# Patient Record
Sex: Male | Born: 1965 | Race: Black or African American | Hispanic: No | Marital: Married | Smoking: Former smoker
Health system: Southern US, Community
[De-identification: ages and names within clinical notes are randomized; demographics above are authoritative.]

## PROBLEM LIST (undated history)

## (undated) DIAGNOSIS — R51 Headache: Secondary | ICD-10-CM

## (undated) DIAGNOSIS — R519 Headache, unspecified: Secondary | ICD-10-CM

## (undated) DIAGNOSIS — C20 Malignant neoplasm of rectum: Secondary | ICD-10-CM

## (undated) DIAGNOSIS — M25512 Pain in left shoulder: Secondary | ICD-10-CM

## (undated) HISTORY — PX: COLON SURGERY: SHX602

## (undated) HISTORY — PX: ROTATOR CUFF REPAIR: SHX139

---

## 1998-11-12 ENCOUNTER — Encounter: Payer: Self-pay | Admitting: Internal Medicine

## 1998-11-12 ENCOUNTER — Ambulatory Visit (HOSPITAL_COMMUNITY): Admission: RE | Admit: 1998-11-12 | Discharge: 1998-11-12 | Payer: Self-pay | Admitting: Internal Medicine

## 2000-07-24 ENCOUNTER — Encounter: Payer: Self-pay | Admitting: Occupational Medicine

## 2000-07-24 ENCOUNTER — Encounter: Admission: RE | Admit: 2000-07-24 | Discharge: 2000-07-24 | Payer: Self-pay | Admitting: Occupational Medicine

## 2002-05-05 ENCOUNTER — Emergency Department (HOSPITAL_COMMUNITY): Admission: EM | Admit: 2002-05-05 | Discharge: 2002-05-05 | Payer: Self-pay | Admitting: Emergency Medicine

## 2002-05-08 ENCOUNTER — Emergency Department (HOSPITAL_COMMUNITY): Admission: EM | Admit: 2002-05-08 | Discharge: 2002-05-08 | Payer: Self-pay | Admitting: Emergency Medicine

## 2002-05-08 ENCOUNTER — Encounter: Payer: Self-pay | Admitting: Emergency Medicine

## 2002-05-09 ENCOUNTER — Emergency Department (HOSPITAL_COMMUNITY): Admission: EM | Admit: 2002-05-09 | Discharge: 2002-05-09 | Payer: Self-pay | Admitting: Emergency Medicine

## 2002-05-10 ENCOUNTER — Emergency Department (HOSPITAL_COMMUNITY): Admission: EM | Admit: 2002-05-10 | Discharge: 2002-05-10 | Payer: Self-pay | Admitting: Emergency Medicine

## 2002-05-12 ENCOUNTER — Emergency Department (HOSPITAL_COMMUNITY): Admission: EM | Admit: 2002-05-12 | Discharge: 2002-05-12 | Payer: Self-pay | Admitting: Emergency Medicine

## 2002-05-15 ENCOUNTER — Emergency Department (HOSPITAL_COMMUNITY): Admission: EM | Admit: 2002-05-15 | Discharge: 2002-05-15 | Payer: Self-pay | Admitting: Emergency Medicine

## 2004-11-11 ENCOUNTER — Emergency Department (HOSPITAL_COMMUNITY): Admission: EM | Admit: 2004-11-11 | Discharge: 2004-11-11 | Payer: Self-pay | Admitting: Family Medicine

## 2006-07-25 ENCOUNTER — Emergency Department (HOSPITAL_COMMUNITY): Admission: EM | Admit: 2006-07-25 | Discharge: 2006-07-25 | Payer: Self-pay | Admitting: Emergency Medicine

## 2006-09-21 ENCOUNTER — Ambulatory Visit (HOSPITAL_BASED_OUTPATIENT_CLINIC_OR_DEPARTMENT_OTHER): Admission: RE | Admit: 2006-09-21 | Discharge: 2006-09-21 | Payer: Self-pay | Admitting: Orthopedic Surgery

## 2007-04-22 ENCOUNTER — Emergency Department (HOSPITAL_COMMUNITY): Admission: EM | Admit: 2007-04-22 | Discharge: 2007-04-22 | Payer: Self-pay | Admitting: Emergency Medicine

## 2007-09-21 ENCOUNTER — Emergency Department (HOSPITAL_COMMUNITY): Admission: EM | Admit: 2007-09-21 | Discharge: 2007-09-21 | Payer: Self-pay | Admitting: Family Medicine

## 2008-08-10 ENCOUNTER — Emergency Department (HOSPITAL_COMMUNITY): Admission: EM | Admit: 2008-08-10 | Discharge: 2008-08-10 | Payer: Self-pay | Admitting: Emergency Medicine

## 2009-01-23 ENCOUNTER — Emergency Department (HOSPITAL_COMMUNITY): Admission: EM | Admit: 2009-01-23 | Discharge: 2009-01-23 | Payer: Self-pay | Admitting: Emergency Medicine

## 2010-10-25 ENCOUNTER — Encounter: Payer: Self-pay | Admitting: Internal Medicine

## 2010-10-25 ENCOUNTER — Ambulatory Visit: Payer: BC Managed Care – PPO | Admitting: Internal Medicine

## 2010-10-25 DIAGNOSIS — R634 Abnormal weight loss: Secondary | ICD-10-CM

## 2010-10-30 NOTE — Assessment & Plan Note (Signed)
Summary: check for diabetes/jbb   Vital Signs:  Patient Profile:   45 Years Old Male CC:      check for diabetes Height:     69 inches Weight:      166 pounds Pulse rate:   82 / minute Resp:     16 per minute BP sitting:   132 / 88  (left arm)  Pt. in pain?   no                   Updated Prior Medication List: No Medications Current Allergies (reviewed today): ! * SHELLFISHHistory of Present Illness Chief Complaint: check for diabetes History of Present Illness: on eye exam 10 years ago, patient was told he had signs of dm. he had an eye exam last year and dm wasn't mentioned. His family pushed him to get checked since he looks like he's lost weight. He thinks he was 220 lbs 1 yr ago but he is eating less and has more machines vs manual lifting at work. He drinks alot at work but not at home. No polyuria days but avgs nocturia x 3. Overall he feels well with good strength and energy. Works 3rd shift and sleep is often fragmented. Last ate sausage biscuit 6 hrs before cbg.  REVIEW OF SYSTEMS Constitutional Symptoms       Complains of weight loss.     Denies fever, chills, night sweats, weight gain, and fatigue.  Eyes       Denies change in vision, eye pain, eye discharge, glasses, contact lenses, and eye surgery. Ear/Nose/Throat/Mouth       Denies hearing loss/aids, change in hearing, ear pain, ear discharge, dizziness, frequent runny nose, frequent nose bleeds, sinus problems, sore throat, hoarseness, and tooth pain or bleeding.  Respiratory       Denies dry cough, productive cough, wheezing, shortness of breath, asthma, bronchitis, and emphysema/COPD.  Cardiovascular       Denies murmurs, chest pain, and tires easily with exhertion.    Gastrointestinal       Denies stomach pain, nausea/vomiting, diarrhea, constipation, blood in bowel movements, and indigestion. Genitourniary       Denies painful urination, kidney stones, and loss of urinary control. Neurological  Denies paralysis, seizures, and fainting/blackouts. Musculoskeletal       Denies muscle pain, joint pain, joint stiffness, decreased range of motion, redness, swelling, muscle weakness, and gout.  Skin       Denies bruising, unusual mles/lumps or sores, and hair/skin or nail changes.  Psych       Denies mood changes, temper/anger issues, anxiety/stress, speech problems, depression, and sleep problems.  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  Past Medical History: Unremarkable  Past Surgical History: rotator cuff repair 2008. 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 Occupation:  employed Smoking Status:  current Drug Use:  no Physical Exam General appearance: well developed, well nourished, good musculature. no acute distress Head: normocephalic, atraumatic Eyes: conjunctivae and lids normal Nasal: mucosa pink, nonedematous, no septal deviation, turbinates normal Oral/Pharynx: tongue normal, posterior pharynx without erythema or  exudate. good teeth Neck: neck supple,  trachea midline, no masses Thyroid: no nodules, masses, tenderness, or enlargement Chest/Lungs: no rales, wheezes, or rhonchi bilateral, breath sounds equal without effort Heart: regular rate and  rhythm, no murmur Abdomen: soft, non-tender without obvious organomegaly Extremities: normal extremities Neurological: grossly intact and non-focal Back: no tenderness over musculature, straight leg raises negative  bilaterally, deep tendon reflexes 2+ at achilles and patella Skin: no obvious rashes or lesions MSE: oriented to time, place, and person finger stick glucose 98. Assessment New Problems: WEIGHT LOSS (ICD-783.21) FAMILY HISTORY DIABETES 1ST DEGREE RELATIVE (ICD-V18.0)   The patient and/or caregiver has been counseled thoroughly with regard to medications prescribed including dosage, schedule, interactions, rationale for use, and possible side effects and they verbalize understanding.  Diagnoses and expected course of recovery discussed and will return if not improved as expected or if the condition worsens. Patient and/or caregiver verbalized understanding.   Patient Instructions: 1)  food diary. 2)  call here next week and confirm that send out lab is available for further blood work.  Orders Added: 1)  Capillary Blood Glucose/CBG [[id number]]
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
## 2011-05-28 LAB — GC/CHLAMYDIA PROBE AMP, GENITAL
Chlamydia, DNA Probe: NEGATIVE
GC Probe Amp, Genital: NEGATIVE

## 2011-05-28 LAB — POCT URINALYSIS DIP (DEVICE)
Bilirubin Urine: NEGATIVE
Glucose, UA: NEGATIVE
Hgb urine dipstick: NEGATIVE
Ketones, ur: NEGATIVE
Nitrite: NEGATIVE
Operator id: 126491
Protein, ur: NEGATIVE
Specific Gravity, Urine: 1.015
Urobilinogen, UA: 0.2
pH: 6

## 2011-05-28 LAB — URINE CULTURE
Colony Count: NO GROWTH
Culture: NO GROWTH

## 2011-06-23 LAB — I-STAT 8, (EC8 V) (CONVERTED LAB)
Glucose, Bld: 98
Potassium: 4.2
TCO2: 29
pCO2, Ven: 48.1
pH, Ven: 7.365 — ABNORMAL HIGH

## 2011-06-23 LAB — HEPATIC FUNCTION PANEL
ALT: 53
Albumin: 4.1
Indirect Bilirubin: 0.7
Total Protein: 7.3

## 2011-06-23 LAB — GAMMA GT: GGT: 38

## 2012-05-07 ENCOUNTER — Encounter (HOSPITAL_COMMUNITY): Payer: Self-pay | Admitting: Family Medicine

## 2012-05-07 ENCOUNTER — Emergency Department (HOSPITAL_COMMUNITY)
Admission: EM | Admit: 2012-05-07 | Discharge: 2012-05-07 | Disposition: A | Discharge status: Home / Self Care | Payer: BC Managed Care – PPO | Attending: Emergency Medicine | Admitting: Emergency Medicine

## 2012-05-07 ENCOUNTER — Emergency Department (HOSPITAL_COMMUNITY): Payer: BC Managed Care – PPO

## 2012-05-07 DIAGNOSIS — M25519 Pain in unspecified shoulder: Secondary | ICD-10-CM

## 2012-05-07 DIAGNOSIS — M25559 Pain in unspecified hip: Secondary | ICD-10-CM | POA: Insufficient documentation

## 2012-05-07 DIAGNOSIS — F17200 Nicotine dependence, unspecified, uncomplicated: Secondary | ICD-10-CM | POA: Insufficient documentation

## 2012-05-07 DIAGNOSIS — M549 Dorsalgia, unspecified: Secondary | ICD-10-CM

## 2012-05-07 DIAGNOSIS — M545 Low back pain, unspecified: Secondary | ICD-10-CM | POA: Insufficient documentation

## 2012-05-07 MED ORDER — TRAMADOL-ACETAMINOPHEN 37.5-325 MG PO TABS: ORAL_TABLET | ORAL | Status: AC

## 2012-05-07 MED ORDER — CYCLOBENZAPRINE HCL 10 MG PO TABS
ORAL_TABLET | ORAL | Status: AC
  Filled 2012-05-07: qty 1

## 2012-05-07 MED ORDER — METHOCARBAMOL 500 MG PO TABS: ORAL_TABLET | ORAL | Status: AC

## 2012-05-07 MED ORDER — IBUPROFEN 600 MG PO TABS: 600.0000 mg | ORAL_TABLET | Freq: Four times a day (QID) | ORAL | Status: AC | PRN

## 2012-05-07 MED ORDER — CYCLOBENZAPRINE HCL 10 MG PO TABS
10.0000 mg | ORAL_TABLET | Freq: Once | ORAL | Status: AC
  Administered 2012-05-07: 10 mg via ORAL

## 2012-05-07 MED ORDER — KETOROLAC TROMETHAMINE 60 MG/2ML IM SOLN
60.0000 mg | Freq: Once | INTRAMUSCULAR | Status: AC
  Administered 2012-05-07: 60 mg via INTRAMUSCULAR
  Filled 2012-05-07: qty 2
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
## 2013-08-08 ENCOUNTER — Ambulatory Visit: Payer: Self-pay | Admitting: Podiatry

## 2013-08-17 ENCOUNTER — Encounter: Payer: Self-pay | Admitting: Podiatry

## 2013-08-17 ENCOUNTER — Ambulatory Visit (INDEPENDENT_AMBULATORY_CARE_PROVIDER_SITE_OTHER): Payer: BC Managed Care – PPO | Admitting: Podiatry

## 2013-08-17 VITALS — BP 145/89 | HR 84 | Resp 12 | Ht 68.0 in | Wt 190.0 lb

## 2013-08-17 DIAGNOSIS — L84 Corns and callosities: Secondary | ICD-10-CM

## 2013-08-17 DIAGNOSIS — L259 Unspecified contact dermatitis, unspecified cause: Secondary | ICD-10-CM

## 2013-08-17 NOTE — Progress Notes (Signed)
   Subjective:    Patient ID: Barry [name], male    DOB: 05-04-1966, 47 y.o.   MRN: [id number]  HPI    Review of Systems  Allergic/Immunologic: Positive for food allergies.  All other systems reviewed and are negative.       Objective:   Physical Exam        Assessment & Plan:

## 2013-08-17 NOTE — Progress Notes (Signed)
Subjective:     Patient ID: Barry [name], male   DOB: [birth year], 47 y.o.   MRN: [id number]  HPI patient states that I have dry scaly feet of both feet and that I have these lesions on my right big toe and first metatarsal which become very tender   Review of Systems  All other systems reviewed and are negative.       Objective:   Physical Exam  Nursing note and vitals reviewed. Constitutional: He is oriented to person, place, and time.  Cardiovascular: Intact distal pulses.   Musculoskeletal: Normal range of motion.  Neurological: He is oriented to person, place, and time.  Skin: Skin is warm.   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    Plan:     Reviewed H&P and condition and recommended Vaseline with saran wrap and white socks along with revitaderm and today deep debridement of lesions accomplished with no iatrogenic bleeding noted reappoint her recheck in 3 and

## 2013-08-17 NOTE — Patient Instructions (Signed)
vaseline with saran wrap and white socks two nights a week and use revitaderm remaining nights

## 2013-08-17 NOTE — Progress Notes (Signed)
N- SORE, DRY AND THICK SKIN L-B/L FEET D-LONG TERM O-SLOWLY C-WORSE A-N/A T-SOAKING WITH EPSON SALT, LOTION

## 2013-08-19 DIAGNOSIS — L259 Unspecified contact dermatitis, unspecified cause: Secondary | ICD-10-CM

## 2013-10-17 ENCOUNTER — Ambulatory Visit: Payer: BC Managed Care – PPO | Admitting: Podiatry

## 2014-11-18 ENCOUNTER — Emergency Department (HOSPITAL_COMMUNITY)
Admission: EM | Admit: 2014-11-18 | Discharge: 2014-11-19 | Disposition: A | Discharge status: Home / Self Care | Payer: BLUE CROSS/BLUE SHIELD | Attending: Emergency Medicine | Admitting: Emergency Medicine

## 2014-11-18 ENCOUNTER — Emergency Department: Payer: Self-pay | Admitting: Emergency Medicine

## 2014-11-18 ENCOUNTER — Emergency Department (HOSPITAL_COMMUNITY): Payer: BLUE CROSS/BLUE SHIELD

## 2014-11-18 ENCOUNTER — Encounter (HOSPITAL_COMMUNITY): Payer: Self-pay

## 2014-11-18 DIAGNOSIS — R51 Headache: Secondary | ICD-10-CM | POA: Diagnosis not present

## 2014-11-18 DIAGNOSIS — Z720 Tobacco use: Secondary | ICD-10-CM | POA: Insufficient documentation

## 2014-11-18 DIAGNOSIS — R519 Headache, unspecified: Secondary | ICD-10-CM

## 2014-11-18 NOTE — ED Provider Notes (Signed)
CSN: [id number]     Arrival date & time 11/18/14  2103 History  This chart was scribed for Linton Flemings, MD by Chester Holstein, ED Scribe. This patient was seen in room A09C/A09C and the patient's care was started at 12:16 AM.    Chief Complaint  Patient presents with  . Headache    Patient is a 50 y.o. male presenting with headaches. The history is provided by the patient. No language interpreter was used.  Headache Associated symptoms: eye pain and fatigue   Associated symptoms: no dizziness, no fever and no weakness    HPI Comments: Barry [name] is a 49 y.o. male who presents to the Emergency Department complaining of intermittent left parietal headache with onset 2 days ago. Pt denies pain currently.  Pt notes associated burning left eye pain with watering and redness, drowsiness, and chills.  Pt has taken ibuprofen for relief. Pt is a smoker 1 ppd. Pt denies FHx of headaches. Pt denies known injury to left eye, fever, visual disturbances, weakness, and dizziness. Pt has no PCP.   No past medical history on file. Past Surgical History  Procedure Laterality Date  . Rotator cuff repair Left    No family history on file. History  Substance Use Topics  . Smoking status: Current Every Day Smoker    Types: Cigars  . Smokeless tobacco: Not on file  . Alcohol Use: Yes     Comment: 1 beer every day     Review of Systems  Constitutional: Positive for chills and fatigue. Negative for fever.  Eyes: Positive for pain, discharge (tearing) and redness. Negative for visual disturbance.  Neurological: Positive for headaches. Negative for dizziness and weakness.      Allergies  Shellfish allergy  Home Medications   Prior to Admission medications   Not on File   BP 125/95 mmHg  Pulse 88  Temp(Src) 98.3 F (36.8 C) (Oral)  Resp 22  Ht 5\' 8"  (1.727 m)  Wt 166 lb (75.297 kg)  BMI 25.25 kg/m2  SpO2 100% Physical Exam  Constitutional: He is oriented to person, place, and time. He  appears well-developed and well-nourished.  HENT:  Head: Normocephalic and atraumatic.  Nose: Nose normal.  Mouth/Throat: Oropharynx is clear and moist.  Eyes: Conjunctivae and EOM are normal. Pupils are equal, round, and reactive to light.  Neck: Normal range of motion. Neck supple. No JVD present. No tracheal deviation present. No thyromegaly present.  Cardiovascular: Normal rate, regular rhythm, normal heart sounds and intact distal pulses.  Exam reveals no gallop and no friction rub.   No murmur heard. Pulmonary/Chest: Effort normal and breath sounds normal. No stridor. No respiratory distress. He has no wheezes. He has no rales. He exhibits no tenderness.  Abdominal: Soft. Bowel sounds are normal. He exhibits no distension and no mass. There is no tenderness. There is no rebound and no guarding.  Musculoskeletal: Normal range of motion. He exhibits no edema or tenderness.  Lymphadenopathy:    He has no cervical adenopathy.  Neurological: He is alert and oriented to person, place, and time. He displays normal reflexes. No cranial nerve deficit. He exhibits normal muscle tone. Coordination normal.  Skin: Skin is warm and dry. No rash noted. No erythema. No pallor.  Psychiatric: He has a normal mood and affect. His behavior is normal. Judgment and thought content normal.  Nursing note and vitals reviewed.   ED Course  Procedures (including critical care time) DIAGNOSTIC STUDIES: Oxygen Saturation is 100% on  room air, normal by my interpretation.    COORDINATION OF CARE: 12:23 AM Discussed treatment plan with patient at beside, the patient agrees with the plan and has no further questions at this time.   Labs Review Labs Reviewed - No data to display  Imaging Review Ct Head Wo Contrast  11/18/2014   CLINICAL DATA:  Acute onset of headache.  Initial encounter.  EXAM: CT HEAD WITHOUT CONTRAST  TECHNIQUE: Contiguous axial images were obtained from the base of the skull through the  vertex without intravenous contrast.  COMPARISON:  None.  FINDINGS: There is no evidence of acute infarction, mass lesion, or intra- or extra-axial hemorrhage on CT.  The posterior fossa, including the cerebellum, brainstem and fourth ventricle, is within normal limits. The third and lateral ventricles, and basal ganglia are unremarkable in appearance. The cerebral hemispheres are symmetric in appearance, with normal gray-white differentiation. No mass effect or midline shift is seen.  There is no evidence of fracture; visualized osseous structures are unremarkable in appearance. The visualized portions of the orbits are within normal limits. The paranasal sinuses and mastoid air cells are well-aerated. No significant soft tissue abnormalities are seen.  IMPRESSION: Unremarkable noncontrast CT of the head.   Electronically Signed   By: Garald Balding M.D.   On: 11/18/2014 22:08     EKG Interpretation None      MDM   Final diagnoses:  Headache in back of head   49 year old male with intermittent headache for the last 3 days.  By his description, headaches may be cluster given.  The eye pain and eye watering.  Eye exam is normal without signs of lesions to indicate shingles.  Patient is currently symptom free.  We'll refer him to a primary care doctor.  Will prescribe Fioricet for use in the meantime. I personally performed the services described in this documentation, which was scribed in my presence. The recorded information has been reviewed and is accurate.      Linton Flemings, MD 11/19/14 [phone]

## 2014-11-18 NOTE — ED Notes (Signed)
Pt reports h/a for a few days,  Pt currently in no pain, no neurological deficits.  Pt alert and oriented.

## 2014-11-18 NOTE — ED Notes (Signed)
Onset 3 days intermittant left sided headache, blurred vision.  No other s/s noted.  Took IBU 800 mg @ 8pm.

## 2014-11-19 MED ORDER — BUTALBITAL-APAP-CAFFEINE 50-325-40 MG PO TABS: 1.0000 | ORAL_TABLET | Freq: Four times a day (QID) | ORAL | Status: AC | PRN

## 2014-11-19 NOTE — Discharge Instructions (Signed)
General Headache Without Cause A general headache is pain or discomfort felt around the head or neck area. The cause may not be found.  HOME CARE   Keep all doctor visits.  Only take medicines as told by your doctor.  Lie down in a dark, quiet room when you have a headache.  Keep a journal to find out if certain things bring on headaches. For example, write down:  What you eat and drink.  How much sleep you get.  Any change to your diet or medicines.  Relax by getting a massage or doing other relaxing activities.  Put ice or heat packs on the head and neck area as told by your doctor.  Lessen stress.  Sit up straight. Do not tighten (tense) your muscles.  Quit smoking if you smoke.  Lessen how much alcohol you drink.  Lessen how much caffeine you drink, or stop drinking caffeine.  Eat and sleep on a regular schedule.  Get 7 to 9 hours of sleep, or as told by your doctor.  Keep lights dim if bright lights bother you or make your headaches worse. GET HELP RIGHT AWAY IF:   Your headache becomes really bad.  You have a fever.  You have a stiff neck.  You have trouble seeing.  Your muscles are weak, or you lose muscle control.  You lose your balance or have trouble walking.  You feel like you will pass out (faint), or you pass out.  You have really bad symptoms that are different than your first symptoms.  You have problems with the medicines given to you by your doctor.  Your medicines do not work.  Your headache feels different than the other headaches.  You feel sick to your stomach (nauseous) or throw up (vomit). MAKE SURE YOU:   Understand these instructions.  Will watch your condition.  Will get help right away if you are not doing well or get worse. Document Released: 06/03/2008 Document Revised: 11/17/2011 Document Reviewed: 08/15/2011 [facility] Patient Information 2015 [place], [place]. This information is not intended to replace advice given to  you by your health care provider. Make sure you discuss any questions you have with your health care provider.  Headaches, Frequently Asked Questions MIGRAINE HEADACHES Q: What is migraine? What causes it? How can I treat it? A: Generally, migraine headaches begin as a dull ache. Then they develop into a constant, throbbing, and pulsating pain. You may experience pain at the temples. You may experience pain at the front or back of one or both sides of the head. The pain is usually accompanied by a combination of:  Nausea.  Vomiting.  Sensitivity to light and noise. Some people (about 15%) experience an aura (see below) before an attack. The cause of migraine is believed to be chemical reactions in the brain. Treatment for migraine may include over-the-counter or prescription medications. It may also include self-help techniques. These include relaxation training and biofeedback.  Q: What is an aura? A: About 15% of people with migraine get an "aura". This is a sign of neurological symptoms that occur before a migraine headache. You may see wavy or jagged lines, dots, or flashing lights. You might experience tunnel vision or blind spots in one or both eyes. The aura can include visual or auditory hallucinations (something imagined). It may include disruptions in smell (such as strange odors), taste or touch. Other symptoms include:  Numbness.  A "pins and needles" sensation.  Difficulty in recalling or speaking the  correct word. These neurological events may last as long as 60 minutes. These symptoms will fade as the headache begins. Q: What is a trigger? A: Certain physical or environmental factors can lead to or "trigger" a migraine. These include:  Foods.  Hormonal changes.  Weather.  Stress. It is important to remember that triggers are different for everyone. To help prevent migraine attacks, you need to figure out which triggers affect you. Keep a headache diary. This is a good  way to track triggers. The diary will help you talk to your healthcare professional about your condition. Q: Does weather affect migraines? A: Bright sunshine, hot, humid conditions, and drastic changes in barometric pressure may lead to, or "trigger," a migraine attack in some people. But studies have shown that weather does not act as a trigger for everyone with migraines. Q: What is the link between migraine and hormones? A: Hormones start and regulate many of your body's functions. Hormones keep your body in balance within a constantly changing environment. The levels of hormones in your body are unbalanced at times. Examples are during menstruation, pregnancy, or menopause. That can lead to a migraine attack. In fact, about three quarters of all women with migraine report that their attacks are related to the menstrual cycle.  Q: Is there an increased risk of stroke for migraine sufferers? A: The likelihood of a migraine attack causing a stroke is very remote. That is not to say that migraine sufferers cannot have a stroke associated with their migraines. In persons under age 48, the most common associated factor for stroke is migraine headache. But over the course of a person's normal life span, the occurrence of migraine headache may actually be associated with a reduced risk of dying from cerebrovascular disease due to stroke.  Q: What are acute medications for migraine? A: Acute medications are used to treat the pain of the headache after it has started. Examples over-the-counter medications, NSAIDs, ergots, and triptans.  Q: What are the triptans? A: Triptans are the newest class of abortive medications. They are specifically targeted to treat migraine. Triptans are vasoconstrictors. They moderate some chemical reactions in the brain. The triptans work on receptors in your brain. Triptans help to restore the balance of a neurotransmitter called serotonin. Fluctuations in levels of serotonin are  thought to be a main cause of migraine.  Q: Are over-the-counter medications for migraine effective? A: Over-the-counter, or "OTC," medications may be effective in relieving mild to moderate pain and associated symptoms of migraine. But you should see your caregiver before beginning any treatment regimen for migraine.  Q: What are preventive medications for migraine? A: Preventive medications for migraine are sometimes referred to as "prophylactic" treatments. They are used to reduce the frequency, severity, and length of migraine attacks. Examples of preventive medications include antiepileptic medications, antidepressants, beta-blockers, calcium channel blockers, and NSAIDs (nonsteroidal anti-inflammatory drugs). Q: Why are anticonvulsants used to treat migraine? A: During the past few years, there has been an increased interest in antiepileptic drugs for the prevention of migraine. They are sometimes referred to as "anticonvulsants". Both epilepsy and migraine may be caused by similar reactions in the brain.  Q: Why are antidepressants used to treat migraine? A: Antidepressants are typically used to treat people with depression. They may reduce migraine frequency by regulating chemical levels, such as serotonin, in the brain.  Q: What alternative therapies are used to treat migraine? A: The term "alternative therapies" is often used to describe treatments considered outside the  scope of conventional Western medicine. Examples of alternative therapy include acupuncture, acupressure, and yoga. Another common alternative treatment is herbal therapy. Some herbs are believed to relieve headache pain. Always discuss alternative therapies with your caregiver before proceeding. Some herbal products contain arsenic and other toxins. TENSION HEADACHES Q: What is a tension-type headache? What causes it? How can I treat it? A: Tension-type headaches occur randomly. They are often the result of temporary stress,  anxiety, fatigue, or anger. Symptoms include soreness in your temples, a tightening band-like sensation around your head (a "vice-like" ache). Symptoms can also include a pulling feeling, pressure sensations, and contracting head and neck muscles. The headache begins in your forehead, temples, or the back of your head and neck. Treatment for tension-type headache may include over-the-counter or prescription medications. Treatment may also include self-help techniques such as relaxation training and biofeedback. CLUSTER HEADACHES Q: What is a cluster headache? What causes it? How can I treat it? A: Cluster headache gets its name because the attacks come in groups. The pain arrives with little, if any, warning. It is usually on one side of the head. A tearing or bloodshot eye and a runny nose on the same side of the headache may also accompany the pain. Cluster headaches are believed to be caused by chemical reactions in the brain. They have been described as the most severe and intense of any headache type. Treatment for cluster headache includes prescription medication and oxygen. SINUS HEADACHES Q: What is a sinus headache? What causes it? How can I treat it? A: When a cavity in the bones of the face and skull (a sinus) becomes inflamed, the inflammation will cause localized pain. This condition is usually the result of an allergic reaction, a tumor, or an infection. If your headache is caused by a sinus blockage, such as an infection, you will probably have a fever. An x-ray will confirm a sinus blockage. Your caregiver's treatment might include antibiotics for the infection, as well as antihistamines or decongestants.  REBOUND HEADACHES Q: What is a rebound headache? What causes it? How can I treat it? A: A pattern of taking acute headache medications too often can lead to a condition known as "rebound headache." A pattern of taking too much headache medication includes taking it more than 2 days per  week or in excessive amounts. That means more than the label or a caregiver advises. With rebound headaches, your medications not only stop relieving pain, they actually begin to cause headaches. Doctors treat rebound headache by tapering the medication that is being overused. Sometimes your caregiver will gradually substitute a different type of treatment or medication. Stopping may be a challenge. Regularly overusing a medication increases the potential for serious side effects. Consult a caregiver if you regularly use headache medications more than 2 days per week or more than the label advises. ADDITIONAL QUESTIONS AND ANSWERS Q: What is biofeedback? A: Biofeedback is a self-help treatment. Biofeedback uses special equipment to monitor your body's involuntary physical responses. Biofeedback monitors:  Breathing.  Pulse.  Heart rate.  Temperature.  Muscle tension.  Brain activity. Biofeedback helps you refine and perfect your relaxation exercises. You learn to control the physical responses that are related to stress. Once the technique has been mastered, you do not need the equipment any more. Q: Are headaches hereditary? A: Four out of five (80%) of people that suffer report a family history of migraine. Scientists are not sure if this is genetic or a family predisposition. Despite  the uncertainty, a child has a 50% chance of having migraine if one parent suffers. The child has a 75% chance if both parents suffer.  Q: Can children get headaches? A: By the time they reach high school, most young people have experienced some type of headache. Many safe and effective approaches or medications can prevent a headache from occurring or stop it after it has begun.  Q: What type of doctor should I see to diagnose and treat my headache? A: Start with your primary caregiver. Discuss his or her experience and approach to headaches. Discuss methods of classification, diagnosis, and treatment. Your  caregiver may decide to recommend you to a headache specialist, depending upon your symptoms or other physical conditions. Having diabetes, allergies, etc., may require a more comprehensive and inclusive approach to your headache. The National Headache Foundation will provide, upon request, a list of [facility] physician members in your state. Document Released: 11/15/2003 Document Revised: 11/17/2011 Document Reviewed: 04/24/2008 [facility] Patient Information 2015 [place], [place]. This information is not intended to replace advice given to you by your health care provider. Make sure you discuss any questions you have with your health care provider.  Emergency Department Resource Guide 1) Find a Doctor and Pay Out of Pocket Although you won't have to find out who is covered by your insurance plan, it is a good idea to ask around and get recommendations. You will then need to call the office and see if the doctor you have chosen will accept you as a new patient and what types of options they offer for patients who are self-pay. Some doctors offer discounts or will set up payment plans for their patients who do not have insurance, but you will need to ask so you aren't surprised when you get to your appointment.  2) Contact Your Local Health Department Not all health departments have doctors that can see patients for sick visits, but many do, so it is worth a call to see if yours does. If you don't know where your local health department is, you can check in your phone book. The CDC also has a tool to help you locate your state's health department, and many state websites also have listings of all of their local health departments.  3) Find a [facility] If your illness is not likely to be very severe or complicated, you may want to try a walk in clinic. These are popping up all over the country in pharmacies, drugstores, and shopping centers. They're usually staffed by nurse practitioners or physician  assistants that have been trained to treat common illnesses and complaints. They're usually fairly quick and inexpensive. However, if you have serious medical issues or chronic medical problems, these are probably not your best option.  No Primary Care Doctor: - Call Health Connect at  [phone] - they can help you locate a primary care doctor that  accepts your insurance, provides certain services, etc. - Physician Referral Service- [phone]  Chronic Pain Problems: Organization         Address  Phone   Notes  [facility]  [phone] Patients need to be referred by their primary care doctor.   Medication Assistance: Organization         Address  Phone   Notes  [facility] Medication [facility] [address] [place], [place] [address] [phone] --Must be a resident of [facility] -- Must have NO insurance coverage whatsoever (no Medicaid/ Medicare,  etc.) -- The pt. MUST have a primary care doctor that directs their care regularly and follows them in the community   MedAssist  [phone]   [employer]  [phone]    Agencies that provide inexpensive medical care: Organization         Address  Phone   Notes  [place]  [phone]   [facility]    [phone]   [facility] [place], [place] [address] [phone]   [place] [address] [place] [phone]   Planned Parenthood    [phone]   [facility]    [phone]   [place] and [place] [address], [place] Phone:  [phone], Fax:  [phone] Hours of Operation:  9 am - 6 pm, M-F.  Also accepts Medicaid/Medicare and self-pay.  [facility] for [place] [address], [address], [place] Phone: [phone], Fax: [phone]. Hours of Operation:  8:30 am - 5:30 pm, M-F.  Also accepts  Medicaid and self-pay.  [facility] [place] [address] [place] Phone: [phone]   [place], [place], [place] [phone], Ext. [phone] Mondays & Thursdays: 7-9 AM.  First 15 patients are seen on a first come, first serve basis.    [place] Providers:  Organization         Address  Phone   Notes  [facility] [address],  [phone] Also accepts self-pay patients.  [facility] [address], [place]  [phone]   [place], [address], [place] [phone]   [facility] [address], [place] [phone]   Lucianne Lei [address], [place]   [phone] Only accepts [place] Access [place] patients after they have their name applied to their card.   Self-Pay (no insurance) in [facility]:  Organization         Address  Phone   Notes  Sickle Cell Patients, [facility] [place] [phone]   [facility] Urgent Care [place] [phone]   [facility] [place]  [place], [place], [place] [phone]   [facility]/Dr. Osei-Bonsu  [address], [place] or [address], [place] [phone] Phone number for both [place] and [place] locations is the same.  [facility] [address] [place] [phone]   [facility] [address], [place] or [address] [phone]   [facility] [address], [place] [phone], phone; [phone], fax Sees patients 1st and 3rd Saturday of every month.  Must not qualify for public or private insurance (i.e. Medicaid, Medicare, [place] Health Choice, Veterans' Benefits)  Household  income should be no more than 200% of the poverty level The clinic cannot treat you if you are pregnant or think you are pregnant  Sexually transmitted diseases are not treated at the clinic.    Dental Care: Organization         Address  Phone  Notes  [facility] [phone] Accepts children up to age 15 who are enrolled in [place] or [place]; pregnant women with a Medicaid card; and children who have applied for Medicaid or [place] Health Choice, but were declined, whose parents can pay a reduced fee at time of service.  [facility] Department of [facility]  [address], [place] [phone] Accepts children up to age 57 who are enrolled in [place] or [place]; pregnant women with a Medicaid card; and children who have applied for Medicaid or [place] Health Choice, but were declined, whose parents can pay a reduced fee at time of service.  [facility] Adult Dental Access PROGRAM  [place] [phone] Patients are seen by appointment only. Walk-ins are not accepted. [place] will see patients 57 years of age and older. Monday - Tuesday (8am-5pm) Most Wednesdays (8:30-5pm) $30 per visit, cash only  [facility] Adult Dental Access PROGRAM  [address], [facility] [phone] Patients are seen by appointment only. Walk-ins are not accepted. [place] will see patients 43 years of age and older. One Wednesday Evening (Monthly: Volunteer Based).  $30 per visit, cash only  [place]  [phone] for adults; Children under age 40, call Graduate Pediatric Dentistry at [phone]. Children aged 78-14, please call [phone] to request a pediatric application.  Dental services are provided in all areas of dental care including fillings, crowns and bridges, complete and partial dentures, implants, gum  treatment, root canals, and extractions. Preventive care is also provided. Treatment is provided to both adults and children. Patients are selected via a lottery and there is often a waiting list.   [facility] [address] [place]  [phone] [email or web address]   [facility] [address] [place], [place] [phone], Ext. [phone] Second and Fourth Thursday of each month, opens at 6:30 AM; Clinic ends at 9 AM.  Patients are seen on a first-come first-served basis, and a limited number are seen during each clinic.   [facility]  [address] Hillard Danker [place], [place] [phone]   Eligibility Requirements You must have lived in [place], [place], or [place] counties for at least the last three months.   You cannot be eligible for state or federal sponsored [employer], including [employer], [place], or [employer].   You generally cannot be eligible for healthcare insurance through your employer.    How to apply: Eligibility screenings are held every Tuesday and Wednesday afternoon from 1:00 pm until 4:00 pm. You do not need an appointment for the interview!  [facility] [address] [place], [place]   [place]  [place] Department  [place]  [phone]    Behavioral Health Resources in the Community: Intensive Outpatient Programs Organization         Address  Phone  Notes  [place]. [address] [place], [place] [phone]   [facility] Outpatient [address] [place], [place]   ADS: [facility] Drug [facility] [address] [place], [place]   [place] [address]  [place], [place] or [phone]   Substance Abuse Resources Organization         Address  Phone  Notes  Alcohol and  Drug Services  [place]  [phone]   [facility] [place]   Chinita Pester  [phone]   Residential & Outpatient Substance Abuse Program  [phone]   Psychological Services Organization         Address  Phone  Notes  [facility]  [place]  [phone]   [place] [address], [place] or [phone]    Mobile Crisis Teams Organization         Address  Phone  Notes  [facility], Mobile Crisis Care Unit  [phone]   [facility]  [address] [place], [place]   [place] [address] [place] [phone]    Self-Help/Support Groups Organization         Address  Phone             Notes  [place]. of [place] - variety of support groups  [place] Call for more information  Narcotics Anonymous (NA), [facility] [address], [employer] [place]  2 meetings at this location   Special educational needs teacher         Address  Phone  Notes  [facility] [place],    [place]  [phone]   [facility]  [address], [place] [phone], [place], [place]   Concepcion Hinckley, [place] [phone] Admissions: 8am-3pm M-F  Incentives Substance [place] [address]    [place], [place] [phone]   [facility] [address] [place], [place], [place]   [facility] [address],  [place], [place]   [facility] - Intensive Outpatient [address] Kristeen [address], [place], [place]   [facility] [place].,  [place], [place] [phone] or [phone]   [facility] Services (RTS) [address] [place], [place] Accepts Medicaid  [facility]ship  [place] [address]  [place] [phone] Substance Abuse/Addiction Treatment   [facility] Organization         Address  Phone  Notes  [facility]  [phone]   Domenic Schwab, PhD [address] Arlis Porta [place], [place]   [phone] or [phone]   [place], [place] [phone]   [facility] [address] [place], [place] [phone] Insurance/Medicaid/sponsorship through [facility] [address] [place]                                    [place], [place] [phone] [place] [address], [place] [phone]    Dr. Adele Schilder  [phone]   [facility] [place] Dept. 1) [address] [place] 2) [place] 3)  [place] [address], [place] [phone]  [phone]   [place] [phone] or [phone] (After Hours)

## 2014-11-19 NOTE — ED Notes (Signed)
Pt made aware to return if symptoms worsen or if any life threatening symptoms occur.   

## 2015-01-15 ENCOUNTER — Encounter (HOSPITAL_COMMUNITY): Payer: Self-pay

## 2015-01-15 ENCOUNTER — Emergency Department (HOSPITAL_COMMUNITY): Payer: BLUE CROSS/BLUE SHIELD

## 2015-01-15 ENCOUNTER — Emergency Department (HOSPITAL_COMMUNITY)
Admission: EM | Admit: 2015-01-15 | Discharge: 2015-01-15 | Disposition: A | Discharge status: Home / Self Care | Payer: BLUE CROSS/BLUE SHIELD | Attending: Emergency Medicine | Admitting: Emergency Medicine

## 2015-01-15 DIAGNOSIS — X58XXXA Exposure to other specified factors, initial encounter: Secondary | ICD-10-CM | POA: Insufficient documentation

## 2015-01-15 DIAGNOSIS — Y9389 Activity, other specified: Secondary | ICD-10-CM | POA: Diagnosis not present

## 2015-01-15 DIAGNOSIS — T184XXA Foreign body in colon, initial encounter: Secondary | ICD-10-CM

## 2015-01-15 DIAGNOSIS — R109 Unspecified abdominal pain: Secondary | ICD-10-CM

## 2015-01-15 DIAGNOSIS — Z8739 Personal history of other diseases of the musculoskeletal system and connective tissue: Secondary | ICD-10-CM | POA: Insufficient documentation

## 2015-01-15 DIAGNOSIS — Y998 Other external cause status: Secondary | ICD-10-CM | POA: Insufficient documentation

## 2015-01-15 DIAGNOSIS — R1084 Generalized abdominal pain: Secondary | ICD-10-CM | POA: Diagnosis present

## 2015-01-15 DIAGNOSIS — Z720 Tobacco use: Secondary | ICD-10-CM | POA: Diagnosis not present

## 2015-01-15 DIAGNOSIS — Y9289 Other specified places as the place of occurrence of the external cause: Secondary | ICD-10-CM | POA: Insufficient documentation

## 2015-01-15 HISTORY — DX: Headache: R51

## 2015-01-15 HISTORY — DX: Pain in left shoulder: M25.512

## 2015-01-15 HISTORY — DX: Headache, unspecified: R51.9

## 2015-01-15 LAB — CBC WITH DIFFERENTIAL/PLATELET
BASOS PCT: 0 % (ref 0–1)
Basophils Absolute: 0 10*3/uL (ref 0.0–0.1)
EOS ABS: 0.1 10*3/uL (ref 0.0–0.7)
EOS PCT: 2 % (ref 0–5)
HCT: 41.4 % (ref 39.0–52.0)
HEMOGLOBIN: 14.5 g/dL (ref 13.0–17.0)
Lymphocytes Relative: 39 % (ref 12–46)
Lymphs Abs: 2.1 10*3/uL (ref 0.7–4.0)
MCH: 31 pg (ref 26.0–34.0)
MCHC: 35 g/dL (ref 30.0–36.0)
MCV: 88.5 fL (ref 78.0–100.0)
MONOS PCT: 10 % (ref 3–12)
Monocytes Absolute: 0.5 10*3/uL (ref 0.1–1.0)
NEUTROS PCT: 49 % (ref 43–77)
Neutro Abs: 2.6 10*3/uL (ref 1.7–7.7)
Platelets: 151 10*3/uL (ref 150–400)
RBC: 4.68 MIL/uL (ref 4.22–5.81)
RDW: 13.5 % (ref 11.5–15.5)
WBC: 5.4 10*3/uL (ref 4.0–10.5)

## 2015-01-15 LAB — URINALYSIS W MICROSCOPIC (NOT AT ARMC)
Bilirubin Urine: NEGATIVE
Glucose, UA: NEGATIVE mg/dL
Hgb urine dipstick: NEGATIVE
KETONES UR: NEGATIVE mg/dL
LEUKOCYTES UA: NEGATIVE
NITRITE: NEGATIVE
PROTEIN: NEGATIVE mg/dL
Specific Gravity, Urine: 1.014 (ref 1.005–1.030)
UROBILINOGEN UA: 0.2 mg/dL (ref 0.0–1.0)
pH: 6 (ref 5.0–8.0)

## 2015-01-15 LAB — COMPREHENSIVE METABOLIC PANEL
ALK PHOS: 80 U/L (ref 38–126)
ALT: 34 U/L (ref 17–63)
AST: 22 U/L (ref 15–41)
Albumin: 4.1 g/dL (ref 3.5–5.0)
Anion gap: 3 — ABNORMAL LOW (ref 5–15)
BUN: 12 mg/dL (ref 6–20)
CALCIUM: 8.9 mg/dL (ref 8.9–10.3)
CHLORIDE: 109 mmol/L (ref 101–111)
CO2: 27 mmol/L (ref 22–32)
CREATININE: 1.01 mg/dL (ref 0.61–1.24)
GLUCOSE: 94 mg/dL (ref 70–99)
Potassium: 3.7 mmol/L (ref 3.5–5.1)
Sodium: 139 mmol/L (ref 135–145)
Total Bilirubin: 0.5 mg/dL (ref 0.3–1.2)
Total Protein: 7.3 g/dL (ref 6.5–8.1)

## 2015-01-15 LAB — LIPASE, BLOOD: LIPASE: 19 U/L — AB (ref 22–51)

## 2015-01-15 MED ORDER — MORPHINE SULFATE 4 MG/ML IJ SOLN
4.0000 mg | INTRAMUSCULAR | Status: DC | PRN
  Filled 2015-01-15: qty 1

## 2015-01-15 MED ORDER — IOHEXOL 300 MG/ML  SOLN
50.0000 mL | Freq: Once | INTRAMUSCULAR | Status: AC | PRN
  Administered 2015-01-15: 50 mL via ORAL

## 2015-01-15 MED ORDER — ONDANSETRON HCL 4 MG PO TABS: 4.0000 mg | ORAL_TABLET | Freq: Three times a day (TID) | ORAL | Status: DC | PRN

## 2015-01-15 MED ORDER — IOHEXOL 300 MG/ML  SOLN
100.0000 mL | Freq: Once | INTRAMUSCULAR | Status: AC | PRN
  Administered 2015-01-15: 100 mL via INTRAVENOUS

## 2015-01-15 MED ORDER — SODIUM CHLORIDE 0.9 % IV SOLN
INTRAVENOUS | Status: DC
  Administered 2015-01-15: 09:00:00 via INTRAVENOUS

## 2015-01-15 MED ORDER — ONDANSETRON HCL 4 MG/2ML IJ SOLN
4.0000 mg | INTRAMUSCULAR | Status: DC | PRN
  Filled 2015-01-15: qty 2

## 2015-01-15 NOTE — Consult Note (Signed)
Reason for Consult:  Abdominal pain  Referring Physician: Dr. Francine Graven  JAHAAN [name] is an 49 y.o. male.  HPI:  49 y/o with nausea and vomiting for 24 hours, comes to the Ed, no prior medical issues or history.  Labs OK, afebrile, VSS, CT scan show:  12 mm long foreign body (possibly ingested bone) in the sigmoid colonic wall and pericolonic fat. Given absence of inflammatory change, this may be chronic.  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 Recommend further evaluation with liver protocol MRI.  Past Medical History  Diagnosis Date  . Headache   . Left shoulder pain     Past Surgical History  Procedure Laterality Date  . Rotator cuff repair Left     History reviewed. No pertinent family history.  Social History:  reports that he has been smoking Cigars.  He does not have any smokeless tobacco history on file. He reports that he drinks alcohol. He reports that he does not use illicit drugs.  Allergies:  Allergies  Allergen Reactions  . Shellfish Allergy Swelling    Prior to Admission medications   Medication Sig Start Date End Date Taking? Authorizing Provider  butalbital-acetaminophen-caffeine (FIORICET) 50-325-40 MG per tablet Take 1-2 tablets by mouth every 6 (six) hours as needed for headache. Patient not taking: Reported on 01/15/2015 11/19/14 11/19/15  Linton Flemings, MD     Results for orders placed or performed during the hospital encounter of 01/15/15 (from the past 48 hour(s))  CBC with Differential     Status: None   Collection Time: 01/15/15  8:03 AM  Result Value Ref Range   WBC 5.4 4.0 - 10.5 K/uL   RBC 4.68 4.22 - 5.81 MIL/uL   Hemoglobin 14.5 13.0 - 17.0 g/dL   HCT 41.4 39.0 - 52.0 %   MCV 88.5 78.0 - 100.0 fL   MCH 31.0 26.0 - 34.0 pg   MCHC 35.0 30.0 - 36.0 g/dL   RDW 13.5 11.5 - 15.5 %   Platelets 151 150 - 400 K/uL   Neutrophils Relative % 49 43 - 77 %   Neutro Abs 2.6 1.7 - 7.7 K/uL    Lymphocytes Relative 39 12 - 46 %   Lymphs Abs 2.1 0.7 - 4.0 K/uL   Monocytes Relative 10 3 - 12 %   Monocytes Absolute 0.5 0.1 - 1.0 K/uL   Eosinophils Relative 2 0 - 5 %   Eosinophils Absolute 0.1 0.0 - 0.7 K/uL   Basophils Relative 0 0 - 1 %   Basophils Absolute 0.0 0.0 - 0.1 K/uL  Comprehensive metabolic panel     Status: Abnormal   Collection Time: 01/15/15  8:03 AM  Result Value Ref Range   Sodium 139 135 - 145 mmol/L   Potassium 3.7 3.5 - 5.1 mmol/L   Chloride 109 101 - 111 mmol/L   CO2 27 22 - 32 mmol/L   Glucose, Bld 94 70 - 99 mg/dL   BUN 12 6 - 20 mg/dL   Creatinine, Ser 1.01 0.61 - 1.24 mg/dL   Calcium 8.9 8.9 - 10.3 mg/dL   Total Protein 7.3 6.5 - 8.1 g/dL   Albumin 4.1 3.5 - 5.0 g/dL   AST 22 15 - 41 U/L   ALT 34 17 - 63 U/L   Alkaline Phosphatase 80 38 - 126 U/L   Total Bilirubin 0.5 0.3 - 1.2 mg/dL   GFR calc non Af Amer >60 >60 mL/min  GFR calc Af Amer >60 >60 mL/min    Comment: (NOTE) The eGFR has been calculated using the CKD EPI equation. This calculation has not been validated in all clinical situations. eGFR's persistently <60 mL/min signify possible Chronic Kidney Disease.    Anion gap 3 (L) 5 - 15  Lipase, blood     Status: Abnormal   Collection Time: 01/15/15  8:03 AM  Result Value Ref Range   Lipase 19 (L) 22 - 51 U/L  Urinalysis with microscopic     Status: None   Collection Time: 01/15/15  8:08 AM  Result Value Ref Range   Color, Urine YELLOW YELLOW   APPearance CLEAR CLEAR   Specific Gravity, Urine 1.014 1.005 - 1.030   pH 6.0 5.0 - 8.0   Glucose, UA NEGATIVE NEGATIVE mg/dL   Hgb urine dipstick NEGATIVE NEGATIVE   Bilirubin Urine NEGATIVE NEGATIVE   Ketones, ur NEGATIVE NEGATIVE mg/dL   Protein, ur NEGATIVE NEGATIVE mg/dL   Urobilinogen, UA 0.2 0.0 - 1.0 mg/dL   Nitrite NEGATIVE NEGATIVE   Leukocytes, UA NEGATIVE NEGATIVE   WBC, UA 0-2 <3 WBC/hpf   RBC / HPF 0-2 <3 RBC/hpf   Bacteria, UA RARE RARE   Squamous Epithelial / LPF RARE  RARE   Urine-Other MUCOUS PRESENT     Ct Abdomen Pelvis W Contrast  01/15/2015   CLINICAL DATA:  Abdominal pain and nausea  EXAM: CT ABDOMEN AND PELVIS WITH CONTRAST  TECHNIQUE: Multidetector CT imaging of the abdomen and pelvis was performed using the standard protocol following bolus administration of intravenous contrast.  CONTRAST:  154m OMNIPAQUE IOHEXOL 300 MG/ML SOLN, 555mOMNIPAQUE IOHEXOL 300 MG/ML SOLN  COMPARISON:  None.  FINDINGS: BODY WALL: No contributory findings.  LOWER CHEST: No contributory findings.  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 There are no morphologic changes of cirrhosis.  Biliary: No evidence of biliary obstruction or stone.  Pancreas: Unremarkable.  Spleen: Unremarkable.  Adrenals: Unremarkable.  Kidneys and ureters: No hydronephrosis or stone.  Bladder: Unremarkable.  Reproductive: No pathologic findings.  Bowel: 12 mm long high-density structure in the wall and pericolonic fat of the sigmoid colon on image 62. Density is closest to bone, and this may reflect a ingested animal bone. Despite the wall penetration or perforation, there is no surrounding inflammatory change to suggest acuity. Normal appendix. No bowel obstruction.  Retroperitoneum: No mass or adenopathy.  Peritoneum: No ascites or pneumoperitoneum.  Vascular: Age advanced diffuse arterial calcification  OSSEOUS: No acute abnormalities.  IMPRESSION: 12 mm long foreign body (possibly ingested bone) in the sigmoid colonic wall and pericolonic fat. Given absence of inflammatory change, this may be chronic.  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 Recommend further evaluation with liver protocol MRI.   Electronically Signed   By: JoMonte Fantasia.D.   On: 01/15/2015 09:46    Review of Systems   Constitutional: Negative.   HENT: Negative.   Eyes: Negative.   Respiratory: Negative.   Gastrointestinal: Positive for nausea and abdominal pain. Negative for heartburn, vomiting, diarrhea, constipation, blood in stool and melena.       Last BM yesterday and normal.  Genitourinary: Negative.   Musculoskeletal: Negative.   Skin: Negative.   Neurological: Negative.   Endo/Heme/Allergies: Negative.   Psychiatric/Behavioral: Negative.    Blood pressure 130/88, pulse 68, temperature 98.4 F (  36.9 C), temperature source Oral, resp. rate 16, SpO2 100 %. Physical Exam  Constitutional: He is oriented to person, place, and time. He appears well-developed and well-nourished. No distress.  HENT:  Head: Normocephalic and atraumatic.  Nose: Nose normal.  Eyes: Conjunctivae and EOM are normal. Right eye exhibits no discharge. Left eye exhibits no discharge. No scleral icterus.  Neck: Normal range of motion. Neck supple. No JVD present. No tracheal deviation present. No thyromegaly present.  Cardiovascular: Normal rate, regular rhythm, normal heart sounds and intact distal pulses.   No murmur heard. Respiratory: Effort normal and breath sounds normal. No respiratory distress. He has no wheezes. He has no rales. He exhibits no tenderness.  GI: Soft. Bowel sounds are normal. He exhibits no distension and no mass. There is tenderness (some below the umbuilicus, both sides are the same, and he wants to know if he can eat.). There is no rebound and no guarding.  Pain has been below the umbilicus, both left and right sides.  One side is not more severe than the other.  Musculoskeletal: He exhibits no edema or tenderness.  Lymphadenopathy:    He has no cervical adenopathy.  Neurological: He is alert and oriented to person, place, and time. No cranial nerve deficit.  Skin: Skin is warm and dry. No rash noted. He is not diaphoretic. No erythema. No pallor.  Psychiatric: He has a normal mood and affect. His  behavior is normal. Judgment and thought content normal.    Assessment/Plan: 1.  Abdominal pain.   2.  12 mm calcified area possibly ingested bone in the sigmoid that appear chronic. No inflammatory changes.  Plan:  I will defer to ED.  Dr. Hassell Done has reviewed the CT and with no other abnormalities, it is his opinion that there is no surgical need at this time.  Danity Schmelzer 01/15/2015, 10:54 AM

## 2015-01-15 NOTE — Discharge Instructions (Signed)
°Emergency Department Resource Guide °1) Find a Doctor and Pay Out of Pocket °Although you won't have to find out who is covered by your insurance plan, it is a good idea to ask around and get recommendations. You will then need to call the office and see if the doctor you have chosen will accept you as a new patient and what types of options they offer for patients who are self-pay. Some doctors offer discounts or will set up payment plans for their patients who do not have insurance, but you will need to ask so you aren't surprised when you get to your appointment. ° °2) Contact Your Local Health Department °Not all health departments have doctors that can see patients for sick visits, but many do, so it is worth a call to see if yours does. If you don't know where your local health department is, you can check in your phone book. The CDC also has a tool to help you locate your state's health department, and many state websites also have listings of all of their local health departments. ° °3) Find a Walk-in Clinic °If your illness is not likely to be very severe or complicated, you may want to try a walk in clinic. These are popping up all over the country in pharmacies, drugstores, and shopping centers. They're usually staffed by nurse practitioners or physician assistants that have been trained to treat common illnesses and complaints. They're usually fairly quick and inexpensive. However, if you have serious medical issues or chronic medical problems, these are probably not your best option. ° °No Primary Care Doctor: °- Call [facility] at  832-8000 - they can help you locate a primary care doctor that  accepts your insurance, provides certain services, etc. °- Physician Referral Service- 1-800-533-3463 ° °Chronic Pain Problems: °Organization         Address  Phone   Notes  °[facility]  ([phone]) [phone] Patients need to be referred by their primary care doctor.  ° °Medication  Assistance: °Organization         Address  Phone   Notes  °[place] [facility] [address] °[place], [place] [address] (336) 641-8030 --Must be a resident of [place] °-- Must have NO insurance coverage whatsoever (no Medicaid/ Medicare, etc.) °-- The pt. MUST have a primary care doctor that directs their care regularly and follows them in the community °  °[facility]  (866) 331-1348   °[employer]  (888) 892-1162   ° °Agencies that provide inexpensive medical care: °Organization         Address  Phone   Notes  °[facility]  (336) 832-8035   °[facility]    (336) 832-7272   °[facility] [address] °[place], [place] [address] (336) 832-4777   °[facility] [address], °[place] (336) 271-4999   °[facility]    (336) 373-0678   °[facility]    (336) 272-1050   °[facility] ° [address], [place] Phone:  (336) 832-4444, Fax:  (336) 832-4440 Hours of Operation:  9 am - 6 pm, M-F.  Also accepts Medicaid/Medicare and self-pay.  °[facility] ° [address], [place] Phone: (336) [phone], Fax: (336) [phone]. Hours of Operation:  8:30 am - 5:30 pm, M-F.  Also accepts Medicaid and self-pay.  °[facility] [place] [address], [place] Phone: (336) 878-6027   °[facility] [address], [place], [place] (336)723-1848, Ext. 123 Mondays & Thursdays: 7-9 AM.  First 15 patients are seen on a first come, first serve basis. °  ° °Medicaid-accepting [place] Providers: ° °Organization         Address  Phone   Notes  °[facility] [address], [place] (336) [phone] Also accepts self-pay patients.  °[facility] [address], [place] ° (336) [phone]   °[facility] [address], [place]  (336) 288-8857   °[facility] [address] [place] [address], [place] (336) 299-7000   °Ve[facility] [address], [place]  ° (336) 373-1557 Only accepts [place] Access Medicaid patients after they have their name applied to their card.  ° °Self-Pay (no insurance) in [place]: ° °Organization         Address  Phone   Notes  °Sickle Cell Patients, [facility] [address], [place] (336) 832-1970   °[facility] [address], [place] (336) 832-4400   °[facility] ° [address], [place] (336) 992-4800   °[facility]/Dr. Osei-Bonsu ° [address] [place] [address], [place] or [address], [place] (336) [phone] Phone number for both [place] and [place] locations is the same.  °[facility] [address], [place] (336) 299-0000   °[facility] [address] [place] [address], [place] or [address] (336) 852-7530 °(336) 878-2260   °[facility] [address],  (336) 350-1642, phone; (336) 294-5005, fax Sees patients 1st and 3rd Saturday of every month.  Must not qualify for public or private insurance (i.e. Medicaid, Medicare, [place] Health Choice, Veterans' Benefits) • Household income should be no more than 200% of the poverty level •The clinic cannot treat you if you are pregnant or think you are pregnant • Sexually transmitted diseases are not treated at the clinic.  ° ° °Dental Care: °Organization         Address  Phone  Notes  °[place] [facility] [address],  (336) 641-6152 Accepts children up to age 21 who are enrolled in Medicaid or [place] Health Choice; pregnant women with a Medicaid card; and children who have applied for Medicaid or [place] Health Choice, but were declined, whose parents can pay a reduced fee at time of service.  °[place]  [facility] [place]  [address], [place] (336) 641-7733 Accepts children up to age 21 who are enrolled in Medicaid or [place] Health Choice; pregnant women with a Medicaid card; and children who have applied for Medicaid or  Health Choice, but were declined, whose parents can pay a reduced fee at time of service.  °[facility] ° [address],  (336) 641-4533 Patients are seen by appointment only. Walk-ins are not accepted. [facility] will see patients 18 years of age and older. °Monday - Tuesday (8am-5pm) °Most Wednesdays (8:30-5pm) °$30 per visit, cash only  °[facility] ° [address], [place] (336) [phone] Patients are seen by appointment only. Walk-ins are not accepted. [facility] will see patients 18 years of age and older. °One   Wednesday Evening (Monthly: Volunteer Based).  $30 per visit, cash only  °[facility]  (919) 537-3737 for adults; Children under age 4, call Graduate Pediatric Dentistry at ([phone]) [phone]. Children aged 4-14, please call ([phone]) [phone] to request a pediatric application. ° Dental services are provided in all areas of dental care including fillings, crowns and bridges, complete and partial dentures, implants, gum treatment, root canals, and extractions. Preventive care is also provided. Treatment is provided to both adults and children. °Patients are selected via a lottery and there is often a waiting list. °  °[facility] [address], °[place] ° (336) 763-8833 [email or web address] °  °[facility] [address], [place], [place] (336)723-1848, Ext. 123 Second and Fourth Thursday of each month, opens at 6:30 AM; Clinic ends at 9 AM.  Patients are seen on a first-come first-served basis, and a limited number are seen during each clinic.  ° °[facility] ° [address] [place] [address], [place], [place] (336) [phone]    Eligibility Requirements °You must have lived in [place], [place], or [place] counties for at least the last three months. °  You cannot be eligible for state or federal sponsored healthcare insurance, including Veterans Administration, Medicaid, or Medicare. °  You generally cannot be eligible for healthcare insurance through your employer.  °  How to apply: °Eligibility screenings are held every Tuesday and Wednesday afternoon from 1:00 pm until 4:00 pm. You do not need an appointment for the interview!  °[facility] [address], [place], [place] 336-631-2330   °[facility]  336-342-8273   °[facility]  336-703-3100   °[facility]  336-570-6415   ° °Behavioral Health Resources in the Community: °Intensive Outpatient Programs °Organization         Address  Phone  Notes  °[facility] [address], [place], [place] 336-878-6098   °[facility] [address], [place], [place] 336-832-9800   °[facility] [address], [place], [place] ° 336-882-2125   °[facility] [address],  °[place], [place] 1-800-853-5163 or 336-641-4981   °Substance Abuse Resources °Organization         Address  Phone  Notes  °[facility]  336-882-2125   °[facility]  336-784-9470   °[facility]  336-285-9073   °[facility]  336-845-3988   °Residential & Outpatient Substance Abuse Program  1-800-659-3381   °Psychological Services °Organization         Address  Phone  Notes  °[facility]  336- 832-9600   °[facility]  336- 378-7881   °[facility] [address], [place] 1-800-853-5163 or 336-641-4981   ° °Mobile Crisis Teams °Organization         Address  Phone  Notes  °[facility], Mobile Crisis Care Unit  1-877-626-1772   °Assertive °[facility] ° [address]  [place], [place] 336-834-9664   °Sharon DeEsch [address] °[place] 336-554-5454   ° °Self-Help/Support Groups °Organization         Address  Phone             Notes  °[facility]  - variety of support groups  336- 373-1402 Call for more information  °Narcotics Anonymous (NA), [facility] [address], °[place]  2 meetings at this location  ° °  Residential Treatment Programs Organization         Address  Phone  Notes  [facility] [address],    [place]  1-(878)873-9587   [facility]  [address], [place] [address], [place], [place]   [place], [place] (831)044-0580 Admissions: 8am-3pm M-F  [facility] [place] [address]    [place], [place] 502-774-1287   [facility] [address] [place], [place], [place]   [facility] [address]  [place], [place]   [facility] Intensive Out[address], [place], [place]   [facility] ([place].,  [place], [place] 1-(272) 078-6013 or (267) 695-9293   [facility] [address] [place], [place] Accepts Medicaid  [facility] [address]  [place] 1-2134460002 Substance Abuse/Addiction Treatment   [facility]         Address  Phone  Notes  [facility]  (310)464-5150   Domenic Schwab, PhD [address] Arlis Porta [place], [place]   (330)306-1011 or 984-445-7138   [place], [place] 636-157-7271   [facility] [address] [place], [place] [phone] Insurance/Medicaid/sponsorship through [facility] [address]                                    [place], [place] [phone] [place] [address], [place] (503)[phone]    Dr. Adele Schilder  (805)[phone]   [facility] [place] Dept. 1) [address] [place] 2) [place] 3)  [address], [place] [phone] [phone]  [phone]   [place] 4066320435 or [phone] (After Hours)      Take the prescription as directed.  Call your regular medical doctor and the GI doctor today to schedule a follow up appointment within the next week.  Return to the Emergency Department immediately sooner if worsening.

## 2015-01-15 NOTE — ED Provider Notes (Signed)
CSN: [id number]     Arrival date & time 01/15/15  8938 History   First MD Initiated Contact with Patient 01/15/15 [phone]     Chief Complaint  Patient presents with  . Abdominal Pain  . Nausea      HPI Pt was seen at 0750.  Per pt, c/o gradual onset and persistence of constant generalized abd "pain" since yesterday.  Has been associated with nausea.  Describes the abd pain as "cramping."  Pt states he has experienced similar pain previously but never sought medical evaluation. Denies vomiting/diarrhea, no fevers, no back pain, no rash, no CP/SOB, no black or blood in stools.       Past Medical History  Diagnosis Date  . Headache   . Left shoulder pain    Past Surgical History  Procedure Laterality Date  . Rotator cuff repair Left     History  Substance Use Topics  . Smoking status: Current Every Day Smoker    Types: Cigars  . Smokeless tobacco: Not on file  . Alcohol Use: Yes     Comment: 1 beer every day     Review of Systems ROS: Statement: All systems negative except as marked or noted in the HPI; Constitutional: Negative for fever and chills. ; ; Eyes: Negative for eye pain, redness and discharge. ; ; ENMT: Negative for ear pain, hoarseness, nasal congestion, sinus pressure and sore throat. ; ; Cardiovascular: Negative for chest pain, palpitations, diaphoresis, dyspnea and peripheral edema. ; ; Respiratory: Negative for cough, wheezing and stridor. ; ; Gastrointestinal: +abd pain, nausea. Negative for vomiting, diarrhea, blood in stool, hematemesis, jaundice and rectal bleeding. . ; ; Genitourinary: Negative for dysuria, flank pain and hematuria. ; ; Musculoskeletal: Negative for back pain and neck pain. Negative for swelling and trauma.; ; Skin: Negative for pruritus, rash, abrasions, blisters, bruising and skin lesion.; ; Neuro: Negative for headache, lightheadedness and neck stiffness. Negative for weakness, altered level of consciousness , altered mental status, extremity  weakness, paresthesias, involuntary movement, seizure and syncope.       Allergies  Shellfish allergy  Home Medications   Prior to Admission medications   Medication Sig Start Date End Date Taking? Authorizing Provider  butalbital-acetaminophen-caffeine (FIORICET) 50-325-40 MG per tablet Take 1-2 tablets by mouth every 6 (six) hours as needed for headache. 11/19/14 11/19/15  Linton Flemings, MD   BP 135/90 mmHg  Pulse 79  Temp(Src) 98.1 F (36.7 C) (Oral)  Resp 18  SpO2 100% Physical Exam  0755: Physical examination:  Nursing notes reviewed; Vital signs and O2 SAT reviewed;  Constitutional: Well developed, Well nourished, Well hydrated, In no acute distress; Head:  Normocephalic, atraumatic; Eyes: EOMI, PERRL, No scleral icterus; ENMT: Mouth and pharynx normal, Mucous membranes moist; Neck: Supple, Full range of motion, No lymphadenopathy; Cardiovascular: Regular rate and rhythm, No murmur, rub, or gallop; Respiratory: Breath sounds clear & equal bilaterally, No rales, rhonchi, wheezes.  Speaking full sentences with ease, Normal respiratory effort/excursion; Chest: Nontender, Movement normal; Abdomen: Soft, +RLQ, LLQ > diffuse tenderness to palp. No rebound. Nondistended, Normal bowel sounds; Genitourinary: No CVA tenderness; Extremities: Pulses normal, No tenderness, No edema, No calf edema or asymmetry.; Neuro: AA&Ox3, Major CN grossly intact.  Speech clear. No gross focal motor or sensory deficits in extremities.; Skin: Color normal, Warm, Dry.   ED Course  Procedures     EKG Interpretation None      MDM  MDM Reviewed: previous chart, nursing note and vitals Reviewed previous: labs Interpretation:  labs and CT scan   Results for orders placed or performed during the hospital encounter of 01/15/15  CBC with Differential  Result Value Ref Range   WBC 5.4 4.0 - 10.5 K/uL   RBC 4.68 4.22 - 5.81 MIL/uL   Hemoglobin 14.5 13.0 - 17.0 g/dL   HCT 41.4 39.0 - 52.0 %   MCV 88.5 78.0 -  100.0 fL   MCH 31.0 26.0 - 34.0 pg   MCHC 35.0 30.0 - 36.0 g/dL   RDW 13.5 11.5 - 15.5 %   Platelets 151 150 - 400 K/uL   Neutrophils Relative % 49 43 - 77 %   Neutro Abs 2.6 1.7 - 7.7 K/uL   Lymphocytes Relative 39 12 - 46 %   Lymphs Abs 2.1 0.7 - 4.0 K/uL   Monocytes Relative 10 3 - 12 %   Monocytes Absolute 0.5 0.1 - 1.0 K/uL   Eosinophils Relative 2 0 - 5 %   Eosinophils Absolute 0.1 0.0 - 0.7 K/uL   Basophils Relative 0 0 - 1 %   Basophils Absolute 0.0 0.0 - 0.1 K/uL  Comprehensive metabolic panel  Result Value Ref Range   Sodium 139 135 - 145 mmol/L   Potassium 3.7 3.5 - 5.1 mmol/L   Chloride 109 101 - 111 mmol/L   CO2 27 22 - 32 mmol/L   Glucose, Bld 94 70 - 99 mg/dL   BUN 12 6 - 20 mg/dL   Creatinine, Ser 1.01 0.61 - 1.24 mg/dL   Calcium 8.9 8.9 - 10.3 mg/dL   Total Protein 7.3 6.5 - 8.1 g/dL   Albumin 4.1 3.5 - 5.0 g/dL   AST 22 15 - 41 U/L   ALT 34 17 - 63 U/L   Alkaline Phosphatase 80 38 - 126 U/L   Total Bilirubin 0.5 0.3 - 1.2 mg/dL   GFR calc non Af Amer >60 >60 mL/min   GFR calc Af Amer >60 >60 mL/min   Anion gap 3 (L) 5 - 15  Lipase, blood  Result Value Ref Range   Lipase 19 (L) 22 - 51 U/L  Urinalysis with microscopic  Result Value Ref Range   Color, Urine YELLOW YELLOW   APPearance CLEAR CLEAR   Specific Gravity, Urine 1.014 1.005 - 1.030   pH 6.0 5.0 - 8.0   Glucose, UA NEGATIVE NEGATIVE mg/dL   Hgb urine dipstick NEGATIVE NEGATIVE   Bilirubin Urine NEGATIVE NEGATIVE   Ketones, ur NEGATIVE NEGATIVE mg/dL   Protein, ur NEGATIVE NEGATIVE mg/dL   Urobilinogen, UA 0.2 0.0 - 1.0 mg/dL   Nitrite NEGATIVE NEGATIVE   Leukocytes, UA NEGATIVE NEGATIVE   WBC, UA 0-2 <3 WBC/hpf   RBC / HPF 0-2 <3 RBC/hpf   Bacteria, UA RARE RARE   Squamous Epithelial / LPF RARE RARE   Urine-Other MUCOUS PRESENT    Ct Abdomen Pelvis W Contrast 01/15/2015   CLINICAL DATA:  Abdominal pain and nausea  EXAM: CT ABDOMEN AND PELVIS WITH CONTRAST  TECHNIQUE: Multidetector CT  imaging of the abdomen and pelvis was performed using the standard protocol following bolus administration of intravenous contrast.  CONTRAST:  167mL OMNIPAQUE IOHEXOL 300 MG/ML SOLN, 65mL OMNIPAQUE IOHEXOL 300 MG/ML SOLN  COMPARISON:  None.  FINDINGS: BODY WALL: No contributory findings.  LOWER CHEST: No contributory findings.  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 There are no morphologic changes  of cirrhosis.  Biliary: No evidence of biliary obstruction or stone.  Pancreas: Unremarkable.  Spleen: Unremarkable.  Adrenals: Unremarkable.  Kidneys and ureters: No hydronephrosis or stone.  Bladder: Unremarkable.  Reproductive: No pathologic findings.  Bowel: 12 mm long high-density structure in the wall and pericolonic fat of the sigmoid colon on image 62. Density is closest to bone, and this may reflect a ingested animal bone. Despite the wall penetration or perforation, there is no surrounding inflammatory change to suggest acuity. Normal appendix. No bowel obstruction.  Retroperitoneum: No mass or adenopathy.  Peritoneum: No ascites or pneumoperitoneum.  Vascular: Age advanced diffuse arterial calcification  OSSEOUS: No acute abnormalities.  IMPRESSION: 12 mm long foreign body (possibly ingested bone) in the sigmoid colonic wall and pericolonic fat. Given absence of inflammatory change, this may be chronic.  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 Recommend further evaluation with liver protocol MRI.   Electronically Signed   By: Monte Fantasia M.D.   On: 01/15/2015 09:46    1010:  Pt's VS remain stable. Labs and UA reassuring. CT scan with possible perforation to sigmoid colonic wall by FB. T/C to General Surgery PA Creig Hines, case discussed, including:  HPI, pertinent PM/SHx, VS/PE, dx testing, ED  course and treatment:  Agreeable to come to ED for evaluation.   1230:  General Surgery has evaluated pt while in the ED: states no acute surgical emergency at this time and pt can be discharged. Pt has tol PO well without N/V. No stooling while in the ED. Pt states he wants to go home now. Dx and testing d/w pt.  Questions answered.  Verb understanding, agreeable to d/c home with outpt f/u.   Francine Graven, DO 01/17/15 1302

## 2015-01-15 NOTE — ED Notes (Signed)
Pt states abdominal pain since yesterday.  Nausea with no vomiting.  Denies change in urination or bowels.  No emesis.  No fever.

## 2015-12-17 ENCOUNTER — Encounter (HOSPITAL_COMMUNITY): Payer: Self-pay | Admitting: *Deleted

## 2015-12-17 ENCOUNTER — Emergency Department (HOSPITAL_COMMUNITY)
Admission: EM | Admit: 2015-12-17 | Discharge: 2015-12-18 | Disposition: A | Discharge status: Home / Self Care | Payer: BLUE CROSS/BLUE SHIELD | Attending: Emergency Medicine | Admitting: Emergency Medicine

## 2015-12-17 DIAGNOSIS — H9209 Otalgia, unspecified ear: Secondary | ICD-10-CM | POA: Insufficient documentation

## 2015-12-17 DIAGNOSIS — R51 Headache: Secondary | ICD-10-CM | POA: Insufficient documentation

## 2015-12-17 DIAGNOSIS — H5712 Ocular pain, left eye: Secondary | ICD-10-CM | POA: Insufficient documentation

## 2015-12-17 DIAGNOSIS — R110 Nausea: Secondary | ICD-10-CM | POA: Insufficient documentation

## 2015-12-17 DIAGNOSIS — R519 Headache, unspecified: Secondary | ICD-10-CM

## 2015-12-17 DIAGNOSIS — F17210 Nicotine dependence, cigarettes, uncomplicated: Secondary | ICD-10-CM | POA: Insufficient documentation

## 2015-12-17 NOTE — ED Notes (Signed)
Pt symptoms discussed with MD knott. No further orders received, advised not to enter stroke orders due to patient history of symptom and lack of neuro deficits.

## 2015-12-17 NOTE — ED Provider Notes (Signed)
CSN: [id number]     Arrival date & time 12/17/15  2101 History  By signing my name below, I, Evelene Croon, attest that this documentation has been prepared under the direction and in the presence of Orpah Greek, MD . Electronically Signed: Evelene Croon, Scribe. 12/18/2015. 12:39 AM.    Chief Complaint  Patient presents with  . Headache   The history is provided by the patient. No language interpreter was used.     HPI Comments:  Barry [name] is a 50 y.o. male who presents to the Emergency Department complaining of HA with constant left sided pain which began today . He reports h/o same but has never been evaluated by a neurologist.  He reports associated left eye pain and nausea. He denies fever, neck pain, numbness/weakness in his extremities, photophobia and vomiting. No alleviating factors noted.   Past Medical History  Diagnosis Date  . Headache   . Left shoulder pain    Past Surgical History  Procedure Laterality Date  . Rotator cuff repair Left    History reviewed. No pertinent family history. Social History  Substance Use Topics  . Smoking status: Current Every Day Smoker    Types: Cigars  . Smokeless tobacco: None  . Alcohol Use: Yes     Comment: 1 beer every day     Review of Systems  Constitutional: Negative for fever.  HENT: Positive for ear pain.   Eyes: Negative for photophobia.  Gastrointestinal: Positive for nausea. Negative for vomiting.  Musculoskeletal: Negative for neck pain.  Neurological: Positive for headaches. Negative for weakness and numbness.  All other systems reviewed and are negative.  Allergies  Shellfish allergy  Home Medications   Prior to Admission medications   Medication Sig Start Date End Date Taking? Authorizing Provider  acetaminophen (TYLENOL) 325 MG tablet Take 650 mg by mouth every 6 (six) hours as needed for headache.   Yes Historical Provider, MD  ondansetron (ZOFRAN) 4 MG tablet Take 1 tablet (4 mg total) by  mouth every 8 (eight) hours as needed for nausea or vomiting. Patient not taking: Reported on 12/18/2015 01/15/15   Francine Graven, DO  promethazine (PHENERGAN) 25 MG tablet Take 1 tablet (25 mg total) by mouth every 6 (six) hours as needed (headache). 12/18/15   Orpah Greek, MD   BP 135/90 mmHg  Pulse 68  Temp(Src) 98 F (36.7 C) (Oral)  Resp 20  Ht 5\' 8"  (1.727 m)  Wt 162 lb 2 oz (73.539 kg)  BMI 24.66 kg/m2  SpO2 99% Physical Exam  Constitutional: He is oriented to person, place, and time. He appears well-developed and well-nourished. No distress.  HENT:  Head: Normocephalic and atraumatic.  Right Ear: Hearing normal.  Left Ear: Hearing normal.  Nose: Nose normal.  Mouth/Throat: Oropharynx is clear and moist and mucous membranes are normal.  Eyes: Conjunctivae and EOM are normal. Pupils are equal, round, and reactive to light.  Neck: Normal range of motion. Neck supple.  Cardiovascular: Regular rhythm, S1 normal and S2 normal.  Exam reveals no gallop and no friction rub.   No murmur heard. Pulmonary/Chest: Effort normal and breath sounds normal. No respiratory distress. He exhibits no tenderness.  Abdominal: Soft. Normal appearance and bowel sounds are normal. There is no hepatosplenomegaly. There is no tenderness. There is no rebound, no guarding, no tenderness at McBurney's point and negative Murphy's sign. No hernia.  Musculoskeletal: Normal range of motion.  Neurological: He is alert and oriented to person, place,  and time. He has normal strength. No cranial nerve deficit or sensory deficit. Coordination normal. GCS eye subscore is 4. GCS verbal subscore is 5. GCS motor subscore is 6.  Skin: Skin is warm, dry and intact. No rash noted. No cyanosis.  Psychiatric: He has a normal mood and affect. His speech is normal and behavior is normal. Thought content normal.  Nursing note and vitals reviewed.   ED Course  Procedures  DIAGNOSTIC STUDIES:  Oxygen Saturation is  97% on RA, normal by my interpretation.    COORDINATION OF CARE:  12:28 AM Discussed treatment plan with pt at bedside and pt agreed to plan.  Labs Review Labs Reviewed - No data to display  Imaging Review No results found. I have personally reviewed and evaluated these images and lab results as part of my medical decision-making.   EKG Interpretation None      MDM   Final diagnoses:  Headache, unspecified headache type    Resents to the ER for evaluation of headache. Patient has had similar headaches in the past. Patient has been seen in the ER and had neuro imaging performed for similar headaches, no acute abnormality was noted. Patient has never followed up or had a formal diagnosis, but it sounds that he has recurrent migraine. Patient has had resolution of his headache with migraine cocktail. This is reassuring. He did not have any neurologic deficit. I did not feel he required any further workup today, but will refer to neurology.  I personally performed the services described in this documentation, which was scribed in my presence. The recorded information has been reviewed and is accurate.    Orpah Greek, MD 12/18/15 [phone]

## 2015-12-17 NOTE — ED Notes (Signed)
Pt in c/o headache that started about an hour ago, pain worse in left side of his head and pain to his left eye, eye is blurry intermittently, seen recently for same and states they have been unable to find reasons for the headaches. Symptoms have been off and on since last year. Symptoms are always consistent when they present with left headache and left eye pain. No neuro deficits noted.

## 2015-12-18 MED ORDER — KETOROLAC TROMETHAMINE 60 MG/2ML IM SOLN
60.0000 mg | Freq: Once | INTRAMUSCULAR | Status: AC
  Administered 2015-12-18: 60 mg via INTRAMUSCULAR
  Filled 2015-12-18: qty 2

## 2015-12-18 MED ORDER — PROMETHAZINE HCL 25 MG PO TABS: 25.0000 mg | ORAL_TABLET | Freq: Four times a day (QID) | ORAL | Status: DC | PRN

## 2015-12-18 MED ORDER — PROCHLORPERAZINE EDISYLATE 5 MG/ML IJ SOLN
10.0000 mg | Freq: Once | INTRAMUSCULAR | Status: AC
  Administered 2015-12-18: 10 mg via INTRAMUSCULAR
  Filled 2015-12-18: qty 2

## 2015-12-18 MED ORDER — DIPHENHYDRAMINE HCL 50 MG/ML IJ SOLN
25.0000 mg | Freq: Once | INTRAMUSCULAR | Status: AC
  Administered 2015-12-18: 25 mg via INTRAMUSCULAR
  Filled 2015-12-18: qty 1

## 2015-12-18 MED ORDER — PREDNISONE 20 MG PO TABS
60.0000 mg | ORAL_TABLET | Freq: Once | ORAL | Status: AC
  Administered 2015-12-18: 60 mg via ORAL
  Filled 2015-12-18: qty 3

## 2015-12-18 NOTE — ED Notes (Signed)
Pt stable, ambulatory, states understanding of discharge instructions 

## 2015-12-18 NOTE — Discharge Instructions (Signed)

## 2016-12-01 ENCOUNTER — Encounter (HOSPITAL_COMMUNITY): Payer: Self-pay

## 2016-12-01 ENCOUNTER — Emergency Department (HOSPITAL_COMMUNITY)
Admission: EM | Admit: 2016-12-01 | Discharge: 2016-12-01 | Disposition: A | Discharge status: Home / Self Care | Payer: BLUE CROSS/BLUE SHIELD | Attending: Emergency Medicine | Admitting: Emergency Medicine

## 2016-12-01 DIAGNOSIS — F17290 Nicotine dependence, other tobacco product, uncomplicated: Secondary | ICD-10-CM | POA: Diagnosis not present

## 2016-12-01 DIAGNOSIS — G44029 Chronic cluster headache, not intractable: Secondary | ICD-10-CM

## 2016-12-01 DIAGNOSIS — R51 Headache: Secondary | ICD-10-CM | POA: Diagnosis present

## 2016-12-01 DIAGNOSIS — I10 Essential (primary) hypertension: Secondary | ICD-10-CM | POA: Diagnosis not present

## 2016-12-01 MED ORDER — SUMATRIPTAN SUCCINATE 6 MG/0.5ML ~~LOC~~ SOLN: 6.0000 mg | Freq: Once | SUBCUTANEOUS | Status: DC

## 2016-12-01 MED ORDER — PROCHLORPERAZINE MALEATE 5 MG PO TABS
10.0000 mg | ORAL_TABLET | Freq: Once | ORAL | Status: AC
  Administered 2016-12-01: 10 mg via ORAL
  Filled 2016-12-01: qty 2

## 2016-12-01 NOTE — ED Triage Notes (Signed)
Patient complains of intermittent left sided headache for 1 year. Photophobia and blurred vision with same. States he has been evaluated for same and no cause noted, alert and oriented, NAD. HR 120's on arrival

## 2016-12-01 NOTE — ED Provider Notes (Signed)
[facility] DEPT Provider Note   CSN: [id number] Arrival date & time: 12/01/16  0806     History   Chief Complaint Chief Complaint  Patient presents with  . Headache    HPI Barry [name]. is a 51 y.o. male.  HPI   51 year old man complaining of left-sided headache. He states he has been getting a headache on the left side daily to every other day for approximately year and half. He has been seen and evaluated here for this with head CT in the past. He states he takes over-the-counter medication but is not sure what it is as it states pain reliever on bottle. He has some associated nausea but has not vomited. He denies any lateralized weakness or vision changes besides the blurring and occurs with ongoing monitoring. He realizes that he has had high blood pressure the past but has not sought any outpatient follow-up. He denies any head injury, his drug use, excessive alcohol use, eye injury, or history of cancer.  Past Medical History:  Diagnosis Date  . Headache   . Left shoulder pain     Patient Active Problem List   Diagnosis Date Noted  . WEIGHT LOSS 10/25/2010    Past Surgical History:  Procedure Laterality Date  . ROTATOR CUFF REPAIR Left        Home Medications    Prior to Admission medications   Medication Sig Start Date End Date Taking? Authorizing Provider  acetaminophen (TYLENOL) 325 MG tablet Take 650 mg by mouth every 6 (six) hours as needed for headache.    Historical Provider, MD  ondansetron (ZOFRAN) 4 MG tablet Take 1 tablet (4 mg total) by mouth every 8 (eight) hours as needed for nausea or vomiting. Patient not taking: Reported on 12/18/2015 01/15/15   Francine Graven, DO  promethazine (PHENERGAN) 25 MG tablet Take 1 tablet (25 mg total) by mouth every 6 (six) hours as needed (headache). 12/18/15   Orpah Greek, MD    Family History No family history on file.  Social History Social History  Substance Use Topics  . Smoking  status: Current Every Day Smoker    Types: Cigars  . Smokeless tobacco: Never Used  . Alcohol use Yes     Comment: 1 beer every day      Allergies   Shellfish allergy   Review of Systems Review of Systems  All other systems reviewed and are negative.    Physical Exam Updated Vital Signs BP (!) 142/91   Pulse (!) 110   Temp 98.2 F (36.8 C) (Oral)   Resp 19   Ht 5' 8.5" (1.74 m)   Wt 75.3 kg   SpO2 97%   BMI 24.87 kg/m   Physical Exam  Constitutional: He is oriented to person, place, and time. He appears well-developed and well-nourished.  HENT:  Head: Normocephalic and atraumatic.  Right Ear: Tympanic membrane and external ear normal.  Left Ear: Tympanic membrane and external ear normal.  Nose: Nose normal. Right sinus exhibits no maxillary sinus tenderness and no frontal sinus tenderness. Left sinus exhibits no maxillary sinus tenderness and no frontal sinus tenderness.  Eyes: Conjunctivae and EOM are normal. Pupils are equal, round, and reactive to light. Right eye exhibits no nystagmus. Left eye exhibits no nystagmus.  Neck: Normal range of motion. Neck supple.  Cardiovascular: Normal rate, regular rhythm, normal heart sounds and intact distal pulses.   Pulmonary/Chest: Effort normal and breath sounds normal. No respiratory distress. He exhibits no  tenderness.  Abdominal: Soft. Bowel sounds are normal. He exhibits no distension and no mass. There is no tenderness.  Musculoskeletal: Normal range of motion. He exhibits no edema or tenderness.  Neurological: He is alert and oriented to person, place, and time. He has normal strength. No sensory deficit. He displays a negative Romberg sign. GCS eye subscore is 4. GCS verbal subscore is 5. GCS motor subscore is 6. He displays no Babinski's sign on the right side. He displays no Babinski's sign on the left side.  Reflex Scores:      Brachioradialis reflexes are 1+ on the right side and 1+ on the left side.      Patellar  reflexes are 2+ on the right side and 2+ on the left side. Patient with normal gait without ataxia, shuffling, spasm, or antalgia. Speech is normal without dysarthria, dysphasia, or aphasia. Muscle strength is 5/5 in bilateral shoulders, elbow flexor and extensors, wrist flexor and extensors, and intrinsic hand muscles. 5/5 bilateral lower extremity hip flexors, extensors, knee flexors and extensors, and ankle dorsi and plantar flexors.    Skin: Skin is warm and dry. No rash noted.  Psychiatric: He has a normal mood and affect. His behavior is normal. Judgment and thought content normal.  Nursing note and vitals reviewed.    ED Treatments / Results  Labs (all labs ordered are listed, but only abnormal results are displayed) Labs Reviewed - No data to display  EKG  EKG Interpretation None       Radiology No results found.  Procedures Procedures (including critical care time)  Medications Ordered in ED Medications  prochlorperazine (COMPAZINE) tablet 10 mg (10 mg Oral Given 12/01/16 0903)     Initial Impression / Assessment and Plan / ED Course  I have reviewed the triage vital signs and the nursing notes.  Pertinent labs & imaging results that were available during my care of the patient were reviewed by me and considered in my medical decision making (see chart for details).   patient with good pain control with oxygen and received by mouth Compazine. Blood pressure has decreased to 142/91 from 1 7499 heart rate has decreased from 120-110. Plan referral to neurology. Discussed need for him to obtain primary care and follow-up on blood pressure. He voices understanding of plan and need for follow-up as well as return precautions  Final Clinical Impressions(s) / ED Diagnoses   Final diagnoses:  Chronic cluster headache, not intractable  Hypertension, unspecified type    New Prescriptions New Prescriptions   No medications on file     Pattricia Boss, MD 12/01/16  [phone]

## 2017-02-10 ENCOUNTER — Emergency Department
Admission: EM | Admit: 2017-02-10 | Discharge: 2017-02-10 | Disposition: A | Discharge status: Home / Self Care | Payer: BLUE CROSS/BLUE SHIELD | Attending: Emergency Medicine | Admitting: Emergency Medicine

## 2017-02-10 DIAGNOSIS — R2231 Localized swelling, mass and lump, right upper limb: Secondary | ICD-10-CM | POA: Diagnosis not present

## 2017-02-10 DIAGNOSIS — F17290 Nicotine dependence, other tobacco product, uncomplicated: Secondary | ICD-10-CM | POA: Insufficient documentation

## 2017-02-10 DIAGNOSIS — W57XXXA Bitten or stung by nonvenomous insect and other nonvenomous arthropods, initial encounter: Secondary | ICD-10-CM | POA: Diagnosis not present

## 2017-02-10 MED ORDER — HYDROCORTISONE 1 % EX LOTN: 1.0000 "application " | TOPICAL_LOTION | Freq: Two times a day (BID) | CUTANEOUS | 0 refills | Status: DC

## 2017-02-10 NOTE — ED Notes (Addendum)
See triage note..the patient c/o spider bite to R hand. Slight swelling noted. Pt denies tenderness.  Pt sts spider small and black.  Resp even and unlabored. Ambulatory w/o issue. NAD

## 2017-02-10 NOTE — ED Provider Notes (Signed)
[facility] Emergency Department Provider Note  ____________________________________________  Time seen: Approximately 9:41 PM  I have reviewed the triage vital signs and the nursing notes.   HISTORY  Chief Complaint Insect Bite    HPI [name]. is a 51 y.o. male presenting to the emergency department with a suspected spider bite of the skin overlying the right hand. Patient states that he saw an insect with "many legs" that bit him. Patient states that he felt some mild stinging at the bite site. He denies muscle spasms. He feels no sense of anxiety. He denies associated chest pain, chest tightness, nausea, vomiting or abdominal pain. Patient presents the emergency department for reassurance. No alleviating measures have been undertaken.   Past Medical History:  Diagnosis Date  . Headache   . Left shoulder pain     Patient Active Problem List   Diagnosis Date Noted  . WEIGHT LOSS 10/25/2010    Past Surgical History:  Procedure Laterality Date  . ROTATOR CUFF REPAIR Left     Prior to Admission medications   Medication Sig Start Date End Date Taking? Authorizing Provider  acetaminophen (TYLENOL) 325 MG tablet Take 650 mg by mouth every 6 (six) hours as needed for headache.    [provider]  hydrocortisone 1 % lotion Apply 1 application topically 2 (two) times daily. 02/10/17   Lannie Fields, PA-C  ondansetron (ZOFRAN) 4 MG tablet Take 1 tablet (4 mg total) by mouth every 8 (eight) hours as needed for nausea or vomiting. Patient not taking: Reported on 12/18/2015 01/15/15   Francine Graven, DO  promethazine (PHENERGAN) 25 MG tablet Take 1 tablet (25 mg total) by mouth every 6 (six) hours as needed (headache). 12/18/15   Orpah Greek, MD    Allergies Shellfish allergy  No family history on file.  Social History Social History  Substance Use Topics  . Smoking status: Current Every Day Smoker    Types: Cigars  .  Smokeless tobacco: Never Used  . Alcohol use Yes     Comment: 1 beer every day      Review of Systems  Constitutional: No fever/chills Eyes: No visual changes. No discharge ENT: No upper respiratory complaints. Cardiovascular: no chest pain. Respiratory: no cough. No SOB. Gastrointestinal: No abdominal pain.  No nausea, no vomiting.  No diarrhea.  No constipation. Musculoskeletal: Negative for musculoskeletal pain. Skin: Patient has suspected insect bite Neurological: Negative for headaches, focal weakness or numbness.   ____________________________________________   PHYSICAL EXAM:  VITAL SIGNS: ED Triage Vitals  Enc Vitals Group     BP 02/10/17 2026 140/84     Pulse Rate 02/10/17 2026 88     Resp 02/10/17 2026 20     Temp 02/10/17 2026 98.2 F (36.8 C)     Temp Source 02/10/17 2026 Oral     SpO2 02/10/17 2026 100 %     Weight 02/10/17 2026 166 lb (75.3 kg)     Height 02/10/17 2026 5\' 8"  (1.727 m)     Head Circumference --      Peak Flow --      Pain Score 02/10/17 2050 3     Pain Loc --      Pain Edu? --      Excl. in [place]? --      Constitutional: Alert and oriented. Well appearing and in no acute distress. Eyes: Conjunctivae are normal. PERRL. EOMI. Head: Atraumatic. ENT:      Mouth/Throat: Mucous  membranes are moist. Uvula is midline. Airway is patent Neck: Full range of motion Cardiovascular: Normal rate, regular rhythm. Normal S1 and S2.  Good peripheral circulation. Respiratory: Normal respiratory effort without tachypnea or retractions. Lungs CTAB. Good air entry to the bases with no decreased or absent breath sounds. Gastrointestinal: Bowel sounds 4 quadrants. Soft and nontender to palpation. No guarding or rigidity. No palpable masses. No distention. No CVA tenderness. Musculoskeletal: Full range of motion to all extremities. No gross deformities appreciated. Palpable radial and ulnar pulses bilaterally and symmetrically. Neurologic:  Normal speech and  language. No gross focal neurologic deficits are appreciated.  Skin: No edema. No erythema. Bite marks cannot be visualized. Psychiatric: Mood and affect are normal. Speech and behavior are normal. Patient exhibits appropriate insight and judgement.   ____________________________________________   LABS (all labs ordered are listed, but only abnormal results are displayed)  Labs Reviewed - No data to display ____________________________________________  EKG   ____________________________________________  RADIOLOGY   No results found.  ____________________________________________    PROCEDURES  Procedure(s) performed:    Procedures    Medications - No data to display   ____________________________________________   INITIAL IMPRESSION / ASSESSMENT AND PLAN / ED COURSE  Pertinent labs & imaging results that were available during my care of the patient were reviewed by me and considered in my medical decision making (see chart for details).  Review of the [place] CSRS was performed in accordance of the [place] prior to dispensing any controlled drugs.     Assessment and plan: Bug bite: Patient presents to the emergency department with suspected spider bite. Patient denies muscle spasms, nausea, vomiting, abdominal pain or anxiety. Physical exam was reassuring. Patient was discharged with hydrocortisone cream. Vital signs are reassuring at discharge. All patient questions were answered.     ____________________________________________  FINAL CLINICAL IMPRESSION(S) / ED DIAGNOSES  Final diagnoses:  Insect bite, initial encounter      NEW MEDICATIONS STARTED DURING THIS VISIT:  New Prescriptions   HYDROCORTISONE 1 % LOTION    Apply 1 application topically 2 (two) times daily.        This chart was dictated using voice recognition software/Dragon. Despite best efforts to proofread, errors can occur which can change the meaning. Any change was purely  unintentional.    Lannie Fields, PA-C 02/10/17 2148    Orbie Pyo, MD 02/10/17 2329

## 2017-02-10 NOTE — ED Notes (Signed)

## 2017-02-10 NOTE — ED Triage Notes (Signed)
Pt states he was bitten by small black spider to right hand, small area of swelling noted. Pt states "I feel like pins are sticking me". States happened prior to coming in.

## 2017-08-11 ENCOUNTER — Emergency Department
Admission: EM | Admit: 2017-08-11 | Discharge: 2017-08-11 | Disposition: A | Discharge status: Home / Self Care | Payer: BLUE CROSS/BLUE SHIELD | Attending: Emergency Medicine | Admitting: Emergency Medicine

## 2017-08-11 ENCOUNTER — Encounter: Payer: Self-pay | Admitting: Emergency Medicine

## 2017-08-11 DIAGNOSIS — L0231 Cutaneous abscess of buttock: Secondary | ICD-10-CM | POA: Diagnosis not present

## 2017-08-11 DIAGNOSIS — F17290 Nicotine dependence, other tobacco product, uncomplicated: Secondary | ICD-10-CM | POA: Insufficient documentation

## 2017-08-11 DIAGNOSIS — R222 Localized swelling, mass and lump, trunk: Secondary | ICD-10-CM | POA: Diagnosis present

## 2017-08-11 MED ORDER — OXYCODONE-ACETAMINOPHEN 5-325 MG PO TABS: 1.0000 | ORAL_TABLET | Freq: Four times a day (QID) | ORAL | 0 refills | Status: DC | PRN

## 2017-08-11 MED ORDER — OXYCODONE-ACETAMINOPHEN 5-325 MG PO TABS: 1.0000 | ORAL_TABLET | ORAL | 0 refills | Status: DC | PRN

## 2017-08-11 MED ORDER — LIDOCAINE HCL (PF) 1 % IJ SOLN
5.0000 mL | Freq: Once | INTRAMUSCULAR | Status: AC
  Administered 2017-08-11: 5 mL
  Filled 2017-08-11: qty 5

## 2017-08-11 MED ORDER — OXYCODONE-ACETAMINOPHEN 5-325 MG PO TABS
1.0000 | ORAL_TABLET | Freq: Once | ORAL | Status: AC
  Administered 2017-08-11: 1 via ORAL
  Filled 2017-08-11: qty 1

## 2017-08-11 MED ORDER — SULFAMETHOXAZOLE-TRIMETHOPRIM 800-160 MG PO TABS: 1.0000 | ORAL_TABLET | Freq: Two times a day (BID) | ORAL | 0 refills | Status: DC

## 2017-08-11 NOTE — ED Provider Notes (Signed)
[facility] Emergency Department Provider Note  ____________________________________________   First MD Initiated Contact with Patient 08/11/17 [phone]     (approximate)  I have reviewed the triage vital signs and the nursing notes.   HISTORY  Chief Complaint Abscess   HPI [name]. is a 51 y.o. male is here with complaint of painful area on his right buttocks. Patient states he has never had an abscess before. 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 He is unaware of any fever or chills. He has been using warm compresses and actually sat in a tub of warm water without any relief. He denies any previous abscesses.currently rates his pain as 10 over 10.   Past Medical History:  Diagnosis Date  . Headache   . Left shoulder pain     Patient Active Problem List   Diagnosis Date Noted  . WEIGHT LOSS 10/25/2010    Past Surgical History:  Procedure Laterality Date  . ROTATOR CUFF REPAIR Left     Prior to Admission medications   Medication Sig Start Date End Date Taking? Authorizing Provider  oxyCODONE-acetaminophen (PERCOCET) 5-325 MG tablet Take 1 tablet by mouth every 6 (six) hours as needed for severe pain. 08/11/17   Johnn Hai, PA-C  sulfamethoxazole-trimethoprim (BACTRIM DS,SEPTRA DS) 800-160 MG tablet Take 1 tablet by mouth 2 (two) times daily. 08/11/17   Johnn Hai, PA-C    Allergies Shellfish allergy  No family history on file.  Social History Social History   Tobacco Use  . Smoking status: Current Every Day Smoker    Types: Cigars  . Smokeless tobacco: Never Used  Substance Use Topics  . Alcohol use: Yes    Comment: 1 beer every day   . Drug use: No    Review of Systems Constitutional: No fever/chills Cardiovascular: Denies chest pain. Respiratory: Denies shortness of breath. Gastrointestinal: No abdominal pain.  Skin: positive for abscess. Neurological: Negative for  headaches ____________________________________________   PHYSICAL EXAM:  VITAL SIGNS: ED Triage Vitals [08/11/17 0713]  Enc Vitals Group     BP (!) 142/84     Pulse Rate 87     Resp 16     Temp 98.3 F (36.8 C)     Temp Source Oral     SpO2 100 %     Weight 186 lb (84.4 kg)     Height 5\' 8"  (1.727 m)     Head Circumference      Peak Flow      Pain Score 10     Pain Loc      Pain Edu?      Excl. in [place]?    Constitutional: Alert and oriented. Well appearing and in no acute distress. Eyes: Conjunctivae are normal.  Head: Atraumatic. Neck: No stridor.   Cardiovascular: Normal rate, regular rhythm. Grossly normal heart sounds.  Good peripheral circulation. Respiratory: Normal respiratory effort.  No retractions. Lungs CTAB. Musculoskeletal: moves upper and lower extremities without any difficulty. Normal gait was noted. Neurologic:  Normal speech and language. No gross focal neurologic deficits are appreciated. No gait instability. Skin:  Skin is warm, dry.  Right medial buttocks there is an abscess with marked tenderness to light touch. No surrounding cellulitis and no drainage is noted.area has a fluctuant center. Psychiatric: Mood and affect are normal. Speech and behavior are normal.  ____________________________________________   LABS (all labs ordered are listed, but only abnormal results are  displayed)  Labs Reviewed - No data to display   PROCEDURES  Procedure(s) performed: INCISION AND DRAINAGE Performed by: Johnn Hai Consent: Verbal consent obtained. Risks and benefits: risks, benefits and alternatives were discussed Type: abscess  Body area: right buttocks  Anesthesia: local infiltration  Incision was made with a scalpel.  Local anesthetic: lidocaine 1% without epinephrine  Anesthetic total: 3.0 ml  Complexity: complex Blunt dissection to break up loculations  Drainage: purulent  Drainage amount: large  Packing material: 1/4 in iodoform  gauze  Patient tolerance: Patient tolerated the procedure well with no immediate complications.    Procedures  Critical Care performed: No  ____________________________________________   INITIAL IMPRESSION / ASSESSMENT AND PLAN / ED COURSE Patient was given Percocet prior to I&D. He tolerated procedure well. Patient was placed on Bactrim DS twice a day for 10 days and Percocet every 6 hours if needed for pain. He is aware that he needs to return here or to an urgent care to have packing removed in 2 days. He is also given a note to remain out of work. He is aware that he cannot take pain medication and drive or operate machinery. He was also given the name of the surgeon on call should he continue to have problems.  ____________________________________________   FINAL CLINICAL IMPRESSION(S) / ED DIAGNOSES  Final diagnoses:  Abscess of buttock, right     ED Discharge Orders        Ordered    oxyCODONE-acetaminophen (PERCOCET) 5-325 MG tablet  Every 4 hours PRN,   Status:  Discontinued     08/11/17 0838    sulfamethoxazole-trimethoprim (BACTRIM DS,SEPTRA DS) 800-160 MG tablet  2 times daily,   Status:  Discontinued     08/11/17 0838    oxyCODONE-acetaminophen (PERCOCET) 5-325 MG tablet  Every 6 hours PRN     08/11/17 0840    sulfamethoxazole-trimethoprim (BACTRIM DS,SEPTRA DS) 800-160 MG tablet  2 times daily     08/11/17 0840       Note:  This document was prepared using Dragon voice recognition software and may include unintentional dictation errors.    Johnn Hai, PA-C 08/11/17 1257    Lisa Roca, MD 08/11/17 1330

## 2017-08-11 NOTE — ED Triage Notes (Signed)
Patient presents to the ED with raised painful area to right buttock.  Patient states he is unable to sit straight due to pain.  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

## 2017-08-11 NOTE — ED Notes (Addendum)
See triage note.States he developed a "sore area " to buttocks on Friday  Area became worse yesterday  States he used warm compresses to area with min relief   Red tender area noted to left side of buttocks

## 2017-08-11 NOTE — Discharge Instructions (Signed)
Begin Taking antibiotics twice a day and Percocet 1 every 6 hours as needed for pain. Follow up with urgent care or return to the emergency Department in 2 days for packing removal. In the future you  may want to see a surgeon to have this completely removed. The surgeon on call's information is listed on your discharge papers. Do not drive or operate machinery while taking pain medication.

## 2017-08-19 ENCOUNTER — Telehealth: Payer: Self-pay | Admitting: Surgery

## 2017-08-19 NOTE — Telephone Encounter (Signed)
Patient was seen at [facility] ED on 08/11/17-had an I&D of abscess of right buttocks. Patient was to remove packing within 2 days.   I have called to follow up and to make an appointment for him to follow up with a surgeon-New patient.   No answer. I have left a message on voicemail.

## 2018-01-13 ENCOUNTER — Emergency Department: Payer: BLUE CROSS/BLUE SHIELD

## 2018-01-13 ENCOUNTER — Encounter: Payer: Self-pay | Admitting: Emergency Medicine

## 2018-01-13 ENCOUNTER — Emergency Department
Admission: EM | Admit: 2018-01-13 | Discharge: 2018-01-13 | Disposition: A | Discharge status: Home / Self Care | Payer: BLUE CROSS/BLUE SHIELD | Attending: Student in an Organized Health Care Education/Training Program | Admitting: Student in an Organized Health Care Education/Training Program

## 2018-01-13 DIAGNOSIS — Z79899 Other long term (current) drug therapy: Secondary | ICD-10-CM | POA: Insufficient documentation

## 2018-01-13 DIAGNOSIS — Y93H2 Activity, gardening and landscaping: Secondary | ICD-10-CM | POA: Diagnosis not present

## 2018-01-13 DIAGNOSIS — W19XXXA Unspecified fall, initial encounter: Secondary | ICD-10-CM | POA: Diagnosis not present

## 2018-01-13 DIAGNOSIS — Y92007 Garden or yard of unspecified non-institutional (private) residence as the place of occurrence of the external cause: Secondary | ICD-10-CM | POA: Insufficient documentation

## 2018-01-13 DIAGNOSIS — S39012A Strain of muscle, fascia and tendon of lower back, initial encounter: Secondary | ICD-10-CM | POA: Diagnosis not present

## 2018-01-13 DIAGNOSIS — F17210 Nicotine dependence, cigarettes, uncomplicated: Secondary | ICD-10-CM | POA: Diagnosis not present

## 2018-01-13 DIAGNOSIS — S3992XA Unspecified injury of lower back, initial encounter: Secondary | ICD-10-CM | POA: Diagnosis present

## 2018-01-13 DIAGNOSIS — Y999 Unspecified external cause status: Secondary | ICD-10-CM | POA: Diagnosis not present

## 2018-01-13 MED ORDER — KETOROLAC TROMETHAMINE 30 MG/ML IJ SOLN
30.0000 mg | Freq: Once | INTRAMUSCULAR | Status: AC
  Administered 2018-01-13: 30 mg via INTRAMUSCULAR

## 2018-01-13 MED ORDER — KETOROLAC TROMETHAMINE 30 MG/ML IJ SOLN
INTRAMUSCULAR | Status: AC
  Administered 2018-01-13: 30 mg
  Filled 2018-01-13: qty 1

## 2018-01-13 MED ORDER — MELOXICAM 15 MG PO TABS: 15.0000 mg | ORAL_TABLET | Freq: Every day | ORAL | 1 refills | Status: AC

## 2018-01-13 MED ORDER — KETOROLAC TROMETHAMINE 30 MG/ML IJ SOLN
30.0000 mg | Freq: Once | INTRAMUSCULAR | Status: DC
  Filled 2018-01-13: qty 1

## 2018-01-13 NOTE — ED Triage Notes (Signed)
Pt comes into the ED via POV c/o lower back pain after tripping over a stick in his yard.  Patient in NAD at this time and has good gait with ambulation.

## 2018-01-13 NOTE — ED Provider Notes (Signed)
[facility] Emergency Department Provider Note  ____________________________________________  Time seen: Approximately 5:32 PM  I have reviewed the triage vital signs and the nursing notes.   HISTORY  Chief Complaint Back Pain    HPI Griffen Frayne. is a 52 y.o. male presents to the emergency department with bilateral, sore, 8 out of 10 low back pain after patient reports that he tripped while working in his yard.  Patient denies radiculopathy and is able to ambulate through the emergency department without issue.  He denies weakness or changes in sensation of the lower extremities.  Patient denies a history of chronic back pain.  No alleviating measures of been attempted.  Past Medical History:  Diagnosis Date  . Headache   . Left shoulder pain     Patient Active Problem List   Diagnosis Date Noted  . WEIGHT LOSS 10/25/2010    Past Surgical History:  Procedure Laterality Date  . ROTATOR CUFF REPAIR Left     Prior to Admission medications   Medication Sig Start Date End Date Taking? Authorizing Provider  meloxicam (MOBIC) 15 MG tablet Take 1 tablet (15 mg total) by mouth daily for 7 days. 01/13/18 01/20/18  Lannie Fields, PA-C  oxyCODONE-acetaminophen (PERCOCET) 5-325 MG tablet Take 1 tablet by mouth every 6 (six) hours as needed for severe pain. 08/11/17   Johnn Hai, PA-C  sulfamethoxazole-trimethoprim (BACTRIM DS,SEPTRA DS) 800-160 MG tablet Take 1 tablet by mouth 2 (two) times daily. 08/11/17   Johnn Hai, PA-C    Allergies Shellfish allergy  No family history on file.  Social History Social History   Tobacco Use  . Smoking status: Current Every Day Smoker    Types: Cigars  . Smokeless tobacco: Never Used  Substance Use Topics  . Alcohol use: Yes    Comment: 1 beer every day   . Drug use: No     Review of Systems  Constitutional: No fever/chills Eyes: No visual changes. No discharge ENT: No upper respiratory  complaints. Cardiovascular: no chest pain. Respiratory: no cough. No SOB. Gastrointestinal: No abdominal pain.  No nausea, no vomiting.  No diarrhea.  No constipation. Musculoskeletal: Patient has low back pain.  Skin: Negative for rash, abrasions, lacerations, ecchymosis. Neurological: Negative for headaches, focal weakness or numbness.  ____________________________________________   PHYSICAL EXAM:  VITAL SIGNS: ED Triage Vitals  Enc Vitals Group     BP 01/13/18 1706 132/82     Pulse Rate 01/13/18 1706 86     Resp 01/13/18 1706 18     Temp 01/13/18 1706 98.6 F (37 C)     Temp Source 01/13/18 1706 Oral     SpO2 01/13/18 1706 99 %     Weight 01/13/18 1657 176 lb (79.8 kg)     Height 01/13/18 1657 5\' 8"  (1.727 m)     Head Circumference --      Peak Flow --      Pain Score 01/13/18 1657 8     Pain Loc --      Pain Edu? --      Excl. in [place]? --      Constitutional: Alert and oriented. Well appearing and in no acute distress. Eyes: Conjunctivae are normal. PERRL. EOMI. Head: Atraumatic. Cardiovascular: Normal rate, regular rhythm. Normal S1 and S2.  Good peripheral circulation. Respiratory: Normal respiratory effort without tachypnea or retractions. Lungs CTAB. Good air entry to the bases with no decreased or absent breath sounds. Gastrointestinal: Bowel sounds 4  quadrants. Soft and nontender to palpation. No guarding or rigidity. No palpable masses. No distention. No CVA tenderness. Musculoskeletal: Full range of motion to all extremities. No gross deformities appreciated.  Patient has paraspinal muscle tenderness along the lumbar spine. Neurologic:  Normal speech and language. No gross focal neurologic deficits are appreciated.  Skin:  Skin is warm, dry and intact. No rash noted.  ____________________________________________   LABS (all labs ordered are listed, but only abnormal results are displayed)  Labs Reviewed - No data to  display ____________________________________________  EKG   ____________________________________________  RADIOLOGY Unk Pinto, personally viewed and evaluated these images (plain radiographs) as part of my medical decision making, as well as reviewing the written report by the radiologist.    Dg Lumbar Spine 2-3 Views  Result Date: 01/13/2018 CLINICAL DATA:  Low back pain after tripping in yard today. EXAM: LUMBAR SPINE - 2-3 VIEW COMPARISON:  None. FINDINGS: Vertebral body alignment, heights and disc space heights are normal. There is minimal spondylosis of the lumbar spine. No evidence of compression fracture or spondylolisthesis. Calcified plaque over the abdominal aorta. IMPRESSION: No acute findings. Electronically Signed   By: Marin Olp M.D.   On: 01/13/2018 17:50    ____________________________________________    PROCEDURES  Procedure(s) performed:    Procedures    Medications  ketorolac (TORADOL) 30 MG/ML injection (30 mg  Given 01/13/18 1801)  ketorolac (TORADOL) 30 MG/ML injection 30 mg (30 mg Intramuscular Given 01/13/18 1809)     ____________________________________________   INITIAL IMPRESSION / ASSESSMENT AND PLAN / ED COURSE  Pertinent labs & imaging results that were available during my care of the patient were reviewed by me and considered in my medical decision making (see chart for details).  Review of the [place] CSRS was performed in accordance of the [place] prior to dispensing any controlled drugs.     Assessment and Plan: Low back pain Patient presents to the emergency department with low back pain after a fall that occurred in the yard.  Neurologic exam was reassuring.  Differential diagnosis originally included lumbar contusion, lumbar strain and fracture.  No acute fractures were identified on x-ray examination of the lumbar spine.  Patient was given an injection of Toradol in the emergency department.  He was discharged with meloxicam.  He  was advised to follow-up with primary care as needed.  All patient questions were answered.    ____________________________________________  FINAL CLINICAL IMPRESSION(S) / ED DIAGNOSES  Final diagnoses:  Strain of lumbar region, initial encounter      NEW MEDICATIONS STARTED DURING THIS VISIT:  ED Discharge Orders        Ordered    meloxicam (MOBIC) 15 MG tablet  Daily     01/13/18 1804          This chart was dictated using voice recognition software/Dragon. Despite best efforts to proofread, errors can occur which can change the meaning. Any change was purely unintentional.    Lannie Fields, PA-C 01/13/18 1817    Merlyn Lot, MD 01/13/18 Drema Halon

## 2018-11-17 ENCOUNTER — Encounter (HOSPITAL_COMMUNITY): Payer: Self-pay | Admitting: Emergency Medicine

## 2018-11-17 ENCOUNTER — Emergency Department (HOSPITAL_COMMUNITY)
Admission: EM | Admit: 2018-11-17 | Discharge: 2018-11-17 | Disposition: A | Discharge status: Home / Self Care | Payer: BLUE CROSS/BLUE SHIELD | Attending: Emergency Medicine | Admitting: Emergency Medicine

## 2018-11-17 DIAGNOSIS — F17210 Nicotine dependence, cigarettes, uncomplicated: Secondary | ICD-10-CM | POA: Insufficient documentation

## 2018-11-17 DIAGNOSIS — K0889 Other specified disorders of teeth and supporting structures: Secondary | ICD-10-CM | POA: Diagnosis not present

## 2018-11-17 DIAGNOSIS — Z79899 Other long term (current) drug therapy: Secondary | ICD-10-CM | POA: Diagnosis not present

## 2018-11-17 MED ORDER — IBUPROFEN 600 MG PO TABS: 600.0000 mg | ORAL_TABLET | Freq: Four times a day (QID) | ORAL | 0 refills | Status: DC | PRN

## 2018-11-17 MED ORDER — PENICILLIN V POTASSIUM 500 MG PO TABS: 500.0000 mg | ORAL_TABLET | Freq: Three times a day (TID) | ORAL | 0 refills | Status: DC

## 2018-11-17 NOTE — ED Provider Notes (Signed)
[name] Provider Note   CSN: [id number] Arrival date & time: 11/17/18  0631    History   Chief Complaint Chief Complaint  Patient presents with  . Dental Pain    HPI [name]. is a 53 y.o. male.     The history is provided by the patient. No language interpreter was used.  Dental Pain  Associated symptoms: no fever      53 year old male presenting for evaluation of dental pain.  Patient report for the past several days he has had progressive worsening pain to his left upper and lower teeth.  He described pain as a sharp sensation, waxing waning, worsening with chewing and with cold temperature.  Pain is moderate in severity and does radiates to the right side of his jaw.  He tries Tylenol ibuprofen at home with some relief.  He denies any associated fever, sore throat, neck pain, chest pain.  He denies any recent injury.  He does have a dentist but have not contacted his dentist yet.  Past Medical History:  Diagnosis Date  . Headache   . Left shoulder pain     Patient Active Problem List   Diagnosis Date Noted  . WEIGHT LOSS 10/25/2010    Past Surgical History:  Procedure Laterality Date  . ROTATOR CUFF REPAIR Left         Home Medications    Prior to Admission medications   Medication Sig Start Date End Date Taking? Authorizing Provider  oxyCODONE-acetaminophen (PERCOCET) 5-325 MG tablet Take 1 tablet by mouth every 6 (six) hours as needed for severe pain. 08/11/17   Johnn Hai, PA-C  sulfamethoxazole-trimethoprim (BACTRIM DS,SEPTRA DS) 800-160 MG tablet Take 1 tablet by mouth 2 (two) times daily. 08/11/17   Johnn Hai, PA-C    Family History No family history on file.  Social History Social History   Tobacco Use  . Smoking status: Current Every Day Smoker    Types: Cigars  . Smokeless tobacco: Never Used  Substance Use Topics  . Alcohol use: Yes    Comment: 1 beer every day   . Drug  use: No     Allergies   Shellfish allergy   Review of Systems Review of Systems  Constitutional: Negative for fever.  HENT: Positive for dental problem.      Physical Exam Updated Vital Signs BP (!) 160/102 (BP Location: Right Arm)   Pulse 96   Temp 98 F (36.7 C) (Oral)   Resp 18   SpO2 98%   Physical Exam Vitals signs and nursing note reviewed.  Constitutional:      General: He is not in acute distress.    Appearance: He is well-developed.  HENT:     Head: Atraumatic.     Comments: Mouth: Tenderness to tooth #14 with mild decay but no obvious dental abscess noted.  No trismus.  Throat examination unremarkable. Eyes:     Conjunctiva/sclera: Conjunctivae normal.  Neck:     Musculoskeletal: Neck supple.  Cardiovascular:     Rate and Rhythm: Normal rate and regular rhythm.  Pulmonary:     Effort: Pulmonary effort is normal.     Breath sounds: Normal breath sounds.  Skin:    Findings: No rash.  Neurological:     Mental Status: He is alert.      ED Treatments / Results  Labs (all labs ordered are listed, but only abnormal results are displayed) Labs Reviewed - No data  to display  EKG None  Radiology No results found.  Procedures Procedures (including critical care time)  Medications Ordered in ED Medications - No data to display   Initial Impression / Assessment and Plan / ED Course  I have reviewed the triage vital signs and the nursing notes.  Pertinent labs & imaging results that were available during my care of the patient were reviewed by me and considered in my medical decision making (see chart for details).        BP (!) 160/102 (BP Location: Right Arm)   Pulse 96   Temp 98 F (36.7 C) (Oral)   Resp 18   SpO2 98%    Final Clinical Impressions(s) / ED Diagnoses   Final diagnoses:  Pain, dental    ED Discharge Orders         Ordered    ibuprofen (ADVIL,MOTRIN) 600 MG tablet  Every 6 hours PRN     11/17/18 0717     penicillin v potassium (VEETID) 500 MG tablet  3 times daily     11/17/18 0717         7:16 AM Patient here with dental pain.  No obvious abscess appreciated.  Patient is well-appearing.  Blood pressure mildly elevated at 160/102 likely secondary to pain.  Will treat with anti-inflammatory medication antibiotic and recommend patient to follow-up with his dentist for further care.   Domenic Moras, PA-C 11/17/18 0720    Orpah Greek, MD 11/17/18 [phone]

## 2018-11-17 NOTE — ED Notes (Signed)
Pt discharged from ED; instructions provided and scripts given; Pt encouraged to return to ED if symptoms worsen and to f/u with PCP; Pt verbalized understanding of all instructions 

## 2018-11-17 NOTE — ED Triage Notes (Signed)
Patient reports pain and swelling to R upper teeth and L upper and lower. States that pain started yesterday morning. Hx of the same. Patient has a Pharmacist, community but has not contacted them.

## 2019-04-04 ENCOUNTER — Other Ambulatory Visit: Payer: Self-pay

## 2019-04-04 DIAGNOSIS — Z20822 Contact with and (suspected) exposure to covid-19: Secondary | ICD-10-CM

## 2019-04-05 LAB — NOVEL CORONAVIRUS, NAA: SARS-CoV-2, NAA: NOT DETECTED

## 2020-01-08 ENCOUNTER — Other Ambulatory Visit: Payer: Self-pay

## 2020-01-08 ENCOUNTER — Emergency Department: Payer: Self-pay

## 2020-01-08 ENCOUNTER — Encounter: Payer: Self-pay | Admitting: Emergency Medicine

## 2020-01-08 ENCOUNTER — Emergency Department
Admission: EM | Admit: 2020-01-08 | Discharge: 2020-01-08 | Disposition: A | Discharge status: Home / Self Care | Payer: Self-pay | Attending: Student | Admitting: Student

## 2020-01-08 DIAGNOSIS — Z79899 Other long term (current) drug therapy: Secondary | ICD-10-CM | POA: Insufficient documentation

## 2020-01-08 DIAGNOSIS — K921 Melena: Secondary | ICD-10-CM | POA: Insufficient documentation

## 2020-01-08 DIAGNOSIS — F17210 Nicotine dependence, cigarettes, uncomplicated: Secondary | ICD-10-CM | POA: Insufficient documentation

## 2020-01-08 LAB — COMPREHENSIVE METABOLIC PANEL
ALT: 12 U/L (ref 0–44)
AST: 13 U/L — ABNORMAL LOW (ref 15–41)
Albumin: 3.9 g/dL (ref 3.5–5.0)
Alkaline Phosphatase: 83 U/L (ref 38–126)
Anion gap: 7 (ref 5–15)
BUN: 11 mg/dL (ref 6–20)
CO2: 27 mmol/L (ref 22–32)
Calcium: 8.8 mg/dL — ABNORMAL LOW (ref 8.9–10.3)
Chloride: 104 mmol/L (ref 98–111)
Creatinine, Ser: 1.08 mg/dL (ref 0.61–1.24)
GFR calc Af Amer: >60 mL/min (ref >60)
GFR calc non Af Amer: >60 mL/min (ref >60)
Glucose, Bld: 103 mg/dL — ABNORMAL HIGH (ref 70–99)
Potassium: 3.8 mmol/L (ref 3.5–5.1)
Sodium: 138 mmol/L (ref 135–145)
Total Bilirubin: 0.6 mg/dL (ref 0.3–1.2)
Total Protein: 7.9 g/dL (ref 6.5–8.1)

## 2020-01-08 LAB — CBC
HCT: 40.1 % (ref 39.0–52.0)
Hemoglobin: 14.4 g/dL (ref 13.0–17.0)
MCH: 30.8 pg (ref 26.0–34.0)
MCHC: 35.9 g/dL (ref 30.0–36.0)
MCV: 85.9 fL (ref 80.0–100.0)
Platelets: 235 10*3/uL (ref 150–400)
RBC: 4.67 MIL/uL (ref 4.22–5.81)
RDW: 14.2 % (ref 11.5–15.5)
WBC: 6 10*3/uL (ref 4.0–10.5)
nRBC: 0 % (ref 0.0–0.2)

## 2020-01-08 LAB — TYPE AND SCREEN
ABO/RH(D): O POS
Antibody Screen: NEGATIVE

## 2020-01-08 MED ORDER — POLYETHYLENE GLYCOL 3350 17 GM/SCOOP PO POWD: ORAL | 0 refills | Status: DC

## 2020-01-08 MED ORDER — SENNOSIDES-DOCUSATE SODIUM 8.6-50 MG PO TABS: 2.0000 | ORAL_TABLET | Freq: Two times a day (BID) | ORAL | 0 refills | Status: DC

## 2020-01-08 MED ORDER — IOHEXOL 300 MG/ML  SOLN
100.0000 mL | Freq: Once | INTRAMUSCULAR | Status: AC | PRN
  Administered 2020-01-08: 100 mL via INTRAVENOUS

## 2020-01-08 MED ORDER — SODIUM CHLORIDE 0.9 % IV BOLUS
1000.0000 mL | Freq: Once | INTRAVENOUS | Status: AC
  Administered 2020-01-08: 15:00:00 1000 mL via INTRAVENOUS

## 2020-01-08 NOTE — ED Triage Notes (Signed)
Pt c/o dark red blood in stools for two weeks. No fever or abdominal pain.  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  No vomiting. NAD.

## 2020-01-08 NOTE — ED Provider Notes (Addendum)
[facility] Emergency Department Provider Note  ____________________________________________   First MD Initiated Contact with Patient 01/08/20 1406     (approximate)  I have reviewed the triage vital signs and the nursing notes.  History  Chief Complaint Blood In Stools    HPI Barry [name]. is a 54 y.o. male with no significant medical history who presents to the emergency department for bloody stools.  Patient states he has had bloody stools for the last 7 to 10 days.  He reports seeing dark red blood mixed in his stool, and occasionally sees some when wiping.  Denies any black stools.  Denies any frank abdominal pain.  He does report often getting the sensation that he needs to have a bowel movement, but when he goes to the restroom he only passes liquid, which is often bloody.  He is not on any blood thinning medications.  Patient states on a prior CT scan he was told there was a possible bone in his colon, and is worried this might be contributing to his symptoms.  On chart review, it does appear he had a CT A/P done May 2016 that showed a possible ingested bone in his sigmoid colonic wall. He denies any nausea, vomiting.  Still able to tolerate by mouth. Never had colonoscopy before.   Past Medical Hx Past Medical History:  Diagnosis Date  . Headache   . Left shoulder pain     Problem List Patient Active Problem List   Diagnosis Date Noted  . WEIGHT LOSS 10/25/2010    Past Surgical Hx Past Surgical History:  Procedure Laterality Date  . ROTATOR CUFF REPAIR Left     Medications Prior to Admission medications   Medication Sig Start Date End Date Taking? Authorizing Provider  ibuprofen (ADVIL,MOTRIN) 600 MG tablet Take 1 tablet (600 mg total) by mouth every 6 (six) hours as needed. 11/17/18   Domenic Moras, PA-C  oxyCODONE-acetaminophen (PERCOCET) 5-325 MG tablet Take 1 tablet by mouth every 6 (six) hours as needed for severe pain. 08/11/17    Johnn Hai, PA-C  penicillin v potassium (VEETID) 500 MG tablet Take 1 tablet (500 mg total) by mouth 3 (three) times daily. 11/17/18   Domenic Moras, PA-C  sulfamethoxazole-trimethoprim (BACTRIM DS,SEPTRA DS) 800-160 MG tablet Take 1 tablet by mouth 2 (two) times daily. 08/11/17   Johnn Hai, PA-C    Allergies Shellfish allergy  Family Hx History reviewed. No pertinent family history.  Social Hx Social History   Tobacco Use  . Smoking status: Current Every Day Smoker    Types: Cigars  . Smokeless tobacco: Never Used  Substance Use Topics  . Alcohol use: Yes    Comment: 1 beer every day   . Drug use: No     Review of Systems  Constitutional: Negative for fever. Negative for chills. Eyes: Negative for visual changes. ENT: Negative for sore throat. Cardiovascular: Negative for chest pain. Respiratory: Negative for shortness of breath. Gastrointestinal: Negative for nausea. Negative for vomiting. + bloody stool Genitourinary: Negative for dysuria. Musculoskeletal: Negative for leg swelling. Skin: Negative for rash. Neurological: Negative for headaches.   Physical Exam  Vital Signs: ED Triage Vitals  Enc Vitals Group     BP 01/08/20 1147 138/88     Pulse Rate 01/08/20 1147 91     Resp 01/08/20 1147 15     Temp 01/08/20 1147 98.4 F (36.9 C)     Temp Source 01/08/20 1147 Oral  SpO2 01/08/20 1147 100 %     Weight 01/08/20 1145 176 lb (79.8 kg)     Height 01/08/20 1145 5\' 8"  (1.727 m)     Head Circumference --      Peak Flow --      Pain Score 01/08/20 1145 0     Pain Loc --      Pain Edu? --      Excl. in [place]? --     Constitutional: Alert and oriented. Well appearing. NAD.  Head: Normocephalic. Atraumatic. Eyes: Conjunctivae clear. Sclera anicteric. Pupils equal and symmetric. Nose: No masses or lesions. No congestion or rhinorrhea. Mouth/Throat: Wearing mask.  Neck: No stridor. Trachea midline.  Cardiovascular: Normal rate, regular rhythm.  Extremities well perfused. Respiratory: Normal respiratory effort.  Lungs CTAB. Gastrointestinal: Soft. Non-distended. Non-tender.  Genitourinary: RN chaperone present. Minimal stool on glove, but specimen is guaiac positive. Musculoskeletal: No lower extremity edema. No deformities. Neurologic:  Normal speech and language. No gross focal or lateralizing neurologic deficits are appreciated.  Skin: Skin is warm, dry and intact. No rash noted. Psychiatric: Mood and affect are appropriate for situation.    Radiology  CT A/P pending.   Procedures  Procedure(s) performed (including critical care):  Procedures   Initial Impression / Assessment and Plan / MDM / ED Course  54 y.o. male who presents to the ED for bloody stools, as above.  Ddx: internal hemorrhoid, diverticular bleeding, colitis/diverticulitis.  Does have a history of what appeared to be an ingested bone in the wall of the sigmoid colon on CT in 2016, perhaps this is migrating and/or becoming symptomatic.  Will plan for labs, imaging  Labs reveal normal H&H, normal BUN/Cr. Awaiting CT. Patient care transferred due to shift change awaiting imaging.   _______________________________   As part of my medical decision making I have reviewed available labs, radiology tests, reviewed old records/performed chart review.    Final Clinical Impression(s) / ED Diagnosis  Final diagnoses:  Bloody stool       Note:  This document was prepared using Dragon voice recognition software and may include unintentional dictation errors.     Lilia Pro., MD 01/08/20 [phone]

## 2020-01-08 NOTE — ED Notes (Signed)
Assisted Dr. Joan Mayans with rectal exam. Hemoccult test was positive.

## 2020-01-08 NOTE — ED Notes (Signed)
Pt presents to the ED for blood in his stool. Pt states he's having this since last week. Says it is sometimes bright red and sometimes dark red. He was seen last year for what pt states, "bone in my colon." No interventions were done at that time.   On assessment, pt is A&Ox4 and NAD. Denies abdominal pain and NVD at this time.

## 2020-01-08 NOTE — ED Triage Notes (Signed)
FIRST NURSE NOTE- pt c/o dark red blood with bowel movement.  Reports was told has bone stuck in colon but it was ok to leave it as long as not causing problems. Ambulatory. NAD

## 2020-01-08 NOTE — Discharge Instructions (Addendum)
Your CT scan today does not show any acute issues.  The suspected bone that was seen many years ago was still present today.  You should follow up with gastroenterology for further evaluation of these findings and evaluation of your bloody stool symptoms.

## 2020-11-19 ENCOUNTER — Emergency Department
Admission: EM | Admit: 2020-11-19 | Discharge: 2020-11-19 | Disposition: A | Discharge status: Home / Self Care | Payer: Self-pay | Attending: Emergency Medicine | Admitting: Emergency Medicine

## 2020-11-19 ENCOUNTER — Other Ambulatory Visit: Payer: Self-pay

## 2020-11-19 DIAGNOSIS — F17290 Nicotine dependence, other tobacco product, uncomplicated: Secondary | ICD-10-CM | POA: Insufficient documentation

## 2020-11-19 DIAGNOSIS — K0530 Chronic periodontitis, unspecified: Secondary | ICD-10-CM | POA: Insufficient documentation

## 2020-11-19 MED ORDER — IBUPROFEN 800 MG PO TABS
800.0000 mg | ORAL_TABLET | Freq: Once | ORAL | Status: AC
  Administered 2020-11-19: 800 mg via ORAL
  Filled 2020-11-19: qty 1

## 2020-11-19 MED ORDER — AMOXICILLIN 500 MG PO CAPS
1000.0000 mg | ORAL_CAPSULE | Freq: Once | ORAL | Status: AC
  Administered 2020-11-19: 1000 mg via ORAL
  Filled 2020-11-19: qty 2

## 2020-11-19 MED ORDER — CHLORHEXIDINE GLUCONATE 0.12 % MT SOLN: 10.0000 mL | Freq: Two times a day (BID) | OROMUCOSAL | 2 refills | Status: DC

## 2020-11-19 MED ORDER — AMOXICILLIN 875 MG PO TABS: 875.0000 mg | ORAL_TABLET | Freq: Two times a day (BID) | ORAL | 0 refills | Status: DC

## 2020-11-19 MED ORDER — LIDOCAINE VISCOUS HCL 2 % MT SOLN: 10.0000 mL | OROMUCOSAL | 0 refills | Status: DC | PRN

## 2020-11-19 MED ORDER — IBUPROFEN 800 MG PO TABS: 800.0000 mg | ORAL_TABLET | Freq: Three times a day (TID) | ORAL | 0 refills | Status: DC | PRN

## 2020-11-19 NOTE — ED Triage Notes (Signed)
Pt states this morning he began to have top and bottom pain and swelling to his gums. No swelling noted at this time, pt denies fevers or drainage from gums at this time.

## 2020-11-19 NOTE — ED Provider Notes (Signed)
[facility] Emergency Department Provider Note  ____________________________________________  Time seen: Approximately 9:04 PM  I have reviewed the triage vital signs and the nursing notes.   HISTORY  Chief Complaint Gingivitis    HPI Barry [name]. is a 55 y.o. male who presents the emergency department complaining of gum irritation, pain to left dentition.  Patient states that he has poor dentition and is followed by [facility] dental [facility] and they are planning to remove all of his teeth.  Patient states that he will intermittently have flares most of the time on the left side.  No recent dental trauma or injury.  Patient denies any fevers or chills, difficulty breathing or swallowing.  No other complaints at this time.         Past Medical History:  Diagnosis Date  . Headache   . Left shoulder pain     Patient Active Problem List   Diagnosis Date Noted  . WEIGHT LOSS 10/25/2010    Past Surgical History:  Procedure Laterality Date  . ROTATOR CUFF REPAIR Left     Prior to Admission medications   Medication Sig Start Date End Date Taking? Authorizing Provider  amoxicillin (AMOXIL) 875 MG tablet Take 1 tablet (875 mg total) by mouth 2 (two) times daily. 11/19/20  Yes Karalynn Cottone, Charline Bills, PA-C  chlorhexidine (PERIDEX) 0.12 % solution Use as directed 10 mLs in the mouth or throat 2 (two) times daily. Swish and spit 11/19/20  Yes Remonia Otte, Charline Bills, PA-C  ibuprofen (ADVIL) 800 MG tablet Take 1 tablet (800 mg total) by mouth every 8 (eight) hours as needed. 11/19/20  Yes Atonya Templer, Charline Bills, PA-C  lidocaine (XYLOCAINE) 2 % solution Use as directed 10 mLs in the mouth or throat every 4 (four) hours as needed for mouth pain. Swish and spit 11/19/20  Yes Joreen Swearingin, Charline Bills, PA-C  oxyCODONE-acetaminophen (PERCOCET) 5-325 MG tablet Take 1 tablet by mouth every 6 (six) hours as needed for severe pain. 08/11/17   Johnn Hai, PA-C  penicillin v  potassium (VEETID) 500 MG tablet Take 1 tablet (500 mg total) by mouth 3 (three) times daily. 11/17/18   Domenic Moras, PA-C  polyethylene glycol powder [facility]) 17 GM/SCOOP powder 1 cap full in a full glass of water, once a day as needed for constipation 01/08/20   Carrie Mew, MD  senna-docusate (SENOKOT-S) 8.6-50 MG tablet Take 2 tablets by mouth 2 (two) times daily. 01/08/20   Carrie Mew, MD  sulfamethoxazole-trimethoprim (BACTRIM DS,SEPTRA DS) 800-160 MG tablet Take 1 tablet by mouth 2 (two) times daily. 08/11/17   Johnn Hai, PA-C    Allergies Shellfish allergy  No family history on file.  Social History Social History   Tobacco Use  . Smoking status: Current Every Day Smoker    Types: Cigars  . Smokeless tobacco: Never Used  Substance Use Topics  . Alcohol use: Not Currently    Comment: 1 beer every day   . Drug use: No     Review of Systems  Constitutional: No fever/chills Eyes: No visual changes. No discharge ENT: Left dental pain Cardiovascular: no chest pain. Respiratory: no cough. No SOB. Gastrointestinal: No abdominal pain.  No nausea, no vomiting.  No diarrhea.  No constipation. Musculoskeletal: Negative for musculoskeletal pain. Skin: Negative for rash, abrasions, lacerations, ecchymosis. Neurological: Negative for headaches, focal weakness or numbness.  10 System ROS otherwise negative.  ____________________________________________   PHYSICAL EXAM:  VITAL SIGNS: ED Triage Vitals  Enc Vitals  Group     BP 11/19/20 2015 (!) 141/91     Pulse Rate 11/19/20 2015 93     Resp 11/19/20 2015 18     Temp 11/19/20 2015 99.1 F (37.3 C)     Temp Source 11/19/20 2015 Oral     SpO2 11/19/20 2015 93 %     Weight 11/19/20 2013 160 lb (72.6 kg)     Height 11/19/20 2013 5\' 8"  (1.727 m)     Head Circumference --      Peak Flow --      Pain Score 11/19/20 2013 9     Pain Loc --      Pain Edu? --      Excl. in [place]? --      Constitutional:  Alert and oriented. Well appearing and in no acute distress. Eyes: Conjunctivae are normal. PERRL. EOMI. Head: Atraumatic. ENT:      Ears:       Nose: No congestion/rhinnorhea.      Mouth/Throat: Mucous membranes are moist.  Patient has diffuse gingivitis/periodontitis on physical exam.  No appreciable abscess requiring incision and drainage.  Uvula is midline.  Oropharynx is nonerythematous and nonedematous. Neck: No stridor.   Hematological/Lymphatic/Immunilogical: No cervical lymphadenopathy. Cardiovascular: Normal rate, regular rhythm. Normal S1 and S2.  Good peripheral circulation. Respiratory: Normal respiratory effort without tachypnea or retractions. Lungs CTAB. Good air entry to the bases with no decreased or absent breath sounds. Musculoskeletal: Full range of motion to all extremities. No gross deformities appreciated. Neurologic:  Normal speech and language. No gross focal neurologic deficits are appreciated.  Skin:  Skin is warm, dry and intact. No rash noted. Psychiatric: Mood and affect are normal. Speech and behavior are normal. Patient exhibits appropriate insight and judgement.   ____________________________________________   LABS (all labs ordered are listed, but only abnormal results are displayed)  Labs Reviewed - No data to display ____________________________________________  EKG   ____________________________________________  RADIOLOGY   No results found.  ____________________________________________    PROCEDURES  Procedure(s) performed:    Procedures    Medications - No data to display   ____________________________________________   INITIAL IMPRESSION / ASSESSMENT AND PLAN / ED COURSE  Pertinent labs & imaging results that were available during my care of the patient were reviewed by me and considered in my medical decision making (see chart for details).  Review of the [place] CSRS was performed in accordance of the [place] prior to  dispensing any controlled drugs.           Patient's diagnosis is consistent with parotitis.  Patient presented to the emergency department with diffuse gingival irritation.  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  Recommended the patient has removal of all of his dentition.  Patient has an appointment to see [facility] in a week's time.  No fevers or chills or difficulty swallowing.  No appreciable abscess requiring incision and drainage.  No indication for labs or imaging.  Patient will be prescribed amoxicillin, Peridex, viscous lidocaine and ibuprofen.  He will follow at [facility] next week at his appointment.  Patient is given ED precautions to return to the ED for any worsening or new symptoms.     ____________________________________________  FINAL CLINICAL IMPRESSION(S) / ED DIAGNOSES  Final diagnoses:  Periodontitis      NEW MEDICATIONS STARTED DURING THIS VISIT:  ED Discharge Orders         Ordered    amoxicillin (AMOXIL) 875 MG tablet  2 times daily        11/19/20 2158    ibuprofen (ADVIL) 800 MG tablet  Every 8 hours PRN        11/19/20 2158    chlorhexidine (PERIDEX) 0.12 % solution  2 times daily        11/19/20 2158    lidocaine (XYLOCAINE) 2 % solution  Every 4 hours PRN        11/19/20 2158              This chart was dictated using voice recognition software/Dragon. Despite best efforts to proofread, errors can occur which can change the meaning. Any change was purely unintentional.    Darletta Moll, PA-C 11/19/20 2201    Carrie Mew, MD 11/19/20 2358

## 2021-02-05 ENCOUNTER — Emergency Department: Payer: Self-pay

## 2021-02-05 ENCOUNTER — Observation Stay
Admission: EM | Admit: 2021-02-05 | Discharge: 2021-02-06 | Disposition: A | Discharge status: Home / Self Care | Payer: Self-pay | Attending: Internal Medicine | Admitting: Internal Medicine

## 2021-02-05 ENCOUNTER — Encounter: Payer: Self-pay | Admitting: Emergency Medicine

## 2021-02-05 ENCOUNTER — Other Ambulatory Visit: Payer: Self-pay

## 2021-02-05 DIAGNOSIS — F17213 Nicotine dependence, cigarettes, with withdrawal: Secondary | ICD-10-CM

## 2021-02-05 DIAGNOSIS — K5902 Outlet dysfunction constipation: Secondary | ICD-10-CM

## 2021-02-05 DIAGNOSIS — F17203 Nicotine dependence unspecified, with withdrawal: Secondary | ICD-10-CM | POA: Diagnosis present

## 2021-02-05 DIAGNOSIS — R42 Dizziness and giddiness: Secondary | ICD-10-CM

## 2021-02-05 DIAGNOSIS — K6289 Other specified diseases of anus and rectum: Secondary | ICD-10-CM

## 2021-02-05 DIAGNOSIS — C20 Malignant neoplasm of rectum: Principal | ICD-10-CM | POA: Insufficient documentation

## 2021-02-05 DIAGNOSIS — D124 Benign neoplasm of descending colon: Secondary | ICD-10-CM | POA: Insufficient documentation

## 2021-02-05 DIAGNOSIS — D649 Anemia, unspecified: Secondary | ICD-10-CM

## 2021-02-05 DIAGNOSIS — K5900 Constipation, unspecified: Secondary | ICD-10-CM

## 2021-02-05 DIAGNOSIS — Z20822 Contact with and (suspected) exposure to covid-19: Secondary | ICD-10-CM | POA: Insufficient documentation

## 2021-02-05 DIAGNOSIS — F17290 Nicotine dependence, other tobacco product, uncomplicated: Secondary | ICD-10-CM | POA: Insufficient documentation

## 2021-02-05 DIAGNOSIS — K639 Disease of intestine, unspecified: Secondary | ICD-10-CM | POA: Diagnosis present

## 2021-02-05 DIAGNOSIS — E871 Hypo-osmolality and hyponatremia: Secondary | ICD-10-CM

## 2021-02-05 DIAGNOSIS — D125 Benign neoplasm of sigmoid colon: Secondary | ICD-10-CM | POA: Insufficient documentation

## 2021-02-05 DIAGNOSIS — R55 Syncope and collapse: Secondary | ICD-10-CM

## 2021-02-05 DIAGNOSIS — D500 Iron deficiency anemia secondary to blood loss (chronic): Secondary | ICD-10-CM

## 2021-02-05 LAB — CBC
HCT: 25.1 % — ABNORMAL LOW (ref 39.0–52.0)
HCT: 23.6 % — ABNORMAL LOW (ref 39.0–52.0)
Hemoglobin: 7.9 g/dL — ABNORMAL LOW (ref 13.0–17.0)
Hemoglobin: 7.3 g/dL — ABNORMAL LOW (ref 13.0–17.0)
MCH: 21 pg — ABNORMAL LOW (ref 26.0–34.0)
MCH: 20.6 pg — ABNORMAL LOW (ref 26.0–34.0)
MCHC: 31.5 g/dL (ref 30.0–36.0)
MCHC: 30.9 g/dL (ref 30.0–36.0)
MCV: 66.6 fL — ABNORMAL LOW (ref 80.0–100.0)
MCV: 66.7 fL — ABNORMAL LOW (ref 80.0–100.0)
Platelets: 500 10*3/uL — ABNORMAL HIGH (ref 150–400)
Platelets: 477 10*3/uL — ABNORMAL HIGH (ref 150–400)
RBC: 3.77 MIL/uL — ABNORMAL LOW (ref 4.22–5.81)
RBC: 3.54 MIL/uL — ABNORMAL LOW (ref 4.22–5.81)
RDW: 16.8 % — ABNORMAL HIGH (ref 11.5–15.5)
RDW: 16.7 % — ABNORMAL HIGH (ref 11.5–15.5)
WBC: 16.7 10*3/uL — ABNORMAL HIGH (ref 4.0–10.5)
WBC: 18.2 10*3/uL — ABNORMAL HIGH (ref 4.0–10.5)
nRBC: 0.4 % — ABNORMAL HIGH (ref 0.0–0.2)
nRBC: 0.2 % (ref 0.0–0.2)

## 2021-02-05 LAB — COMPREHENSIVE METABOLIC PANEL
ALT: 11 U/L (ref 0–44)
ALT: 13 U/L (ref 0–44)
AST: 16 U/L (ref 15–41)
AST: 14 U/L — ABNORMAL LOW (ref 15–41)
Albumin: 3 g/dL — ABNORMAL LOW (ref 3.5–5.0)
Albumin: 3.2 g/dL — ABNORMAL LOW (ref 3.5–5.0)
Alkaline Phosphatase: 78 U/L (ref 38–126)
Alkaline Phosphatase: 79 U/L (ref 38–126)
Anion gap: 9 (ref 5–15)
Anion gap: 11 (ref 5–15)
BUN: 12 mg/dL (ref 6–20)
BUN: 11 mg/dL (ref 6–20)
CO2: 25 mmol/L (ref 22–32)
CO2: 24 mmol/L (ref 22–32)
Calcium: 8.5 mg/dL — ABNORMAL LOW (ref 8.9–10.3)
Calcium: 8.5 mg/dL — ABNORMAL LOW (ref 8.9–10.3)
Chloride: 98 mmol/L (ref 98–111)
Chloride: 99 mmol/L (ref 98–111)
Creatinine, Ser: 1.18 mg/dL (ref 0.61–1.24)
Creatinine, Ser: 1.14 mg/dL (ref 0.61–1.24)
GFR, Estimated: >60 mL/min (ref >60)
GFR, Estimated: >60 mL/min (ref >60)
Glucose, Bld: 109 mg/dL — ABNORMAL HIGH (ref 70–99)
Glucose, Bld: 89 mg/dL (ref 70–99)
Potassium: 4 mmol/L (ref 3.5–5.1)
Potassium: 4.1 mmol/L (ref 3.5–5.1)
Sodium: 132 mmol/L — ABNORMAL LOW (ref 135–145)
Sodium: 134 mmol/L — ABNORMAL LOW (ref 135–145)
Total Bilirubin: 0.5 mg/dL (ref 0.3–1.2)
Total Bilirubin: 0.5 mg/dL (ref 0.3–1.2)
Total Protein: 8.4 g/dL — ABNORMAL HIGH (ref 6.5–8.1)
Total Protein: 8.3 g/dL — ABNORMAL HIGH (ref 6.5–8.1)

## 2021-02-05 LAB — RESP PANEL BY RT-PCR (FLU A&B, COVID) ARPGX2
Influenza A by PCR: NEGATIVE
Influenza B by PCR: NEGATIVE
SARS Coronavirus 2 by RT PCR: NEGATIVE

## 2021-02-05 LAB — RETICULOCYTES
Immature Retic Fract: 28.6 % — ABNORMAL HIGH (ref 2.3–15.9)
RBC.: 3.66 MIL/uL — ABNORMAL LOW (ref 4.22–5.81)
Retic Count, Absolute: 49 10*3/uL (ref 19.0–186.0)
Retic Ct Pct: 1.3 % (ref 0.4–3.1)

## 2021-02-05 LAB — MAGNESIUM: Magnesium: 2.2 mg/dL (ref 1.7–2.4)

## 2021-02-05 LAB — URINALYSIS, COMPLETE (UACMP) WITH MICROSCOPIC
Bacteria, UA: NONE SEEN
Bilirubin Urine: NEGATIVE
Glucose, UA: NEGATIVE mg/dL
Hgb urine dipstick: NEGATIVE
Ketones, ur: NEGATIVE mg/dL
Leukocytes,Ua: NEGATIVE
Nitrite: NEGATIVE
Protein, ur: NEGATIVE mg/dL
Specific Gravity, Urine: 1.019 (ref 1.005–1.030)
Squamous Epithelial / HPF: NONE SEEN (ref 0–5)
pH: 5 (ref 5.0–8.0)

## 2021-02-05 LAB — LIPASE, BLOOD: Lipase: 27 U/L (ref 11–51)

## 2021-02-05 LAB — HIV ANTIBODY (ROUTINE TESTING W REFLEX): HIV Screen 4th Generation wRfx: NONREACTIVE

## 2021-02-05 LAB — FOLATE: Folate: 15.2 ng/mL (ref >5.9)

## 2021-02-05 LAB — IRON AND TIBC
Iron: 9 ug/dL — ABNORMAL LOW (ref 45–182)
Saturation Ratios: 2 % — ABNORMAL LOW (ref 17.9–39.5)
TIBC: 378 ug/dL (ref 250–450)
UIBC: 369 ug/dL

## 2021-02-05 LAB — APTT: aPTT: 36 seconds (ref 24–36)

## 2021-02-05 LAB — FERRITIN: Ferritin: 22 ng/mL — ABNORMAL LOW (ref 24–336)

## 2021-02-05 LAB — PROTIME-INR
INR: 1.1 (ref 0.8–1.2)
Prothrombin Time: 14.4 seconds (ref 11.4–15.2)

## 2021-02-05 LAB — VITAMIN B12: Vitamin B-12: 222 pg/mL (ref 180–914)

## 2021-02-05 MED ORDER — SODIUM CHLORIDE 0.9 % IV SOLN
2.0000 g | Freq: Once | INTRAVENOUS | Status: DC
  Filled 2021-02-05: qty 20

## 2021-02-05 MED ORDER — NICOTINE 21 MG/24HR TD PT24
21.0000 mg | MEDICATED_PATCH | Freq: Every day | TRANSDERMAL | Status: DC
  Administered 2021-02-05 – 2021-02-06 (×2): 21 mg via TRANSDERMAL
  Filled 2021-02-05 (×2): qty 1

## 2021-02-05 MED ORDER — SODIUM CHLORIDE 0.9 % IV SOLN
300.0000 mg | Freq: Once | INTRAVENOUS | Status: AC
  Administered 2021-02-05: 300 mg via INTRAVENOUS
  Filled 2021-02-05: qty 15

## 2021-02-05 MED ORDER — SODIUM CHLORIDE 0.9 % IV SOLN: INTRAVENOUS | Status: DC

## 2021-02-05 MED ORDER — METRONIDAZOLE 500 MG/100ML IV SOLN
500.0000 mg | Freq: Three times a day (TID) | INTRAVENOUS | Status: DC
  Filled 2021-02-05: qty 100

## 2021-02-05 MED ORDER — ACETAMINOPHEN 325 MG PO TABS
650.0000 mg | ORAL_TABLET | Freq: Four times a day (QID) | ORAL | Status: DC | PRN
  Administered 2021-02-06 (×2): 650 mg via ORAL
  Filled 2021-02-05 (×2): qty 2

## 2021-02-05 MED ORDER — LACTATED RINGERS IV SOLN: INTRAVENOUS | Status: DC

## 2021-02-05 MED ORDER — ONDANSETRON HCL 4 MG/2ML IJ SOLN: 4.0000 mg | Freq: Four times a day (QID) | INTRAMUSCULAR | Status: DC | PRN

## 2021-02-05 MED ORDER — ACETAMINOPHEN 650 MG RE SUPP: 650.0000 mg | Freq: Four times a day (QID) | RECTAL | Status: DC | PRN

## 2021-02-05 MED ORDER — POLYETHYLENE GLYCOL 3350 17 GM/SCOOP PO POWD
1.0000 | Freq: Once | ORAL | Status: AC
  Administered 2021-02-05: 255 g via ORAL
  Filled 2021-02-05: qty 255

## 2021-02-05 MED ORDER — PIPERACILLIN-TAZOBACTAM 3.375 G IVPB
3.3750 g | Freq: Three times a day (TID) | INTRAVENOUS | Status: DC
  Administered 2021-02-05 – 2021-02-06 (×4): 3.375 g via INTRAVENOUS
  Filled 2021-02-05 (×4): qty 50

## 2021-02-05 MED ORDER — ONDANSETRON HCL 4 MG PO TABS: 4.0000 mg | ORAL_TABLET | Freq: Four times a day (QID) | ORAL | Status: DC | PRN

## 2021-02-05 MED ORDER — LACTATED RINGERS IV BOLUS
1000.0000 mL | Freq: Once | INTRAVENOUS | Status: AC
  Administered 2021-02-05: 1000 mL via INTRAVENOUS

## 2021-02-05 NOTE — Consult Note (Signed)
Pharmacy Antibiotic Note  Barry Sells. is a 55 y.o. male admitted on 02/05/2021 with intra-abdominal infection.  Pharmacy has been consulted for Zosyn dosing.  Plan: Zosyn 3.375g IV q8h (4 hour infusion).  Height: 5\' 8"  (172.7 cm) Weight: 72.6 kg (160 lb 0.9 oz) IBW/kg (Calculated) : 68.4  Temp (24hrs), Avg:100 F (37.8 C), Min:100 F (37.8 C), Max:100 F (37.8 C)  Recent Labs  Lab 02/05/21 1028  WBC 16.7*  CREATININE 1.18    Estimated Creatinine Clearance: 70 mL/min (by C-G formula based on SCr of 1.18 mg/dL).    Allergies  Allergen Reactions  . Shellfish Allergy Swelling    Antimicrobials this admission: Zosyn 5/31 >>   Microbiology results: 5/31 BCx: pending   Thank you for allowing pharmacy to be a part of this patient's care.  Barry [name] A Carena Stream 02/05/2021 1:44 PM

## 2021-02-05 NOTE — ED Triage Notes (Signed)
C/O difficulty moving bowels x 6 months.  States has urge to move bowels and then cannot.  Also at times passes stool, but stool is hard.  AAOx3.  Skin warm and dry. NAD

## 2021-02-05 NOTE — H&P (Signed)
History and Physical    Barry [name]. GEX:[id number] DOB: 03/31/1966 DOA: 02/05/2021  PCP: Patient, No Pcp Per (Inactive)  Patient coming from: Home   Chief Complaint:  Chief Complaint  Patient presents with  . Constipation     HPI:    55 year old with past medical history Pentids who presents to [facility] emergency department with complaints of weakness lightheadedness and constipation.  Of note, patient has had a12 mm density of the sigmoid colon concerning for ingested "bone fragment" first identified in 2016 that was never addressed in the outpatient setting.  This questionable bone fragment was identified once again in 01/2020 but again it was never addressed in the outpatient setting.  Patient reports never having had a colonoscopy.   Patient explains that for at least the past 6 months he has developed severe constipation.  Patient states that since approximately 6 months ago whenever he attempts to move his bowels he typically can only expel small amounts of liquid stool.  Patient is only been able to expel large solid stools approximately 1 time every month.  In the past several months, this liquid stool has become bloody with bright red blood.  Patient is additionally developed difficulty with urination and must sit down to urinate.  Additionally, when the patient attempts to urinate blood now comes out of his rectum simultaneously.  Patient complains of associated rectal pain has been persisting over the span of time.  This pain is mild to moderate intensity, worsened with any attempt at moving his bowels.  Patient also has been experiencing associated left lower quadrant abdominal pain that is more intermittent, sharp and typically only occurs when the patient is attempting to move his bowels.  Patient also complains of a palpated fullness in left lower quadrant that has worsened over the span of time.    As the patient's symptoms have worsened patient has also experienced a  decrease in appetite due to the patient feeling "bloated" all the time.  Patient also complains of a 30 pound weight loss that he has experienced.  Denies fevers, sick contacts, recent travel.  Patient denies being on any other prescription medications.  In the past several weeks in particular the patient has felt severe generalized weakness associated with bouts of lightheadedness.   Lightheaded episodes typically occur upon rising from a seated position.  It was an episode of severe weakness and lightheadedness while the patient was at work that prompted the patient to present to [facility] emergency department earlier today.  Upon evaluation in the emergency department, patient has been found to have concerning findings on CT imaging of the abdomen revealing severe rectal wall thickening with a 3.1 x 2.3 cm right internal iliac lymph node concerning for metastatic disease.  Rectal wall thickening is concerning for a rectal malignancy versus proctitis.  Patient was found to be quite anemic with a hemoglobin of 7.9.  Patient was also found to have a substantial leukocytosis of 16.7 and therefore placed on intravenous antibiotics.  ER provider then discussed case with Dr. Marius Ditch with neurology who agreed to come see the patient in consultation and asked that the patient be placed on clear liquid diet for now.  The hospitalist group was then called to assess the patient for admission to the hospital.  Review of Systems:   Review of Systems  Constitutional: Positive for malaise/fatigue and weight loss.  Gastrointestinal: Positive for abdominal pain, blood in stool and constipation.  Neurological: Positive for weakness.  All other systems  reviewed and are negative.   Past Medical History:  Diagnosis Date  . Headache   . Left shoulder pain     Past Surgical History:  Procedure Laterality Date  . ROTATOR CUFF REPAIR Left      reports that he has been smoking cigars. He has never used smokeless tobacco.  He reports previous alcohol use. He reports that he does not use drugs.  Allergies  Allergen Reactions  . Shellfish Allergy Swelling    Family History  Problem Relation Age of Onset  . Cancer Sister      Prior to Admission medications   Medication Sig Start Date End Date Taking? Authorizing Provider  acetaminophen (TYLENOL) 325 MG tablet Take 650 mg by mouth every 6 (six) hours as needed.   Yes [provider]  alum & mag hydroxide-simeth (MAALOX/MYLANTA) 200-200-20 MG/5ML suspension Take 15 mLs by mouth daily.   Yes [provider]  Multiple Vitamin (MULTIVITAMIN WITH MINERALS) TABS tablet Take 1 tablet by mouth daily.   Yes [provider]  amoxicillin (AMOXIL) 875 MG tablet Take 1 tablet (875 mg total) by mouth 2 (two) times daily. Patient not taking: Reported on 02/05/2021 11/19/20   Cuthriell, Charline Bills, PA-C  chlorhexidine (PERIDEX) 0.12 % solution Use as directed 10 mLs in the mouth or throat 2 (two) times daily. Swish and spit Patient not taking: Reported on 02/05/2021 11/19/20   Cuthriell, Charline Bills, PA-C  ibuprofen (ADVIL) 800 MG tablet Take 1 tablet (800 mg total) by mouth every 8 (eight) hours as needed. Patient not taking: Reported on 02/05/2021 11/19/20   Cuthriell, Charline Bills, PA-C  lidocaine (XYLOCAINE) 2 % solution Use as directed 10 mLs in the mouth or throat every 4 (four) hours as needed for mouth pain. Swish and spit Patient not taking: Reported on 02/05/2021 11/19/20   Cuthriell, Charline Bills, PA-C  oxyCODONE-acetaminophen (PERCOCET) 5-325 MG tablet Take 1 tablet by mouth every 6 (six) hours as needed for severe pain. Patient not taking: Reported on 02/05/2021 08/11/17   Letitia Neri L, PA-C  penicillin v potassium (VEETID) 500 MG tablet Take 1 tablet (500 mg total) by mouth 3 (three) times daily. Patient not taking: Reported on 02/05/2021 11/17/18   Domenic Moras, PA-C  polyethylene glycol powder [facility]) 17 GM/SCOOP powder 1 cap full in  a full glass of water, once a day as needed for constipation Patient not taking: Reported on 02/05/2021 01/08/20   Carrie Mew, MD  senna-docusate (SENOKOT-S) 8.6-50 MG tablet Take 2 tablets by mouth 2 (two) times daily. Patient not taking: Reported on 02/05/2021 01/08/20   Carrie Mew, MD  sulfamethoxazole-trimethoprim (BACTRIM DS,SEPTRA DS) 800-160 MG tablet Take 1 tablet by mouth 2 (two) times daily. Patient not taking: Reported on 02/05/2021 08/11/17   Johnn Hai, PA-C    Physical Exam: Vitals:   02/05/21 0955 02/05/21 1006 02/05/21 1110  BP:  126/86 140/86  Pulse:  (!) 103 100  Resp:  18 18  Temp:  100 F (37.8 C)   TempSrc:  Oral   SpO2:  99% 99%  Weight: 72.6 kg    Height: 5\' 8"  (1.727 m)      Constitutional: Awake alert and oriented x3, no associated distress.   Skin: no rashes, no lesions, poor skin turgor noted. Eyes: Pupils are equally reactive to light.  No evidence of significant conjunctival pallor noted without scleral icterus. ENMT: Somewhat dry mucous membranes noted.  Posterior pharynx clear of any exudate or lesions.   Neck:  normal, supple, no masses, no thyromegaly.  No evidence of jugular venous distension.   Respiratory: clear to auscultation bilaterally, no wheezing, no crackles. Normal respiratory effort. No accessory muscle use.  Cardiovascular: Regular rate and rhythm, no murmurs / rubs / gallops. No extremity edema. 2+ pedal pulses. No carotid bruits.  Chest:   Nontender without crepitus or deformity.   Back:   Nontender without crepitus or deformity. Abdomen: Notable fullness in the right upper quadrant as well as left lower quadrant.  Notable tenderness in the left lower quadrant.  Possible sounds noted in all quadrants.  Abdomen is soft. GU: Small external hemorrhoid noted.  Small amount of gross blood noted coming from the rectum. Musculoskeletal: No joint deformity upper and lower extremities. Good ROM, no contractures. Normal muscle tone.   Neurologic: CN 2-12 grossly intact. Sensation intact.  Patient moving all 4 extremities spontaneously.  Patient is following all commands.  Patient is responsive to verbal stimuli.   Psychiatric: Patient exhibits normal mood with appropriate affect.  Patient seems to possess insight as to their current situation.     Labs on Admission: I have personally reviewed following labs and imaging studies -   CBC: Recent Labs  Lab 02/05/21 1028  WBC 16.7*  HGB 7.9*  HCT 25.1*  MCV 66.6*  PLT 354*   Basic Metabolic Panel: Recent Labs  Lab 02/05/21 1028  NA 132*  K 4.0  CL 98  CO2 25  GLUCOSE 109*  BUN 12  CREATININE 1.18  CALCIUM 8.5*   GFR: Estimated Creatinine Clearance: 70 mL/min (by C-G formula based on SCr of 1.18 mg/dL). Liver Function Tests: Recent Labs  Lab 02/05/21 1028  AST 16  ALT 11  ALKPHOS 78  BILITOT 0.5  PROT 8.4*  ALBUMIN 3.0*   Recent Labs  Lab 02/05/21 1028  LIPASE 27   No results for input(s): AMMONIA in the last 168 hours. Coagulation Profile: No results for input(s): INR, PROTIME in the last 168 hours. Cardiac Enzymes: No results for input(s): CKTOTAL, CKMB, CKMBINDEX, TROPONINI in the last 168 hours. BNP (last 3 results) No results for input(s): PROBNP in the last 8760 hours. HbA1C: No results for input(s): HGBA1C in the last 72 hours. CBG: No results for input(s): GLUCAP in the last 168 hours. Lipid Profile: No results for input(s): CHOL, HDL, LDLCALC, TRIG, CHOLHDL, LDLDIRECT in the last 72 hours. Thyroid Function Tests: No results for input(s): TSH, T4TOTAL, FREET4, T3FREE, THYROIDAB in the last 72 hours. Anemia Panel: No results for input(s): VITAMINB12, FOLATE, FERRITIN, TIBC, IRON, RETICCTPCT in the last 72 hours. Urine analysis:    Component Value Date/Time   COLORURINE YELLOW (A) 02/05/2021 1028   APPEARANCEUR CLEAR (A) 02/05/2021 1028   LABSPEC 1.019 02/05/2021 1028   PHURINE 5.0 02/05/2021 1028   GLUCOSEU NEGATIVE  02/05/2021 1028   HGBUR NEGATIVE 02/05/2021 [place] 02/05/2021 1028   KETONESUR NEGATIVE 02/05/2021 1028   PROTEINUR NEGATIVE 02/05/2021 1028   UROBILINOGEN 0.2 01/15/2015 0808   NITRITE NEGATIVE 02/05/2021 1028   LEUKOCYTESUR NEGATIVE 02/05/2021 1028    Radiological Exams on Admission - Personally Reviewed: CT ABDOMEN PELVIS WO CONTRAST  Result Date: 02/05/2021 CLINICAL DATA:  Elevated white blood count. EXAM: CT ABDOMEN AND PELVIS WITHOUT CONTRAST TECHNIQUE: Multidetector CT imaging of the abdomen and pelvis was performed following the standard protocol without IV contrast. COMPARISON:  Jan 08, 2020. FINDINGS: Lower chest: No acute abnormality. Hepatobiliary: No gallstones or biliary dilatation is noted. Grossly stable 1 cm  low density is noted along inferior portion of right hepatic lobe. Stable probable small cyst is seen posteriorly in the right hepatic lobe. Enhancing abnormality seen on prior exam in left hepatic lobe is not visualized currently due to the lack of intravenous contrast administration. Pancreas: Unremarkable. No pancreatic ductal dilatation or surrounding inflammatory changes. Spleen: Normal in size without focal abnormality. Adrenals/Urinary Tract: Adrenal glands are unremarkable. Kidneys are normal, without renal calculi, focal lesion, or hydronephrosis. Bladder is unremarkable. Stomach/Bowel: The stomach appears normal. The appendix appears normal. There is no evidence of bowel obstruction. There appears to be significantly increased wall thickening involving the rectum with a significantly increased amount of inflammatory change in the perirectal soft tissues. This is concerning for rectal malignancy or possibly severe proctitis. Stable linear high density is seen adjacent to sigmoid colon concerning for ingested bone fragment. Vascular/Lymphatic: Aortic atherosclerosis. 3.1 x 2.3 cm right internal iliac lymph node is noted which is new since prior exam and  highly concerning for metastatic disease. Reproductive: Prostate is unremarkable. Other: No abdominal wall hernia or abnormality. No abdominopelvic ascites. Musculoskeletal: No acute or significant osseous findings. IMPRESSION: Severe rectal wall thickening is noted with surrounding inflammation which is significantly worse compared to prior exam. There also appears to be a 3.1 x 2.3 cm right internal iliac lymph node which is concerning for metastatic disease. These findings are concerning for rectal malignancy or severe proctitis. Sigmoidoscopy is recommended. Enhancing abnormality seen in left hepatic lobe on prior exam is not visualized currently due to lack of intravenous contrast. Electronically Signed   By: Marijo Conception M.D.   On: 02/05/2021 11:30    Assessment/Plan Principal Problem:   Constipation by outlet obstruction with associated severe rectal wall thickening and iliac lymphadenopathy   Patient experiencing at least a 62-month history of severe constipation associated with constant rectal pain, intermittent abdominal pain and 30 pound weight loss.  Concern for severe proctitis versus malignant process.  Placing patient on empiric intravenous antibiotic therapy for now with Zosyn.    Obtaining blood cultures  Clear liquid diet  Additionally, awaiting GI evaluation and consultation.  Their input is appreciated.  Patient will require some degree of endoscopic or surgical intervention.  Of note, patient has had a documented history of a 12 mm density of the sigmoid colon concerning for ingested "bone fragment" 2016 that was never addressed in the outpatient setting.  Clear as to whether or not this has any relation.  Active Problems:   Chronic blood loss anemia   Patient presenting with substantial anemia with hemoglobin of 7.9  Likely secondary to chronic rectal bleeding due to possible malignant process  This is resulted in concurrent lightheadedness and generalized  weakness  Will transfuse if hemoglobin drops below 7  Performing CBCs every 8 hours  Anemia work-up already initiated by ER provider    Postural dizziness with presyncope   Patient complaining of a several week history of generalized weakness and what sounds to be orthostatic presyncope  This is likely secondary to volume depletion and severe anemia  Hydrating patient with intravenous isotonic fluids, will transfuse with packed red blood cells if hemoglobin drops below 7  Will obtain PT evaluation, orthostatic vital signs    Nicotine dependence with withdrawal   Nicotine replacement therapy with nicotine patches  Counseling patient on cessation     Hyponatremia   Mild hypovolemic hyponatremia secondary to poor oral intake  Hydrating patient with intravenous isotonic fluids  Monitoring sodium levels with serial  chemistries   Code Status:  Full code Family Communication: deferred   Status is: Observation  The patient remains OBS appropriate and will d/c before 2 midnights.  Dispo: The patient is from: Home              Anticipated d/c is to: Home              Patient currently is not medically stable to d/c.   Difficult to place patient No        Vernelle Emerald MD [facility] Pager [phone]  If 7PM-7AM, please contact night-coverage [email or web address] Use universal [place] password for that web site. If you do not have the password, please call the hospital operator.  02/05/2021, 1:26 PM

## 2021-02-05 NOTE — ED Notes (Signed)
Called lab to come draw cultures at this time. Per Elmyra Ricks in lab, will send someone to come draw.

## 2021-02-05 NOTE — ED Provider Notes (Signed)
[facility] Emergency Department Provider Note  ____________________________________________   Event Date/Time   First MD Initiated Contact with Patient 02/05/21 1025     (approximate)  I have reviewed the triage vital signs and the nursing notes.   HISTORY  Chief Complaint Constipation    HPI Barry [name]. is a 55 y.o. male here with constipation and abdominal discomfort.  The patient states that for the last 6 months, he has had essentially constant constipation and difficulty having a bowel movement.  He has noticed that his bowel movements have been thin and very difficult to pass.  He feels like something is blocking his bowel movements.  He has had associated abdominal distention, intermittent nausea, and fatigue.  He has had 20 pounds of weight loss.  Denies recent colonoscopy.  Of note, he was seen approximately a year ago, but was unable to follow-up with GI.  Denies any known fevers but has had some chills.  He has had some night sweats.  No other complaints.        Past Medical History:  Diagnosis Date  . Headache   . Left shoulder pain     Patient Active Problem List   Diagnosis Date Noted  . WEIGHT LOSS 10/25/2010    Past Surgical History:  Procedure Laterality Date  . ROTATOR CUFF REPAIR Left     Prior to Admission medications   Medication Sig Start Date End Date Taking? Authorizing Provider  acetaminophen (TYLENOL) 325 MG tablet Take 650 mg by mouth every 6 (six) hours as needed.   Yes [provider]  alum & mag hydroxide-simeth (MAALOX/MYLANTA) 200-200-20 MG/5ML suspension Take 15 mLs by mouth daily.   Yes [provider]  Multiple Vitamin (MULTIVITAMIN WITH MINERALS) TABS tablet Take 1 tablet by mouth daily.   Yes [provider]  amoxicillin (AMOXIL) 875 MG tablet Take 1 tablet (875 mg total) by mouth 2 (two) times daily. Patient not taking: Reported on 02/05/2021 11/19/20   Cuthriell, Charline Bills, PA-C  chlorhexidine (PERIDEX) 0.12 % solution Use as directed 10 mLs in the mouth or throat 2 (two) times daily. Swish and spit Patient not taking: Reported on 02/05/2021 11/19/20   Cuthriell, Charline Bills, PA-C  ibuprofen (ADVIL) 800 MG tablet Take 1 tablet (800 mg total) by mouth every 8 (eight) hours as needed. Patient not taking: Reported on 02/05/2021 11/19/20   Cuthriell, Charline Bills, PA-C  lidocaine (XYLOCAINE) 2 % solution Use as directed 10 mLs in the mouth or throat every 4 (four) hours as needed for mouth pain. Swish and spit Patient not taking: Reported on 02/05/2021 11/19/20   Cuthriell, Charline Bills, PA-C  oxyCODONE-acetaminophen (PERCOCET) 5-325 MG tablet Take 1 tablet by mouth every 6 (six) hours as needed for severe pain. Patient not taking: Reported on 02/05/2021 08/11/17   Letitia Neri L, PA-C  penicillin v potassium (VEETID) 500 MG tablet Take 1 tablet (500 mg total) by mouth 3 (three) times daily. Patient not taking: Reported on 02/05/2021 11/17/18   Domenic Moras, PA-C  polyethylene glycol powder [facility]) 17 GM/SCOOP powder 1 cap full in a full glass of water, once a day as needed for constipation Patient not taking: Reported on 02/05/2021 01/08/20   Carrie Mew, MD  senna-docusate (SENOKOT-S) 8.6-50 MG tablet Take 2 tablets by mouth 2 (two) times daily. Patient not taking: Reported on 02/05/2021 01/08/20   Carrie Mew, MD  sulfamethoxazole-trimethoprim (BACTRIM DS,SEPTRA DS) 800-160 MG tablet Take 1 tablet by mouth  2 (two) times daily. Patient not taking: Reported on 02/05/2021 08/11/17   Johnn Hai, PA-C    Allergies Shellfish allergy  No family history on file.  Social History Social History   Tobacco Use  . Smoking status: Current Every Day Smoker    Types: Cigars  . Smokeless tobacco: Never Used  Substance Use Topics  . Alcohol use: Not Currently    Comment: 1 beer every day   . Drug use: No    Review of Systems  Review of Systems   Constitutional: Positive for fatigue. Negative for chills and fever.  HENT: Negative for sore throat.   Respiratory: Negative for shortness of breath.   Cardiovascular: Negative for chest pain.  Gastrointestinal: Positive for abdominal distention, abdominal pain and constipation.  Genitourinary: Negative for flank pain.  Musculoskeletal: Negative for neck pain.  Skin: Negative for rash and wound.  Allergic/Immunologic: Negative for immunocompromised state.  Neurological: Negative for weakness and numbness.  Hematological: Does not bruise/bleed easily.  All other systems reviewed and are negative.    ____________________________________________  PHYSICAL EXAM:      VITAL SIGNS: ED Triage Vitals  Enc Vitals Group     BP 02/05/21 1006 126/86     Pulse Rate 02/05/21 1006 (!) 103     Resp 02/05/21 1006 18     Temp 02/05/21 1006 100 F (37.8 C)     Temp Source 02/05/21 1006 Oral     SpO2 02/05/21 1006 99 %     Weight 02/05/21 0955 160 lb 0.9 oz (72.6 kg)     Height 02/05/21 0955 5\' 8"  (1.727 m)     Head Circumference --      Peak Flow --      Pain Score 02/05/21 0955 0     Pain Loc --      Pain Edu? --      Excl. in [place]? --      Physical Exam Vitals and nursing note reviewed.  Constitutional:      General: He is not in acute distress.    Appearance: He is well-developed.  HENT:     Head: Normocephalic and atraumatic.  Eyes:     Conjunctiva/sclera: Conjunctivae normal.  Cardiovascular:     Rate and Rhythm: Normal rate and regular rhythm.     Heart sounds: Normal heart sounds. No murmur heard. No friction rub.  Pulmonary:     Effort: Pulmonary effort is normal. No respiratory distress.     Breath sounds: Normal breath sounds. No wheezing or rales.  Abdominal:     General: There is no distension.     Palpations: Abdomen is soft.     Tenderness: There is generalized abdominal tenderness.  Musculoskeletal:     Cervical back: Neck supple.  Skin:    General: Skin is  warm.     Capillary Refill: Capillary refill takes less than 2 seconds.  Neurological:     Mental Status: He is alert and oriented to person, place, and time.     Motor: No abnormal muscle tone.       ____________________________________________   LABS (all labs ordered are listed, but only abnormal results are displayed)  Labs Reviewed  COMPREHENSIVE METABOLIC PANEL - Abnormal; Notable for the following components:      Result Value   Sodium 132 (*)    Glucose, Bld 109 (*)    Calcium 8.5 (*)    Total Protein 8.4 (*)    Albumin 3.0 (*)  All other components within normal limits  CBC - Abnormal; Notable for the following components:   WBC 16.7 (*)    RBC 3.77 (*)    Hemoglobin 7.9 (*)    HCT 25.1 (*)    MCV 66.6 (*)    MCH 21.0 (*)    RDW 16.8 (*)    Platelets 500 (*)    nRBC 0.4 (*)    All other components within normal limits  URINALYSIS, COMPLETE (UACMP) WITH MICROSCOPIC - Abnormal; Notable for the following components:   Color, Urine YELLOW (*)    APPearance CLEAR (*)    All other components within normal limits  CULTURE, BLOOD (ROUTINE X 2)  CULTURE, BLOOD (ROUTINE X 2)  RESP PANEL BY RT-PCR (FLU A&B, COVID) ARPGX2  LIPASE, BLOOD  VITAMIN B12  FOLATE  IRON AND TIBC  FERRITIN  RETICULOCYTES    ____________________________________________  EKG:  ________________________________________  RADIOLOGY All imaging, including plain films, CT scans, and ultrasounds, independently reviewed by me, and interpretations confirmed via formal radiology reads.  ED MD interpretation:   CT abdomen/pelvis: Severe rectal wall thickening consistent with severe proctitis, concern for rectal malignancy  Official radiology report(s): CT ABDOMEN PELVIS WO CONTRAST  Result Date: 02/05/2021 CLINICAL DATA:  Elevated white blood count. EXAM: CT ABDOMEN AND PELVIS WITHOUT CONTRAST TECHNIQUE: Multidetector CT imaging of the abdomen and pelvis was performed following the standard  protocol without IV contrast. COMPARISON:  Jan 08, 2020. FINDINGS: Lower chest: No acute abnormality. Hepatobiliary: No gallstones or biliary dilatation is noted. Grossly stable 1 cm low density is noted along inferior portion of right hepatic lobe. Stable probable small cyst is seen posteriorly in the right hepatic lobe. Enhancing abnormality seen on prior exam in left hepatic lobe is not visualized currently due to the lack of intravenous contrast administration. Pancreas: Unremarkable. No pancreatic ductal dilatation or surrounding inflammatory changes. Spleen: Normal in size without focal abnormality. Adrenals/Urinary Tract: Adrenal glands are unremarkable. Kidneys are normal, without renal calculi, focal lesion, or hydronephrosis. Bladder is unremarkable. Stomach/Bowel: The stomach appears normal. The appendix appears normal. There is no evidence of bowel obstruction. There appears to be significantly increased wall thickening involving the rectum with a significantly increased amount of inflammatory change in the perirectal soft tissues. This is concerning for rectal malignancy or possibly severe proctitis. Stable linear high density is seen adjacent to sigmoid colon concerning for ingested bone fragment. Vascular/Lymphatic: Aortic atherosclerosis. 3.1 x 2.3 cm right internal iliac lymph node is noted which is new since prior exam and highly concerning for metastatic disease. Reproductive: Prostate is unremarkable. Other: No abdominal wall hernia or abnormality. No abdominopelvic ascites. Musculoskeletal: No acute or significant osseous findings. IMPRESSION: Severe rectal wall thickening is noted with surrounding inflammation which is significantly worse compared to prior exam. There also appears to be a 3.1 x 2.3 cm right internal iliac lymph node which is concerning for metastatic disease. These findings are concerning for rectal malignancy or severe proctitis. Sigmoidoscopy is recommended. Enhancing  abnormality seen in left hepatic lobe on prior exam is not visualized currently due to lack of intravenous contrast. Electronically Signed   By: Marijo Conception M.D.   On: 02/05/2021 11:30    ____________________________________________  PROCEDURES   Procedure(s) performed (including Critical Care):  Procedures  ____________________________________________  INITIAL IMPRESSION / MDM / [place] / ED COURSE  As part of my medical decision making, I reviewed the following data within the [facility] notes reviewed and  incorporated, Old chart reviewed, Notes from prior ED visits, and [place] [employer] [place] Barry Jr. was evaluated in Emergency Department on 02/05/2021 for the symptoms described in the history of present illness. He was evaluated in the context of the global COVID-19 pandemic, which necessitated consideration that the patient might be at risk for infection with the SARS-CoV-2 virus that causes COVID-19. Institutional protocols and algorithms that pertain to the evaluation of patients at risk for COVID-19 are in a state of rapid change based on information released by regulatory bodies including the CDC and federal and state organizations. These policies and algorithms were followed during the patient's care in the ED.  Some ED evaluations and interventions may be delayed as a result of limited staffing during the pandemic.*     Medical Decision Making: 55 year old male here with 6 months of constipation, difficulty having a bowel movement, intermittent blood per rectum, and abdominal distention.  Clinically, high concern for possible rectal malignancy.  Lab work shows likely iron deficiency anemia related to chronic GI loss.  Patient also has significant leukocytosis and is borderline febrile, raising question of acute infection.  CT abdomen/pelvis obtained, reviewed, shows severe rectal wall thickening, concerning for  proctitis and or rectal malignancy.  Will place the patient on empiric antibiotics, send cultures.  Discussed with Dr. Marius [name] of GI.  Will admit to medicine. Recommends clear liquids for now.  ____________________________________________  FINAL CLINICAL IMPRESSION(S) / ED DIAGNOSES  Final diagnoses:  Proctitis  Anemia, unspecified type     MEDICATIONS GIVEN DURING THIS VISIT:  Medications  cefTRIAXone (ROCEPHIN) 2 g in sodium chloride 0.9 % 100 mL IVPB (has no administration in time range)  metroNIDAZOLE (FLAGYL) IVPB 500 mg (has no administration in time range)     ED Discharge Orders    None       Note:  This document was prepared using Dragon voice recognition software and may include unintentional dictation errors.   Duffy Bruce, MD 02/05/21 1227

## 2021-02-05 NOTE — ED Notes (Signed)
This RN attempted x 2 for IV access.

## 2021-02-05 NOTE — ED Notes (Signed)
MD Shalhoub informed of being unable to obtain blood cultures at this time. Per MD, give IVF at this time per order. Per MD, wait to give antibiotics until we can obtain at least 1 blood culture.

## 2021-02-05 NOTE — Consult Note (Signed)
Cephas Darby, MD [address]  [place]  [place], [place] [address]  Main: [phone]  Fax: [phone] Pager: [phone]   Consultation  Referring Provider:     No ref. provider found Primary Care Physician:  Patient, No Pcp Per (Inactive) Primary Gastroenterologist:   Althia Forts     Reason for Consultation:     Symptomatic anemia, rectal mass  Date of Admission:  02/05/2021 Date of Consultation:  02/05/2021         HPI:   Barry [name]. is a 55 y.o. male history of tobacco use, who presents with 6 months history of severe rectal pressure, pain, lack of appetite and significant weight loss, approximately 30 pounds.  He reports that his stools are very thin, like a string, associated with difficulty urination, he complains of discomfort in his left lower quadrant.  He does report significant abdominal bloating and flatulence.  Labs revealed severe microcytic anemia, hemoglobin 7.9, MCV 66, thrombocytosis, leukocytosis, WBC count 18.2, no evidence of neutrophilia.  Patient is empirically started on antibiotics.  His iron panel revealed severe iron deficiency, normal folic acid. He underwent CT abdomen and pelvis without contrast during this admission which revealed severe rectal wall thickening with surrounding inflammation, enlarged right internal iliac lymph node 3 x 2.3 cm, concerning for metastatic disease, rectal malignancy or severe proctitis.  Apparently, patient had a CT scan in 5/21 for similar symptoms, was found to have sigmoid and rectal wall thickening and stool occult was also positive.  He did not have anemia at that time.  He was advised to see gastroenterologist, was lost to follow-up.  He had another CT scan of his abdomen in 01/2015 for abdominal pain, he was told that he had 12 mm long high density structure in the wall and pericolonic fat of the sigmoid colon  When I interviewed the patient, his wife is bedside.  NSAIDs:  None  Antiplts/Anticoagulants/Anti thrombotics: None  GI Procedures: None He denies family history of GI malignancy  Past Medical History:  Diagnosis Date  . Headache   . Left shoulder pain     Past Surgical History:  Procedure Laterality Date  . ROTATOR CUFF REPAIR Left     Current Facility-Administered Medications:  .  0.9 %  sodium chloride infusion, , Intravenous, Continuous, Embry Huss, Tally Due, MD, Stopped at 02/05/21 1525 .  acetaminophen (TYLENOL) tablet 650 mg, 650 mg, Oral, Q6H PRN **OR** acetaminophen (TYLENOL) suppository 650 mg, 650 mg, Rectal, Q6H PRN, Shalhoub, Sherryll Burger, MD .  iron sucrose (VENOFER) 300 mg in sodium chloride 0.9 % 250 mL IVPB, 300 mg, Intravenous, Once, Tanasia Budzinski, Tally Due, MD .  Margrett Rud lactated ringers bolus 1,000 mL, 1,000 mL, Intravenous, Once, Stopped at 02/05/21 1559 **FOLLOWED BY** lactated ringers infusion, , Intravenous, Continuous, Shalhoub, Sherryll Burger, MD, Last Rate: 125 mL/hr at 02/05/21 1648, Infusion Verify at 02/05/21 1648 .  nicotine (NICODERM CQ - dosed in mg/24 hours) patch 21 mg, 21 mg, Transdermal, Daily, Shalhoub, Sherryll Burger, MD, 21 mg at 02/05/21 1457 .  ondansetron (ZOFRAN) tablet 4 mg, 4 mg, Oral, Q6H PRN **OR** ondansetron (ZOFRAN) injection 4 mg, 4 mg, Intravenous, Q6H PRN, Shalhoub, Sherryll Burger, MD .  piperacillin-tazobactam (ZOSYN) IVPB 3.375 g, 3.375 g, Intravenous, Q8H, Shalhoub, Sherryll Burger, MD, Last Rate: 12.5 mL/hr at 02/05/21 1648, Infusion Verify at 02/05/21 1648 .  polyethylene glycol powder (GLYCOLAX/MIRALAX) container 255 g, 1 Container, Oral, Once, Worth Kober, Tally Due, MD   Family History  Problem  Relation Age of Onset  . Cancer Sister      Social History   Tobacco Use  . Smoking status: Current Every Day Smoker    Types: Cigars  . Smokeless tobacco: Never Used  Substance Use Topics  . Alcohol use: Not Currently    Comment: 1 beer every day   . Drug use: No    Allergies as of 02/05/2021 - Review Complete  02/05/2021  Allergen Reaction Noted  . Shellfish allergy Swelling     Review of Systems:    All systems reviewed and negative except where noted in HPI.   Physical Exam:  Vital signs in last 24 hours: Temp:  [100 F (37.8 C)-100.5 F (38.1 C)] 100.5 F (38.1 C) (05/31 1436) Pulse Rate:  [98-103] 98 (05/31 1436) Resp:  [18] 18 (05/31 1110) BP: (126-140)/(82-86) 137/82 (05/31 1436) SpO2:  [99 %-100 %] 100 % (05/31 1436) Weight:  [72.6 kg] 72.6 kg (05/31 0955) Last BM Date: 02/05/21 General:   Pleasant, cooperative in NAD Head:  Normocephalic and atraumatic. Eyes:   No icterus.   Conjunctiva pale. PERRLA. Ears:  Normal auditory acuity. Neck:  Supple; no masses or thyroidomegaly Lungs: Respirations even and unlabored. Lungs clear to auscultation bilaterally.   No wheezes, crackles, or rhonchi.  Heart:  Regular rate and rhythm;  Without murmur, clicks, rubs or gallops Abdomen:  Soft, mildly distended, left lower quadrant tenderness, Normal bowel sounds. No appreciable masses or hepatomegaly.  No rebound or guarding.  Rectal:  Not performed. Msk:  Symmetrical without gross deformities.  Strength generalized weakness Extremities:  Without edema, cyanosis or clubbing. Neurologic:  Alert and oriented x3;  grossly normal neurologically. Skin:  Intact without significant lesions or rashes. Psych:  Alert and cooperative. Normal affect.  LAB RESULTS: CBC Latest Ref Rng & Units 02/05/2021 02/05/2021 01/08/2020  WBC 4.0 - 10.5 K/uL 18.2(H) 16.7(H) 6.0  Hemoglobin 13.0 - 17.0 g/dL 7.3(L) 7.9(L) 14.4  Hematocrit 39.0 - 52.0 % 23.6(L) 25.1(L) 40.1  Platelets 150 - 400 K/uL 477(H) 500(H) 235    BMET BMP Latest Ref Rng & Units 02/05/2021 02/05/2021 01/08/2020  Glucose 70 - 99 mg/dL 89 109(H) 103(H)  BUN 6 - 20 mg/dL _0 Creatinine 0.61 - 1.24 mg/dL 1.14 1.18 1.08  Sodium 135 - 145 mmol/L 134(L) 132(L) 138  Potassium 3.5 - 5.1 mmol/L 4.1 4.0 3.8  Chloride 98 - 111 mmol/L 99 98 104  CO2 22  - 32 mmol/L _1 Calcium 8.9 - 10.3 mg/dL 8.5(L) 8.5(L) 8.8(L)    LFT Hepatic Function Latest Ref Rng & Units 02/05/2021 02/05/2021 01/08/2020  Total Protein 6.5 - 8.1 g/dL 8.3(H) 8.4(H) 7.9  Albumin 3.5 - 5.0 g/dL 3.2(L) 3.0(L) 3.9  AST 15 - 41 U/L 14(L) 16 13(L)  ALT 0 - 44 U/L _2 Alk Phosphatase 38 - 126 U/L 79 78 83  Total Bilirubin 0.3 - 1.2 mg/dL 0.5 0.5 0.6  Bilirubin, Direct - - - -     STUDIES: CT ABDOMEN PELVIS WO CONTRAST  Result Date: 02/05/2021 CLINICAL DATA:  Elevated white blood count. EXAM: CT ABDOMEN AND PELVIS WITHOUT CONTRAST TECHNIQUE: Multidetector CT imaging of the abdomen and pelvis was performed following the standard protocol without IV contrast. COMPARISON:  Jan 08, 2020. FINDINGS: Lower chest: No acute abnormality. Hepatobiliary: No gallstones or biliary dilatation is noted. Grossly stable 1 cm low density is noted along inferior portion of right hepatic lobe. Stable probable small cyst is seen  posteriorly in the right hepatic lobe. Enhancing abnormality seen on prior exam in left hepatic lobe is not visualized currently due to the lack of intravenous contrast administration. Pancreas: Unremarkable. No pancreatic ductal dilatation or surrounding inflammatory changes. Spleen: Normal in size without focal abnormality. Adrenals/Urinary Tract: Adrenal glands are unremarkable. Kidneys are normal, without renal calculi, focal lesion, or hydronephrosis. Bladder is unremarkable. Stomach/Bowel: The stomach appears normal. The appendix appears normal. There is no evidence of bowel obstruction. There appears to be significantly increased wall thickening involving the rectum with a significantly increased amount of inflammatory change in the perirectal soft tissues. This is concerning for rectal malignancy or possibly severe proctitis. Stable linear high density is seen adjacent to sigmoid colon concerning for ingested bone fragment. Vascular/Lymphatic: Aortic  atherosclerosis. 3.1 x 2.3 cm right internal iliac lymph node is noted which is new since prior exam and highly concerning for metastatic disease. Reproductive: Prostate is unremarkable. Other: No abdominal wall hernia or abnormality. No abdominopelvic ascites. Musculoskeletal: No acute or significant osseous findings. IMPRESSION: Severe rectal wall thickening is noted with surrounding inflammation which is significantly worse compared to prior exam. There also appears to be a 3.1 x 2.3 cm right internal iliac lymph node which is concerning for metastatic disease. These findings are concerning for rectal malignancy or severe proctitis. Sigmoidoscopy is recommended. Enhancing abnormality seen in left hepatic lobe on prior exam is not visualized currently due to lack of intravenous contrast. Electronically Signed   By: Marijo Conception M.D.   On: 02/05/2021 11:30      Impression / Plan:   Barry [name]. is a 55 y.o. pleasant African-American male with history of heavy tobacco use is seen in consultation for iron deficiency anemia, change in bowel habits, rectal pain, left lower quadrant pain, CT revealing thickening of the rectal wall with enlarged right internal iliac lymph node concerning for proctitis or malignancy  Rectal pain with thickening of the rectum and enlarged lymph node: Patient has likely reactive leukocytosis and thrombocytosis Continue empiric antibiotics for now Okay with clear liquid diet Plan for sigmoidoscopy or colonoscopy tomorrow Bowel prep ordered  Severe iron deficiency anemia Recommend parenteral iron therapy  I have discussed alternative options, risks & benefits,  which include, but are not limited to, bleeding, infection, perforation,respiratory complication & drug reaction.  The patient agrees with this plan & written consent will be obtained.     Thank you for involving me in the care of this patient.  GI will follow along with you    LOS: 0 days   Sherri Sear, MD  02/05/2021, 5:12 PM   Note: This dictation was prepared with Dragon dictation along with smaller phrase technology. Any transcriptional errors that result from this process are unintentional.

## 2021-02-06 ENCOUNTER — Observation Stay: Payer: Self-pay | Admitting: Anesthesiology

## 2021-02-06 ENCOUNTER — Encounter: Payer: Self-pay | Admitting: Internal Medicine

## 2021-02-06 ENCOUNTER — Encounter
Admission: EM | Disposition: A | Discharge status: Home / Self Care | Payer: Self-pay | Source: Home / Self Care | Attending: Emergency Medicine

## 2021-02-06 ENCOUNTER — Telehealth: Payer: Self-pay

## 2021-02-06 ENCOUNTER — Other Ambulatory Visit: Payer: Self-pay

## 2021-02-06 DIAGNOSIS — C20 Malignant neoplasm of rectum: Secondary | ICD-10-CM

## 2021-02-06 DIAGNOSIS — K635 Polyp of colon: Secondary | ICD-10-CM

## 2021-02-06 DIAGNOSIS — D509 Iron deficiency anemia, unspecified: Secondary | ICD-10-CM

## 2021-02-06 DIAGNOSIS — K6289 Other specified diseases of anus and rectum: Secondary | ICD-10-CM

## 2021-02-06 HISTORY — PX: COLONOSCOPY WITH PROPOFOL: SHX5780

## 2021-02-06 LAB — CBC
HCT: 23.5 % — ABNORMAL LOW (ref 39.0–52.0)
HCT: 23.2 % — ABNORMAL LOW (ref 39.0–52.0)
Hemoglobin: 7.6 g/dL — ABNORMAL LOW (ref 13.0–17.0)
Hemoglobin: 7.3 g/dL — ABNORMAL LOW (ref 13.0–17.0)
MCH: 20.9 pg — ABNORMAL LOW (ref 26.0–34.0)
MCH: 20.8 pg — ABNORMAL LOW (ref 26.0–34.0)
MCHC: 32.3 g/dL (ref 30.0–36.0)
MCHC: 31.5 g/dL (ref 30.0–36.0)
MCV: 64.7 fL — ABNORMAL LOW (ref 80.0–100.0)
MCV: 66.1 fL — ABNORMAL LOW (ref 80.0–100.0)
Platelets: 477 10*3/uL — ABNORMAL HIGH (ref 150–400)
Platelets: 446 10*3/uL — ABNORMAL HIGH (ref 150–400)
RBC: 3.63 MIL/uL — ABNORMAL LOW (ref 4.22–5.81)
RBC: 3.51 MIL/uL — ABNORMAL LOW (ref 4.22–5.81)
RDW: 16.8 % — ABNORMAL HIGH (ref 11.5–15.5)
RDW: 16.8 % — ABNORMAL HIGH (ref 11.5–15.5)
WBC: 18.6 10*3/uL — ABNORMAL HIGH (ref 4.0–10.5)
WBC: 17.7 10*3/uL — ABNORMAL HIGH (ref 4.0–10.5)
nRBC: 0.2 % (ref 0.0–0.2)
nRBC: 0.2 % (ref 0.0–0.2)

## 2021-02-06 LAB — PROCALCITONIN: Procalcitonin: 0.16 ng/mL

## 2021-02-06 LAB — HEMOGLOBIN AND HEMATOCRIT, BLOOD
HCT: 25.8 % — ABNORMAL LOW (ref 39.0–52.0)
Hemoglobin: 8.2 g/dL — ABNORMAL LOW (ref 13.0–17.0)

## 2021-02-06 LAB — PREPARE RBC (CROSSMATCH)

## 2021-02-06 SURGERY — COLONOSCOPY WITH PROPOFOL
Anesthesia: General

## 2021-02-06 MED ORDER — PROPOFOL 10 MG/ML IV BOLUS
INTRAVENOUS | Status: DC | PRN
  Administered 2021-02-06: 50 mg via INTRAVENOUS
  Administered 2021-02-06: 20 mg via INTRAVENOUS
  Administered 2021-02-06: 30 mg via INTRAVENOUS

## 2021-02-06 MED ORDER — ENSURE ENLIVE PO LIQD
237.0000 mL | ORAL | Status: DC
  Administered 2021-02-06: 237 mL via ORAL

## 2021-02-06 MED ORDER — SODIUM CHLORIDE 0.9% IV SOLUTION: Freq: Once | INTRAVENOUS | Status: DC

## 2021-02-06 MED ORDER — ENSURE ENLIVE PO LIQD: 237.0000 mL | ORAL | 12 refills | Status: DC

## 2021-02-06 MED ORDER — FERROUS SULFATE 325 (65 FE) MG PO TABS: 325.0000 mg | ORAL_TABLET | Freq: Two times a day (BID) | ORAL | 3 refills | Status: DC

## 2021-02-06 MED ORDER — NICOTINE 21 MG/24HR TD PT24
21.0000 mg | MEDICATED_PATCH | Freq: Every day | TRANSDERMAL | 0 refills | Status: DC
Start: 2021-02-07 — End: 2022-03-05

## 2021-02-06 MED ORDER — LACTATED RINGERS IV SOLN: INTRAVENOUS | Status: DC

## 2021-02-06 MED ORDER — PROPOFOL 500 MG/50ML IV EMUL
INTRAVENOUS | Status: DC | PRN
  Administered 2021-02-06: 170 ug/kg/min via INTRAVENOUS

## 2021-02-06 MED ORDER — LIDOCAINE HCL (CARDIAC) PF 100 MG/5ML IV SOSY
PREFILLED_SYRINGE | INTRAVENOUS | Status: DC | PRN
  Administered 2021-02-06: 40 mg via INTRAVENOUS

## 2021-02-06 MED ORDER — FERROUS SULFATE 325 (65 FE) MG PO TABS
325.0000 mg | ORAL_TABLET | Freq: Two times a day (BID) | ORAL | Status: DC
  Administered 2021-02-06: 325 mg via ORAL
  Filled 2021-02-06: qty 1

## 2021-02-06 NOTE — Progress Notes (Signed)
Order received from Dr Marius Ditch for a regular diet

## 2021-02-06 NOTE — Discharge Summary (Signed)
Physician Discharge Summary  Barry [name]. TIR:[id number] DOB: 1965-11-19 DOA: 02/05/2021  PCP: Patient, No Pcp Per (Inactive)  Admit date: 02/05/2021 Discharge date: 02/06/2021  Admitted From: Home Disposition: Home  Recommendations for Outpatient Follow-up:  1. Follow up with PCP in 1-2 weeks 2. Follow-up with oncology on Friday 3. Follow-up with GI in 1 week 4. Please obtain BMP/CBC in one week 5. Please follow up on the following pending results: Colon biopsy pathology  Home Health: No Equipment/Devices: None Discharge Condition: Stable CODE STATUS: Full Diet recommendation:  Regular   Brief/Interim Summary: 55 year old gentleman with no significant past medical history presented to ED with complaints of generalized weakness, intermittent lightheadedness, weight loss and worsening constipation.  Of note, patient has had a12 mm density of the sigmoid colon concerning for ingested "bone fragment" first identified in 2016 that was never addressed in the outpatient setting.  This questionable bone fragment was identified once again in 01/2020 but again it was never addressed in the outpatient setting.  Patient reports never having had a colonoscopy.  Patient was also having some rectal pain and intermittent hematochezia.  CT abdomen with concerning for severe rectal wall thickening and right internal iliac lymph node concerning for metastatic disease.  GI was consulted and patient was taken for colonoscopy which shows a circumferential fungating mass with concern of malignancy.  Biopsies were taken.  Oncology appointment was made with Dr. Tasia Catchings for this Friday for further follow-up.  Patient was also found to be anemic, anemia panel with iron deficiency anemia.  Patient received 1 unit of PRBC and 1 dose of IV iron.  He was also started on iron supplement and will follow up with hematology/oncology for further recommendations.  Patient will follow up with GI and oncology for pathology  results and further recommendations for management of his most likely malignant disease.  Discharge Diagnoses:  Principal Problem:   Constipation by outlet obstruction Active Problems:   Nicotine dependence with withdrawal   Chronic blood loss anemia   Postural dizziness with presyncope   Bowel wall thickening   Hyponatremia   Proctitis   Discharge Instructions  Discharge Instructions    Diet - low sodium heart healthy   Complete by: As directed    Discharge instructions   Complete by: As directed    It was pleasure taking care of you. Please keep your self well-hydrated and regularly use MiraLAX and senna to avoid constipation. You are also been started on iron tablet, please take it with meals, they can also worsen constipation. Please follow-up with Dr. Tasia Catchings from oncology on Friday at 9 AM for further recommendations.   Increase activity slowly   Complete by: As directed      Allergies as of 02/06/2021      Reactions   Shellfish Allergy Swelling      Medication List    STOP taking these medications   amoxicillin 875 MG tablet Commonly known as: AMOXIL   chlorhexidine 0.12 % solution Commonly known as: PERIDEX   ibuprofen 800 MG tablet Commonly known as: ADVIL   lidocaine 2 % solution Commonly known as: XYLOCAINE   oxyCODONE-acetaminophen 5-325 MG tablet Commonly known as: Percocet   penicillin v potassium 500 MG tablet Commonly known as: VEETID   sulfamethoxazole-trimethoprim 800-160 MG tablet Commonly known as: BACTRIM DS     TAKE these medications   acetaminophen 325 MG tablet Commonly known as: TYLENOL Take 650 mg by mouth every 6 (six) hours as needed.   alum &  mag hydroxide-simeth 200-200-20 MG/5ML suspension Commonly known as: MAALOX/MYLANTA Take 15 mLs by mouth daily.   feeding supplement Liqd Take 237 mLs by mouth daily. Start taking on: February 07, 2021   ferrous sulfate 325 (65 FE) MG tablet Take 1 tablet (325 mg total) by mouth 2 (two)  times daily with a meal.   multivitamin with minerals Tabs tablet Take 1 tablet by mouth daily.   nicotine 21 mg/24hr patch Commonly known as: NICODERM CQ - dosed in mg/24 hours Place 1 patch (21 mg total) onto the skin daily. Start taking on: February 07, 2021   polyethylene glycol powder 17 GM/SCOOP powder Commonly known as: GLYCOLAX/MIRALAX 1 cap full in a full glass of water, once a day as needed for constipation   senna-docusate 8.6-50 MG tablet Commonly known as: Senokot-S Take 2 tablets by mouth 2 (two) times daily.       Follow-up Information    Earlie Server, MD. Schedule an appointment as soon as possible for a visit on 02/08/2021.   Specialty: Oncology Why: Please see Dr.Yu at 9 AM Contact information: [place] [address] [phone]        Ottie Glazier, MD. Schedule an appointment as soon as possible for a visit in 1 week(s).   Specialty: Pulmonary Disease Contact information: [place] [address] [phone]        Lin Landsman, MD. Schedule an appointment as soon as possible for a visit in 1 week(s).   Specialty: Gastroenterology Contact information: [place] [address] [phone]              Allergies  Allergen Reactions  . Shellfish Allergy Swelling    Consultations:  Gastroenterology  Procedures/Studies: CT ABDOMEN PELVIS WO CONTRAST  Result Date: 02/05/2021 CLINICAL DATA:  Elevated white blood count. EXAM: CT ABDOMEN AND PELVIS WITHOUT CONTRAST TECHNIQUE: Multidetector CT imaging of the abdomen and pelvis was performed following the standard protocol without IV contrast. COMPARISON:  Jan 08, 2020. FINDINGS: Lower chest: No acute abnormality. Hepatobiliary: No gallstones or biliary dilatation is noted. Grossly stable 1 cm low density is noted along inferior portion of right hepatic lobe. Stable probable small cyst is seen posteriorly in the right hepatic lobe.  Enhancing abnormality seen on prior exam in left hepatic lobe is not visualized currently due to the lack of intravenous contrast administration. Pancreas: Unremarkable. No pancreatic ductal dilatation or surrounding inflammatory changes. Spleen: Normal in size without focal abnormality. Adrenals/Urinary Tract: Adrenal glands are unremarkable. Kidneys are normal, without renal calculi, focal lesion, or hydronephrosis. Bladder is unremarkable. Stomach/Bowel: The stomach appears normal. The appendix appears normal. There is no evidence of bowel obstruction. There appears to be significantly increased wall thickening involving the rectum with a significantly increased amount of inflammatory change in the perirectal soft tissues. This is concerning for rectal malignancy or possibly severe proctitis. Stable linear high density is seen adjacent to sigmoid colon concerning for ingested bone fragment. Vascular/Lymphatic: Aortic atherosclerosis. 3.1 x 2.3 cm right internal iliac lymph node is noted which is new since prior exam and highly concerning for metastatic disease. Reproductive: Prostate is unremarkable. Other: No abdominal wall hernia or abnormality. No abdominopelvic ascites. Musculoskeletal: No acute or significant osseous findings. IMPRESSION: Severe rectal wall thickening is noted with surrounding inflammation which is significantly worse compared to prior exam. There also appears to be a 3.1 x 2.3 cm right internal iliac lymph node which is concerning for metastatic disease. These  findings are concerning for rectal malignancy or severe proctitis. Sigmoidoscopy is recommended. Enhancing abnormality seen in left hepatic lobe on prior exam is not visualized currently due to lack of intravenous contrast. Electronically Signed   By: Marijo Conception M.D.   On: 02/05/2021 11:30     Subjective: Patient was seen and examined today.  He was getting ready to go for colonoscopy.  Denies any more hematochezia.  No  abdominal pain, no nausea or vomiting.  Patient has significant weight loss over the past year.  Wife and son at bedside.  Discharge Exam: Vitals:   02/06/21 1443 02/06/21 1515  BP: 121/76 116/69  Pulse: 92 100  Resp: 16 16  Temp: 98.7 F (37.1 C) 98.8 F (37.1 C)  SpO2: 100% 100%   Vitals:   02/06/21 1307 02/06/21 1343 02/06/21 1443 02/06/21 1515  BP: 124/81 126/83 121/76 116/69  Pulse: 85 81 92 100  Resp:  16 16 16   Temp:  99.3 F (37.4 C) 98.7 F (37.1 C) 98.8 F (37.1 C)  TempSrc:   Oral   SpO2: 100% 100% 100% 100%  Weight:      Height:        General: Pt is alert, awake, not in acute distress Cardiovascular: RRR, S1/S2 +, no rubs, no gallops Respiratory: CTA bilaterally, no wheezing, no rhonchi Abdominal: Soft, NT, ND, bowel sounds + Extremities: no edema, no cyanosis   The results of significant diagnostics from this hospitalization (including imaging, microbiology, ancillary and laboratory) are listed below for reference.    Microbiology: Recent Results (from the past 240 hour(s))  Resp Panel by RT-PCR (Flu A&B, Covid) Nasopharyngeal Swab     Status: None   Collection Time: 02/05/21 12:13 PM   Specimen: Nasopharyngeal Swab; Nasopharyngeal(NP) swabs in vial transport medium  Result Value Ref Range Status   SARS Coronavirus 2 by RT PCR NEGATIVE NEGATIVE Final    Comment: (NOTE) SARS-CoV-2 target nucleic acids are NOT DETECTED.  The SARS-CoV-2 RNA is generally detectable in upper respiratory specimens during the acute phase of infection. The lowest concentration of SARS-CoV-2 viral copies this assay can detect is 138 copies/mL. A negative result does not preclude SARS-Cov-2 infection and should not be used as the sole basis for treatment or other patient management decisions. A negative result may occur with  improper specimen collection/handling, submission of specimen other than nasopharyngeal swab, presence of viral mutation(s) within the areas targeted  by this assay, and inadequate number of viral copies(<138 copies/mL). A negative result must be combined with clinical observations, patient history, and epidemiological information. The expected result is Negative.  Fact Sheet for Patients:  EntrepreneurPulse.com.au  Fact Sheet for Healthcare Providers:  IncredibleEmployment.be  This test is no t yet approved or cleared by the [place] FDA and  has been authorized for detection and/or diagnosis of SARS-CoV-2 by FDA under an Emergency Use Authorization (EUA). This EUA will remain  in effect (meaning this test can be used) for the duration of the COVID-19 declaration under Section 564(b)(1) of the Act, 21 U.S.C.section 360bbb-3(b)(1), unless the authorization is terminated  or revoked sooner.       Influenza A by PCR NEGATIVE NEGATIVE Final   Influenza B by PCR NEGATIVE NEGATIVE Final    Comment: (NOTE) The Xpert Xpress SARS-CoV-2/FLU/RSV plus assay is intended as an aid in the diagnosis of influenza from Nasopharyngeal swab specimens and should not be used as a sole basis for treatment. Nasal washings and aspirates are unacceptable for Xpert  Xpress SARS-CoV-2/FLU/RSV testing.  Fact Sheet for Patients: EntrepreneurPulse.com.au  Fact Sheet for Healthcare Providers: IncredibleEmployment.be  This test is not yet approved or cleared by the [place] FDA and has been authorized for detection and/or diagnosis of SARS-CoV-2 by FDA under an Emergency Use Authorization (EUA). This EUA will remain in effect (meaning this test can be used) for the duration of the COVID-19 declaration under Section 564(b)(1) of the Act, 21 U.S.C. section 360bbb-3(b)(1), unless the authorization is terminated or revoked.  Performed at [facility], [address] [place], [place] [address]   Blood culture (routine x 2)     Status: None (Preliminary result)    Collection Time: 02/05/21  1:09 PM   Specimen: BLOOD  Result Value Ref Range Status   Specimen Description BLOOD RIGHT ANTECUBITAL  Final   Special Requests   Final    BOTTLES DRAWN AEROBIC AND ANAEROBIC Blood Culture adequate volume   Culture   Final    NO GROWTH < 24 HOURS Performed at [facility], [address] [place], [place] [address]    Report Status PENDING  Incomplete  Blood culture (routine x 2)     Status: None (Preliminary result)   Collection Time: 02/05/21  2:11 PM   Specimen: BLOOD  Result Value Ref Range Status   Specimen Description BLOOD BLOOD RIGHT HAND  Final   Special Requests   Final    BOTTLES DRAWN AEROBIC AND ANAEROBIC Blood Culture results may not be optimal due to an inadequate volume of blood received in culture bottles   Culture   Final    NO GROWTH < 24 HOURS Performed at [facility], [address] [place], [place] [address]    Report Status PENDING  Incomplete     Labs: BNP (last 3 results) No results for input(s): BNP in the last 8760 hours. Basic Metabolic Panel: Recent Labs  Lab 02/05/21 1028 02/05/21 1309  NA 132* 134*  K 4.0 4.1  CL 98 99  CO2 25 24  GLUCOSE 109* 89  BUN 12 11  CREATININE 1.18 1.14  CALCIUM 8.5* 8.5*  MG  --  2.2   Liver Function Tests: Recent Labs  Lab 02/05/21 1028 02/05/21 1309  AST 16 14*  ALT 11 13  ALKPHOS 78 79  BILITOT 0.5 0.5  PROT 8.4* 8.3*  ALBUMIN 3.0* 3.2*   Recent Labs  Lab 02/05/21 1028  LIPASE 27   No results for input(s): AMMONIA in the last 168 hours. CBC: Recent Labs  Lab 02/05/21 1028 02/05/21 1606 02/06/21 0026 02/06/21 0731  WBC 16.7* 18.2* 18.6* 17.7*  HGB 7.9* 7.3* 7.6* 7.3*  HCT 25.1* 23.6* 23.5* 23.2*  MCV 66.6* 66.7* 64.7* 66.1*  PLT 500* 477* 477* 446*   Cardiac Enzymes: No results for input(s): CKTOTAL, CKMB, CKMBINDEX, TROPONINI in the last 168 hours. BNP: Invalid input(s): POCBNP CBG: No results for input(s): GLUCAP in the last  168 hours. D-Dimer No results for input(s): DDIMER in the last 72 hours. Hgb A1c No results for input(s): HGBA1C in the last 72 hours. Lipid Profile No results for input(s): CHOL, HDL, LDLCALC, TRIG, CHOLHDL, LDLDIRECT in the last 72 hours. Thyroid function studies No results for input(s): TSH, T4TOTAL, T3FREE, THYROIDAB in the last 72 hours.  Invalid input(s): FREET3 Anemia work up Recent Labs    02/05/21 1309  VITAMINB12 222  FOLATE 15.2  FERRITIN 22*  TIBC 378  IRON 9*  RETICCTPCT 1.3   Urinalysis  Component Value Date/Time   COLORURINE YELLOW (A) 02/05/2021 1028   APPEARANCEUR CLEAR (A) 02/05/2021 1028   LABSPEC 1.019 02/05/2021 1028   PHURINE 5.0 02/05/2021 1028   GLUCOSEU NEGATIVE 02/05/2021 1028   HGBUR NEGATIVE 02/05/2021 1028   BILIRUBINUR NEGATIVE 02/05/2021 1028   KETONESUR NEGATIVE 02/05/2021 1028   PROTEINUR NEGATIVE 02/05/2021 1028   UROBILINOGEN 0.2 01/15/2015 0808   NITRITE NEGATIVE 02/05/2021 1028   LEUKOCYTESUR NEGATIVE 02/05/2021 1028   Sepsis Labs Invalid input(s): PROCALCITONIN,  WBC,  LACTICIDVEN Microbiology Recent Results (from the past 240 hour(s))  Resp Panel by RT-PCR (Flu A&B, Covid) Nasopharyngeal Swab     Status: None   Collection Time: 02/05/21 12:13 PM   Specimen: Nasopharyngeal Swab; Nasopharyngeal(NP) swabs in vial transport medium  Result Value Ref Range Status   SARS Coronavirus 2 by RT PCR NEGATIVE NEGATIVE Final    Comment: (NOTE) SARS-CoV-2 target nucleic acids are NOT DETECTED.  The SARS-CoV-2 RNA is generally detectable in upper respiratory specimens during the acute phase of infection. The lowest concentration of SARS-CoV-2 viral copies this assay can detect is 138 copies/mL. A negative result does not preclude SARS-Cov-2 infection and should not be used as the sole basis for treatment or other patient management decisions. A negative result may occur with  improper specimen collection/handling, submission of specimen  other than nasopharyngeal swab, presence of viral mutation(s) within the areas targeted by this assay, and inadequate number of viral copies(<138 copies/mL). A negative result must be combined with clinical observations, patient history, and epidemiological information. The expected result is Negative.  Fact Sheet for Patients:  EntrepreneurPulse.com.au  Fact Sheet for Healthcare Providers:  IncredibleEmployment.be  This test is no t yet approved or cleared by the [place] FDA and  has been authorized for detection and/or diagnosis of SARS-CoV-2 by FDA under an Emergency Use Authorization (EUA). This EUA will remain  in effect (meaning this test can be used) for the duration of the COVID-19 declaration under Section 564(b)(1) of the Act, 21 U.S.C.section 360bbb-3(b)(1), unless the authorization is terminated  or revoked sooner.       Influenza A by PCR NEGATIVE NEGATIVE Final   Influenza B by PCR NEGATIVE NEGATIVE Final    Comment: (NOTE) The Xpert Xpress SARS-CoV-2/FLU/RSV plus assay is intended as an aid in the diagnosis of influenza from Nasopharyngeal swab specimens and should not be used as a sole basis for treatment. Nasal washings and aspirates are unacceptable for Xpert Xpress SARS-CoV-2/FLU/RSV testing.  Fact Sheet for Patients: EntrepreneurPulse.com.au  Fact Sheet for Healthcare Providers: IncredibleEmployment.be  This test is not yet approved or cleared by the [place] FDA and has been authorized for detection and/or diagnosis of SARS-CoV-2 by FDA under an Emergency Use Authorization (EUA). This EUA will remain in effect (meaning this test can be used) for the duration of the COVID-19 declaration under Section 564(b)(1) of the Act, 21 U.S.C. section 360bbb-3(b)(1), unless the authorization is terminated or revoked.  Performed at [facility], [address]  [place], [place] [address]   Blood culture (routine x 2)     Status: None (Preliminary result)   Collection Time: 02/05/21  1:09 PM   Specimen: BLOOD  Result Value Ref Range Status   Specimen Description BLOOD RIGHT ANTECUBITAL  Final   Special Requests   Final    BOTTLES DRAWN AEROBIC AND ANAEROBIC Blood Culture adequate volume   Culture   Final    NO GROWTH < 24 HOURS Performed at [facility],  [place], [place] [address]    Report Status PENDING  Incomplete  Blood culture (routine x 2)     Status: None (Preliminary result)   Collection Time: 02/05/21  2:11 PM   Specimen: BLOOD  Result Value Ref Range Status   Specimen Description BLOOD BLOOD RIGHT HAND  Final   Special Requests   Final    BOTTLES DRAWN AEROBIC AND ANAEROBIC Blood Culture results may not be optimal due to an inadequate volume of blood received in culture bottles   Culture   Final    NO GROWTH < 24 HOURS Performed at [facility], [address], [place], [place] [address]    Report Status PENDING  Incomplete    Time coordinating discharge: Over 30 minutes  SIGNED:  Lorella Nimrod, MD  [facility] 02/06/2021, 4:45 PM  If 7PM-7AM, please contact night-coverage [email or web address]  This record has been created using Systems analyst. Errors have been sought and corrected,but may not always be located. Such creation errors do not reflect on the standard of care.

## 2021-02-06 NOTE — Anesthesia Postprocedure Evaluation (Signed)
Anesthesia Post Note  Patient: Barry [name].  Procedure(s) Performed: COLONOSCOPY WITH PROPOFOL (N/A )  Patient location during evaluation: Endoscopy Anesthesia Type: General Level of consciousness: awake and alert Pain management: pain level controlled Vital Signs Assessment: post-procedure vital signs reviewed and stable Respiratory status: spontaneous breathing and respiratory function stable Cardiovascular status: stable Anesthetic complications: no   No complications documented.   Last Vitals:  Vitals:   02/06/21 1257 02/06/21 1307  BP: 119/81 124/81  Pulse:  85  Resp:    Temp:    SpO2:  100%    Last Pain:  Vitals:   02/06/21 1307  TempSrc:   PainSc: 0-No pain                 Jaime Dome K

## 2021-02-06 NOTE — Telephone Encounter (Signed)
Placed referral per request

## 2021-02-06 NOTE — Progress Notes (Signed)
PT Screen Note  Patient Details Name: Barry [name]. MRN: [id number] DOB: 11-Oct-1965   Cancelled Treatment:    Reason Eval/Treat Not Completed: Pain limiting ability to participate;PT screened, no needs identified, will sign off  Pt able to do all aspects of mobility, confident and community appropriate gait, managed steps without UEs or issue.  No PT needs, will sign off.    Kreg Shropshire, DPT 02/06/2021, 11:05 AM

## 2021-02-06 NOTE — Progress Notes (Signed)
Discharge instructions reviewed with the patient. IV removed. Patient sent out via wheelchair with belongings 

## 2021-02-06 NOTE — Telephone Encounter (Signed)
-----   Message from Lin Landsman, MD sent at 02/06/2021  1:46 PM EDT ----- Regarding: Oncology referral Please place urgent referral Dx: rectal cancer and IDA  Thanks RV

## 2021-02-06 NOTE — Anesthesia Preprocedure Evaluation (Signed)
Anesthesia Evaluation  Patient identified by MRN, date of birth, ID band Patient awake    Reviewed: Allergy & Precautions, NPO status , Patient's Chart, lab work & pertinent test results  History of Anesthesia Complications Negative for: history of anesthetic complications  Airway Mallampati: II       Dental   Pulmonary neg sleep apnea, neg COPD, Current Smoker,           Cardiovascular (-) hypertension(-) Past MI and (-) CHF (-) dysrhythmias (-) Valvular Problems/Murmurs     Neuro/Psych neg Seizures    GI/Hepatic Neg liver ROS, neg GERD  ,  Endo/Other  neg diabetes  Renal/GU negative Renal ROS     Musculoskeletal   Abdominal   Peds  Hematology  (+) anemia ,   Anesthesia Other Findings   Reproductive/Obstetrics                             Anesthesia Physical Anesthesia Plan  ASA: II  Anesthesia Plan: General   Post-op Pain Management:    Induction: Intravenous  PONV Risk Score and Plan: 1 and Propofol infusion and TIVA  Airway Management Planned: Nasal Cannula  Additional Equipment:   Intra-op Plan:   Post-operative Plan:   Informed Consent: I have reviewed the patients History and Physical, chart, labs and discussed the procedure including the risks, benefits and alternatives for the proposed anesthesia with the patient or authorized representative who has indicated his/her understanding and acceptance.       Plan Discussed with:   Anesthesia Plan Comments:         Anesthesia Quick Evaluation

## 2021-02-06 NOTE — Op Note (Signed)
[facility] Gastroenterology Patient Name: Barry [name] Procedure Date: 02/06/2021 11:56 AM MRN: [id number] Account #: [id number] Date of Birth: August 27, 1966 Admit Type: Inpatient Age: 55 Room: [facility] ENDO ROOM 1 Gender: Male Note Status: Finalized Procedure:             Colonoscopy Indications:           This is the patient's first colonoscopy, Abnormal CT                         of the GI tract Providers:             Lin Landsman MD, MD Referring MD:          no local MD Medicines:             General Anesthesia Complications:         No immediate complications. Estimated blood loss:                         Minimal. Procedure:             Pre-Anesthesia Assessment:                        - Prior to the procedure, a History and Physical was                         performed, and patient medications and allergies were                         reviewed. The patient is competent. The risks and                         benefits of the procedure and the sedation options and                         risks were discussed with the patient. All questions                         were answered and informed consent was obtained.                         Patient identification and proposed procedure were                         verified by the physician, the nurse, the                         anesthesiologist, the anesthetist and the technician                         in the pre-procedure area in the procedure room in the                         endoscopy suite. Mental Status Examination: alert and                         oriented. Airway Examination: normal oropharyngeal  airway and neck mobility. Respiratory Examination:                         clear to auscultation. CV Examination: normal.                         Prophylactic Antibiotics: The patient does not require                         prophylactic antibiotics. Prior Anticoagulants: The                          patient has taken no previous anticoagulant or                         antiplatelet agents. ASA Grade Assessment: II - A                         patient with mild systemic disease. After reviewing                         the risks and benefits, the patient was deemed in                         satisfactory condition to undergo the procedure. The                         anesthesia plan was to use general anesthesia.                         Immediately prior to administration of medications,                         the patient was re-assessed for adequacy to receive                         sedatives. The heart rate, respiratory rate, oxygen                         saturations, blood pressure, adequacy of pulmonary                         ventilation, and response to care were monitored                         throughout the procedure. The physical status of the                         patient was re-assessed after the procedure.                        After obtaining informed consent, the colonoscope was                         passed under direct vision. Throughout the procedure,                         the patient's blood pressure, pulse, and oxygen  saturations were monitored continuously. The was                         introduced through the anus and advanced to the the                         cecum, identified by appendiceal orifice and ileocecal                         valve. The colonoscopy was performed with moderate                         difficulty due to inadequate bowel prep and                         significant looping. Successful completion of the                         procedure was aided by applying abdominal pressure.                         The patient tolerated the procedure well. The quality                         of the bowel preparation was poor. Findings:      The digital rectal exam revealed a firm rectal mass palpated 0.5 cm  from       the anal verge. The mass was circumferential.      Three sessile polyps were found in the sigmoid colon and descending       colon. The polyps were 3 to 5 mm in size. These polyps were removed with       a cold snare. Resection and retrieval were complete.      A fungating, infiltrative, submucosal and ulcerated non-obstructing       large mass was found in the rectum. The mass was circumferential. The       mass measured ten cm in length. Oozing was present. Biopsies were taken       with a cold forceps for histology. Estimated blood loss was minimal. Impression:            - Preparation of the colon was poor.                        - Rectal mass 0.5 cm from the anal verge.                        - Three 3 to 5 mm polyps in the sigmoid colon and in                         the descending colon, removed with a cold snare.                         Resected and retrieved.                        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 Recommendation:        - Return patient to hospital ward for ongoing care.                        -  Soft diet.                        - Refer to an oncologist at appointment to be                         scheduled.                        - Await pathology results. Procedure Code(s):     --- Professional ---                        [phone], Colonoscopy, flexible; with removal of                         tumor(s), polyp(s), or other lesion(s) by snare                         technique                        45380, 93, Colonoscopy, flexible; with biopsy, single                         or multiple Diagnosis Code(s):     --- Professional ---                        K62.89, Other specified diseases of anus and rectum                        K63.5, Polyp of colon                        C20, Malignant neoplasm of rectum                        R93.3, Abnormal findings on diagnostic imaging of                         other parts of digestive tract CPT copyright  2019 American Medical Association. All rights reserved. The codes documented in this report are preliminary and upon coder review may  be revised to meet current compliance requirements. Dr. Ulyess Mort Lin Landsman MD, MD 02/06/2021 12:34:13 PM This report has been signed electronically. Number of Addenda: 0 Note Initiated On: 02/06/2021 11:56 AM Scope Withdrawal Time: 0 hours 14 minutes 57 seconds  Total Procedure Duration: 0 hours 22 minutes 27 seconds  Estimated Blood Loss:  Estimated blood loss was minimal.      [facility]

## 2021-02-06 NOTE — Transfer of Care (Signed)
Immediate Anesthesia Transfer of Care Note  Patient: Barry [name].  Procedure(s) Performed: COLONOSCOPY WITH PROPOFOL (N/A )  Patient Location: PACU  Anesthesia Type:General  Level of Consciousness: awake and alert   Airway & Oxygen Therapy: Patient Spontanous Breathing  Post-op Assessment: Report given to RN and Post -op Vital signs reviewed and stable  Post vital signs: Reviewed and stable  Last Vitals:  Vitals Value Taken Time  BP 90/56   Temp    Pulse    Resp 18 02/06/21 1237  SpO2    Vitals shown include unvalidated device data.  Last Pain:  Vitals:   02/06/21 1118  TempSrc: Oral  PainSc:          Complications: No complications documented.

## 2021-02-06 NOTE — Progress Notes (Signed)
Initial Nutrition Assessment  DOCUMENTATION CODES:  Not applicable  INTERVENTION:   Regular diet, encourage PO intake  Monitor intake trends  Ensure Enlive po 1x/d, each supplement provides 350 kcal and 20 grams of protein  NUTRITION DIAGNOSIS:  Inadequate oral intake related to constipation,poor appetite as evidenced by per patient/family report.  GOAL:  Patient will meet greater than or equal to 90% of their needs  MONITOR:  PO intake,Labs,Supplement acceptance  REASON FOR ASSESSMENT:  Malnutrition Screening Tool    ASSESSMENT:  Pt presented to ED with ongoing abdominal pain, rectal pressure, and constipation for the last 6 months. Reports he rarely has solid stools, either liquid or string like. Pt reports poor appetite due to bloated feeling in abdomen and estimates a 20-30 lb weight loss. Imaging in ED concerning for malignancy. PMH relevant for tobacco abuse.   Of note, CT in ED showed severe rectal wall thickening with a 3.1 x 2.3 cm right internal iliac lymph node concerning for metastatic disease.  Rectal wall thickening is concerning for a rectal malignancy versus proctitis. GI consulted and pt underwent colonoscopy 6/1  Pt out of room for procedure at the time of assessment. Will defer nutrition interview and follow-up to follow-up assessment. Review weight hx, limited recent readings available but 9% weight loss noted in the last year which is not severe. Will monitor intake trends and suggest a nutrition supplement to encourage PO intake.  6/1 - Colonoscopy (in progress)  Average Meal Intake: . 5/31-6/1: 100% intake x 1 recorded meal  Nutritionally Relevant Medications: Scheduled Meds: . ferrous sulfate  325 mg Oral BID WC   Continuous Infusions: . piperacillin-tazobactam (ZOSYN)  IV 3.375 g (02/06/21 0458)   PRN Meds: ondansetron  Labs reviewed  NUTRITION - FOCUSED PHYSICAL EXAM: Pt in procedure at the time of assessment.  Diet Order:   Diet Order             Diet regular Room service appropriate? Yes; Fluid consistency: Thin  Diet effective now                 EDUCATION NEEDS:  No education needs have been identified at this time  Skin:  Skin Assessment: Reviewed RN Assessment  Last BM:  6/1 per RN documentation  Height:  Ht Readings from Last 1 Encounters:  02/05/21 5\' 8"  (1.727 m)    Weight:  Wt Readings from Last 1 Encounters:  02/05/21 72.6 kg    Ideal Body Weight:  70 kg  BMI:  Body mass index is 24.34 kg/m.  Estimated Nutritional Needs:   Kcal:  2000-2200 kcal/d  Protein:  100-120 kcal/d  Fluid:  >2L/d   Ranell Patrick, RD, LDN Clinical Dietitian Pager on [place]

## 2021-02-06 NOTE — Progress Notes (Signed)
LR had expired. Patient is NPO. Order received from Dr Reesa Chew to continue LR at current rate of 185ml/hr

## 2021-02-07 ENCOUNTER — Encounter: Payer: Self-pay | Admitting: Gastroenterology

## 2021-02-07 LAB — BPAM RBC
Blood Product Expiration Date: 202207052359
ISSUE DATE / TIME: 202206011449
Unit Type and Rh: 5100

## 2021-02-07 LAB — TYPE AND SCREEN
ABO/RH(D): O POS
Antibody Screen: NEGATIVE
Unit division: 0

## 2021-02-07 LAB — SURGICAL PATHOLOGY

## 2021-02-08 ENCOUNTER — Encounter: Payer: Self-pay | Admitting: Oncology

## 2021-02-08 ENCOUNTER — Telehealth: Payer: Self-pay | Admitting: Oncology

## 2021-02-08 ENCOUNTER — Inpatient Hospital Stay: Payer: Self-pay

## 2021-02-08 ENCOUNTER — Inpatient Hospital Stay: Payer: Self-pay | Attending: Oncology | Admitting: Oncology

## 2021-02-08 VITALS — BP 104/67 | HR 113 | Temp 98.4°F | Resp 18 | Ht 68.0 in | Wt 149.8 lb

## 2021-02-08 DIAGNOSIS — K5909 Other constipation: Secondary | ICD-10-CM | POA: Diagnosis not present

## 2021-02-08 DIAGNOSIS — R911 Solitary pulmonary nodule: Secondary | ICD-10-CM | POA: Insufficient documentation

## 2021-02-08 DIAGNOSIS — R198 Other specified symptoms and signs involving the digestive system and abdomen: Secondary | ICD-10-CM | POA: Insufficient documentation

## 2021-02-08 DIAGNOSIS — R5383 Other fatigue: Secondary | ICD-10-CM | POA: Insufficient documentation

## 2021-02-08 DIAGNOSIS — K631 Perforation of intestine (nontraumatic): Secondary | ICD-10-CM | POA: Insufficient documentation

## 2021-02-08 DIAGNOSIS — K610 Anal abscess: Secondary | ICD-10-CM | POA: Insufficient documentation

## 2021-02-08 DIAGNOSIS — D72829 Elevated white blood cell count, unspecified: Secondary | ICD-10-CM

## 2021-02-08 DIAGNOSIS — G893 Neoplasm related pain (acute) (chronic): Secondary | ICD-10-CM | POA: Insufficient documentation

## 2021-02-08 DIAGNOSIS — D75839 Thrombocytosis, unspecified: Secondary | ICD-10-CM | POA: Diagnosis not present

## 2021-02-08 DIAGNOSIS — C20 Malignant neoplasm of rectum: Secondary | ICD-10-CM | POA: Diagnosis present

## 2021-02-08 DIAGNOSIS — Z79891 Long term (current) use of opiate analgesic: Secondary | ICD-10-CM | POA: Insufficient documentation

## 2021-02-08 DIAGNOSIS — D500 Iron deficiency anemia secondary to blood loss (chronic): Secondary | ICD-10-CM | POA: Diagnosis not present

## 2021-02-08 DIAGNOSIS — Z87891 Personal history of nicotine dependence: Secondary | ICD-10-CM | POA: Insufficient documentation

## 2021-02-08 DIAGNOSIS — Z7189 Other specified counseling: Secondary | ICD-10-CM

## 2021-02-08 LAB — CBC WITH DIFFERENTIAL/PLATELET
Abs Immature Granulocytes: 0.14 10*3/uL — ABNORMAL HIGH (ref 0.00–0.07)
Basophils Absolute: 0.1 10*3/uL (ref 0.0–0.1)
Basophils Relative: 0 %
Eosinophils Absolute: 0.2 10*3/uL (ref 0.0–0.5)
Eosinophils Relative: 1 %
HCT: 26.2 % — ABNORMAL LOW (ref 39.0–52.0)
Hemoglobin: 8.4 g/dL — ABNORMAL LOW (ref 13.0–17.0)
Immature Granulocytes: 1 %
Lymphocytes Relative: 8 %
Lymphs Abs: 2 10*3/uL (ref 0.7–4.0)
MCH: 21.8 pg — ABNORMAL LOW (ref 26.0–34.0)
MCHC: 32.1 g/dL (ref 30.0–36.0)
MCV: 67.9 fL — ABNORMAL LOW (ref 80.0–100.0)
Monocytes Absolute: 2.4 10*3/uL — ABNORMAL HIGH (ref 0.1–1.0)
Monocytes Relative: 10 %
Neutro Abs: 19.5 10*3/uL — ABNORMAL HIGH (ref 1.7–7.7)
Neutrophils Relative %: 80 %
Platelets: 501 10*3/uL — ABNORMAL HIGH (ref 150–400)
RBC: 3.86 MIL/uL — ABNORMAL LOW (ref 4.22–5.81)
RDW: 19.3 % — ABNORMAL HIGH (ref 11.5–15.5)
Smear Review: INCREASED
WBC: 24.2 10*3/uL — ABNORMAL HIGH (ref 4.0–10.5)
nRBC: 0 % (ref 0.0–0.2)

## 2021-02-08 LAB — RETIC PANEL
Immature Retic Fract: 40.1 % — ABNORMAL HIGH (ref 2.3–15.9)
RBC.: 3.88 MIL/uL — ABNORMAL LOW (ref 4.22–5.81)
Retic Count, Absolute: 28.7 10*3/uL (ref 19.0–186.0)
Retic Ct Pct: 0.7 % (ref 0.4–3.1)
Reticulocyte Hemoglobin: 26.2 pg — ABNORMAL LOW (ref >27.9)

## 2021-02-08 MED ORDER — SENNOSIDES-DOCUSATE SODIUM 8.6-50 MG PO TABS: 2.0000 | ORAL_TABLET | Freq: Every day | ORAL | 0 refills | Status: DC

## 2021-02-08 MED ORDER — HYDROCODONE-ACETAMINOPHEN 5-325 MG PO TABS: 1.0000 | ORAL_TABLET | Freq: Four times a day (QID) | ORAL | 0 refills | Status: DC | PRN

## 2021-02-08 NOTE — Progress Notes (Signed)
Patient here to establish care. Pt reports rectal pain 8/10, due to "infection".

## 2021-02-08 NOTE — Telephone Encounter (Signed)
Left message with patient to make him aware of scheduled PET and MRI. Provided day/time/location/instructions. Will send in the mail also.

## 2021-02-08 NOTE — Progress Notes (Signed)
Hematology/Oncology Consult note [name] Telephone:([phone] [phone]   Patient Care Team: Patient, No Pcp Per (Inactive) as PCP - General (General Practice) [name], RN as Oncology Nurse Navigator  REFERRING PROVIDER: Lorella Nimrod, MD  CHIEF COMPLAINTS/REASON FOR VISIT:  Evaluation of rectal cancer  HISTORY OF PRESENTING ILLNESS:   Barry [name]. is a  55 y.o.  male with PMH listed below was seen in consultation at the request of  Lorella Nimrod, MD  for evaluation of rectal cancer  02/05/2021-02/06/2021 patient was hospitalized due to generalized weakness, intermittent lightheadedness, weight loss and worsening constipation.  02/05/2021 CT abdomen showed concerning of severe rectal wall thickening and right internal iliac lymph node concerning for metastatic disease.  Patient was seen by gastroenterology and had colonoscopy which showed a circumferential fungating mass in the rectum.  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 Patient was referred to establish care with oncology today for further evaluation and management.  Patient was accompanied by wife.  He reports feeling uncomfortable, rectal pressure and discomfort,tenesmus sensation. Patient has constipation, taking MiraLAX as needed.  He rate the pain as 10 out of 10   Review of Systems  Constitutional: Positive for fatigue and unexpected weight change. Negative for appetite change, chills and fever.  HENT:   Negative for hearing loss and voice change.   Eyes: Negative for eye problems and icterus.  Respiratory: Negative for chest tightness, cough and shortness of breath.   Cardiovascular: Negative for chest pain and leg swelling.  Gastrointestinal: Positive for blood in stool, constipation and rectal pain. Negative for abdominal distention and abdominal pain.  Endocrine: Negative for hot flashes.  Genitourinary: Negative for difficulty urinating,  dysuria and frequency.   Musculoskeletal: Negative for arthralgias.  Skin: Negative for itching and rash.  Neurological: Negative for light-headedness and numbness.  Hematological: Negative for adenopathy. Does not bruise/bleed easily.  Psychiatric/Behavioral: Negative for confusion.    MEDICAL HISTORY:  Past Medical History:  Diagnosis Date  . Headache   . Left shoulder pain     SURGICAL HISTORY: Past Surgical History:  Procedure Laterality Date  . COLONOSCOPY WITH PROPOFOL N/A 02/06/2021   Procedure: COLONOSCOPY WITH PROPOFOL;  Surgeon: Lin Landsman, MD;  Location: [facility] ENDOSCOPY;  Service: Gastroenterology;  Laterality: N/A;  . ROTATOR CUFF REPAIR Left     SOCIAL HISTORY: Social History   Socioeconomic History  . Marital status: Married    Spouse name: Not on file  . Number of children: Not on file  . Years of education: Not on file  . Highest education level: Not on file  Occupational History  . Not on file  Tobacco Use  . Smoking status: Former Smoker    Packs/day: 1.00    Years: 10.00    Pack years: 10.00    Types: Cigars    Quit date: 02/04/2021    Years since quitting: 0.0  . Smokeless tobacco: Never Used  Substance and Sexual Activity  . Alcohol use: Not Currently  . Drug use: No  . Sexual activity: Not on file  Other Topics Concern  . Not on file  Social History Narrative  . Not on file   Social Determinants of Health   Financial Resource Strain: Not on file  Food Insecurity: Not on file  Transportation Needs: Not on file  Physical Activity: Not on file  Stress: Not on file  Social Connections: Not on file  Intimate Partner Violence: Not on file  FAMILY HISTORY: Family History  Problem Relation Age of Onset  . Cancer Sister   . Diabetes Mother   . Cancer Maternal Grandmother   . Cancer Paternal Grandmother     ALLERGIES:  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  MEDICATIONS:  Current Outpatient Medications  Medication Sig Dispense  Refill  . acetaminophen (TYLENOL) 325 MG tablet Take 650 mg by mouth every 6 (six) hours as needed.    . feeding supplement (ENSURE ENLIVE / ENSURE PLUS) LIQD Take 237 mLs by mouth daily. 237 mL 12  . ferrous sulfate 325 (65 FE) MG tablet Take 1 tablet (325 mg total) by mouth 2 (two) times daily with a meal. 60 tablet 3  . HYDROcodone-acetaminophen (NORCO) 5-325 MG tablet Take 1 tablet by mouth every 6 (six) hours as needed for moderate pain. 60 tablet 0  . Multiple Vitamin (MULTIVITAMIN WITH MINERALS) TABS tablet Take 1 tablet by mouth daily.    . nicotine (NICODERM CQ - DOSED IN MG/24 HOURS) 21 mg/24hr patch Place 1 patch (21 mg total) onto the skin daily. 28 patch 0  . polyethylene glycol powder (GLYCOLAX/MIRALAX) 17 GM/SCOOP powder 1 cap full in a full glass of water, once a day as needed for constipation 255 g 0  . alum & mag hydroxide-simeth (MAALOX/MYLANTA) 200-200-20 MG/5ML suspension Take 15 mLs by mouth daily. (Patient not taking: Reported on 02/08/2021)    . senna-docusate (SENOKOT-S) 8.6-50 MG tablet Take 2 tablets by mouth daily. 120 tablet 0   No current facility-administered medications for this visit.     PHYSICAL EXAMINATION: ECOG PERFORMANCE STATUS: 1 - Symptomatic but completely ambulatory Vitals:   02/08/21 0916  BP: 104/67  Pulse: (!) 113  Resp: 18  Temp: 98.4 F (36.9 C)  SpO2: 100%   Filed Weights   02/08/21 0916  Weight: 149 lb 12.8 oz (67.9 kg)    Physical Exam Constitutional:      Comments: distressed due to rectal discomfort  HENT:     Head: Normocephalic and atraumatic.  Eyes:     General: No scleral icterus. Cardiovascular:     Rate and Rhythm: Normal rate and regular rhythm.     Heart sounds: Normal heart sounds.  Pulmonary:     Effort: Pulmonary effort is normal. No respiratory distress.     Breath sounds: No wheezing.  Abdominal:     General: Bowel sounds are normal. There is no distension.     Palpations: Abdomen is soft.  Musculoskeletal:         General: No deformity. Normal range of motion.     Cervical back: Normal range of motion and neck supple.  Skin:    General: Skin is warm and dry.     Findings: No erythema or rash.  Neurological:     Mental Status: He is alert and oriented to person, place, and time. Mental status is at baseline.     Cranial Nerves: No cranial nerve deficit.     Coordination: Coordination normal.  Psychiatric:        Mood and Affect: Mood normal.     LABORATORY DATA:  I have reviewed the data as listed Lab Results  Component Value Date   WBC 24.2 (H) 02/08/2021   HGB 8.4 (L) 02/08/2021   HCT 26.2 (L) 02/08/2021   MCV 67.9 (L) 02/08/2021   PLT 501 (H) 02/08/2021   Recent Labs    02/05/21 1028 02/05/21 1309  NA 132* 134*  K 4.0 4.1  CL 98 99  CO2 25 24  GLUCOSE 109* 89  BUN 12 11  CREATININE 1.18 1.14  CALCIUM 8.5* 8.5*  GFRNONAA >60 >60  PROT 8.4* 8.3*  ALBUMIN 3.0* 3.2*  AST 16 14*  ALT 11 13  ALKPHOS 78 79  BILITOT 0.5 0.5   Iron/TIBC/Ferritin/ %Sat    Component Value Date/Time   IRON 9 (L) 02/05/2021 1309   TIBC 378 02/05/2021 1309   FERRITIN 22 (L) 02/05/2021 1309   IRONPCTSAT 2 (L) 02/05/2021 1309      RADIOGRAPHIC STUDIES: I have personally reviewed the radiological images as listed and agreed with the findings in the report. CT ABDOMEN PELVIS WO CONTRAST  Result Date: 02/05/2021 CLINICAL DATA:  Elevated white blood count. EXAM: CT ABDOMEN AND PELVIS WITHOUT CONTRAST TECHNIQUE: Multidetector CT imaging of the abdomen and pelvis was performed following the standard protocol without IV contrast. COMPARISON:  Jan 08, 2020. FINDINGS: Lower chest: No acute abnormality. Hepatobiliary: No gallstones or biliary dilatation is noted. Grossly stable 1 cm low density is noted along inferior portion of right hepatic lobe. Stable probable small cyst is seen posteriorly in the right hepatic lobe. Enhancing abnormality seen on prior exam in left hepatic lobe is not visualized  currently due to the lack of intravenous contrast administration. Pancreas: Unremarkable. No pancreatic ductal dilatation or surrounding inflammatory changes. Spleen: Normal in size without focal abnormality. Adrenals/Urinary Tract: Adrenal glands are unremarkable. Kidneys are normal, without renal calculi, focal lesion, or hydronephrosis. Bladder is unremarkable. Stomach/Bowel: The stomach appears normal. The appendix appears normal. There is no evidence of bowel obstruction. There appears to be significantly increased wall thickening involving the rectum with a significantly increased amount of inflammatory change in the perirectal soft tissues. This is concerning for rectal malignancy or possibly severe proctitis. Stable linear high density is seen adjacent to sigmoid colon concerning for ingested bone fragment. Vascular/Lymphatic: Aortic atherosclerosis. 3.1 x 2.3 cm right internal iliac lymph node is noted which is new since prior exam and highly concerning for metastatic disease. Reproductive: Prostate is unremarkable. Other: No abdominal wall hernia or abnormality. No abdominopelvic ascites. Musculoskeletal: No acute or significant osseous findings. IMPRESSION: Severe rectal wall thickening is noted with surrounding inflammation which is significantly worse compared to prior exam. There also appears to be a 3.1 x 2.3 cm right internal iliac lymph node which is concerning for metastatic disease. These findings are concerning for rectal malignancy or severe proctitis. Sigmoidoscopy is recommended. Enhancing abnormality seen in left hepatic lobe on prior exam is not visualized currently due to lack of intravenous contrast. Electronically Signed   By: Marijo Conception M.D.   On: 02/05/2021 11:30      ASSESSMENT & PLAN:  1. Rectal cancer ([place])   2. Goals of care, counseling/discussion   3. Iron deficiency anemia due to chronic blood loss   4. Leukocytosis, unspecified type   5. Thrombocytosis   6. Other  constipation   Cancer Staging Rectal cancer [facility]) Staging form: Colon and Rectum, AJCC 8th Edition - Clinical stage from 02/08/2021: Stage Unknown (cTX, cN1, cM0) - Signed by Earlie Server, MD on 02/08/2021   #Likely locally advanced rectal cancer. Recommend patient to obtain PET scan and MRI pelvis for staging. Check CBC, CMP, CEA Most likely he will need chemotherapy either in neoadjuvant setting if no distant metastasis or palliatively if with distant metastasis Refer to vascular surgery for Mediport placement Plan systemic chemotherapy with FOLFOX.  #Iron deficiency anemia, hemoglobin 8.4 Patient received 1 unit of PRBC transfusion  and 1 dose of IV Venofer 300 mg during hospitalization.  We will schedule patient additional IV Venofer 200 mg weekly x2.  Discussed about potential side effects of Venofer.  He tolerated Venofer treatment well in the hospital.  #Thrombocytosis, likely due to iron deficiency.  Anticipate to improve with improvement of iron stores. #Leukocytosis, likely due to underlying malignancy.  #Rectal pain, recommend to start Norco 5 mg every 6 hours as needed.  Prescription sent to pharmacy. Patient verbalized understanding that medications should not be sold or shared, taken with alcohol, or used while driving. Patient educated that medications should not be bitten, chewed, or crushed. patient has been made aware of the sside effects and treatment/ prevention  of constipation,  respiratory depression,  and mental status changes, even lead to overdose that may result in death, if used outside of the parameters that we discussed.   #Constipation, recommend patient to take Senokot 2 tablets daily, continue MiraLAX daily as needed.   Orders Placed This Encounter  Procedures  . NM PET Image Initial (PI) Skull Base To Thigh    Standing Status:   Future    Standing Expiration Date:   02/08/2022    Order Specific Question:   If indicated for the ordered procedure, I authorize the  administration of a radiopharmaceutical per Radiology protocol    Answer:   Yes    Order Specific Question:   Preferred imaging location?    Answer:    [facility]    Order Specific Question:   Radiology Contrast Protocol - do NOT remove file path    Answer:   [email or web address]  . MR Pelvis Wo Contrast    Standing Status:   Future    Standing Expiration Date:   02/08/2022    Order Specific Question:   What is the patient's sedation requirement?    Answer:   No Sedation    Order Specific Question:   Does the patient have a pacemaker or implanted devices?    Answer:   No    Order Specific Question:   Preferred imaging location?    Answer:   [facility] (table limit - 550lbs)  . CBC with Differential/Platelet    Standing Status:   Future    Number of Occurrences:   1    Standing Expiration Date:   02/08/2022  . Retic Panel    Standing Status:   Future    Number of Occurrences:   1    Standing Expiration Date:   02/08/2022  . CEA    Standing Status:   Future    Number of Occurrences:   1    Standing Expiration Date:   02/08/2022  . Ambulatory referral to Vascular Surgery    Referral Priority:   Routine    Referral Type:   Surgical    Referral Reason:   Specialty Services Required    Requested Specialty:   Vascular Surgery    Number of Visits Requested:   1    All questions were answered. The patient knows to call the clinic with any problems questions or concerns.  Return of visit: To be determined. Thank you for this kind referral and the opportunity to participate in the care of this patient. A copy of today's note is routed to referring provider    Earlie Server, MD, PhD Hematology Oncology [facility] Pager- [phone] 02/08/2021

## 2021-02-09 LAB — CEA: CEA: 1.9 ng/mL (ref 0.0–4.7)

## 2021-02-10 LAB — CULTURE, BLOOD (ROUTINE X 2)
Culture: NO GROWTH
Culture: NO GROWTH
Special Requests: ADEQUATE

## 2021-02-11 ENCOUNTER — Telehealth: Payer: Self-pay | Admitting: Oncology

## 2021-02-11 ENCOUNTER — Telehealth: Payer: Self-pay

## 2021-02-11 NOTE — Telephone Encounter (Signed)
Spoke with patient to schedule 2 iron infusions. Appointments scheduled. Also reviewed upcoming scans. Sent activation link to MyChart and explained benefits. He stated that he is interested and will sign up.

## 2021-02-11 NOTE — Telephone Encounter (Signed)
Patient notified. Please schedule pt for IV venofer weekly x2 and notify him of appts. If you can please remind him of upcoming scans. Thank you.

## 2021-02-11 NOTE — Telephone Encounter (Signed)
-----   Message from Earlie Server, MD sent at 02/08/2021  9:41 PM EDT ----- Hemoglobin is better but still low, please arrange patient to get IV Venofer weekly x2.-He has had Venofer during hospitalization.

## 2021-02-12 ENCOUNTER — Telehealth (INDEPENDENT_AMBULATORY_CARE_PROVIDER_SITE_OTHER): Payer: Self-pay

## 2021-02-12 NOTE — Telephone Encounter (Signed)
Spoke with the patient and he is scheduled with Dr. Lucky Cowboy for a port placement on 02/18/21 with a 8:15 am arrival time to the [facility]. Pre-procedure instructions were discussed and will be mailed.

## 2021-02-13 ENCOUNTER — Ambulatory Visit
Admission: RE | Admit: 2021-02-13 | Discharge: 2021-02-13 | Disposition: A | Discharge status: Home / Self Care | Payer: Self-pay | Source: Ambulatory Visit | Attending: Oncology | Admitting: Oncology

## 2021-02-13 ENCOUNTER — Telehealth: Payer: Self-pay | Admitting: *Deleted

## 2021-02-13 ENCOUNTER — Other Ambulatory Visit: Payer: Self-pay

## 2021-02-13 DIAGNOSIS — C20 Malignant neoplasm of rectum: Secondary | ICD-10-CM | POA: Insufficient documentation

## 2021-02-13 DIAGNOSIS — I700 Atherosclerosis of aorta: Secondary | ICD-10-CM | POA: Insufficient documentation

## 2021-02-13 DIAGNOSIS — R229 Localized swelling, mass and lump, unspecified: Secondary | ICD-10-CM | POA: Insufficient documentation

## 2021-02-13 LAB — GLUCOSE, CAPILLARY: Glucose-Capillary: 107 mg/dL — ABNORMAL HIGH (ref 70–99)

## 2021-02-13 MED ORDER — FLUDEOXYGLUCOSE F - 18 (FDG) INJECTION
7.9700 | Freq: Once | INTRAVENOUS | Status: AC | PRN
  Administered 2021-02-13: 7.97 via INTRAVENOUS

## 2021-02-13 NOTE — Telephone Encounter (Signed)
Patient wife Wells Guiles called stating that the Nicotine patch given patient is too strong and needs to be reduced. She states she was told that this was a possibility when ordered and to just call and a lower dose could be ordered. Also she reports that patient is having pressure in his abdominal, cannot pass gas and has not had bowel movement since Monday. He has Norco and has tried Adult nurse X for it. Please advise

## 2021-02-14 ENCOUNTER — Emergency Department: Payer: Self-pay

## 2021-02-14 ENCOUNTER — Telehealth: Payer: Self-pay | Admitting: *Deleted

## 2021-02-14 ENCOUNTER — Other Ambulatory Visit: Payer: Self-pay

## 2021-02-14 ENCOUNTER — Encounter: Payer: Self-pay | Admitting: Intensive Care

## 2021-02-14 ENCOUNTER — Encounter: Payer: Self-pay | Admitting: Oncology

## 2021-02-14 ENCOUNTER — Inpatient Hospital Stay: Payer: Self-pay

## 2021-02-14 ENCOUNTER — Telehealth: Payer: Self-pay

## 2021-02-14 ENCOUNTER — Inpatient Hospital Stay
Admission: EM | Admit: 2021-02-14 | Discharge: 2021-02-16 | DRG: 377 | Disposition: A | Discharge status: Home / Self Care | Payer: Self-pay | Attending: Internal Medicine | Admitting: Internal Medicine

## 2021-02-14 DIAGNOSIS — K625 Hemorrhage of anus and rectum: Secondary | ICD-10-CM | POA: Diagnosis present

## 2021-02-14 DIAGNOSIS — E871 Hypo-osmolality and hyponatremia: Secondary | ICD-10-CM | POA: Diagnosis present

## 2021-02-14 DIAGNOSIS — K611 Rectal abscess: Secondary | ICD-10-CM | POA: Diagnosis present

## 2021-02-14 DIAGNOSIS — D500 Iron deficiency anemia secondary to blood loss (chronic): Secondary | ICD-10-CM | POA: Diagnosis present

## 2021-02-14 DIAGNOSIS — D72823 Leukemoid reaction: Secondary | ICD-10-CM | POA: Diagnosis present

## 2021-02-14 DIAGNOSIS — Z79899 Other long term (current) drug therapy: Secondary | ICD-10-CM | POA: Diagnosis not present

## 2021-02-14 DIAGNOSIS — Z20822 Contact with and (suspected) exposure to covid-19: Secondary | ICD-10-CM | POA: Diagnosis present

## 2021-02-14 DIAGNOSIS — C20 Malignant neoplasm of rectum: Secondary | ICD-10-CM | POA: Diagnosis present

## 2021-02-14 DIAGNOSIS — K639 Disease of intestine, unspecified: Secondary | ICD-10-CM

## 2021-02-14 DIAGNOSIS — G893 Neoplasm related pain (acute) (chronic): Secondary | ICD-10-CM

## 2021-02-14 DIAGNOSIS — Z833 Family history of diabetes mellitus: Secondary | ICD-10-CM

## 2021-02-14 DIAGNOSIS — D72829 Elevated white blood cell count, unspecified: Secondary | ICD-10-CM

## 2021-02-14 DIAGNOSIS — Z85048 Personal history of other malignant neoplasm of rectum, rectosigmoid junction, and anus: Secondary | ICD-10-CM | POA: Diagnosis not present

## 2021-02-14 DIAGNOSIS — K631 Perforation of intestine (nontraumatic): Secondary | ICD-10-CM

## 2021-02-14 DIAGNOSIS — K5900 Constipation, unspecified: Secondary | ICD-10-CM | POA: Diagnosis present

## 2021-02-14 DIAGNOSIS — C189 Malignant neoplasm of colon, unspecified: Secondary | ICD-10-CM

## 2021-02-14 DIAGNOSIS — Z809 Family history of malignant neoplasm, unspecified: Secondary | ICD-10-CM

## 2021-02-14 DIAGNOSIS — A419 Sepsis, unspecified organism: Secondary | ICD-10-CM | POA: Diagnosis present

## 2021-02-14 DIAGNOSIS — C218 Malignant neoplasm of overlapping sites of rectum, anus and anal canal: Secondary | ICD-10-CM

## 2021-02-14 DIAGNOSIS — K6289 Other specified diseases of anus and rectum: Secondary | ICD-10-CM

## 2021-02-14 DIAGNOSIS — Z87891 Personal history of nicotine dependence: Secondary | ICD-10-CM

## 2021-02-14 DIAGNOSIS — Z91013 Allergy to seafood: Secondary | ICD-10-CM | POA: Diagnosis not present

## 2021-02-14 DIAGNOSIS — R935 Abnormal findings on diagnostic imaging of other abdominal regions, including retroperitoneum: Secondary | ICD-10-CM

## 2021-02-14 HISTORY — DX: Malignant neoplasm of rectum: C20

## 2021-02-14 LAB — CBC
HCT: 25 % — ABNORMAL LOW (ref 39.0–52.0)
HCT: 21.2 % — ABNORMAL LOW (ref 39.0–52.0)
Hemoglobin: 8.1 g/dL — ABNORMAL LOW (ref 13.0–17.0)
Hemoglobin: 6.9 g/dL — ABNORMAL LOW (ref 13.0–17.0)
MCH: 21.4 pg — ABNORMAL LOW (ref 26.0–34.0)
MCH: 21.2 pg — ABNORMAL LOW (ref 26.0–34.0)
MCHC: 32.4 g/dL (ref 30.0–36.0)
MCHC: 32.5 g/dL (ref 30.0–36.0)
MCV: 66 fL — ABNORMAL LOW (ref 80.0–100.0)
MCV: 65 fL — ABNORMAL LOW (ref 80.0–100.0)
Platelets: 659 10*3/uL — ABNORMAL HIGH (ref 150–400)
Platelets: 609 10*3/uL — ABNORMAL HIGH (ref 150–400)
RBC: 3.79 MIL/uL — ABNORMAL LOW (ref 4.22–5.81)
RBC: 3.26 MIL/uL — ABNORMAL LOW (ref 4.22–5.81)
RDW: 20.1 % — ABNORMAL HIGH (ref 11.5–15.5)
RDW: 20.3 % — ABNORMAL HIGH (ref 11.5–15.5)
WBC: 50.1 10*3/uL (ref 4.0–10.5)
WBC: 49.5 10*3/uL — ABNORMAL HIGH (ref 4.0–10.5)
nRBC: 0.1 % (ref 0.0–0.2)
nRBC: 0 % (ref 0.0–0.2)

## 2021-02-14 LAB — CBC WITH DIFFERENTIAL/PLATELET
Abs Immature Granulocytes: 1.24 10*3/uL — ABNORMAL HIGH (ref 0.00–0.07)
Basophils Absolute: 0.1 10*3/uL (ref 0.0–0.1)
Basophils Relative: 0 %
Eosinophils Absolute: 0 10*3/uL (ref 0.0–0.5)
Eosinophils Relative: 0 %
HCT: 23.6 % — ABNORMAL LOW (ref 39.0–52.0)
Hemoglobin: 7.7 g/dL — ABNORMAL LOW (ref 13.0–17.0)
Immature Granulocytes: 3 %
Lymphocytes Relative: 4 %
Lymphs Abs: 2 10*3/uL (ref 0.7–4.0)
MCH: 21.6 pg — ABNORMAL LOW (ref 26.0–34.0)
MCHC: 32.6 g/dL (ref 30.0–36.0)
MCV: 66.3 fL — ABNORMAL LOW (ref 80.0–100.0)
Monocytes Absolute: 3.9 10*3/uL — ABNORMAL HIGH (ref 0.1–1.0)
Monocytes Relative: 8 %
Neutro Abs: 42.5 10*3/uL — ABNORMAL HIGH (ref 1.7–7.7)
Neutrophils Relative %: 85 %
Platelets: 637 10*3/uL — ABNORMAL HIGH (ref 150–400)
RBC: 3.56 MIL/uL — ABNORMAL LOW (ref 4.22–5.81)
RDW: 20.2 % — ABNORMAL HIGH (ref 11.5–15.5)
Smear Review: INCREASED
WBC: 49.8 10*3/uL — ABNORMAL HIGH (ref 4.0–10.5)
nRBC: 0 % (ref 0.0–0.2)

## 2021-02-14 LAB — RESP PANEL BY RT-PCR (FLU A&B, COVID) ARPGX2
Influenza A by PCR: NEGATIVE
Influenza B by PCR: NEGATIVE
SARS Coronavirus 2 by RT PCR: NEGATIVE

## 2021-02-14 LAB — LACTIC ACID, PLASMA
Lactic Acid, Venous: 1.7 mmol/L (ref 0.5–1.9)
Lactic Acid, Venous: 2 mmol/L (ref 0.5–1.9)
Lactic Acid, Venous: 1.4 mmol/L (ref 0.5–1.9)

## 2021-02-14 LAB — COMPREHENSIVE METABOLIC PANEL
ALT: 20 U/L (ref 0–44)
AST: 17 U/L (ref 15–41)
Albumin: 2.5 g/dL — ABNORMAL LOW (ref 3.5–5.0)
Alkaline Phosphatase: 97 U/L (ref 38–126)
Anion gap: 9 (ref 5–15)
BUN: 13 mg/dL (ref 6–20)
CO2: 27 mmol/L (ref 22–32)
Calcium: 8.4 mg/dL — ABNORMAL LOW (ref 8.9–10.3)
Chloride: 96 mmol/L — ABNORMAL LOW (ref 98–111)
Creatinine, Ser: 1.01 mg/dL (ref 0.61–1.24)
GFR, Estimated: >60 mL/min (ref >60)
Glucose, Bld: 115 mg/dL — ABNORMAL HIGH (ref 70–99)
Potassium: 4.7 mmol/L (ref 3.5–5.1)
Sodium: 132 mmol/L — ABNORMAL LOW (ref 135–145)
Total Bilirubin: 0.5 mg/dL (ref 0.3–1.2)
Total Protein: 8.7 g/dL — ABNORMAL HIGH (ref 6.5–8.1)

## 2021-02-14 LAB — URINALYSIS, COMPLETE (UACMP) WITH MICROSCOPIC
Bilirubin Urine: NEGATIVE
Glucose, UA: 150 mg/dL — AB
Hgb urine dipstick: NEGATIVE
Ketones, ur: NEGATIVE mg/dL
Leukocytes,Ua: NEGATIVE
Nitrite: NEGATIVE
Protein, ur: 30 mg/dL — AB
Specific Gravity, Urine: 1.015 (ref 1.005–1.030)
Squamous Epithelial / HPF: NONE SEEN (ref 0–5)
pH: 7 (ref 5.0–8.0)

## 2021-02-14 LAB — APTT: aPTT: 39 seconds — ABNORMAL HIGH (ref 24–36)

## 2021-02-14 LAB — PROTIME-INR
INR: 1.2 (ref 0.8–1.2)
Prothrombin Time: 15.6 seconds — ABNORMAL HIGH (ref 11.4–15.2)

## 2021-02-14 MED ORDER — SODIUM CHLORIDE 0.9% FLUSH
3.0000 mL | Freq: Two times a day (BID) | INTRAVENOUS | Status: DC
  Administered 2021-02-15 – 2021-02-16 (×3): 3 mL via INTRAVENOUS

## 2021-02-14 MED ORDER — LACTATED RINGERS IV BOLUS
1000.0000 mL | Freq: Once | INTRAVENOUS | Status: AC
  Administered 2021-02-14: 1000 mL via INTRAVENOUS

## 2021-02-14 MED ORDER — SODIUM CHLORIDE 0.9% IV SOLUTION: Freq: Once | INTRAVENOUS | Status: AC

## 2021-02-14 MED ORDER — SODIUM CHLORIDE 0.9 % IV SOLN
250.0000 mL | INTRAVENOUS | Status: DC | PRN
  Administered 2021-02-16: 250 mL via INTRAVENOUS

## 2021-02-14 MED ORDER — PIPERACILLIN-TAZOBACTAM 3.375 G IVPB 30 MIN
3.3750 g | Freq: Once | INTRAVENOUS | Status: AC
  Administered 2021-02-14: 3.375 g via INTRAVENOUS
  Filled 2021-02-14: qty 50

## 2021-02-14 MED ORDER — ACETAMINOPHEN 650 MG RE SUPP: 650.0000 mg | Freq: Four times a day (QID) | RECTAL | Status: DC | PRN

## 2021-02-14 MED ORDER — ONDANSETRON HCL 4 MG/2ML IJ SOLN: 4.0000 mg | Freq: Four times a day (QID) | INTRAMUSCULAR | Status: DC | PRN

## 2021-02-14 MED ORDER — ACETAMINOPHEN 325 MG PO TABS
650.0000 mg | ORAL_TABLET | Freq: Four times a day (QID) | ORAL | Status: DC | PRN
  Administered 2021-02-14: 650 mg via ORAL
  Filled 2021-02-14: qty 2

## 2021-02-14 MED ORDER — SODIUM CHLORIDE 0.9% FLUSH: 3.0000 mL | INTRAVENOUS | Status: DC | PRN

## 2021-02-14 MED ORDER — ACETAMINOPHEN 325 MG PO TABS
650.0000 mg | ORAL_TABLET | ORAL | Status: DC | PRN
  Administered 2021-02-14 – 2021-02-16 (×4): 650 mg via ORAL
  Filled 2021-02-14 (×6): qty 2

## 2021-02-14 MED ORDER — ACETAMINOPHEN 325 MG PO TABS: 650.0000 mg | ORAL_TABLET | Freq: Four times a day (QID) | ORAL | Status: DC | PRN

## 2021-02-14 MED ORDER — LACTATED RINGERS IV SOLN: INTRAVENOUS | Status: AC

## 2021-02-14 MED ORDER — ACETAMINOPHEN 650 MG RE SUPP: 650.0000 mg | RECTAL | Status: DC | PRN

## 2021-02-14 MED ORDER — ONDANSETRON HCL 4 MG PO TABS: 4.0000 mg | ORAL_TABLET | Freq: Four times a day (QID) | ORAL | Status: DC | PRN

## 2021-02-14 MED ORDER — ACETAMINOPHEN 325 MG PO TABS: 650.0000 mg | ORAL_TABLET | ORAL | Status: DC | PRN

## 2021-02-14 MED ORDER — OXYCODONE-ACETAMINOPHEN 5-325 MG PO TABS
1.0000 | ORAL_TABLET | Freq: Once | ORAL | Status: AC
  Administered 2021-02-14: 1 via ORAL
  Filled 2021-02-14: qty 1

## 2021-02-14 MED ORDER — NICOTINE 21 MG/24HR TD PT24
21.0000 mg | MEDICATED_PATCH | Freq: Every day | TRANSDERMAL | Status: DC
  Administered 2021-02-14 – 2021-02-16 (×3): 21 mg via TRANSDERMAL
  Filled 2021-02-14 (×3): qty 1

## 2021-02-14 MED ORDER — ASPIRIN EC 81 MG PO TBEC
81.0000 mg | DELAYED_RELEASE_TABLET | Freq: Every day | ORAL | Status: DC
  Administered 2021-02-14: 81 mg via ORAL
  Filled 2021-02-14: qty 1

## 2021-02-14 MED ORDER — PIPERACILLIN-TAZOBACTAM 3.375 G IVPB 30 MIN: 3.3750 g | Freq: Once | INTRAVENOUS | Status: DC

## 2021-02-14 MED ORDER — PANTOPRAZOLE SODIUM 40 MG IV SOLR
40.0000 mg | Freq: Every day | INTRAVENOUS | Status: DC
  Administered 2021-02-14 – 2021-02-16 (×3): 40 mg via INTRAVENOUS
  Filled 2021-02-14 (×3): qty 40

## 2021-02-14 NOTE — H&P (Signed)
History and Physical    Barry [name]. WUJ:[id number] DOB: Aug 16, 1966 DOA: 02/14/2021  PCP: Patient, No Pcp Per (Inactive)    Patient coming from:  Home    Chief Complaint:  Rectal bleeding    HPI: Barry [name]. is a 55 y.o. male seen in ed with complaints of  BRBPR and dark rectal bleeding. Pt is diagnosed with cancer about two weeks ago.patient came today with complaints of bleeding and perirectal pain. Rectal pain 10/10, dull, pressure like x 24 hours, no alleviating or aggravating factors noted except for moving making pain worse, is passing gas and has some BM with small caliber stool.  He was recently discharged on 1 June after being admitted for rectal thickening and generalized weakness and failure lymphadenopathy patient had a colonoscopy on 1 June which showed a fungating mass found to have adenocarcinoma and was started on chemotherapy regimen.  The rectal pain was worsening and progressive overnight and it was sharp, patient denied any fevers or chills or back pain nausea or vomiting.  Patient has white count of 40.  He is not immunocompromised or started on chemotherapy suspect  leucocytosis is from infection and perforation in this patient.   Pt has past medical history of  Rectal cancer,former smoker, hyponatremia and h/o proctitis, allergies to shellfish.  ED Course:  Vitals:   02/14/21 1205 02/14/21 1206 02/14/21 1406 02/14/21 1430  BP: 123/81  108/78 110/61  Pulse: (!) 108  (!) 105 (!) 107  Resp: 16  18 17   Temp: 98.9 F (37.2 C)     TempSrc: Oral     SpO2: 100%  99% 99%  Weight:  67.6 kg    Height:  5\' 8"  (1.727 m)    In ed pt is alert,awake and afebrile.Vitals are stable except for tachycardia.cbc shows leucocytosis and pt et sepsis criteria and was started on zosyn, LR bolus 1 L and lr IVMF, and percocet for pain. Pt on ct found to have Signs of presumed perforated rectal mass with contained perforation with fluid and gas extending above and below the  pelvic floor, potentially involving the sphincter complex and extending into LEFT ischial rectal fossa. Pet Scan shows  spiculated nodule in left upper lobe.  Labs shows lactic of 1.7, cbc shows wbc of 49.8 and hb of 7.7 and platelet of 637. Ct scan shows rectal mass with contained peroration. Gen surg was consulted and  I have texted oncology for consult for evaluated and management of leucocytosis and cancer.  Gen surg has been consulted for rectal mass and perforation. Pt seen by zachary schulz.   Review of Systems:  Review of Systems  Genitourinary:        Rectal pain 10/10, dull, pressure like x 24 hours, no alleviating or aggravating factors noted except for moving making pain worse, is passing gas and has some BM with small caliber stool.    All other systems reviewed and are negative.   Past Medical History:  Diagnosis Date   Headache    Left shoulder pain    Rectal cancer [facility])     Past Surgical History:  Procedure Laterality Date   COLONOSCOPY WITH PROPOFOL N/A 02/06/2021   Procedure: COLONOSCOPY WITH PROPOFOL;  Surgeon: Lin Landsman, MD;  Location: [facility];  Service: Gastroenterology;  Laterality: N/A;   ROTATOR CUFF REPAIR Left      reports that he quit smoking 10 days ago. His smoking use included cigars and cigarettes. He has a 10.00  pack-year smoking history. He has never used smokeless tobacco. He reports previous alcohol use. He reports that he does not use drugs.  Allergies  Allergen Reactions   Shellfish Allergy Swelling    Family History  Problem Relation Age of Onset   Cancer Sister    Diabetes Mother    Cancer Maternal Grandmother    Cancer Paternal Grandmother     Prior to Admission medications   Medication Sig Start Date End Date Taking? Authorizing Provider  acetaminophen (TYLENOL) 325 MG tablet Take 650 mg by mouth every 6 (six) hours as needed.    [provider]  alum & mag hydroxide-simeth (MAALOX/MYLANTA) 200-200-20  MG/5ML suspension Take 15 mLs by mouth daily. Patient not taking: Reported on 02/08/2021    [provider]  feeding supplement (ENSURE ENLIVE / ENSURE PLUS) LIQD Take 237 mLs by mouth daily. 02/07/21   Lorella Nimrod, MD  ferrous sulfate 325 (65 FE) MG tablet Take 1 tablet (325 mg total) by mouth 2 (two) times daily with a meal. 02/06/21   Lorella Nimrod, MD  HYDROcodone-acetaminophen (NORCO) 5-325 MG tablet Take 1 tablet by mouth every 6 (six) hours as needed for moderate pain. 02/08/21   Earlie Server, MD  Multiple Vitamin (MULTIVITAMIN WITH MINERALS) TABS tablet Take 1 tablet by mouth daily.    [provider]  nicotine (NICODERM CQ - DOSED IN MG/24 HOURS) 21 mg/24hr patch Place 1 patch (21 mg total) onto the skin daily. 02/07/21   Lorella Nimrod, MD  polyethylene glycol powder (GLYCOLAX/MIRALAX) 17 GM/SCOOP powder 1 cap full in a full glass of water, once a day as needed for constipation 01/08/20   Carrie Mew, MD  senna-docusate (SENOKOT-S) 8.6-50 MG tablet Take 2 tablets by mouth daily. 02/08/21   Earlie Server, MD    Physical Exam: Vitals:   02/14/21 1205 02/14/21 1206 02/14/21 1406 02/14/21 1430  BP: 123/81  108/78 110/61  Pulse: (!) 108  (!) 105 (!) 107  Resp: 16  18 17   Temp: 98.9 F (37.2 C)     TempSrc: Oral     SpO2: 100%  99% 99%  Weight:  67.6 kg    Height:  5\' 8"  (1.727 m)     Physical Exam Vitals and nursing note reviewed.  Constitutional:      Appearance: Normal appearance.  HENT:     Head: Normocephalic and atraumatic.     Right Ear: External ear normal.     Left Ear: External ear normal.     Nose: Nose normal.     Mouth/Throat:     Mouth: Mucous membranes are moist.  Eyes:     Extraocular Movements: Extraocular movements intact.     Pupils: Pupils are equal, round, and reactive to light.  Cardiovascular:     Rate and Rhythm: Normal rate and regular rhythm.     Pulses: Normal pulses.     Heart sounds: Normal heart sounds.  Abdominal:     General: Bowel  sounds are normal. There is no distension.     Palpations: Abdomen is soft.     Tenderness: There is no abdominal tenderness. There is no guarding.  Musculoskeletal:     Right lower leg: No edema.     Left lower leg: No edema.  Skin:    General: Skin is warm.  Neurological:     General: No focal deficit present.     Mental Status: He is alert and oriented to person, place, and time.  Psychiatric:  Mood and Affect: Mood normal.        Behavior: Behavior normal.    Labs on Admission: I have personally reviewed following labs and imaging studies  No results for input(s): CKTOTAL, CKMB, TROPONINI in the last 72 hours. Lab Results  Component Value Date   WBC 49.8 (H) 02/14/2021   HGB 7.7 (L) 02/14/2021   HCT 23.6 (L) 02/14/2021   MCV 66.3 (L) 02/14/2021   PLT 637 (H) 02/14/2021    Recent Labs  Lab 02/14/21 1210  NA 132*  K 4.7  CL 96*  CO2 27  BUN 13  CREATININE 1.01  CALCIUM 8.4*  PROT 8.7*  BILITOT 0.5  ALKPHOS 97  ALT 20  AST 17  GLUCOSE 115*   No results found for: CHOL, HDL, LDLCALC, TRIG No results found for: DDIMER Invalid input(s): POCBNP  Urinalysis    Component Value Date/Time   COLORURINE YELLOW (A) 02/14/2021 1315   APPEARANCEUR CLEAR (A) 02/14/2021 1315   LABSPEC 1.015 02/14/2021 1315   PHURINE 7.0 02/14/2021 1315   GLUCOSEU 150 (A) 02/14/2021 1315   HGBUR NEGATIVE 02/14/2021 1315   Jeddito 02/14/2021 1315   KETONESUR NEGATIVE 02/14/2021 1315   PROTEINUR 30 (A) 02/14/2021 1315   UROBILINOGEN 0.2 01/15/2015 0808   NITRITE NEGATIVE 02/14/2021 1315   LEUKOCYTESUR NEGATIVE 02/14/2021 1315   COVID-19 Labs  No results for input(s): DDIMER, FERRITIN, LDH, CRP in the last 72 hours.  Lab Results  Component Value Date   SARSCOV2NAA NEGATIVE 02/14/2021   Matthews NEGATIVE 02/05/2021   Irving Not Detected 04/04/2019    Radiological Exams on Admission: CT ABDOMEN PELVIS WO CONTRAST  Result Date: 02/14/2021 CLINICAL  DATA:  New rectal cancer with severe pain. EXAM: CT ABDOMEN AND PELVIS WITHOUT CONTRAST TECHNIQUE: Multidetector CT imaging of the abdomen and pelvis was performed following the standard protocol without IV contrast. COMPARISON:  PET CT that was performed on February 13, 2021. FINDINGS: Lower chest: Incidental imaging of the lung bases is unremarkable. Hepatobiliary: Small low-density lesion in the RIGHT hepatic lobe (image 9/2 6 mm, likely a small cyst. Small cyst along the RIGHT hepatic margin. No pericholecystic stranding. No gross biliary duct distension. Pancreas: Pancreas with mild atrophy. No sign of adjacent inflammation. Spleen: Spleen normal size and contour. Adrenals/Urinary Tract: Adrenal glands are normal. Symmetric smooth contour of bilateral kidneys. No hydronephrosis. No nephrolithiasis. Urinary bladder with smooth contours but extending into the lower abdomen with moderate to marked distension. Stomach/Bowel: Stomach without evidence of adjacent stranding. No small bowel dilation. Normal appendix. Colon is stool filled without signs of obstruction. At the descending/sigmoid junction there is a linear area of increased calcific density within the fecal stream potentially a small fishbone. No adjacent stranding or free air. Distal to this is the large irregular colonic tumor with increasing size of a horseshoe shaped area of low attenuation about the rectum. Small fluid collection lateral to the pubic 0 wreck talus muscle on image 82 of series 2 contains gas and has developed in the short interval since the study of Feb 05, 2021, quite subtle on the PET scan of only 1 day ago now containing gas, not containing gas on the recent PET exam. Small amount a of gas also seen in the fluid density that tracks into the perianal region, potentially within the sphincter complex in the intersphincteric plane though not clearly delineated on the current study bulging normal anatomy along the region of the sphincter  complex. Ulcerated and irregular circumferential mass. Fluid  begins above the pelvic floor and tracks below the pelvic floor along with LEFT ischial rectal fossa collection described above. Associated nodal enlargement in the RIGHT hemipelvis as as outlined on the previous PET exam along with hypogastric lymph nodes and extensive mesorectal soft tissue. Inguinal lymph nodes with enlargement, potentially reactive given above findings. No free intraperitoneal air. Vascular/Lymphatic: No retroperitoneal adenopathy. Calcified atheromatous plaque of the abdominal aorta. No aneurysmal dilation. Reproductive: Inseparable from the process in the mesorectum. Other: No free air.  No ascites. Musculoskeletal: No acute bone finding or destructive bone process. Spinal degenerative changes.  IMPRESSION: Signs of presumed perforated rectal mass with contained perforation with fluid and gas extending above and below the pelvic floor, potentially involving the sphincter complex and extending into LEFT ischial rectal fossa. GI and or surgical consultation may be helpful for further evaluation. Fluid and gas has developed since the Feb 06, 2020 evaluation where there was only minimal low-attenuation outside of the rectum. Signs of nodal disease in the pelvis is outlined on the PET exam. Electronically Signed   By: Zetta Bills M.D.   On: 02/14/2021 13:38   NM PET Image Initial (PI) Skull Base To Thigh  Addendum Date: 02/14/2021   ADDENDUM REPORT: 02/14/2021 13:43 ADDENDUM: For clarification the comparison from May 2nd is from 2021. Nodal disease that is present on the current study and the most recent comparison is not visible on the previous more remote CT assessment. Nor is the fluid density in the area of the mesorectum that tracks above and below the pelvic floor as described. Additionally, there is a spiculated nodule in the LEFT upper lobe with morphologic features that raise the question of small bronchogenic neoplasm  measuring 9 x 8 mm (8 x 8 mm in the initial report). Referral to multi disciplinary thoracic oncologic setting may be helpful for further assessment. In the current context could consider short interval follow-up or biopsy for further assessment as warranted. These results will be called to the ordering clinician or representative by the Radiologist Assistant, and communication documented in the PACS or [employer]. Electronically Signed   By: Zetta Bills M.D.   On: 02/14/2021 13:43   Addendum Date: 02/14/2021   ADDENDUM REPORT: 02/14/2021 13:21 ADDENDUM: These results were called to the ordering clinician or representative by the Radiologist Assistant, and communication documented in the PACS or [employer]. Electronically Signed   By: Zetta Bills M.D.   On: 02/14/2021 13:21   Result Date: 02/14/2021 CLINICAL DATA:  Initial treatment strategy for rectal cancer in a 55 year old male. Patient reports severe pain in symptoms that made the examination challenging. EXAM: NUCLEAR MEDICINE PET SKULL BASE TO THIGH TECHNIQUE: 7.97 mCi F-18 FDG was injected intravenously. Full-ring PET imaging was performed from the skull base to thigh after the radiotracer. CT data was obtained and used for attenuation correction and anatomic localization. Fasting blood glucose: 107 mg/dl COMPARISON:  CT of the abdomen and pelvis Feb 05, 2021 and CT of the abdomen and pelvis Jan 07, 2021. FINDINGS: Mediastinal blood pool activity: SUV max 2.05 Liver activity: SUV max NA NECK: Asymmetric muscular activity in the neck in prevertebral musculature. LEFT over RIGHT without visible lesion. Incidental CT findings: none CHEST: Spiculated nodule in the LEFT upper lobe measures approximately 8 x 8 mm with some surrounding spiculation (image 95/1) metabolic activity not elevated above mediastinal blood pool. Signs of pulmonary emphysema. Airways are patent. No adenopathy in the chest or signs of suspicious nodule elsewhere. Incidental  CT findings: Calcified atherosclerotic plaque of the thoracic aorta. 4 cm caliber of the ascending thoracic aorta. Minimal scattered atherosclerotic plaque with calcification. Heart size normal without pericardial effusion. Normal caliber central pulmonary vessels. Normal appearance of the esophagus. ABDOMEN/PELVIS: No signs of solid organ metastasis. Cyst in the RIGHT hepatic lobe. Marked hypermetabolic activity about the patient's known rectal mass. This mass appears less well-defined. The mesorectum is filled with low attenuation to the LEFT posterolateral aspect of the mesorectum with crescentic appearance tracking into the upper sphincter complex, horseshoe shaped low attenuation surrounds the rectum and anus as it extends towards the pelvic floor. Mass measuring approximately 6.5 x 4.9 cm (image 248/3) maximum SUV 17.6. Lenticular area of low attenuation with some peripheral increased metabolic activity, more nodular along the LEFT posterolateral margin on image 257 of series 3. This area measures approximately 2.7 cm in greatest thickness and surrounds the rectum and anus approximately 270 degrees of its circumference above the pelvic floor and at the pelvic floor slightly less. Bulky RIGHT pelvic sidewall/hypogastric lymph node outside of the mesorectum with stippled calcification measuring 19 mm and without signs of increased metabolic activity. Small LEFT hypogastric lymph node just peripheral to the internal external bifurcation on image 228 of series 3 displays low level FDG uptake with a maximum SUV of 3.2. High RIGHT internal just below or at the internal/common iliac transition, lymph node (image 214/3) 12 mm maximum SUV of 4.8. LEFT high hypogastric lymph node (image 217/3) 8 mm with low level FDG uptake, maximum SUV of 3.0. Scattered lymph nodes throughout the retroperitoneum with FDG uptake only slightly above mediastinal blood pool. For instance on image 181 of series 3 is an 8 mm lymph node with  a maximum SUV of 2.2 along the LEFT periaortic chain. Bilateral inguinal lymph nodes largest on the RIGHT (image 248/3) 11 mm with a maximum SUV of 2.8 Primary tumor difficult to assess in terms of margins due to surrounding stranding which has worsened since previous imaging. Incidental CT findings: Tiny hypodensity in the RIGHT hemi liver (image 138/3) no focal increased FDG uptake though this 4 mm area may be below PET resolution in this context. SKELETON: Diffuse increased metabolic activity throughout the visualized skeleton without focal area of activity to suggest bony metastasis. Incidental CT findings: none IMPRESSION: Signs of locally advanced rectal cancer with ballooning of the mesorectum now with low attenuation material that was not present on previous examinations (not present on 01/13/23 and much less pronounced on 02/11/2023) with extensive stranding and inflammation. Findings suspicious for rectal compromise/focal defect in the rectum at the site of tumor or along the LEFT posterolateral aspect and contained inflammation in the mesorectum. Rectal tumor also appears bulkier in general and part of this could be due to marked worsening in short interval of rectal tumor. Large lymph node along the RIGHT pelvic sidewall and smaller lymph nodes as outlined above in the pelvis outside of the mesorectum including inguinal lymph nodes which are suspicious for disease. Note that the dominant lymph node displays little FDG uptake, potentially due to necrosis. Lymph nodes in the groin are equivocal in the setting of extensive inflammation and potentially reactive showing enlargement since previous imaging from 01/13/23. No signs of solid organ metastasis. Diffuse marrow uptake likely related to symptomatic anemia. Electronically Signed: By: Zetta Bills M.D. On: 02/14/2021 08:58    Assessment/Plan Principal Problem:   BRBPR (bright red blood per rectum) Active Problems:   Bowel wall thickening  Rectal  cancer (HCC)   Iron deficiency anemia due to chronic blood loss   BRBPR/ BW thickening/ Rectal cancer: Ct shows rectal mass with perforation and gen surg consulted. Oncology has been texted no reply.We will cont pt on ivf and iv abx and Repeat imaging once clinical improvement.   IDA: Type and screen and transfuse if hb is 7 or below.  Attribute to c/hospital blood loss rectal cancer.   Tobacco abuse: Pt has quit since his last admission and is on nicotine patch.   Abnormal PET scan: Eval once pt's active issue has been stabilized and managed.    DVT prophylaxis:  Heparin    Code Status:  Full code   Family Communication:  White,Rebecca (Spouse)  [phone] (Mobile)   Disposition Plan:  Home    Consults called:  General surgery. Oncology contacted.   Admission status: Inpatient.     Para Skeans MD [facility] [phone] How to contact the [facility] Attending or Consulting provider [place] or covering provider during after hours [place], for this patient.    Check the care team in [facility] and look for a) attending/consulting [facility] provider listed and b) the [facility] team listed Log into [email or web address] and use [place] universal password to access. If you do not have the password, please contact the hospital operator. Locate the [facility] provider you are looking for under [facility] and page to a number that you can be directly reached. If you still have difficulty reaching the provider, please page the [facility] (Director on Call) for the Hospitalists listed on amion for assistance. [email or web address] Password [facility] 02/14/2021, 3:47 PM

## 2021-02-14 NOTE — Telephone Encounter (Signed)
Called addended report  ADDENDUM: For clarification the comparison from May 2nd is from 2021. Nodal disease that is present on the current study and the most recent comparison is not visible on the previous more remote CT assessment. Nor is the fluid density in the area of the mesorectum that tracks above and below the pelvic floor as described.   Additionally, there is a spiculated nodule in the LEFT upper lobe with morphologic features that raise the question of small bronchogenic neoplasm measuring 9 x 8 mm (8 x 8 mm in the initial report). Referral to multi disciplinary thoracic oncologic setting may be helpful for further assessment. In the current context could consider short interval follow-up or biopsy for further assessment as warranted.   These results will be called to the ordering clinician or representative by the Radiologist Assistant, and communication documented in the PACS or [employer].     Electronically Signed   By: Zetta Bills M.D.   On: 02/14/2021 13:43

## 2021-02-14 NOTE — ED Notes (Signed)
Informed RN bed assigned 

## 2021-02-14 NOTE — Consult Note (Addendum)
[facility] SURGICAL CONSULTATION NOTE (initial) - cpt: 9243   HISTORY OF PRESENT ILLNESS (HPI):  55 y.o. male presented to [facility] ED today for evaluation of rectal pain. Patient has a recent admission from 05/31 - 06/01 secondary to generalized weakness, lightheadedness, weight loss, and constipation. During that admission, he was found to have rectal wall thickening and iliac lymphadenopathy. He underwent colonoscopy with Dr Marius Ditch on 06/01 (which I have personally reviewed), and this showed large fungating mass, 0.5 cm from anal verge. This was biopsied and showed  moderately differentiated adenocarcinoma. He followed up with Dr Tasia Catchings on 06/03 and recommended initiation of FOLFOX. He is scheduled for port-a-catheter placement with Dr Lucky Cowboy on 06/13. However, he reports overnight, he had severe rectal pain. He reports he has had pain this entire time but it was acutely worse overnight. Since coming to the ED, he feels his pain is markedly improved. He denied any fever, chills, nausea, emesis, distension, or blood in his stool. He does report he has continued to pass flatus and have "small pebble like" stools. No other previous intra-abdominal surgeries. Work up in the ED revealed a leukocytosis to 50.1K (24.2 six days ago). He is not immunocompromised, has not started chemotherapy, and is not on steroids. His renal function is normal, with sCr - 1.01. Mild hyponatremia to 132. Normal lactic acid level at 1.7. He did undergo CT Abdomen/Pelvis which was concerning for new fluid surrounding the rectum concerning for potential contained perforation, no free air, no evidence of complete obstruction.   Surgery is consulted by emergency medicine physician Dr. Hulan Saas, MD in this context for evaluation and management of rectal mass with possible contained perforation.   PAST MEDICAL HISTORY (PMH):  Past Medical History:  Diagnosis Date   Headache    Left shoulder pain    Rectal cancer ([place])       PAST SURGICAL HISTORY ([place]):  Past Surgical History:  Procedure Laterality Date   COLONOSCOPY WITH PROPOFOL N/A 02/06/2021   Procedure: COLONOSCOPY WITH PROPOFOL;  Surgeon: Lin Landsman, MD;  Location: [facility] ENDOSCOPY;  Service: Gastroenterology;  Laterality: N/A;   ROTATOR CUFF REPAIR Left      MEDICATIONS:  Prior to Admission medications   Medication Sig Start Date End Date Taking? Authorizing Provider  acetaminophen (TYLENOL) 325 MG tablet Take 650 mg by mouth every 6 (six) hours as needed.    [provider]  alum & mag hydroxide-simeth (MAALOX/MYLANTA) 200-200-20 MG/5ML suspension Take 15 mLs by mouth daily. Patient not taking: Reported on 02/08/2021    [provider]  feeding supplement (ENSURE ENLIVE / ENSURE PLUS) LIQD Take 237 mLs by mouth daily. 02/07/21   Lorella Nimrod, MD  ferrous sulfate 325 (65 FE) MG tablet Take 1 tablet (325 mg total) by mouth 2 (two) times daily with a meal. 02/06/21   Lorella Nimrod, MD  HYDROcodone-acetaminophen (NORCO) 5-325 MG tablet Take 1 tablet by mouth every 6 (six) hours as needed for moderate pain. 02/08/21   Earlie Server, MD  Multiple Vitamin (MULTIVITAMIN WITH MINERALS) TABS tablet Take 1 tablet by mouth daily.    [provider]  nicotine (NICODERM CQ - DOSED IN MG/24 HOURS) 21 mg/24hr patch Place 1 patch (21 mg total) onto the skin daily. 02/07/21   Lorella Nimrod, MD  polyethylene glycol powder (GLYCOLAX/MIRALAX) 17 GM/SCOOP powder 1 cap full in a full glass of water, once a day as needed for constipation 01/08/20   Carrie Mew, MD  senna-docusate (SENOKOT-S) 8.6-50  MG tablet Take 2 tablets by mouth daily. 02/08/21   Earlie Server, MD     ALLERGIES:  Allergies  Allergen Reactions   Shellfish Allergy Swelling     SOCIAL HISTORY:  Social History   Socioeconomic History   Marital status: Married    Spouse name: Not on file   Number of children: Not on file   Years of education: Not on file   Highest education  level: Not on file  Occupational History   Not on file  Tobacco Use   Smoking status: Former    Packs/day: 1.00    Years: 10.00    Pack years: 10.00    Types: Cigars, Cigarettes    Quit date: 02/04/2021    Years since quitting: 0.0   Smokeless tobacco: Never  Substance and Sexual Activity   Alcohol use: Not Currently   Drug use: No   Sexual activity: Not on file  Other Topics Concern   Not on file  Social History Narrative   Not on file   Social Determinants of Health   Financial Resource Strain: Not on file  Food Insecurity: Not on file  Transportation Needs: Not on file  Physical Activity: Not on file  Stress: Not on file  Social Connections: Not on file  Intimate Partner Violence: Not on file     FAMILY HISTORY:  Family History  Problem Relation Age of Onset   Cancer Sister    Diabetes Mother    Cancer Maternal Grandmother    Cancer Paternal Grandmother       REVIEW OF SYSTEMS:  Review of Systems  Constitutional:  Negative for chills and fever.  HENT:  Negative for congestion and sore throat.   Respiratory:  Negative for cough and shortness of breath.   Cardiovascular:  Negative for chest pain and palpitations.  Gastrointestinal:  Positive for abdominal pain (rectal pain). Negative for diarrhea, nausea and vomiting.  Genitourinary:  Negative for dysuria and urgency.  All other systems reviewed and are negative.  VITAL SIGNS:  Temp:  [98.9 F (37.2 C)] 98.9 F (37.2 C) (06/09 1205) Pulse Rate:  [108] 108 (06/09 1205) Resp:  [16] 16 (06/09 1205) BP: (123)/(81) 123/81 (06/09 1205) SpO2:  [100 %] 100 % (06/09 1205) Weight:  [67.6 kg] 67.6 kg (06/09 1206)     Height: 5\' 8"  (172.7 cm) Weight: 67.6 kg BMI (Calculated): 22.66   INTAKE/OUTPUT:  No intake/output data recorded.  PHYSICAL EXAM:  Physical Exam Vitals and nursing note reviewed. Exam conducted with a chaperone present.  Constitutional:      General: He is not in acute distress.    Appearance:  Normal appearance. He is normal weight. He is not ill-appearing.  HENT:     Head: Normocephalic and atraumatic.  Eyes:     General: No scleral icterus.    Conjunctiva/sclera: Conjunctivae normal.     Pupils: Pupils are equal, round, and reactive to light.  Cardiovascular:     Rate and Rhythm: Normal rate.     Pulses: Normal pulses.     Heart sounds: No murmur heard. Pulmonary:     Effort: Pulmonary effort is normal.  Abdominal:     General: Abdomen is flat. There is no distension.     Palpations: Abdomen is soft.     Tenderness: There is no abdominal tenderness.     Comments: Abdomen is soft, non-tender, non-distended, no rebound/guarding   Genitourinary:    Comments: I did defer rectal examination at this time given  his recent colonoscopy, which I reviewed, and lack of blood per rectum Musculoskeletal:     Right lower leg: No edema.     Left lower leg: No edema.  Skin:    General: Skin is warm and dry.     Coloration: Skin is not pale.     Findings: No erythema.  Neurological:     General: No focal deficit present.     Mental Status: He is alert and oriented to person, place, and time.  Psychiatric:        Mood and Affect: Mood normal.        Behavior: Behavior normal.     Labs:  CBC Latest Ref Rng & Units 02/14/2021 02/08/2021 02/06/2021  WBC 4.0 - 10.5 K/uL 50.1(HH) 24.2(H) -  Hemoglobin 13.0 - 17.0 g/dL 8.1(L) 8.4(L) 8.2(L)  Hematocrit 39.0 - 52.0 % 25.0(L) 26.2(L) 25.8(L)  Platelets 150 - 400 K/uL 659(H) 501(H) -   CMP Latest Ref Rng & Units 02/14/2021 02/05/2021 02/05/2021  Glucose 70 - 99 mg/dL 115(H) 89 109(H)  BUN 6 - 20 mg/dL 13 11 12   Creatinine 0.61 - 1.24 mg/dL 1.01 1.14 1.18  Sodium 135 - 145 mmol/L 132(L) 134(L) 132(L)  Potassium 3.5 - 5.1 mmol/L 4.7 4.1 4.0  Chloride 98 - 111 mmol/L 96(L) 99 98  CO2 22 - 32 mmol/L 27 24 25   Calcium 8.9 - 10.3 mg/dL 8.4(L) 8.5(L) 8.5(L)  Total Protein 6.5 - 8.1 g/dL 8.7(H) 8.3(H) 8.4(H)  Total Bilirubin 0.3 - 1.2 mg/dL 0.5  0.5 0.5  Alkaline Phos 38 - 126 U/L 97 79 78  AST 15 - 41 U/L 17 14(L) 16  ALT 0 - 44 U/L 20 13 11      Imaging studies:   CT Abdomen/Pelvis (02/14/2021) personally reviewed showing rectal thickening/mass, new perirectal fluid collection with small foci of air, no pneumoperitoneum, and radiologist report reviewed below:  IMPRESSION: Signs of presumed perforated rectal mass with contained perforation with fluid and gas extending above and below the pelvic floor, potentially involving the sphincter complex and extending into LEFT ischial rectal fossa. GI and or surgical consultation may be helpful for further evaluation. Fluid and gas has developed since the Feb 06, 2020 evaluation where there was only minimal low-attenuation outside of the rectum.   Signs of nodal disease in the pelvis is outlined on the PET exam.   Assessment/Plan: (ICD-10's: K24.89) 55 y.o. male with metastatic rectal mass with likely contained perforation without gross pneumoperitoneum or clinical evidence of obstruction.   - Recommend admission to the medicine service - No emergent surgical intervention currently.He is currently not septic nor peritonitic and there is no evidence of complete obstruction clinically. I do worry that at some point he may become obstructed  which would require palliative diverting ostomy, which he and his wife are aware of.  - I did review images with IR this afternoon who felt this collection would be amenable to drainage/aspiration. Anticipate this being done tomorrow (06/10) - Agree with IV Abx (Zosyn) - Monitor leukocytosis; unsure why 50K; morning labs   - Consider oncology consultation while inpatient - I am okay with diet as tolerated; NPO at midnight for IR - Monitor abdominal examination; on-going bowel function - Pain control prn; antiemetics prn  - DVT prophylaxis; hold for possible IR procedure   All of the above findings and recommendations were discussed with the  patient and his wife, and all of their questions were answered to their expressed satisfaction.  Thank you for  the opportunity to participate in this patient's care.   -- Edison Simon, PA-C [facility] 02/14/2021, 2:06 PM [phone] M-F: 7am - 4pm  I saw and evaluated the patient.  I agree with the above documentation, exam, and plan, which I have edited where appropriate. Fredirick Maudlin  12:57 PM

## 2021-02-14 NOTE — Progress Notes (Signed)
Notified bedside nurse of need to draw blood cultures.  

## 2021-02-14 NOTE — Progress Notes (Signed)
CODE SEPSIS - PHARMACY COMMUNICATION  **Broad Spectrum Antibiotics should be administered within 1 hour of Sepsis diagnosis**  Time Code Sepsis Called/Page Received: 1408  Antibiotics Ordered: Zosyn  Time of 1st antibiotic administration: [place] ,PharmD Clinical Pharmacist  02/14/2021  2:12 PM

## 2021-02-14 NOTE — Progress Notes (Signed)
Following for code sepsis 

## 2021-02-14 NOTE — ED Notes (Signed)
Pt requested  to use restroom and pt ambulated with steady gait. Pt flushed before this RN could see the stool.

## 2021-02-14 NOTE — ED Notes (Signed)
Pt at CT

## 2021-02-14 NOTE — ED Provider Notes (Signed)
[facility] Emergency Department Provider Note  ____________________________________________   Event Date/Time   First MD Initiated Contact with Patient 02/14/21 1228     (approximate)  I have reviewed the triage vital signs and the nursing notes.   HISTORY  Chief Complaint Rectal Bleeding   HPI Barry [name]. is a 55 y.o. male with a past medical history of rectal cancer and anemia requiring blood transfusion recently diagnosed on admission 5/21-6/1 after he presented with some abdominal pain and lower GI bleeding found on colonoscopy to have a fungating mass discharged with oncology follow-up with plan for port placement to start chemotherapy who presents for assessment of some worsening rectal pain sensation of tenesmus with some bright red blood mixed with the stools today.  Patient states he is not having any nausea or vomiting still is passing gas.  States he thinks he has been constipated which is why has been very hesitant to take Norco prescribed to him for pain.  Has been taking stool softeners but still she is feel constipated.  He has not had any significant abdominal pain, back pain, urinary symptoms, chest pain, cough, shortness of breath, headache, sore throat or any other acute sick symptoms.  He did not take any Norco today but did receive 50 mcg of fentanyl with EMS.  No other acute concerns at this time.         Past Medical History:  Diagnosis Date   Headache    Left shoulder pain    Rectal cancer [facility])     Patient Active Problem List   Diagnosis Date Noted   Rectal cancer ([place]) 02/08/2021   Goals of care, counseling/discussion 02/08/2021   Iron deficiency anemia due to chronic blood loss 02/08/2021   Proctitis    Constipation by outlet obstruction 02/05/2021   Nicotine dependence with withdrawal 02/05/2021   Chronic blood loss anemia 02/05/2021   Postural dizziness with presyncope 02/05/2021   Bowel wall thickening  02/05/2021   Hyponatremia 02/05/2021   WEIGHT LOSS 10/25/2010    Past Surgical History:  Procedure Laterality Date   COLONOSCOPY WITH PROPOFOL N/A 02/06/2021   Procedure: COLONOSCOPY WITH PROPOFOL;  Surgeon: Lin Landsman, MD;  Location: [place];  Service: Gastroenterology;  Laterality: N/A;   ROTATOR CUFF REPAIR Left     Prior to Admission medications   Medication Sig Start Date End Date Taking? Authorizing Provider  acetaminophen (TYLENOL) 325 MG tablet Take 650 mg by mouth every 6 (six) hours as needed.    [provider]  alum & mag hydroxide-simeth (MAALOX/MYLANTA) 200-200-20 MG/5ML suspension Take 15 mLs by mouth daily. Patient not taking: Reported on 02/08/2021    [provider]  feeding supplement (ENSURE ENLIVE / ENSURE PLUS) LIQD Take 237 mLs by mouth daily. 02/07/21   Lorella Nimrod, MD  ferrous sulfate 325 (65 FE) MG tablet Take 1 tablet (325 mg total) by mouth 2 (two) times daily with a meal. 02/06/21   Lorella Nimrod, MD  HYDROcodone-acetaminophen (NORCO) 5-325 MG tablet Take 1 tablet by mouth every 6 (six) hours as needed for moderate pain. 02/08/21   Earlie Server, MD  Multiple Vitamin (MULTIVITAMIN WITH MINERALS) TABS tablet Take 1 tablet by mouth daily.    [provider]  nicotine (NICODERM CQ - DOSED IN MG/24 HOURS) 21 mg/24hr patch Place 1 patch (21 mg total) onto the skin daily. 02/07/21   Lorella Nimrod, MD  polyethylene glycol powder (GLYCOLAX/MIRALAX) 17 GM/SCOOP powder 1 cap full in  a full glass of water, once a day as needed for constipation 01/08/20   Carrie Mew, MD  senna-docusate (SENOKOT-S) 8.6-50 MG tablet Take 2 tablets by mouth daily. 02/08/21   Earlie Server, MD    Allergies Shellfish allergy  Family History  Problem Relation Age of Onset   Cancer Sister    Diabetes Mother    Cancer Maternal Grandmother    Cancer Paternal Grandmother     Social History Social History   Tobacco Use   Smoking status: Former    Packs/day:  1.00    Years: 10.00    Pack years: 10.00    Types: Cigars, Cigarettes    Quit date: 02/04/2021    Years since quitting: 0.0   Smokeless tobacco: Never  Substance Use Topics   Alcohol use: Not Currently   Drug use: No    Review of Systems  Review of Systems  Constitutional:  Negative for chills and fever.  HENT:  Negative for sore throat.   Eyes:  Negative for pain.  Respiratory:  Negative for cough and stridor.   Cardiovascular:  Negative for chest pain.  Gastrointestinal:  Positive for abdominal pain, blood in stool and constipation. Negative for vomiting.  Genitourinary:  Negative for dysuria.  Musculoskeletal:  Negative for myalgias.  Skin:  Negative for rash.  Neurological:  Negative for seizures, loss of consciousness and headaches.  Psychiatric/Behavioral:  Negative for suicidal ideas.   All other systems reviewed and are negative.    ____________________________________________   PHYSICAL EXAM:  VITAL SIGNS: ED Triage Vitals  Enc Vitals Group     BP 02/14/21 1205 123/81     Pulse Rate 02/14/21 1205 (!) 108     Resp 02/14/21 1205 16     Temp 02/14/21 1205 98.9 F (37.2 C)     Temp Source 02/14/21 1205 Oral     SpO2 02/14/21 1205 100 %     Weight 02/14/21 1206 149 lb (67.6 kg)     Height 02/14/21 1206 5\' 8"  (1.727 m)     Head Circumference --      Peak Flow --      Pain Score 02/14/21 1206 8     Pain Loc --      Pain Edu? --      Excl. in [place]? --    Vitals:   02/14/21 1406 02/14/21 1430  BP: 108/78 110/61  Pulse: (!) 105 (!) 107  Resp: 18 17  Temp:    SpO2: 99% 99%   Physical Exam Vitals and nursing note reviewed. Exam conducted with a chaperone present.  Constitutional:      Appearance: He is well-developed.  HENT:     Head: Normocephalic and atraumatic.     Right Ear: External ear normal.     Left Ear: External ear normal.     Nose: Nose normal.     Mouth/Throat:     Mouth: Mucous membranes are dry.  Eyes:     Conjunctiva/sclera:  Conjunctivae normal.  Cardiovascular:     Rate and Rhythm: Normal rate and regular rhythm.     Heart sounds: No murmur heard. Pulmonary:     Effort: Pulmonary effort is normal. No respiratory distress.     Breath sounds: Normal breath sounds.  Abdominal:     Palpations: Abdomen is soft.     Tenderness: There is no abdominal tenderness.  Musculoskeletal:     Cervical back: Neck supple.  Skin:    General: Skin is warm and dry.  Capillary Refill: Capillary refill takes less than 2 seconds.  Neurological:     Mental Status: He is alert and oriented to person, place, and time.    No active bleeding on external rectal exam.  There is little bit of fullness palpated around the rectum without fluctuance or induration. ____________________________________________   LABS (all labs ordered are listed, but only abnormal results are displayed)  Labs Reviewed  COMPREHENSIVE METABOLIC PANEL - Abnormal; Notable for the following components:      Result Value   Sodium 132 (*)    Chloride 96 (*)    Glucose, Bld 115 (*)    Calcium 8.4 (*)    Total Protein 8.7 (*)    Albumin 2.5 (*)    All other components within normal limits  CBC - Abnormal; Notable for the following components:   WBC 50.1 (*)    RBC 3.79 (*)    Hemoglobin 8.1 (*)    HCT 25.0 (*)    MCV 66.0 (*)    MCH 21.4 (*)    RDW 20.1 (*)    Platelets 659 (*)    All other components within normal limits  URINALYSIS, COMPLETE (UACMP) WITH MICROSCOPIC - Abnormal; Notable for the following components:   Color, Urine YELLOW (*)    APPearance CLEAR (*)    Glucose, UA 150 (*)    Protein, ur 30 (*)    Bacteria, UA RARE (*)    All other components within normal limits  PROTIME-INR - Abnormal; Notable for the following components:   Prothrombin Time 15.6 (*)    All other components within normal limits  APTT - Abnormal; Notable for the following components:   aPTT 39 (*)    All other components within normal limits  CBC WITH  DIFFERENTIAL/PLATELET - Abnormal; Notable for the following components:   WBC 49.8 (*)    RBC 3.56 (*)    Hemoglobin 7.7 (*)    HCT 23.6 (*)    MCV 66.3 (*)    MCH 21.6 (*)    RDW 20.2 (*)    Platelets 637 (*)    Neutro Abs 42.5 (*)    Monocytes Absolute 3.9 (*)    Abs Immature Granulocytes 1.24 (*)    All other components within normal limits  CULTURE, BLOOD (SINGLE)  URINE CULTURE  RESP PANEL BY RT-PCR (FLU A&B, COVID) ARPGX2  LACTIC ACID, PLASMA  LACTIC ACID, PLASMA  DIFFERENTIAL  TYPE AND SCREEN   ____________________________________________  EKG  ____________________________________________  RADIOLOGY  ED MD interpretation: CT abdomen pelvis remarkable for  evidence of likely perforated rectal mass with contained perforation fluid and gas above the pelvic floor.  Official radiology report(s): CT ABDOMEN PELVIS WO CONTRAST  Result Date: 02/14/2021 CLINICAL DATA:  New rectal cancer with severe pain. EXAM: CT ABDOMEN AND PELVIS WITHOUT CONTRAST TECHNIQUE: Multidetector CT imaging of the abdomen and pelvis was performed following the standard protocol without IV contrast. COMPARISON:  PET CT that was performed on February 13, 2021. FINDINGS: Lower chest: Incidental imaging of the lung bases is unremarkable. Hepatobiliary: Small low-density lesion in the RIGHT hepatic lobe (image 9/2 6 mm, likely a small cyst. Small cyst along the RIGHT hepatic margin. No pericholecystic stranding. No gross biliary duct distension. Pancreas: Pancreas with mild atrophy. No sign of adjacent inflammation. Spleen: Spleen normal size and contour. Adrenals/Urinary Tract: Adrenal glands are normal. Symmetric smooth contour of bilateral kidneys. No hydronephrosis. No nephrolithiasis. Urinary bladder with smooth contours but extending into the lower abdomen  with moderate to marked distension. Stomach/Bowel: Stomach without evidence of adjacent stranding. No small bowel dilation. Normal appendix. Colon is stool  filled without signs of obstruction. At the descending/sigmoid junction there is a linear area of increased calcific density within the fecal stream potentially a small fishbone. No adjacent stranding or free air. Distal to this is the large irregular colonic tumor with increasing size of a horseshoe shaped area of low attenuation about the rectum. Small fluid collection lateral to the pubic 0 wreck talus muscle on image 82 of series 2 contains gas and has developed in the short interval since the study of Feb 05, 2021, quite subtle on the PET scan of only 1 day ago now containing gas, not containing gas on the recent PET exam. Small amount a of gas also seen in the fluid density that tracks into the perianal region, potentially within the sphincter complex in the intersphincteric plane though not clearly delineated on the current study bulging normal anatomy along the region of the sphincter complex. Ulcerated and irregular circumferential mass. Fluid begins above the pelvic floor and tracks below the pelvic floor along with LEFT ischial rectal fossa collection described above. Associated nodal enlargement in the RIGHT hemipelvis as as outlined on the previous PET exam along with hypogastric lymph nodes and extensive mesorectal soft tissue. Inguinal lymph nodes with enlargement, potentially reactive given above findings. No free intraperitoneal air. Vascular/Lymphatic: No retroperitoneal adenopathy. Calcified atheromatous plaque of the abdominal aorta. No aneurysmal dilation. Reproductive: Inseparable from the process in the mesorectum. Other: No free air.  No ascites. Musculoskeletal: No acute bone finding or destructive bone process. Spinal degenerative changes. IMPRESSION: Signs of presumed perforated rectal mass with contained perforation with fluid and gas extending above and below the pelvic floor, potentially involving the sphincter complex and extending into LEFT ischial rectal fossa. GI and or surgical  consultation may be helpful for further evaluation. Fluid and gas has developed since the Feb 06, 2020 evaluation where there was only minimal low-attenuation outside of the rectum. Signs of nodal disease in the pelvis is outlined on the PET exam. Electronically Signed   By: Zetta Bills M.D.   On: 02/14/2021 13:38    ____________________________________________   PROCEDURES  Procedure(s) performed (including Critical Care):  .Critical Care  Date/Time: 02/14/2021 2:54 PM Performed by: Lucrezia Starch, MD Authorized by: Lucrezia Starch, MD   Critical care provider statement:    Critical care time (minutes):  45   Critical care was necessary to treat or prevent imminent or life-threatening deterioration of the following conditions:  Sepsis   Critical care was time spent personally by me on the following activities:  Discussions with consultants, evaluation of patient's response to treatment, examination of patient, ordering and performing treatments and interventions, ordering and review of laboratory studies, ordering and review of radiographic studies, pulse oximetry, re-evaluation of patient's condition, obtaining history from patient or surrogate and review of old charts   ____________________________________________   INITIAL IMPRESSION / [place] / ED COURSE      Patient presents with above to history exam for assessment of some worsening rectal pain and some Blood mixed with the stool today.  This in the setting of recently diagnosed rectal cancer.  On arrival he is afebrile and hemodynamically stable with a slight tachycardic.  I did review a PET scan he underwent yesterday that showed ballooning of the mesorectum extensive stranding inflammation concerning for possible rectal colitis and focal defect in the rectum at  the site of the tumor along the left posterior lateral aspect with some contained inflammation.  Rectal tumor also appeared bulkier compared to CT  obtained the week before.  Differential today includes possible progression of cancer and likely bleeding from fungating mass.  We will also check a CBC to see if patient needs blood transfusion suspect he may be a little tachycardic from his pain and being mildly dehydrated as well.  We will give her Percocet and IV fluids.  Given concerning findings on PET yesterday will obtain a CT as well to assess for any evidence of possible perforation or developing obstruction.  CMP shows no significant electrode or metabolic derangements.  CBC with WBC count of 50.1 compared to 24.26 days ago and hemoglobin 8.1 compared to 8.46 days ago.  Platelets are 659.  Lactic acid nonelevated 1.7.  Urinalysis does not appear infected with 150 glucose 30 protein and rare bacteria.  Given concern for possible perforation on CT surgery consulted.  Also concern the patient is septic from this and he did receive broad-spectrum antibiotics fluids and blood and urine cultures were ordered.  I discussed with Dr. Celine Ahr.  She recommends admission to medicine for IV antibiotics at this time.  No immediate surgical intervention at this time.  Patient admitted in stable condition.     ____________________________________________   FINAL CLINICAL IMPRESSION(S) / ED DIAGNOSES  Final diagnoses:  Malignant neoplasm of colon, unspecified part of colon ([place])  Sepsis, due to unspecified organism, unspecified whether acute organ dysfunction present [facility])  Colon perforation ([place])    Medications  lactated ringers infusion (has no administration in time range)  lactated ringers bolus 1,000 mL (0 mLs Intravenous Stopped 02/14/21 1435)  oxyCODONE-acetaminophen (PERCOCET/ROXICET) 5-325 MG per tablet 1 tablet (1 tablet Oral Given 02/14/21 1310)  piperacillin-tazobactam (ZOSYN) IVPB 3.375 g (0 g Intravenous Stopped 02/14/21 1435)     ED Discharge Orders     None        Note:  This document was prepared using Dragon voice  recognition software and may include unintentional dictation errors.    Lucrezia Starch, MD 02/14/21 1455

## 2021-02-14 NOTE — ED Notes (Signed)
337 ml of urine noted after post void residual, MD aware.

## 2021-02-14 NOTE — ED Triage Notes (Signed)
Diagnosed with cancer X2 weeks ago. Arrived by EMS from home for rectal bleeding. Described as bright and dark red. EMS administered 40mcg fentanyl.

## 2021-02-14 NOTE — Telephone Encounter (Signed)
Patient is not passing gas. Did not have pain per wife only feeling bloated

## 2021-02-14 NOTE — ED Notes (Signed)
Lab called to assist with BC's.

## 2021-02-14 NOTE — Progress Notes (Signed)
   02/14/21 1748  Assess: MEWS Score  Temp (!) 100.5 F (38.1 C)  BP 121/69  Pulse Rate (!) 106  Resp 20  SpO2 100 %  O2 Device Room Air  Assess: MEWS Score  MEWS Temp 1  MEWS Systolic 0  MEWS Pulse 1  MEWS RR 0  MEWS LOC 0  MEWS Score 2  MEWS Score Color Yellow  Assess: if the MEWS score is Yellow or Red  Were vital signs taken at a resting state? Yes  Focused Assessment No change from prior assessment  Early Detection of Sepsis Score *See Row Information* Medium  MEWS guidelines implemented *See Row Information* Yes  Treat  MEWS Interventions Escalated (See documentation below)  Pain Scale 0-10  Pain Score 8  Pain Type Acute pain  Take Vital Signs  Increase Vital Sign Frequency  Yellow: Q 2hr X 2 then Q 4hr X 2, if remains yellow, continue Q 4hrs  Escalate  MEWS: Escalate Yellow: discuss with charge nurse/RN and consider discussing with provider and RRT  Notify: Charge Nurse/RN  Name of Charge Nurse/RN Notified Ruthy Dick, RN  Date Charge Nurse/RN Notified 02/14/21  Time Charge Nurse/RN Notified 1800  Notify: Provider  Provider Name/Title Dr. Florina Ou  Date Provider Notified 02/14/21  Time Provider Notified 1800  Notification Type Page  Notification Reason Other (Comment) (Vitals temp elevated)  Provider response No new orders  Date of Provider Response 02/14/21  Time of Provider Response 1806  Document  Patient Outcome Stabilized after interventions  Progress note created (see row info) Yes

## 2021-02-14 NOTE — ED Notes (Signed)
Pt presents to ED with c/o of rectal bleeding and rectal pain. Pt states he was Dx'ed with cancer at his last visit here about 2 weeks ago. Pt states he started taking Norco but also states taking stool softeners as well. Pt states he is still passing flatus and is having small stools. Pt denies N/V at this time.

## 2021-02-14 NOTE — Progress Notes (Signed)
Per ED RN blood cultures drawn before antibiotics given

## 2021-02-14 NOTE — ED Notes (Signed)
Pt noted to have taken cardiac monitor and pt states "I don't want all these wires in case I need to use the restroom". Pt denies CP or SOB

## 2021-02-14 NOTE — Telephone Encounter (Signed)
Patient's wife called and stated that patient was now experiencing severe pain in his rectal area, and he feels as though mass is larger. She stated that she has called EMS to take him to the ED.

## 2021-02-14 NOTE — Telephone Encounter (Signed)
Called report  IMPRESSION: Signs of locally advanced rectal cancer with ballooning of the mesorectum now with low attenuation material that was not present on previous examinations (not present on 01/14/23 and much less pronounced on 02-12-2023) with extensive stranding and inflammation. Findings suspicious for rectal compromise/focal defect in the rectum at the site of tumor or along the LEFT posterolateral aspect and contained inflammation in the mesorectum. Rectal tumor also appears bulkier in general and part of this could be due to marked worsening in short interval of rectal tumor.   Large lymph node along the RIGHT pelvic sidewall and smaller lymph nodes as outlined above in the pelvis outside of the mesorectum including inguinal lymph nodes which are suspicious for disease. Note that the dominant lymph node displays little FDG uptake, potentially due to necrosis.   Lymph nodes in the groin are equivocal in the setting of extensive inflammation and potentially reactive showing enlargement since previous imaging from 01/14/23.   No signs of solid organ metastasis.   Diffuse marrow uptake likely related to symptomatic anemia.     Electronically Signed   By: Zetta Bills M.D.   On: 02/14/2021 08:58

## 2021-02-15 ENCOUNTER — Inpatient Hospital Stay: Payer: Self-pay

## 2021-02-15 DIAGNOSIS — R509 Fever, unspecified: Secondary | ICD-10-CM

## 2021-02-15 DIAGNOSIS — K668 Other specified disorders of peritoneum: Secondary | ICD-10-CM

## 2021-02-15 LAB — COMPREHENSIVE METABOLIC PANEL
ALT: 19 U/L (ref 0–44)
AST: 19 U/L (ref 15–41)
Albumin: 2.3 g/dL — ABNORMAL LOW (ref 3.5–5.0)
Alkaline Phosphatase: 85 U/L (ref 38–126)
Anion gap: 9 (ref 5–15)
BUN: 10 mg/dL (ref 6–20)
CO2: 26 mmol/L (ref 22–32)
Calcium: 8.2 mg/dL — ABNORMAL LOW (ref 8.9–10.3)
Chloride: 95 mmol/L — ABNORMAL LOW (ref 98–111)
Creatinine, Ser: 0.95 mg/dL (ref 0.61–1.24)
GFR, Estimated: >60 mL/min (ref >60)
Glucose, Bld: 124 mg/dL — ABNORMAL HIGH (ref 70–99)
Potassium: 4.4 mmol/L (ref 3.5–5.1)
Sodium: 130 mmol/L — ABNORMAL LOW (ref 135–145)
Total Bilirubin: 0.8 mg/dL (ref 0.3–1.2)
Total Protein: 7.8 g/dL (ref 6.5–8.1)

## 2021-02-15 LAB — CBC
HCT: 25.8 % — ABNORMAL LOW (ref 39.0–52.0)
Hemoglobin: 8.4 g/dL — ABNORMAL LOW (ref 13.0–17.0)
MCH: 22.1 pg — ABNORMAL LOW (ref 26.0–34.0)
MCHC: 32.6 g/dL (ref 30.0–36.0)
MCV: 67.9 fL — ABNORMAL LOW (ref 80.0–100.0)
Platelets: 590 10*3/uL — ABNORMAL HIGH (ref 150–400)
RBC: 3.8 MIL/uL — ABNORMAL LOW (ref 4.22–5.81)
RDW: 21.6 % — ABNORMAL HIGH (ref 11.5–15.5)
WBC: 43.4 10*3/uL — ABNORMAL HIGH (ref 4.0–10.5)
nRBC: 0.1 % (ref 0.0–0.2)

## 2021-02-15 LAB — LACTIC ACID, PLASMA: Lactic Acid, Venous: 1.7 mmol/L (ref 0.5–1.9)

## 2021-02-15 MED ORDER — MIDAZOLAM HCL 2 MG/2ML IJ SOLN
INTRAMUSCULAR | Status: AC
  Filled 2021-02-15: qty 2

## 2021-02-15 MED ORDER — FENTANYL CITRATE (PF) 100 MCG/2ML IJ SOLN
INTRAMUSCULAR | Status: AC | PRN
  Administered 2021-02-15 (×2): 50 ug via INTRAVENOUS

## 2021-02-15 MED ORDER — OXYCODONE-ACETAMINOPHEN 5-325 MG PO TABS
1.0000 | ORAL_TABLET | Freq: Once | ORAL | Status: AC
  Administered 2021-02-15: 1 via ORAL
  Filled 2021-02-15: qty 1

## 2021-02-15 MED ORDER — MIDAZOLAM HCL 2 MG/2ML IJ SOLN
INTRAMUSCULAR | Status: AC | PRN
  Administered 2021-02-15: 1 mg via INTRAVENOUS

## 2021-02-15 MED ORDER — MIDAZOLAM HCL 5 MG/5ML IJ SOLN
INTRAMUSCULAR | Status: AC | PRN
  Administered 2021-02-15: 1 mg via INTRAVENOUS

## 2021-02-15 MED ORDER — FENTANYL CITRATE (PF) 100 MCG/2ML IJ SOLN
INTRAMUSCULAR | Status: AC
  Filled 2021-02-15: qty 2

## 2021-02-15 NOTE — H&P (View-Only) (Signed)
[facility] CONSULT NOTE  Patient Care Team: Patient, No Pcp Per (Inactive) as PCP - General (General Practice) [name], RN as Oncology Nurse Navigator  After drainage/PURPOSE OF CONSULTATION:  Rectal cancer  HISTORY OF PRESENTING ILLNESS:  Barry [name]. 55 y.o.  male with recently diagnosed awaiting proceed with neo-adjuvant systemic therapy is currently admitted to hospital for rectal/perirectal pain.  Patient was diagnosed with rectal cancer approximately 2 weeks ago.  Patient had acute worsening of rectal/perirectal pain which led him to seek admission to the hospital.  CT scan showed perirectal fluid collection concerning for infection.  Patient also noted to have significant leukocytosis/anemia hemoglobin of 6.8.  Patient is currently on Zosyn; also s/p drainage of PRBC transfusion.  Patient has been evaluated by surgery; recommended IR guided drainage of the pus.  Patient noted to have significant improvement of his drainage of his rectal abscess.     Review of Systems  Constitutional:  Positive for malaise/fatigue. Negative for chills, diaphoresis, fever and weight loss.  HENT:  Negative for nosebleeds and sore throat.   Eyes:  Negative for double vision.  Respiratory:  Negative for cough, hemoptysis, sputum production, shortness of breath and wheezing.   Cardiovascular:  Negative for chest pain, palpitations, orthopnea and leg swelling.  Gastrointestinal:  Positive for blood in stool and constipation. Negative for abdominal pain, diarrhea, heartburn, melena, nausea and vomiting.       Rectal pain.   Genitourinary:  Negative for dysuria, frequency and urgency.  Musculoskeletal:  Negative for back pain and joint pain.  Skin: Negative.  Negative for itching and rash.  Neurological:  Negative for dizziness, tingling, focal weakness, weakness and headaches.  Endo/Heme/Allergies:  Does not bruise/bleed easily.  Psychiatric/Behavioral:  Negative for  depression. The patient is not nervous/anxious and does not have insomnia.     MEDICAL HISTORY:  Past Medical History:  Diagnosis Date   Headache    Left shoulder pain    Rectal cancer ([place])     SURGICAL HISTORY: Past Surgical History:  Procedure Laterality Date   COLONOSCOPY WITH PROPOFOL N/A 02/06/2021   Procedure: COLONOSCOPY WITH PROPOFOL;  Surgeon: Lin Landsman, MD;  Location: [facility] ENDOSCOPY;  Service: Gastroenterology;  Laterality: N/A;   ROTATOR CUFF REPAIR Left     SOCIAL HISTORY: Social History   Socioeconomic History   Marital status: Married    Spouse name: Not on file   Number of children: Not on file   Years of education: Not on file   Highest education level: Not on file  Occupational History   Not on file  Tobacco Use   Smoking status: Former    Packs/day: 1.00    Years: 10.00    Pack years: 10.00    Types: Cigars, Cigarettes    Quit date: 02/04/2021    Years since quitting: 0.0   Smokeless tobacco: Never  Substance and Sexual Activity   Alcohol use: Not Currently   Drug use: No   Sexual activity: Not on file  Other Topics Concern   Not on file  Social History Narrative   Not on file   Social Determinants of Health   Financial Resource Strain: Not on file  Food Insecurity: Not on file  Transportation Needs: Not on file  Physical Activity: Not on file  Stress: Not on file  Social Connections: Not on file  Intimate Partner Violence: Not on file    FAMILY HISTORY: Family History  Problem Relation Age of Onset  Cancer Sister    Diabetes Mother    Cancer Maternal Grandmother    Cancer Paternal Grandmother     ALLERGIES:  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  MEDICATIONS:  Current Facility-Administered Medications  Medication Dose Route Frequency Provider Last Rate Last Admin   0.9 %  sodium chloride infusion  250 mL Intravenous PRN Para Skeans, MD 10 mL/hr at 02/16/21 0025 250 mL at 02/16/21 0025   acetaminophen (TYLENOL) tablet  650 mg  650 mg Oral Q4H PRN Para Skeans, MD   650 mg at 02/16/21 0008   Or   acetaminophen (TYLENOL) suppository 650 mg  650 mg Rectal Q4H PRN Para Skeans, MD       nicotine (NICODERM CQ - dosed in mg/24 hours) patch 21 mg  21 mg Transdermal Daily Florina Ou V, MD   21 mg at 02/15/21 0945   ondansetron (ZOFRAN) tablet 4 mg  4 mg Oral Q6H PRN Para Skeans, MD       Or   ondansetron (ZOFRAN) injection 4 mg  4 mg Intravenous Q6H PRN Para Skeans, MD       pantoprazole (PROTONIX) injection 40 mg  40 mg Intravenous Daily Florina Ou V, MD   40 mg at 02/15/21 0947   piperacillin-tazobactam (ZOSYN) IVPB 3.375 g  3.375 g Intravenous Q8H Judd Gaudier V, MD 12.5 mL/hr at 02/16/21 0027 3.375 g at 02/16/21 0027   sodium chloride flush (NS) 0.9 % injection 3 mL  3 mL Intravenous Q12H Florina Ou V, MD   3 mL at 02/15/21 2027   sodium chloride flush (NS) 0.9 % injection 3 mL  3 mL Intravenous PRN Para Skeans, MD          .  PHYSICAL EXAMINATION:  Vitals:   02/15/21 1636 02/15/21 2137  BP: 117/60 108/63  Pulse: (!) 109 94  Resp: 18 20  Temp: 99.3 F (37.4 C) 99.3 F (37.4 C)  SpO2: 96% 100%   Filed Weights   02/14/21 1206  Weight: 149 lb (67.6 kg)    Physical Exam Constitutional:      Comments: Ambulating independently/assistive devices.  Alone/family  HENT:     Head: Normocephalic and atraumatic.     Mouth/Throat:     Pharynx: No oropharyngeal exudate.  Eyes:     Pupils: Pupils are equal, round, and reactive to light.  Cardiovascular:     Rate and Rhythm: Normal rate and regular rhythm.  Pulmonary:     Effort: Pulmonary effort is normal. No respiratory distress.     Breath sounds: Normal breath sounds. No wheezing.     Comments: Decreased breath sounds bilaterally at bases.  No wheeze or crackles Abdominal:     General: Bowel sounds are normal. There is no distension.     Palpations: Abdomen is soft. There is no mass.     Tenderness: There is no abdominal tenderness.  There is no guarding or rebound.  Genitourinary:    Comments: Rectal exam deferred.  Musculoskeletal:        General: No tenderness. Normal range of motion.     Cervical back: Normal range of motion and neck supple.  Skin:    General: Skin is warm.  Neurological:     Mental Status: He is alert and oriented to person, place, and time.  Psychiatric:        Mood and Affect: Affect normal.     LABORATORY DATA:  I have reviewed the data as listed  Lab Results  Component Value Date   WBC 43.4 (H) 02/15/2021   HGB 8.4 (L) 02/15/2021   HCT 25.8 (L) 02/15/2021   MCV 67.9 (L) 02/15/2021   PLT 590 (H) 02/15/2021   Recent Labs    02/05/21 1309 02/14/21 1210 02/15/21 0429  NA 134* 132* 130*  K 4.1 4.7 4.4  CL 99 96* 95*  CO2 24 27 26   GLUCOSE 89 115* 124*  BUN 11 13 10   CREATININE 1.14 1.01 0.95  CALCIUM 8.5* 8.4* 8.2*  GFRNONAA >60 >60 >60  PROT 8.3* 8.7* 7.8  ALBUMIN 3.2* 2.5* 2.3*  AST 14* 17 19  ALT 13 20 19   ALKPHOS 79 97 85  BILITOT 0.5 0.5 0.8    RADIOGRAPHIC STUDIES: I have personally reviewed the radiological images as listed and agreed with the findings in the report. CT ABDOMEN PELVIS WO CONTRAST  Addendum Date: 02/15/2021   ADDENDUM REPORT: 02/15/2021 10:27 ADDENDUM: Signs of potential fishbone in the colon well upstream from the rectal mass. Suggest attention on follow-up as these can occasionally lead to bowel perforation, this is an unrelated finding and not currently associated with inflammation. (Image 82/6 and image 25/5) These results will be called to the ordering clinician or representative by the Radiologist Assistant, and communication documented in the PACS or [employer]. Electronically Signed   By: Zetta Bills M.D.   On: 02/15/2021 10:27   Result Date: 02/15/2021 CLINICAL DATA:  New rectal cancer with severe pain. EXAM: CT ABDOMEN AND PELVIS WITHOUT CONTRAST TECHNIQUE: Multidetector CT imaging of the abdomen and pelvis was performed following  the standard protocol without IV contrast. COMPARISON:  PET CT that was performed on February 13, 2021. FINDINGS: Lower chest: Incidental imaging of the lung bases is unremarkable. Hepatobiliary: Small low-density lesion in the RIGHT hepatic lobe (image 9/2 6 mm, likely a small cyst. Small cyst along the RIGHT hepatic margin. No pericholecystic stranding. No gross biliary duct distension. Pancreas: Pancreas with mild atrophy. No sign of adjacent inflammation. Spleen: Spleen normal size and contour. Adrenals/Urinary Tract: Adrenal glands are normal. Symmetric smooth contour of bilateral kidneys. No hydronephrosis. No nephrolithiasis. Urinary bladder with smooth contours but extending into the lower abdomen with moderate to marked distension. Stomach/Bowel: Stomach without evidence of adjacent stranding. No small bowel dilation. Normal appendix. Colon is stool filled without signs of obstruction. At the descending/sigmoid junction there is a linear area of increased calcific density within the fecal stream potentially a small fishbone. No adjacent stranding or free air. Distal to this is the large irregular colonic tumor with increasing size of a horseshoe shaped area of low attenuation about the rectum. Small fluid collection lateral to the pubic 0 wreck talus muscle on image 82 of series 2 contains gas and has developed in the short interval since the study of Feb 05, 2021, quite subtle on the PET scan of only 1 day ago now containing gas, not containing gas on the recent PET exam. Small amount a of gas also seen in the fluid density that tracks into the perianal region, potentially within the sphincter complex in the intersphincteric plane though not clearly delineated on the current study bulging normal anatomy along the region of the sphincter complex. Ulcerated and irregular circumferential mass. Fluid begins above the pelvic floor and tracks below the pelvic floor along with LEFT ischial rectal fossa collection  described above. Associated nodal enlargement in the RIGHT hemipelvis as as outlined on the previous PET exam along with hypogastric lymph nodes  and extensive mesorectal soft tissue. Inguinal lymph nodes with enlargement, potentially reactive given above findings. No free intraperitoneal air. Vascular/Lymphatic: No retroperitoneal adenopathy. Calcified atheromatous plaque of the abdominal aorta. No aneurysmal dilation. Reproductive: Inseparable from the process in the mesorectum. Other: No free air.  No ascites. Musculoskeletal: No acute bone finding or destructive bone process. Spinal degenerative changes. IMPRESSION: Signs of presumed perforated rectal mass with contained perforation with fluid and gas extending above and below the pelvic floor, potentially involving the sphincter complex and extending into LEFT ischial rectal fossa. GI and or surgical consultation may be helpful for further evaluation. Fluid and gas has developed since the Feb 06, 2020 evaluation where there was only minimal low-attenuation outside of the rectum. Signs of nodal disease in the pelvis is outlined on the PET exam. Electronically Signed: By: Zetta Bills M.D. On: 02/14/2021 13:38   CT ABDOMEN PELVIS WO CONTRAST  Result Date: 02/05/2021 CLINICAL DATA:  Elevated white blood count. EXAM: CT ABDOMEN AND PELVIS WITHOUT CONTRAST TECHNIQUE: Multidetector CT imaging of the abdomen and pelvis was performed following the standard protocol without IV contrast. COMPARISON:  Jan 08, 2020. FINDINGS: Lower chest: No acute abnormality. Hepatobiliary: No gallstones or biliary dilatation is noted. Grossly stable 1 cm low density is noted along inferior portion of right hepatic lobe. Stable probable small cyst is seen posteriorly in the right hepatic lobe. Enhancing abnormality seen on prior exam in left hepatic lobe is not visualized currently due to the lack of intravenous contrast administration. Pancreas: Unremarkable. No pancreatic ductal  dilatation or surrounding inflammatory changes. Spleen: Normal in size without focal abnormality. Adrenals/Urinary Tract: Adrenal glands are unremarkable. Kidneys are normal, without renal calculi, focal lesion, or hydronephrosis. Bladder is unremarkable. Stomach/Bowel: The stomach appears normal. The appendix appears normal. There is no evidence of bowel obstruction. There appears to be significantly increased wall thickening involving the rectum with a significantly increased amount of inflammatory change in the perirectal soft tissues. This is concerning for rectal malignancy or possibly severe proctitis. Stable linear high density is seen adjacent to sigmoid colon concerning for ingested bone fragment. Vascular/Lymphatic: Aortic atherosclerosis. 3.1 x 2.3 cm right internal iliac lymph node is noted which is new since prior exam and highly concerning for metastatic disease. Reproductive: Prostate is unremarkable. Other: No abdominal wall hernia or abnormality. No abdominopelvic ascites. Musculoskeletal: No acute or significant osseous findings. IMPRESSION: Severe rectal wall thickening is noted with surrounding inflammation which is significantly worse compared to prior exam. There also appears to be a 3.1 x 2.3 cm right internal iliac lymph node which is concerning for metastatic disease. These findings are concerning for rectal malignancy or severe proctitis. Sigmoidoscopy is recommended. Enhancing abnormality seen in left hepatic lobe on prior exam is not visualized currently due to lack of intravenous contrast. Electronically Signed   By: Marijo Conception M.D.   On: 02/05/2021 11:30   NM PET Image Initial (PI) Skull Base To Thigh  Addendum Date: 02/14/2021   ADDENDUM REPORT: 02/14/2021 13:43 ADDENDUM: For clarification the comparison from May 2nd is from 2021. Nodal disease that is present on the current study and the most recent comparison is not visible on the previous more remote CT assessment. Nor is  the fluid density in the area of the mesorectum that tracks above and below the pelvic floor as described. Additionally, there is a spiculated nodule in the LEFT upper lobe with morphologic features that raise the question of small bronchogenic neoplasm measuring 9 x 8 mm (  8 x 8 mm in the initial report). Referral to multi disciplinary thoracic oncologic setting may be helpful for further assessment. In the current context could consider short interval follow-up or biopsy for further assessment as warranted. These results will be called to the ordering clinician or representative by the Radiologist Assistant, and communication documented in the PACS or [employer]. Electronically Signed   By: Zetta Bills M.D.   On: 02/14/2021 13:43   Addendum Date: 02/14/2021   ADDENDUM REPORT: 02/14/2021 13:21 ADDENDUM: These results were called to the ordering clinician or representative by the Radiologist Assistant, and communication documented in the PACS or [employer]. Electronically Signed   By: Zetta Bills M.D.   On: 02/14/2021 13:21   Result Date: 02/14/2021 CLINICAL DATA:  Initial treatment strategy for rectal cancer in a 55 year old male. Patient reports severe pain in symptoms that made the examination challenging. EXAM: NUCLEAR MEDICINE PET SKULL BASE TO THIGH TECHNIQUE: 7.97 mCi F-18 FDG was injected intravenously. Full-ring PET imaging was performed from the skull base to thigh after the radiotracer. CT data was obtained and used for attenuation correction and anatomic localization. Fasting blood glucose: 107 mg/dl COMPARISON:  CT of the abdomen and pelvis Feb 05, 2021 and CT of the abdomen and pelvis Jan 07, 2021. FINDINGS: Mediastinal blood pool activity: SUV max 2.05 Liver activity: SUV max NA NECK: Asymmetric muscular activity in the neck in prevertebral musculature. LEFT over RIGHT without visible lesion. Incidental CT findings: none CHEST: Spiculated nodule in the LEFT upper lobe measures  approximately 8 x 8 mm with some surrounding spiculation (image 84/1) metabolic activity not elevated above mediastinal blood pool. Signs of pulmonary emphysema. Airways are patent. No adenopathy in the chest or signs of suspicious nodule elsewhere. Incidental CT findings: Calcified atherosclerotic plaque of the thoracic aorta. 4 cm caliber of the ascending thoracic aorta. Minimal scattered atherosclerotic plaque with calcification. Heart size normal without pericardial effusion. Normal caliber central pulmonary vessels. Normal appearance of the esophagus. ABDOMEN/PELVIS: No signs of solid organ metastasis. Cyst in the RIGHT hepatic lobe. Marked hypermetabolic activity about the patient's known rectal mass. This mass appears less well-defined. The mesorectum is filled with low attenuation to the LEFT posterolateral aspect of the mesorectum with crescentic appearance tracking into the upper sphincter complex, horseshoe shaped low attenuation surrounds the rectum and anus as it extends towards the pelvic floor. Mass measuring approximately 6.5 x 4.9 cm (image 248/3) maximum SUV 17.6. Lenticular area of low attenuation with some peripheral increased metabolic activity, more nodular along the LEFT posterolateral margin on image 257 of series 3. This area measures approximately 2.7 cm in greatest thickness and surrounds the rectum and anus approximately 270 degrees of its circumference above the pelvic floor and at the pelvic floor slightly less. Bulky RIGHT pelvic sidewall/hypogastric lymph node outside of the mesorectum with stippled calcification measuring 19 mm and without signs of increased metabolic activity. Small LEFT hypogastric lymph node just peripheral to the internal external bifurcation on image 228 of series 3 displays low level FDG uptake with a maximum SUV of 3.2. High RIGHT internal just below or at the internal/common iliac transition, lymph node (image 214/3) 12 mm maximum SUV of 4.8. LEFT high  hypogastric lymph node (image 217/3) 8 mm with low level FDG uptake, maximum SUV of 3.0. Scattered lymph nodes throughout the retroperitoneum with FDG uptake only slightly above mediastinal blood pool. For instance on image 181 of series 3 is an 8 mm lymph node with a maximum  SUV of 2.2 along the LEFT periaortic chain. Bilateral inguinal lymph nodes largest on the RIGHT (image 248/3) 11 mm with a maximum SUV of 2.8 Primary tumor difficult to assess in terms of margins due to surrounding stranding which has worsened since previous imaging. Incidental CT findings: Tiny hypodensity in the RIGHT hemi liver (image 138/3) no focal increased FDG uptake though this 4 mm area may be below PET resolution in this context. SKELETON: Diffuse increased metabolic activity throughout the visualized skeleton without focal area of activity to suggest bony metastasis. Incidental CT findings: none IMPRESSION: Signs of locally advanced rectal cancer with ballooning of the mesorectum now with low attenuation material that was not present on previous examinations (not present on 2023-01-29 and much less pronounced on Feb 27, 2023) with extensive stranding and inflammation. Findings suspicious for rectal compromise/focal defect in the rectum at the site of tumor or along the LEFT posterolateral aspect and contained inflammation in the mesorectum. Rectal tumor also appears bulkier in general and part of this could be due to marked worsening in short interval of rectal tumor. Large lymph node along the RIGHT pelvic sidewall and smaller lymph nodes as outlined above in the pelvis outside of the mesorectum including inguinal lymph nodes which are suspicious for disease. Note that the dominant lymph node displays little FDG uptake, potentially due to necrosis. Lymph nodes in the groin are equivocal in the setting of extensive inflammation and potentially reactive showing enlargement since previous imaging from 01/29/2023. No signs of solid organ  metastasis. Diffuse marrow uptake likely related to symptomatic anemia. Electronically Signed: By: Zetta Bills M.D. On: 02/14/2021 08:58   CT IMAGE GUIDED DRAINAGE BY PERCUTANEOUS CATHETER  Result Date: 02/15/2021 INDICATION: 55 year old gentleman with perirectal abscess presents to IR for CT-guided drain placement EXAM: CT GUIDED DRAINAGE OF PERIRECTAL ABSCESS MEDICATIONS: The patient is currently admitted to the hospital and receiving intravenous antibiotics. The antibiotics were administered within an appropriate time frame prior to the initiation of the procedure. ANESTHESIA/SEDATION: Two mg IV Versed 100 mcg IV Fentanyl Moderate Sedation Time:  10 minutes The patient was continuously monitored during the procedure by the interventional radiology nurse under my direct supervision. COMPLICATIONS: None immediate. TECHNIQUE: Informed written consent was obtained from the patient after a thorough discussion of the procedural risks, benefits and alternatives. All questions were addressed. Maximal Sterile Barrier Technique was utilized including caps, mask, sterile gowns, sterile gloves, sterile drape, hand hygiene and skin antiseptic. A timeout was performed prior to the initiation of the procedure. PROCEDURE: Patient position prone on the procedure table. The posterior pelvic wall skin prepped and draped in usual fashion. Following local lidocaine administration, 18 gauge needle advanced into the perirectal fluid collection utilizing CT guidance. 18 gauge needle exchanged for 10 [place] multipurpose pigtail drain over 0.035 inch guidewire. 50 mL purulent sample aspirated and sent for Gram stain and culture. Drain secured to skin with suture and connected to bulb suction. Post placement CT confirmed appropriate placement of the drain. IMPRESSION: 10 [place] multipurpose pigtail drain placed in perirectal abscess. Electronically Signed   By: Miachel Roux M.D.   On: 02/15/2021 15:24    Rectal cancer  [facility]) #55 year old male patient with history of newly diagnosed rectal cancer is currently in the hospital for worsening abdominal pain  #Rectal cancer at least locally advanced given the positive pelvic lymphadenopathy CT scan.  Patient still awaiting further staging work-up.  #Perirectal abscess/leukemoid reaction-s/p drainage; with significant improvement of symptoms.  Continue antibiotics.  Recommendations:  #  Recommend avoiding MRI abdomen given rectal abscess at this time.  Await resolution of acute issues before imaging.  Also discussed the rationale for holding chemotherapy given his acute infectious issues.  Abnormality was also discussed with patient's wife/patient in agreement.  #Also recommend holding Mediport placement as planned for [6/13]-given his acute issues. In agreement.   Thank you Dr.Patel  for allowing me to participate in the care of your pleasant patient. Please do not hesitate to contact me with questions or concerns in the interim.  # I reviewed the blood work- with the patient in detail; also reviewed the imaging independently [as summarized above]; and with the patient in detail.   All questions were answered. The patient knows to call the clinic with any problems, questions or concerns.    Cammie Sickle, MD 02/16/2021 1:43 AM

## 2021-02-15 NOTE — Consult Note (Signed)
[place] CONSULT NOTE  Barry [name] Care Team: Barry [name], No Pcp Per (Inactive) as PCP - General (General Practice) [name], RN as Oncology Nurse Navigator  After drainage/PURPOSE OF CONSULTATION:  Rectal cancer  HISTORY OF PRESENTING ILLNESS:  Barry [name]. 55 y.o.  male with recently diagnosed awaiting proceed with neo-adjuvant systemic therapy is currently admitted to hospital for rectal/perirectal pain.  Barry [name] was diagnosed with rectal cancer approximately 2 weeks ago.  Barry [name] had acute worsening of rectal/perirectal pain which led him to seek admission to the hospital.  CT scan showed perirectal fluid collection concerning for infection.  Barry [name] also noted to have significant leukocytosis/anemia hemoglobin of 6.8.  Barry [name] is currently on Zosyn; also s/p drainage of PRBC transfusion.  Barry [name] has been evaluated by surgery; recommended IR guided drainage of the pus.  Barry [name] noted to have significant improvement of his drainage of his rectal abscess.     Review of Systems  Constitutional:  Positive for malaise/fatigue. Negative for chills, diaphoresis, fever and weight loss.  HENT:  Negative for nosebleeds and sore throat.   Eyes:  Negative for double vision.  Respiratory:  Negative for cough, hemoptysis, sputum production, shortness of breath and wheezing.   Cardiovascular:  Negative for chest pain, palpitations, orthopnea and leg swelling.  Gastrointestinal:  Positive for blood in stool and constipation. Negative for abdominal pain, diarrhea, heartburn, melena, nausea and vomiting.       Rectal pain.   Genitourinary:  Negative for dysuria, frequency and urgency.  Musculoskeletal:  Negative for back pain and joint pain.  Skin: Negative.  Negative for itching and rash.  Neurological:  Negative for dizziness, tingling, focal weakness, weakness and headaches.  Endo/Heme/Allergies:  Does not bruise/bleed easily.  Psychiatric/Behavioral:  Negative for  depression. The Barry [name] is not nervous/anxious and does not have insomnia.     MEDICAL HISTORY:  Past Medical History:  Diagnosis Date   Headache    Left shoulder pain    Rectal cancer ([place])     SURGICAL HISTORY: Past Surgical History:  Procedure Laterality Date   COLONOSCOPY WITH PROPOFOL N/A 02/06/2021   Procedure: COLONOSCOPY WITH PROPOFOL;  Surgeon: Lin Landsman, MD;  Location: [facility] ENDOSCOPY;  Service: Gastroenterology;  Laterality: N/A;   ROTATOR CUFF REPAIR Left     SOCIAL HISTORY: Social History   Socioeconomic History   Marital status: Married    Spouse name: Not on file   Number of children: Not on file   Years of education: Not on file   Highest education level: Not on file  Occupational History   Not on file  Tobacco Use   Smoking status: Former    Packs/day: 1.00    Years: 10.00    Pack years: 10.00    Types: Cigars, Cigarettes    Quit date: 02/04/2021    Years since quitting: 0.0   Smokeless tobacco: Never  Substance and Sexual Activity   Alcohol use: Not Currently   Drug use: No   Sexual activity: Not on file  Other Topics Concern   Not on file  Social History Narrative   Not on file   Social Determinants of Health   Financial Resource Strain: Not on file  Food Insecurity: Not on file  Transportation Needs: Not on file  Physical Activity: Not on file  Stress: Not on file  Social Connections: Not on file  Intimate Partner Violence: Not on file    FAMILY HISTORY: Family History  Problem Relation Age of Onset  Cancer Sister    Diabetes Mother    Cancer Maternal Grandmother    Cancer Paternal Grandmother     ALLERGIES:  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  MEDICATIONS:  Current Facility-Administered Medications  Medication Dose Route Frequency Provider Last Rate Last Admin   0.9 %  sodium chloride infusion  250 mL Intravenous PRN Para Skeans, MD 10 mL/hr at 02/16/21 0025 250 mL at 02/16/21 0025   acetaminophen (TYLENOL) tablet  650 mg  650 mg Oral Q4H PRN Para Skeans, MD   650 mg at 02/16/21 0008   Or   acetaminophen (TYLENOL) suppository 650 mg  650 mg Rectal Q4H PRN Para Skeans, MD       nicotine (NICODERM CQ - dosed in mg/24 hours) patch 21 mg  21 mg Transdermal Daily Florina Ou V, MD   21 mg at 02/15/21 0945   ondansetron (ZOFRAN) tablet 4 mg  4 mg Oral Q6H PRN Para Skeans, MD       Or   ondansetron (ZOFRAN) injection 4 mg  4 mg Intravenous Q6H PRN Para Skeans, MD       pantoprazole (PROTONIX) injection 40 mg  40 mg Intravenous Daily Florina Ou V, MD   40 mg at 02/15/21 0947   piperacillin-tazobactam (ZOSYN) IVPB 3.375 g  3.375 g Intravenous Q8H Judd Gaudier V, MD 12.5 mL/hr at 02/16/21 0027 3.375 g at 02/16/21 0027   sodium chloride flush (NS) 0.9 % injection 3 mL  3 mL Intravenous Q12H Florina Ou V, MD   3 mL at 02/15/21 2027   sodium chloride flush (NS) 0.9 % injection 3 mL  3 mL Intravenous PRN Para Skeans, MD          .  PHYSICAL EXAMINATION:  Vitals:   02/15/21 1636 02/15/21 2137  BP: 117/60 108/63  Pulse: (!) 109 94  Resp: 18 20  Temp: 99.3 F (37.4 C) 99.3 F (37.4 C)  SpO2: 96% 100%   Filed Weights   02/14/21 1206  Weight: 149 lb (67.6 kg)    Physical Exam Constitutional:      Comments: Ambulating independently/assistive devices.  Alone/family  HENT:     Head: Normocephalic and atraumatic.     Mouth/Throat:     Pharynx: No oropharyngeal exudate.  Eyes:     Pupils: Pupils are equal, round, and reactive to light.  Cardiovascular:     Rate and Rhythm: Normal rate and regular rhythm.  Pulmonary:     Effort: Pulmonary effort is normal. No respiratory distress.     Breath sounds: Normal breath sounds. No wheezing.     Comments: Decreased breath sounds bilaterally at bases.  No wheeze or crackles Abdominal:     General: Bowel sounds are normal. There is no distension.     Palpations: Abdomen is soft. There is no mass.     Tenderness: There is no abdominal tenderness.  There is no guarding or rebound.  Genitourinary:    Comments: Rectal exam deferred.  Musculoskeletal:        General: No tenderness. Normal range of motion.     Cervical back: Normal range of motion and neck supple.  Skin:    General: Skin is warm.  Neurological:     Mental Status: He is alert and oriented to person, place, and time.  Psychiatric:        Mood and Affect: Affect normal.     LABORATORY DATA:  I have reviewed the data as listed  Lab Results  Component Value Date   WBC 43.4 (H) 02/15/2021   HGB 8.4 (L) 02/15/2021   HCT 25.8 (L) 02/15/2021   MCV 67.9 (L) 02/15/2021   PLT 590 (H) 02/15/2021   Recent Labs    02/05/21 1309 02/14/21 1210 02/15/21 0429  NA 134* 132* 130*  K 4.1 4.7 4.4  CL 99 96* 95*  CO2 24 27 26   GLUCOSE 89 115* 124*  BUN 11 13 10   CREATININE 1.14 1.01 0.95  CALCIUM 8.5* 8.4* 8.2*  GFRNONAA >60 >60 >60  PROT 8.3* 8.7* 7.8  ALBUMIN 3.2* 2.5* 2.3*  AST 14* 17 19  ALT 13 20 19   ALKPHOS 79 97 85  BILITOT 0.5 0.5 0.8    RADIOGRAPHIC STUDIES: I have personally reviewed the radiological images as listed and agreed with the findings in the report. CT ABDOMEN PELVIS WO CONTRAST  Addendum Date: 02/15/2021   ADDENDUM REPORT: 02/15/2021 10:27 ADDENDUM: Signs of potential fishbone in the colon well upstream from the rectal mass. Suggest attention on follow-up as these can occasionally lead to bowel perforation, this is an unrelated finding and not currently associated with inflammation. (Image 82/6 and image 25/5) These results will be called to the ordering clinician or representative by the Radiologist Assistant, and communication documented in the PACS or [employer]. Electronically Signed   By: Zetta Bills M.D.   On: 02/15/2021 10:27   Result Date: 02/15/2021 CLINICAL DATA:  New rectal cancer with severe pain. EXAM: CT ABDOMEN AND PELVIS WITHOUT CONTRAST TECHNIQUE: Multidetector CT imaging of the abdomen and pelvis was performed following  the standard protocol without IV contrast. COMPARISON:  PET CT that was performed on February 13, 2021. FINDINGS: Lower chest: Incidental imaging of the lung bases is unremarkable. Hepatobiliary: Small low-density lesion in the RIGHT hepatic lobe (image 9/2 6 mm, likely a small cyst. Small cyst along the RIGHT hepatic margin. No pericholecystic stranding. No gross biliary duct distension. Pancreas: Pancreas with mild atrophy. No sign of adjacent inflammation. Spleen: Spleen normal size and contour. Adrenals/Urinary Tract: Adrenal glands are normal. Symmetric smooth contour of bilateral kidneys. No hydronephrosis. No nephrolithiasis. Urinary bladder with smooth contours but extending into the lower abdomen with moderate to marked distension. Stomach/Bowel: Stomach without evidence of adjacent stranding. No small bowel dilation. Normal appendix. Colon is stool filled without signs of obstruction. At the descending/sigmoid junction there is a linear area of increased calcific density within the fecal stream potentially a small fishbone. No adjacent stranding or free air. Distal to this is the large irregular colonic tumor with increasing size of a horseshoe shaped area of low attenuation about the rectum. Small fluid collection lateral to the pubic 0 wreck talus muscle on image 82 of series 2 contains gas and has developed in the short interval since the study of Feb 05, 2021, quite subtle on the PET scan of only 1 day ago now containing gas, not containing gas on the recent PET exam. Small amount a of gas also seen in the fluid density that tracks into the perianal region, potentially within the sphincter complex in the intersphincteric plane though not clearly delineated on the current study bulging normal anatomy along the region of the sphincter complex. Ulcerated and irregular circumferential mass. Fluid begins above the pelvic floor and tracks below the pelvic floor along with LEFT ischial rectal fossa collection  described above. Associated nodal enlargement in the RIGHT hemipelvis as as outlined on the previous PET exam along with hypogastric lymph nodes  and extensive mesorectal soft tissue. Inguinal lymph nodes with enlargement, potentially reactive given above findings. No free intraperitoneal air. Vascular/Lymphatic: No retroperitoneal adenopathy. Calcified atheromatous plaque of the abdominal aorta. No aneurysmal dilation. Reproductive: Inseparable from the process in the mesorectum. Other: No free air.  No ascites. Musculoskeletal: No acute bone finding or destructive bone process. Spinal degenerative changes. IMPRESSION: Signs of presumed perforated rectal mass with contained perforation with fluid and gas extending above and below the pelvic floor, potentially involving the sphincter complex and extending into LEFT ischial rectal fossa. GI and or surgical consultation may be helpful for further evaluation. Fluid and gas has developed since the Feb 06, 2020 evaluation where there was only minimal low-attenuation outside of the rectum. Signs of nodal disease in the pelvis is outlined on the PET exam. Electronically Signed: By: Zetta Bills M.D. On: 02/14/2021 13:38   CT ABDOMEN PELVIS WO CONTRAST  Result Date: 02/05/2021 CLINICAL DATA:  Elevated white blood count. EXAM: CT ABDOMEN AND PELVIS WITHOUT CONTRAST TECHNIQUE: Multidetector CT imaging of the abdomen and pelvis was performed following the standard protocol without IV contrast. COMPARISON:  Jan 08, 2020. FINDINGS: Lower chest: No acute abnormality. Hepatobiliary: No gallstones or biliary dilatation is noted. Grossly stable 1 cm low density is noted along inferior portion of right hepatic lobe. Stable probable small cyst is seen posteriorly in the right hepatic lobe. Enhancing abnormality seen on prior exam in left hepatic lobe is not visualized currently due to the lack of intravenous contrast administration. Pancreas: Unremarkable. No pancreatic ductal  dilatation or surrounding inflammatory changes. Spleen: Normal in size without focal abnormality. Adrenals/Urinary Tract: Adrenal glands are unremarkable. Kidneys are normal, without renal calculi, focal lesion, or hydronephrosis. Bladder is unremarkable. Stomach/Bowel: The stomach appears normal. The appendix appears normal. There is no evidence of bowel obstruction. There appears to be significantly increased wall thickening involving the rectum with a significantly increased amount of inflammatory change in the perirectal soft tissues. This is concerning for rectal malignancy or possibly severe proctitis. Stable linear high density is seen adjacent to sigmoid colon concerning for ingested bone fragment. Vascular/Lymphatic: Aortic atherosclerosis. 3.1 x 2.3 cm right internal iliac lymph node is noted which is new since prior exam and highly concerning for metastatic disease. Reproductive: Prostate is unremarkable. Other: No abdominal wall hernia or abnormality. No abdominopelvic ascites. Musculoskeletal: No acute or significant osseous findings. IMPRESSION: Severe rectal wall thickening is noted with surrounding inflammation which is significantly worse compared to prior exam. There also appears to be a 3.1 x 2.3 cm right internal iliac lymph node which is concerning for metastatic disease. These findings are concerning for rectal malignancy or severe proctitis. Sigmoidoscopy is recommended. Enhancing abnormality seen in left hepatic lobe on prior exam is not visualized currently due to lack of intravenous contrast. Electronically Signed   By: Marijo Conception M.D.   On: 02/05/2021 11:30   NM PET Image Initial (PI) Skull Base To Thigh  Addendum Date: 02/14/2021   ADDENDUM REPORT: 02/14/2021 13:43 ADDENDUM: For clarification the comparison from May 2nd is from 2021. Nodal disease that is present on the current study and the most recent comparison is not visible on the previous more remote CT assessment. Nor is  the fluid density in the area of the mesorectum that tracks above and below the pelvic floor as described. Additionally, there is a spiculated nodule in the LEFT upper lobe with morphologic features that raise the question of small bronchogenic neoplasm measuring 9 x 8 mm (  8 x 8 mm in the initial report). Referral to multi disciplinary thoracic oncologic setting may be helpful for further assessment. In the current context could consider short interval follow-up or biopsy for further assessment as warranted. These results will be called to the ordering clinician or representative by the Radiologist Assistant, and communication documented in the PACS or [employer]. Electronically Signed   By: Zetta Bills M.D.   On: 02/14/2021 13:43   Addendum Date: 02/14/2021   ADDENDUM REPORT: 02/14/2021 13:21 ADDENDUM: These results were called to the ordering clinician or representative by the Radiologist Assistant, and communication documented in the PACS or [employer]. Electronically Signed   By: Zetta Bills M.D.   On: 02/14/2021 13:21   Result Date: 02/14/2021 CLINICAL DATA:  Initial treatment strategy for rectal cancer in a 55 year old male. Barry [name] reports severe pain in symptoms that made the examination challenging. EXAM: NUCLEAR MEDICINE PET SKULL BASE TO THIGH TECHNIQUE: 7.97 mCi F-18 FDG was injected intravenously. Full-ring PET imaging was performed from the skull base to thigh after the radiotracer. CT data was obtained and used for attenuation correction and anatomic localization. Fasting blood glucose: 107 mg/dl COMPARISON:  CT of the abdomen and pelvis Feb 05, 2021 and CT of the abdomen and pelvis Jan 07, 2021. FINDINGS: Mediastinal blood pool activity: SUV max 2.05 Liver activity: SUV max NA NECK: Asymmetric muscular activity in the neck in prevertebral musculature. LEFT over RIGHT without visible lesion. Incidental CT findings: none CHEST: Spiculated nodule in the LEFT upper lobe measures  approximately 8 x 8 mm with some surrounding spiculation (image 86/7) metabolic activity not elevated above mediastinal blood pool. Signs of pulmonary emphysema. Airways are patent. No adenopathy in the chest or signs of suspicious nodule elsewhere. Incidental CT findings: Calcified atherosclerotic plaque of the thoracic aorta. 4 cm caliber of the ascending thoracic aorta. Minimal scattered atherosclerotic plaque with calcification. Heart size normal without pericardial effusion. Normal caliber central pulmonary vessels. Normal appearance of the esophagus. ABDOMEN/PELVIS: No signs of solid organ metastasis. Cyst in the RIGHT hepatic lobe. Marked hypermetabolic activity about the Barry [name]'s known rectal mass. This mass appears less well-defined. The mesorectum is filled with low attenuation to the LEFT posterolateral aspect of the mesorectum with crescentic appearance tracking into the upper sphincter complex, horseshoe shaped low attenuation surrounds the rectum and anus as it extends towards the pelvic floor. Mass measuring approximately 6.5 x 4.9 cm (image 248/3) maximum SUV 17.6. Lenticular area of low attenuation with some peripheral increased metabolic activity, more nodular along the LEFT posterolateral margin on image 257 of series 3. This area measures approximately 2.7 cm in greatest thickness and surrounds the rectum and anus approximately 270 degrees of its circumference above the pelvic floor and at the pelvic floor slightly less. Bulky RIGHT pelvic sidewall/hypogastric lymph node outside of the mesorectum with stippled calcification measuring 19 mm and without signs of increased metabolic activity. Small LEFT hypogastric lymph node just peripheral to the internal external bifurcation on image 228 of series 3 displays low level FDG uptake with a maximum SUV of 3.2. High RIGHT internal just below or at the internal/common iliac transition, lymph node (image 214/3) 12 mm maximum SUV of 4.8. LEFT high  hypogastric lymph node (image 217/3) 8 mm with low level FDG uptake, maximum SUV of 3.0. Scattered lymph nodes throughout the retroperitoneum with FDG uptake only slightly above mediastinal blood pool. For instance on image 181 of series 3 is an 8 mm lymph node with a maximum  SUV of 2.2 along the LEFT periaortic chain. Bilateral inguinal lymph nodes largest on the RIGHT (image 248/3) 11 mm with a maximum SUV of 2.8 Primary tumor difficult to assess in terms of margins due to surrounding stranding which has worsened since previous imaging. Incidental CT findings: Tiny hypodensity in the RIGHT hemi liver (image 138/3) no focal increased FDG uptake though this 4 mm area may be below PET resolution in this context. SKELETON: Diffuse increased metabolic activity throughout the visualized skeleton without focal area of activity to suggest bony metastasis. Incidental CT findings: none IMPRESSION: Signs of locally advanced rectal cancer with ballooning of the mesorectum now with low attenuation material that was not present on previous examinations (not present on 02-01-2023 and much less pronounced on 03-02-2023) with extensive stranding and inflammation. Findings suspicious for rectal compromise/focal defect in the rectum at the site of tumor or along the LEFT posterolateral aspect and contained inflammation in the mesorectum. Rectal tumor also appears bulkier in general and part of this could be due to marked worsening in short interval of rectal tumor. Large lymph node along the RIGHT pelvic sidewall and smaller lymph nodes as outlined above in the pelvis outside of the mesorectum including inguinal lymph nodes which are suspicious for disease. Note that the dominant lymph node displays little FDG uptake, potentially due to necrosis. Lymph nodes in the groin are equivocal in the setting of extensive inflammation and potentially reactive showing enlargement since previous imaging from 02-01-23. No signs of solid organ  metastasis. Diffuse marrow uptake likely related to symptomatic anemia. Electronically Signed: By: Zetta Bills M.D. On: 02/14/2021 08:58   CT IMAGE GUIDED DRAINAGE BY PERCUTANEOUS CATHETER  Result Date: 02/15/2021 INDICATION: 55 year old gentleman with perirectal abscess presents to IR for CT-guided drain placement EXAM: CT GUIDED DRAINAGE OF PERIRECTAL ABSCESS MEDICATIONS: The Barry [name] is currently admitted to the hospital and receiving intravenous antibiotics. The antibiotics were administered within an appropriate time frame prior to the initiation of the procedure. ANESTHESIA/SEDATION: Two mg IV Versed 100 mcg IV Fentanyl Moderate Sedation Time:  10 minutes The Barry [name] was continuously monitored during the procedure by the interventional radiology nurse under my direct supervision. COMPLICATIONS: None immediate. TECHNIQUE: Informed written consent was obtained from the Barry [name] after a thorough discussion of the procedural risks, benefits and alternatives. All questions were addressed. Maximal Sterile Barrier Technique was utilized including caps, mask, sterile gowns, sterile gloves, sterile drape, hand hygiene and skin antiseptic. A timeout was performed prior to the initiation of the procedure. PROCEDURE: Barry [name] position prone on the procedure table. The posterior pelvic wall skin prepped and draped in usual fashion. Following local lidocaine administration, 18 gauge needle advanced into the perirectal fluid collection utilizing CT guidance. 18 gauge needle exchanged for 10 [place] multipurpose pigtail drain over 0.035 inch guidewire. 50 mL purulent sample aspirated and sent for Gram stain and culture. Drain secured to skin with suture and connected to bulb suction. Post placement CT confirmed appropriate placement of the drain. IMPRESSION: 10 [place] multipurpose pigtail drain placed in perirectal abscess. Electronically Signed   By: Miachel Roux M.D.   On: 02/15/2021 15:24    Rectal cancer  [facility]) #55 year old male Barry [name] with history of newly diagnosed rectal cancer is currently in the hospital for worsening abdominal pain  #Rectal cancer at least locally advanced given the positive pelvic lymphadenopathy CT scan.  Barry [name] still awaiting further staging work-up.  #Perirectal abscess/leukemoid reaction-s/p drainage; with significant improvement of symptoms.  Continue antibiotics.  Recommendations:  #  Recommend avoiding MRI abdomen given rectal abscess at this time.  Await resolution of acute issues before imaging.  Also discussed the rationale for holding chemotherapy given his acute infectious issues.  Abnormality was also discussed with Barry [name]'s wife/Barry [name] in agreement.  #Also recommend holding Mediport placement as planned for [6/13]-given his acute issues. In agreement.   Thank you Dr.Patel  for allowing me to participate in the care of your pleasant Barry [name]. Please do not hesitate to contact me with questions or concerns in the interim.  # I reviewed the blood work- with the Barry [name] in detail; also reviewed the imaging independently [as summarized above]; and with the Barry [name] in detail.   All questions were answered. The Barry [name] knows to call the clinic with any problems, questions or concerns.    Cammie Sickle, MD 02/16/2021 1:43 AM

## 2021-02-15 NOTE — Progress Notes (Signed)
Dr Sidney Ace notified at completion of patient's first unit of blood, temp was 102.2. Orders were given to give 650 mg more of tylenol and hold off on other unit transfusion until temp has come down. Patient's MEWS score remains yellow due to HR and temp. Will continue to monitor. LR fluids were resumed in the meantime. LR fluids were held per Dr Sidney Ace during blood transfusion.

## 2021-02-15 NOTE — Procedures (Signed)
Interventional Radiology Procedure Note  Procedure: CT guided peri-rectal abscess drain placement.  Indication: Peri-rectal abscess.  Findings: Please refer to procedural dictation for full description.  Complications: None  EBL: < 10 mL  Barry Roux, MD [phone]

## 2021-02-15 NOTE — Progress Notes (Signed)
Patient clinically stable post Peri rectal abscess drain placement per Dr Dwaine Gale, tolerated well, denies complaints post procedure. 10FR drain placed with tan/grey prululent material 100 ml out. Awake/alert and oriented post procedure. Taken back to room post procedure/recovery with report given to care nurse at bedside [place] with questions answered. Received Versed 2 mg along with Fentanyl 100 mcg IV for procedure.

## 2021-02-15 NOTE — Progress Notes (Signed)
[facility] NAME: Barry [name]    MR#:  [id number]  DATE OF BIRTH:  Jan 03, 1966  SUBJECTIVE:  CHIEF COMPLAINT:   Chief Complaint  Patient presents with   Rectal Bleeding  Denies any further rectal bleeding.  Waiting for drainage of abscess REVIEW OF SYSTEMS:  Review of Systems  Constitutional:  Negative for diaphoresis, fever, malaise/fatigue and weight loss.  HENT:  Negative for ear discharge, ear pain, hearing loss, nosebleeds, sore throat and tinnitus.   Eyes:  Negative for blurred vision and pain.  Respiratory:  Negative for cough, hemoptysis, shortness of breath and wheezing.   Cardiovascular:  Negative for chest pain, palpitations, orthopnea and leg swelling.  Gastrointestinal:  Negative for abdominal pain, blood in stool, constipation, diarrhea, heartburn, nausea and vomiting.  Genitourinary:  Negative for dysuria, frequency and urgency.  Musculoskeletal:  Negative for back pain and myalgias.  Skin:  Negative for itching and rash.       Rectal abscess  Neurological:  Negative for dizziness, tingling, tremors, focal weakness, seizures, weakness and headaches.  Psychiatric/Behavioral:  Negative for depression. The patient is not nervous/anxious.   DRUG ALLERGIES:   Allergies  Allergen Reactions   Shellfish Allergy Swelling   VITALS:  Blood pressure 117/60, pulse (!) 109, temperature 99.3 F (37.4 C), resp. rate 18, height 5\' 8"  (1.727 m), weight 67.6 kg, SpO2 96 %. PHYSICAL EXAMINATION:  Physical Exam 55 year old male lying in the bed in some pain Lungs clear to auscultation bilaterally no wheezing rales rhonchi crepitation Cardiovascular [place] normal no murmur rales or gallop GI: Soft, benign.  He does have his perirectal swelling Neuro alert and oriented, nonfocal LABORATORY PANEL:  Male CBC Recent Labs  Lab 02/15/21 0429  WBC 43.4*  HGB 8.4*  HCT 25.8*  PLT 590*    ------------------------------------------------------------------------------------------------------------------ Chemistries  Recent Labs  Lab 02/15/21 0429  NA 130*  K 4.4  CL 95*  CO2 26  GLUCOSE 124*  BUN 10  CREATININE 0.95  CALCIUM 8.2*  AST 19  ALT 19  ALKPHOS 85  BILITOT 0.8   RADIOLOGY:  CT IMAGE GUIDED DRAINAGE BY PERCUTANEOUS CATHETER  Result Date: 02/15/2021 INDICATION: 55 year old gentleman with perirectal abscess presents to IR for CT-guided drain placement EXAM: CT GUIDED DRAINAGE OF PERIRECTAL ABSCESS MEDICATIONS: The patient is currently admitted to the hospital and receiving intravenous antibiotics. The antibiotics were administered within an appropriate time frame prior to the initiation of the procedure. ANESTHESIA/SEDATION: Two mg IV Versed 100 mcg IV Fentanyl Moderate Sedation Time:  10 minutes The patient was continuously monitored during the procedure by the interventional radiology nurse under my direct supervision. COMPLICATIONS: None immediate. TECHNIQUE: Informed written consent was obtained from the patient after a thorough discussion of the procedural risks, benefits and alternatives. All questions were addressed. Maximal Sterile Barrier Technique was utilized including caps, mask, sterile gowns, sterile gloves, sterile drape, hand hygiene and skin antiseptic. A timeout was performed prior to the initiation of the procedure. PROCEDURE: Patient position prone on the procedure table. The posterior pelvic wall skin prepped and draped in usual fashion. Following local lidocaine administration, 18 gauge needle advanced into the perirectal fluid collection utilizing CT guidance. 18 gauge needle exchanged for 10 [place] multipurpose pigtail drain over 0.035 inch guidewire. 50 mL purulent sample aspirated and sent for Gram stain and culture. Drain secured to skin with suture and connected to bulb suction. Post placement CT confirmed appropriate placement of the drain.  IMPRESSION: 10 [place] multipurpose pigtail drain placed in perirectal abscess. Electronically Signed   By: Miachel Roux M.D.   On: 02/15/2021 15:24   ASSESSMENT AND PLAN:  55 year old male with metastatic rectal mass admitted for perirectal abscess  [name] rectal abscess S/p CT-guided aspiration/drain placement on 6/10 Appreciate surgical input.  Continue IV Zosyn for now   IDA: Type and screen and transfuse if hb is 7 or below. Attribute to c/hospital blood loss rectal cancer.   Tobacco abuse: Pt has quit since his last admission and is on nicotine patch.   Abnormal PET scan: Outpatient follow-up with cancer center with Dr. Tasia Catchings for ongoing treatment of rectal cancer    Body mass index is 22.66 kg/m.  Net IO Since Admission: 1,932.25 mL [02/15/21 1648]      Status is: Inpatient  Remains inpatient appropriate because:Inpatient level of care appropriate due to severity of illness  Dispo: The patient is from: Home              Anticipated d/c is to: Home              Patient currently is not medically stable to d/c.   Difficult to place patient No       DVT prophylaxis:       Place and maintain sequential compression device Start: 02/14/21 2321 SCDs Start: 02/14/21 1541     Family Communication:  "discussed with patient"   All the records are reviewed and case discussed with Care Management/Social Worker. Management plans discussed with the patient, nursing and they are in agreement.  CODE STATUS: Full Code Level of care: Med-Surg  TOTAL TIME TAKING CARE OF THIS PATIENT: 35 minutes.   More than 50% of the time was spent in counseling/coordination of care: YES  POSSIBLE D/C IN 2-3 DAYS, DEPENDING ON CLINICAL CONDITION.   Max Sane M.D on 02/15/2021 at 4:48 PM  [facility]   CC: Primary care physician; Patient, No Pcp Per (Inactive)  Note: This dictation was prepared with Dragon dictation along with smaller phrase technology. Any transcriptional  errors that result from this process are unintentional.

## 2021-02-15 NOTE — Progress Notes (Addendum)
[facility] SURGICAL PROGRESS NOTE (cpt [id number])  Hospital Day(s): 1.   Interval History:  Patient seen and examined,  Overnight, he did develop fever with T-Max 102.2. Hgb also down to 6.9 and he was transfused 1 units pRBCs Patient reports he continues to have significant swelling and pain in his perineal and perirectal area. No nausea, emesis  Leukocytosis has improved some; 43.4K Renal function remains normal; sCr - 0.95; UO - 600 ccs Remains with hyponatremia to 130 Lactic acid level normal this morning at 1.7 He is currently NPO He is having bowel function  Review of Systems:  Constitutional: denies fever, chills  HEENT: denies cough or congestion  Respiratory: denies any shortness of breath  Cardiovascular: denies chest pain or palpitations  Gastrointestinal: + Peri-rectal pain, denied N/V, or diarrhea/and bowel function as per interval history Genitourinary: denies burning with urination or urinary frequency   Vital signs in last 24 hours: [min-max] current  Temp:  [98.3 F (36.8 C)-102.2 F (39 C)] 99.8 F (37.7 C) (06/10 0615) Pulse Rate:  [91-111] 91 (06/10 0615) Resp:  [16-20] 18 (06/10 0615) BP: (100-123)/(52-81) 100/69 (06/10 0615) SpO2:  [98 %-100 %] 100 % (06/10 0256) Weight:  [67.6 kg] 67.6 kg (06/09 1206)     Height: 5\' 8"  (172.7 cm) Weight: 67.6 kg BMI (Calculated): 22.66   Intake/Output last 2 shifts:  06/09 0701 - 06/10 0700 In: 2532.3 [P.O.:720; I.V.:1516.3; Blood:296] Out: 600 [Urine:600]   Physical Exam:  Constitutional: alert, cooperative and no distress  HENT: normocephalic without obvious abnormality  Eyes: PERRL, EOM's grossly intact and symmetric  Respiratory: breathing non-labored at rest  Cardiovascular: regular rate and sinus rhythm  Gastrointestinal: Abdomen is soft, non-tender, non-distended. He does have peri-rectal swelling, tenderness, no gross erythema  Musculoskeletal: no edema or wounds, motor and sensation  grossly intact, NT    Labs:  CBC Latest Ref Rng & Units 02/15/2021 02/14/2021 02/14/2021  WBC 4.0 - 10.5 K/uL 43.4(H) 49.5(H) 49.8(H)  Hemoglobin 13.0 - 17.0 g/dL 8.4(L) 6.9(L) 7.7(L)  Hematocrit 39.0 - 52.0 % 25.8(L) 21.2(L) 23.6(L)  Platelets 150 - 400 K/uL 590(H) 609(H) 637(H)   CMP Latest Ref Rng & Units 02/15/2021 02/14/2021 02/05/2021  Glucose 70 - 99 mg/dL 124(H) 115(H) 89  BUN 6 - 20 mg/dL 10 13 11   Creatinine 0.61 - 1.24 mg/dL 0.95 1.01 1.14  Sodium 135 - 145 mmol/L 130(L) 132(L) 134(L)  Potassium 3.5 - 5.1 mmol/L 4.4 4.7 4.1  Chloride 98 - 111 mmol/L 95(L) 96(L) 99  CO2 22 - 32 mmol/L 26 27 24   Calcium 8.9 - 10.3 mg/dL 8.2(L) 8.4(L) 8.5(L)  Total Protein 6.5 - 8.1 g/dL 7.8 8.7(H) 8.3(H)  Total Bilirubin 0.3 - 1.2 mg/dL 0.8 0.5 0.5  Alkaline Phos 38 - 126 U/L 85 97 79  AST 15 - 41 U/L 19 17 14(L)  ALT 0 - 44 U/L 19 20 13      Imaging studies: No new pertinent imaging studies   Assessment/Plan: (ICD-10's: K61.89) 55 y.o. male with metastatic rectal mass with likely contained perforation without gross pneumoperitoneum or clinical evidence of obstruction -vs-perirectal abscess. Fever overnight potentially due to strong inflammatory response to tumor versus infection.   - Discussed again with IR; plan for CT Guided Drainage today; ordered   - No emergent surgical intervention currently.He is currently not septic nor peritonitic and there is no evidence of complete obstruction clinically. I do worry that at some point he may become obstructed  which would require palliative diverting ostomy,  which he and his wife are aware of.   - NPO + IVF Resuscitation - Agree with IV Abx (Zosyn) - Monitor leukocytosis; improved some             - Consider oncology consultation inpatient - Monitor abdominal examination; on-going bowel function - Pain control prn; antiemetics prn  - DVT prophylaxis; hold for possible IR procedure    All of the above findings and recommendations were discussed  with the patient, and the medical team, and all of patient's questions were answered to his expressed satisfaction.  -- Edison Simon, PA-C [facility] 02/15/2021, 7:36 AM [phone] M-F: 7am - 4pm   I saw and evaluated the patient.  I agree with the above documentation, exam, and plan, which I have edited where appropriate. Fredirick Maudlin  1:14 PM

## 2021-02-15 NOTE — Consult Note (Signed)
Chief Complaint: Patient was seen in consultation today for rectal abscess drain placement.  Referring Physician(s): Edison Simon, PA-C  Supervising Physician: Mir, Sharen Heck  Patient Status: [facility] - In-pt  History of Present Illness: Barry [name]. is a 55 y.o. male with a past medical history significant for recently diagnosed rectal cancer who presented to [facility] ED on 6/9 with complaints of acutely worsened rectal pain. Workup in the ED showed significant leukocytosis without fever (50.1) with normal lactic acid. CT abdomen/pelvis showed signs of a presumed perforated rectal mass with contained perforation with fluid and gas extending above and below the pelvic floor, potentially involving the sphincter complex and extending into the left ischial rectal fossa which was not see on 02/05/21 CT. He was seen by general surgery and as he had no indication for emergent surgical intervention IR has been consulted for percutaneous aspiration/drain placement.  [name] seen at bedside today, he reports ongoing significant rectal pain which he describes as pressure and fullness. He has not had a recent bowel movement but did not note any blood in previous bowel movements. He was eating and drinking well prior to midnight. He is concerned about his wife being unable to visit him if there are 2 people already here so he is wondering if security can limit his visitors to just his wife and one other person. He understands the procedure today and is agreeable to proceed as planned.  Past Medical History:  Diagnosis Date   Headache    Left shoulder pain    Rectal cancer [facility])     Past Surgical History:  Procedure Laterality Date   COLONOSCOPY WITH PROPOFOL N/A 02/06/2021   Procedure: COLONOSCOPY WITH PROPOFOL;  Surgeon: Lin Landsman, MD;  Location: [facility] ENDOSCOPY;  Service: Gastroenterology;  Laterality: N/A;   ROTATOR CUFF REPAIR Left     Allergies: Shellfish  allergy  Medications: Prior to Admission medications   Medication Sig Start Date End Date Taking? Authorizing Provider  acetaminophen (TYLENOL) 325 MG tablet Take 650 mg by mouth every 6 (six) hours as needed.   Yes [provider]  ferrous sulfate 325 (65 FE) MG tablet Take 1 tablet (325 mg total) by mouth 2 (two) times daily with a meal. 02/06/21  Yes Lorella Nimrod, MD  HYDROcodone-acetaminophen (NORCO) 5-325 MG tablet Take 1 tablet by mouth every 6 (six) hours as needed for moderate pain. 02/08/21  Yes Earlie Server, MD  nicotine (NICODERM CQ - DOSED IN MG/24 HOURS) 21 mg/24hr patch Place 1 patch (21 mg total) onto the skin daily. 02/07/21  Yes Lorella Nimrod, MD  senna-docusate (SENOKOT-S) 8.6-50 MG tablet Take 2 tablets by mouth daily. 02/08/21  Yes Earlie Server, MD  alum & mag hydroxide-simeth (MAALOX/MYLANTA) 200-200-20 MG/5ML suspension Take 15 mLs by mouth daily. Patient not taking: No sig reported    [provider]  feeding supplement (ENSURE ENLIVE / ENSURE PLUS) LIQD Take 237 mLs by mouth daily. 02/07/21   Lorella Nimrod, MD  Multiple Vitamin (MULTIVITAMIN WITH MINERALS) TABS tablet Take 1 tablet by mouth daily. Patient not taking: Reported on 02/14/2021    [provider]  polyethylene glycol powder (GLYCOLAX/MIRALAX) 17 GM/SCOOP powder 1 cap full in a full glass of water, once a day as needed for constipation 01/08/20   Carrie Mew, MD     Family History  Problem Relation Age of Onset   Cancer Sister    Diabetes Mother    Cancer Maternal Grandmother    Cancer Paternal  Grandmother     Social History   Socioeconomic History   Marital status: Married    Spouse name: Not on file   Number of children: Not on file   Years of education: Not on file   Highest education level: Not on file  Occupational History   Not on file  Tobacco Use   Smoking status: Former    Packs/day: 1.00    Years: 10.00    Pack years: 10.00    Types: Cigars, Cigarettes    Quit date:  02/04/2021    Years since quitting: 0.0   Smokeless tobacco: Never  Substance and Sexual Activity   Alcohol use: Not Currently   Drug use: No   Sexual activity: Not on file  Other Topics Concern   Not on file  Social History Narrative   Not on file   Social Determinants of Health   Financial Resource Strain: Not on file  Food Insecurity: Not on file  Transportation Needs: Not on file  Physical Activity: Not on file  Stress: Not on file  Social Connections: Not on file     Review of Systems: A 12 point ROS discussed and pertinent positives are indicated in the HPI above.  All other systems are negative.  Review of Systems  Constitutional:  Negative for appetite change, chills and fever.  Respiratory:  Negative for cough and shortness of breath.   Cardiovascular:  Negative for chest pain.  Gastrointestinal:  Positive for rectal pain. Negative for abdominal pain, blood in stool, diarrhea, nausea and vomiting.  Musculoskeletal:  Negative for back pain.  Neurological:  Negative for dizziness and headaches.   Vital Signs: BP 100/69 (BP Location: Right Arm)   Pulse 91   Temp 99.8 F (37.7 C) (Oral)   Resp 18   Ht 5\' 8"  (1.727 m)   Wt 149 lb (67.6 kg)   SpO2 100%   BMI 22.66 kg/m   Physical Exam Vitals and nursing note reviewed.  Constitutional:      General: He is not in acute distress.    Comments: Appears uncomfortable, laying in left lateral decubitus position due to rectal pain when laying on back/sitting up.   HENT:     Head: Normocephalic and atraumatic.     Mouth/Throat:     Mouth: Mucous membranes are moist.     Pharynx: Oropharynx is clear. No oropharyngeal exudate or posterior oropharyngeal erythema.  Cardiovascular:     Rate and Rhythm: Normal rate and regular rhythm.  Pulmonary:     Effort: Pulmonary effort is normal.     Breath sounds: Normal breath sounds.  Abdominal:     General: There is no distension.     Palpations: Abdomen is soft.      Tenderness: There is no abdominal tenderness.  Skin:    General: Skin is warm and dry.  Neurological:     Mental Status: He is alert and oriented to person, place, and time.  Psychiatric:        Mood and Affect: Mood normal.        Behavior: Behavior normal.        Thought Content: Thought content normal.        Judgment: Judgment normal.     MD Evaluation Airway: WNL Heart: WNL Abdomen: WNL Chest/ Lungs: WNL ASA  Classification: 3 Mallampati/Airway Score: One   Imaging: CT ABDOMEN PELVIS WO CONTRAST  Result Date: 02/14/2021 CLINICAL DATA:  New rectal cancer with severe pain. EXAM: CT ABDOMEN AND  PELVIS WITHOUT CONTRAST TECHNIQUE: Multidetector CT imaging of the abdomen and pelvis was performed following the standard protocol without IV contrast. COMPARISON:  PET CT that was performed on February 13, 2021. FINDINGS: Lower chest: Incidental imaging of the lung bases is unremarkable. Hepatobiliary: Small low-density lesion in the RIGHT hepatic lobe (image 9/2 6 mm, likely a small cyst. Small cyst along the RIGHT hepatic margin. No pericholecystic stranding. No gross biliary duct distension. Pancreas: Pancreas with mild atrophy. No sign of adjacent inflammation. Spleen: Spleen normal size and contour. Adrenals/Urinary Tract: Adrenal glands are normal. Symmetric smooth contour of bilateral kidneys. No hydronephrosis. No nephrolithiasis. Urinary bladder with smooth contours but extending into the lower abdomen with moderate to marked distension. Stomach/Bowel: Stomach without evidence of adjacent stranding. No small bowel dilation. Normal appendix. Colon is stool filled without signs of obstruction. At the descending/sigmoid junction there is a linear area of increased calcific density within the fecal stream potentially a small fishbone. No adjacent stranding or free air. Distal to this is the large irregular colonic tumor with increasing size of a horseshoe shaped area of low attenuation about the  rectum. Small fluid collection lateral to the pubic 0 wreck talus muscle on image 82 of series 2 contains gas and has developed in the short interval since the study of Feb 05, 2021, quite subtle on the PET scan of only 1 day ago now containing gas, not containing gas on the recent PET exam. Small amount a of gas also seen in the fluid density that tracks into the perianal region, potentially within the sphincter complex in the intersphincteric plane though not clearly delineated on the current study bulging normal anatomy along the region of the sphincter complex. Ulcerated and irregular circumferential mass. Fluid begins above the pelvic floor and tracks below the pelvic floor along with LEFT ischial rectal fossa collection described above. Associated nodal enlargement in the RIGHT hemipelvis as as outlined on the previous PET exam along with hypogastric lymph nodes and extensive mesorectal soft tissue. Inguinal lymph nodes with enlargement, potentially reactive given above findings. No free intraperitoneal air. Vascular/Lymphatic: No retroperitoneal adenopathy. Calcified atheromatous plaque of the abdominal aorta. No aneurysmal dilation. Reproductive: Inseparable from the process in the mesorectum. Other: No free air.  No ascites. Musculoskeletal: No acute bone finding or destructive bone process. Spinal degenerative changes. IMPRESSION: Signs of presumed perforated rectal mass with contained perforation with fluid and gas extending above and below the pelvic floor, potentially involving the sphincter complex and extending into LEFT ischial rectal fossa. GI and or surgical consultation may be helpful for further evaluation. Fluid and gas has developed since the Feb 06, 2020 evaluation where there was only minimal low-attenuation outside of the rectum. Signs of nodal disease in the pelvis is outlined on the PET exam. Electronically Signed   By: Zetta Bills M.D.   On: 02/14/2021 13:38   CT ABDOMEN PELVIS WO  CONTRAST  Result Date: 02/05/2021 CLINICAL DATA:  Elevated white blood count. EXAM: CT ABDOMEN AND PELVIS WITHOUT CONTRAST TECHNIQUE: Multidetector CT imaging of the abdomen and pelvis was performed following the standard protocol without IV contrast. COMPARISON:  Jan 08, 2020. FINDINGS: Lower chest: No acute abnormality. Hepatobiliary: No gallstones or biliary dilatation is noted. Grossly stable 1 cm low density is noted along inferior portion of right hepatic lobe. Stable probable small cyst is seen posteriorly in the right hepatic lobe. Enhancing abnormality seen on prior exam in left hepatic lobe is not visualized currently due to the lack  of intravenous contrast administration. Pancreas: Unremarkable. No pancreatic ductal dilatation or surrounding inflammatory changes. Spleen: Normal in size without focal abnormality. Adrenals/Urinary Tract: Adrenal glands are unremarkable. Kidneys are normal, without renal calculi, focal lesion, or hydronephrosis. Bladder is unremarkable. Stomach/Bowel: The stomach appears normal. The appendix appears normal. There is no evidence of bowel obstruction. There appears to be significantly increased wall thickening involving the rectum with a significantly increased amount of inflammatory change in the perirectal soft tissues. This is concerning for rectal malignancy or possibly severe proctitis. Stable linear high density is seen adjacent to sigmoid colon concerning for ingested bone fragment. Vascular/Lymphatic: Aortic atherosclerosis. 3.1 x 2.3 cm right internal iliac lymph node is noted which is new since prior exam and highly concerning for metastatic disease. Reproductive: Prostate is unremarkable. Other: No abdominal wall hernia or abnormality. No abdominopelvic ascites. Musculoskeletal: No acute or significant osseous findings. IMPRESSION: Severe rectal wall thickening is noted with surrounding inflammation which is significantly worse compared to prior exam. There also  appears to be a 3.1 x 2.3 cm right internal iliac lymph node which is concerning for metastatic disease. These findings are concerning for rectal malignancy or severe proctitis. Sigmoidoscopy is recommended. Enhancing abnormality seen in left hepatic lobe on prior exam is not visualized currently due to lack of intravenous contrast. Electronically Signed   By: Marijo Conception M.D.   On: 02/05/2021 11:30   NM PET Image Initial (PI) Skull Base To Thigh  Addendum Date: 02/14/2021   ADDENDUM REPORT: 02/14/2021 13:43 ADDENDUM: For clarification the comparison from May 2nd is from 2021. Nodal disease that is present on the current study and the most recent comparison is not visible on the previous more remote CT assessment. Nor is the fluid density in the area of the mesorectum that tracks above and below the pelvic floor as described. Additionally, there is a spiculated nodule in the LEFT upper lobe with morphologic features that raise the question of small bronchogenic neoplasm measuring 9 x 8 mm (8 x 8 mm in the initial report). Referral to multi disciplinary thoracic oncologic setting may be helpful for further assessment. In the current context could consider short interval follow-up or biopsy for further assessment as warranted. These results will be called to the ordering clinician or representative by the Radiologist Assistant, and communication documented in the PACS or [employer]. Electronically Signed   By: Zetta Bills M.D.   On: 02/14/2021 13:43   Addendum Date: 02/14/2021   ADDENDUM REPORT: 02/14/2021 13:21 ADDENDUM: These results were called to the ordering clinician or representative by the Radiologist Assistant, and communication documented in the PACS or [employer]. Electronically Signed   By: Zetta Bills M.D.   On: 02/14/2021 13:21   Result Date: 02/14/2021 CLINICAL DATA:  Initial treatment strategy for rectal cancer in a 55 year old male. Patient reports severe pain in symptoms  that made the examination challenging. EXAM: NUCLEAR MEDICINE PET SKULL BASE TO THIGH TECHNIQUE: 7.97 mCi F-18 FDG was injected intravenously. Full-ring PET imaging was performed from the skull base to thigh after the radiotracer. CT data was obtained and used for attenuation correction and anatomic localization. Fasting blood glucose: 107 mg/dl COMPARISON:  CT of the abdomen and pelvis Feb 05, 2021 and CT of the abdomen and pelvis Jan 07, 2021. FINDINGS: Mediastinal blood pool activity: SUV max 2.05 Liver activity: SUV max NA NECK: Asymmetric muscular activity in the neck in prevertebral musculature. LEFT over RIGHT without visible lesion. Incidental CT findings: none CHEST:  Spiculated nodule in the LEFT upper lobe measures approximately 8 x 8 mm with some surrounding spiculation (image 62/2) metabolic activity not elevated above mediastinal blood pool. Signs of pulmonary emphysema. Airways are patent. No adenopathy in the chest or signs of suspicious nodule elsewhere. Incidental CT findings: Calcified atherosclerotic plaque of the thoracic aorta. 4 cm caliber of the ascending thoracic aorta. Minimal scattered atherosclerotic plaque with calcification. Heart size normal without pericardial effusion. Normal caliber central pulmonary vessels. Normal appearance of the esophagus. ABDOMEN/PELVIS: No signs of solid organ metastasis. Cyst in the RIGHT hepatic lobe. Marked hypermetabolic activity about the patient's known rectal mass. This mass appears less well-defined. The mesorectum is filled with low attenuation to the LEFT posterolateral aspect of the mesorectum with crescentic appearance tracking into the upper sphincter complex, horseshoe shaped low attenuation surrounds the rectum and anus as it extends towards the pelvic floor. Mass measuring approximately 6.5 x 4.9 cm (image 248/3) maximum SUV 17.6. Lenticular area of low attenuation with some peripheral increased metabolic activity, more nodular along the LEFT  posterolateral margin on image 257 of series 3. This area measures approximately 2.7 cm in greatest thickness and surrounds the rectum and anus approximately 270 degrees of its circumference above the pelvic floor and at the pelvic floor slightly less. Bulky RIGHT pelvic sidewall/hypogastric lymph node outside of the mesorectum with stippled calcification measuring 19 mm and without signs of increased metabolic activity. Small LEFT hypogastric lymph node just peripheral to the internal external bifurcation on image 228 of series 3 displays low level FDG uptake with a maximum SUV of 3.2. High RIGHT internal just below or at the internal/common iliac transition, lymph node (image 214/3) 12 mm maximum SUV of 4.8. LEFT high hypogastric lymph node (image 217/3) 8 mm with low level FDG uptake, maximum SUV of 3.0. Scattered lymph nodes throughout the retroperitoneum with FDG uptake only slightly above mediastinal blood pool. For instance on image 181 of series 3 is an 8 mm lymph node with a maximum SUV of 2.2 along the LEFT periaortic chain. Bilateral inguinal lymph nodes largest on the RIGHT (image 248/3) 11 mm with a maximum SUV of 2.8 Primary tumor difficult to assess in terms of margins due to surrounding stranding which has worsened since previous imaging. Incidental CT findings: Tiny hypodensity in the RIGHT hemi liver (image 138/3) no focal increased FDG uptake though this 4 mm area may be below PET resolution in this context. SKELETON: Diffuse increased metabolic activity throughout the visualized skeleton without focal area of activity to suggest bony metastasis. Incidental CT findings: none IMPRESSION: Signs of locally advanced rectal cancer with ballooning of the mesorectum now with low attenuation material that was not present on previous examinations (not present on Jan 10, 2023 and much less pronounced on 08-Feb-2023) with extensive stranding and inflammation. Findings suspicious for rectal compromise/focal defect in  the rectum at the site of tumor or along the LEFT posterolateral aspect and contained inflammation in the mesorectum. Rectal tumor also appears bulkier in general and part of this could be due to marked worsening in short interval of rectal tumor. Large lymph node along the RIGHT pelvic sidewall and smaller lymph nodes as outlined above in the pelvis outside of the mesorectum including inguinal lymph nodes which are suspicious for disease. Note that the dominant lymph node displays little FDG uptake, potentially due to necrosis. Lymph nodes in the groin are equivocal in the setting of extensive inflammation and potentially reactive showing enlargement since previous imaging from May  2nd. No signs of solid organ metastasis. Diffuse marrow uptake likely related to symptomatic anemia. Electronically Signed: By: Zetta Bills M.D. On: 02/14/2021 08:58    Labs:  CBC: Recent Labs    02/14/21 1210 02/14/21 1315 02/14/21 2248 02/15/21 0429  WBC 50.1* 49.8* 49.5* 43.4*  HGB 8.1* 7.7* 6.9* 8.4*  HCT 25.0* 23.6* 21.2* 25.8*  PLT 659* 637* 609* 590*    COAGS: Recent Labs    02/05/21 1028 02/14/21 1315  INR 1.1 1.2  APTT 36 39*    BMP: Recent Labs    02/05/21 1028 02/05/21 1309 02/14/21 1210 02/15/21 0429  NA 132* 134* 132* 130*  K 4.0 4.1 4.7 4.4  CL 98 99 96* 95*  CO2 25 24 27 26   GLUCOSE 109* 89 115* 124*  BUN 12 11 13 10   CALCIUM 8.5* 8.5* 8.4* 8.2*  CREATININE 1.18 1.14 1.01 0.95  GFRNONAA >60 >60 >60 >60    LIVER FUNCTION TESTS: Recent Labs    02/05/21 1028 02/05/21 1309 02/14/21 1210 02/15/21 0429  BILITOT 0.5 0.5 0.5 0.8  AST 16 14* 17 19  ALT 11 13 20 19   ALKPHOS 78 79 97 85  PROT 8.4* 8.3* 8.7* 7.8  ALBUMIN 3.0* 3.2* 2.5* 2.3*    TUMOR MARKERS: No results for input(s): AFPTM, CEA, CA199, CHROMGRNA in the last 8760 hours.  Assessment and Plan:  55 y/o M with recently diagnosed rectal cancer who presented to [facility] ED on 6/9 with worsening rectal pain.  Imaging shows presumed perforated rectal mass with contained perforation with fluid and gas. He was seen by general surgery and as there is no emergent indication for surgical intervention IR has been consulted for percutaneous aspiration/drain placement. Patient history and imaging reviewed by Dr. Dwaine Gale who approves patient for procedure.  Plan for CT guided aspiration/drain placement later today. Patient to remain NPO, hold anticoagulation until post procedure. After drain is placed flush TID with 5 cc NS, record output Qshift, dressing changes QD. I will place an order for him to follow up in IR clinic for drain check in approximately 10-14 days.  Risks and benefits discussed with the patient including bleeding, infection, damage to adjacent structures, bowel perforation/fistula connection, and sepsis.   All of the patient's questions were answered, patient is agreeable to proceed. Consent signed and in chart.  Thank you for this interesting consult.  I greatly enjoyed meeting Mrk Buzby. and look forward to participating in their care.  A copy of this report was sent to the requesting provider on this date.  Electronically Signed: Joaquim Nam, PA-C 02/15/2021, 9:03 AM   I spent a total of 40 Minutes in face to face in clinical consultation, greater than 50% of which was counseling/coordinating care for rectal abscess drain placement.

## 2021-02-16 DIAGNOSIS — A419 Sepsis, unspecified organism: Secondary | ICD-10-CM

## 2021-02-16 DIAGNOSIS — C189 Malignant neoplasm of colon, unspecified: Secondary | ICD-10-CM

## 2021-02-16 DIAGNOSIS — K631 Perforation of intestine (nontraumatic): Secondary | ICD-10-CM

## 2021-02-16 DIAGNOSIS — K625 Hemorrhage of anus and rectum: Principal | ICD-10-CM

## 2021-02-16 LAB — BASIC METABOLIC PANEL
Anion gap: 8 (ref 5–15)
BUN: 9 mg/dL (ref 6–20)
CO2: 26 mmol/L (ref 22–32)
Calcium: 8.1 mg/dL — ABNORMAL LOW (ref 8.9–10.3)
Chloride: 98 mmol/L (ref 98–111)
Creatinine, Ser: 0.96 mg/dL (ref 0.61–1.24)
GFR, Estimated: >60 mL/min (ref >60)
Glucose, Bld: 117 mg/dL — ABNORMAL HIGH (ref 70–99)
Potassium: 4.2 mmol/L (ref 3.5–5.1)
Sodium: 132 mmol/L — ABNORMAL LOW (ref 135–145)

## 2021-02-16 LAB — CBC
HCT: 23.1 % — ABNORMAL LOW (ref 39.0–52.0)
Hemoglobin: 7.6 g/dL — ABNORMAL LOW (ref 13.0–17.0)
MCH: 21.8 pg — ABNORMAL LOW (ref 26.0–34.0)
MCHC: 32.9 g/dL (ref 30.0–36.0)
MCV: 66.2 fL — ABNORMAL LOW (ref 80.0–100.0)
Platelets: 596 10*3/uL — ABNORMAL HIGH (ref 150–400)
RBC: 3.49 MIL/uL — ABNORMAL LOW (ref 4.22–5.81)
RDW: 21.8 % — ABNORMAL HIGH (ref 11.5–15.5)
WBC: 35 10*3/uL — ABNORMAL HIGH (ref 4.0–10.5)
nRBC: 0.1 % (ref 0.0–0.2)

## 2021-02-16 LAB — URINE CULTURE: Culture: 10000 — AB

## 2021-02-16 MED ORDER — AMOXICILLIN-POT CLAVULANATE 875-125 MG PO TABS: 1.0000 | ORAL_TABLET | Freq: Two times a day (BID) | ORAL | 0 refills | Status: DC

## 2021-02-16 MED ORDER — IBUPROFEN 400 MG PO TABS: 400.0000 mg | ORAL_TABLET | Freq: Four times a day (QID) | ORAL | 0 refills | Status: DC | PRN

## 2021-02-16 MED ORDER — HYDROCODONE-ACETAMINOPHEN 5-325 MG PO TABS
1.0000 | ORAL_TABLET | ORAL | Status: DC | PRN
  Administered 2021-02-16: 1 via ORAL
  Filled 2021-02-16: qty 1

## 2021-02-16 MED ORDER — PIPERACILLIN-TAZOBACTAM 3.375 G IVPB
3.3750 g | Freq: Three times a day (TID) | INTRAVENOUS | Status: DC
  Administered 2021-02-16 (×2): 3.375 g via INTRAVENOUS
  Filled 2021-02-16 (×2): qty 50

## 2021-02-16 NOTE — Assessment & Plan Note (Addendum)
#  55 year old male patient with history of newly diagnosed rectal cancer is currently in the hospital for worsening abdominal pain  #Rectal cancer at least locally advanced given the positive pelvic lymphadenopathy CT scan.  Patient still awaiting further staging work-up.  #Perirectal abscess/leukemoid reaction-s/p drainage; with significant improvement of symptoms.  Continue antibiotics.  Recommendations:  #Recommend avoiding MRI abdomen given rectal abscess at this time.  Await resolution of acute issues before imaging.  Also discussed the rationale for holding chemotherapy given his acute infectious issues.  Abnormality was also discussed with patient's wife/patient in agreement.  #Also recommend holding Mediport placement as planned for [6/13]-given his acute issues. In agreement.   Thank you Dr.Patel  for allowing me to participate in the care of your pleasant patient. Please do not hesitate to contact me with questions or concerns in the interim.  # I reviewed the blood work- with the patient in detail; also reviewed the imaging independently [as summarized above]; and with the patient in detail.

## 2021-02-16 NOTE — Progress Notes (Signed)
Discharge instructions and medication details reviewed with patient and pt's wife. They verbalized understanding. All questions answered. JP drain education provided with teach back. Printed AVS and work note given to patient. 2 IVs removed no complications. Pt escorted out via wheelchair and assisted into his car.   Wynema Birch, RN

## 2021-02-17 ENCOUNTER — Ambulatory Visit: Admission: RE | Admit: 2021-02-17 | Payer: Self-pay | Source: Ambulatory Visit

## 2021-02-17 LAB — BPAM RBC
Blood Product Expiration Date: 202207062359
Blood Product Expiration Date: 202207112359
ISSUE DATE / TIME: 202206100010
Unit Type and Rh: 5100
Unit Type and Rh: 5100

## 2021-02-17 LAB — TYPE AND SCREEN
ABO/RH(D): O POS
Antibody Screen: NEGATIVE
Unit division: 0
Unit division: 0

## 2021-02-17 LAB — PREPARE RBC (CROSSMATCH)

## 2021-02-17 NOTE — Discharge Summary (Signed)
[facility] [place] NAME: Chevon Laufer    MR#:  [id number]  DATE OF BIRTH:  08-08-1966  DATE OF ADMISSION:  02/14/2021   ADMITTING PHYSICIAN: Para Skeans, MD  DATE OF DISCHARGE: 02/16/2021 12:03 PM  PRIMARY CARE PHYSICIAN: Patient, No Pcp Per (Inactive)   ADMISSION DIAGNOSIS:  BRBPR (bright red blood per rectum) [K62.5] Colon perforation (HCC) [K63.1] Malignant neoplasm of colon, unspecified part of colon ([place]) [C18.9] Sepsis, due to unspecified organism, unspecified whether acute organ dysfunction present ([place]) [A41.9] DISCHARGE DIAGNOSIS:  Principal Problem:   BRBPR (bright red blood per rectum) Active Problems:   Bowel wall thickening   Rectal cancer (HCC)   Iron deficiency anemia due to chronic blood loss   Malignant neoplasm of colon ([place])   Sepsis ([place])   Colon perforation ([place])  SECONDARY DIAGNOSIS:   Past Medical History:  Diagnosis Date  . Headache   . Left shoulder pain   . Rectal cancer [facility])    HOSPITAL COURSE:  55 year old male with newly diagnosed rectal mass admitted for perirectal abscess   Peri rectal abscess S/p CT-guided multipurpose 10 French pigtail drain placement on 6/10 50 mL purulent sample aspirated and sent for Gram stain and culture. Drain secured to skin with suture and connected to bulb suction. Improving with antibiotic.  Complete oral antibiotic course as an outpatient Will need outpatient follow-up with oncology and or surgery as needed.   Iron deficiency anemia Stable H&H   Tobacco abuse: Pt has quit since his last admission and is on nicotine patch.   Rectal cancer/locally advanced given the positive pelvic lymphadenopathy seen on CT scan.  Pending further work-up as an outpatient Outpatient follow-up with cancer center with Dr. Tasia Catchings for ongoing work-up and treatment of rectal cancer   DISCHARGE CONDITIONS:  Stable CONSULTS OBTAINED:  Treatment Team:  Fredirick Maudlin, MD DRUG ALLERGIES:    Allergies  Allergen Reactions  . Shellfish Allergy Swelling   DISCHARGE MEDICATIONS:   Allergies as of 02/16/2021       Reactions   Shellfish Allergy Swelling        Medication List     STOP taking these medications    alum & mag hydroxide-simeth 200-200-20 MG/5ML suspension Commonly known as: MAALOX/MYLANTA   multivitamin with minerals Tabs tablet   polyethylene glycol powder 17 GM/SCOOP powder Commonly known as: GLYCOLAX/MIRALAX       TAKE these medications    acetaminophen 325 MG tablet Commonly known as: TYLENOL Take 650 mg by mouth every 6 (six) hours as needed.   amoxicillin-clavulanate 875-125 MG tablet Commonly known as: Augmentin Take 1 tablet by mouth every 12 (twelve) hours for 7 days.   feeding supplement Liqd Take 237 mLs by mouth daily.   ferrous sulfate 325 (65 FE) MG tablet Take 1 tablet (325 mg total) by mouth 2 (two) times daily with a meal.   HYDROcodone-acetaminophen 5-325 MG tablet Commonly known as: Norco Take 1 tablet by mouth every 6 (six) hours as needed for moderate pain.   ibuprofen 400 MG tablet Commonly known as: ADVIL Take 1 tablet (400 mg total) by mouth every 6 (six) hours as needed.   nicotine 21 mg/24hr patch Commonly known as: NICODERM CQ - dosed in mg/24 hours Place 1 patch (21 mg total) onto the skin daily.   senna-docusate 8.6-50 MG tablet Commonly known as: Senokot-S Take 2 tablets by mouth daily.       DISCHARGE INSTRUCTIONS:   DIET:  Regular diet  DISCHARGE CONDITION:  Stable ACTIVITY:  Activity as tolerated OXYGEN:  Home Oxygen: No.  Oxygen Delivery: room air DISCHARGE LOCATION:  home   If you experience worsening of your admission symptoms, develop shortness of breath, life threatening emergency, suicidal or homicidal thoughts you must seek medical attention immediately by calling 911 or calling your MD immediately  if symptoms less severe.  You Must read complete instructions/literature  along with all the possible adverse reactions/side effects for all the Medicines you take and that have been prescribed to you. Take any new Medicines after you have completely understood and accpet all the possible adverse reactions/side effects.   Please note  You were cared for by a hospitalist during your hospital stay. If you have any questions about your discharge medications or the care you received while you were in the hospital after you are discharged, you can call the unit and asked to speak with the hospitalist on call if the hospitalist that took care of you is not available. Once you are discharged, your primary care physician will handle any further medical issues. Please note that NO REFILLS for any discharge medications will be authorized once you are discharged, as it is imperative that you return to your primary care physician (or establish a relationship with a primary care physician if you do not have one) for your aftercare needs so that they can reassess your need for medications and monitor your lab values.    On the day of Discharge:  VITAL SIGNS:  Blood pressure (!) 107/52, pulse (!) 110, temperature 98.8 F (37.1 C), temperature source Oral, resp. rate 20, height 5\' 8"  (1.727 m), weight 67.6 kg, SpO2 100 %. PHYSICAL EXAMINATION:  GENERAL:  55 y.o.-year-old patient lying in the bed with no acute distress.  EYES: Pupils equal, round, reactive to light and accommodation. No scleral icterus. Extraocular muscles intact.  HEENT: Head atraumatic, normocephalic. Oropharynx and nasopharynx clear.  NECK:  Supple, no jugular venous distention. No thyroid enlargement, no tenderness.  LUNGS: Normal breath sounds bilaterally, no wheezing, rales,rhonchi or crepitation. No use of accessory muscles of respiration.  CARDIOVASCULAR: S1, S2 normal. No murmurs, rubs, or gallops.  ABDOMEN: Soft, non-tender, non-distended. Bowel sounds present. No organomegaly or mass.  EXTREMITIES: No pedal  edema, cyanosis, or clubbing.  NEUROLOGIC: Cranial nerves II through XII are intact. Muscle strength 5/5 in all extremities. Sensation intact. Gait not checked.  PSYCHIATRIC: The patient is alert and oriented x 3.  SKIN: Pigtail drain secured to skin with suture and connected to bulb suction which is draining pus. DATA REVIEW:   CBC Recent Labs  Lab 02/16/21 0424  WBC 35.0*  HGB 7.6*  HCT 23.1*  PLT 596*    Chemistries  Recent Labs  Lab 02/15/21 0429 02/16/21 0424  NA 130* 132*  K 4.4 4.2  CL 95* 98  CO2 26 26  GLUCOSE 124* 117*  BUN 10 9  CREATININE 0.95 0.96  CALCIUM 8.2* 8.1*  AST 19  --   ALT 19  --   ALKPHOS 85  --   BILITOT 0.8  --      Outpatient follow-up  Follow-up Information     Mir, Paula Libra, MD Follow up.   Specialties: Interventional Radiology, Diagnostic Radiology, Radiology Why: IR scheduler will call you with appointment date/time (about 10-14 days after your procedure). Please call with questions or concerns prior to your appointment. Contact information: [place] [address] [phone]  Cammie Sickle, MD. Schedule an appointment as soon as possible for a visit in 3 day(s).   Specialties: Internal Medicine, Oncology Why: [facility] Discharge F/UP Contact information: [place] [address] [phone]         Fredirick Maudlin, MD. Schedule an appointment as soon as possible for a visit in 2 week(s).   Specialty: General Surgery Why: For wound re-check, [facility] Discharge F/UP Contact information: [place] [address] [phone]                 30 Day Unplanned Readmission Risk Score    Flowsheet Row ED to Hosp-Admission (Discharged) from 02/14/2021 in [place]  30 Day Unplanned Readmission Risk Score (%) 10.6 Filed at 02/16/2021 1200       This score is the patient's risk of an  unplanned readmission within 30 days of being discharged (0 -100%). The score is based on dignosis, age, lab data, medications, orders, and past utilization.   Low:  0-14.9   Medium: 15-21.9   High: 22-29.9   Extreme: 30 and above           Management plans discussed with the patient, family and they are in agreement.  CODE STATUS: Prior   TOTAL TIME TAKING CARE OF THIS PATIENT: 45 minutes.    Max Sane M.D on 02/17/2021 at 3:07 PM  [facility]   CC: Primary care physician; Patient, No Pcp Per (Inactive)   Note: This dictation was prepared with Dragon dictation along with smaller phrase technology. Any transcriptional errors that result from this process are unintentional.

## 2021-02-18 ENCOUNTER — Ambulatory Visit: Admission: RE | Admit: 2021-02-18 | Payer: Self-pay | Source: Home / Self Care | Admitting: Vascular Surgery

## 2021-02-18 ENCOUNTER — Encounter: Admission: RE | Payer: Self-pay | Source: Home / Self Care

## 2021-02-18 ENCOUNTER — Telehealth: Payer: Self-pay

## 2021-02-18 ENCOUNTER — Other Ambulatory Visit: Payer: Self-pay | Admitting: General Surgery

## 2021-02-18 ENCOUNTER — Other Ambulatory Visit (INDEPENDENT_AMBULATORY_CARE_PROVIDER_SITE_OTHER): Payer: Self-pay | Admitting: Nurse Practitioner

## 2021-02-18 DIAGNOSIS — K611 Rectal abscess: Secondary | ICD-10-CM

## 2021-02-18 DIAGNOSIS — C20 Malignant neoplasm of rectum: Secondary | ICD-10-CM

## 2021-02-18 SURGERY — PORTA CATH INSERTION
Anesthesia: Moderate Sedation

## 2021-02-18 NOTE — Telephone Encounter (Signed)
Call placed to Mr. Rosana Berger. He continues to feel better since having drain placed. Pain is being mostly controlled by Ibuprofen. Hospital follow up arranged with Dr. Tasia Catchings on 6/15 at 1315.

## 2021-02-19 LAB — CULTURE, BLOOD (SINGLE)
Culture: NO GROWTH
Special Requests: ADEQUATE

## 2021-02-20 ENCOUNTER — Telehealth: Payer: Self-pay | Admitting: *Deleted

## 2021-02-20 ENCOUNTER — Emergency Department
Admission: EM | Admit: 2021-02-20 | Discharge: 2021-02-20 | Disposition: A | Discharge status: Home / Self Care | Payer: Self-pay | Attending: Emergency Medicine | Admitting: Emergency Medicine

## 2021-02-20 ENCOUNTER — Emergency Department: Payer: Self-pay

## 2021-02-20 ENCOUNTER — Other Ambulatory Visit: Payer: Self-pay

## 2021-02-20 ENCOUNTER — Inpatient Hospital Stay: Payer: Self-pay | Admitting: Oncology

## 2021-02-20 DIAGNOSIS — Z85048 Personal history of other malignant neoplasm of rectum, rectosigmoid junction, and anus: Secondary | ICD-10-CM | POA: Insufficient documentation

## 2021-02-20 DIAGNOSIS — Z87891 Personal history of nicotine dependence: Secondary | ICD-10-CM | POA: Insufficient documentation

## 2021-02-20 DIAGNOSIS — K612 Anorectal abscess: Secondary | ICD-10-CM

## 2021-02-20 DIAGNOSIS — Z85038 Personal history of other malignant neoplasm of large intestine: Secondary | ICD-10-CM | POA: Insufficient documentation

## 2021-02-20 DIAGNOSIS — T85698A Other mechanical complication of other specified internal prosthetic devices, implants and grafts, initial encounter: Secondary | ICD-10-CM | POA: Insufficient documentation

## 2021-02-20 DIAGNOSIS — G8918 Other acute postprocedural pain: Secondary | ICD-10-CM | POA: Insufficient documentation

## 2021-02-20 DIAGNOSIS — K610 Anal abscess: Secondary | ICD-10-CM | POA: Insufficient documentation

## 2021-02-20 LAB — CBC WITH DIFFERENTIAL/PLATELET
Abs Immature Granulocytes: 0.24 10*3/uL — ABNORMAL HIGH (ref 0.00–0.07)
Basophils Absolute: 0.1 10*3/uL (ref 0.0–0.1)
Basophils Relative: 0 %
Eosinophils Absolute: 0.3 10*3/uL (ref 0.0–0.5)
Eosinophils Relative: 2 %
HCT: 26.2 % — ABNORMAL LOW (ref 39.0–52.0)
Hemoglobin: 8.6 g/dL — ABNORMAL LOW (ref 13.0–17.0)
Immature Granulocytes: 1 %
Lymphocytes Relative: 14 %
Lymphs Abs: 2.7 10*3/uL (ref 0.7–4.0)
MCH: 22.1 pg — ABNORMAL LOW (ref 26.0–34.0)
MCHC: 32.8 g/dL (ref 30.0–36.0)
MCV: 67.4 fL — ABNORMAL LOW (ref 80.0–100.0)
Monocytes Absolute: 1.6 10*3/uL — ABNORMAL HIGH (ref 0.1–1.0)
Monocytes Relative: 9 %
Neutro Abs: 13.8 10*3/uL — ABNORMAL HIGH (ref 1.7–7.7)
Neutrophils Relative %: 74 %
Platelets: 876 10*3/uL — ABNORMAL HIGH (ref 150–400)
RBC: 3.89 MIL/uL — ABNORMAL LOW (ref 4.22–5.81)
RDW: 22.6 % — ABNORMAL HIGH (ref 11.5–15.5)
Smear Review: NORMAL
WBC: 18.7 10*3/uL — ABNORMAL HIGH (ref 4.0–10.5)
nRBC: 0.2 % (ref 0.0–0.2)

## 2021-02-20 LAB — LACTIC ACID, PLASMA: Lactic Acid, Venous: 1.1 mmol/L (ref 0.5–1.9)

## 2021-02-20 LAB — COMPREHENSIVE METABOLIC PANEL
ALT: 21 U/L (ref 0–44)
AST: 13 U/L — ABNORMAL LOW (ref 15–41)
Albumin: 2.2 g/dL — ABNORMAL LOW (ref 3.5–5.0)
Alkaline Phosphatase: 88 U/L (ref 38–126)
Anion gap: 10 (ref 5–15)
BUN: 12 mg/dL (ref 6–20)
CO2: 27 mmol/L (ref 22–32)
Calcium: 8.6 mg/dL — ABNORMAL LOW (ref 8.9–10.3)
Chloride: 100 mmol/L (ref 98–111)
Creatinine, Ser: 0.74 mg/dL (ref 0.61–1.24)
GFR, Estimated: >60 mL/min (ref >60)
Glucose, Bld: 96 mg/dL (ref 70–99)
Potassium: 4.4 mmol/L (ref 3.5–5.1)
Sodium: 137 mmol/L (ref 135–145)
Total Bilirubin: 0.3 mg/dL (ref 0.3–1.2)
Total Protein: 7.9 g/dL (ref 6.5–8.1)

## 2021-02-20 LAB — AEROBIC/ANAEROBIC CULTURE W GRAM STAIN (SURGICAL/DEEP WOUND): Gram Stain: NONE SEEN

## 2021-02-20 MED ORDER — IOHEXOL 300 MG/ML  SOLN
100.0000 mL | Freq: Once | INTRAMUSCULAR | Status: AC | PRN
  Administered 2021-02-20: 100 mL via INTRAVENOUS

## 2021-02-20 MED ORDER — OXYCODONE HCL 5 MG PO TABS
5.0000 mg | ORAL_TABLET | Freq: Once | ORAL | Status: AC
  Administered 2021-02-20: 5 mg via ORAL
  Filled 2021-02-20: qty 1

## 2021-02-20 NOTE — ED Provider Notes (Signed)
[facility] Emergency Department Provider Note  ____________________________________________   Event Date/Time   First MD Initiated Contact with Patient 02/20/21 1148     (approximate)  I have reviewed the triage vital signs and the nursing notes.   HISTORY  Chief Complaint Rectal Cancer and Post-op Problem  HPI Barry [name]. is a 54 y.o. male with below medical history, including rectal cancer with a current sacral JP drain in place, presents to the ED with complaints of a malfunctioning JP drain.  He has yesterday that his drainage bulb was empty. He has noted spontaneous purulent drainage from a separate site on his left cheek, and into his disposable briefs.  He denies any accompanying fever, chills, or sweats.  He has 3 days remaining on oral oral dose of Augmentin for outpatient management of his abscess.   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 Authorizing Provider  acetaminophen (TYLENOL) 325 MG tablet Take 650 mg by mouth every 6 (six) hours as needed.    [provider]  amoxicillin-clavulanate (AUGMENTIN) 875-125 MG tablet Take 1 tablet by mouth every 12 (twelve) hours for 7 days. 02/16/21 02/23/21  Max Sane, MD  feeding supplement (ENSURE ENLIVE / ENSURE PLUS) LIQD Take 237 mLs by mouth daily. 02/07/21   Lorella Nimrod, MD  ferrous sulfate 325 (65 FE) MG tablet Take 1 tablet (325 mg total) by mouth 2 (two) times daily with a meal. 02/06/21   Lorella Nimrod, MD  HYDROcodone-acetaminophen (NORCO) 5-325 MG tablet Take 1 tablet by mouth every 6 (six) hours as needed for moderate pain. 02/08/21   Earlie Server, MD  ibuprofen (ADVIL) 400 MG tablet Take 1 tablet (400 mg total) by mouth every 6 (six) hours as needed. 02/16/21   Max Sane, MD  nicotine (NICODERM CQ - DOSED IN MG/24 HOURS) 21 mg/24hr patch Place 1 patch (21 mg total) onto the skin daily. 02/07/21   Lorella Nimrod, MD  senna-docusate (SENOKOT-S) 8.6-50 MG tablet Take 2 tablets by mouth daily. 02/08/21   Earlie Server, MD    Allergies Shellfish allergy  Family History  Problem Relation Age of Onset   Cancer Sister    Diabetes Mother    Cancer Maternal Grandmother    Cancer Paternal Grandmother     Social History Social History   Tobacco Use   Smoking status: Former    Packs/day: 1.00  Years: 10.00    Pack years: 10.00    Types: Cigars, Cigarettes    Quit date: 02/04/2021    Years since quitting: 0.0   Smokeless tobacco: Never  Substance Use Topics   Alcohol use: Not Currently   Drug use: No    Review of Systems  Constitutional: No fever/chills Eyes: No visual changes. ENT: No sore throat. Cardiovascular: Denies chest pain. Respiratory: Denies shortness of breath. Gastrointestinal: No abdominal pain.  No nausea, no vomiting.  No diarrhea.  No constipation. Genitourinary: Negative for dysuria. Musculoskeletal: Negative for back pain. Skin:  Negative for rash. Purulent drainage from the left gluteus  Neurological: Negative for headaches, focal weakness or numbness. ____________________________________________   PHYSICAL EXAM:  VITAL SIGNS: ED Triage Vitals  Enc Vitals Group     BP 02/20/21 1058 99/62     Pulse Rate 02/20/21 1058 (!) 103     Resp 02/20/21 1058 18     Temp 02/20/21 1058 98.4 F (36.9 C)     Temp Source 02/20/21 1058 Oral     SpO2 02/20/21 1058 99 %     Weight 02/20/21 1053 149 lb (67.6 kg)     Height 02/20/21 1053 5\' 8"  (1.727 m)     Head Circumference --      Peak Flow --      Pain Score 02/20/21 1052 10     Pain Loc --      Pain Edu? --      Excl. in [place]? --     Constitutional: Alert and oriented. Well appearing and in no acute distress. Eyes: Conjunctivae are normal. PERRL. EOMI. Head: Atraumatic. Nose: No congestion/rhinnorhea. Mouth/Throat: Mucous membranes are moist.  Oropharynx non-erythematous. Neck: No stridor.   Cardiovascular: Normal rate, regular rhythm. Grossly normal heart sounds.  Good peripheral circulation. Respiratory: Normal respiratory effort.  No retractions. Lungs CTAB. Gastrointestinal: Soft and nontender. No distention. No abdominal bruits. No CVA tenderness. Musculoskeletal: No lower extremity tenderness nor edema.  No joint effusions. Neurologic:  Normal speech and language. No gross focal neurologic deficits are appreciated. No gait instability. Skin:  Skin is warm, dry and intact. No rash noted.  Patient with a JP drain noted to the sacrum with a Tegaderm in place.  Patient has a 1 cm spontaneously draining sinus noted to the left mid buttocks.  There is some surrounding induration but no erythema. Psychiatric: Mood and affect are normal. Speech and behavior are normal.  ____________________________________________   LABS (all labs ordered are listed, but only abnormal results are displayed)  Labs Reviewed  COMPREHENSIVE METABOLIC PANEL - Abnormal; Notable for the  following components:      Result Value   Calcium 8.6 (*)    Albumin 2.2 (*)    AST 13 (*)    All other components within normal limits  CBC WITH DIFFERENTIAL/PLATELET - Abnormal; Notable for the following components:   WBC 18.7 (*)    RBC 3.89 (*)    Hemoglobin 8.6 (*)    HCT 26.2 (*)    MCV 67.4 (*)    MCH 22.1 (*)    RDW 22.6 (*)    Platelets 876 (*)    Neutro Abs 13.8 (*)    Monocytes Absolute 1.6 (*)    Abs Immature Granulocytes 0.24 (*)    All other components within normal limits  LACTIC ACID, PLASMA  LACTIC ACID, PLASMA   ____________________________________________  EKG  ____________________________________________  RADIOLOGY I, Melvenia Needles, personally viewed and evaluated these images (  plain radiographs) as part of my medical decision making, as well as reviewing the written report by the radiologist.  ED MD interpretation:  agree with report  Official radiology report(s): CT PELVIS W CONTRAST  Result Date: 02/20/2021 CLINICAL DATA:  55 year old with rectal cancer with a percutaneous drain. Patient complains of fluid leaking around the drain. EXAM: CT PELVIS WITH CONTRAST TECHNIQUE: Multidetector CT imaging of the pelvis was performed using the standard protocol following the bolus administration of intravenous contrast. CONTRAST:  121mL OMNIPAQUE IOHEXOL 300 MG/ML  SOLN COMPARISON:  CT 02/14/2021 FINDINGS: Urinary Tract: Fluid-filled urinary bladder. Right kidney is only partially imaged. Bowel: Known large rectal mass with circumferential thickening in the rectum and anal region. No significant dilatation of the sigmoid colon or other visualized portions of the colon. Normal caliber of the visualized small bowel. Vascular/Lymphatic: Extensive atherosclerotic calcifications. Again noted is low-density nodule or lymph node in the along the right pelvic wall that measures 3.1 x 2.5 cm on sequence 5 image 31. Prominent inguinal lymph nodes bilaterally.  Reproductive:  Prostate is poorly visualized. Other: Percutaneous drain was placed in the perirectal/perianal abscess on 02/15/2021. The large perirectal abscess has markedly decreased in size. Small residual pocket of gas and fluid just inferior to the drain on sequence 5, image 48 that measures 3.0 x 2.4 x 2.2 cm. There is new gas in the subcutaneous tissues of the medial left buttock and there is concern for an ulceration or wound in this area. Scattered gas-filled collections along the medial left buttock and perirectal region. Cannot exclude a fistula connection between the rectal mass and the subcutaneous gas-filled collections. Musculoskeletal: No acute bone abnormality. IMPRESSION: 1. Large perirectal/perianal abscess has markedly decreased in size since placement of the percutaneous drain. There are residual gas-filled collections in the soft tissues and suspect there is a fistula or sinus tract between the rectal mass and the subcutaneous tissues. 2. Soft tissue gas along the medial left buttock and concern for a cutaneous ulceration in this area. 3. Large rectal mass with evidence for a large necrotic right pelvic lymph node. Electronically Signed   By: Markus Daft M.D.   On: 02/20/2021 15:42    ____________________________________________   PROCEDURES  Procedure(s) performed (including Critical Care):  Procedures  Roxicodone 5 mg PO ____________________________________________   INITIAL IMPRESSION / ASSESSMENT AND PLAN / ED COURSE  As part of my medical decision making, I reviewed the following data within the [place] reviewed as above, Radiograph reviewed as above, A consult was requested and obtained from this/these consultant(s) surgery, IR, and Heme/Onc, and Notes from prior ED visits    ----------------------------------------- 4:20 PM on 02/20/2021 ----------------------------------------- S/W Dr. Hampton Abbot: suggests consult to IR to consider  replacement/adjustment of JP drain.    ----------------------------------------- 4:32 PM on 02/20/2021 ----------------------------------------- S/W Dr. Gorden Harms (IR): He feels as though the JP drain has worked as appropriate, and after reviewing the images, feels like this is a sinus tract arising from the rectal mass.  He does not feel that there is any indication for replacing the JP drain.  He is willing to consult the patient if he is admitted to the hospital service.   ----------------------------------------- 6:33 PM on 02/20/2021 ----------------------------------------- S/w Dr. Zenia Resides (Onc): She reviewed the patient's chart, and consulted with Dr. Rogue Bussing.  They recommend tinea outpatient antibiotic therapy.  They are planning on seeing the patient in the clinic tomorrow when he presents for his iron infusion and his chemotherapy teaching.  Patient with the above medical history with a recently diagnosed rectal mass, presents with spontaneous drainage from the left gluteus region despite having a JP drain placed last week.  Patient was evaluated for his complaints and found to have a dramatically improved WBC at 18.7, (down from 49.56 days prior).  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  Patient remained stable in the ED without tachycardia, fever, or signs of sepsis.  At the consult with his oncology team, it is felt that the patient is stable for continued outpatient management, and will continue with oral antibiotics.  He will follow-up with his oncology team tomorrow as planned.  Return precautions have been discussed.  Patient is agreeable at this time to the plan of care.  He is discharged to care of his wife without incident. ____________________________________________   FINAL CLINICAL IMPRESSION(S) / ED DIAGNOSES  Final diagnoses:  Abscess of anal or rectal region     ED  Discharge Orders     None        Note:  This document was prepared using Dragon voice recognition software and may include unintentional dictation errors.    Melvenia Needles, PA-C 02/20/21 1937    Carrie Mew, MD 02/21/21 2324

## 2021-02-20 NOTE — Telephone Encounter (Addendum)
PA from ED called stating that patient is in ED for drainage and she wanted [place] to know because the patient is scheduled for she believes a PET this afternoon.  He is not scheduled for PET, but an appointment to see Dr Tasia Catchings per schedule

## 2021-02-20 NOTE — Discharge Instructions (Addendum)
Continue with antibiotics as prescribed.  Follow-up with your oncologist tomorrow as discussed.  Keep your previously scheduled appointments as planned.

## 2021-02-20 NOTE — ED Triage Notes (Signed)
Pt has rectal CA and has a JP drain in place but states it has not been going into the drainage and is leaking out into his brief.

## 2021-02-20 NOTE — Telephone Encounter (Signed)
Hey Kristi! Just wanted to make you aware.

## 2021-02-21 ENCOUNTER — Other Ambulatory Visit: Payer: Self-pay | Admitting: *Deleted

## 2021-02-21 ENCOUNTER — Inpatient Hospital Stay: Payer: Self-pay

## 2021-02-21 ENCOUNTER — Other Ambulatory Visit: Payer: Self-pay | Admitting: Nurse Practitioner

## 2021-02-21 ENCOUNTER — Inpatient Hospital Stay (HOSPITAL_BASED_OUTPATIENT_CLINIC_OR_DEPARTMENT_OTHER): Payer: Self-pay | Admitting: Nurse Practitioner

## 2021-02-21 VITALS — BP 106/65 | HR 93 | Temp 96.5°F | Resp 18

## 2021-02-21 DIAGNOSIS — K631 Perforation of intestine (nontraumatic): Secondary | ICD-10-CM | POA: Diagnosis not present

## 2021-02-21 DIAGNOSIS — D75839 Thrombocytosis, unspecified: Secondary | ICD-10-CM | POA: Diagnosis not present

## 2021-02-21 DIAGNOSIS — R5383 Other fatigue: Secondary | ICD-10-CM | POA: Diagnosis not present

## 2021-02-21 DIAGNOSIS — K611 Rectal abscess: Secondary | ICD-10-CM

## 2021-02-21 DIAGNOSIS — D500 Iron deficiency anemia secondary to blood loss (chronic): Secondary | ICD-10-CM

## 2021-02-21 DIAGNOSIS — D72829 Elevated white blood cell count, unspecified: Secondary | ICD-10-CM

## 2021-02-21 DIAGNOSIS — C20 Malignant neoplasm of rectum: Secondary | ICD-10-CM

## 2021-02-21 DIAGNOSIS — G893 Neoplasm related pain (acute) (chronic): Secondary | ICD-10-CM | POA: Diagnosis not present

## 2021-02-21 DIAGNOSIS — Z87891 Personal history of nicotine dependence: Secondary | ICD-10-CM | POA: Diagnosis not present

## 2021-02-21 DIAGNOSIS — R198 Other specified symptoms and signs involving the digestive system and abdomen: Secondary | ICD-10-CM | POA: Diagnosis not present

## 2021-02-21 DIAGNOSIS — K610 Anal abscess: Secondary | ICD-10-CM | POA: Diagnosis not present

## 2021-02-21 DIAGNOSIS — Z79891 Long term (current) use of opiate analgesic: Secondary | ICD-10-CM | POA: Diagnosis not present

## 2021-02-21 DIAGNOSIS — R911 Solitary pulmonary nodule: Secondary | ICD-10-CM | POA: Diagnosis not present

## 2021-02-21 DIAGNOSIS — K5909 Other constipation: Secondary | ICD-10-CM | POA: Diagnosis not present

## 2021-02-21 LAB — CBC WITH DIFFERENTIAL/PLATELET
Abs Immature Granulocytes: 0.18 10*3/uL — ABNORMAL HIGH (ref 0.00–0.07)
Basophils Absolute: 0 10*3/uL (ref 0.0–0.1)
Basophils Relative: 0 %
Eosinophils Absolute: 0.3 10*3/uL (ref 0.0–0.5)
Eosinophils Relative: 3 %
HCT: 26.8 % — ABNORMAL LOW (ref 39.0–52.0)
Hemoglobin: 8.4 g/dL — ABNORMAL LOW (ref 13.0–17.0)
Immature Granulocytes: 1 %
Lymphocytes Relative: 22 %
Lymphs Abs: 2.8 10*3/uL (ref 0.7–4.0)
MCH: 21.6 pg — ABNORMAL LOW (ref 26.0–34.0)
MCHC: 31.3 g/dL (ref 30.0–36.0)
MCV: 68.9 fL — ABNORMAL LOW (ref 80.0–100.0)
Monocytes Absolute: 1.2 10*3/uL — ABNORMAL HIGH (ref 0.1–1.0)
Monocytes Relative: 9 %
Neutro Abs: 8.1 10*3/uL — ABNORMAL HIGH (ref 1.7–7.7)
Neutrophils Relative %: 65 %
Platelets: 955 10*3/uL (ref 150–400)
RBC: 3.89 MIL/uL — ABNORMAL LOW (ref 4.22–5.81)
RDW: 22.8 % — ABNORMAL HIGH (ref 11.5–15.5)
WBC: 12.6 10*3/uL — ABNORMAL HIGH (ref 4.0–10.5)
nRBC: 0.2 % (ref 0.0–0.2)

## 2021-02-21 LAB — COMPREHENSIVE METABOLIC PANEL
ALT: 19 U/L (ref 0–44)
AST: 14 U/L — ABNORMAL LOW (ref 15–41)
Albumin: 2.3 g/dL — ABNORMAL LOW (ref 3.5–5.0)
Alkaline Phosphatase: 86 U/L (ref 38–126)
Anion gap: 9 (ref 5–15)
BUN: 14 mg/dL (ref 6–20)
CO2: 27 mmol/L (ref 22–32)
Calcium: 8.7 mg/dL — ABNORMAL LOW (ref 8.9–10.3)
Chloride: 98 mmol/L (ref 98–111)
Creatinine, Ser: 0.93 mg/dL (ref 0.61–1.24)
GFR, Estimated: >60 mL/min (ref >60)
Glucose, Bld: 198 mg/dL — ABNORMAL HIGH (ref 70–99)
Potassium: 4.3 mmol/L (ref 3.5–5.1)
Sodium: 134 mmol/L — ABNORMAL LOW (ref 135–145)
Total Bilirubin: <0.1 mg/dL — ABNORMAL LOW (ref 0.3–1.2)
Total Protein: 8.2 g/dL — ABNORMAL HIGH (ref 6.5–8.1)

## 2021-02-21 LAB — PATHOLOGIST SMEAR REVIEW

## 2021-02-21 MED ORDER — SODIUM CHLORIDE 0.9 % IV SOLN: 200.0000 mg | Freq: Once | INTRAVENOUS | Status: DC

## 2021-02-21 MED ORDER — SODIUM CHLORIDE 0.9 % IV SOLN
Freq: Once | INTRAVENOUS | Status: AC
  Filled 2021-02-21: qty 250

## 2021-02-21 MED ORDER — IRON SUCROSE 20 MG/ML IV SOLN
200.0000 mg | Freq: Once | INTRAVENOUS | Status: AC
Start: 2021-02-21 — End: 2021-02-21
  Administered 2021-02-21: 200 mg via INTRAVENOUS
  Filled 2021-02-21: qty 10

## 2021-02-21 NOTE — Patient Instructions (Signed)

## 2021-02-21 NOTE — Progress Notes (Signed)
Pt is in pain from cancer. Has rectal tube  in and it is hurting him.

## 2021-02-21 NOTE — Progress Notes (Signed)
Hematology/Oncology Progress Note [facility] [phone] [phone]   Patient Care Team: Pcp, No as PCP - General [name], RN as Oncology Nurse Navigator  REFERRING PROVIDER: Lorella Nimrod, MD   CHIEF COMPLAINTS/REASON FOR VISIT:  Wound drainage  HISTORY OF PRESENTING ILLNESS:  Barry Goldwire. is a  55 y.o.  male with PMH listed below was seen in consultation at the request of  Lorella Nimrod, MD  for evaluation of rectal cancer  02/05/2021-02/06/2021 patient was hospitalized due to generalized weakness, intermittent lightheadedness, weight loss and worsening constipation.  02/05/2021 CT abdomen showed concerning of severe rectal wall thickening and right internal iliac lymph node concerning for metastatic disease.  Patient was seen by gastroenterology and had colonoscopy which showed a circumferential fungating mass in the rectum.  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 Reported pain, constipation, rectal pressure, tenesmus sensation.   Recommendation for PET and MRI for staging, port placement, neoadjuvant chemotherapy with FOLFOX.   Found to have iron deficiency anemia. He started venofer.   Thrombocytosis throught to be secondary to iron deficiency and reactive.   Admitted to hospital for rectal bleeding and pain. CT showed perirectal fluid collection concerning for infection with significant leukocytosis, anemia with hemoglobin 6.8. Received transfusion, zosyn. He underwent IR guided placement of JP drain into rectal abscess. Started augmentin.   Represented to ER on 6/15 with new skin opening and drainage from left gluteus. 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 Hemodynamically stable. He continues oral antibiotics  Interval history: Patient seen in ER on 02/20/2021.  Currently has JP drain in left gluteus. Noticed no drainage and developed pain and new drainage from another site.  He underwent  a CT scan which showed previously large perirectal/perianal abscess with markedly decreased in size since placement of percutaneous drain.  Residual gas/fluid collections of the soft tissue and possible fistula or sinus tract between rectal mass and subcutaneous tissues. Surgery and IR were consulted who felt there was no indication for surgical intervention. His augmentin was continued and he presents to clinic today for follow up and c all ofonsideration of IV iron. He has pain at site of JP drain that interferes with sleep. He is eager to have drain removed. Tolerating augmentin well.  No fevers, chills, racing heart, or weakness.  Review of Systems  Constitutional:  Positive for fatigue. Negative for appetite change and unexpected weight change.  HENT:   Negative for mouth sores, sore throat and trouble swallowing.   Respiratory:  Negative for chest tightness and shortness of breath.   Cardiovascular:  Negative for leg swelling.  Gastrointestinal:  Positive for rectal pain. Negative for abdominal pain, constipation, diarrhea, nausea and vomiting.  Genitourinary:  Negative for bladder incontinence and dysuria.   Musculoskeletal:  Negative for flank pain and neck stiffness.  Skin:  Negative for itching, rash and wound.  Neurological:  Negative for dizziness, headaches, light-headedness and numbness.  Psychiatric/Behavioral:  Positive for sleep disturbance. Negative for confusion and depression. The patient is not nervous/anxious.    MEDICAL HISTORY:  Past Medical History:  Diagnosis Date   Headache    Left shoulder pain    Rectal cancer ([place])     SURGICAL HISTORY: Past Surgical History:  Procedure Laterality Date   COLONOSCOPY WITH PROPOFOL N/A 02/06/2021   Procedure: COLONOSCOPY WITH PROPOFOL;  Surgeon: Lin Landsman, MD;  Location: [facility] ENDOSCOPY;  Service: Gastroenterology;  Laterality: N/A;   ROTATOR CUFF REPAIR Left  SOCIAL HISTORY: Social History   Socioeconomic  History   Marital status: Married    Spouse name: Not on file   Number of children: Not on file   Years of education: Not on file   Highest education level: Not on file  Occupational History   Not on file  Tobacco Use   Smoking status: Former    Packs/day: 1.00    Years: 10.00    Pack years: 10.00    Types: Cigars, Cigarettes    Quit date: 02/04/2021    Years since quitting: 0.0   Smokeless tobacco: Never  Substance and Sexual Activity   Alcohol use: Not Currently   Drug use: No   Sexual activity: Not on file  Other Topics Concern   Not on file  Social History Narrative   Not on file   Social Determinants of Health   Financial Resource Strain: Not on file  Food Insecurity: Not on file  Transportation Needs: Not on file  Physical Activity: Not on file  Stress: Not on file  Social Connections: Not on file  Intimate Partner Violence: Not on file    FAMILY HISTORY: Family History  Problem Relation Age of Onset   Cancer Sister    Diabetes Mother    Cancer Maternal Grandmother    Cancer Paternal Grandmother     ALLERGIES:  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  MEDICATIONS:  Current Outpatient Medications  Medication Sig Dispense Refill   acetaminophen (TYLENOL) 325 MG tablet Take 650 mg by mouth every 6 (six) hours as needed.     amoxicillin-clavulanate (AUGMENTIN) 875-125 MG tablet Take 1 tablet by mouth every 12 (twelve) hours for 7 days. 14 tablet 0   feeding supplement (ENSURE ENLIVE / ENSURE PLUS) LIQD Take 237 mLs by mouth daily. 237 mL 12   ferrous sulfate 325 (65 FE) MG tablet Take 1 tablet (325 mg total) by mouth 2 (two) times daily with a meal. 60 tablet 3   HYDROcodone-acetaminophen (NORCO) 5-325 MG tablet Take 1 tablet by mouth every 6 (six) hours as needed for moderate pain. 60 tablet 0   ibuprofen (ADVIL) 400 MG tablet Take 1 tablet (400 mg total) by mouth every 6 (six) hours as needed. 30 tablet 0   nicotine (NICODERM CQ - DOSED IN MG/24 HOURS) 21 mg/24hr  patch Place 1 patch (21 mg total) onto the skin daily. 28 patch 0   senna-docusate (SENOKOT-S) 8.6-50 MG tablet Take 2 tablets by mouth daily. 120 tablet 0   No current facility-administered medications for this visit.    PHYSICAL EXAMINATION: ECOG PERFORMANCE STATUS: 1 - Symptomatic but completely ambulatory There were no vitals filed for this visit.  There were no vitals filed for this visit.   Physical Exam Constitutional:      General: He is not in acute distress.    Appearance: He is well-developed.     Comments: Laying on his side on sofa. Uncomfortable appearing. Accompanied.   HENT:     Head: Normocephalic and atraumatic.     Nose: Nose normal.     Mouth/Throat:     Pharynx: No oropharyngeal exudate.  Eyes:     General: No scleral icterus.    Conjunctiva/sclera: Conjunctivae normal.  Cardiovascular:     Rate and Rhythm: Normal rate and regular rhythm.     Heart sounds: Normal heart sounds.  Pulmonary:     Effort: Pulmonary effort is normal. No respiratory distress.     Breath sounds: Normal breath sounds. No  wheezing.  Abdominal:     General: Bowel sounds are normal. There is no distension.     Palpations: Abdomen is soft.     Tenderness: There is no abdominal tenderness.  Musculoskeletal:        General: No deformity. Normal range of motion.     Cervical back: Normal range of motion and neck supple.  Skin:    General: Skin is warm and dry.     Findings: No erythema or rash.  Neurological:     Mental Status: He is alert and oriented to person, place, and time. Mental status is at baseline.     Cranial Nerves: No cranial nerve deficit.     Coordination: Coordination normal.  Psychiatric:        Mood and Affect: Mood normal.        Behavior: Behavior normal.  JP drain noted in left gluteus with overlying gauze and tegaderm in place. 1 cm skin opening left mid gluteus near sacrum- surrounding induration, no erythema, or warmth; minimal drainage.   LABORATORY  DATA:  I have reviewed the data as listed Lab Results  Component Value Date   WBC 12.6 (H) 02/21/2021   HGB 8.4 (L) 02/21/2021   HCT 26.8 (L) 02/21/2021   MCV 68.9 (L) 02/21/2021   PLT 955 (HH) 02/21/2021   Recent Labs    02/15/21 0429 02/16/21 0424 02/20/21 1102 02/21/21 1331  NA 130* 132* 137 134*  K 4.4 4.2 4.4 4.3  CL 95* 98 100 98  CO2 26 26 27 27   GLUCOSE 124* 117* 96 198*  BUN 10 9 12 14   CREATININE 0.95 0.96 0.74 0.93  CALCIUM 8.2* 8.1* 8.6* 8.7*  GFRNONAA >60 >60 >60 >60  PROT 7.8  --  7.9 8.2*  ALBUMIN 2.3*  --  2.2* 2.3*  AST 19  --  13* 14*  ALT 19  --  21 19  ALKPHOS 85  --  88 86  BILITOT 0.8  --  0.3 <0.1*    Iron/TIBC/Ferritin/ %Sat    Component Value Date/Time   IRON 9 (L) 02/05/2021 1309   TIBC 378 02/05/2021 1309   FERRITIN 22 (L) 02/05/2021 1309   IRONPCTSAT 2 (L) 02/05/2021 1309      RADIOGRAPHIC STUDIES: I have personally reviewed the radiological images as listed and agreed with the findings in the report. CT ABDOMEN PELVIS WO CONTRAST  Addendum Date: 02/15/2021   ADDENDUM REPORT: 02/15/2021 10:27 ADDENDUM: Signs of potential fishbone in the colon well upstream from the rectal mass. Suggest attention on follow-up as these can occasionally lead to bowel perforation, this is an unrelated finding and not currently associated with inflammation. (Image 82/6 and image 25/5) These results will be called to the ordering clinician or representative by the Radiologist Assistant, and communication documented in the PACS or [employer]. Electronically Signed   By: Zetta Bills M.D.   On: 02/15/2021 10:27   Result Date: 02/15/2021 CLINICAL DATA:  New rectal cancer with severe pain. EXAM: CT ABDOMEN AND PELVIS WITHOUT CONTRAST TECHNIQUE: Multidetector CT imaging of the abdomen and pelvis was performed following the standard protocol without IV contrast. COMPARISON:  PET CT that was performed on February 13, 2021. FINDINGS: Lower chest: Incidental imaging of  the lung bases is unremarkable. Hepatobiliary: Small low-density lesion in the RIGHT hepatic lobe (image 9/2 6 mm, likely a small cyst. Small cyst along the RIGHT hepatic margin. No pericholecystic stranding. No gross biliary duct distension. Pancreas: Pancreas with mild atrophy.  No sign of adjacent inflammation. Spleen: Spleen normal size and contour. Adrenals/Urinary Tract: Adrenal glands are normal. Symmetric smooth contour of bilateral kidneys. No hydronephrosis. No nephrolithiasis. Urinary bladder with smooth contours but extending into the lower abdomen with moderate to marked distension. Stomach/Bowel: Stomach without evidence of adjacent stranding. No small bowel dilation. Normal appendix. Colon is stool filled without signs of obstruction. At the descending/sigmoid junction there is a linear area of increased calcific density within the fecal stream potentially a small fishbone. No adjacent stranding or free air. Distal to this is the large irregular colonic tumor with increasing size of a horseshoe shaped area of low attenuation about the rectum. Small fluid collection lateral to the pubic 0 wreck talus muscle on image 82 of series 2 contains gas and has developed in the short interval since the study of Feb 05, 2021, quite subtle on the PET scan of only 1 day ago now containing gas, not containing gas on the recent PET exam. Small amount a of gas also seen in the fluid density that tracks into the perianal region, potentially within the sphincter complex in the intersphincteric plane though not clearly delineated on the current study bulging normal anatomy along the region of the sphincter complex. Ulcerated and irregular circumferential mass. Fluid begins above the pelvic floor and tracks below the pelvic floor along with LEFT ischial rectal fossa collection described above. Associated nodal enlargement in the RIGHT hemipelvis as as outlined on the previous PET exam along with hypogastric lymph nodes and  extensive mesorectal soft tissue. Inguinal lymph nodes with enlargement, potentially reactive given above findings. No free intraperitoneal air. Vascular/Lymphatic: No retroperitoneal adenopathy. Calcified atheromatous plaque of the abdominal aorta. No aneurysmal dilation. Reproductive: Inseparable from the process in the mesorectum. Other: No free air.  No ascites. Musculoskeletal: No acute bone finding or destructive bone process. Spinal degenerative changes. IMPRESSION: Signs of presumed perforated rectal mass with contained perforation with fluid and gas extending above and below the pelvic floor, potentially involving the sphincter complex and extending into LEFT ischial rectal fossa. GI and or surgical consultation may be helpful for further evaluation. Fluid and gas has developed since the Feb 06, 2020 evaluation where there was only minimal low-attenuation outside of the rectum. Signs of nodal disease in the pelvis is outlined on the PET exam. Electronically Signed: By: Zetta Bills M.D. On: 02/14/2021 13:38   CT ABDOMEN PELVIS WO CONTRAST  Result Date: 02/05/2021 CLINICAL DATA:  Elevated white blood count. EXAM: CT ABDOMEN AND PELVIS WITHOUT CONTRAST TECHNIQUE: Multidetector CT imaging of the abdomen and pelvis was performed following the standard protocol without IV contrast. COMPARISON:  Jan 08, 2020. FINDINGS: Lower chest: No acute abnormality. Hepatobiliary: No gallstones or biliary dilatation is noted. Grossly stable 1 cm low density is noted along inferior portion of right hepatic lobe. Stable probable small cyst is seen posteriorly in the right hepatic lobe. Enhancing abnormality seen on prior exam in left hepatic lobe is not visualized currently due to the lack of intravenous contrast administration. Pancreas: Unremarkable. No pancreatic ductal dilatation or surrounding inflammatory changes. Spleen: Normal in size without focal abnormality. Adrenals/Urinary Tract: Adrenal glands are  unremarkable. Kidneys are normal, without renal calculi, focal lesion, or hydronephrosis. Bladder is unremarkable. Stomach/Bowel: The stomach appears normal. The appendix appears normal. There is no evidence of bowel obstruction. There appears to be significantly increased wall thickening involving the rectum with a significantly increased amount of inflammatory change in the perirectal soft tissues. This is concerning for  rectal malignancy or possibly severe proctitis. Stable linear high density is seen adjacent to sigmoid colon concerning for ingested bone fragment. Vascular/Lymphatic: Aortic atherosclerosis. 3.1 x 2.3 cm right internal iliac lymph node is noted which is new since prior exam and highly concerning for metastatic disease. Reproductive: Prostate is unremarkable. Other: No abdominal wall hernia or abnormality. No abdominopelvic ascites. Musculoskeletal: No acute or significant osseous findings. IMPRESSION: Severe rectal wall thickening is noted with surrounding inflammation which is significantly worse compared to prior exam. There also appears to be a 3.1 x 2.3 cm right internal iliac lymph node which is concerning for metastatic disease. These findings are concerning for rectal malignancy or severe proctitis. Sigmoidoscopy is recommended. Enhancing abnormality seen in left hepatic lobe on prior exam is not visualized currently due to lack of intravenous contrast. Electronically Signed   By: Marijo Conception M.D.   On: 02/05/2021 11:30   CT PELVIS W CONTRAST  Result Date: 02/20/2021 CLINICAL DATA:  55 year old with rectal cancer with a percutaneous drain. Patient complains of fluid leaking around the drain. EXAM: CT PELVIS WITH CONTRAST TECHNIQUE: Multidetector CT imaging of the pelvis was performed using the standard protocol following the bolus administration of intravenous contrast. CONTRAST:  151mL OMNIPAQUE IOHEXOL 300 MG/ML  SOLN COMPARISON:  CT 02/14/2021 FINDINGS: Urinary Tract:  Fluid-filled urinary bladder. Right kidney is only partially imaged. Bowel: Known large rectal mass with circumferential thickening in the rectum and anal region. No significant dilatation of the sigmoid colon or other visualized portions of the colon. Normal caliber of the visualized small bowel. Vascular/Lymphatic: Extensive atherosclerotic calcifications. Again noted is low-density nodule or lymph node in the along the right pelvic wall that measures 3.1 x 2.5 cm on sequence 5 image 31. Prominent inguinal lymph nodes bilaterally. Reproductive:  Prostate is poorly visualized. Other: Percutaneous drain was placed in the perirectal/perianal abscess on 02/15/2021. The large perirectal abscess has markedly decreased in size. Small residual pocket of gas and fluid just inferior to the drain on sequence 5, image 48 that measures 3.0 x 2.4 x 2.2 cm. There is new gas in the subcutaneous tissues of the medial left buttock and there is concern for an ulceration or wound in this area. Scattered gas-filled collections along the medial left buttock and perirectal region. Cannot exclude a fistula connection between the rectal mass and the subcutaneous gas-filled collections. Musculoskeletal: No acute bone abnormality. IMPRESSION: 1. Large perirectal/perianal abscess has markedly decreased in size since placement of the percutaneous drain. There are residual gas-filled collections in the soft tissues and suspect there is a fistula or sinus tract between the rectal mass and the subcutaneous tissues. 2. Soft tissue gas along the medial left buttock and concern for a cutaneous ulceration in this area. 3. Large rectal mass with evidence for a large necrotic right pelvic lymph node. Electronically Signed   By: Markus Daft M.D.   On: 02/20/2021 15:42   NM PET Image Initial (PI) Skull Base To Thigh  Addendum Date: 02/14/2021   ADDENDUM REPORT: 02/14/2021 13:43 ADDENDUM: For clarification the comparison from May 2nd is from 2021.  Nodal disease that is present on the current study and the most recent comparison is not visible on the previous more remote CT assessment. Nor is the fluid density in the area of the mesorectum that tracks above and below the pelvic floor as described. Additionally, there is a spiculated nodule in the LEFT upper lobe with morphologic features that raise the question of small bronchogenic neoplasm measuring  9 x 8 mm (8 x 8 mm in the initial report). Referral to multi disciplinary thoracic oncologic setting may be helpful for further assessment. In the current context could consider short interval follow-up or biopsy for further assessment as warranted. These results will be called to the ordering clinician or representative by the Radiologist Assistant, and communication documented in the PACS or [employer]. Electronically Signed   By: Zetta Bills M.D.   On: 02/14/2021 13:43   Addendum Date: 02/14/2021   ADDENDUM REPORT: 02/14/2021 13:21 ADDENDUM: These results were called to the ordering clinician or representative by the Radiologist Assistant, and communication documented in the PACS or [employer]. Electronically Signed   By: Zetta Bills M.D.   On: 02/14/2021 13:21   Result Date: 02/14/2021 CLINICAL DATA:  Initial treatment strategy for rectal cancer in a 55 year old male. Patient reports severe pain in symptoms that made the examination challenging. EXAM: NUCLEAR MEDICINE PET SKULL BASE TO THIGH TECHNIQUE: 7.97 mCi F-18 FDG was injected intravenously. Full-ring PET imaging was performed from the skull base to thigh after the radiotracer. CT data was obtained and used for attenuation correction and anatomic localization. Fasting blood glucose: 107 mg/dl COMPARISON:  CT of the abdomen and pelvis Feb 05, 2021 and CT of the abdomen and pelvis Jan 07, 2021. FINDINGS: Mediastinal blood pool activity: SUV max 2.05 Liver activity: SUV max NA NECK: Asymmetric muscular activity in the neck in  prevertebral musculature. LEFT over RIGHT without visible lesion. Incidental CT findings: none CHEST: Spiculated nodule in the LEFT upper lobe measures approximately 8 x 8 mm with some surrounding spiculation (image 06/3) metabolic activity not elevated above mediastinal blood pool. Signs of pulmonary emphysema. Airways are patent. No adenopathy in the chest or signs of suspicious nodule elsewhere. Incidental CT findings: Calcified atherosclerotic plaque of the thoracic aorta. 4 cm caliber of the ascending thoracic aorta. Minimal scattered atherosclerotic plaque with calcification. Heart size normal without pericardial effusion. Normal caliber central pulmonary vessels. Normal appearance of the esophagus. ABDOMEN/PELVIS: No signs of solid organ metastasis. Cyst in the RIGHT hepatic lobe. Marked hypermetabolic activity about the patient's known rectal mass. This mass appears less well-defined. The mesorectum is filled with low attenuation to the LEFT posterolateral aspect of the mesorectum with crescentic appearance tracking into the upper sphincter complex, horseshoe shaped low attenuation surrounds the rectum and anus as it extends towards the pelvic floor. Mass measuring approximately 6.5 x 4.9 cm (image 248/3) maximum SUV 17.6. Lenticular area of low attenuation with some peripheral increased metabolic activity, more nodular along the LEFT posterolateral margin on image 257 of series 3. This area measures approximately 2.7 cm in greatest thickness and surrounds the rectum and anus approximately 270 degrees of its circumference above the pelvic floor and at the pelvic floor slightly less. Bulky RIGHT pelvic sidewall/hypogastric lymph node outside of the mesorectum with stippled calcification measuring 19 mm and without signs of increased metabolic activity. Small LEFT hypogastric lymph node just peripheral to the internal external bifurcation on image 228 of series 3 displays low level FDG uptake with a maximum  SUV of 3.2. High RIGHT internal just below or at the internal/common iliac transition, lymph node (image 214/3) 12 mm maximum SUV of 4.8. LEFT high hypogastric lymph node (image 217/3) 8 mm with low level FDG uptake, maximum SUV of 3.0. Scattered lymph nodes throughout the retroperitoneum with FDG uptake only slightly above mediastinal blood pool. For instance on image 181 of series 3 is an 8 mm lymph  node with a maximum SUV of 2.2 along the LEFT periaortic chain. Bilateral inguinal lymph nodes largest on the RIGHT (image 248/3) 11 mm with a maximum SUV of 2.8 Primary tumor difficult to assess in terms of margins due to surrounding stranding which has worsened since previous imaging. Incidental CT findings: Tiny hypodensity in the RIGHT hemi liver (image 138/3) no focal increased FDG uptake though this 4 mm area may be below PET resolution in this context. SKELETON: Diffuse increased metabolic activity throughout the visualized skeleton without focal area of activity to suggest bony metastasis. Incidental CT findings: none IMPRESSION: Signs of locally advanced rectal cancer with ballooning of the mesorectum now with low attenuation material that was not present on previous examinations (not present on 01-15-23 and much less pronounced on 02-13-23) with extensive stranding and inflammation. Findings suspicious for rectal compromise/focal defect in the rectum at the site of tumor or along the LEFT posterolateral aspect and contained inflammation in the mesorectum. Rectal tumor also appears bulkier in general and part of this could be due to marked worsening in short interval of rectal tumor. Large lymph node along the RIGHT pelvic sidewall and smaller lymph nodes as outlined above in the pelvis outside of the mesorectum including inguinal lymph nodes which are suspicious for disease. Note that the dominant lymph node displays little FDG uptake, potentially due to necrosis. Lymph nodes in the groin are equivocal in the  setting of extensive inflammation and potentially reactive showing enlargement since previous imaging from 01-15-23. No signs of solid organ metastasis. Diffuse marrow uptake likely related to symptomatic anemia. Electronically Signed: By: Zetta Bills M.D. On: 02/14/2021 08:58   CT IMAGE GUIDED DRAINAGE BY PERCUTANEOUS CATHETER  Result Date: 02/15/2021 INDICATION: 55 year old gentleman with perirectal abscess presents to IR for CT-guided drain placement EXAM: CT GUIDED DRAINAGE OF PERIRECTAL ABSCESS MEDICATIONS: The patient is currently admitted to the hospital and receiving intravenous antibiotics. The antibiotics were administered within an appropriate time frame prior to the initiation of the procedure. ANESTHESIA/SEDATION: Two mg IV Versed 100 mcg IV Fentanyl Moderate Sedation Time:  10 minutes The patient was continuously monitored during the procedure by the interventional radiology nurse under my direct supervision. COMPLICATIONS: None immediate. TECHNIQUE: Informed written consent was obtained from the patient after a thorough discussion of the procedural risks, benefits and alternatives. All questions were addressed. Maximal Sterile Barrier Technique was utilized including caps, mask, sterile gowns, sterile gloves, sterile drape, hand hygiene and skin antiseptic. A timeout was performed prior to the initiation of the procedure. PROCEDURE: Patient position prone on the procedure table. The posterior pelvic wall skin prepped and draped in usual fashion. Following local lidocaine administration, 18 gauge needle advanced into the perirectal fluid collection utilizing CT guidance. 18 gauge needle exchanged for 10 [place] multipurpose pigtail drain over 0.035 inch guidewire. 50 mL purulent sample aspirated and sent for Gram stain and culture. Drain secured to skin with suture and connected to bulb suction. Post placement CT confirmed appropriate placement of the drain. IMPRESSION: 10 [place] multipurpose  pigtail drain placed in perirectal abscess. Electronically Signed   By: Miachel Roux M.D.   On: 02/15/2021 15:24       ASSESSMENT & PLAN:  No diagnosis found. Cancer Staging Rectal cancer [facility]) Staging form: Colon and Rectum, AJCC 8th Edition - Clinical stage from 02/08/2021: Stage Unknown (cTX, cN1, cM0) - Signed by Earlie Server, MD on 02/08/2021  Rectal cancer - Discussed imaging with IR. Perirectal abscess has decompressed. Ok  to remove JP drain. Drain removed. Tip intact. WBC down trending but new drainage area. Recommend patient continue augmentin. Refilled for another 7 days. Discussed with patient and Dr. Tasia Catchings that patient will benefit from seeing colorectal surgeon prior to chemotherapy. Patient would like to keep care locally and will be referred to [facility]. Mariea Clonts, RN coordinating care.  Anemia- iron deficiency. Proc with venofer today as planned Chemo education- will send message to scheduling to have patient rescheduled for chemo class and port placement.  Plan:  Follow up if symptoms do not improve or worsen in the interim. Dr. Tasia Catchings to update her team.   No orders of the defined types were placed in this encounter.   All questions were answered. The patient knows to call the clinic with any problems questions or concerns.  Thank you for this kind referral and the opportunity to participate in the care of this patient.

## 2021-02-21 NOTE — Progress Notes (Signed)
1400: Per Beckey Rutter NP proceed with Venofer at this time. Pt will see Beckey Rutter NP in MD office exam room post venofer with spouse.

## 2021-02-22 ENCOUNTER — Encounter: Payer: Self-pay | Admitting: Nurse Practitioner

## 2021-02-22 ENCOUNTER — Other Ambulatory Visit: Payer: Self-pay

## 2021-02-22 ENCOUNTER — Other Ambulatory Visit: Payer: Self-pay | Admitting: Nurse Practitioner

## 2021-02-22 ENCOUNTER — Telehealth: Payer: Self-pay

## 2021-02-22 ENCOUNTER — Encounter: Payer: Self-pay | Admitting: Oncology

## 2021-02-22 ENCOUNTER — Telehealth: Payer: Self-pay | Admitting: Oncology

## 2021-02-22 DIAGNOSIS — K631 Perforation of intestine (nontraumatic): Secondary | ICD-10-CM

## 2021-02-22 DIAGNOSIS — C20 Malignant neoplasm of rectum: Secondary | ICD-10-CM

## 2021-02-22 MED ORDER — AMOXICILLIN-POT CLAVULANATE 875-125 MG PO TABS: 1.0000 | ORAL_TABLET | Freq: Two times a day (BID) | ORAL | 0 refills | Status: DC

## 2021-02-22 MED ORDER — AMOXICILLIN-POT CLAVULANATE 875-125 MG PO TABS: 1.0000 | ORAL_TABLET | Freq: Two times a day (BID) | ORAL | 0 refills | Status: AC

## 2021-02-22 NOTE — Telephone Encounter (Signed)
Left VM with patient to notify him of scheduled MRI. Requested he call back to schedule 2 doses of IV iron per Dr. Tasia Catchings.

## 2021-02-22 NOTE — Telephone Encounter (Signed)
Per secure chat from Dr. Tasia Catchings:  patient has question about abbreviated chemo education? his [name] edu is on 6/28, can he do earlier than that?   when is he going to get medi port placed  Kristi, please refer him to establish care with colorectal surgeon in [place]. he may have appt with local surgeon, but I do want him to see colorectal surgeon too.   please schedule him for additional IV venofer x 2 doses next week if he agrees.  please reschedule his MR pelvis wo contrast- rectal cancer protocol.   I have sent message to Dr. Bunnie Domino office to check on the staus of rescheduling port placement. Krisit has sent referral to Dr. Dema Severin at [place]. MRI has been r/s and jennfer left VM to notify pt to call back and let us know if he would like to proceed with iron infusions.

## 2021-02-25 ENCOUNTER — Encounter: Payer: Self-pay | Admitting: Oncology

## 2021-02-25 ENCOUNTER — Other Ambulatory Visit (HOSPITAL_COMMUNITY): Payer: Self-pay

## 2021-02-25 ENCOUNTER — Telehealth (INDEPENDENT_AMBULATORY_CARE_PROVIDER_SITE_OTHER): Payer: Self-pay

## 2021-02-25 NOTE — Telephone Encounter (Signed)
Pt scheduled for Port placement on 6/22.

## 2021-02-25 NOTE — Telephone Encounter (Signed)
Spoke with the patient and he is scheduled with Dr. Lucky Cowboy for a port placement on 02/27/21 with a 6:45 am arrival time to the MM. Pre-procedure instructions were discussed and patient understood.

## 2021-02-27 ENCOUNTER — Encounter: Payer: Self-pay | Admitting: Vascular Surgery

## 2021-02-27 ENCOUNTER — Encounter
Admission: RE | Disposition: A | Discharge status: Home / Self Care | Payer: Self-pay | Source: Home / Self Care | Attending: Vascular Surgery

## 2021-02-27 ENCOUNTER — Other Ambulatory Visit (INDEPENDENT_AMBULATORY_CARE_PROVIDER_SITE_OTHER): Payer: Self-pay | Admitting: Nurse Practitioner

## 2021-02-27 ENCOUNTER — Ambulatory Visit
Admission: RE | Admit: 2021-02-27 | Discharge: 2021-02-27 | Disposition: A | Discharge status: Home / Self Care | Payer: Self-pay | Attending: Vascular Surgery | Admitting: Vascular Surgery

## 2021-02-27 ENCOUNTER — Other Ambulatory Visit: Payer: Self-pay

## 2021-02-27 DIAGNOSIS — Z91013 Allergy to seafood: Secondary | ICD-10-CM | POA: Insufficient documentation

## 2021-02-27 DIAGNOSIS — Z87891 Personal history of nicotine dependence: Secondary | ICD-10-CM | POA: Insufficient documentation

## 2021-02-27 DIAGNOSIS — C20 Malignant neoplasm of rectum: Secondary | ICD-10-CM | POA: Insufficient documentation

## 2021-02-27 HISTORY — PX: PORTA CATH INSERTION: CATH118285

## 2021-02-27 SURGERY — PORTA CATH INSERTION
Anesthesia: Moderate Sedation

## 2021-02-27 MED ORDER — FENTANYL CITRATE (PF) 100 MCG/2ML IJ SOLN
INTRAMUSCULAR | Status: AC
  Filled 2021-02-27: qty 2

## 2021-02-27 MED ORDER — MIDAZOLAM HCL 2 MG/ML PO SYRP: 8.0000 mg | ORAL_SOLUTION | Freq: Once | ORAL | Status: DC | PRN

## 2021-02-27 MED ORDER — SODIUM CHLORIDE 0.9 % IV SOLN: INTRAVENOUS | Status: DC

## 2021-02-27 MED ORDER — FENTANYL CITRATE (PF) 100 MCG/2ML IJ SOLN
INTRAMUSCULAR | Status: DC | PRN
  Administered 2021-02-27: 50 ug via INTRAVENOUS

## 2021-02-27 MED ORDER — METHYLPREDNISOLONE SODIUM SUCC 125 MG IJ SOLR: 125.0000 mg | Freq: Once | INTRAMUSCULAR | Status: AC | PRN

## 2021-02-27 MED ORDER — FAMOTIDINE 20 MG PO TABS
ORAL_TABLET | ORAL | Status: AC
  Administered 2021-02-27: 40 mg via ORAL
  Filled 2021-02-27: qty 2

## 2021-02-27 MED ORDER — DIPHENHYDRAMINE HCL 50 MG/ML IJ SOLN: 50.0000 mg | Freq: Once | INTRAMUSCULAR | Status: AC | PRN

## 2021-02-27 MED ORDER — ONDANSETRON HCL 4 MG/2ML IJ SOLN: 4.0000 mg | Freq: Four times a day (QID) | INTRAMUSCULAR | Status: DC | PRN

## 2021-02-27 MED ORDER — CHLORHEXIDINE GLUCONATE CLOTH 2 % EX PADS
6.0000 | MEDICATED_PAD | Freq: Every day | CUTANEOUS | Status: DC
  Administered 2021-02-27: 6 via TOPICAL

## 2021-02-27 MED ORDER — METHYLPREDNISOLONE SODIUM SUCC 125 MG IJ SOLR
INTRAMUSCULAR | Status: AC
  Administered 2021-02-27: 125 mg via INTRAVENOUS
  Filled 2021-02-27: qty 2

## 2021-02-27 MED ORDER — FAMOTIDINE 20 MG PO TABS: 40.0000 mg | ORAL_TABLET | Freq: Once | ORAL | Status: AC | PRN

## 2021-02-27 MED ORDER — CEFAZOLIN SODIUM-DEXTROSE 2-4 GM/100ML-% IV SOLN
INTRAVENOUS | Status: AC
  Filled 2021-02-27: qty 100

## 2021-02-27 MED ORDER — HYDROMORPHONE HCL 1 MG/ML IJ SOLN: 1.0000 mg | Freq: Once | INTRAMUSCULAR | Status: DC | PRN

## 2021-02-27 MED ORDER — DIPHENHYDRAMINE HCL 50 MG/ML IJ SOLN
INTRAMUSCULAR | Status: AC
  Administered 2021-02-27: 50 mg via INTRAVENOUS
  Filled 2021-02-27: qty 1

## 2021-02-27 MED ORDER — MIDAZOLAM HCL 2 MG/2ML IJ SOLN
INTRAMUSCULAR | Status: DC | PRN
  Administered 2021-02-27: 2 mg via INTRAVENOUS

## 2021-02-27 MED ORDER — CEFAZOLIN SODIUM-DEXTROSE 2-4 GM/100ML-% IV SOLN
2.0000 g | Freq: Once | INTRAVENOUS | Status: AC
  Administered 2021-02-27: 2 g via INTRAVENOUS

## 2021-02-27 MED ORDER — MIDAZOLAM HCL 2 MG/2ML IJ SOLN
INTRAMUSCULAR | Status: AC
  Filled 2021-02-27: qty 2

## 2021-02-27 MED ORDER — SODIUM CHLORIDE 0.9 % IV SOLN
Freq: Once | INTRAVENOUS | Status: AC
  Filled 2021-02-27: qty 2

## 2021-02-27 SURGICAL SUPPLY — 13 items
ADH SKN CLS APL DERMABOND .7 (GAUZE/BANDAGES/DRESSINGS) ×1
COVER PROBE U/S 5X48 (MISCELLANEOUS) ×1 IMPLANT
COVER SURGICAL LIGHT HANDLE (MISCELLANEOUS) ×1 IMPLANT
DERMABOND ADVANCED (GAUZE/BANDAGES/DRESSINGS) ×1
DERMABOND ADVANCED .7 DNX12 (GAUZE/BANDAGES/DRESSINGS) IMPLANT
KIT PORT POWER 8FR ISP CVUE (Port) ×1 IMPLANT
PACK ANGIOGRAPHY (CUSTOM PROCEDURE TRAY) ×2 IMPLANT
PENCIL ELECTRO HAND CTR (MISCELLANEOUS) ×1 IMPLANT
SPONGE XRAY 4X4 16PLY STRL (MISCELLANEOUS) ×1 IMPLANT
SUT MNCRL AB 4-0 PS2 18 (SUTURE) ×1 IMPLANT
SUT VIC AB 3-0 SH 27 (SUTURE) ×2
SUT VIC AB 3-0 SH 27X BRD (SUTURE) IMPLANT
TOWEL OR 17X26 4PK STRL BLUE (TOWEL DISPOSABLE) ×1 IMPLANT

## 2021-02-27 NOTE — Interval H&P Note (Signed)
History and Physical Interval Note:  02/27/2021 8:16 AM  [name].  has presented today for surgery, with the diagnosis of Porta Cath Insertion  Rectal Ca.  The various methods of treatment have been discussed with the patient and family. After consideration of risks, benefits and other options for treatment, the patient has consented to  Procedure(s): PORTA CATH INSERTION (N/A) as a surgical intervention.  The patient's history has been reviewed, patient examined, no change in status, stable for surgery.  I have reviewed the patient's chart and labs.  Questions were answered to the patient's satisfaction.     Leotis Pain

## 2021-02-27 NOTE — Op Note (Signed)
      [facility]       Operative Note  Date: 02/27/2021  Preoperative diagnosis:  1. Rectal cancer  Postoperative diagnosis:  Same as above  Procedures: #1. Ultrasound guidance for vascular access to the right internal jugular vein. #2. Fluoroscopic guidance for placement of catheter. #3. Placement of CT compatible Port-A-Cath, right internal jugular vein.  Surgeon: Leotis Pain, MD.   Anesthesia: Local with moderate conscious sedation for approximately 21  minutes using 2 mg of Versed and 50 mcg of Fentanyl  Fluoroscopy time: less than 1 minute  Contrast used: 0  Estimated blood loss: 3 cc  Indication for the procedure:  The patient is a 55 y.o.male with rectal cancer.  The patient needs a Port-A-Cath for durable venous access, chemotherapy, lab draws, and CT scans. We are asked to place this. Risks and benefits were discussed and informed consent was obtained.  Description of procedure: The patient was brought to the vascular and interventional radiology suite.  Moderate conscious sedation was administered throughout the procedure during a face to face encounter with the patient with my supervision of the RN administering medicines and monitoring the patient's vital signs, pulse oximetry, telemetry and mental status throughout from the start of the procedure until the patient was taken to the recovery room. The right neck chest and shoulder were sterilely prepped and draped, and a sterile surgical field was created. Ultrasound was used to help visualize a patent right internal jugular vein. This was then accessed under direct ultrasound guidance without difficulty with the Seldinger needle and a permanent image was recorded. A J-wire was placed. After skin nick and dilatation, the peel-away sheath was then placed over the wire. I then anesthetized an area under the clavicle approximately 1-2 fingerbreadths. A transverse incision was created and an inferior pocket was  created with electrocautery and blunt dissection. The port was then brought onto the field, placed into the pocket and secured to the chest wall with 2 Prolene sutures. The catheter was connected to the port and tunneled from the subclavicular incision to the access site. Fluoroscopic guidance was then used to cut the catheter to an appropriate length. The catheter was then placed through the peel-away sheath and the peel-away sheath was removed. The catheter tip was parked in excellent location under fluorocoscopic guidance in the SVC just above the right atrium. The pocket was then irrigated with antibiotic impregnated saline and the wound was closed with a running 3-0 Vicryl and a 4-0 Monocryl. The access incision was closed with a single 4-0 Monocryl. The Huber needle was used to withdraw blood and flush the port with heparinized saline. Dermabond was then placed as a dressing. The patient tolerated the procedure well and was taken to the recovery room in stable condition.   Leotis Pain 02/27/2021 8:54 AM   This note was created with Dragon Medical transcription system. Any errors in dictation are purely unintentional.

## 2021-03-01 ENCOUNTER — Encounter: Payer: Self-pay | Admitting: Oncology

## 2021-03-01 ENCOUNTER — Encounter: Payer: Self-pay | Admitting: Nurse Practitioner

## 2021-03-03 ENCOUNTER — Encounter: Payer: Self-pay | Admitting: Nurse Practitioner

## 2021-03-04 ENCOUNTER — Other Ambulatory Visit: Payer: Self-pay | Admitting: Nurse Practitioner

## 2021-03-04 ENCOUNTER — Telehealth: Payer: Self-pay

## 2021-03-04 ENCOUNTER — Encounter: Payer: Self-pay | Admitting: Oncology

## 2021-03-04 MED ORDER — AMOXICILLIN-POT CLAVULANATE 875-125 MG PO TABS: 1.0000 | ORAL_TABLET | Freq: Two times a day (BID) | ORAL | 0 refills | Status: DC

## 2021-03-04 NOTE — Telephone Encounter (Signed)
Pt scheduled for lab/MD on 6/29

## 2021-03-04 NOTE — Telephone Encounter (Signed)
Received communication from Dr. Dwaine Gale that if drain has been removed he does not need to keep appointments 6/28 for evaluation at [facility]. Called and notified [name] and [facility] radiology of cancellation.

## 2021-03-04 NOTE — Telephone Encounter (Signed)
Please contact pt and schedule lab/MD this week. He is scheduled for chemo class tomorrow, but I think he has another appt.

## 2021-03-05 ENCOUNTER — Other Ambulatory Visit: Payer: Self-pay

## 2021-03-05 ENCOUNTER — Inpatient Hospital Stay: Payer: Self-pay

## 2021-03-05 ENCOUNTER — Inpatient Hospital Stay: Admission: RE | Admit: 2021-03-05 | Payer: Self-pay | Source: Ambulatory Visit

## 2021-03-05 DIAGNOSIS — D500 Iron deficiency anemia secondary to blood loss (chronic): Secondary | ICD-10-CM

## 2021-03-05 DIAGNOSIS — C20 Malignant neoplasm of rectum: Secondary | ICD-10-CM

## 2021-03-06 ENCOUNTER — Inpatient Hospital Stay: Payer: Self-pay

## 2021-03-06 ENCOUNTER — Encounter: Payer: Self-pay | Admitting: Oncology

## 2021-03-06 ENCOUNTER — Inpatient Hospital Stay (HOSPITAL_BASED_OUTPATIENT_CLINIC_OR_DEPARTMENT_OTHER): Payer: Self-pay | Admitting: Oncology

## 2021-03-06 VITALS — BP 124/80 | HR 102 | Temp 99.3°F | Resp 18 | Wt 152.0 lb

## 2021-03-06 DIAGNOSIS — R911 Solitary pulmonary nodule: Secondary | ICD-10-CM

## 2021-03-06 DIAGNOSIS — K631 Perforation of intestine (nontraumatic): Secondary | ICD-10-CM

## 2021-03-06 DIAGNOSIS — C20 Malignant neoplasm of rectum: Secondary | ICD-10-CM

## 2021-03-06 DIAGNOSIS — Z7189 Other specified counseling: Secondary | ICD-10-CM

## 2021-03-06 DIAGNOSIS — D500 Iron deficiency anemia secondary to blood loss (chronic): Secondary | ICD-10-CM

## 2021-03-06 DIAGNOSIS — D72829 Elevated white blood cell count, unspecified: Secondary | ICD-10-CM

## 2021-03-06 DIAGNOSIS — D75839 Thrombocytosis, unspecified: Secondary | ICD-10-CM

## 2021-03-06 LAB — COMPREHENSIVE METABOLIC PANEL
ALT: 15 U/L (ref 0–44)
AST: 14 U/L — ABNORMAL LOW (ref 15–41)
Albumin: 3 g/dL — ABNORMAL LOW (ref 3.5–5.0)
Alkaline Phosphatase: 78 U/L (ref 38–126)
Anion gap: 8 (ref 5–15)
BUN: 8 mg/dL (ref 6–20)
CO2: 28 mmol/L (ref 22–32)
Calcium: 8.8 mg/dL — ABNORMAL LOW (ref 8.9–10.3)
Chloride: 99 mmol/L (ref 98–111)
Creatinine, Ser: 0.88 mg/dL (ref 0.61–1.24)
GFR, Estimated: >60 mL/min (ref >60)
Glucose, Bld: 90 mg/dL (ref 70–99)
Potassium: 4 mmol/L (ref 3.5–5.1)
Sodium: 135 mmol/L (ref 135–145)
Total Bilirubin: 0.3 mg/dL (ref 0.3–1.2)
Total Protein: 8.1 g/dL (ref 6.5–8.1)

## 2021-03-06 LAB — CBC WITH DIFFERENTIAL/PLATELET
Abs Immature Granulocytes: 0.05 10*3/uL (ref 0.00–0.07)
Basophils Absolute: 0.1 10*3/uL (ref 0.0–0.1)
Basophils Relative: 0 %
Eosinophils Absolute: 0.4 10*3/uL (ref 0.0–0.5)
Eosinophils Relative: 3 %
HCT: 27.5 % — ABNORMAL LOW (ref 39.0–52.0)
Hemoglobin: 8.6 g/dL — ABNORMAL LOW (ref 13.0–17.0)
Immature Granulocytes: 0 %
Lymphocytes Relative: 16 %
Lymphs Abs: 2.1 10*3/uL (ref 0.7–4.0)
MCH: 23.2 pg — ABNORMAL LOW (ref 26.0–34.0)
MCHC: 31.3 g/dL (ref 30.0–36.0)
MCV: 74.1 fL — ABNORMAL LOW (ref 80.0–100.0)
Monocytes Absolute: 1.3 10*3/uL — ABNORMAL HIGH (ref 0.1–1.0)
Monocytes Relative: 10 %
Neutro Abs: 9.3 10*3/uL — ABNORMAL HIGH (ref 1.7–7.7)
Neutrophils Relative %: 71 %
Platelets: 526 10*3/uL — ABNORMAL HIGH (ref 150–400)
RBC: 3.71 MIL/uL — ABNORMAL LOW (ref 4.22–5.81)
RDW: 26.5 % — ABNORMAL HIGH (ref 11.5–15.5)
WBC: 13.2 10*3/uL — ABNORMAL HIGH (ref 4.0–10.5)
nRBC: 0 % (ref 0.0–0.2)

## 2021-03-06 LAB — IRON AND TIBC
Iron: 25 ug/dL — ABNORMAL LOW (ref 45–182)
Saturation Ratios: 8 % — ABNORMAL LOW (ref 17.9–39.5)
TIBC: 328 ug/dL (ref 250–450)
UIBC: 303 ug/dL

## 2021-03-06 LAB — FERRITIN: Ferritin: 84 ng/mL (ref 24–336)

## 2021-03-06 NOTE — Progress Notes (Signed)
Patient here for oncology follow-up appointment, expresses concerns of rectal pain

## 2021-03-06 NOTE — Progress Notes (Signed)
Hematology/Oncology follow up note [facility] Telephone:([phone] Fax:([phone]) [phone]   Patient Care Team: Pcp, No as PCP - General [name], RN as Oncology Nurse Navigator  REFERRING PROVIDER: Lorella Nimrod, MD  CHIEF COMPLAINTS/REASON FOR VISIT:  rectal cancer  HISTORY OF PRESENTING ILLNESS:   Barry Crable. is a  55 y.o.  male with PMH listed below was seen in consultation at the request of  Lorella Nimrod, MD  for evaluation of rectal cancer  02/05/2021-02/06/2021 patient was hospitalized due to generalized weakness, intermittent lightheadedness, weight loss and worsening constipation.  02/05/2021 CT abdomen showed concerning of severe rectal wall thickening and right internal iliac lymph node concerning for metastatic disease.  Patient was seen by gastroenterology and had colonoscopy which showed a circumferential fungating mass in the rectum.  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   INTERVAL HISTORY Barry Mcsweeney. is a 55 y.o. male who has above history reviewed by me today presents for follow up visit for management of rectal cancer. Problems and complaints are listed below:  Patient was admitted due to rectal bleeding and pain.  CT showed perirectal fluid collection concerning for infection with significant leukocytosis, anemia with hemoglobin of 6.8.  Patient received PRBC transfusion, IV antibiotics.  He underwent an IR guided placement of JP drain into the rectal abscess.  Discharged home with oral Augmentin. Patient presented to ER again on 02/20/2021 with new skin opening and draining from left gluteus.  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  JP drain has been removed.  Patient was continued on Augmentin.  Patient has been referred to establish care with colorectal surgeon.  MRI is scheduled tomorrow Patient has received IV Venofer treatments. Today he continues to have rectal area discomfort.  Pain  has improved.  Denies any fever or chills.  He describes his stool is solid soft.  No difficulty passing bowel movements.  Images 02/13/2021, PET scan showed locally advanced rectal cancer with billowing of the mesorectum now with low attenuation material that was not present on previous examination with extensive stranding and inflammation.   Bulky RIGHT pelvic sidewall/hypogastric lymph node outside of the mesorectum with stippled calcification measuring 19 mm-no increased metabolic activity. Small LEFT hypogastric lymph node just peripheral to the internal external bifurcation-SUV 3.2 High RIGHT internal just below or at the internal/common iliac transition, lymph node -SUV 4.8 LEFT high hypogastric lymph node  8 mm-SUV 3. Scattered lymph nodes throughout the retroperitoneum with low FDG uptake Bilateral inguinal lymph nodes largest on the RIGHT (image 248/3) 11 mm with a maximum SUV of 2.8  spiculated nodule in the LEFT upper lobe- 9 x 8 mm  02/14/2021, CT abdomen pelvis without contrast Showed perforated rectal mass with contained perforation with fluid and gas extending above and below the pelvic floor,potentially involving the sphincter complex and extending into LEFT ischial rectal fossa  02/20/2021, CT pelvis with contrast showed Large perirectal/perianal abscess has markedly decreased in size since placement of the percutaneous drain. There are residual gas-filled collections in the soft tissues and suspect there is a fistula or sinus tract between the rectal mass and the subcutaneous tissues. Soft tissue gas along the medial left buttock and concern for a cutaneous ulceration in this area. Large rectal mass with evidence for a large necrotic right pelvic lymph node.  Review of Systems  Constitutional:  Positive for fatigue and unexpected weight change. Negative for appetite change, chills and fever.  HENT:   Negative for  hearing loss and voice change.   Eyes:  Negative for eye problems and  icterus.  Respiratory:  Negative for chest tightness, cough and shortness of breath.   Cardiovascular:  Negative for chest pain and leg swelling.  Gastrointestinal:  Positive for blood in stool, constipation and rectal pain. Negative for abdominal distention and abdominal pain.  Endocrine: Negative for hot flashes.  Genitourinary:  Negative for difficulty urinating, dysuria and frequency.   Musculoskeletal:  Negative for arthralgias.  Skin:  Negative for itching and rash.  Neurological:  Negative for light-headedness and numbness.  Hematological:  Negative for adenopathy. Does not bruise/bleed easily.  Psychiatric/Behavioral:  Negative for confusion.    MEDICAL HISTORY:  Past Medical History:  Diagnosis Date   Headache    Left shoulder pain    Rectal cancer ([place])     SURGICAL HISTORY: Past Surgical History:  Procedure Laterality Date   COLONOSCOPY WITH PROPOFOL N/A 02/06/2021   Procedure: COLONOSCOPY WITH PROPOFOL;  Surgeon: Lin Landsman, MD;  Location: [facility] ENDOSCOPY;  Service: Gastroenterology;  Laterality: N/A;   PORTA CATH INSERTION N/A 02/27/2021   Procedure: PORTA CATH INSERTION;  Surgeon: Algernon Huxley, MD;  Location: [facility] LAB;  Service: Cardiovascular;  Laterality: N/A;   ROTATOR CUFF REPAIR Left     SOCIAL HISTORY: Social History   Socioeconomic History   Marital status: Married    Spouse name: Not on file   Number of children: Not on file   Years of education: Not on file   Highest education level: Not on file  Occupational History   Not on file  Tobacco Use   Smoking status: Former    Packs/day: 1.00    Years: 10.00    Pack years: 10.00    Types: Cigars, Cigarettes    Quit date: 02/04/2021    Years since quitting: 0.0   Smokeless tobacco: Never  Substance and Sexual Activity   Alcohol use: Not Currently   Drug use: No   Sexual activity: Not on file  Other Topics Concern   Not on file  Social History Narrative   Not on file   Social  Determinants of Health   Financial Resource Strain: Not on file  Food Insecurity: Not on file  Transportation Needs: Not on file  Physical Activity: Not on file  Stress: Not on file  Social Connections: Not on file  Intimate Partner Violence: Not on file    FAMILY HISTORY: Family History  Problem Relation Age of Onset   Cancer Sister    Diabetes Mother    Cancer Maternal Grandmother    Cancer Paternal Grandmother     ALLERGIES:  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  MEDICATIONS:  Current Outpatient Medications  Medication Sig Dispense Refill   acetaminophen (TYLENOL) 325 MG tablet Take 650 mg by mouth every 6 (six) hours as needed.     amoxicillin-clavulanate (AUGMENTIN) 875-125 MG tablet Take 1 tablet by mouth 2 (two) times daily. 20 tablet 0   feeding supplement (ENSURE ENLIVE / ENSURE PLUS) LIQD Take 237 mLs by mouth daily. 237 mL 12   ferrous sulfate 325 (65 FE) MG tablet Take 1 tablet (325 mg total) by mouth 2 (two) times daily with a meal. 60 tablet 3   HYDROcodone-acetaminophen (NORCO) 5-325 MG tablet Take 1 tablet by mouth every 6 (six) hours as needed for moderate pain. 60 tablet 0   ibuprofen (ADVIL) 400 MG tablet Take 1 tablet (400 mg total) by mouth every 6 (six) hours  as needed. 30 tablet 0   nicotine (NICODERM CQ - DOSED IN MG/24 HOURS) 21 mg/24hr patch Place 1 patch (21 mg total) onto the skin daily. 28 patch 0   senna-docusate (SENOKOT-S) 8.6-50 MG tablet Take 2 tablets by mouth daily. 120 tablet 0   No current facility-administered medications for this visit.     PHYSICAL EXAMINATION: ECOG PERFORMANCE STATUS: 1 - Symptomatic but completely ambulatory Vitals:   03/06/21 1343  BP: 124/80  Pulse: (!) 102  Resp: 18  Temp: 99.3 F (37.4 C)  SpO2: 100%   Filed Weights   03/06/21 1343  Weight: 152 lb (68.9 kg)    Physical Exam Constitutional:      Comments: distressed due to rectal discomfort  HENT:     Head: Normocephalic and atraumatic.  Eyes:      General: No scleral icterus. Cardiovascular:     Rate and Rhythm: Normal rate and regular rhythm.     Heart sounds: Normal heart sounds.  Pulmonary:     Effort: Pulmonary effort is normal. No respiratory distress.     Breath sounds: No wheezing.  Abdominal:     General: Bowel sounds are normal. There is no distension.     Palpations: Abdomen is soft.  Musculoskeletal:        General: No deformity. Normal range of motion.     Cervical back: Normal range of motion and neck supple.  Skin:    General: Skin is warm and dry.     Findings: No erythema or rash.  Neurological:     Mental Status: He is alert and oriented to person, place, and time. Mental status is at baseline.     Cranial Nerves: No cranial nerve deficit.     Coordination: Coordination normal.  Psychiatric:        Mood and Affect: Mood normal.    LABORATORY DATA:  I have reviewed the data as listed Lab Results  Component Value Date   WBC 13.2 (H) 03/06/2021   HGB 8.6 (L) 03/06/2021   HCT 27.5 (L) 03/06/2021   MCV 74.1 (L) 03/06/2021   PLT 526 (H) 03/06/2021   Recent Labs    02/20/21 1102 02/21/21 1331 03/06/21 1304  NA 137 134* 135  K 4.4 4.3 4.0  CL 100 98 99  CO2 27 27 28   GLUCOSE 96 198* 90  BUN 12 14 8   CREATININE 0.74 0.93 0.88  CALCIUM 8.6* 8.7* 8.8*  GFRNONAA >60 >60 >60  PROT 7.9 8.2* 8.1  ALBUMIN 2.2* 2.3* 3.0*  AST 13* 14* 14*  ALT 21 19 15   ALKPHOS 88 86 78  BILITOT 0.3 <0.1* 0.3    Iron/TIBC/Ferritin/ %Sat    Component Value Date/Time   IRON 25 (L) 03/06/2021 1304   TIBC 328 03/06/2021 1304   FERRITIN 84 03/06/2021 1304   IRONPCTSAT 8 (L) 03/06/2021 1304      RADIOGRAPHIC STUDIES: I have personally reviewed the radiological images as listed and agreed with the findings in the report. CT ABDOMEN PELVIS WO CONTRAST  Addendum Date: 02/15/2021   ADDENDUM REPORT: 02/15/2021 10:27 ADDENDUM: Signs of potential fishbone in the colon well upstream from the rectal mass. Suggest attention  on follow-up as these can occasionally lead to bowel perforation, this is an unrelated finding and not currently associated with inflammation. (Image 82/6 and image 25/5) These results will be called to the ordering clinician or representative by the Radiologist Assistant, and communication documented in the PACS or [employer]. Electronically Signed  By: Zetta Bills M.D.   On: 02/15/2021 10:27   Result Date: 02/15/2021 CLINICAL DATA:  New rectal cancer with severe pain. EXAM: CT ABDOMEN AND PELVIS WITHOUT CONTRAST TECHNIQUE: Multidetector CT imaging of the abdomen and pelvis was performed following the standard protocol without IV contrast. COMPARISON:  PET CT that was performed on February 13, 2021. FINDINGS: Lower chest: Incidental imaging of the lung bases is unremarkable. Hepatobiliary: Small low-density lesion in the RIGHT hepatic lobe (image 9/2 6 mm, likely a small cyst. Small cyst along the RIGHT hepatic margin. No pericholecystic stranding. No gross biliary duct distension. Pancreas: Pancreas with mild atrophy. No sign of adjacent inflammation. Spleen: Spleen normal size and contour. Adrenals/Urinary Tract: Adrenal glands are normal. Symmetric smooth contour of bilateral kidneys. No hydronephrosis. No nephrolithiasis. Urinary bladder with smooth contours but extending into the lower abdomen with moderate to marked distension. Stomach/Bowel: Stomach without evidence of adjacent stranding. No small bowel dilation. Normal appendix. Colon is stool filled without signs of obstruction. At the descending/sigmoid junction there is a linear area of increased calcific density within the fecal stream potentially a small fishbone. No adjacent stranding or free air. Distal to this is the large irregular colonic tumor with increasing size of a horseshoe shaped area of low attenuation about the rectum. Small fluid collection lateral to the pubic 0 wreck talus muscle on image 82 of series 2 contains gas and has  developed in the short interval since the study of Feb 05, 2021, quite subtle on the PET scan of only 1 day ago now containing gas, not containing gas on the recent PET exam. Small amount a of gas also seen in the fluid density that tracks into the perianal region, potentially within the sphincter complex in the intersphincteric plane though not clearly delineated on the current study bulging normal anatomy along the region of the sphincter complex. Ulcerated and irregular circumferential mass. Fluid begins above the pelvic floor and tracks below the pelvic floor along with LEFT ischial rectal fossa collection described above. Associated nodal enlargement in the RIGHT hemipelvis as as outlined on the previous PET exam along with hypogastric lymph nodes and extensive mesorectal soft tissue. Inguinal lymph nodes with enlargement, potentially reactive given above findings. No free intraperitoneal air. Vascular/Lymphatic: No retroperitoneal adenopathy. Calcified atheromatous plaque of the abdominal aorta. No aneurysmal dilation. Reproductive: Inseparable from the process in the mesorectum. Other: No free air.  No ascites. Musculoskeletal: No acute bone finding or destructive bone process. Spinal degenerative changes. IMPRESSION: Signs of presumed perforated rectal mass with contained perforation with fluid and gas extending above and below the pelvic floor, potentially involving the sphincter complex and extending into LEFT ischial rectal fossa. GI and or surgical consultation may be helpful for further evaluation. Fluid and gas has developed since the Feb 06, 2020 evaluation where there was only minimal low-attenuation outside of the rectum. Signs of nodal disease in the pelvis is outlined on the PET exam. Electronically Signed: By: Zetta Bills M.D. On: 02/14/2021 13:38   CT ABDOMEN PELVIS WO CONTRAST  Result Date: 02/05/2021 CLINICAL DATA:  Elevated white blood count. EXAM: CT ABDOMEN AND PELVIS WITHOUT  CONTRAST TECHNIQUE: Multidetector CT imaging of the abdomen and pelvis was performed following the standard protocol without IV contrast. COMPARISON:  Jan 08, 2020. FINDINGS: Lower chest: No acute abnormality. Hepatobiliary: No gallstones or biliary dilatation is noted. Grossly stable 1 cm low density is noted along inferior portion of right hepatic lobe. Stable probable small cyst is seen  posteriorly in the right hepatic lobe. Enhancing abnormality seen on prior exam in left hepatic lobe is not visualized currently due to the lack of intravenous contrast administration. Pancreas: Unremarkable. No pancreatic ductal dilatation or surrounding inflammatory changes. Spleen: Normal in size without focal abnormality. Adrenals/Urinary Tract: Adrenal glands are unremarkable. Kidneys are normal, without renal calculi, focal lesion, or hydronephrosis. Bladder is unremarkable. Stomach/Bowel: The stomach appears normal. The appendix appears normal. There is no evidence of bowel obstruction. There appears to be significantly increased wall thickening involving the rectum with a significantly increased amount of inflammatory change in the perirectal soft tissues. This is concerning for rectal malignancy or possibly severe proctitis. Stable linear high density is seen adjacent to sigmoid colon concerning for ingested bone fragment. Vascular/Lymphatic: Aortic atherosclerosis. 3.1 x 2.3 cm right internal iliac lymph node is noted which is new since prior exam and highly concerning for metastatic disease. Reproductive: Prostate is unremarkable. Other: No abdominal wall hernia or abnormality. No abdominopelvic ascites. Musculoskeletal: No acute or significant osseous findings. IMPRESSION: Severe rectal wall thickening is noted with surrounding inflammation which is significantly worse compared to prior exam. There also appears to be a 3.1 x 2.3 cm right internal iliac lymph node which is concerning for metastatic disease. These  findings are concerning for rectal malignancy or severe proctitis. Sigmoidoscopy is recommended. Enhancing abnormality seen in left hepatic lobe on prior exam is not visualized currently due to lack of intravenous contrast. Electronically Signed   By: Marijo Conception M.D.   On: 02/05/2021 11:30   CT PELVIS W CONTRAST  Result Date: 02/20/2021 CLINICAL DATA:  55 year old with rectal cancer with a percutaneous drain. Patient complains of fluid leaking around the drain. EXAM: CT PELVIS WITH CONTRAST TECHNIQUE: Multidetector CT imaging of the pelvis was performed using the standard protocol following the bolus administration of intravenous contrast. CONTRAST:  140mL OMNIPAQUE IOHEXOL 300 MG/ML  SOLN COMPARISON:  CT 02/14/2021 FINDINGS: Urinary Tract: Fluid-filled urinary bladder. Right kidney is only partially imaged. Bowel: Known large rectal mass with circumferential thickening in the rectum and anal region. No significant dilatation of the sigmoid colon or other visualized portions of the colon. Normal caliber of the visualized small bowel. Vascular/Lymphatic: Extensive atherosclerotic calcifications. Again noted is low-density nodule or lymph node in the along the right pelvic wall that measures 3.1 x 2.5 cm on sequence 5 image 31. Prominent inguinal lymph nodes bilaterally. Reproductive:  Prostate is poorly visualized. Other: Percutaneous drain was placed in the perirectal/perianal abscess on 02/15/2021. The large perirectal abscess has markedly decreased in size. Small residual pocket of gas and fluid just inferior to the drain on sequence 5, image 48 that measures 3.0 x 2.4 x 2.2 cm. There is new gas in the subcutaneous tissues of the medial left buttock and there is concern for an ulceration or wound in this area. Scattered gas-filled collections along the medial left buttock and perirectal region. Cannot exclude a fistula connection between the rectal mass and the subcutaneous gas-filled collections.  Musculoskeletal: No acute bone abnormality. IMPRESSION: 1. Large perirectal/perianal abscess has markedly decreased in size since placement of the percutaneous drain. There are residual gas-filled collections in the soft tissues and suspect there is a fistula or sinus tract between the rectal mass and the subcutaneous tissues. 2. Soft tissue gas along the medial left buttock and concern for a cutaneous ulceration in this area. 3. Large rectal mass with evidence for a large necrotic right pelvic lymph node. Electronically Signed   By: Quita Skye  Anselm Pancoast M.D.   On: 02/20/2021 15:42   PERIPHERAL VASCULAR CATHETERIZATION  Result Date: 02/27/2021 See surgical note for result.  NM PET Image Initial (PI) Skull Base To Thigh  Addendum Date: 02/14/2021   ADDENDUM REPORT: 02/14/2021 13:43 ADDENDUM: For clarification the comparison from May 2nd is from 2021. Nodal disease that is present on the current study and the most recent comparison is not visible on the previous more remote CT assessment. Nor is the fluid density in the area of the mesorectum that tracks above and below the pelvic floor as described. Additionally, there is a spiculated nodule in the LEFT upper lobe with morphologic features that raise the question of small bronchogenic neoplasm measuring 9 x 8 mm (8 x 8 mm in the initial report). Referral to multi disciplinary thoracic oncologic setting may be helpful for further assessment. In the current context could consider short interval follow-up or biopsy for further assessment as warranted. These results will be called to the ordering clinician or representative by the Radiologist Assistant, and communication documented in the PACS or [employer]. Electronically Signed   By: Zetta Bills M.D.   On: 02/14/2021 13:43   Addendum Date: 02/14/2021   ADDENDUM REPORT: 02/14/2021 13:21 ADDENDUM: These results were called to the ordering clinician or representative by the Radiologist Assistant, and communication  documented in the PACS or [employer]. Electronically Signed   By: Zetta Bills M.D.   On: 02/14/2021 13:21   Result Date: 02/14/2021 CLINICAL DATA:  Initial treatment strategy for rectal cancer in a 55 year old male. Patient reports severe pain in symptoms that made the examination challenging. EXAM: NUCLEAR MEDICINE PET SKULL BASE TO THIGH TECHNIQUE: 7.97 mCi F-18 FDG was injected intravenously. Full-ring PET imaging was performed from the skull base to thigh after the radiotracer. CT data was obtained and used for attenuation correction and anatomic localization. Fasting blood glucose: 107 mg/dl COMPARISON:  CT of the abdomen and pelvis Feb 05, 2021 and CT of the abdomen and pelvis Jan 07, 2021. FINDINGS: Mediastinal blood pool activity: SUV max 2.05 Liver activity: SUV max NA NECK: Asymmetric muscular activity in the neck in prevertebral musculature. LEFT over RIGHT without visible lesion. Incidental CT findings: none CHEST: Spiculated nodule in the LEFT upper lobe measures approximately 8 x 8 mm with some surrounding spiculation (image 77/8) metabolic activity not elevated above mediastinal blood pool. Signs of pulmonary emphysema. Airways are patent. No adenopathy in the chest or signs of suspicious nodule elsewhere. Incidental CT findings: Calcified atherosclerotic plaque of the thoracic aorta. 4 cm caliber of the ascending thoracic aorta. Minimal scattered atherosclerotic plaque with calcification. Heart size normal without pericardial effusion. Normal caliber central pulmonary vessels. Normal appearance of the esophagus. ABDOMEN/PELVIS: No signs of solid organ metastasis. Cyst in the RIGHT hepatic lobe. Marked hypermetabolic activity about the patient's known rectal mass. This mass appears less well-defined. The mesorectum is filled with low attenuation to the LEFT posterolateral aspect of the mesorectum with crescentic appearance tracking into the upper sphincter complex, horseshoe shaped low  attenuation surrounds the rectum and anus as it extends towards the pelvic floor. Mass measuring approximately 6.5 x 4.9 cm (image 248/3) maximum SUV 17.6. Lenticular area of low attenuation with some peripheral increased metabolic activity, more nodular along the LEFT posterolateral margin on image 257 of series 3. This area measures approximately 2.7 cm in greatest thickness and surrounds the rectum and anus approximately 270 degrees of its circumference above the pelvic floor and at the pelvic floor slightly  less. Bulky RIGHT pelvic sidewall/hypogastric lymph node outside of the mesorectum with stippled calcification measuring 19 mm and without signs of increased metabolic activity. Small LEFT hypogastric lymph node just peripheral to the internal external bifurcation on image 228 of series 3 displays low level FDG uptake with a maximum SUV of 3.2. High RIGHT internal just below or at the internal/common iliac transition, lymph node (image 214/3) 12 mm maximum SUV of 4.8. LEFT high hypogastric lymph node (image 217/3) 8 mm with low level FDG uptake, maximum SUV of 3.0. Scattered lymph nodes throughout the retroperitoneum with FDG uptake only slightly above mediastinal blood pool. For instance on image 181 of series 3 is an 8 mm lymph node with a maximum SUV of 2.2 along the LEFT periaortic chain. Bilateral inguinal lymph nodes largest on the RIGHT (image 248/3) 11 mm with a maximum SUV of 2.8 Primary tumor difficult to assess in terms of margins due to surrounding stranding which has worsened since previous imaging. Incidental CT findings: Tiny hypodensity in the RIGHT hemi liver (image 138/3) no focal increased FDG uptake though this 4 mm area may be below PET resolution in this context. SKELETON: Diffuse increased metabolic activity throughout the visualized skeleton without focal area of activity to suggest bony metastasis. Incidental CT findings: none IMPRESSION: Signs of locally advanced rectal cancer with  ballooning of the mesorectum now with low attenuation material that was not present on previous examinations (not present on 19-Jan-2023 and much less pronounced on 2023-02-17) with extensive stranding and inflammation. Findings suspicious for rectal compromise/focal defect in the rectum at the site of tumor or along the LEFT posterolateral aspect and contained inflammation in the mesorectum. Rectal tumor also appears bulkier in general and part of this could be due to marked worsening in short interval of rectal tumor. Large lymph node along the RIGHT pelvic sidewall and smaller lymph nodes as outlined above in the pelvis outside of the mesorectum including inguinal lymph nodes which are suspicious for disease. Note that the dominant lymph node displays little FDG uptake, potentially due to necrosis. Lymph nodes in the groin are equivocal in the setting of extensive inflammation and potentially reactive showing enlargement since previous imaging from 2023/01/19. No signs of solid organ metastasis. Diffuse marrow uptake likely related to symptomatic anemia. Electronically Signed: By: Zetta Bills M.D. On: 02/14/2021 08:58   CT IMAGE GUIDED DRAINAGE BY PERCUTANEOUS CATHETER  Result Date: 02/15/2021 INDICATION: 55 year old gentleman with perirectal abscess presents to IR for CT-guided drain placement EXAM: CT GUIDED DRAINAGE OF PERIRECTAL ABSCESS MEDICATIONS: The patient is currently admitted to the hospital and receiving intravenous antibiotics. The antibiotics were administered within an appropriate time frame prior to the initiation of the procedure. ANESTHESIA/SEDATION: Two mg IV Versed 100 mcg IV Fentanyl Moderate Sedation Time:  10 minutes The patient was continuously monitored during the procedure by the interventional radiology nurse under my direct supervision. COMPLICATIONS: None immediate. TECHNIQUE: Informed written consent was obtained from the patient after a thorough discussion of the procedural risks,  benefits and alternatives. All questions were addressed. Maximal Sterile Barrier Technique was utilized including caps, mask, sterile gowns, sterile gloves, sterile drape, hand hygiene and skin antiseptic. A timeout was performed prior to the initiation of the procedure. PROCEDURE: Patient position prone on the procedure table. The posterior pelvic wall skin prepped and draped in usual fashion. Following local lidocaine administration, 18 gauge needle advanced into the perirectal fluid collection utilizing CT guidance. 18 gauge needle exchanged for 10 [place]  multipurpose pigtail drain over 0.035 inch guidewire. 50 mL purulent sample aspirated and sent for Gram stain and culture. Drain secured to skin with suture and connected to bulb suction. Post placement CT confirmed appropriate placement of the drain. IMPRESSION: 10 [place] multipurpose pigtail drain placed in perirectal abscess. Electronically Signed   By: Miachel Roux M.D.   On: 02/15/2021 15:24       ASSESSMENT & PLAN:  1. Rectal cancer ([place])   2. Goals of care, counseling/discussion   3. Iron deficiency anemia due to chronic blood loss   4. Perforated rectum (HCC)   5. Leukocytosis, unspecified type   6. Thrombocytosis   7. Lung nodule   Cancer Staging Rectal cancer [facility]) Staging form: Colon and Rectum, AJCC 8th Edition - Clinical stage from 02/08/2021: Stage IIIB (cT4a, cN1, cM0) - Signed by Earlie Server, MD on 03/06/2021   #locally advanced rectal cancer. Images were independently reviewed by me and discussed with patient. His case is complicated with a perirectal abscess. Prior to onset of perirectal abscess, on his 02/05/2021 scan, he was noted to have right internal iliac lymph node 3 x 1 x 2.3 cm which is concerning for nodal disease. On Subsequent images it is difficult to distinguish whether lymphadenopathy was due to nodal disease versus acute inflammation. Pending MRI pelvis evaluation. Recommend neoadjuvant chemotherapy with FOLFOX  x8 cycles, followed by concurrent chemoradiation followed by surgery.  Given that his case has been complicated with abscess.  Recommend patient to establish care with colorectal surgeon prior to starting the treatments Patient was prescribed to finish another 10 days of Augmentin.  #Spiculated lung nodule, likely primary lung cancer. Recommend CT-guided biopsy.  #Iron deficiency anemia Status post IV Venofer treatments Ferritin 84, iron saturation 8.  Hemoglobin improved to 8.6. We will schedule patient for additional IV Venofer treatment weekly x2.  #Leukocytosis due to recent.  Rectal abscess White count has trended down.  #Thrombocytosis, likely due to iron deficiency.  Anticipate to improve with improvement of iron stores.  #Rectal pain, recommend to start Norco 5 mg every 6 hours as needed.    Orders Placed This Encounter  Procedures   CT Biopsy    Standing Status:   Future    Standing Expiration Date:   03/06/2022    Order Specific Question:   Lab orders requested (DO NOT place separate lab orders, these will be automatically ordered during procedure specimen collection):    Answer:   Surgical Pathology    Order Specific Question:   Reason for Exam (SYMPTOM  OR DIAGNOSIS REQUIRED)    Answer:   biopsy of left upper lobe lung nodule    Order Specific Question:   Preferred location?    Answer:   [facility]    All questions were answered. The patient knows to call the clinic with any problems questions or concerns.  Return of visit: TBD Thank you for this kind referral and the opportunity to participate in the care of this patient. A copy of today's note is routed to referring provider    Earlie Server, MD, PhD Hematology Oncology [facility] at [facility] Pager- [phone] 03/06/2021

## 2021-03-07 ENCOUNTER — Telehealth: Payer: Self-pay | Admitting: Oncology

## 2021-03-07 ENCOUNTER — Telehealth: Payer: Self-pay

## 2021-03-07 ENCOUNTER — Other Ambulatory Visit: Payer: Self-pay

## 2021-03-07 ENCOUNTER — Encounter: Payer: Self-pay | Admitting: Oncology

## 2021-03-07 ENCOUNTER — Ambulatory Visit
Admission: RE | Admit: 2021-03-07 | Discharge: 2021-03-07 | Disposition: A | Discharge status: Home / Self Care | Payer: Self-pay | Source: Ambulatory Visit | Attending: Oncology | Admitting: Oncology

## 2021-03-07 DIAGNOSIS — C20 Malignant neoplasm of rectum: Secondary | ICD-10-CM | POA: Insufficient documentation

## 2021-03-07 LAB — CEA: CEA: 1.7 ng/mL (ref 0.0–4.7)

## 2021-03-07 NOTE — Telephone Encounter (Signed)
-----   Message from Earlie Server, MD sent at 03/06/2021 10:23 PM EDT ----- Steffanie Dunn He is going to have MRI tomorrow. He will also do CT guided biopsy of lung nodule- I do not need to wait on that before starting chemo When is he going to see colorectal surgeon?   Brendalee Matthies Please schedule him for additional Venofer weekly x 2. Avoid date of colorectal surgeon appt.

## 2021-03-07 NOTE — Telephone Encounter (Signed)
Left VM with patient to notify him of 2 iron infusions that have been scheduled.

## 2021-03-07 NOTE — Telephone Encounter (Signed)
Patient has not been seen by colorecta surgeon yet. Appt scheduled for 7/19. Hayley called [facility] to move up appt but that is the earliest date they had.   Surgery and Biospy date pending.

## 2021-03-08 ENCOUNTER — Telehealth: Payer: Self-pay

## 2021-03-08 ENCOUNTER — Encounter: Payer: Self-pay | Admitting: Nurse Practitioner

## 2021-03-08 NOTE — Telephone Encounter (Signed)
Disability forms have been sent to [facility]. Fax: [phone]. Fax confimation recevied.

## 2021-03-12 ENCOUNTER — Encounter: Payer: Self-pay | Admitting: Oncology

## 2021-03-13 ENCOUNTER — Encounter: Payer: Self-pay | Admitting: Surgery

## 2021-03-13 ENCOUNTER — Other Ambulatory Visit: Payer: Self-pay | Admitting: Oncology

## 2021-03-13 ENCOUNTER — Encounter: Payer: Self-pay | Admitting: Oncology

## 2021-03-13 ENCOUNTER — Other Ambulatory Visit: Payer: Self-pay

## 2021-03-13 ENCOUNTER — Ambulatory Visit (INDEPENDENT_AMBULATORY_CARE_PROVIDER_SITE_OTHER): Payer: BC Managed Care – PPO | Admitting: Surgery

## 2021-03-13 VITALS — BP 113/74 | HR 98 | Temp 98.7°F | Ht 68.0 in | Wt 152.6 lb

## 2021-03-13 DIAGNOSIS — L0231 Cutaneous abscess of buttock: Secondary | ICD-10-CM

## 2021-03-13 DIAGNOSIS — C20 Malignant neoplasm of rectum: Secondary | ICD-10-CM

## 2021-03-13 DIAGNOSIS — K611 Rectal abscess: Secondary | ICD-10-CM

## 2021-03-13 MED ORDER — ONDANSETRON HCL 8 MG PO TABS: 8.0000 mg | ORAL_TABLET | Freq: Two times a day (BID) | ORAL | 1 refills | Status: DC | PRN

## 2021-03-13 MED ORDER — LIDOCAINE-PRILOCAINE 2.5-2.5 % EX CREA: TOPICAL_CREAM | CUTANEOUS | 3 refills | Status: DC

## 2021-03-13 MED ORDER — PROCHLORPERAZINE MALEATE 10 MG PO TABS: 10.0000 mg | ORAL_TABLET | Freq: Four times a day (QID) | ORAL | 1 refills | Status: DC | PRN

## 2021-03-13 MED ORDER — AMOXICILLIN-POT CLAVULANATE 875-125 MG PO TABS: 1.0000 | ORAL_TABLET | Freq: Two times a day (BID) | ORAL | 0 refills | Status: DC

## 2021-03-13 NOTE — Consult Note (Signed)
Patient ID: Barry Numbers., male   DOB: 07-Aug-1966, 55 y.o.   MRN: [id number]  HPI Barry Hyslop. is a 55 y.o. male seen in consultation at the request of Dr. Tasia Catchings.  He does have a biopsy showing moderately differentiated adenocarcinoma of the rectum.  He has undergone a CT scan and MRI that have personally reviewed the images.  Recently he was admitted to the hospital for a perirectal abscess that was drained by interventional radiology with a drain.  He has done better from that aspect.  Please note that the drain was on the left side.  He now comes in with increased pain what he calls a boil on the right side of his buttocks.  He also reports some fullness on the left perianal area.  Pain is sharp it is moderate in intensity.  He denies any fevers any chills.  He is able to eat.  He has normal bowel movements.  No nausea no vomiting. He had an MRI last week that have personally reviewed. There is evidence of low rectal cancer invading the sphincteric complex.  There is no evidence of a recurrent abscess. CBC shows a hemoglobin of 8.6 and mildly elevated white count of 13.  Thrombocytosis has improved down to 526,000.  CMP was normal except an albumin of 3.0. He is taking Augmentin that was prescribed by Dr. Tasia Catchings  HPI  Past Medical History:  Diagnosis Date   Headache    Left shoulder pain    Rectal cancer [facility])     Past Surgical History:  Procedure Laterality Date   COLONOSCOPY WITH PROPOFOL N/A 02/06/2021   Procedure: COLONOSCOPY WITH PROPOFOL;  Surgeon: Lin Landsman, MD;  Location: [facility] ENDOSCOPY;  Service: Gastroenterology;  Laterality: N/A;   PORTA CATH INSERTION N/A 02/27/2021   Procedure: PORTA CATH INSERTION;  Surgeon: Algernon Huxley, MD;  Location: [place] CV LAB;  Service: Cardiovascular;  Laterality: N/A;   ROTATOR CUFF REPAIR Left     Family History  Problem Relation Age of Onset   Cancer Sister    Diabetes Mother    Cancer Maternal Grandmother    Cancer  Paternal Grandmother     Social History Social History   Tobacco Use   Smoking status: Former    Packs/day: 1.00    Years: 10.00    Pack years: 10.00    Types: Cigars, Cigarettes    Quit date: 02/04/2021    Years since quitting: 0.1   Smokeless tobacco: Never  Substance Use Topics   Alcohol use: Not Currently   Drug use: No    Allergies  Allergen Reactions   Shellfish Allergy Swelling    Contrast Dye is OK     Current Outpatient Medications  Medication Sig Dispense Refill   acetaminophen (TYLENOL) 325 MG tablet Take 650 mg by mouth every 6 (six) hours as needed.     feeding supplement (ENSURE ENLIVE / ENSURE PLUS) LIQD Take 237 mLs by mouth daily. 237 mL 12   ferrous sulfate 325 (65 FE) MG tablet Take 1 tablet (325 mg total) by mouth 2 (two) times daily with a meal. 60 tablet 3   HYDROcodone-acetaminophen (NORCO) 5-325 MG tablet Take 1 tablet by mouth every 6 (six) hours as needed for moderate pain. 60 tablet 0   ibuprofen (ADVIL) 400 MG tablet Take 1 tablet (400 mg total) by mouth every 6 (six) hours as needed. 30 tablet 0   nicotine (NICODERM CQ - DOSED IN MG/24 HOURS)  21 mg/24hr patch Place 1 patch (21 mg total) onto the skin daily. 28 patch 0   senna-docusate (SENOKOT-S) 8.6-50 MG tablet Take 2 tablets by mouth daily. 120 tablet 0   amoxicillin-clavulanate (AUGMENTIN) 875-125 MG tablet Take 1 tablet by mouth 2 (two) times daily. 28 tablet 0   lidocaine-prilocaine (EMLA) cream Apply to affected area once 30 g 3   ondansetron (ZOFRAN) 8 MG tablet Take 1 tablet (8 mg total) by mouth 2 (two) times daily as needed for refractory nausea / vomiting. Start on day 3 after chemotherapy. 30 tablet 1   prochlorperazine (COMPAZINE) 10 MG tablet Take 1 tablet (10 mg total) by mouth every 6 (six) hours as needed (Nausea or vomiting). 30 tablet 1   No current facility-administered medications for this visit.     Review of Systems Full ROS  was asked and was negative except for the  information on the HPI  Physical Exam Blood pressure 113/74, pulse 98, temperature 98.7 F (37.1 C), height 5\' 8"  (1.727 m), weight 152 lb 9.6 oz (69.2 kg), SpO2 97 %. CONSTITUTIONAL:NAD EYES: Pupils are equal, round,  Sclera are non-icteric. EARS, NOSE, MOUTH AND THROAT: He is wearing a mask. Hearing is intact to voice. LYMPH NODES:  Lymph nodes in the neck are normal. RESPIRATORY:  Lungs are clear. There is normal respiratory effort, with equal breath sounds bilaterally, and without pathologic use of accessory muscles. CARDIOVASCULAR: Heart is regular without murmurs, gallops, or rubs. GI: The abdomen is soft, nontender, and nondistended. There are no palpable masses. There is no hepatosplenomegaly. There are normal bowel sounds  Rectal: There is an abscess on right buttocks, tender to palpation, there is also evidence of fullness on left lateral perianal area, exam is limited due to pain. Evidence of prior wound site on left lateral perianal area. No evidence of necrotizing infection. MUSCULOSKELETAL: Normal muscle strength and tone. No cyanosis or edema.   SKIN: Turgor is good and there are no pathologic skin lesions or ulcers. NEUROLOGIC: Motor and sensation is grossly normal. Cranial nerves are grossly intact. PSYCH:  Oriented to person, place and time. Affect is normal.  Data Reviewed  I have personally reviewed the patient's imaging, laboratory findings and medical records.    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  In addition he has a fullness on the left perianal area that I will like to examine further and at least aspirate this.  I do think that it is reasonable to attempt drainage here in the office under local.  He understands that if he is unable to tolerate it we will have to do the exam under anesthesia in the OR. Case discussed in  detail with Dr. Tasia Catchings  Procedure Note: Incision and drainage of Right perirectal abscess 2.   Debridement of skin , sub q and fascia measuring 1.5x1.5 cms = 2.25cm2   Anesthesia: Lidocaine 1% with epinephrine  EBL: Minimal  Findings: Right gluteal abscess.  Evidence of fullness in the left perianal area consistent with direct invasion.  Needle aspiration did not reveal pus  Complications: none  Informed consent patient was prepped and draped in the usual sterile fashion and was placed in the left lateral decubitus position.  We used local anesthetic to numb the 2 areas 1 on the right buttocks and the other 1 at the left perianal area.  We will start by incision and drainage  of the right buttocks/perirectal abscess.  Attic incision was created and about 5 cc of frank pus was drained.  Irrigation with normal saline was obtained.  We also sent appropriate cultures.  We debrided the subcutaneous tissue all the way to the fascia using a knife.  Hemostasis was obtained with pressure.  Plain packing was placed.  Attention then was turned to the left perineal area where we also infiltrated lidocaine aspirated the area multiple times failed to reveal any infection.  Upon rectal examination there is felt to be direct invasion of the tumor.  Again exam was very limited due to significant tenderness.  Caroleen Hamman, MD FACS General Surgeon 03/13/2021, 1:48 PM

## 2021-03-13 NOTE — Patient Instructions (Signed)
You will need to change the packing every day.  Remove the packing and dressing, shower, rinse area well and pat dry, then replace the packing and dressing.  Finish all of your antibiotics.  Follow up here in 2 weeks.  Call with any signs of infection. Fever, spreading swelling and redness.

## 2021-03-13 NOTE — Progress Notes (Signed)
START ON PATHWAY REGIMEN - Colorectal     A cycle is every 14 days:     Oxaliplatin      Leucovorin      Fluorouracil      Fluorouracil   **Always confirm dose/schedule in your pharmacy ordering system**  Patient Characteristics: Preoperative or Nonsurgical Candidate (Clinical Staging), Rectal, cT3 - cT4, cN0 or Any cT, cN+ Tumor Location: Rectal Therapeutic Status: Preoperative or Nonsurgical Candidate (Clinical Staging) AJCC T Category: cT4a AJCC N Category: cN2 AJCC M Category: cM0 AJCC 8 Stage Grouping: IIIC Intent of Therapy: Curative Intent, Discussed with Patient

## 2021-03-14 ENCOUNTER — Encounter: Payer: Self-pay | Admitting: Oncology

## 2021-03-14 ENCOUNTER — Inpatient Hospital Stay: Payer: BC Managed Care – PPO | Attending: Oncology

## 2021-03-14 VITALS — BP 140/87 | HR 103 | Temp 97.7°F | Resp 20

## 2021-03-14 DIAGNOSIS — G893 Neoplasm related pain (acute) (chronic): Secondary | ICD-10-CM | POA: Diagnosis not present

## 2021-03-14 DIAGNOSIS — D500 Iron deficiency anemia secondary to blood loss (chronic): Secondary | ICD-10-CM | POA: Insufficient documentation

## 2021-03-14 DIAGNOSIS — D75839 Thrombocytosis, unspecified: Secondary | ICD-10-CM | POA: Diagnosis not present

## 2021-03-14 DIAGNOSIS — Z79891 Long term (current) use of opiate analgesic: Secondary | ICD-10-CM | POA: Diagnosis not present

## 2021-03-14 DIAGNOSIS — Z5111 Encounter for antineoplastic chemotherapy: Secondary | ICD-10-CM | POA: Insufficient documentation

## 2021-03-14 DIAGNOSIS — Z87891 Personal history of nicotine dependence: Secondary | ICD-10-CM | POA: Diagnosis not present

## 2021-03-14 DIAGNOSIS — R911 Solitary pulmonary nodule: Secondary | ICD-10-CM | POA: Diagnosis not present

## 2021-03-14 DIAGNOSIS — Z79899 Other long term (current) drug therapy: Secondary | ICD-10-CM | POA: Insufficient documentation

## 2021-03-14 DIAGNOSIS — L0231 Cutaneous abscess of buttock: Secondary | ICD-10-CM | POA: Insufficient documentation

## 2021-03-14 DIAGNOSIS — C20 Malignant neoplasm of rectum: Secondary | ICD-10-CM | POA: Diagnosis present

## 2021-03-14 MED ORDER — SODIUM CHLORIDE 0.9 % IV SOLN
Freq: Once | INTRAVENOUS | Status: AC
  Filled 2021-03-14: qty 250

## 2021-03-14 MED ORDER — IRON SUCROSE 20 MG/ML IV SOLN
200.0000 mg | Freq: Once | INTRAVENOUS | Status: AC
  Administered 2021-03-14: 200 mg via INTRAVENOUS
  Filled 2021-03-14: qty 10

## 2021-03-14 MED ORDER — SODIUM CHLORIDE 0.9 % IV SOLN: 200.0000 mg | Freq: Once | INTRAVENOUS | Status: DC

## 2021-03-14 NOTE — Patient Instructions (Signed)
[facility] [place] [facility]  Discharge Instructions: Thank you for choosing [facility] to provide your oncology and hematology care.  If you have a lab appointment with the Cancer Center, please go directly to the Cancer Center and check in at the registration area.  Wear comfortable clothing and clothing appropriate for easy access to any Portacath or PICC line.   We strive to give you quality time with your provider. You may need to reschedule your appointment if you arrive late (15 or more minutes).  Arriving late affects you and other patients whose appointments are after yours.  Also, if you miss three or more appointments without notifying the office, you may be dismissed from the clinic at the provider's discretion.      For prescription refill requests, have your pharmacy contact our office and allow 72 hours for refills to be completed.    Today you received the following : Venofer   To help prevent nausea and vomiting after your treatment, we encourage you to take your nausea medication as directed.  BELOW ARE SYMPTOMS THAT SHOULD BE REPORTED IMMEDIATELY: . *FEVER GREATER THAN 100.4 F (38 C) OR HIGHER . *CHILLS OR SWEATING . *NAUSEA AND VOMITING THAT IS NOT CONTROLLED WITH YOUR NAUSEA MEDICATION . *UNUSUAL SHORTNESS OF BREATH . *UNUSUAL BRUISING OR BLEEDING . *URINARY PROBLEMS (pain or burning when urinating, or frequent urination) . *BOWEL PROBLEMS (unusual diarrhea, constipation, pain near the anus) . TENDERNESS IN MOUTH AND THROAT WITH OR WITHOUT PRESENCE OF ULCERS (sore throat, sores in mouth, or a toothache) . UNUSUAL RASH, SWELLING OR PAIN  . UNUSUAL VAGINAL DISCHARGE OR ITCHING   Items with * indicate a potential emergency and should be followed up as soon as possible or go to the Emergency Department if any problems should occur.  Please show the CHEMOTHERAPY ALERT CARD or IMMUNOTHERAPY ALERT CARD at check-in to the Emergency  Department and triage nurse.  Should you have questions after your visit or need to cancel or reschedule your appointment, please contact [facility] [place] [facility]  [phone] and follow the prompts.  Office hours are 8:00 a.m. to 4:30 p.m. Monday - Friday. Please note that voicemails left after 4:00 p.m. may not be returned until the following business day.  We are closed weekends and major holidays. You have access to a nurse at all times for urgent questions. Please call the main number to the clinic [phone] and follow the prompts.  For any non-urgent questions, you may also contact your provider using MyChart. We now offer e-Visits for anyone 18 and older to request care online for non-urgent symptoms. For details visit [email or web address].   Also download the MyChart app! Go to the app store, search "MyChart", open the app, select [facility], and log in with your MyChart username and password.  Due to Covid, a mask is required upon entering the hospital/clinic. If you do not have a mask, one will be given to you upon arrival. For doctor visits, patients may have 1 support person aged 18 or older with them. For treatment visits, patients cannot have anyone with them due to current Covid guidelines and our immunocompromised population.  

## 2021-03-18 ENCOUNTER — Inpatient Hospital Stay: Payer: BC Managed Care – PPO

## 2021-03-18 ENCOUNTER — Other Ambulatory Visit: Payer: Self-pay

## 2021-03-18 ENCOUNTER — Inpatient Hospital Stay (HOSPITAL_BASED_OUTPATIENT_CLINIC_OR_DEPARTMENT_OTHER): Payer: BC Managed Care – PPO | Admitting: Oncology

## 2021-03-18 ENCOUNTER — Encounter: Payer: Self-pay | Admitting: Oncology

## 2021-03-18 VITALS — BP 128/80 | HR 94 | Temp 97.5°F | Resp 16 | Wt 156.0 lb

## 2021-03-18 DIAGNOSIS — Z7189 Other specified counseling: Secondary | ICD-10-CM

## 2021-03-18 DIAGNOSIS — D75839 Thrombocytosis, unspecified: Secondary | ICD-10-CM

## 2021-03-18 DIAGNOSIS — K611 Rectal abscess: Secondary | ICD-10-CM

## 2021-03-18 DIAGNOSIS — D500 Iron deficiency anemia secondary to blood loss (chronic): Secondary | ICD-10-CM | POA: Diagnosis not present

## 2021-03-18 DIAGNOSIS — Z5111 Encounter for antineoplastic chemotherapy: Secondary | ICD-10-CM | POA: Insufficient documentation

## 2021-03-18 DIAGNOSIS — C20 Malignant neoplasm of rectum: Secondary | ICD-10-CM

## 2021-03-18 DIAGNOSIS — R911 Solitary pulmonary nodule: Secondary | ICD-10-CM

## 2021-03-18 DIAGNOSIS — Z95828 Presence of other vascular implants and grafts: Secondary | ICD-10-CM

## 2021-03-18 DIAGNOSIS — G893 Neoplasm related pain (acute) (chronic): Secondary | ICD-10-CM

## 2021-03-18 LAB — COMPREHENSIVE METABOLIC PANEL
ALT: 16 U/L (ref 0–44)
AST: 17 U/L (ref 15–41)
Albumin: 3.2 g/dL — ABNORMAL LOW (ref 3.5–5.0)
Alkaline Phosphatase: 72 U/L (ref 38–126)
Anion gap: 5 (ref 5–15)
BUN: 14 mg/dL (ref 6–20)
CO2: 26 mmol/L (ref 22–32)
Calcium: 8.7 mg/dL — ABNORMAL LOW (ref 8.9–10.3)
Chloride: 102 mmol/L (ref 98–111)
Creatinine, Ser: 0.84 mg/dL (ref 0.61–1.24)
GFR, Estimated: >60 mL/min (ref >60)
Glucose, Bld: 125 mg/dL — ABNORMAL HIGH (ref 70–99)
Potassium: 4 mmol/L (ref 3.5–5.1)
Sodium: 133 mmol/L — ABNORMAL LOW (ref 135–145)
Total Bilirubin: 0.2 mg/dL — ABNORMAL LOW (ref 0.3–1.2)
Total Protein: 8 g/dL (ref 6.5–8.1)

## 2021-03-18 LAB — CBC WITH DIFFERENTIAL/PLATELET
Abs Immature Granulocytes: 0.04 10*3/uL (ref 0.00–0.07)
Basophils Absolute: 0.1 10*3/uL (ref 0.0–0.1)
Basophils Relative: 1 %
Eosinophils Absolute: 0.7 10*3/uL — ABNORMAL HIGH (ref 0.0–0.5)
Eosinophils Relative: 6 %
HCT: 30.7 % — ABNORMAL LOW (ref 39.0–52.0)
Hemoglobin: 9.8 g/dL — ABNORMAL LOW (ref 13.0–17.0)
Immature Granulocytes: 0 %
Lymphocytes Relative: 17 %
Lymphs Abs: 2 10*3/uL (ref 0.7–4.0)
MCH: 24.3 pg — ABNORMAL LOW (ref 26.0–34.0)
MCHC: 31.9 g/dL (ref 30.0–36.0)
MCV: 76.2 fL — ABNORMAL LOW (ref 80.0–100.0)
Monocytes Absolute: 1.2 10*3/uL — ABNORMAL HIGH (ref 0.1–1.0)
Monocytes Relative: 10 %
Neutro Abs: 7.3 10*3/uL (ref 1.7–7.7)
Neutrophils Relative %: 66 %
Platelets: 548 10*3/uL — ABNORMAL HIGH (ref 150–400)
RBC: 4.03 MIL/uL — ABNORMAL LOW (ref 4.22–5.81)
WBC: 11.2 10*3/uL — ABNORMAL HIGH (ref 4.0–10.5)
nRBC: 0 % (ref 0.0–0.2)

## 2021-03-18 MED ORDER — SODIUM CHLORIDE 0.9 % IV SOLN
10.0000 mg | Freq: Once | INTRAVENOUS | Status: AC
  Administered 2021-03-18: 10 mg via INTRAVENOUS
  Filled 2021-03-18: qty 10

## 2021-03-18 MED ORDER — SODIUM CHLORIDE 0.9% FLUSH
10.0000 mL | Freq: Once | INTRAVENOUS | Status: AC
  Administered 2021-03-18: 10 mL via INTRAVENOUS
  Filled 2021-03-18: qty 10

## 2021-03-18 MED ORDER — LEUCOVORIN CALCIUM INJECTION 350 MG
750.0000 mg | Freq: Once | INTRAVENOUS | Status: AC
  Administered 2021-03-18: 750 mg via INTRAVENOUS
  Filled 2021-03-18: qty 35

## 2021-03-18 MED ORDER — DEXTROSE 5 % IV SOLN
Freq: Once | INTRAVENOUS | Status: AC
  Filled 2021-03-18: qty 250

## 2021-03-18 MED ORDER — FLUOROURACIL CHEMO INJECTION 2.5 GM/50ML
400.0000 mg/m2 | Freq: Once | INTRAVENOUS | Status: AC
  Administered 2021-03-18: 750 mg via INTRAVENOUS
  Filled 2021-03-18: qty 15

## 2021-03-18 MED ORDER — OXALIPLATIN CHEMO INJECTION 100 MG/20ML
150.0000 mg | Freq: Once | INTRAVENOUS | Status: AC
  Administered 2021-03-18: 150 mg via INTRAVENOUS
  Filled 2021-03-18: qty 20

## 2021-03-18 MED ORDER — PALONOSETRON HCL INJECTION 0.25 MG/5ML
0.2500 mg | Freq: Once | INTRAVENOUS | Status: AC
  Administered 2021-03-18: 0.25 mg via INTRAVENOUS
  Filled 2021-03-18: qty 5

## 2021-03-18 MED ORDER — SODIUM CHLORIDE 0.9 % IV SOLN
2400.0000 mg/m2 | INTRAVENOUS | Status: DC
  Administered 2021-03-18: 4350 mg via INTRAVENOUS
  Filled 2021-03-18: qty 87

## 2021-03-18 MED ORDER — HEPARIN SOD (PORK) LOCK FLUSH 100 UNIT/ML IV SOLN
500.0000 [IU] | Freq: Once | INTRAVENOUS | Status: DC
  Filled 2021-03-18: qty 5

## 2021-03-18 NOTE — Patient Instructions (Signed)
[facility]  Discharge Instructions: Thank you for choosing [facility] to provide your oncology and hematology care.  If you have a lab appointment with the [facility], please go directly to the [facility] and check in at the registration area.  Wear comfortable clothing and clothing appropriate for easy access to any Portacath or PICC line.   We strive to give you quality time with your provider. You may need to reschedule your appointment if you arrive late (15 or more minutes).  Arriving late affects you and other patients whose appointments are after yours.  Also, if you miss three or more appointments without notifying the office, you may be dismissed from the clinic at the provider's discretion.      For prescription refill requests, have your pharmacy contact our office and allow 72 hours for refills to be completed.    Today you received the following chemotherapy and/or immunotherapy agents oxaliplatin, leucovorin, 5FU      To help prevent nausea and vomiting after your treatment, we encourage you to take your nausea medication as directed.  BELOW ARE SYMPTOMS THAT SHOULD BE REPORTED IMMEDIATELY: *FEVER GREATER THAN 100.4 F (38 C) OR HIGHER *CHILLS OR SWEATING *NAUSEA AND VOMITING THAT IS NOT CONTROLLED WITH YOUR NAUSEA MEDICATION *UNUSUAL SHORTNESS OF BREATH *UNUSUAL BRUISING OR BLEEDING *URINARY PROBLEMS (pain or burning when urinating, or frequent urination) *BOWEL PROBLEMS (unusual diarrhea, constipation, pain near the anus) TENDERNESS IN MOUTH AND THROAT WITH OR WITHOUT PRESENCE OF ULCERS (sore throat, sores in mouth, or a toothache) UNUSUAL RASH, SWELLING OR PAIN  UNUSUAL VAGINAL DISCHARGE OR ITCHING   Items with * indicate a potential emergency and should be followed up as soon as possible or go to the Emergency Department if any problems should occur.  Please show the CHEMOTHERAPY ALERT CARD or IMMUNOTHERAPY ALERT  CARD at check-in to the Emergency Department and triage nurse.  Should you have questions after your visit or need to cancel or reschedule your appointment, please contact [facility]  [phone] and follow the prompts.  Office hours are 8:00 a.m. to 4:30 p.m. Monday - Friday. Please note that voicemails left after 4:00 p.m. may not be returned until the following business day.  We are closed weekends and major holidays. You have access to a nurse at all times for urgent questions. Please call the main number to the clinic [phone] and follow the prompts.  For any non-urgent questions, you may also contact your provider using MyChart. We now offer e-Visits for anyone 70 and older to request care online for non-urgent symptoms. For details visit [email or web address].   Also download the MyChart app! Go to the app store, search "MyChart", open the app, select [facility], and log in with your MyChart username and password.  Due to Covid, a mask is required upon entering the hospital/clinic. If you do not have a mask, one will be given to you upon arrival. For doctor visits, patients may have 1 support person aged 31 or older with them. For treatment visits, patients cannot have anyone with them due to current Covid guidelines and our immunocompromised population.   Oxaliplatin Injection What is this medication? OXALIPLATIN (ox AL i PLA tin) is a chemotherapy drug. It targets fast dividing cells, like cancer cells, and causes these cells to die. This medicine is usedto treat cancers of the colon and rectum, and many other cancers. This medicine may be used for other purposes;  ask your health care provider orpharmacist if you have questions. COMMON BRAND NAME(S): Eloxatin What should I tell my care team before I take this medication? They need to know if you have any of these conditions: heart disease history of irregular heartbeat liver disease low blood  counts, like white cells, platelets, or red blood cells lung or breathing disease, like asthma take medicines that treat or prevent blood clots tingling of the fingers or toes, or other nerve disorder an unusual or allergic reaction to oxaliplatin, other chemotherapy, other medicines, foods, dyes, or preservatives pregnant or trying to get pregnant breast-feeding How should I use this medication? This drug is given as an infusion into a vein. It is administered in a hospitalor clinic by a specially trained health care professional. Talk to your pediatrician regarding the use of this medicine in children.Special care may be needed. Overdosage: If you think you have taken too much of this medicine contact apoison control center or emergency room at once. NOTE: This medicine is only for you. Do not share this medicine with others. What if I miss a dose? It is important not to miss a dose. Call your doctor or health careprofessional if you are unable to keep an appointment. What may interact with this medication? Do not take this medicine with any of the following medications: cisapride dronedarone pimozide thioridazine This medicine may also interact with the following medications: aspirin and aspirin-like medicines certain medicines that treat or prevent blood clots like warfarin, apixaban, dabigatran, and rivaroxaban cisplatin cyclosporine diuretics medicines for infection like acyclovir, adefovir, amphotericin B, bacitracin, cidofovir, foscarnet, ganciclovir, gentamicin, pentamidine, vancomycin NSAIDs, medicines for pain and inflammation, like ibuprofen or naproxen other medicines that prolong the QT interval (an abnormal heart rhythm) pamidronate zoledronic acid This list may not describe all possible interactions. Give your health care provider a list of all the medicines, herbs, non-prescription drugs, or dietary supplements you use. Also tell them if you smoke, drink alcohol, or  use illegaldrugs. Some items may interact with your medicine. What should I watch for while using this medication? Your condition will be monitored carefully while you are receiving thismedicine. You may need blood work done while you are taking this medicine. This medicine may make you feel generally unwell. This is not uncommon as chemotherapy can affect healthy cells as well as cancer cells. Report any side effects. Continue your course of treatment even though you feel ill unless yourhealthcare professional tells you to stop. This medicine can make you more sensitive to cold. Do not drink cold drinks or use ice. Cover exposed skin before coming in contact with cold temperatures or cold objects. When out in cold weather wear warm clothing and cover your mouth and nose to warm the air that goes into your lungs. Tell your doctor if you getsensitive to the cold. Do not become pregnant while taking this medicine or for 9 months after stopping it. Women should inform their health care professional if they wish to become pregnant or think they might be pregnant. Men should not father a child while taking this medicine and for 6 months after stopping it. There is potential for serious side effects to an unborn child. Talk to your health careprofessional for more information. Do not breast-feed a child while taking this medicine or for 3 months afterstopping it. This medicine has caused ovarian failure in some women. This medicine may make it more difficult to get pregnant. Talk to your health care professional if [place] concerned about  your fertility. This medicine has caused decreased sperm counts in some men. This may make it more difficult to father a child. Talk to your health care professional if [place] concerned about your fertility. This medicine may increase your risk of getting an infection. Call your health care professional for advice if you get a fever, chills, or sore throat, or other symptoms of  a cold or flu. Do not treat yourself. Try to avoid beingaround people who are sick. Avoid taking medicines that contain aspirin, acetaminophen, ibuprofen, naproxen, or ketoprofen unless instructed by your health care professional.These medicines may hide a fever. Be careful brushing or flossing your teeth or using a toothpick because you may get an infection or bleed more easily. If you have any dental work done, Primary school teacher you are receiving this medicine. What side effects may I notice from receiving this medication? Side effects that you should report to your doctor or health care professionalas soon as possible: allergic reactions like skin rash, itching or hives, swelling of the face, lips, or tongue breathing problems cough low blood counts - this medicine may decrease the number of white blood cells, red blood cells, and platelets. You may be at increased risk for infections and bleeding nausea, vomiting pain, redness, or irritation at site where injected pain, tingling, numbness in the hands or feet signs and symptoms of bleeding such as bloody or black, tarry stools; red or dark brown urine; spitting up blood or brown material that looks like coffee grounds; red spots on the skin; unusual bruising or bleeding from the eyes, gums, or nose signs and symptoms of a dangerous change in heartbeat or heart rhythm like chest pain; dizziness; fast, irregular heartbeat; palpitations; feeling faint or lightheaded; falls signs and symptoms of infection like fever; chills; cough; sore throat; pain or trouble passing urine signs and symptoms of liver injury like dark yellow or brown urine; general ill feeling or flu-like symptoms; light-colored stools; loss of appetite; nausea; right upper belly pain; unusually weak or tired; yellowing of the eyes or skin signs and symptoms of low red blood cells or anemia such as unusually weak or tired; feeling faint or lightheaded; falls signs and symptoms of  muscle injury like dark urine; trouble passing urine or change in the amount of urine; unusually weak or tired; muscle pain; back pain Side effects that usually do not require medical attention (report to yourdoctor or health care professional if they continue or are bothersome): changes in taste diarrhea gas hair loss loss of appetite mouth sores This list may not describe all possible side effects. Call your doctor for medical advice about side effects. You may report side effects to FDA at1-800-FDA-1088. Where should I keep my medication? This drug is given in a hospital or clinic and will not be stored at home. NOTE: This sheet is a summary. It may not cover all possible information. If you have questions about this medicine, talk to your doctor, pharmacist, orhealth care provider.  2022 Elsevier/Gold Standard (2019-01-12 12:20:35)  Leucovorin injection What is this medication? LEUCOVORIN (loo koe VOR in) is used to prevent or treat the harmful effects of some medicines. This medicine is used to treat anemia caused by a low amount of folic acid in the body. It is also used with 5-fluorouracil (5-FU) to treatcolon cancer. This medicine may be used for other purposes; ask your health care provider orpharmacist if you have questions. What should I tell my care team before I take this medication? They  need to know if you have any of these conditions: anemia from low levels of vitamin B-12 in the blood an unusual or allergic reaction to leucovorin, folic acid, other medicines, foods, dyes, or preservatives pregnant or trying to get pregnant breast-feeding How should I use this medication? This medicine is for injection into a muscle or into a vein. It is given by ahealth care professional in a hospital or clinic setting. Talk to your pediatrician regarding the use of this medicine in children.Special care may be needed. Overdosage: If you think you have taken too much of this medicine  contact apoison control center or emergency room at once. NOTE: This medicine is only for you. Do not share this medicine with others. What if I miss a dose? This does not apply. What may interact with this medication? capecitabine fluorouracil phenobarbital phenytoin primidone trimethoprim-sulfamethoxazole This list may not describe all possible interactions. Give your health care provider a list of all the medicines, herbs, non-prescription drugs, or dietary supplements you use. Also tell them if you smoke, drink alcohol, or use illegaldrugs. Some items may interact with your medicine. What should I watch for while using this medication? Your condition will be monitored carefully while you are receiving thismedicine. This medicine may increase the side effects of 5-fluorouracil, 5-FU. Tell your doctor or health care professional if you have diarrhea or mouth sores that donot get better or that get worse. What side effects may I notice from receiving this medication? Side effects that you should report to your doctor or health care professionalas soon as possible: allergic reactions like skin rash, itching or hives, swelling of the face, lips, or tongue breathing problems fever, infection mouth sores unusual bleeding or bruising unusually weak or tired Side effects that usually do not require medical attention (report to yourdoctor or health care professional if they continue or are bothersome): constipation or diarrhea loss of appetite nausea, vomiting This list may not describe all possible side effects. Call your doctor for medical advice about side effects. You may report side effects to FDA at1-800-FDA-1088. Where should I keep my medication? This drug is given in a hospital or clinic and will not be stored at home. NOTE: This sheet is a summary. It may not cover all possible information. If you have questions about this medicine, talk to your doctor, pharmacist, orhealth care  provider.  2022 Elsevier/Gold Standard (2008-02-29 16:50:29)  Fluorouracil, 5-FU injection What is this medication? FLUOROURACIL, 5-FU (flure oh YOOR a sil) is a chemotherapy drug. It slows the growth of cancer cells. This medicine is used to treat many types of cancer like breast cancer, colon or rectal cancer, pancreatic cancer, and stomachcancer. This medicine may be used for other purposes; ask your health care provider orpharmacist if you have questions. COMMON BRAND NAME(S): Adrucil What should I tell my care team before I take this medication? They need to know if you have any of these conditions: blood disorders dihydropyrimidine dehydrogenase (DPD) deficiency infection (especially a virus infection such as chickenpox, cold sores, or herpes) kidney disease liver disease malnourished, poor nutrition recent or ongoing radiation therapy an unusual or allergic reaction to fluorouracil, other chemotherapy, other medicines, foods, dyes, or preservatives pregnant or trying to get pregnant breast-feeding How should I use this medication? This drug is given as an infusion or injection into a vein. It is administeredin a hospital or clinic by a specially trained health care professional. Talk to your pediatrician regarding the use of this medicine in children.Special  care may be needed. Overdosage: If you think you have taken too much of this medicine contact apoison control center or emergency room at once. NOTE: This medicine is only for you. Do not share this medicine with others. What if I miss a dose? It is important not to miss your dose. Call your doctor or health careprofessional if you are unable to keep an appointment. What may interact with this medication? Do not take this medicine with any of the following medications: live virus vaccines This medicine may also interact with the following medications: medicines that treat or prevent blood clots like warfarin, enoxaparin,  and dalteparin This list may not describe all possible interactions. Give your health care provider a list of all the medicines, herbs, non-prescription drugs, or dietary supplements you use. Also tell them if you smoke, drink alcohol, or use illegaldrugs. Some items may interact with your medicine. What should I watch for while using this medication? Visit your doctor for checks on your progress. This drug may make you feel generally unwell. This is not uncommon, as chemotherapy can affect healthy cells as well as cancer cells. Report any side effects. Continue your course oftreatment even though you feel ill unless your doctor tells you to stop. In some cases, you may be given additional medicines to help with side effects.Follow all directions for their use. Call your doctor or health care professional for advice if you get a fever, chills or sore throat, or other symptoms of a cold or flu. Do not treat yourself. This drug decreases your body's ability to fight infections. Try toavoid being around people who are sick. This medicine may increase your risk to bruise or bleed. Call your doctor orhealth care professional if you notice any unusual bleeding. Be careful brushing and flossing your teeth or using a toothpick because you may get an infection or bleed more easily. If you have any dental work done,tell your dentist you are receiving this medicine. Avoid taking products that contain aspirin, acetaminophen, ibuprofen, naproxen, or ketoprofen unless instructed by your doctor. These medicines may hide afever. Do not become pregnant while taking this medicine. Women should inform their doctor if they wish to become pregnant or think they might be pregnant. There is a potential for serious side effects to an unborn child. Talk to your health care professional or pharmacist for more information. Do not breast-feed aninfant while taking this medicine. Men should inform their doctor if they wish to father a  child. This medicinemay lower sperm counts. Do not treat diarrhea with over the counter products. Contact your doctor ifyou have diarrhea that lasts more than 2 days or if it is severe and watery. This medicine can make you more sensitive to the sun. Keep out of the sun. If you cannot avoid being in the sun, wear protective clothing and use sunscreen.Do not use sun lamps or tanning beds/booths. What side effects may I notice from receiving this medication? Side effects that you should report to your doctor or health care professionalas soon as possible: allergic reactions like skin rash, itching or hives, swelling of the face, lips, or tongue low blood counts - this medicine may decrease the number of white blood cells, red blood cells and platelets. You may be at increased risk for infections and bleeding. signs of infection - fever or chills, cough, sore throat, pain or difficulty passing urine signs of decreased platelets or bleeding - bruising, pinpoint red spots on the skin, black, tarry stools, blood in  the urine signs of decreased red blood cells - unusually weak or tired, fainting spells, lightheadedness breathing problems changes in vision chest pain mouth sores nausea and vomiting pain, swelling, redness at site where injected pain, tingling, numbness in the hands or feet redness, swelling, or sores on hands or feet stomach pain unusual bleeding Side effects that usually do not require medical attention (report to yourdoctor or health care professional if they continue or are bothersome): changes in finger or toe nails diarrhea dry or itchy skin hair loss headache loss of appetite sensitivity of eyes to the light stomach upset unusually teary eyes This list may not describe all possible side effects. Call your doctor for medical advice about side effects. You may report side effects to FDA at1-800-FDA-1088. Where should I keep my medication? This drug is given in a hospital  or clinic and will not be stored at home. NOTE: This sheet is a summary. It may not cover all possible information. If you have questions about this medicine, talk to your doctor, pharmacist, orhealth care provider.  2022 Elsevier/Gold Standard (2019-07-26 15:00:03)

## 2021-03-18 NOTE — Progress Notes (Signed)
Patient had an abcsess drained at surgeon's office 5 days ago and the pain is 5-6/10 on pain scale.

## 2021-03-18 NOTE — Progress Notes (Signed)
Hematology/Oncology follow up note [facility] Telephone:([phone] Fax:([phone]   Patient Care Team: Pcp, No as PCP - General [name], RN as Oncology Nurse Navigator  REFERRING PROVIDER: Lorella Nimrod, MD  CHIEF COMPLAINTS/REASON FOR VISIT:  rectal cancer  HISTORY OF PRESENTING ILLNESS:   Barry Koskela. is a  55 y.o.  male with PMH listed below was seen in consultation at the request of  Lorella Nimrod, MD  for evaluation of rectal cancer  02/05/2021-02/06/2021 patient was hospitalized due to generalized weakness, intermittent lightheadedness, weight loss and worsening constipation.  02/05/2021 CT abdomen showed concerning of severe rectal wall thickening and right internal iliac lymph node concerning for metastatic disease.  Patient was seen by gastroenterology and had colonoscopy which showed a circumferential fungating mass in the rectum.  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  02/14/2021-02/16/2021 hospitalized due to rectal bleeding and pain.   02/14/2021 CT showed perirectal fluid collection/gas concerning for infection with significant leukocytosis, anemia with hemoglobin of 6.8.  Patient received PRBC transfusion, IV antibiotics.  He underwent an IR guided placement of JP drain into the rectal abscess.  Discharged home with oral Augmentin. 02/20/2021 presented to ER with new skin opening and draining from left gluteus.  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  JP drain has been removed.  Patient was continued on Augmentin.  02/13/2021, PET scan showed locally advanced rectal cancer with billowing of the mesorectum now with low attenuation material that was not present on previous examination with extensive stranding and inflammation.   Bulky RIGHT pelvic sidewall/hypogastric lymph node outside of the mesorectum with stippled calcification measuring 19 mm-no increased metabolic activity. Small LEFT hypogastric  lymph node just peripheral to the internal external bifurcation-SUV 3.2 High RIGHT internal just below or at the internal/common iliac transition, lymph node -SUV 4.8 LEFT high hypogastric lymph node  8 mm-SUV 3. Scattered lymph nodes throughout the retroperitoneum with low FDG uptake Bilateral inguinal lymph nodes largest on the RIGHT (image 248/3) 11 mm with a maximum SUV of 2.8  spiculated nodule in the LEFT upper lobe- 9 x 8 mm  02/14/2021, CT abdomen pelvis without contrast Showed perforated rectal mass with contained perforation with fluid and gas extending above and below the pelvic floor,potentially involving the sphincter complex and extending into LEFT ischial rectal fossa  02/20/2021, CT pelvis with contrast showed Large perirectal/perianal abscess has markedly decreased in size since placement of the percutaneous drain. There are residual gas-filled collections in the soft tissues and suspect there is a fistula or sinus tract between the rectal mass and the subcutaneous tissues. Soft tissue gas along the medial left buttock and concern for a cutaneous ulceration in this area. Large rectal mass with evidence for a large necrotic right pelvic lymph node.  02/27/2021 medi port placed by Dr.Dew  INTERVAL HISTORY Barry Trauger. is a 55 y.o. male who has above history reviewed by me today presents for follow up visit for management of rectal cancer. Problems and complaints are listed below:   # Patient called to report that he developed a boil on right butt cheek and also left perianal area fullness.he was seen by Dr.Pabon urgently on 03/13/21 and had right buttock abscess drained. Left perianal fullness was felt to be due to cancer. Patient was continued on Augmentation per my instruction.  Today he reports right buttock abscess is closing up.  Still has perianal pain, 5/10.  No abdominal pain, nausea, vomiting. No fever or  chills.  No difficulty in passing bowel movement.  Fatigue is  slightly better.   Review of Systems  Constitutional:  Positive for fatigue. Negative for appetite change, chills, fever and unexpected weight change.  HENT:   Negative for hearing loss and voice change.   Eyes:  Negative for eye problems and icterus.  Respiratory:  Negative for chest tightness, cough and shortness of breath.   Cardiovascular:  Negative for chest pain and leg swelling.  Gastrointestinal:  Positive for blood in stool and rectal pain. Negative for abdominal distention, abdominal pain and constipation.  Endocrine: Negative for hot flashes.  Genitourinary:  Negative for difficulty urinating, dysuria and frequency.   Musculoskeletal:  Negative for arthralgias.  Skin:  Negative for itching and rash.  Neurological:  Negative for light-headedness and numbness.  Hematological:  Negative for adenopathy. Does not bruise/bleed easily.  Psychiatric/Behavioral:  Negative for confusion.    MEDICAL HISTORY:  Past Medical History:  Diagnosis Date   Headache    Left shoulder pain    Rectal cancer ([place])     SURGICAL HISTORY: Past Surgical History:  Procedure Laterality Date   COLONOSCOPY WITH PROPOFOL N/A 02/06/2021   Procedure: COLONOSCOPY WITH PROPOFOL;  Surgeon: Lin Landsman, MD;  Location: [facility] ENDOSCOPY;  Service: Gastroenterology;  Laterality: N/A;   PORTA CATH INSERTION N/A 02/27/2021   Procedure: PORTA CATH INSERTION;  Surgeon: Algernon Huxley, MD;  Location: [facility];  Service: Cardiovascular;  Laterality: N/A;   ROTATOR CUFF REPAIR Left     SOCIAL HISTORY: Social History   Socioeconomic History   Marital status: Married    Spouse name: Not on file   Number of children: Not on file   Years of education: Not on file   Highest education level: Not on file  Occupational History   Not on file  Tobacco Use   Smoking status: Former    Packs/day: 1.00    Years: 10.00    Pack years: 10.00    Types: Cigars, Cigarettes    Quit date: 02/04/2021    Years  since quitting: 0.1   Smokeless tobacco: Never  Substance and Sexual Activity   Alcohol use: Not Currently   Drug use: No   Sexual activity: Not on file  Other Topics Concern   Not on file  Social History Narrative   Not on file   Social Determinants of Health   Financial Resource Strain: Not on file  Food Insecurity: Not on file  Transportation Needs: Not on file  Physical Activity: Not on file  Stress: Not on file  Social Connections: Not on file  Intimate Partner Violence: Not on file    FAMILY HISTORY: Family History  Problem Relation Age of Onset   Cancer Sister    Diabetes Mother    Cancer Maternal Grandmother    Cancer Paternal Grandmother     ALLERGIES:  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  MEDICATIONS:  Current Outpatient Medications  Medication Sig Dispense Refill   acetaminophen (TYLENOL) 325 MG tablet Take 650 mg by mouth every 6 (six) hours as needed.     amoxicillin-clavulanate (AUGMENTIN) 875-125 MG tablet Take 1 tablet by mouth 2 (two) times daily. 28 tablet 0   feeding supplement (ENSURE ENLIVE / ENSURE PLUS) LIQD Take 237 mLs by mouth daily. 237 mL 12   ferrous sulfate 325 (65 FE) MG tablet Take 1 tablet (325 mg total) by mouth 2 (two) times daily with a meal. 60 tablet 3   HYDROcodone-acetaminophen (  NORCO) 5-325 MG tablet Take 1 tablet by mouth every 6 (six) hours as needed for moderate pain. 60 tablet 0   ibuprofen (ADVIL) 400 MG tablet Take 1 tablet (400 mg total) by mouth every 6 (six) hours as needed. 30 tablet 0   lidocaine-prilocaine (EMLA) cream Apply to affected area once 30 g 3   nicotine (NICODERM CQ - DOSED IN MG/24 HOURS) 21 mg/24hr patch Place 1 patch (21 mg total) onto the skin daily. 28 patch 0   ondansetron (ZOFRAN) 8 MG tablet Take 1 tablet (8 mg total) by mouth 2 (two) times daily as needed for refractory nausea / vomiting. Start on day 3 after chemotherapy. 30 tablet 1   prochlorperazine (COMPAZINE) 10 MG tablet Take 1 tablet (10 mg  total) by mouth every 6 (six) hours as needed (Nausea or vomiting). 30 tablet 1   senna-docusate (SENOKOT-S) 8.6-50 MG tablet Take 2 tablets by mouth daily. 120 tablet 0   No current facility-administered medications for this visit.   Facility-Administered Medications Ordered in Other Visits  Medication Dose Route Frequency Provider Last Rate Last Admin   dexamethasone (DECADRON) 10 mg in sodium chloride 0.9 % 50 mL IVPB  10 mg Intravenous Once Earlie Server, MD       fluorouracil (ADRUCIL) 4,350 mg in sodium chloride 0.9 % 63 mL chemo infusion  2,400 mg/m2 (Treatment Plan Recorded) Intravenous 1 day or 1 dose Earlie Server, MD       fluorouracil (ADRUCIL) chemo injection 750 mg  400 mg/m2 (Treatment Plan Recorded) Intravenous Once Earlie Server, MD       leucovorin 750 mg in dextrose 5 % 250 mL infusion  750 mg Intravenous Once Earlie Server, MD       oxaliplatin (ELOXATIN) 150 mg in dextrose 5 % 500 mL chemo infusion  150 mg Intravenous Once Earlie Server, MD       palonosetron (ALOXI) injection 0.25 mg  0.25 mg Intravenous Once Earlie Server, MD         PHYSICAL EXAMINATION: ECOG PERFORMANCE STATUS: 1 - Symptomatic but completely ambulatory Vitals:   03/18/21 0843  BP: 128/80  Pulse: 94  Resp: 16  Temp: (!) 97.5 F (36.4 C)   Filed Weights   03/18/21 0843  Weight: 156 lb (70.8 kg)    Physical Exam Constitutional:      Comments: distressed due to rectal discomfort  HENT:     Head: Normocephalic and atraumatic.  Eyes:     General: No scleral icterus. Cardiovascular:     Rate and Rhythm: Normal rate and regular rhythm.     Heart sounds: Normal heart sounds.  Pulmonary:     Effort: Pulmonary effort is normal. No respiratory distress.     Breath sounds: No wheezing.  Abdominal:     General: Bowel sounds are normal. There is no distension.     Palpations: Abdomen is soft.  Musculoskeletal:        General: No deformity. Normal range of motion.     Cervical back: Normal range of motion and neck supple.   Skin:    General: Skin is warm and dry.     Findings: No erythema or rash.  Neurological:     Mental Status: He is alert and oriented to person, place, and time. Mental status is at baseline.     Cranial Nerves: No cranial nerve deficit.     Coordination: Coordination normal.  Psychiatric:        Mood and Affect: Mood normal.  LABORATORY DATA:  I have reviewed the data as listed Lab Results  Component Value Date   WBC 11.2 (H) 03/18/2021   HGB 9.8 (L) 03/18/2021   HCT 30.7 (L) 03/18/2021   MCV 76.2 (L) 03/18/2021   PLT 548 (H) 03/18/2021   Recent Labs    02/21/21 1331 03/06/21 1304 03/18/21 0804  NA 134* 135 133*  K 4.3 4.0 4.0  CL 98 99 102  CO2 27 28 26   GLUCOSE 198* 90 125*  BUN 14 8 14   CREATININE 0.93 0.88 0.84  CALCIUM 8.7* 8.8* 8.7*  GFRNONAA >60 >60 >60  PROT 8.2* 8.1 8.0  ALBUMIN 2.3* 3.0* 3.2*  AST 14* 14* 17  ALT 19 15 16   ALKPHOS 86 78 72  BILITOT <0.1* 0.3 0.2*    Iron/TIBC/Ferritin/ %Sat    Component Value Date/Time   IRON 25 (L) 03/06/2021 1304   TIBC 328 03/06/2021 1304   FERRITIN 84 03/06/2021 1304   IRONPCTSAT 8 (L) 03/06/2021 1304      RADIOGRAPHIC STUDIES: I have personally reviewed the radiological images as listed and agreed with the findings in the report. CT PELVIS W CONTRAST  Result Date: 02/20/2021 CLINICAL DATA:  55 year old with rectal cancer with a percutaneous drain. Patient complains of fluid leaking around the drain. EXAM: CT PELVIS WITH CONTRAST TECHNIQUE: Multidetector CT imaging of the pelvis was performed using the standard protocol following the bolus administration of intravenous contrast. CONTRAST:  148mL OMNIPAQUE IOHEXOL 300 MG/ML  SOLN COMPARISON:  CT 02/14/2021 FINDINGS: Urinary Tract: Fluid-filled urinary bladder. Right kidney is only partially imaged. Bowel: Known large rectal mass with circumferential thickening in the rectum and anal region. No significant dilatation of the sigmoid colon or other visualized  portions of the colon. Normal caliber of the visualized small bowel. Vascular/Lymphatic: Extensive atherosclerotic calcifications. Again noted is low-density nodule or lymph node in the along the right pelvic wall that measures 3.1 x 2.5 cm on sequence 5 image 31. Prominent inguinal lymph nodes bilaterally. Reproductive:  Prostate is poorly visualized. Other: Percutaneous drain was placed in the perirectal/perianal abscess on 02/15/2021. The large perirectal abscess has markedly decreased in size. Small residual pocket of gas and fluid just inferior to the drain on sequence 5, image 48 that measures 3.0 x 2.4 x 2.2 cm. There is new gas in the subcutaneous tissues of the medial left buttock and there is concern for an ulceration or wound in this area. Scattered gas-filled collections along the medial left buttock and perirectal region. Cannot exclude a fistula connection between the rectal mass and the subcutaneous gas-filled collections. Musculoskeletal: No acute bone abnormality. IMPRESSION: 1. Large perirectal/perianal abscess has markedly decreased in size since placement of the percutaneous drain. There are residual gas-filled collections in the soft tissues and suspect there is a fistula or sinus tract between the rectal mass and the subcutaneous tissues. 2. Soft tissue gas along the medial left buttock and concern for a cutaneous ulceration in this area. 3. Large rectal mass with evidence for a large necrotic right pelvic lymph node. Electronically Signed   By: Markus Daft M.D.   On: 02/20/2021 15:42   MR Pelvis Wo Contrast  Result Date: 03/12/2021 CLINICAL DATA:  Newly diagnosed rectal carcinoma. EXAM: MRI PELVIS WITHOUT CONTRAST TECHNIQUE: Multiplanar multisequence MR imaging of the pelvis was performed. No intravenous contrast was administered. COMPARISON:  None. FINDINGS: TUMOR LOCATION Tumor distance from Anal Verge/Skin Surface: 1.4 cm Tumor distance from Internal Anal Sphincter: 0 cm; the tumor  directly  involves the upper and mid anal sphincter TUMOR DESCRIPTION Circumferential Extent: 100% Tumor Length: 11.2 cm T - CATEGORY Extension through Muscularis Propria: Yes, measuring up to 23 mm = T3d Shortest Distance from Mass to Mesorectal Fascia: 0 mm Extramural Vascular Invasion/Tumor Thrombus: No Invasion of Anterior Peritoneal Reflection: No Involvement of Adjacent Organs or Pelvic Sidewall: No Levator Ani Involvement: Yesa N - CATEGORY Mesorectal Lymph Nodes >=77mm: 4 or more=N2 Extra-mesorectal Lymphadenopathy: Yes, bilateral internal iliac lymphadenopathy is seen, with largest on the right measuring 2.3 cm in short axis = N2. Shotty less than 1 cm bilateral inguinal lymph nodes are seen, which are not pathologically enlarged. Other:  None. IMPRESSION: Assuming pathologic diagnosis of rectal adenocarcinoma, local staging is T3d, N2, Mx. This tumor shows involvement of the anal sphincter. Electronically Signed   By: Marlaine Hind M.D.   On: 03/12/2021 08:39   PERIPHERAL VASCULAR CATHETERIZATION  Result Date: 02/27/2021 See surgical note for result.      ASSESSMENT & PLAN:  1. Rectal cancer ([place])   2. Iron deficiency anemia due to chronic blood loss   3. Goals of care, counseling/discussion   4. Encounter for antineoplastic chemotherapy   5. Thrombocytosis   6. Lung nodule   7. Peri-rectal abscess   8. Neoplasm related pain   Cancer Staging Rectal cancer [facility]) Staging form: Colon and Rectum, AJCC 8th Edition - Clinical stage from 02/08/2021: Stage IIIB (cT4a, cN1, cM0) - Signed by Earlie Server, MD on 03/06/2021   #locally advanced rectal cancer. Images were independently reviewed by me and discussed with patient. His case is complicated with a perirectal abscess. Prior to onset of perirectal abscess, on his 02/05/2021 scan, he was noted to have right internal iliac lymph node 3 x 1 x 2.3 cm which is concerning for nodal disease.On Subsequent images it is difficult to distinguish whether  lymphadenopathy was due to nodal disease versus acute inflammation. 03/07/2021 MRI pelvis cT3 N2.  Due to the possible contained perforation on previous CT, possible cT4 disease.   Recommend neoadjuvant chemotherapy with FOLFOX x8 cycles, followed by concurrent chemoradiation followed by surgery.  Given that his case has been complicated with abscess.  Recommend patient to establish care with colorectal surgeon.  He understands that chemotherapy may lower his immune system and abscess may get worse.  He knows to call clinic if symptoms are worse. Continue Augmentin.    #Spiculated lung nodule, likely primary lung cancer. Discussed with IR, CT guided biopsy is difficult given the position and small size. Will monitor for now. His rectal cancer treatment takes priority.   #Iron deficiency anemia Status post IV Venofer treatments Hemoglobin improved to 9.8 We will schedule patient for additional IV Venofer treatment weekly x2.  #Leukocytosis due to recent.  Rectal abscess White count has trended down.  #Thrombocytosis, likely due to iron deficiency/ inflammation.  Anticipate to improve with improvement of iron stores.  #Rectal pain, continue Norco 5 mg every 6 hours as needed.    Orders Placed This Encounter  Procedures   Ferritin    Standing Status:   Future    Standing Expiration Date:   09/18/2021   Iron and TIBC    Standing Status:   Future    Standing Expiration Date:   03/18/2022     All questions were answered. The patient knows to call the clinic with any problems questions or concerns.  Return of visit:  1 week lab md IVF/venofer, 2 weeks lab md Folfox   Earlie Server,  MD, PhD Hematology Oncology [facility] Pager- [phone] 03/18/2021

## 2021-03-18 NOTE — Progress Notes (Signed)
Pt tolerated all infusions well today with no problems or complaints.  5FU pump connected to patient and infusing as ordered.  Pt left chemo suite stable and ambulatory.

## 2021-03-19 ENCOUNTER — Telehealth: Payer: Self-pay

## 2021-03-19 LAB — ANAEROBIC AND AEROBIC CULTURE: Result 2: NEGATIVE — AB

## 2021-03-19 NOTE — Telephone Encounter (Signed)
Telephone call to patient for follow up after receiving first infusion.   Patient states infusion went great.  States eating good and drinking plenty of fluids.   Denies any nausea or vomiting.  Encouraged patient to call for any concerns or questions. 

## 2021-03-20 ENCOUNTER — Other Ambulatory Visit: Payer: Self-pay

## 2021-03-20 ENCOUNTER — Encounter: Payer: Self-pay | Admitting: Oncology

## 2021-03-20 ENCOUNTER — Inpatient Hospital Stay: Payer: BC Managed Care – PPO

## 2021-03-20 DIAGNOSIS — C20 Malignant neoplasm of rectum: Secondary | ICD-10-CM | POA: Diagnosis not present

## 2021-03-20 MED ORDER — HEPARIN SOD (PORK) LOCK FLUSH 100 UNIT/ML IV SOLN
500.0000 [IU] | Freq: Once | INTRAVENOUS | Status: AC | PRN
  Administered 2021-03-20: 500 [IU]
  Filled 2021-03-20: qty 5

## 2021-03-20 NOTE — Patient Instructions (Signed)
[facility]  Discharge Instructions: Thank you for choosing [facility] to provide your oncology and hematology care.  If you have a lab appointment with the [facility], please go directly to the [facility] and check in at the registration area.  Wear comfortable clothing and clothing appropriate for easy access to any Portacath or PICC line.   We strive to give you quality time with your provider. You may need to reschedule your appointment if you arrive late (15 or more minutes).  Arriving late affects you and other patients whose appointments are after yours.  Also, if you miss three or more appointments without notifying the office, you may be dismissed from the clinic at the provider's discretion.      For prescription refill requests, have your pharmacy contact our office and allow 72 hours for refills to be completed.    Today you received the following chemotherapy and/or immunotherapy agents PORT-A-CATH      To help prevent nausea and vomiting after your treatment, we encourage you to take your nausea medication as directed.  BELOW ARE SYMPTOMS THAT SHOULD BE REPORTED IMMEDIATELY: *FEVER GREATER THAN 100.4 F (38 C) OR HIGHER *CHILLS OR SWEATING *NAUSEA AND VOMITING THAT IS NOT CONTROLLED WITH YOUR NAUSEA MEDICATION *UNUSUAL SHORTNESS OF BREATH *UNUSUAL BRUISING OR BLEEDING *URINARY PROBLEMS (pain or burning when urinating, or frequent urination) *BOWEL PROBLEMS (unusual diarrhea, constipation, pain near the anus) TENDERNESS IN MOUTH AND THROAT WITH OR WITHOUT PRESENCE OF ULCERS (sore throat, sores in mouth, or a toothache) UNUSUAL RASH, SWELLING OR PAIN  UNUSUAL VAGINAL DISCHARGE OR ITCHING   Items with * indicate a potential emergency and should be followed up as soon as possible or go to the Emergency Department if any problems should occur.  Please show the CHEMOTHERAPY ALERT CARD or IMMUNOTHERAPY ALERT CARD at check-in  to the Emergency Department and triage nurse.  Should you have questions after your visit or need to cancel or reschedule your appointment, please contact [facility]  [phone] and follow the prompts.  Office hours are 8:00 a.m. to 4:30 p.m. Monday - Friday. Please note that voicemails left after 4:00 p.m. may not be returned until the following business day.  We are closed weekends and major holidays. You have access to a nurse at all times for urgent questions. Please call the main number to the clinic [phone] and follow the prompts.  For any non-urgent questions, you may also contact your provider using MyChart. We now offer e-Visits for anyone 45 and older to request care online for non-urgent symptoms. For details visit [email or web address].   Also download the MyChart app! Go to the app store, search "MyChart", open the app, select [facility], and log in with your MyChart username and password.  Due to Covid, a mask is required upon entering the hospital/clinic. If you do not have a mask, one will be given to you upon arrival. For doctor visits, patients may have 1 support person aged 14 or older with them. For treatment visits, patients cannot have anyone with them due to current Covid guidelines and our immunocompromised population.   Implanted [facility] Guide An implanted port is a device that is placed under the skin. It is usually placed in the chest. The device can be used to give IV medicine, to take blood, or for dialysis. You may have an implanted port if: You need IV medicine that would be irritating to the  small veins in your hands or arms. You need IV medicines, such as antibiotics, for a long period of time. You need IV nutrition for a long period of time. You need dialysis. When you have a port, your health care provider can choose to use the port instead of veins in your arms for these procedures. You may have fewer limitations  when using a port than you would if you used other types of long-term IVs, and you will likely be able to return to normal activities afteryour incision heals. An implanted port has two main parts: Reservoir. The reservoir is the part where a needle is inserted to give medicines or draw blood. The reservoir is round. After it is placed, it appears as a small, raised area under your skin. Catheter. The catheter is a thin, flexible tube that connects the reservoir to a vein. Medicine that is inserted into the reservoir goes into the catheter and then into the vein. How is my port accessed? To access your port: A numbing cream may be placed on the skin over the port site. Your health care provider will put on a mask and sterile gloves. The skin over your port will be cleaned carefully with a germ-killing soap and allowed to dry. Your health care provider will gently pinch the port and insert a needle into it. Your health care provider will check for a blood return to make sure the port is in the vein and is not clogged. If your port needs to remain accessed to get medicine continuously (constant infusion), your health care provider will place a clear bandage (dressing) over the needle site. The dressing and needle will need to be changed every week, or as told by your health care provider. What is flushing? Flushing helps keep the port from getting clogged. Follow instructions from your health care provider about how and when to flush the port. Ports are usually flushed with saline solution or a medicine called heparin. The need for flushing will depend on how the port is used: If the port is only used from time to time to give medicines or draw blood, the port may need to be flushed: Before and after medicines have been given. Before and after blood has been drawn. As part of routine maintenance. Flushing may be recommended every 4-6 weeks. If a constant infusion is running, the port may not need to  be flushed. Throw away any syringes in a disposal container that is meant for sharp items (sharps container). You can buy a sharps container from a pharmacy, or you can make one by using an empty hard plastic bottle with a cover. How long will my port stay implanted? The port can stay in for as long as your health care provider thinks it is needed. When it is time for the port to come out, a surgery will be done to remove it. The surgery will be similar to the procedure that was done to putthe port in. Follow these instructions at home:  Flush your port as told by your health care provider. If you need an infusion over several days, follow instructions from your health care provider about how to take care of your port site. Make sure you: Wash your hands with soap and water before you change your dressing. If soap and water are not available, use alcohol-based hand sanitizer. Change your dressing as told by your health care provider. Place any used dressings or infusion bags into a plastic bag. Throw that  bag in the trash. Keep the dressing that covers the needle clean and dry. Do not get it wet. Do not use scissors or sharp objects near the tube. Keep the tube clamped, unless it is being used. Check your port site every day for signs of infection. Check for: Redness, swelling, or pain. Fluid or blood. Pus or a bad smell. Protect the skin around the port site. Avoid wearing bra straps that rub or irritate the site. Protect the skin around your port from seat belts. Place a soft pad over your chest if needed. Bathe or shower as told by your health care provider. The site may get wet as long as you are not actively receiving an infusion. Return to your normal activities as told by your health care provider. Ask your health care provider what activities are safe for you. Carry a medical alert card or wear a medical alert bracelet at all times. This will let health care providers know that you  have an implanted port in case of an emergency. Get help right away if: You have redness, swelling, or pain at the port site. You have fluid or blood coming from your port site. You have pus or a bad smell coming from the port site. You have a fever. Summary Implanted ports are usually placed in the chest for long-term IV access. Follow instructions from your health care provider about flushing the port and changing bandages (dressings). Take care of the area around your port by avoiding clothing that puts pressure on the area, and by watching for signs of infection. Protect the skin around your port from seat belts. Place a soft pad over your chest if needed. Get help right away if you have a fever or you have redness, swelling, pain, drainage, or a bad smell at the port site. This information is not intended to replace advice given to you by your health care provider. Make sure you discuss any questions you have with your healthcare provider. Document Revised: 01/09/2020 Document Reviewed: 01/09/2020 Elsevier Patient Education  [place].

## 2021-03-21 ENCOUNTER — Encounter: Payer: Self-pay | Admitting: Oncology

## 2021-03-21 ENCOUNTER — Ambulatory Visit: Payer: Self-pay

## 2021-03-22 ENCOUNTER — Telehealth: Payer: Self-pay | Admitting: *Deleted

## 2021-03-22 ENCOUNTER — Other Ambulatory Visit: Payer: Self-pay | Admitting: Oncology

## 2021-03-22 ENCOUNTER — Encounter: Payer: Self-pay | Admitting: Nurse Practitioner

## 2021-03-22 MED ORDER — HYDROCODONE-ACETAMINOPHEN 5-325 MG PO TABS: 1.0000 | ORAL_TABLET | Freq: Four times a day (QID) | ORAL | 0 refills | Status: DC | PRN

## 2021-03-22 MED ORDER — FENTANYL 50 MCG/HR TD PT72: 1.0000 | MEDICATED_PATCH | TRANSDERMAL | 0 refills | Status: DC

## 2021-03-22 NOTE — Telephone Encounter (Signed)
Expedited PA request for Norco submitted via Cover My Meds (key [id number]) is pending at this time with message:  [employer] is reviewing your PA request. Typically an electronic response will be received within 24-72 hours. To check for an update later, open this request from your dashboard.

## 2021-03-22 NOTE — Telephone Encounter (Signed)
Patient reports that he is almost out of Norco for pain, he stated that the Norco was not really helping and would like to try a different medication.

## 2021-03-22 NOTE — Telephone Encounter (Signed)
Patient needs prior auth for medication ordered today.  [phone] Patient ID [id number]

## 2021-03-25 ENCOUNTER — Other Ambulatory Visit: Payer: Self-pay | Admitting: Oncology

## 2021-03-25 ENCOUNTER — Encounter: Payer: Self-pay | Admitting: Oncology

## 2021-03-25 ENCOUNTER — Inpatient Hospital Stay: Payer: BC Managed Care – PPO

## 2021-03-25 ENCOUNTER — Inpatient Hospital Stay (HOSPITAL_BASED_OUTPATIENT_CLINIC_OR_DEPARTMENT_OTHER): Payer: BC Managed Care – PPO | Admitting: Oncology

## 2021-03-25 ENCOUNTER — Other Ambulatory Visit: Payer: Self-pay

## 2021-03-25 VITALS — BP 119/89 | HR 108 | Temp 98.6°F | Resp 17 | Wt 155.5 lb

## 2021-03-25 DIAGNOSIS — C20 Malignant neoplasm of rectum: Secondary | ICD-10-CM | POA: Diagnosis not present

## 2021-03-25 DIAGNOSIS — G893 Neoplasm related pain (acute) (chronic): Secondary | ICD-10-CM | POA: Diagnosis not present

## 2021-03-25 DIAGNOSIS — D500 Iron deficiency anemia secondary to blood loss (chronic): Secondary | ICD-10-CM | POA: Diagnosis not present

## 2021-03-25 DIAGNOSIS — R911 Solitary pulmonary nodule: Secondary | ICD-10-CM

## 2021-03-25 DIAGNOSIS — K611 Rectal abscess: Secondary | ICD-10-CM

## 2021-03-25 LAB — CBC WITH DIFFERENTIAL/PLATELET
Eosinophils Absolute: 0.4 10*3/uL (ref 0.0–0.5)
Eosinophils Relative: 5 %
HCT: 29.8 % — ABNORMAL LOW (ref 39.0–52.0)
Hemoglobin: 9.4 g/dL — ABNORMAL LOW (ref 13.0–17.0)
Lymphocytes Relative: 25 %
Lymphs Abs: 2.1 10*3/uL (ref 0.7–4.0)
MCH: 24.4 pg — ABNORMAL LOW (ref 26.0–34.0)
MCHC: 31.5 g/dL (ref 30.0–36.0)
MCV: 77.2 fL — ABNORMAL LOW (ref 80.0–100.0)
Monocytes Absolute: 0.7 10*3/uL (ref 0.1–1.0)
Monocytes Relative: 9 %
Neutro Abs: 5.2 10*3/uL (ref 1.7–7.7)
Neutrophils Relative %: 61 %
Platelets: 502 10*3/uL — ABNORMAL HIGH (ref 150–400)
RBC: 3.86 MIL/uL — ABNORMAL LOW (ref 4.22–5.81)
WBC: 8.5 10*3/uL (ref 4.0–10.5)

## 2021-03-25 LAB — COMPREHENSIVE METABOLIC PANEL
ALT: 13 U/L (ref 0–44)
AST: 16 U/L (ref 15–41)
Albumin: 3.6 g/dL (ref 3.5–5.0)
Alkaline Phosphatase: 71 U/L (ref 38–126)
Anion gap: 7 (ref 5–15)
BUN: 11 mg/dL (ref 6–20)
CO2: 25 mmol/L (ref 22–32)
Calcium: 8.6 mg/dL — ABNORMAL LOW (ref 8.9–10.3)
Chloride: 101 mmol/L (ref 98–111)
Creatinine, Ser: 0.93 mg/dL (ref 0.61–1.24)
GFR, Estimated: >60 mL/min (ref >60)
Glucose, Bld: 104 mg/dL — ABNORMAL HIGH (ref 70–99)
Potassium: 3.6 mmol/L (ref 3.5–5.1)
Sodium: 133 mmol/L — ABNORMAL LOW (ref 135–145)
Total Bilirubin: 0.4 mg/dL (ref 0.3–1.2)
Total Protein: 8 g/dL (ref 6.5–8.1)

## 2021-03-25 LAB — IRON AND TIBC
Iron: 28 ug/dL — ABNORMAL LOW (ref 45–182)
Saturation Ratios: 8 % — ABNORMAL LOW (ref 17.9–39.5)
TIBC: 356 ug/dL (ref 250–450)
UIBC: 328 ug/dL

## 2021-03-25 LAB — FERRITIN: Ferritin: 118 ng/mL (ref 24–336)

## 2021-03-25 MED ORDER — IRON SUCROSE 20 MG/ML IV SOLN
200.0000 mg | Freq: Once | INTRAVENOUS | Status: AC
  Administered 2021-03-25: 200 mg via INTRAVENOUS
  Filled 2021-03-25: qty 10

## 2021-03-25 MED ORDER — HEPARIN SOD (PORK) LOCK FLUSH 100 UNIT/ML IV SOLN
500.0000 [IU] | Freq: Once | INTRAVENOUS | Status: AC | PRN
  Administered 2021-03-25: 500 [IU]
  Filled 2021-03-25: qty 5

## 2021-03-25 MED ORDER — HEPARIN SOD (PORK) LOCK FLUSH 100 UNIT/ML IV SOLN
INTRAVENOUS | Status: AC
  Filled 2021-03-25: qty 5

## 2021-03-25 MED ORDER — SODIUM CHLORIDE 0.9 % IV SOLN: 200.0000 mg | Freq: Once | INTRAVENOUS | Status: DC

## 2021-03-25 MED ORDER — SODIUM CHLORIDE 0.9 % IV SOLN
Freq: Once | INTRAVENOUS | Status: AC
  Filled 2021-03-25: qty 250

## 2021-03-25 NOTE — Patient Instructions (Signed)

## 2021-03-25 NOTE — Progress Notes (Signed)
Hematology/Oncology follow up note [facility] Telephone:(336) [phone] Fax:(336) [phone]   Patient Care Team: Pcp, No as PCP - General Barry Jacks, RN as Oncology Nurse Navigator  REFERRING PROVIDER: Lorella Nimrod, MD  CHIEF COMPLAINTS/REASON FOR VISIT:  Follow up for rectal cancer treatment  HISTORY OF PRESENTING ILLNESS:   Barry [name]. is a  55 y.o.  male with PMH listed below was seen in consultation at the request of  Barry Nimrod, MD  for evaluation of rectal cancer  02/05/2021-02/06/2021 patient was hospitalized due to generalized weakness, intermittent lightheadedness, weight loss and worsening constipation.  02/05/2021 CT abdomen showed concerning of severe rectal wall thickening and right internal iliac lymph node concerning for metastatic disease.  Patient was seen by gastroenterology and had colonoscopy which showed a circumferential fungating mass in the rectum.  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  02/14/2021-02/16/2021 hospitalized due to rectal bleeding and pain.   02/14/2021 CT showed perirectal fluid collection/gas concerning for infection with significant leukocytosis, anemia with hemoglobin of 6.8.  Patient received PRBC transfusion, IV antibiotics.  He underwent an IR guided placement of JP drain into the rectal abscess.  Discharged home with oral Augmentin. 02/20/2021 presented to ER with new skin opening and draining from left gluteus.  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  JP drain has been removed.  Patient was continued on Augmentin.  02/13/2021, PET scan showed locally advanced rectal cancer with billowing of the mesorectum now with low attenuation material that was not present on previous examination with extensive stranding and inflammation.   Bulky RIGHT pelvic sidewall/hypogastric lymph node outside of the mesorectum with stippled calcification measuring 19 mm-no increased metabolic  activity. Small LEFT hypogastric lymph node just peripheral to the internal external bifurcation-SUV 3.2 High RIGHT internal just below or at the internal/common iliac transition, lymph node -SUV 4.8 LEFT high hypogastric lymph node  8 mm-SUV 3. Scattered lymph nodes throughout the retroperitoneum with low FDG uptake Bilateral inguinal lymph nodes largest on the RIGHT (image 248/3) 11 mm with a maximum SUV of 2.8  spiculated nodule in the LEFT upper lobe- 9 x 8 mm  02/14/2021, CT abdomen pelvis without contrast Showed perforated rectal mass with contained perforation with fluid and gas extending above and below the pelvic floor,potentially involving the sphincter complex and extending into LEFT ischial rectal fossa  02/20/2021, CT pelvis with contrast showed Large perirectal/perianal abscess has markedly decreased in size since placement of the percutaneous drain. There are residual gas-filled collections in the soft tissues and suspect there is a fistula or sinus tract between the rectal mass and the subcutaneous tissues. Soft tissue gas along the medial left buttock and concern for a cutaneous ulceration in this area. Large rectal mass with evidence for a large necrotic right pelvic lymph node.   02/27/2021 medi port placed by Dr.Dew 03/13/2021 reports right butt cheek and also left perianal area fullness.he was seen by Dr.Pabon urgently  and had right buttock abscess drained. Left perianal fullness was felt to be due to cancer.   03/13/2021 started [place] Barry Jr. is a 55 y.o. male who has above history reviewed by me today presents for follow up visit for management of rectal cancer. Problems and complaints are listed below:  .  perianal pain has improved a lot after starting Fentanyl patch. He does not need to use short aciting narcotics.  No difficulty passing bowel movements. No blood in the stool.  No  fever or chills.  Appetite has improved.    Review of  Systems  Constitutional:  Positive for fatigue. Negative for appetite change, chills, fever and unexpected weight change.  HENT:   Negative for hearing loss and voice change.   Eyes:  Negative for eye problems and icterus.  Respiratory:  Negative for chest tightness, cough and shortness of breath.   Cardiovascular:  Negative for chest pain and leg swelling.  Gastrointestinal:  Positive for blood in stool and rectal pain. Negative for abdominal distention, abdominal pain and constipation.  Endocrine: Negative for hot flashes.  Genitourinary:  Negative for difficulty urinating, dysuria and frequency.   Musculoskeletal:  Negative for arthralgias.  Skin:  Negative for itching and rash.  Neurological:  Negative for light-headedness and numbness.  Hematological:  Negative for adenopathy. Does not bruise/bleed easily.  Psychiatric/Behavioral:  Negative for confusion.    MEDICAL HISTORY:  Past Medical History:  Diagnosis Date   Headache    Left shoulder pain    Rectal cancer ([place])     SURGICAL HISTORY: Past Surgical History:  Procedure Laterality Date   COLONOSCOPY WITH PROPOFOL N/A 02/06/2021   Procedure: COLONOSCOPY WITH PROPOFOL;  Surgeon: Barry Landsman, MD;  Location: [facility] ENDOSCOPY;  Service: Gastroenterology;  Laterality: N/A;   PORTA CATH INSERTION N/A 02/27/2021   Procedure: PORTA CATH INSERTION;  Surgeon: Barry Huxley, MD;  Location: [facility] CV LAB;  Service: Cardiovascular;  Laterality: N/A;   ROTATOR CUFF REPAIR Left     SOCIAL HISTORY: Social History   Socioeconomic History   Marital status: Married    Spouse name: Not on file   Number of children: Not on file   Years of education: Not on file   Highest education level: Not on file  Occupational History   Not on file  Tobacco Use   Smoking status: Former    Packs/day: 1.00    Years: 10.00    Pack years: 10.00    Types: Cigars, Cigarettes    Quit date: 02/04/2021    Years since quitting: 0.1   Smokeless  tobacco: Never  Substance and Sexual Activity   Alcohol use: Not Currently   Drug use: No   Sexual activity: Not on file  Other Topics Concern   Not on file  Social History Narrative   Not on file   Social Determinants of Health   Financial Resource Strain: Not on file  Food Insecurity: Not on file  Transportation Needs: Not on file  Physical Activity: Not on file  Stress: Not on file  Social Connections: Not on file  Intimate Partner Violence: Not on file    FAMILY HISTORY: Family History  Problem Relation Age of Onset   Cancer Sister    Diabetes Mother    Cancer Maternal Grandmother    Cancer Paternal Grandmother     ALLERGIES:  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  MEDICATIONS:  Current Outpatient Medications  Medication Sig Dispense Refill   amoxicillin-clavulanate (AUGMENTIN) 875-125 MG tablet Take 1 tablet by mouth 2 (two) times daily. 28 tablet 0   feeding supplement (ENSURE ENLIVE / ENSURE PLUS) LIQD Take 237 mLs by mouth daily. 237 mL 12   fentaNYL (DURAGESIC) 50 MCG/HR Place 1 patch onto the skin every 3 (three) days. 5 patch 0   ferrous sulfate 325 (65 FE) MG tablet Take 1 tablet (325 mg total) by mouth 2 (two) times daily with a meal. 60 tablet 3   HYDROcodone-acetaminophen (NORCO) 5-325 MG tablet Take 1  tablet by mouth every 6 (six) hours as needed for moderate pain. 90 tablet 0   ibuprofen (ADVIL) 400 MG tablet Take 1 tablet (400 mg total) by mouth every 6 (six) hours as needed. 30 tablet 0   lidocaine-prilocaine (EMLA) cream Apply to affected area once 30 g 3   nicotine (NICODERM CQ - DOSED IN MG/24 HOURS) 21 mg/24hr patch Place 1 patch (21 mg total) onto the skin daily. 28 patch 0   ondansetron (ZOFRAN) 8 MG tablet Take 1 tablet (8 mg total) by mouth 2 (two) times daily as needed for refractory nausea / vomiting. Start on day 3 after chemotherapy. 30 tablet 1   prochlorperazine (COMPAZINE) 10 MG tablet Take 1 tablet (10 mg total) by mouth every 6 (six) hours  as needed (Nausea or vomiting). 30 tablet 1   senna-docusate (SENOKOT-S) 8.6-50 MG tablet Take 2 tablets by mouth daily. 120 tablet 0   No current facility-administered medications for this visit.     PHYSICAL EXAMINATION: ECOG PERFORMANCE STATUS: 1 - Symptomatic but completely ambulatory There were no vitals filed for this visit.  Filed Weights   03/25/21 1044  Weight: 155 lb 8 oz (70.5 kg)    Physical Exam Constitutional:      Comments: She ambulates independently.  HENT:     Head: Normocephalic and atraumatic.  Eyes:     General: No scleral icterus. Cardiovascular:     Rate and Rhythm: Normal rate and regular rhythm.     Heart sounds: Normal heart sounds.  Pulmonary:     Effort: Pulmonary effort is normal. No respiratory distress.     Breath sounds: No wheezing.  Abdominal:     General: Bowel sounds are normal. There is no distension.     Palpations: Abdomen is soft.  Musculoskeletal:        General: No deformity. Normal range of motion.     Cervical back: Normal range of motion and neck supple.  Skin:    General: Skin is warm and dry.     Findings: No erythema or rash.  Neurological:     Mental Status: He is alert and oriented to person, place, and time. Mental status is at baseline.     Cranial Nerves: No cranial nerve deficit.     Coordination: Coordination normal.  Psychiatric:        Mood and Affect: Mood normal.    LABORATORY DATA:  I have reviewed the data as listed Lab Results  Component Value Date   WBC 11.2 (H) 03/18/2021   HGB 9.8 (L) 03/18/2021   HCT 30.7 (L) 03/18/2021   MCV 76.2 (L) 03/18/2021   PLT 548 (H) 03/18/2021   Recent Labs    02/21/21 1331 03/06/21 1304 03/18/21 0804  NA 134* 135 133*  K 4.3 4.0 4.0  CL 98 99 102  CO2 27 28 26   GLUCOSE 198* 90 125*  BUN 14 8 14   CREATININE 0.93 0.88 0.84  CALCIUM 8.7* 8.8* 8.7*  GFRNONAA >60 >60 >60  PROT 8.2* 8.1 8.0  ALBUMIN 2.3* 3.0* 3.2*  AST 14* 14* 17  ALT 19 15 16   ALKPHOS 86  78 72  BILITOT <0.1* 0.3 0.2*    Iron/TIBC/Ferritin/ %Sat    Component Value Date/Time   IRON 25 (L) 03/06/2021 1304   TIBC 328 03/06/2021 1304   FERRITIN 84 03/06/2021 1304   IRONPCTSAT 8 (L) 03/06/2021 1304      RADIOGRAPHIC STUDIES: I have personally reviewed the radiological images as listed  and agreed with the findings in the report. MR Pelvis Wo Contrast  Result Date: 03/12/2021 CLINICAL DATA:  Newly diagnosed rectal carcinoma. EXAM: MRI PELVIS WITHOUT CONTRAST TECHNIQUE: Multiplanar multisequence MR imaging of the pelvis was performed. No intravenous contrast was administered. COMPARISON:  None. FINDINGS: TUMOR LOCATION Tumor distance from Anal Verge/Skin Surface: 1.4 cm Tumor distance from Internal Anal Sphincter: 0 cm; the tumor directly involves the upper and mid anal sphincter TUMOR DESCRIPTION Circumferential Extent: 100% Tumor Length: 11.2 cm T - CATEGORY Extension through Muscularis Propria: Yes, measuring up to 23 mm = T3d Shortest Distance from Mass to Mesorectal Fascia: 0 mm Extramural Vascular Invasion/Tumor Thrombus: No Invasion of Anterior Peritoneal Reflection: No Involvement of Adjacent Organs or Pelvic Sidewall: No Levator Ani Involvement: Yesa N - CATEGORY Mesorectal Lymph Nodes >=14mm: 4 or more=N2 Extra-mesorectal Lymphadenopathy: Yes, bilateral internal iliac lymphadenopathy is seen, with largest on the right measuring 2.3 cm in short axis = N2. Shotty less than 1 cm bilateral inguinal lymph nodes are seen, which are not pathologically enlarged. Other:  None. IMPRESSION: Assuming pathologic diagnosis of rectal adenocarcinoma, local staging is T3d, N2, Mx. This tumor shows involvement of the anal sphincter. Electronically Signed   By: Marlaine Hind M.D.   On: 03/12/2021 08:39   PERIPHERAL VASCULAR CATHETERIZATION  Result Date: 02/27/2021 See surgical note for result.      ASSESSMENT & PLAN:  1. Rectal cancer ([place])   2. Iron deficiency anemia due to chronic blood  loss   3. Neoplasm related pain   4. Lung nodule   5. Peri-rectal abscess   Cancer Staging Rectal cancer [facility]) Staging form: Colon and Rectum, AJCC 8th Edition - Clinical stage from 02/08/2021: Stage IIIB (cT4a, cN1, cM0) - Signed by Earlie Server, MD on 03/06/2021   #locally advanced rectal cancer. His case is complicated with a perirectal abscess. Prior to onset of perirectal abscess, on his 02/05/2021 scan, he was noted to have right internal iliac lymph node 3 x 1 x 2.3 cm which is concerning for nodal disease.On Subsequent images it is difficult to distinguish whether lymphadenopathy was due to nodal disease versus acute inflammation. 03/07/2021 MRI pelvis cT3 N2. Due to the possible contained perforation on previous CT, possible cT4 disease.   S/p 1 cycle of FOLFOX. He tolerates well.  Labs are reviewed and discussed with patient. Finish current supply of Augmentin- last dose around 7/20 Patient will have follow up with Dr.Pabon for gluteal abscess s/p drain.  He will also see colorectal surgeon this week to establish care.    #Spiculated lung nodule, likely primary lung cancer. Discussed with IR, CT guided biopsy is difficult given the position and small size. Will monitor for now. His rectal cancer treatment takes priority.   #Iron deficiency anemia Status post IV Venofer treatments Hemoglobin improved to 9.4 Proceed with IV Venofer treatment today  #Leukocytosis due to recent.  Rectal abscess Normalized.  #Thrombocytosis, likely due to iron deficiency/ inflammation.  Anticipate to improve with improvement of iron stores.  #Rectal pain, continue fentanyl patch 53mcg Q72 hours. Norco PRN  Follow up in 1 week for lab md FOLFOX All questions were answered. The patient knows to call the clinic with any problems questions or concerns.  Return of visit:  1 week lab md IVF/venofer, 2 weeks lab md Folfox   Earlie Server, MD, PhD Hematology Oncology [facility] at [facility] Pager- [phone] 03/25/2021

## 2021-03-25 NOTE — Telephone Encounter (Signed)
PA approved.

## 2021-03-25 NOTE — Patient Instructions (Signed)
[address] [place], [place] [address]  Appointment with Dr. Annye English 03/26/21 at [place]. Please arrive at 0910.

## 2021-03-26 ENCOUNTER — Encounter: Payer: Self-pay | Admitting: Oncology

## 2021-03-27 ENCOUNTER — Ambulatory Visit (INDEPENDENT_AMBULATORY_CARE_PROVIDER_SITE_OTHER): Payer: BC Managed Care – PPO | Admitting: Surgery

## 2021-03-27 ENCOUNTER — Encounter: Payer: Self-pay | Admitting: Surgery

## 2021-03-27 ENCOUNTER — Other Ambulatory Visit: Payer: Self-pay

## 2021-03-27 VITALS — BP 128/89 | HR 106 | Temp 98.8°F | Ht 68.0 in | Wt 158.4 lb

## 2021-03-27 DIAGNOSIS — Z09 Encounter for follow-up examination after completed treatment for conditions other than malignant neoplasm: Secondary | ICD-10-CM

## 2021-03-27 NOTE — Progress Notes (Signed)
56 yo male w low rectal ca and perirectal abscess Doing much better No fevers or chills Started chemo already Seen by Dr. Chilton Si colorectal surgeon Plan to do keep chemo and radiation followed potentially by APR if clinically indicated  PE NAd, not toxic Rectum: no abscess, rectal cancer w spread into the perianal tissues, no perineal sepsis  A/P No acute surgical issues Continue current rx F/u oncology and colorectal sx

## 2021-03-27 NOTE — Patient Instructions (Signed)
  Please finish all Antibiotic. Please be sure to follow up with all your providers.

## 2021-03-29 ENCOUNTER — Encounter: Payer: Self-pay | Admitting: Oncology

## 2021-03-30 ENCOUNTER — Other Ambulatory Visit: Payer: Self-pay | Admitting: Oncology

## 2021-03-31 ENCOUNTER — Encounter: Payer: Self-pay | Admitting: Nurse Practitioner

## 2021-04-01 ENCOUNTER — Encounter: Payer: Self-pay | Admitting: Oncology

## 2021-04-01 ENCOUNTER — Inpatient Hospital Stay (HOSPITAL_BASED_OUTPATIENT_CLINIC_OR_DEPARTMENT_OTHER): Payer: BC Managed Care – PPO | Admitting: Oncology

## 2021-04-01 ENCOUNTER — Inpatient Hospital Stay: Payer: BC Managed Care – PPO

## 2021-04-01 VITALS — BP 136/86 | HR 100 | Temp 98.8°F | Resp 18

## 2021-04-01 DIAGNOSIS — R911 Solitary pulmonary nodule: Secondary | ICD-10-CM

## 2021-04-01 DIAGNOSIS — C20 Malignant neoplasm of rectum: Secondary | ICD-10-CM

## 2021-04-01 DIAGNOSIS — G893 Neoplasm related pain (acute) (chronic): Secondary | ICD-10-CM

## 2021-04-01 DIAGNOSIS — D500 Iron deficiency anemia secondary to blood loss (chronic): Secondary | ICD-10-CM

## 2021-04-01 DIAGNOSIS — K611 Rectal abscess: Secondary | ICD-10-CM

## 2021-04-01 DIAGNOSIS — Z95828 Presence of other vascular implants and grafts: Secondary | ICD-10-CM

## 2021-04-01 DIAGNOSIS — Z5111 Encounter for antineoplastic chemotherapy: Secondary | ICD-10-CM

## 2021-04-01 LAB — CBC WITH DIFFERENTIAL/PLATELET
Abs Immature Granulocytes: 0.02 10*3/uL (ref 0.00–0.07)
Basophils Absolute: 0 10*3/uL (ref 0.0–0.1)
Basophils Relative: 0 %
Eosinophils Absolute: 0.4 10*3/uL (ref 0.0–0.5)
Eosinophils Relative: 5 %
HCT: 32.4 % — ABNORMAL LOW (ref 39.0–52.0)
Hemoglobin: 10.4 g/dL — ABNORMAL LOW (ref 13.0–17.0)
Immature Granulocytes: 0 %
Lymphocytes Relative: 14 %
Lymphs Abs: 1.2 10*3/uL (ref 0.7–4.0)
MCH: 25.2 pg — ABNORMAL LOW (ref 26.0–34.0)
MCHC: 32.1 g/dL (ref 30.0–36.0)
MCV: 78.6 fL — ABNORMAL LOW (ref 80.0–100.0)
Monocytes Absolute: 1 10*3/uL (ref 0.1–1.0)
Monocytes Relative: 12 %
Neutro Abs: 6 10*3/uL (ref 1.7–7.7)
Neutrophils Relative %: 69 %
Platelets: 417 10*3/uL — ABNORMAL HIGH (ref 150–400)
RBC: 4.12 MIL/uL — ABNORMAL LOW (ref 4.22–5.81)
RDW: 27.5 % — ABNORMAL HIGH (ref 11.5–15.5)
Smear Review: ADEQUATE
WBC: 8.7 10*3/uL (ref 4.0–10.5)
nRBC: 0 % (ref 0.0–0.2)

## 2021-04-01 LAB — COMPREHENSIVE METABOLIC PANEL
ALT: 15 U/L (ref 0–44)
AST: 15 U/L (ref 15–41)
Albumin: 3.3 g/dL — ABNORMAL LOW (ref 3.5–5.0)
Alkaline Phosphatase: 83 U/L (ref 38–126)
Anion gap: 6 (ref 5–15)
BUN: 11 mg/dL (ref 6–20)
CO2: 26 mmol/L (ref 22–32)
Calcium: 8.7 mg/dL — ABNORMAL LOW (ref 8.9–10.3)
Chloride: 102 mmol/L (ref 98–111)
Creatinine, Ser: 0.91 mg/dL (ref 0.61–1.24)
GFR, Estimated: >60 mL/min (ref >60)
Glucose, Bld: 96 mg/dL (ref 70–99)
Potassium: 4 mmol/L (ref 3.5–5.1)
Sodium: 134 mmol/L — ABNORMAL LOW (ref 135–145)
Total Bilirubin: 0.4 mg/dL (ref 0.3–1.2)
Total Protein: 7.7 g/dL (ref 6.5–8.1)

## 2021-04-01 MED ORDER — HEPARIN SOD (PORK) LOCK FLUSH 100 UNIT/ML IV SOLN
500.0000 [IU] | Freq: Once | INTRAVENOUS | Status: DC
  Filled 2021-04-01: qty 5

## 2021-04-01 MED ORDER — FENTANYL 50 MCG/HR TD PT72: 1.0000 | MEDICATED_PATCH | TRANSDERMAL | 0 refills | Status: DC

## 2021-04-01 MED ORDER — PALONOSETRON HCL INJECTION 0.25 MG/5ML
0.2500 mg | Freq: Once | INTRAVENOUS | Status: AC
  Administered 2021-04-01: 0.25 mg via INTRAVENOUS
  Filled 2021-04-01: qty 5

## 2021-04-01 MED ORDER — OXALIPLATIN CHEMO INJECTION 100 MG/20ML
150.0000 mg | Freq: Once | INTRAVENOUS | Status: AC
  Administered 2021-04-01: 150 mg via INTRAVENOUS
  Filled 2021-04-01: qty 20

## 2021-04-01 MED ORDER — LEUCOVORIN CALCIUM INJECTION 350 MG
750.0000 mg | Freq: Once | INTRAVENOUS | Status: AC
  Administered 2021-04-01: 750 mg via INTRAVENOUS
  Filled 2021-04-01: qty 25

## 2021-04-01 MED ORDER — FLUOROURACIL CHEMO INJECTION 2.5 GM/50ML
400.0000 mg/m2 | Freq: Once | INTRAVENOUS | Status: AC
  Administered 2021-04-01: 750 mg via INTRAVENOUS
  Filled 2021-04-01: qty 15

## 2021-04-01 MED ORDER — SODIUM CHLORIDE 0.9 % IV SOLN
Freq: Once | INTRAVENOUS | Status: AC
  Filled 2021-04-01: qty 250

## 2021-04-01 MED ORDER — DEXAMETHASONE SODIUM PHOSPHATE 100 MG/10ML IJ SOLN
10.0000 mg | Freq: Once | INTRAMUSCULAR | Status: AC
  Administered 2021-04-01: 10 mg via INTRAVENOUS
  Filled 2021-04-01: qty 10

## 2021-04-01 MED ORDER — SODIUM CHLORIDE 0.9% FLUSH
10.0000 mL | Freq: Once | INTRAVENOUS | Status: AC
  Administered 2021-04-01: 10 mL via INTRAVENOUS
  Filled 2021-04-01: qty 10

## 2021-04-01 MED ORDER — DEXTROSE 5 % IV SOLN
Freq: Once | INTRAVENOUS | Status: AC
  Filled 2021-04-01: qty 250

## 2021-04-01 MED ORDER — SODIUM CHLORIDE 0.9 % IV SOLN
2400.0000 mg/m2 | INTRAVENOUS | Status: DC
  Administered 2021-04-01: 4350 mg via INTRAVENOUS
  Filled 2021-04-01: qty 87

## 2021-04-01 MED ORDER — SODIUM CHLORIDE 0.9 % IV SOLN: 200.0000 mg | Freq: Once | INTRAVENOUS | Status: DC

## 2021-04-01 MED ORDER — IRON SUCROSE 20 MG/ML IV SOLN
200.0000 mg | Freq: Once | INTRAVENOUS | Status: AC
  Administered 2021-04-01: 200 mg via INTRAVENOUS
  Filled 2021-04-01: qty 10

## 2021-04-01 NOTE — Patient Instructions (Signed)
[facility]  Discharge Instructions: Thank you for choosing [facility] to provide your oncology and hematology care.  If you have a lab appointment with the [facility], please go directly to the [facility] and check in at the registration area.  Wear comfortable clothing and clothing appropriate for easy access to any Portacath or PICC line.   We strive to give you quality time with your provider. You may need to reschedule your appointment if you arrive late (15 or more minutes).  Arriving late affects you and other patients whose appointments are after yours.  Also, if you miss three or more appointments without notifying the office, you may be dismissed from the clinic at the provider's discretion.      For prescription refill requests, have your pharmacy contact our office and allow 72 hours for refills to be completed.    Today you received the following chemotherapy and/or immunotherapy agents Oxaliplatin, Leucovorin, & 5FU      To help prevent nausea and vomiting after your treatment, we encourage you to take your nausea medication as directed.  BELOW ARE SYMPTOMS THAT SHOULD BE REPORTED IMMEDIATELY: *FEVER GREATER THAN 100.4 F (38 C) OR HIGHER *CHILLS OR SWEATING *NAUSEA AND VOMITING THAT IS NOT CONTROLLED WITH YOUR NAUSEA MEDICATION *UNUSUAL SHORTNESS OF BREATH *UNUSUAL BRUISING OR BLEEDING *URINARY PROBLEMS (pain or burning when urinating, or frequent urination) *BOWEL PROBLEMS (unusual diarrhea, constipation, pain near the anus) TENDERNESS IN MOUTH AND THROAT WITH OR WITHOUT PRESENCE OF ULCERS (sore throat, sores in mouth, or a toothache) UNUSUAL RASH, SWELLING OR PAIN  UNUSUAL VAGINAL DISCHARGE OR ITCHING   Items with * indicate a potential emergency and should be followed up as soon as possible or go to the Emergency Department if any problems should occur.  Please show the CHEMOTHERAPY ALERT CARD or IMMUNOTHERAPY  ALERT CARD at check-in to the Emergency Department and triage nurse.  Should you have questions after your visit or need to cancel or reschedule your appointment, please contact [facility]  [phone] and follow the prompts.  Office hours are 8:00 a.m. to 4:30 p.m. Monday - Friday. Please note that voicemails left after 4:00 p.m. may not be returned until the following business day.  We are closed weekends and major holidays. You have access to a nurse at all times for urgent questions. Please call the main number to the clinic [phone] and follow the prompts.  For any non-urgent questions, you may also contact your provider using MyChart. We now offer e-Visits for anyone 53 and older to request care online for non-urgent symptoms. For details visit [email or web address].   Also download the MyChart app! Go to the app store, search "MyChart", open the app, select [facility], and log in with your MyChart username and password.  Due to Covid, a mask is required upon entering the hospital/clinic. If you do not have a mask, one will be given to you upon arrival. For doctor visits, patients may have 1 support person aged 5 or older with them. For treatment visits, patients cannot have anyone with them due to current Covid guidelines and our immunocompromised population.

## 2021-04-01 NOTE — Progress Notes (Signed)
Hematology/Oncology follow up note [facility] [phone] [phone]   Patient Care Team: Pcp, No as PCP - General [name], RN as Oncology Nurse Navigator  REFERRING PROVIDER: Lorella Nimrod, MD  CHIEF COMPLAINTS/REASON FOR VISIT:  Follow up for rectal cancer treatment  HISTORY OF PRESENTING ILLNESS:   Arsen Lanes. is a  55 y.o.  male with PMH listed below was seen in consultation at the request of  Lorella Nimrod, MD  for evaluation of rectal cancer  02/05/2021-02/06/2021 patient was hospitalized due to generalized weakness, intermittent lightheadedness, weight loss and worsening constipation.  02/05/2021 CT abdomen showed concerning of severe rectal wall thickening and right internal iliac lymph node concerning for metastatic disease.  Patient was seen by gastroenterology and had colonoscopy which showed a circumferential fungating mass in the rectum.  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  02/14/2021-02/16/2021 hospitalized due to rectal bleeding and pain.   02/14/2021 CT showed perirectal fluid collection/gas concerning for infection with significant leukocytosis, anemia with hemoglobin of 6.8.  Patient received PRBC transfusion, IV antibiotics.  He underwent an IR guided placement of JP drain into the rectal abscess.  Discharged home with oral Augmentin. 02/20/2021 presented to ER with new skin opening and draining from left gluteus.  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  JP drain has been removed.  Patient was continued on Augmentin.  02/13/2021, PET scan showed locally advanced rectal cancer with billowing of the mesorectum now with low attenuation material that was not present on previous examination with extensive stranding and inflammation.   Bulky RIGHT pelvic sidewall/hypogastric lymph node outside of the mesorectum with stippled calcification measuring 19 mm-no increased metabolic  activity. Small LEFT hypogastric lymph node just peripheral to the internal external bifurcation-SUV 3.2 High RIGHT internal just below or at the internal/common iliac transition, lymph node -SUV 4.8 LEFT high hypogastric lymph node  8 mm-SUV 3. Scattered lymph nodes throughout the retroperitoneum with low FDG uptake Bilateral inguinal lymph nodes largest on the RIGHT (image 248/3) 11 mm with a maximum SUV of 2.8  spiculated nodule in the LEFT upper lobe- 9 x 8 mm  02/14/2021, CT abdomen pelvis without contrast Showed perforated rectal mass with contained perforation with fluid and gas extending above and below the pelvic floor,potentially involving the sphincter complex and extending into LEFT ischial rectal fossa  02/20/2021, CT pelvis with contrast showed Large perirectal/perianal abscess has markedly decreased in size since placement of the percutaneous drain. There are residual gas-filled collections in the soft tissues and suspect there is a fistula or sinus tract between the rectal mass and the subcutaneous tissues. Soft tissue gas along the medial left buttock and concern for a cutaneous ulceration in this area. Large rectal mass with evidence for a large necrotic right pelvic lymph node.   02/27/2021 medi port placed by Dr.Dew 03/13/2021 reports right butt cheek and also left perianal area fullness.he was seen by Dr.Pabon urgently  and had right buttock abscess drained. Left perianal fullness was felt to be due to cancer.   03/13/2021 started [place] Lecy Jr. is a 55 y.o. male who has above history reviewed by me today presents for follow up visit for management of rectal cancer. Problems and complaints are listed below:  .  perianal pain has improved a lot after starting Fentanyl patch. He does not need to use short aciting narcotics.  No difficulty passing bowel movements. No blood in the stool.  No  fever or chills.  Appetite has improved.    Review of  Systems  Constitutional:  Positive for fatigue. Negative for appetite change, chills, fever and unexpected weight change.  HENT:   Negative for hearing loss and voice change.   Eyes:  Negative for eye problems and icterus.  Respiratory:  Negative for chest tightness, cough and shortness of breath.   Cardiovascular:  Negative for chest pain and leg swelling.  Gastrointestinal:  Positive for blood in stool and rectal pain. Negative for abdominal distention, abdominal pain and constipation.  Endocrine: Negative for hot flashes.  Genitourinary:  Negative for difficulty urinating, dysuria and frequency.   Musculoskeletal:  Negative for arthralgias.  Skin:  Negative for itching and rash.  Neurological:  Negative for light-headedness and numbness.  Hematological:  Negative for adenopathy. Does not bruise/bleed easily.  Psychiatric/Behavioral:  Negative for confusion.    MEDICAL HISTORY:  Past Medical History:  Diagnosis Date   Headache    Left shoulder pain    Rectal cancer ([place])     SURGICAL HISTORY: Past Surgical History:  Procedure Laterality Date   COLONOSCOPY WITH PROPOFOL N/A 02/06/2021   Procedure: COLONOSCOPY WITH PROPOFOL;  Surgeon: Lin Landsman, MD;  Location: [facility];  Service: Gastroenterology;  Laterality: N/A;   PORTA CATH INSERTION N/A 02/27/2021   Procedure: PORTA CATH INSERTION;  Surgeon: Algernon Huxley, MD;  Location: [facility] CV LAB;  Service: Cardiovascular;  Laterality: N/A;   ROTATOR CUFF REPAIR Left     SOCIAL HISTORY: Social History   Socioeconomic History   Marital status: Married    Spouse name: Not on file   Number of children: Not on file   Years of education: Not on file   Highest education level: Not on file  Occupational History   Not on file  Tobacco Use   Smoking status: Former    Packs/day: 1.00    Years: 10.00    Pack years: 10.00    Types: Cigars, Cigarettes    Quit date: 02/04/2021    Years since quitting: 0.1   Smokeless  tobacco: Never  Substance and Sexual Activity   Alcohol use: Not Currently   Drug use: No   Sexual activity: Not on file  Other Topics Concern   Not on file  Social History Narrative   Not on file   Social Determinants of Health   Financial Resource Strain: Not on file  Food Insecurity: Not on file  Transportation Needs: Not on file  Physical Activity: Not on file  Stress: Not on file  Social Connections: Not on file  Intimate Partner Violence: Not on file    FAMILY HISTORY: Family History  Problem Relation Age of Onset   Cancer Sister    Diabetes Mother    Cancer Maternal Grandmother    Cancer Paternal Grandmother     ALLERGIES:  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  MEDICATIONS:  Current Outpatient Medications  Medication Sig Dispense Refill   feeding supplement (ENSURE ENLIVE / ENSURE PLUS) LIQD Take 237 mLs by mouth daily. 237 mL 12   HYDROcodone-acetaminophen (NORCO) 5-325 MG tablet Take 1 tablet by mouth every 6 (six) hours as needed for moderate pain. 90 tablet 0   ibuprofen (ADVIL) 400 MG tablet Take 1 tablet (400 mg total) by mouth every 6 (six) hours as needed. 30 tablet 0   lidocaine-prilocaine (EMLA) cream Apply to affected area once 30 g 3   nicotine (NICODERM CQ - DOSED IN MG/24 HOURS) 21 mg/24hr patch  Place 1 patch (21 mg total) onto the skin daily. 28 patch 0   ondansetron (ZOFRAN) 8 MG tablet Take 1 tablet (8 mg total) by mouth 2 (two) times daily as needed for refractory nausea / vomiting. Start on day 3 after chemotherapy. 30 tablet 1   prochlorperazine (COMPAZINE) 10 MG tablet Take 1 tablet (10 mg total) by mouth every 6 (six) hours as needed (Nausea or vomiting). 30 tablet 1   senna-docusate (SENOKOT-S) 8.6-50 MG tablet Take 2 tablets by mouth daily. 120 tablet 0   amoxicillin (AMOXIL) 875 MG tablet Take by mouth.     fentaNYL (DURAGESIC) 50 MCG/HR Place 1 patch onto the skin every 3 (three) days. 10 patch 0   ferrous sulfate 325 (65 FE) MG tablet Take 1  tablet (325 mg total) by mouth 2 (two) times daily with a meal. (Patient not taking: Reported on 04/01/2021) 60 tablet 3   No current facility-administered medications for this visit.   Facility-Administered Medications Ordered in Other Visits  Medication Dose Route Frequency Provider Last Rate Last Admin   fluorouracil (ADRUCIL) 4,350 mg in sodium chloride 0.9 % 63 mL chemo infusion  2,400 mg/m2 (Treatment Plan Recorded) Intravenous 1 day or 1 dose Earlie Server, MD       fluorouracil (ADRUCIL) chemo injection 750 mg  400 mg/m2 (Treatment Plan Recorded) Intravenous Once Earlie Server, MD       heparin lock flush 100 unit/mL  500 Units Intravenous Once Earlie Server, MD       leucovorin 750 mg in dextrose 5 % 250 mL infusion  750 mg Intravenous Once Earlie Server, MD 144 mL/hr at 04/01/21 1106 750 mg at 04/01/21 1106   oxaliplatin (ELOXATIN) 150 mg in dextrose 5 % 500 mL chemo infusion  150 mg Intravenous Once Earlie Server, MD 265 mL/hr at 04/01/21 1104 150 mg at 04/01/21 1104     PHYSICAL EXAMINATION: ECOG PERFORMANCE STATUS: 1 - Symptomatic but completely ambulatory Vitals:   04/01/21 0920  BP: 136/86  Pulse: 100  Resp: 18  Temp: 98.8 F (37.1 C)  SpO2: 100%    There were no vitals filed for this visit.   Physical Exam Constitutional:      Comments: She ambulates independently.  HENT:     Head: Normocephalic and atraumatic.  Eyes:     General: No scleral icterus. Cardiovascular:     Rate and Rhythm: Normal rate and regular rhythm.     Heart sounds: Normal heart sounds.  Pulmonary:     Effort: Pulmonary effort is normal. No respiratory distress.     Breath sounds: No wheezing.  Abdominal:     General: Bowel sounds are normal. There is no distension.     Palpations: Abdomen is soft.  Musculoskeletal:        General: No deformity. Normal range of motion.     Cervical back: Normal range of motion and neck supple.  Skin:    General: Skin is warm and dry.     Findings: No erythema or rash.   Neurological:     Mental Status: He is alert and oriented to person, place, and time. Mental status is at baseline.     Cranial Nerves: No cranial nerve deficit.     Coordination: Coordination normal.  Psychiatric:        Mood and Affect: Mood normal.    LABORATORY DATA:  I have reviewed the data as listed Lab Results  Component Value Date   WBC 8.7 04/01/2021  HGB 10.4 (L) 04/01/2021   HCT 32.4 (L) 04/01/2021   MCV 78.6 (L) 04/01/2021   PLT 417 (H) 04/01/2021   Recent Labs    03/18/21 0804 03/25/21 1029 04/01/21 0836  NA 133* 133* 134*  K 4.0 3.6 4.0  CL 102 101 102  CO2 '26 25 26  '$ GLUCOSE 125* 104* 96  BUN '14 11 11  '$ CREATININE 0.84 0.93 0.91  CALCIUM 8.7* 8.6* 8.7*  GFRNONAA >60 >60 >60  PROT 8.0 8.0 7.7  ALBUMIN 3.2* 3.6 3.3*  AST '17 16 15  '$ ALT '16 13 15  '$ ALKPHOS 72 71 83  BILITOT 0.2* 0.4 0.4    Iron/TIBC/Ferritin/ %Sat    Component Value Date/Time   IRON 28 (L) 03/25/2021 1029   TIBC 356 03/25/2021 1029   FERRITIN 118 03/25/2021 1029   IRONPCTSAT 8 (L) 03/25/2021 1029      RADIOGRAPHIC STUDIES: I have personally reviewed the radiological images as listed and agreed with the findings in the report. MR Pelvis Wo Contrast  Result Date: 03/12/2021 CLINICAL DATA:  Newly diagnosed rectal carcinoma. EXAM: MRI PELVIS WITHOUT CONTRAST TECHNIQUE: Multiplanar multisequence MR imaging of the pelvis was performed. No intravenous contrast was administered. COMPARISON:  None. FINDINGS: TUMOR LOCATION Tumor distance from Anal Verge/Skin Surface: 1.4 cm Tumor distance from Internal Anal Sphincter: 0 cm; the tumor directly involves the upper and mid anal sphincter TUMOR DESCRIPTION Circumferential Extent: 100% Tumor Length: 11.2 cm T - CATEGORY Extension through Muscularis Propria: Yes, measuring up to 23 mm = T3d Shortest Distance from Mass to Mesorectal Fascia: 0 mm Extramural Vascular Invasion/Tumor Thrombus: No Invasion of Anterior Peritoneal Reflection: No Involvement of  Adjacent Organs or Pelvic Sidewall: No Levator Ani Involvement: Yesa N - CATEGORY Mesorectal Lymph Nodes >=31m: 4 or more=N2 Extra-mesorectal Lymphadenopathy: Yes, bilateral internal iliac lymphadenopathy is seen, with largest on the right measuring 2.3 cm in short axis = N2. Shotty less than 1 cm bilateral inguinal lymph nodes are seen, which are not pathologically enlarged. Other:  None. IMPRESSION: Assuming pathologic diagnosis of rectal adenocarcinoma, local staging is T3d, N2, Mx. This tumor shows involvement of the anal sphincter. Electronically Signed   By: JMarlaine HindM.D.   On: 03/12/2021 08:39       ASSESSMENT & PLAN:  1. Rectal cancer ([place]   2. Iron deficiency anemia due to chronic blood loss   3. Neoplasm related pain   4. Lung nodule   5. Peri-rectal abscess   6. Encounter for antineoplastic chemotherapy   Cancer Staging Rectal cancer ([facility] Staging form: Colon and Rectum, AJCC 8th Edition - Clinical stage from 02/08/2021: Stage IIIB (cT4a, cN1, cM0) - Signed by YEarlie Server MD on 03/06/2021   #locally advanced rectal cancer. His case is complicated with a perirectal abscess. Prior to onset of perirectal abscess, on his 02/05/2021 scan, he was noted to have right internal iliac lymph node 3 x 1 x 2.3 cm which is concerning for nodal disease.On Subsequent images it is difficult to distinguish whether lymphadenopathy was due to nodal disease versus acute inflammation. 03/07/2021 MRI pelvis cT3 N2. Due to the possible contained perforation on previous CT, possible cT4 disease.   S/p 1 cycle of FOLFOX. He tolerates well.  Finish current supply of Augmentin- last dose around 7/20 Labs are reviewed and discussed with patient. He has established care with Surgery Dr.White and was recommend to establish care with [name]  #Spiculated lung nodule, likely primary lung cancer. Discussed with IR, CT guided biopsy is  difficult given the position and small size. Will monitor for now. His  rectal cancer treatment takes priority.   #Iron deficiency anemia Status post IV Venofer treatments Hemoglobin improved to 10.4  Proceed with IV Venofer treatment today  #Leukocytosis due to recent.  Rectal abscess Normalized.  #Thrombocytosis, likely due to iron deficiency/ inflammation.  Anticipate to improve with improvement of iron stores. Improving   #Rectal pain, continue fentanyl patch 31mg Q72 hours. During last visit, he feels the fentanyl patch significantly improves his pain and he was not taking any Norco PRN. This week he requests Norco to be refilled. Last refill was 90 tablets on 03/22/2021. Patient then states that he is still taking Norco every 6 hours, and also spilt " quite a few tablets " when he opened his bottle recently.  Discussed with patient that if pain is better, he only needs to take norco every 6 hours as needed, not scheduled, any we will re-evaluate his usage of norco at next visit. He may also use Tylenol alternate with Ibuprofen for mild pain. He agrees.    Follow up in 2 weeks for lab md FOLFOX All questions were answered. The patient knows to call the clinic with any problems questions or concerns.  Return of visit:   2 weeks lab md Folfox   ZEarlie Server MD, PhD Hematology Oncology [facility]moreat AThree Gables Surgery CenterPager- [phone]/25/2022

## 2021-04-02 LAB — CEA: CEA: 3.4 ng/mL (ref 0.0–4.7)

## 2021-04-03 ENCOUNTER — Other Ambulatory Visit: Payer: Self-pay

## 2021-04-03 ENCOUNTER — Inpatient Hospital Stay: Payer: BC Managed Care – PPO

## 2021-04-03 DIAGNOSIS — C20 Malignant neoplasm of rectum: Secondary | ICD-10-CM | POA: Diagnosis not present

## 2021-04-03 MED ORDER — HEPARIN SOD (PORK) LOCK FLUSH 100 UNIT/ML IV SOLN
500.0000 [IU] | Freq: Once | INTRAVENOUS | Status: AC | PRN
  Administered 2021-04-03: 500 [IU]
  Filled 2021-04-03: qty 5

## 2021-04-03 MED ORDER — SODIUM CHLORIDE 0.9% FLUSH
10.0000 mL | INTRAVENOUS | Status: DC | PRN
  Administered 2021-04-03: 10 mL
  Filled 2021-04-03: qty 10

## 2021-04-03 MED ORDER — HEPARIN SOD (PORK) LOCK FLUSH 100 UNIT/ML IV SOLN
INTRAVENOUS | Status: AC
  Filled 2021-04-03: qty 5

## 2021-04-03 NOTE — Progress Notes (Signed)
The proposed treatment discussed in conference is for discussion purpose only and is not a binding recommendation.  The patients have not been physically examined, or presented with their treatment options.  Therefore, final treatment plans cannot be decided.  

## 2021-04-04 ENCOUNTER — Encounter: Payer: Self-pay | Admitting: Oncology

## 2021-04-05 ENCOUNTER — Encounter: Payer: Self-pay | Admitting: Oncology

## 2021-04-08 ENCOUNTER — Encounter: Payer: Self-pay | Admitting: Oncology

## 2021-04-11 ENCOUNTER — Encounter: Payer: Self-pay | Admitting: Oncology

## 2021-04-12 ENCOUNTER — Encounter: Payer: Self-pay | Admitting: Oncology

## 2021-04-15 ENCOUNTER — Encounter: Payer: Self-pay | Admitting: Oncology

## 2021-04-15 ENCOUNTER — Inpatient Hospital Stay: Payer: Self-pay

## 2021-04-15 ENCOUNTER — Inpatient Hospital Stay (HOSPITAL_BASED_OUTPATIENT_CLINIC_OR_DEPARTMENT_OTHER): Payer: Self-pay | Admitting: Oncology

## 2021-04-15 ENCOUNTER — Inpatient Hospital Stay: Payer: Self-pay | Attending: Oncology

## 2021-04-15 ENCOUNTER — Other Ambulatory Visit: Payer: Self-pay

## 2021-04-15 VITALS — BP 128/84 | HR 102 | Temp 96.7°F | Resp 18 | Wt 158.0 lb

## 2021-04-15 VITALS — HR 95

## 2021-04-15 DIAGNOSIS — Z79899 Other long term (current) drug therapy: Secondary | ICD-10-CM | POA: Insufficient documentation

## 2021-04-15 DIAGNOSIS — G893 Neoplasm related pain (acute) (chronic): Secondary | ICD-10-CM | POA: Insufficient documentation

## 2021-04-15 DIAGNOSIS — Z5111 Encounter for antineoplastic chemotherapy: Secondary | ICD-10-CM | POA: Insufficient documentation

## 2021-04-15 DIAGNOSIS — C20 Malignant neoplasm of rectum: Secondary | ICD-10-CM

## 2021-04-15 DIAGNOSIS — R911 Solitary pulmonary nodule: Secondary | ICD-10-CM

## 2021-04-15 DIAGNOSIS — Z87891 Personal history of nicotine dependence: Secondary | ICD-10-CM | POA: Insufficient documentation

## 2021-04-15 DIAGNOSIS — D500 Iron deficiency anemia secondary to blood loss (chronic): Secondary | ICD-10-CM | POA: Insufficient documentation

## 2021-04-15 LAB — COMPREHENSIVE METABOLIC PANEL
ALT: 14 U/L (ref 0–44)
AST: 16 U/L (ref 15–41)
Albumin: 3.2 g/dL — ABNORMAL LOW (ref 3.5–5.0)
Alkaline Phosphatase: 81 U/L (ref 38–126)
Anion gap: 7 (ref 5–15)
BUN: 9 mg/dL (ref 6–20)
CO2: 27 mmol/L (ref 22–32)
Calcium: 8.5 mg/dL — ABNORMAL LOW (ref 8.9–10.3)
Chloride: 100 mmol/L (ref 98–111)
Creatinine, Ser: 0.83 mg/dL (ref 0.61–1.24)
GFR, Estimated: >60 mL/min (ref >60)
Glucose, Bld: 135 mg/dL — ABNORMAL HIGH (ref 70–99)
Potassium: 3.5 mmol/L (ref 3.5–5.1)
Sodium: 134 mmol/L — ABNORMAL LOW (ref 135–145)
Total Bilirubin: 0.5 mg/dL (ref 0.3–1.2)
Total Protein: 7.7 g/dL (ref 6.5–8.1)

## 2021-04-15 LAB — CBC WITH DIFFERENTIAL/PLATELET
Abs Immature Granulocytes: 0.02 10*3/uL (ref 0.00–0.07)
Basophils Absolute: 0 10*3/uL (ref 0.0–0.1)
Basophils Relative: 0 %
Eosinophils Absolute: 0.3 10*3/uL (ref 0.0–0.5)
Eosinophils Relative: 3 %
HCT: 31.8 % — ABNORMAL LOW (ref 39.0–52.0)
Hemoglobin: 10.3 g/dL — ABNORMAL LOW (ref 13.0–17.0)
Immature Granulocytes: 0 %
Lymphocytes Relative: 19 %
Lymphs Abs: 2 10*3/uL (ref 0.7–4.0)
MCH: 25.9 pg — ABNORMAL LOW (ref 26.0–34.0)
MCHC: 32.4 g/dL (ref 30.0–36.0)
MCV: 79.9 fL — ABNORMAL LOW (ref 80.0–100.0)
Monocytes Absolute: 1.5 10*3/uL — ABNORMAL HIGH (ref 0.1–1.0)
Monocytes Relative: 14 %
Neutro Abs: 6.7 10*3/uL (ref 1.7–7.7)
Neutrophils Relative %: 64 %
Platelets: 280 10*3/uL (ref 150–400)
RBC: 3.98 MIL/uL — ABNORMAL LOW (ref 4.22–5.81)
RDW: 24.4 % — ABNORMAL HIGH (ref 11.5–15.5)
WBC: 10.5 10*3/uL (ref 4.0–10.5)
nRBC: 0 % (ref 0.0–0.2)

## 2021-04-15 MED ORDER — FLUOROURACIL CHEMO INJECTION 2.5 GM/50ML
400.0000 mg/m2 | Freq: Once | INTRAVENOUS | Status: AC
  Administered 2021-04-15: 750 mg via INTRAVENOUS
  Filled 2021-04-15: qty 15

## 2021-04-15 MED ORDER — DEXTROSE 5 % IV SOLN
Freq: Once | INTRAVENOUS | Status: AC
  Filled 2021-04-15: qty 250

## 2021-04-15 MED ORDER — FLUOROURACIL CHEMO INJECTION 5 GM/100ML
2400.0000 mg/m2 | INTRAVENOUS | Status: DC
  Administered 2021-04-15: 4350 mg via INTRAVENOUS
  Filled 2021-04-15: qty 87

## 2021-04-15 MED ORDER — OXALIPLATIN CHEMO INJECTION 100 MG/20ML
82.0000 mg/m2 | Freq: Once | INTRAVENOUS | Status: AC
  Administered 2021-04-15: 150 mg via INTRAVENOUS
  Filled 2021-04-15: qty 20

## 2021-04-15 MED ORDER — SODIUM CHLORIDE 0.9 % IV SOLN
10.0000 mg | Freq: Once | INTRAVENOUS | Status: AC
  Administered 2021-04-15: 10 mg via INTRAVENOUS
  Filled 2021-04-15: qty 10

## 2021-04-15 MED ORDER — LEUCOVORIN CALCIUM INJECTION 350 MG
412.0000 mg/m2 | Freq: Once | INTRAVENOUS | Status: AC
  Administered 2021-04-15: 750 mg via INTRAVENOUS
  Filled 2021-04-15: qty 37.5

## 2021-04-15 MED ORDER — LIDOCAINE-PRILOCAINE 2.5-2.5 % EX CREA: TOPICAL_CREAM | CUTANEOUS | 3 refills | Status: DC

## 2021-04-15 MED ORDER — PALONOSETRON HCL INJECTION 0.25 MG/5ML
0.2500 mg | Freq: Once | INTRAVENOUS | Status: AC
  Administered 2021-04-15: 0.25 mg via INTRAVENOUS
  Filled 2021-04-15: qty 5

## 2021-04-15 NOTE — Progress Notes (Signed)
Patient here for oncology follow-up appointment, expresses no complaints or concerns at this time.    

## 2021-04-15 NOTE — Progress Notes (Signed)
Hematology/Oncology follow up note [facility] Telephone:([phone] [phone]   [name] Care Team: Pcp, No as PCP - General [name], RN as Oncology Nurse Navigator  REFERRING PROVIDER: No ref. provider found  CHIEF COMPLAINTS/REASON FOR VISIT:  Follow up for rectal cancer treatment  HISTORY OF PRESENTING ILLNESS:   Barry [name]. is a  55 y.o.  [name] with PMH listed below was seen in consultation at the request of  No ref. provider found  for evaluation of rectal cancer  5/Barry/2022-02/06/2021 [name] was hospitalized due to generalized weakness, intermittent lightheadedness, weight loss and worsening constipation.  5/Barry/2022 CT abdomen showed concerning of severe rectal wall thickening and right internal iliac lymph node concerning for metastatic disease.  [name] was seen by gastroenterology and had colonoscopy which showed a circumferential fungating mass in the rectum.  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  02/14/2021-02/16/2021 hospitalized due to rectal bleeding and pain.   02/14/2021 CT showed perirectal fluid collection/gas concerning for infection with significant leukocytosis, anemia with hemoglobin of 6.8.  [name] received PRBC transfusion, IV antibiotics.  He underwent an IR guided placement of JP drain into the rectal abscess.  Discharged home with oral Augmentin. 02/20/2021 presented to ER with new skin opening and draining from left gluteus.  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  JP drain has been removed.  [name] was continued on Augmentin.  02/13/2021, PET scan showed locally advanced rectal cancer with billowing of the mesorectum now with low attenuation material that was not present on previous examination with extensive stranding and inflammation.   Bulky RIGHT pelvic sidewall/hypogastric lymph node outside of the mesorectum with stippled calcification measuring 19 mm-no increased metabolic  activity. Small LEFT hypogastric lymph node just peripheral to the internal external bifurcation-SUV 3.2 High RIGHT internal just below or at the internal/common iliac transition, lymph node -SUV 4.8 LEFT high hypogastric lymph node  8 mm-SUV 3. Scattered lymph nodes throughout the retroperitoneum with low FDG uptake Bilateral inguinal lymph nodes largest on the RIGHT (image 248/3) 11 mm with a maximum SUV of 2.8  spiculated nodule in the LEFT upper lobe- 9 x 8 mm  02/14/2021, CT abdomen pelvis without contrast Showed perforated rectal mass with contained perforation with fluid and gas extending above and below the pelvic floor,potentially involving the sphincter complex and extending into LEFT ischial rectal fossa  02/20/2021, CT pelvis with contrast showed Large perirectal/perianal abscess has markedly decreased in size since placement of the percutaneous drain. There are residual gas-filled collections in the soft tissues and suspect there is a fistula or sinus tract between the rectal mass and the subcutaneous tissues. Soft tissue gas along the medial left buttock and concern for a cutaneous ulceration in this area. Large rectal mass with evidence for a large necrotic right pelvic lymph node.   02/27/2021 medi port placed by Dr.Dew Barry reports right butt cheek and also left perianal area fullness.he was seen by Dr.Pabon urgently  and had right buttock abscess drained. Left perianal fullness was felt to be due to cancer.   Barry started Kingsbury Koogler Jr. is a 55 y.o. [name] who has above history reviewed by me today presents for follow up visit for management of rectal cancer. Problems and complaints are listed below:  .  # He was seen by [facility] Dr.Mantyh who agrees with TNT followed by surgery. # perianal pain has improved a lot after starting Fentanyl patch. He does not need to  use short aciting narcotics.  No difficulty passing bowel movements. No blood in  the stool.  No fever or chills. Appetite is good.  .    Review of Systems  Constitutional:  Negative for appetite change, chills, fatigue, fever and unexpected weight change.  HENT:   Negative for hearing loss and voice change.   Eyes:  Negative for eye problems and icterus.  Respiratory:  Negative for chest tightness, cough and shortness of breath.   Cardiovascular:  Negative for chest pain and leg swelling.  Gastrointestinal:  Positive for rectal pain. Negative for abdominal distention, abdominal pain, blood in stool and constipation.  Endocrine: Negative for hot flashes.  Genitourinary:  Negative for difficulty urinating, dysuria and frequency.   Musculoskeletal:  Negative for arthralgias.  Skin:  Negative for itching and rash.  Neurological:  Negative for light-headedness and numbness.  Hematological:  Negative for adenopathy. Does not bruise/bleed easily.  Psychiatric/Behavioral:  Negative for confusion.    MEDICAL HISTORY:  Past Medical History:  Diagnosis Date   Headache    Left shoulder pain    Rectal cancer ([place])     SURGICAL HISTORY: Past Surgical History:  Procedure Laterality Date   COLONOSCOPY WITH PROPOFOL N/A 02/06/2021   Procedure: COLONOSCOPY WITH PROPOFOL;  Surgeon: Lin Landsman, MD;  Location: [facility] ENDOSCOPY;  Service: Gastroenterology;  Laterality: N/A;   PORTA CATH INSERTION N/A 02/27/2021   Procedure: PORTA CATH INSERTION;  Surgeon: Algernon Huxley, MD;  Location: [facility];  Service: Cardiovascular;  Laterality: N/A;   ROTATOR CUFF REPAIR Left     SOCIAL HISTORY: Social History   Socioeconomic History   Marital status: Married    Spouse name: Not on file   Number of children: Not on file   Years of education: Not on file   Highest education level: Not on file  Occupational History   Not on file  Tobacco Use   Smoking status: Former    Packs/day: 1.00    Years: 10.00    Pack years: 10.00    Types: Cigars, Cigarettes    Quit  date: 02/04/2021    Years since quitting: 0.1   Smokeless tobacco: Never  Substance and Sexual Activity   Alcohol use: Not Currently   Drug use: No   Sexual activity: Not on file  Other Topics Concern   Not on file  Social History Narrative   Not on file   Social Determinants of Health   Financial Resource Strain: Not on file  Food Insecurity: Not on file  Transportation Needs: Not on file  Physical Activity: Not on file  Stress: Not on file  Social Connections: Not on file  Intimate Partner Violence: Not on file    FAMILY HISTORY: Family History  Problem Relation Age of Onset   Cancer Sister    Diabetes Mother    Cancer Maternal Grandmother    Cancer Paternal Grandmother     ALLERGIES:  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  MEDICATIONS:  Current Outpatient Medications  Medication Sig Dispense Refill   feeding supplement (ENSURE ENLIVE / ENSURE PLUS) LIQD Take 237 mLs by mouth daily. 237 mL 12   fentaNYL (DURAGESIC) 50 MCG/HR Place 1 patch onto the skin every 3 (three) days. 10 patch 0   ferrous sulfate 325 (65 FE) MG tablet Take 1 tablet (325 mg total) by mouth 2 (two) times daily with a meal. 60 tablet 3   ibuprofen (ADVIL) 400 MG tablet Take 1 tablet (400 mg total)  by mouth every 6 (six) hours as needed. 30 tablet 0   lidocaine-prilocaine (EMLA) cream Apply to affected area once 30 g 3   nicotine (NICODERM CQ - DOSED IN MG/24 HOURS) 21 mg/24hr patch Place 1 patch (21 mg total) onto the skin daily. 28 patch 0   ondansetron (ZOFRAN) 8 MG tablet Take 1 tablet (8 mg total) by mouth 2 (two) times daily as needed for refractory nausea / vomiting. Start on day 3 after chemotherapy. 30 tablet 1   prochlorperazine (COMPAZINE) 10 MG tablet Take 1 tablet (10 mg total) by mouth every 6 (six) hours as needed (Nausea or vomiting). 30 tablet 1   senna-docusate (SENOKOT-S) 8.6-50 MG tablet Take 2 tablets by mouth daily. 120 tablet 0   amoxicillin (AMOXIL) 875 MG tablet Take by mouth.  ([name] not taking: Reported on 04/15/2021)     HYDROcodone-acetaminophen (NORCO) 5-325 MG tablet Take 1 tablet by mouth every 6 (six) hours as needed for moderate pain. ([name] not taking: Reported on 04/15/2021) 90 tablet 0   No current facility-administered medications for this visit.     PHYSICAL EXAMINATION: ECOG PERFORMANCE STATUS: 1 - Symptomatic but completely ambulatory There were no vitals filed for this visit.   There were no vitals filed for this visit.   Physical Exam Constitutional:      Comments: She ambulates independently.  HENT:     Head: Normocephalic and atraumatic.  Eyes:     General: No scleral icterus. Cardiovascular:     Rate and Rhythm: Normal rate and regular rhythm.     Heart sounds: Normal heart sounds.  Pulmonary:     Effort: Pulmonary effort is normal. No respiratory distress.     Breath sounds: No wheezing.  Abdominal:     General: Bowel sounds are normal. There is no distension.     Palpations: Abdomen is soft.  Musculoskeletal:        General: No deformity. Normal range of motion.     Cervical back: Normal range of motion and neck supple.  Skin:    General: Skin is warm and dry.     Findings: No erythema or rash.  Neurological:     Mental Status: He is alert and oriented to person, place, and time. Mental status is at baseline.     Cranial Nerves: No cranial nerve deficit.     Coordination: Coordination normal.  Psychiatric:        Mood and Affect: Mood normal.    LABORATORY DATA:  I have reviewed the data as listed Lab Results  Component Value Date   WBC 8.7 04/01/2021   HGB 10.4 (L) 04/01/2021   HCT 32.4 (L) 04/01/2021   MCV 78.6 (L) 04/01/2021   PLT 417 (H) 04/01/2021   Recent Labs    03/18/21 0804 03/25/21 1029 04/01/21 0836  NA 133* 133* 134*  K 4.0 3.6 4.0  CL 102 101 102  CO2 '26 25 26  '$ GLUCOSE 125* 104* 96  BUN '14 11 11  '$ CREATININE 0.84 0.93 0.91  CALCIUM 8.7* 8.6* 8.7*  GFRNONAA >60 >60 >60  PROT 8.0 8.0 7.7   ALBUMIN 3.2* 3.6 3.3*  AST '17 16 15  '$ ALT '16 13 15  '$ ALKPHOS 72 71 83  BILITOT 0.2* 0.4 0.4    Iron/TIBC/Ferritin/ %Sat    Component Value Date/Time   IRON 28 (L) 03/25/2021 1029   TIBC 356 03/25/2021 1029   FERRITIN 118 03/25/2021 1029   IRONPCTSAT 8 (L) 03/25/2021 1029  RADIOGRAPHIC STUDIES: I have personally reviewed the radiological images as listed and agreed with the findings in the report. CT ABDOMEN PELVIS WO CONTRAST  Addendum Date: 02/15/2021   ADDENDUM REPORT: 02/15/2021 10:27 ADDENDUM: Signs of potential fishbone in the colon well upstream from the rectal mass. Suggest attention on follow-up as these can occasionally lead to bowel perforation, this is an unrelated finding and not currently associated with inflammation. (Image 82/6 and image 25/5) These results will be called to the ordering clinician or representative by the Radiologist Assistant, and communication documented in the PACS or [employer]. Electronically Signed   By: Zetta Bills M.D.   On: 02/15/2021 10:27   Result Date: 02/15/2021 CLINICAL DATA:  New rectal cancer with severe pain. EXAM: CT ABDOMEN AND PELVIS WITHOUT CONTRAST TECHNIQUE: Multidetector CT imaging of the abdomen and pelvis was performed following the standard protocol without IV contrast. COMPARISON:  PET CT that was performed on February 13, 2021. FINDINGS: Lower chest: Incidental imaging of the lung bases is unremarkable. Hepatobiliary: Small low-density lesion in the RIGHT hepatic lobe (image 9/2 6 mm, likely a small cyst. Small cyst along the RIGHT hepatic margin. No pericholecystic stranding. No gross biliary duct distension. Pancreas: Pancreas with mild atrophy. No sign of adjacent inflammation. Spleen: Spleen normal size and contour. Adrenals/Urinary Tract: Adrenal glands are normal. Symmetric smooth contour of bilateral kidneys. No hydronephrosis. No nephrolithiasis. Urinary bladder with smooth contours but extending into the lower  abdomen with moderate to marked distension. Stomach/Bowel: Stomach without evidence of adjacent stranding. No small bowel dilation. Normal appendix. Colon is stool filled without signs of obstruction. At the descending/sigmoid junction there is a linear area of increased calcific density within the fecal stream potentially a small fishbone. No adjacent stranding or free air. Distal to this is the large irregular colonic tumor with increasing size of a horseshoe shaped area of low attenuation about the rectum. Small fluid collection lateral to the pubic 0 wreck talus muscle on image 82 of series 2 contains gas and has developed in the short interval since the study of May Barry, 2022, quite subtle on the PET scan of only 1 day ago now containing gas, not containing gas on the recent PET exam. Small amount a of gas also seen in the fluid density that tracks into the perianal region, potentially within the sphincter complex in the intersphincteric plane though not clearly delineated on the current study bulging normal anatomy along the region of the sphincter complex. Ulcerated and irregular circumferential mass. Fluid begins above the pelvic floor and tracks below the pelvic floor along with LEFT ischial rectal fossa collection described above. Associated nodal enlargement in the RIGHT hemipelvis as as outlined on the previous PET exam along with hypogastric lymph nodes and extensive mesorectal soft tissue. Inguinal lymph nodes with enlargement, potentially reactive given above findings. No free intraperitoneal air. Vascular/Lymphatic: No retroperitoneal adenopathy. Calcified atheromatous plaque of the abdominal aorta. No aneurysmal dilation. Reproductive: Inseparable from the process in the mesorectum. Other: No free air.  No ascites. Musculoskeletal: No acute bone finding or destructive bone process. Spinal degenerative changes. IMPRESSION: Signs of presumed perforated rectal mass with contained perforation with fluid  and gas extending above and below the pelvic floor, potentially involving the sphincter complex and extending into LEFT ischial rectal fossa. GI and or surgical consultation may be helpful for further evaluation. Fluid and gas has developed since the May Barry, 2021 evaluation where there was only minimal low-attenuation outside of the rectum. Signs of  nodal disease in the pelvis is outlined on the PET exam. Electronically Signed: By: Zetta Bills M.D. On: 02/14/2021 13:38   CT ABDOMEN PELVIS WO CONTRAST  Result Date: 5/Barry/2022 CLINICAL DATA:  Elevated white blood count. EXAM: CT ABDOMEN AND PELVIS WITHOUT CONTRAST TECHNIQUE: Multidetector CT imaging of the abdomen and pelvis was performed following the standard protocol without IV contrast. COMPARISON:  Jan 08, 2020. FINDINGS: Lower chest: No acute abnormality. Hepatobiliary: No gallstones or biliary dilatation is noted. Grossly stable 1 cm low density is noted along inferior portion of right hepatic lobe. Stable probable small cyst is seen posteriorly in the right hepatic lobe. Enhancing abnormality seen on prior exam in left hepatic lobe is not visualized currently due to the lack of intravenous contrast administration. Pancreas: Unremarkable. No pancreatic ductal dilatation or surrounding inflammatory changes. Spleen: Normal in size without focal abnormality. Adrenals/Urinary Tract: Adrenal glands are unremarkable. Kidneys are normal, without renal calculi, focal lesion, or hydronephrosis. Bladder is unremarkable. Stomach/Bowel: The stomach appears normal. The appendix appears normal. There is no evidence of bowel obstruction. There appears to be significantly increased wall thickening involving the rectum with a significantly increased amount of inflammatory change in the perirectal soft tissues. This is concerning for rectal malignancy or possibly severe proctitis. Stable linear high density is seen adjacent to sigmoid colon concerning for ingested bone  fragment. Vascular/Lymphatic: Aortic atherosclerosis. 3.1 x 2.3 cm right internal iliac lymph node is noted which is new since prior exam and highly concerning for metastatic disease. Reproductive: Prostate is unremarkable. Other: No abdominal wall hernia or abnormality. No abdominopelvic ascites. Musculoskeletal: No acute or significant osseous findings. IMPRESSION: Severe rectal wall thickening is noted with surrounding inflammation which is significantly worse compared to prior exam. There also appears to be a 3.1 x 2.3 cm right internal iliac lymph node which is concerning for metastatic disease. These findings are concerning for rectal malignancy or severe proctitis. Sigmoidoscopy is recommended. Enhancing abnormality seen in left hepatic lobe on prior exam is not visualized currently due to lack of intravenous contrast. Electronically Signed   By: Marijo Conception M.D.   On: 05/Barry/2022 11:30   CT PELVIS W CONTRAST  Result Date: 02/20/2021 CLINICAL DATA:  55 year old with rectal cancer with a percutaneous drain. [name] complains of fluid leaking around the drain. EXAM: CT PELVIS WITH CONTRAST TECHNIQUE: Multidetector CT imaging of the pelvis was performed using the standard protocol following the bolus administration of intravenous contrast. CONTRAST:  121m OMNIPAQUE IOHEXOL 300 MG/ML  SOLN COMPARISON:  CT 02/14/2021 FINDINGS: Urinary Tract: Fluid-filled urinary bladder. Right kidney is only partially imaged. Bowel: Known large rectal mass with circumferential thickening in the rectum and anal region. No significant dilatation of the sigmoid colon or other visualized portions of the colon. Normal caliber of the visualized small bowel. Vascular/Lymphatic: Extensive atherosclerotic calcifications. Again noted is low-density nodule or lymph node in the along the right pelvic wall that measures 3.1 x 2.5 cm on sequence 5 image Barry. Prominent inguinal lymph nodes bilaterally. Reproductive:  Prostate is poorly  visualized. Other: Percutaneous drain was placed in the perirectal/perianal abscess on 02/15/2021. The large perirectal abscess has markedly decreased in size. Small residual pocket of gas and fluid just inferior to the drain on sequence 5, image 48 that measures 3.0 x 2.4 x 2.2 cm. There is new gas in the subcutaneous tissues of the medial left buttock and there is concern for an ulceration or wound in this area. Scattered gas-filled collections along the medial  left buttock and perirectal region. Cannot exclude a fistula connection between the rectal mass and the subcutaneous gas-filled collections. Musculoskeletal: No acute bone abnormality. IMPRESSION: 1. Large perirectal/perianal abscess has markedly decreased in size since placement of the percutaneous drain. There are residual gas-filled collections in the soft tissues and suspect there is a fistula or sinus tract between the rectal mass and the subcutaneous tissues. 2. Soft tissue gas along the medial left buttock and concern for a cutaneous ulceration in this area. 3. Large rectal mass with evidence for a large necrotic right pelvic lymph node. Electronically Signed   By: Markus Daft M.D.   On: 02/20/2021 15:42   MR Pelvis Wo Contrast  Result Date: 03/12/2021 CLINICAL DATA:  Newly diagnosed rectal carcinoma. EXAM: MRI PELVIS WITHOUT CONTRAST TECHNIQUE: Multiplanar multisequence MR imaging of the pelvis was performed. No intravenous contrast was administered. COMPARISON:  None. FINDINGS: TUMOR LOCATION Tumor distance from Anal Verge/Skin Surface: 1.4 cm Tumor distance from Internal Anal Sphincter: 0 cm; the tumor directly involves the upper and mid anal sphincter TUMOR DESCRIPTION Circumferential Extent: 100% Tumor Length: 11.2 cm T - CATEGORY Extension through Muscularis Propria: Yes, measuring up to 23 mm = T3d Shortest Distance from Mass to Mesorectal Fascia: 0 mm Extramural Vascular Invasion/Tumor Thrombus: No Invasion of Anterior Peritoneal  Reflection: No Involvement of Adjacent Organs or Pelvic Sidewall: No Levator Ani Involvement: Yesa N - CATEGORY Mesorectal Lymph Nodes >=40m: 4 or more=N2 Extra-mesorectal Lymphadenopathy: Yes, bilateral internal iliac lymphadenopathy is seen, with largest on the right measuring 2.3 cm in short axis = N2. Shotty less than 1 cm bilateral inguinal lymph nodes are seen, which are not pathologically enlarged. Other:  None. IMPRESSION: Assuming pathologic diagnosis of rectal adenocarcinoma, local staging is T3d, N2, Mx. This tumor shows involvement of the anal sphincter. Electronically Signed   By: JMarlaine HindM.D.   On: 03/12/2021 08:39   PERIPHERAL VASCULAR CATHETERIZATION  Result Date: 02/27/2021 See surgical note for result.  NM PET Image Initial (PI) Skull Base To Thigh  Addendum Date: 02/14/2021   ADDENDUM REPORT: 02/14/2021 13:43 ADDENDUM: For clarification the comparison from May 2nd is from 2021. Nodal disease that is present on the current study and the most recent comparison is not visible on the previous more remote CT assessment. Nor is the fluid density in the area of the mesorectum that tracks above and below the pelvic floor as described. Additionally, there is a spiculated nodule in the LEFT upper lobe with morphologic features that raise the question of small bronchogenic neoplasm measuring 9 x 8 mm (8 x 8 mm in the initial report). Referral to multi disciplinary thoracic oncologic setting may be helpful for further assessment. In the current context could consider short interval follow-up or biopsy for further assessment as warranted. These results will be called to the ordering clinician or representative by the Radiologist Assistant, and communication documented in the PACS or [employer] Electronically Signed   By: GZetta BillsM.D.   On: 02/14/2021 13:43   Addendum Date: 02/14/2021   ADDENDUM REPORT: 02/14/2021 13:21 ADDENDUM: These results were called to the ordering clinician or  representative by the Radiologist Assistant, and communication documented in the PACS or [employer] Electronically Signed   By: GZetta BillsM.D.   On: 02/14/2021 13:21   Result Date: 02/14/2021 CLINICAL DATA:  Initial treatment strategy for rectal cancer in a Barry [name]. [name] reports severe pain in symptoms that made the examination challenging. EXAM: NUCLEAR MEDICINE PET SKULL  BASE TO THIGH TECHNIQUE: 7.97 mCi F-18 FDG was injected intravenously. Full-ring PET imaging was performed from the skull base to thigh after the radiotracer. CT data was obtained and used for attenuation correction and anatomic localization. Fasting blood glucose: 107 mg/dl COMPARISON:  CT of the abdomen and pelvis 16-Feb-2021 and CT of the abdomen and pelvis Jan 18, 2021. FINDINGS: Mediastinal blood pool activity: SUV max 2.05 Liver activity: SUV max NA NECK: Asymmetric muscular activity in the neck in prevertebral musculature. LEFT over RIGHT without visible lesion. Incidental CT findings: none CHEST: Spiculated nodule in the LEFT upper lobe measures approximately 8 x 8 mm with some surrounding spiculation (image [id number]) metabolic activity not elevated above mediastinal blood pool. Signs of pulmonary emphysema. Airways are patent. No adenopathy in the chest or signs of suspicious nodule elsewhere. Incidental CT findings: Calcified atherosclerotic plaque of the thoracic aorta. 4 cm caliber of the ascending thoracic aorta. Minimal scattered atherosclerotic plaque with calcification. Heart size normal without pericardial effusion. Normal caliber central pulmonary vessels. Normal appearance of the esophagus. ABDOMEN/PELVIS: No signs of solid organ metastasis. Cyst in the RIGHT hepatic lobe. Marked hypermetabolic activity about the [name]'s known rectal mass. This mass appears less well-defined. The mesorectum is filled with low attenuation to the LEFT posterolateral aspect of the mesorectum with crescentic appearance  tracking into the upper sphincter complex, horseshoe shaped low attenuation surrounds the rectum and anus as it extends towards the pelvic floor. Mass measuring approximately 6.5 x 4.9 cm (image 248/3) maximum SUV 17.6. Lenticular area of low attenuation with some peripheral increased metabolic activity, more nodular along the LEFT posterolateral margin on image 257 of series 3. This area measures approximately 2.7 cm in greatest thickness and surrounds the rectum and anus approximately 270 degrees of its circumference above the pelvic floor and at the pelvic floor slightly less. Bulky RIGHT pelvic sidewall/hypogastric lymph node outside of the mesorectum with stippled calcification measuring 19 mm and without signs of increased metabolic activity. Small LEFT hypogastric lymph node just peripheral to the internal external bifurcation on image 228 of series 3 displays low level FDG uptake with a maximum SUV of 3.2. High RIGHT internal just below or at the internal/common iliac transition, lymph node (image 214/3) 12 mm maximum SUV of 4.8. LEFT high hypogastric lymph node (image 217/3) 8 mm with low level FDG uptake, maximum SUV of 3.0. Scattered lymph nodes throughout the retroperitoneum with FDG uptake only slightly above mediastinal blood pool. For instance on image 181 of series 3 is an 8 mm lymph node with a maximum SUV of 2.2 along the LEFT periaortic chain. Bilateral inguinal lymph nodes largest on the RIGHT (image 248/3) 11 mm with a maximum SUV of 2.8 Primary tumor difficult to assess in terms of margins due to surrounding stranding which has worsened since previous imaging. Incidental CT findings: Tiny hypodensity in the RIGHT hemi liver (image 138/3) no focal increased FDG uptake though this 4 mm area may be below PET resolution in this context. SKELETON: Diffuse increased metabolic activity throughout the visualized skeleton without focal area of activity to suggest bony metastasis. Incidental CT findings:  none IMPRESSION: Signs of locally advanced rectal cancer with ballooning of the mesorectum now with low attenuation material that was not present on previous examinations (not present on 2023-01-19 and much less pronounced on 02/17/23) with extensive stranding and inflammation. Findings suspicious for rectal compromise/focal defect in the rectum at the site of tumor or along the LEFT posterolateral aspect  and contained inflammation in the mesorectum. Rectal tumor also appears bulkier in general and part of this could be due to marked worsening in short interval of rectal tumor. Large lymph node along the RIGHT pelvic sidewall and smaller lymph nodes as outlined above in the pelvis outside of the mesorectum including inguinal lymph nodes which are suspicious for disease. Note that the dominant lymph node displays little FDG uptake, potentially due to necrosis. Lymph nodes in the groin are equivocal in the setting of extensive inflammation and potentially reactive showing enlargement since previous imaging from May 2nd. No signs of solid organ metastasis. Diffuse marrow uptake likely related to symptomatic anemia. Electronically Signed: By: Zetta Bills M.D. On: 02/14/2021 08:58   CT IMAGE GUIDED DRAINAGE BY PERCUTANEOUS CATHETER  Result Date: 02/15/2021 INDICATION: 55 year old gentleman with perirectal abscess presents to IR for CT-guided drain placement EXAM: CT GUIDED DRAINAGE OF PERIRECTAL ABSCESS MEDICATIONS: The [name] is currently admitted to the hospital and receiving intravenous antibiotics. The antibiotics were administered within an appropriate time frame prior to the initiation of the procedure. ANESTHESIA/SEDATION: Two mg IV Versed 100 mcg IV Fentanyl Moderate Sedation Time:  10 minutes The [name] was continuously monitored during the procedure by the interventional radiology nurse under my direct supervision. COMPLICATIONS: None immediate. TECHNIQUE: Informed written consent was obtained from the  [name] after a thorough discussion of the procedural risks, benefits and alternatives. All questions were addressed. Maximal Sterile Barrier Technique was utilized including caps, mask, sterile gowns, sterile gloves, sterile drape, hand hygiene and skin antiseptic. A timeout was performed prior to the initiation of the procedure. PROCEDURE: [name] position prone on the procedure table. The posterior pelvic wall skin prepped and draped in usual fashion. Following local lidocaine administration, 18 gauge needle advanced into the perirectal fluid collection utilizing CT guidance. 18 gauge needle exchanged for 10 [place] multipurpose pigtail drain over 0.035 inch guidewire. 50 mL purulent sample aspirated and sent for Gram stain and culture. Drain secured to skin with suture and connected to bulb suction. Post placement CT confirmed appropriate placement of the drain. IMPRESSION: 10 [place] multipurpose pigtail drain placed in perirectal abscess. Electronically Signed   By: Miachel Roux M.D.   On: 02/15/2021 15:24        ASSESSMENT & PLAN:  1. Rectal cancer ([place])   2. Iron deficiency anemia due to chronic blood loss   3. Neoplasm related pain   4. Lung nodule   5. Encounter for antineoplastic chemotherapy   Cancer Staging Rectal cancer [facility]) Staging form: Colon and Rectum, AJCC 8th Edition - Clinical stage from 02/08/2021: Stage IIIB (cT4a, cN1, cM0) - Signed by Earlie Server, MD on 03/06/2021   #locally advanced rectal cancer. His case is complicated with a perirectal abscess. Prior to onset of perirectal abscess, on his 5/Barry/2022 scan, he was noted to have right internal iliac lymph node 3 x 1 x 2.3 cm which is concerning for nodal disease.On Subsequent images it is difficult to distinguish whether lymphadenopathy was due to nodal disease versus acute inflammation. 03/07/2021 MRI pelvis cT3 N2. Due to the possible contained perforation on previous CT, possible cT4 disease.   Labs are reviewed and  discussed with [name]. Proceed with cycle 3 FOLFOX. Established care with [name].   #Spiculated lung nodule, likely primary lung cancer. Discussed with IR, CT guided biopsy is difficult given the position and small size. Will monitor for now. His rectal cancer treatment takes priority.   #Iron deficiency anemia Status post IV Venofer treatments  Hemoglobin improved to 10.3  Proceed with IV Venofer on 04/17/2021   #Rectal pain, continue fentanyl patch 67mg Q72 hours. He does not need any short acting narcotics at this point.   Follow up in 2 weeks for lab md FOLFOX All questions were answered. The [name] knows to call the clinic with any problems questions or concerns.    ZEarlie Server MD, PhD Hematology Oncology CThe [facility] Centerat AWinnebago HospitalPager- 3IE:[phone]/04/2021

## 2021-04-15 NOTE — Patient Instructions (Signed)
[facility]  Discharge Instructions: Thank you for choosing [facility] to provide your oncology and hematology care.  If you have a lab appointment with the [facility], please go directly to the [facility] and check in at the registration area.  Wear comfortable clothing and clothing appropriate for easy access to any Portacath or PICC line.   We strive to give you quality time with your provider. You may need to reschedule your appointment if you arrive late (15 or more minutes).  Arriving late affects you and other patients whose appointments are after yours.  Also, if you miss three or more appointments without notifying the office, you may be dismissed from the clinic at the provider's discretion.      For prescription refill requests, have your pharmacy contact our office and allow 72 hours for refills to be completed.    Today you received the following chemotherapy and/or immunotherapy agents Oxaliplatin, leucovorin, adrucil   To help prevent nausea and vomiting after your treatment, we encourage you to take your nausea medication as directed.  BELOW ARE SYMPTOMS THAT SHOULD BE REPORTED IMMEDIATELY: *FEVER GREATER THAN 100.4 F (38 C) OR HIGHER *CHILLS OR SWEATING *NAUSEA AND VOMITING THAT IS NOT CONTROLLED WITH YOUR NAUSEA MEDICATION *UNUSUAL SHORTNESS OF BREATH *UNUSUAL BRUISING OR BLEEDING *URINARY PROBLEMS (pain or burning when urinating, or frequent urination) *BOWEL PROBLEMS (unusual diarrhea, constipation, pain near the anus) TENDERNESS IN MOUTH AND THROAT WITH OR WITHOUT PRESENCE OF ULCERS (sore throat, sores in mouth, or a toothache) UNUSUAL RASH, SWELLING OR PAIN  UNUSUAL VAGINAL DISCHARGE OR ITCHING   Items with * indicate a potential emergency and should be followed up as soon as possible or go to the Emergency Department if any problems should occur.  Please show the CHEMOTHERAPY ALERT CARD or IMMUNOTHERAPY  ALERT CARD at check-in to the Emergency Department and triage nurse.  Should you have questions after your visit or need to cancel or reschedule your appointment, please contact [facility]  [phone] and follow the prompts.  Office hours are 8:00 a.m. to 4:30 p.m. Monday - Friday. Please note that voicemails left after 4:00 p.m. may not be returned until the following business day.  We are closed weekends and major holidays. You have access to a nurse at all times for urgent questions. Please call the main number to the clinic [phone] and follow the prompts.  For any non-urgent questions, you may also contact your provider using MyChart. We now offer e-Visits for anyone 31 and older to request care online for non-urgent symptoms. For details visit [email or web address].   Also download the MyChart app! Go to the app store, search "MyChart", open the app, select [facility], and log in with your MyChart username and password.  Due to Covid, a mask is required upon entering the hospital/clinic. If you do not have a mask, one will be given to you upon arrival. For doctor visits, patients may have 1 support person aged 90 or older with them. For treatment visits, patients cannot have anyone with them due to current Covid guidelines and our immunocompromised population.

## 2021-04-15 NOTE — Progress Notes (Signed)
Nutrition Assessment   Reason for Assessment:   Patient identified on Malnutrition Screening report for weight loss   ASSESSMENT: 55 year old male with rectal cancer. Patient had percutaneous drain placed in large perirectal/perianal abscess.  Patient has started folfox  Met with patient during infusion today.  Patient reports that his appetite has improved and eating well.  Reports that he eats breakfast of eggs, toast, grits, sometimes bacon, sausage.  Lunch and dinner are usually meat with vegetables.  Drinks ensure 2 times per day (original) and V-8 juice.  Reports having regular bowel movements.  No nausea.  Reports that appetite is much better than several months ago.    Nutrition Focused Physical Exam:   Thigh - normal Calf- normal    Medications: reviewed   Labs: reviewed   Anthropometrics:   Height: 68 inches Weight: 158 lb today UBW: 180-195 lb about 8-9 months ago per patient BMI: 24  Noted weight of 149 lb 6/22  12% weight loss in the last 8-9 months per patient  Estimated Energy Needs  Kcals: 1800-2100 Protein: 86-108 g Fluid: 1.8 L   NUTRITION DIAGNOSIS: Inadequate oral intake related to cancer as evidenced by weight loss down to 149 lb but trending up   INTERVENTION:  Discussed importance of good nutrition during treatment. Encouraged well balanced diet and good sources of protein Questions answered.  Continue ensure shakes. Complimentary case of shakes given to patient today.  Contact information given to patient.    MONITORING, EVALUATION, GOAL: weight trends, intake   Next Visit: Monday, August 22 during infusion  Shelbee Apgar B. Zenia Resides, [place], [place] Registered Dietitian [phone] (mobile)

## 2021-04-17 ENCOUNTER — Inpatient Hospital Stay: Payer: Self-pay

## 2021-04-17 ENCOUNTER — Other Ambulatory Visit: Payer: Self-pay

## 2021-04-17 VITALS — BP 111/73 | HR 98 | Temp 97.2°F | Resp 18

## 2021-04-17 DIAGNOSIS — C20 Malignant neoplasm of rectum: Secondary | ICD-10-CM

## 2021-04-17 DIAGNOSIS — D500 Iron deficiency anemia secondary to blood loss (chronic): Secondary | ICD-10-CM

## 2021-04-17 MED ORDER — SODIUM CHLORIDE 0.9% FLUSH
10.0000 mL | INTRAVENOUS | Status: DC | PRN
  Administered 2021-04-17: 10 mL
  Filled 2021-04-17: qty 10

## 2021-04-17 MED ORDER — SODIUM CHLORIDE 0.9 % IV SOLN
Freq: Once | INTRAVENOUS | Status: AC
  Filled 2021-04-17: qty 250

## 2021-04-17 MED ORDER — HEPARIN SOD (PORK) LOCK FLUSH 100 UNIT/ML IV SOLN
500.0000 [IU] | Freq: Once | INTRAVENOUS | Status: AC | PRN
  Administered 2021-04-17: 500 [IU]
  Filled 2021-04-17: qty 5

## 2021-04-17 MED ORDER — SODIUM CHLORIDE 0.9 % IV SOLN: 200.0000 mg | Freq: Once | INTRAVENOUS | Status: DC

## 2021-04-17 MED ORDER — IRON SUCROSE 20 MG/ML IV SOLN
200.0000 mg | Freq: Once | INTRAVENOUS | Status: AC
  Administered 2021-04-17: 200 mg via INTRAVENOUS
  Filled 2021-04-17: qty 10

## 2021-04-17 NOTE — Patient Instructions (Signed)
[facility]COL[facility]  Discharge Instructions: Thank you for choosing [facility] to provide your oncology and hematology care.  If you have a lab appointment with the [facility], please go directly to the [facility] and check in at the registration area.  Wear comfortable clothing and clothing appropriate for easy access to any Portacath or PICC line.   We strive to give you quality time with your provider. You may need to reschedule your appointment if you arrive late (15 or more minutes).  Arriving late affects you and other patients whose appointments are after yours.  Also, if you miss three or more appointments without notifying the office, you may be dismissed from the clinic at the provider's discretion.      For prescription refill requests, have your pharmacy contact our office and allow 72 hours for refills to be completed.    Today you received the following chemotherapy and/or immunotherapy agents VENOFER       To help prevent nausea and vomiting after your treatment, we encourage you to take your nausea medication as directed.  BELOW ARE SYMPTOMS THAT SHOULD BE REPORTED IMMEDIATELY: *FEVER GREATER THAN 100.4 F (38 C) OR HIGHER *CHILLS OR SWEATING *NAUSEA AND VOMITING THAT IS NOT CONTROLLED WITH YOUR NAUSEA MEDICATION *UNUSUAL SHORTNESS OF BREATH *UNUSUAL BRUISING OR BLEEDING *URINARY PROBLEMS (pain or burning when urinating, or frequent urination) *BOWEL PROBLEMS (unusual diarrhea, constipation, pain near the anus) TENDERNESS IN MOUTH AND THROAT WITH OR WITHOUT PRESENCE OF ULCERS (sore throat, sores in mouth, or a toothache) UNUSUAL RASH, SWELLING OR PAIN  UNUSUAL VAGINAL DISCHARGE OR ITCHING   Items with * indicate a potential emergency and should be followed up as soon as possible or go to the Emergency Department if any problems should occur.  Please show the CHEMOTHERAPY ALERT CARD or IMMUNOTHERAPY ALERT CARD at check-in  to the Emergency Department and triage nurse.  Should you have questions after your visit or need to cancel or reschedule your appointment, please contact [facility]  [phone] and follow the prompts.  Office hours are 8:00 a.m. to 4:30 p.m. Monday - Friday. Please note that voicemails left after 4:00 p.m. may not be returned until the following business day.  We are closed weekends and major holidays. You have access to a nurse at all times for urgent questions. Please call the main number to the clinic [phone] and follow the prompts.  For any non-urgent questions, you may also contact your provider using MyChart. We now offer e-Visits for anyone 69 and older to request care online for non-urgent symptoms. For details visit [email or web address].   Also download the MyChart app! Go to the app store, search "MyChart", open the app, select [facility], and log in with your MyChart username and password.  Due to Covid, a mask is required upon entering the hospital/clinic. If you do not have a mask, one will be given to you upon arrival. For doctor visits, patients may have 1 support person aged 68 or older with them. For treatment visits, patients cannot have anyone with them due to current Covid guidelines and our immunocompromised population.

## 2021-04-19 ENCOUNTER — Telehealth: Payer: Self-pay | Admitting: *Deleted

## 2021-04-19 ENCOUNTER — Encounter: Payer: Self-pay | Admitting: Nurse Practitioner

## 2021-04-19 NOTE — Telephone Encounter (Signed)
Patient called to report that he has been constipated since yesterday. He is taking his stool softener twice a day. He would like advice on how to manage his constipation.

## 2021-04-19 NOTE — Telephone Encounter (Signed)
Sonia Baller will you please advise. Dr. Tasia Catchings is gone.

## 2021-04-22 ENCOUNTER — Encounter: Payer: Self-pay | Admitting: Oncology

## 2021-04-22 NOTE — Telephone Encounter (Signed)
Pt had also sent Mychart message and Dr. Tasia Catchings replied back.

## 2021-04-24 ENCOUNTER — Encounter: Payer: Self-pay | Admitting: Oncology

## 2021-04-29 ENCOUNTER — Inpatient Hospital Stay: Payer: Self-pay

## 2021-04-29 ENCOUNTER — Inpatient Hospital Stay (HOSPITAL_BASED_OUTPATIENT_CLINIC_OR_DEPARTMENT_OTHER): Payer: Self-pay | Admitting: Oncology

## 2021-04-29 ENCOUNTER — Encounter: Payer: Self-pay | Admitting: Oncology

## 2021-04-29 VITALS — BP 131/90 | HR 98 | Temp 96.4°F | Resp 18 | Wt 158.9 lb

## 2021-04-29 DIAGNOSIS — C20 Malignant neoplasm of rectum: Secondary | ICD-10-CM

## 2021-04-29 DIAGNOSIS — Z5111 Encounter for antineoplastic chemotherapy: Secondary | ICD-10-CM

## 2021-04-29 DIAGNOSIS — D500 Iron deficiency anemia secondary to blood loss (chronic): Secondary | ICD-10-CM

## 2021-04-29 DIAGNOSIS — G893 Neoplasm related pain (acute) (chronic): Secondary | ICD-10-CM

## 2021-04-29 LAB — COMPREHENSIVE METABOLIC PANEL
ALT: 60 U/L — ABNORMAL HIGH (ref 0–44)
AST: 54 U/L — ABNORMAL HIGH (ref 15–41)
Albumin: 3.3 g/dL — ABNORMAL LOW (ref 3.5–5.0)
Alkaline Phosphatase: 98 U/L (ref 38–126)
Anion gap: 4 — ABNORMAL LOW (ref 5–15)
BUN: 10 mg/dL (ref 6–20)
CO2: 30 mmol/L (ref 22–32)
Calcium: 8.7 mg/dL — ABNORMAL LOW (ref 8.9–10.3)
Chloride: 101 mmol/L (ref 98–111)
Creatinine, Ser: 0.91 mg/dL (ref 0.61–1.24)
GFR, Estimated: >60 mL/min (ref >60)
Glucose, Bld: 135 mg/dL — ABNORMAL HIGH (ref 70–99)
Potassium: 4.2 mmol/L (ref 3.5–5.1)
Sodium: 135 mmol/L (ref 135–145)
Total Bilirubin: 0.6 mg/dL (ref 0.3–1.2)
Total Protein: 7.8 g/dL (ref 6.5–8.1)

## 2021-04-29 LAB — CBC WITH DIFFERENTIAL/PLATELET
Abs Immature Granulocytes: 0.03 10*3/uL (ref 0.00–0.07)
Basophils Absolute: 0 10*3/uL (ref 0.0–0.1)
Basophils Relative: 0 %
Eosinophils Absolute: 0.3 10*3/uL (ref 0.0–0.5)
Eosinophils Relative: 4 %
HCT: 35.4 % — ABNORMAL LOW (ref 39.0–52.0)
Hemoglobin: 11.3 g/dL — ABNORMAL LOW (ref 13.0–17.0)
Immature Granulocytes: 0 %
Lymphocytes Relative: 19 %
Lymphs Abs: 1.3 10*3/uL (ref 0.7–4.0)
MCH: 26.2 pg (ref 26.0–34.0)
MCHC: 31.9 g/dL (ref 30.0–36.0)
MCV: 82.1 fL (ref 80.0–100.0)
Monocytes Absolute: 1 10*3/uL (ref 0.1–1.0)
Monocytes Relative: 14 %
Neutro Abs: 4.6 10*3/uL (ref 1.7–7.7)
Neutrophils Relative %: 63 %
Platelets: 163 10*3/uL (ref 150–400)
RBC: 4.31 MIL/uL (ref 4.22–5.81)
RDW: 23 % — ABNORMAL HIGH (ref 11.5–15.5)
WBC: 7.3 10*3/uL (ref 4.0–10.5)
nRBC: 0 % (ref 0.0–0.2)

## 2021-04-29 MED ORDER — SODIUM CHLORIDE 0.9 % IV SOLN
2400.0000 mg/m2 | INTRAVENOUS | Status: DC
  Administered 2021-04-29: 4350 mg via INTRAVENOUS
  Filled 2021-04-29: qty 87

## 2021-04-29 MED ORDER — FENTANYL 50 MCG/HR TD PT72: 1.0000 | MEDICATED_PATCH | TRANSDERMAL | 0 refills | Status: DC

## 2021-04-29 MED ORDER — LEUCOVORIN CALCIUM INJECTION 350 MG
412.0000 mg/m2 | Freq: Once | INTRAVENOUS | Status: AC
  Administered 2021-04-29: 750 mg via INTRAVENOUS
  Filled 2021-04-29: qty 35

## 2021-04-29 MED ORDER — OXALIPLATIN CHEMO INJECTION 100 MG/20ML
82.0000 mg/m2 | Freq: Once | INTRAVENOUS | Status: AC
  Administered 2021-04-29: 150 mg via INTRAVENOUS
  Filled 2021-04-29: qty 20

## 2021-04-29 MED ORDER — PALONOSETRON HCL INJECTION 0.25 MG/5ML
0.2500 mg | Freq: Once | INTRAVENOUS | Status: AC
  Administered 2021-04-29: 0.25 mg via INTRAVENOUS

## 2021-04-29 MED ORDER — DEXTROSE 5 % IV SOLN
Freq: Once | INTRAVENOUS | Status: AC
  Filled 2021-04-29: qty 250

## 2021-04-29 MED ORDER — FLUOROURACIL CHEMO INJECTION 2.5 GM/50ML
400.0000 mg/m2 | Freq: Once | INTRAVENOUS | Status: AC
  Administered 2021-04-29: 750 mg via INTRAVENOUS
  Filled 2021-04-29: qty 15

## 2021-04-29 MED ORDER — SODIUM CHLORIDE 0.9 % IV SOLN
10.0000 mg | Freq: Once | INTRAVENOUS | Status: AC
  Administered 2021-04-29: 10 mg via INTRAVENOUS
  Filled 2021-04-29: qty 10

## 2021-04-29 NOTE — Patient Instructions (Signed)
[facility]  Discharge Instructions: Thank you for choosing [facility] to provide your oncology and hematology care.  If you have a lab appointment with the Cancer Center, please go directly to the Cancer Center and check in at the registration area.  Wear comfortable clothing and clothing appropriate for easy access to any Portacath or PICC line.   We strive to give you quality time with your provider. You may need to reschedule your appointment if you arrive late (15 or more minutes).  Arriving late affects you and other patients whose appointments are after yours.  Also, if you miss three or more appointments without notifying the office, you may be dismissed from the clinic at the provider's discretion.      For prescription refill requests, have your pharmacy contact our office and allow 72 hours for refills to be completed.    Today you received the following chemotherapy and/or immunotherapy agents : Folfox   To help prevent nausea and vomiting after your treatment, we encourage you to take your nausea medication as directed.  BELOW ARE SYMPTOMS THAT SHOULD BE REPORTED IMMEDIATELY: *FEVER GREATER THAN 100.4 F (38 C) OR HIGHER *CHILLS OR SWEATING *NAUSEA AND VOMITING THAT IS NOT CONTROLLED WITH YOUR NAUSEA MEDICATION *UNUSUAL SHORTNESS OF BREATH *UNUSUAL BRUISING OR BLEEDING *URINARY PROBLEMS (pain or burning when urinating, or frequent urination) *BOWEL PROBLEMS (unusual diarrhea, constipation, pain near the anus) TENDERNESS IN MOUTH AND THROAT WITH OR WITHOUT PRESENCE OF ULCERS (sore throat, sores in mouth, or a toothache) UNUSUAL RASH, SWELLING OR PAIN  UNUSUAL VAGINAL DISCHARGE OR ITCHING   Items with * indicate a potential emergency and should be followed up as soon as possible or go to the Emergency Department if any problems should occur.  Please show the CHEMOTHERAPY ALERT CARD or IMMUNOTHERAPY ALERT CARD at check-in to  the Emergency Department and triage nurse.  Should you have questions after your visit or need to cancel or reschedule your appointment, please contact [facility]  [phone] and follow the prompts.  Office hours are 8:00 a.m. to 4:30 p.m. Monday - Friday. Please note that voicemails left after 4:00 p.m. may not be returned until the following business day.  We are closed weekends and major holidays. You have access to a nurse at all times for urgent questions. Please call the main number to the clinic [phone] and follow the prompts.  For any non-urgent questions, you may also contact your provider using MyChart. We now offer e-Visits for anyone 18 and older to request care online for non-urgent symptoms. For details visit [email or web address].   Also download the MyChart app! Go to the app store, search "MyChart", open the app, select [facility], and log in with your MyChart username and password.  Due to Covid, a mask is required upon entering the hospital/clinic. If you do not have a mask, one will be given to you upon arrival. For doctor visits, patients may have 1 support person aged 18 or older with them. For treatment visits, patients cannot have anyone with them due to current Covid guidelines and our immunocompromised population.  

## 2021-04-29 NOTE — Progress Notes (Signed)
Hematology/Oncology follow up note [facility] [phone] [phone]   Patient Care Team: Pcp, No as PCP - General [name], RN as Oncology Nurse Navigator  REFERRING PROVIDER: No ref. provider found  CHIEF COMPLAINTS/REASON FOR VISIT:  Follow up for rectal cancer treatment  HISTORY OF PRESENTING ILLNESS:   Barry [name]. is a  55 y.o.  male with PMH listed below was seen in consultation at the request of  No ref. provider found  for evaluation of rectal cancer  02/05/2021-02/06/2021 patient was hospitalized due to generalized weakness, intermittent lightheadedness, weight loss and worsening constipation.  02/05/2021 CT abdomen showed concerning of severe rectal wall thickening and right internal iliac lymph node concerning for metastatic disease.  Patient was seen by gastroenterology and had colonoscopy which showed a circumferential fungating mass in the rectum.  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  02/14/2021-02/16/2021 hospitalized due to rectal bleeding and pain.   02/14/2021 CT showed perirectal fluid collection/gas concerning for infection with significant leukocytosis, anemia with hemoglobin of 6.8.  Patient received PRBC transfusion, IV antibiotics.  He underwent an IR guided placement of JP drain into the rectal abscess.  Discharged home with oral Augmentin. 02/20/2021 presented to ER with new skin opening and draining from left gluteus.  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  JP drain has been removed.  Patient was continued on Augmentin.  02/13/2021, PET scan showed locally advanced rectal cancer with billowing of the mesorectum now with low attenuation material that was not present on previous examination with extensive stranding and inflammation.   Bulky RIGHT pelvic sidewall/hypogastric lymph node outside of the mesorectum with stippled calcification measuring 19 mm-no increased metabolic  activity. Small LEFT hypogastric lymph node just peripheral to the internal external bifurcation-SUV 3.2 High RIGHT internal just below or at the internal/common iliac transition, lymph node -SUV 4.8 LEFT high hypogastric lymph node  8 mm-SUV 3. Scattered lymph nodes throughout the retroperitoneum with low FDG uptake Bilateral inguinal lymph nodes largest on the RIGHT (image 248/3) 11 mm with a maximum SUV of 2.8  spiculated nodule in the LEFT upper lobe- 9 x 8 mm  02/14/2021, CT abdomen pelvis without contrast Showed perforated rectal mass with contained perforation with fluid and gas extending above and below the pelvic floor,potentially involving the sphincter complex and extending into LEFT ischial rectal fossa  02/20/2021, CT pelvis with contrast showed Large perirectal/perianal abscess has markedly decreased in size since placement of the percutaneous drain. There are residual gas-filled collections in the soft tissues and suspect there is a fistula or sinus tract between the rectal mass and the subcutaneous tissues. Soft tissue gas along the medial left buttock and concern for a cutaneous ulceration in this area. Large rectal mass with evidence for a large necrotic right pelvic lymph node.   02/27/2021 medi port placed by Dr.Dew 03/13/2021 reports right butt cheek and also left perianal area fullness.he was seen by Dr.Pabon urgently  and had right buttock abscess drained. Left perianal fullness was felt to be due to cancer.   03/13/2021 started [place]  # He was seen by [name] Dr.Mantyh who agrees with TNT followed by surgery.  INTERVAL HISTORY Barry [name]. is a 55 y.o. male who has above history reviewed by me today presents for follow up visit for management of rectal cancer. Problems and complaints are listed below:  .  # Constipation improved.  No new complaints today.  Appetite is good. No blood in  the stool.  He is on fentanyl patch and pain is controlled.  .    Review of  Systems  Constitutional:  Negative for appetite change, chills, fatigue, fever and unexpected weight change.  HENT:   Negative for hearing loss and voice change.   Eyes:  Negative for eye problems and icterus.  Respiratory:  Negative for chest tightness, cough and shortness of breath.   Cardiovascular:  Negative for chest pain and leg swelling.  Gastrointestinal:  Positive for rectal pain. Negative for abdominal distention, abdominal pain, blood in stool and constipation.  Endocrine: Negative for hot flashes.  Genitourinary:  Negative for difficulty urinating, dysuria and frequency.   Musculoskeletal:  Negative for arthralgias.  Skin:  Negative for itching and rash.  Neurological:  Negative for light-headedness and numbness.  Hematological:  Negative for adenopathy. Does not bruise/bleed easily.  Psychiatric/Behavioral:  Negative for confusion.    MEDICAL HISTORY:  Past Medical History:  Diagnosis Date   Headache    Left shoulder pain    Rectal cancer ([place])     SURGICAL HISTORY: Past Surgical History:  Procedure Laterality Date   COLONOSCOPY WITH PROPOFOL N/A 02/06/2021   Procedure: COLONOSCOPY WITH PROPOFOL;  Surgeon: Lin Landsman, MD;  Location: [facility] ENDOSCOPY;  Service: Gastroenterology;  Laterality: N/A;   PORTA CATH INSERTION N/A 02/27/2021   Procedure: PORTA CATH INSERTION;  Surgeon: Algernon Huxley, MD;  Location: [facility];  Service: Cardiovascular;  Laterality: N/A;   ROTATOR CUFF REPAIR Left     SOCIAL HISTORY: Social History   Socioeconomic History   Marital status: Married    Spouse name: Not on file   Number of children: Not on file   Years of education: Not on file   Highest education level: Not on file  Occupational History   Not on file  Tobacco Use   Smoking status: Former    Packs/day: 1.00    Years: 10.00    Pack years: 10.00    Types: Cigars, Cigarettes    Quit date: 02/04/2021    Years since quitting: 0.2   Smokeless tobacco: Never   Substance and Sexual Activity   Alcohol use: Not Currently   Drug use: No   Sexual activity: Not on file  Other Topics Concern   Not on file  Social History Narrative   Not on file   Social Determinants of Health   Financial Resource Strain: Not on file  Food Insecurity: Not on file  Transportation Needs: Not on file  Physical Activity: Not on file  Stress: Not on file  Social Connections: Not on file  Intimate Partner Violence: Not on file    FAMILY HISTORY: Family History  Problem Relation Age of Onset   Cancer Sister    Diabetes Mother    Cancer Maternal Grandmother    Cancer Paternal Grandmother     ALLERGIES:  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  MEDICATIONS:  Current Outpatient Medications  Medication Sig Dispense Refill   feeding supplement (ENSURE ENLIVE / ENSURE PLUS) LIQD Take 237 mLs by mouth daily. 237 mL 12   senna-docusate (SENOKOT-S) 8.6-50 MG tablet Take 2 tablets by mouth daily. 120 tablet 0   amoxicillin (AMOXIL) 875 MG tablet Take by mouth. (Patient not taking: No sig reported)     [START ON 05/06/2021] fentaNYL (DURAGESIC) 50 MCG/HR Place 1 patch onto the skin every 3 (three) days. 10 patch 0   HYDROcodone-acetaminophen (NORCO) 5-325 MG tablet Take 1 tablet by mouth every 6 (  six) hours as needed for moderate pain. (Patient not taking: No sig reported) 90 tablet 0   ibuprofen (ADVIL) 400 MG tablet Take 1 tablet (400 mg total) by mouth every 6 (six) hours as needed. (Patient not taking: Reported on 04/29/2021) 30 tablet 0   lidocaine-prilocaine (EMLA) cream Apply to affected area once (Patient not taking: Reported on 04/29/2021) 30 g 3   nicotine (NICODERM CQ - DOSED IN MG/24 HOURS) 21 mg/24hr patch Place 1 patch (21 mg total) onto the skin daily. (Patient not taking: Reported on 04/29/2021) 28 patch 0   ondansetron (ZOFRAN) 8 MG tablet Take 1 tablet (8 mg total) by mouth 2 (two) times daily as needed for refractory nausea / vomiting. Start on day 3 after  chemotherapy. (Patient not taking: Reported on 04/29/2021) 30 tablet 1   prochlorperazine (COMPAZINE) 10 MG tablet Take 1 tablet (10 mg total) by mouth every 6 (six) hours as needed (Nausea or vomiting). (Patient not taking: Reported on 04/29/2021) 30 tablet 1   Current Facility-Administered Medications  Medication Dose Route Frequency Provider Last Rate Last Admin   fluorouracil (ADRUCIL) 4,350 mg in sodium chloride 0.9 % 63 mL chemo infusion  2,400 mg/m2 (Treatment Plan Recorded) Intravenous 1 day or 1 dose Earlie Server, MD       fluorouracil (ADRUCIL) chemo injection 750 mg  400 mg/m2 (Treatment Plan Recorded) Intravenous Once Earlie Server, MD       leucovorin 750 mg in dextrose 5 % 250 mL infusion  412 mg/m2 (Treatment Plan Recorded) Intravenous Once Earlie Server, MD 144 mL/hr at 04/29/21 1022 750 mg at 04/29/21 1022   oxaliplatin (ELOXATIN) 150 mg in dextrose 5 % 500 mL chemo infusion  82 mg/m2 (Treatment Plan Recorded) Intravenous Once Earlie Server, MD 265 mL/hr at 04/29/21 1023 150 mg at 04/29/21 1023     PHYSICAL EXAMINATION: ECOG PERFORMANCE STATUS: 1 - Symptomatic but completely ambulatory Vitals:   04/29/21 0849  BP: 131/90  Pulse: 98  Resp: 18  Temp: (!) 96.4 F (35.8 C)  SpO2: 99%     Filed Weights   04/29/21 0849  Weight: 158 lb 14.4 oz (72.1 kg)     Physical Exam Constitutional:      Comments: She ambulates independently.  HENT:     Head: Normocephalic and atraumatic.  Eyes:     General: No scleral icterus. Cardiovascular:     Rate and Rhythm: Normal rate and regular rhythm.     Heart sounds: Normal heart sounds.  Pulmonary:     Effort: Pulmonary effort is normal. No respiratory distress.     Breath sounds: No wheezing.  Abdominal:     General: Bowel sounds are normal. There is no distension.     Palpations: Abdomen is soft.  Musculoskeletal:        General: No deformity. Normal range of motion.     Cervical back: Normal range of motion and neck supple.  Skin:     General: Skin is warm and dry.     Findings: No erythema or rash.  Neurological:     Mental Status: He is alert and oriented to person, place, and time. Mental status is at baseline.     Cranial Nerves: No cranial nerve deficit.     Coordination: Coordination normal.  Psychiatric:        Mood and Affect: Mood normal.    LABORATORY DATA:  I have reviewed the data as listed Lab Results  Component Value Date   WBC 7.3 04/29/2021  HGB 11.3 (L) 04/29/2021   HCT 35.4 (L) 04/29/2021   MCV 82.1 04/29/2021   PLT 163 04/29/2021   Recent Labs    04/01/21 0836 04/15/21 0829 04/29/21 0820  NA 134* 134* 135  K 4.0 3.5 4.2  CL 102 100 101  CO2 '26 27 30  '$ GLUCOSE 96 135* 135*  BUN '11 9 10  '$ CREATININE 0.91 0.83 0.91  CALCIUM 8.7* 8.5* 8.7*  GFRNONAA >60 >60 >60  PROT 7.7 7.7 7.8  ALBUMIN 3.3* 3.2* 3.3*  AST 15 16 54*  ALT 15 14 60*  ALKPHOS 83 81 98  BILITOT 0.4 0.5 0.6    Iron/TIBC/Ferritin/ %Sat    Component Value Date/Time   IRON 28 (L) 03/25/2021 1029   TIBC 356 03/25/2021 1029   FERRITIN 118 03/25/2021 1029   IRONPCTSAT 8 (L) 03/25/2021 1029      RADIOGRAPHIC STUDIES: I have personally reviewed the radiological images as listed and agreed with the findings in the report. CT ABDOMEN PELVIS WO CONTRAST  Addendum Date: 02/15/2021   ADDENDUM REPORT: 02/15/2021 10:27 ADDENDUM: Signs of potential fishbone in the colon well upstream from the rectal mass. Suggest attention on follow-up as these can occasionally lead to bowel perforation, this is an unrelated finding and not currently associated with inflammation. (Image 82/6 and image 25/5) These results will be called to the ordering clinician or representative by the Radiologist Assistant, and communication documented in the PACS or [employer]. Electronically Signed   By: Zetta Bills M.D.   On: 02/15/2021 10:27   Result Date: 02/15/2021 CLINICAL DATA:  New rectal cancer with severe pain. EXAM: CT ABDOMEN AND PELVIS  WITHOUT CONTRAST TECHNIQUE: Multidetector CT imaging of the abdomen and pelvis was performed following the standard protocol without IV contrast. COMPARISON:  PET CT that was performed on February 13, 2021. FINDINGS: Lower chest: Incidental imaging of the lung bases is unremarkable. Hepatobiliary: Small low-density lesion in the RIGHT hepatic lobe (image 9/2 6 mm, likely a small cyst. Small cyst along the RIGHT hepatic margin. No pericholecystic stranding. No gross biliary duct distension. Pancreas: Pancreas with mild atrophy. No sign of adjacent inflammation. Spleen: Spleen normal size and contour. Adrenals/Urinary Tract: Adrenal glands are normal. Symmetric smooth contour of bilateral kidneys. No hydronephrosis. No nephrolithiasis. Urinary bladder with smooth contours but extending into the lower abdomen with moderate to marked distension. Stomach/Bowel: Stomach without evidence of adjacent stranding. No small bowel dilation. Normal appendix. Colon is stool filled without signs of obstruction. At the descending/sigmoid junction there is a linear area of increased calcific density within the fecal stream potentially a small fishbone. No adjacent stranding or free air. Distal to this is the large irregular colonic tumor with increasing size of a horseshoe shaped area of low attenuation about the rectum. Small fluid collection lateral to the pubic 0 wreck talus muscle on image 82 of series 2 contains gas and has developed in the short interval since the study of Feb 05, 2021, quite subtle on the PET scan of only 1 day ago now containing gas, not containing gas on the recent PET exam. Small amount a of gas also seen in the fluid density that tracks into the perianal region, potentially within the sphincter complex in the intersphincteric plane though not clearly delineated on the current study bulging normal anatomy along the region of the sphincter complex. Ulcerated and irregular circumferential mass. Fluid begins above  the pelvic floor and tracks below the pelvic floor along with LEFT ischial rectal fossa  collection described above. Associated nodal enlargement in the RIGHT hemipelvis as as outlined on the previous PET exam along with hypogastric lymph nodes and extensive mesorectal soft tissue. Inguinal lymph nodes with enlargement, potentially reactive given above findings. No free intraperitoneal air. Vascular/Lymphatic: No retroperitoneal adenopathy. Calcified atheromatous plaque of the abdominal aorta. No aneurysmal dilation. Reproductive: Inseparable from the process in the mesorectum. Other: No free air.  No ascites. Musculoskeletal: No acute bone finding or destructive bone process. Spinal degenerative changes. IMPRESSION: Signs of presumed perforated rectal mass with contained perforation with fluid and gas extending above and below the pelvic floor, potentially involving the sphincter complex and extending into LEFT ischial rectal fossa. GI and or surgical consultation may be helpful for further evaluation. Fluid and gas has developed since the Feb 06, 2020 evaluation where there was only minimal low-attenuation outside of the rectum. Signs of nodal disease in the pelvis is outlined on the PET exam. Electronically Signed: By: Zetta Bills M.D. On: 02/14/2021 13:38   CT ABDOMEN PELVIS WO CONTRAST  Result Date: 02/05/2021 CLINICAL DATA:  Elevated white blood count. EXAM: CT ABDOMEN AND PELVIS WITHOUT CONTRAST TECHNIQUE: Multidetector CT imaging of the abdomen and pelvis was performed following the standard protocol without IV contrast. COMPARISON:  Jan 08, 2020. FINDINGS: Lower chest: No acute abnormality. Hepatobiliary: No gallstones or biliary dilatation is noted. Grossly stable 1 cm low density is noted along inferior portion of right hepatic lobe. Stable probable small cyst is seen posteriorly in the right hepatic lobe. Enhancing abnormality seen on prior exam in left hepatic lobe is not visualized currently due  to the lack of intravenous contrast administration. Pancreas: Unremarkable. No pancreatic ductal dilatation or surrounding inflammatory changes. Spleen: Normal in size without focal abnormality. Adrenals/Urinary Tract: Adrenal glands are unremarkable. Kidneys are normal, without renal calculi, focal lesion, or hydronephrosis. Bladder is unremarkable. Stomach/Bowel: The stomach appears normal. The appendix appears normal. There is no evidence of bowel obstruction. There appears to be significantly increased wall thickening involving the rectum with a significantly increased amount of inflammatory change in the perirectal soft tissues. This is concerning for rectal malignancy or possibly severe proctitis. Stable linear high density is seen adjacent to sigmoid colon concerning for ingested bone fragment. Vascular/Lymphatic: Aortic atherosclerosis. 3.1 x 2.3 cm right internal iliac lymph node is noted which is new since prior exam and highly concerning for metastatic disease. Reproductive: Prostate is unremarkable. Other: No abdominal wall hernia or abnormality. No abdominopelvic ascites. Musculoskeletal: No acute or significant osseous findings. IMPRESSION: Severe rectal wall thickening is noted with surrounding inflammation which is significantly worse compared to prior exam. There also appears to be a 3.1 x 2.3 cm right internal iliac lymph node which is concerning for metastatic disease. These findings are concerning for rectal malignancy or severe proctitis. Sigmoidoscopy is recommended. Enhancing abnormality seen in left hepatic lobe on prior exam is not visualized currently due to lack of intravenous contrast. Electronically Signed   By: Marijo Conception M.D.   On: 02/05/2021 11:30   CT PELVIS W CONTRAST  Result Date: 02/20/2021 CLINICAL DATA:  55 year old with rectal cancer with a percutaneous drain. Patient complains of fluid leaking around the drain. EXAM: CT PELVIS WITH CONTRAST TECHNIQUE: Multidetector CT  imaging of the pelvis was performed using the standard protocol following the bolus administration of intravenous contrast. CONTRAST:  137m OMNIPAQUE IOHEXOL 300 MG/ML  SOLN COMPARISON:  CT 02/14/2021 FINDINGS: Urinary Tract: Fluid-filled urinary bladder. Right kidney is only partially imaged. Bowel: Known  large rectal mass with circumferential thickening in the rectum and anal region. No significant dilatation of the sigmoid colon or other visualized portions of the colon. Normal caliber of the visualized small bowel. Vascular/Lymphatic: Extensive atherosclerotic calcifications. Again noted is low-density nodule or lymph node in the along the right pelvic wall that measures 3.1 x 2.5 cm on sequence 5 image 31. Prominent inguinal lymph nodes bilaterally. Reproductive:  Prostate is poorly visualized. Other: Percutaneous drain was placed in the perirectal/perianal abscess on 02/15/2021. The large perirectal abscess has markedly decreased in size. Small residual pocket of gas and fluid just inferior to the drain on sequence 5, image 48 that measures 3.0 x 2.4 x 2.2 cm. There is new gas in the subcutaneous tissues of the medial left buttock and there is concern for an ulceration or wound in this area. Scattered gas-filled collections along the medial left buttock and perirectal region. Cannot exclude a fistula connection between the rectal mass and the subcutaneous gas-filled collections. Musculoskeletal: No acute bone abnormality. IMPRESSION: 1. Large perirectal/perianal abscess has markedly decreased in size since placement of the percutaneous drain. There are residual gas-filled collections in the soft tissues and suspect there is a fistula or sinus tract between the rectal mass and the subcutaneous tissues. 2. Soft tissue gas along the medial left buttock and concern for a cutaneous ulceration in this area. 3. Large rectal mass with evidence for a large necrotic right pelvic lymph node. Electronically Signed   By:  Markus Daft M.D.   On: 02/20/2021 15:42   MR Pelvis Wo Contrast  Result Date: 03/12/2021 CLINICAL DATA:  Newly diagnosed rectal carcinoma. EXAM: MRI PELVIS WITHOUT CONTRAST TECHNIQUE: Multiplanar multisequence MR imaging of the pelvis was performed. No intravenous contrast was administered. COMPARISON:  None. FINDINGS: TUMOR LOCATION Tumor distance from Anal Verge/Skin Surface: 1.4 cm Tumor distance from Internal Anal Sphincter: 0 cm; the tumor directly involves the upper and mid anal sphincter TUMOR DESCRIPTION Circumferential Extent: 100% Tumor Length: 11.2 cm T - CATEGORY Extension through Muscularis Propria: Yes, measuring up to 23 mm = T3d Shortest Distance from Mass to Mesorectal Fascia: 0 mm Extramural Vascular Invasion/Tumor Thrombus: No Invasion of Anterior Peritoneal Reflection: No Involvement of Adjacent Organs or Pelvic Sidewall: No Levator Ani Involvement: Yesa N - CATEGORY Mesorectal Lymph Nodes >=1m: 4 or more=N2 Extra-mesorectal Lymphadenopathy: Yes, bilateral internal iliac lymphadenopathy is seen, with largest on the right measuring 2.3 cm in short axis = N2. Shotty less than 1 cm bilateral inguinal lymph nodes are seen, which are not pathologically enlarged. Other:  None. IMPRESSION: Assuming pathologic diagnosis of rectal adenocarcinoma, local staging is T3d, N2, Mx. This tumor shows involvement of the anal sphincter. Electronically Signed   By: JMarlaine HindM.D.   On: 03/12/2021 08:39   PERIPHERAL VASCULAR CATHETERIZATION  Result Date: 02/27/2021 See surgical note for result.  NM PET Image Initial (PI) Skull Base To Thigh  Addendum Date: 02/14/2021   ADDENDUM REPORT: 02/14/2021 13:43 ADDENDUM: For clarification the comparison from May 2nd is from 2021. Nodal disease that is present on the current study and the most recent comparison is not visible on the previous more remote CT assessment. Nor is the fluid density in the area of the mesorectum that tracks above and below the pelvic  floor as described. Additionally, there is a spiculated nodule in the LEFT upper lobe with morphologic features that raise the question of small bronchogenic neoplasm measuring 9 x 8 mm (8 x 8 mm in the initial report).  Referral to multi disciplinary thoracic oncologic setting may be helpful for further assessment. In the current context could consider short interval follow-up or biopsy for further assessment as warranted. These results will be called to the ordering clinician or representative by the Radiologist Assistant, and communication documented in the PACS or [employer]. Electronically Signed   By: Zetta Bills M.D.   On: 02/14/2021 13:43   Addendum Date: 02/14/2021   ADDENDUM REPORT: 02/14/2021 13:21 ADDENDUM: These results were called to the ordering clinician or representative by the Radiologist Assistant, and communication documented in the PACS or [employer]. Electronically Signed   By: Zetta Bills M.D.   On: 02/14/2021 13:21   Result Date: 02/14/2021 CLINICAL DATA:  Initial treatment strategy for rectal cancer in a 55 year old male. Patient reports severe pain in symptoms that made the examination challenging. EXAM: NUCLEAR MEDICINE PET SKULL BASE TO THIGH TECHNIQUE: 7.97 mCi F-18 FDG was injected intravenously. Full-ring PET imaging was performed from the skull base to thigh after the radiotracer. CT data was obtained and used for attenuation correction and anatomic localization. Fasting blood glucose: 107 mg/dl COMPARISON:  CT of the abdomen and pelvis Feb 05, 2021 and CT of the abdomen and pelvis Jan 07, 2021. FINDINGS: Mediastinal blood pool activity: SUV max 2.05 Liver activity: SUV max NA NECK: Asymmetric muscular activity in the neck in prevertebral musculature. LEFT over RIGHT without visible lesion. Incidental CT findings: none CHEST: Spiculated nodule in the LEFT upper lobe measures approximately 8 x 8 mm with some surrounding spiculation (image [id number]) metabolic activity not  elevated above mediastinal blood pool. Signs of pulmonary emphysema. Airways are patent. No adenopathy in the chest or signs of suspicious nodule elsewhere. Incidental CT findings: Calcified atherosclerotic plaque of the thoracic aorta. 4 cm caliber of the ascending thoracic aorta. Minimal scattered atherosclerotic plaque with calcification. Heart size normal without pericardial effusion. Normal caliber central pulmonary vessels. Normal appearance of the esophagus. ABDOMEN/PELVIS: No signs of solid organ metastasis. Cyst in the RIGHT hepatic lobe. Marked hypermetabolic activity about the patient's known rectal mass. This mass appears less well-defined. The mesorectum is filled with low attenuation to the LEFT posterolateral aspect of the mesorectum with crescentic appearance tracking into the upper sphincter complex, horseshoe shaped low attenuation surrounds the rectum and anus as it extends towards the pelvic floor. Mass measuring approximately 6.5 x 4.9 cm (image 248/3) maximum SUV 17.6. Lenticular area of low attenuation with some peripheral increased metabolic activity, more nodular along the LEFT posterolateral margin on image 257 of series 3. This area measures approximately 2.7 cm in greatest thickness and surrounds the rectum and anus approximately 270 degrees of its circumference above the pelvic floor and at the pelvic floor slightly less. Bulky RIGHT pelvic sidewall/hypogastric lymph node outside of the mesorectum with stippled calcification measuring 19 mm and without signs of increased metabolic activity. Small LEFT hypogastric lymph node just peripheral to the internal external bifurcation on image 228 of series 3 displays low level FDG uptake with a maximum SUV of 3.2. High RIGHT internal just below or at the internal/common iliac transition, lymph node (image 214/3) 12 mm maximum SUV of 4.8. LEFT high hypogastric lymph node (image 217/3) 8 mm with low level FDG uptake, maximum SUV of 3.0. Scattered  lymph nodes throughout the retroperitoneum with FDG uptake only slightly above mediastinal blood pool. For instance on image 181 of series 3 is an 8 mm lymph node with a maximum SUV of 2.2 along the LEFT periaortic chain.  Bilateral inguinal lymph nodes largest on the RIGHT (image 248/3) 11 mm with a maximum SUV of 2.8 Primary tumor difficult to assess in terms of margins due to surrounding stranding which has worsened since previous imaging. Incidental CT findings: Tiny hypodensity in the RIGHT hemi liver (image 138/3) no focal increased FDG uptake though this 4 mm area may be below PET resolution in this context. SKELETON: Diffuse increased metabolic activity throughout the visualized skeleton without focal area of activity to suggest bony metastasis. Incidental CT findings: none IMPRESSION: Signs of locally advanced rectal cancer with ballooning of the mesorectum now with low attenuation material that was not present on previous examinations (not present on 01/30/23 and much less pronounced on February 28, 2023) with extensive stranding and inflammation. Findings suspicious for rectal compromise/focal defect in the rectum at the site of tumor or along the LEFT posterolateral aspect and contained inflammation in the mesorectum. Rectal tumor also appears bulkier in general and part of this could be due to marked worsening in short interval of rectal tumor. Large lymph node along the RIGHT pelvic sidewall and smaller lymph nodes as outlined above in the pelvis outside of the mesorectum including inguinal lymph nodes which are suspicious for disease. Note that the dominant lymph node displays little FDG uptake, potentially due to necrosis. Lymph nodes in the groin are equivocal in the setting of extensive inflammation and potentially reactive showing enlargement since previous imaging from Jan 30, 2023. No signs of solid organ metastasis. Diffuse marrow uptake likely related to symptomatic anemia. Electronically Signed: By: Zetta Bills M.D. On: 02/14/2021 08:58   CT IMAGE GUIDED DRAINAGE BY PERCUTANEOUS CATHETER  Result Date: 02/15/2021 INDICATION: 55 year old gentleman with perirectal abscess presents to IR for CT-guided drain placement EXAM: CT GUIDED DRAINAGE OF PERIRECTAL ABSCESS MEDICATIONS: The patient is currently admitted to the hospital and receiving intravenous antibiotics. The antibiotics were administered within an appropriate time frame prior to the initiation of the procedure. ANESTHESIA/SEDATION: Two mg IV Versed 100 mcg IV Fentanyl Moderate Sedation Time:  10 minutes The patient was continuously monitored during the procedure by the interventional radiology nurse under my direct supervision. COMPLICATIONS: None immediate. TECHNIQUE: Informed written consent was obtained from the patient after a thorough discussion of the procedural risks, benefits and alternatives. All questions were addressed. Maximal Sterile Barrier Technique was utilized including caps, mask, sterile gowns, sterile gloves, sterile drape, hand hygiene and skin antiseptic. A timeout was performed prior to the initiation of the procedure. PROCEDURE: Patient position prone on the procedure table. The posterior pelvic wall skin prepped and draped in usual fashion. Following local lidocaine administration, 18 gauge needle advanced into the perirectal fluid collection utilizing CT guidance. 18 gauge needle exchanged for 10 [place] multipurpose pigtail drain over 0.035 inch guidewire. 50 mL purulent sample aspirated and sent for Gram stain and culture. Drain secured to skin with suture and connected to bulb suction. Post placement CT confirmed appropriate placement of the drain. IMPRESSION: 10 [place] multipurpose pigtail drain placed in perirectal abscess. Electronically Signed   By: Miachel Roux M.D.   On: 02/15/2021 15:24        ASSESSMENT & PLAN:  1. Rectal cancer ([place])   2. Iron deficiency anemia due to chronic blood loss   3. Neoplasm related pain    4. Encounter for antineoplastic chemotherapy   Cancer Staging Rectal cancer [facility]) Staging form: Colon and Rectum, AJCC 8th Edition - Clinical stage from 02/08/2021: Stage IIIB (cT4a, cN1, cM0) - Signed by Earlie Server,  MD on 03/06/2021   #locally advanced rectal cancer. His case is complicated with a perirectal abscess. Prior to onset of perirectal abscess, on his 02/05/2021 scan, he was noted to have right internal iliac lymph node 3 x 1 x 2.3 cm which is concerning for nodal disease.On Subsequent images it is difficult to distinguish whether lymphadenopathy was due to nodal disease versus acute inflammation. 03/07/2021 MRI pelvis cT3 N2. Due to the possible contained perforation on previous CT, possible cT4 disease.   He is on TNT neoadjuvant protocol.  Labs are reviewed and discussed with patient. Proceed with cycle 4 FOLFOX Established care with [name].   #Spiculated lung nodule, likely primary lung cancer. Discussed with IR, CT guided biopsy is difficult given the position and small size. Will monitor for now. His rectal cancer treatment takes priority.   #Iron deficiency anemia Status post IV Venofer treatments, off oral iron supplementation due to constipation.  Hemoglobin improved to 11.3   #Rectal pain, continue fentanyl patch 30mg Q72 hours, refilled 10 patches. He does not need any short acting narcotics at this point.   Follow up in 2 weeks for lab md FOLFOX All questions were answered. The patient knows to call the clinic with any problems questions or concerns.    ZEarlie Server MD, PhD Hematology Oncology [facility]Mem[facility] APark[facility] Surgery Center LLCPager- [id number]/22/2022

## 2021-04-29 NOTE — Progress Notes (Addendum)
Nutrition Follow-up:  Patient with rectal cancer.  Patient on folfox.    Spoke with patient via phone.  Patient reports that his appetite is good.  Continues to tolerate and eat good sources of protein and all foods.  Drinking 2 ensure per day.  Reports drank prune juice and helped constipation.      Medications: reviewed  Labs: reviewed  Anthropometrics:   Weight 158 lb 14.4 oz today, stable   NUTRITION DIAGNOSIS: Inadequate oral intake improved   INTERVENTION:  Continue ensure shakes BID.  Continue high calorie, high protein foods for weight maintenance/gain. No questions    MONITORING, EVALUATION, GOAL: weight trends, intake   NEXT VISIT: Tuesday, Sept 6 during infusion   Oseph Imburgia B. Zenia Resides, [place], [place] Registered Dietitian [phone] (mobile)

## 2021-05-01 ENCOUNTER — Inpatient Hospital Stay: Payer: Self-pay

## 2021-05-01 VITALS — BP 120/79 | HR 106 | Temp 96.0°F | Resp 18

## 2021-05-01 DIAGNOSIS — C20 Malignant neoplasm of rectum: Secondary | ICD-10-CM

## 2021-05-01 DIAGNOSIS — D500 Iron deficiency anemia secondary to blood loss (chronic): Secondary | ICD-10-CM

## 2021-05-01 MED ORDER — HEPARIN SOD (PORK) LOCK FLUSH 100 UNIT/ML IV SOLN
INTRAVENOUS | Status: AC
  Filled 2021-05-01: qty 5

## 2021-05-01 MED ORDER — SODIUM CHLORIDE 0.9 % IV SOLN
Freq: Once | INTRAVENOUS | Status: AC
  Filled 2021-05-01: qty 250

## 2021-05-01 MED ORDER — HEPARIN SOD (PORK) LOCK FLUSH 100 UNIT/ML IV SOLN
500.0000 [IU] | Freq: Once | INTRAVENOUS | Status: AC | PRN
  Administered 2021-05-01: 500 [IU]
  Filled 2021-05-01: qty 5

## 2021-05-01 MED ORDER — IRON SUCROSE 20 MG/ML IV SOLN
200.0000 mg | Freq: Once | INTRAVENOUS | Status: AC
  Administered 2021-05-01: 200 mg via INTRAVENOUS
  Filled 2021-05-01: qty 10

## 2021-05-01 MED ORDER — SODIUM CHLORIDE 0.9% FLUSH
10.0000 mL | INTRAVENOUS | Status: DC | PRN
  Filled 2021-05-01: qty 10

## 2021-05-01 MED ORDER — SODIUM CHLORIDE 0.9 % IV SOLN: 200.0000 mg | Freq: Once | INTRAVENOUS | Status: DC

## 2021-05-01 NOTE — Patient Instructions (Addendum)

## 2021-05-02 ENCOUNTER — Encounter: Payer: Self-pay | Admitting: Oncology

## 2021-05-02 ENCOUNTER — Other Ambulatory Visit: Payer: Self-pay

## 2021-05-02 DIAGNOSIS — C20 Malignant neoplasm of rectum: Secondary | ICD-10-CM

## 2021-05-02 MED ORDER — PROCHLORPERAZINE MALEATE 10 MG PO TABS: 10.0000 mg | ORAL_TABLET | Freq: Four times a day (QID) | ORAL | 1 refills | Status: DC | PRN

## 2021-05-03 ENCOUNTER — Encounter: Payer: Self-pay | Admitting: Oncology

## 2021-05-06 ENCOUNTER — Other Ambulatory Visit: Payer: Self-pay

## 2021-05-06 ENCOUNTER — Ambulatory Visit: Payer: Self-pay

## 2021-05-06 ENCOUNTER — Ambulatory Visit: Payer: Self-pay | Admitting: Oncology

## 2021-05-08 ENCOUNTER — Other Ambulatory Visit: Payer: Self-pay | Admitting: *Deleted

## 2021-05-08 MED ORDER — FENTANYL 50 MCG/HR TD PT72: 1.0000 | MEDICATED_PATCH | TRANSDERMAL | 0 refills | Status: DC

## 2021-05-08 NOTE — Telephone Encounter (Signed)
Patient cannot afford 10 patches and requests prescription be resent for # 5 patches instead

## 2021-05-14 ENCOUNTER — Encounter: Payer: Self-pay | Admitting: Oncology

## 2021-05-14 ENCOUNTER — Inpatient Hospital Stay: Payer: Self-pay

## 2021-05-14 ENCOUNTER — Inpatient Hospital Stay: Payer: Self-pay | Attending: Oncology

## 2021-05-14 ENCOUNTER — Inpatient Hospital Stay (HOSPITAL_BASED_OUTPATIENT_CLINIC_OR_DEPARTMENT_OTHER): Payer: Self-pay | Admitting: Oncology

## 2021-05-14 VITALS — BP 126/93 | HR 99 | Temp 96.5°F | Resp 16 | Wt 158.0 lb

## 2021-05-14 DIAGNOSIS — D500 Iron deficiency anemia secondary to blood loss (chronic): Secondary | ICD-10-CM | POA: Insufficient documentation

## 2021-05-14 DIAGNOSIS — G893 Neoplasm related pain (acute) (chronic): Secondary | ICD-10-CM | POA: Insufficient documentation

## 2021-05-14 DIAGNOSIS — Z5111 Encounter for antineoplastic chemotherapy: Secondary | ICD-10-CM

## 2021-05-14 DIAGNOSIS — Z79899 Other long term (current) drug therapy: Secondary | ICD-10-CM | POA: Insufficient documentation

## 2021-05-14 DIAGNOSIS — Z87891 Personal history of nicotine dependence: Secondary | ICD-10-CM | POA: Insufficient documentation

## 2021-05-14 DIAGNOSIS — C20 Malignant neoplasm of rectum: Secondary | ICD-10-CM

## 2021-05-14 DIAGNOSIS — J439 Emphysema, unspecified: Secondary | ICD-10-CM | POA: Insufficient documentation

## 2021-05-14 DIAGNOSIS — R197 Diarrhea, unspecified: Secondary | ICD-10-CM | POA: Insufficient documentation

## 2021-05-14 DIAGNOSIS — R5382 Chronic fatigue, unspecified: Secondary | ICD-10-CM | POA: Insufficient documentation

## 2021-05-14 DIAGNOSIS — K6289 Other specified diseases of anus and rectum: Secondary | ICD-10-CM | POA: Insufficient documentation

## 2021-05-14 DIAGNOSIS — K5900 Constipation, unspecified: Secondary | ICD-10-CM | POA: Insufficient documentation

## 2021-05-14 DIAGNOSIS — Z833 Family history of diabetes mellitus: Secondary | ICD-10-CM | POA: Insufficient documentation

## 2021-05-14 DIAGNOSIS — R58 Hemorrhage, not elsewhere classified: Secondary | ICD-10-CM | POA: Insufficient documentation

## 2021-05-14 DIAGNOSIS — Z809 Family history of malignant neoplasm, unspecified: Secondary | ICD-10-CM | POA: Insufficient documentation

## 2021-05-14 LAB — COMPREHENSIVE METABOLIC PANEL
ALT: 39 U/L (ref 0–44)
AST: 33 U/L (ref 15–41)
Albumin: 3.4 g/dL — ABNORMAL LOW (ref 3.5–5.0)
Alkaline Phosphatase: 79 U/L (ref 38–126)
Anion gap: 7 (ref 5–15)
BUN: 8 mg/dL (ref 6–20)
CO2: 26 mmol/L (ref 22–32)
Calcium: 8.9 mg/dL (ref 8.9–10.3)
Chloride: 101 mmol/L (ref 98–111)
Creatinine, Ser: 0.91 mg/dL (ref 0.61–1.24)
GFR, Estimated: >60 mL/min (ref >60)
Glucose, Bld: 99 mg/dL (ref 70–99)
Potassium: 4.1 mmol/L (ref 3.5–5.1)
Sodium: 134 mmol/L — ABNORMAL LOW (ref 135–145)
Total Bilirubin: 0.5 mg/dL (ref 0.3–1.2)
Total Protein: 7.8 g/dL (ref 6.5–8.1)

## 2021-05-14 LAB — CBC WITH DIFFERENTIAL/PLATELET
Abs Immature Granulocytes: 0 10*3/uL (ref 0.00–0.07)
Basophils Absolute: 0 10*3/uL (ref 0.0–0.1)
Basophils Relative: 1 %
Eosinophils Absolute: 0.3 10*3/uL (ref 0.0–0.5)
Eosinophils Relative: 7 %
HCT: 36.5 % — ABNORMAL LOW (ref 39.0–52.0)
Hemoglobin: 12 g/dL — ABNORMAL LOW (ref 13.0–17.0)
Immature Granulocytes: 0 %
Lymphocytes Relative: 35 %
Lymphs Abs: 1.6 10*3/uL (ref 0.7–4.0)
MCH: 27.6 pg (ref 26.0–34.0)
MCHC: 32.9 g/dL (ref 30.0–36.0)
MCV: 84.1 fL (ref 80.0–100.0)
Monocytes Absolute: 1 10*3/uL (ref 0.1–1.0)
Monocytes Relative: 22 %
Neutro Abs: 1.6 10*3/uL — ABNORMAL LOW (ref 1.7–7.7)
Neutrophils Relative %: 35 %
Platelets: 152 10*3/uL (ref 150–400)
RBC: 4.34 MIL/uL (ref 4.22–5.81)
RDW: 20.1 % — ABNORMAL HIGH (ref 11.5–15.5)
Smear Review: NORMAL
WBC: 4.5 10*3/uL (ref 4.0–10.5)
nRBC: 0 % (ref 0.0–0.2)

## 2021-05-14 MED ORDER — SODIUM CHLORIDE 0.9 % IV SOLN
10.0000 mg | Freq: Once | INTRAVENOUS | Status: AC
  Administered 2021-05-14: 10 mg via INTRAVENOUS
  Filled 2021-05-14: qty 10

## 2021-05-14 MED ORDER — LEUCOVORIN CALCIUM INJECTION 350 MG
412.0000 mg/m2 | Freq: Once | INTRAVENOUS | Status: AC
  Administered 2021-05-14: 750 mg via INTRAVENOUS
  Filled 2021-05-14: qty 37.5

## 2021-05-14 MED ORDER — OXALIPLATIN CHEMO INJECTION 100 MG/20ML
82.0000 mg/m2 | Freq: Once | INTRAVENOUS | Status: AC
  Administered 2021-05-14: 150 mg via INTRAVENOUS
  Filled 2021-05-14: qty 10

## 2021-05-14 MED ORDER — DEXTROSE 5 % IV SOLN
Freq: Once | INTRAVENOUS | Status: AC
  Filled 2021-05-14: qty 250

## 2021-05-14 MED ORDER — SODIUM CHLORIDE 0.9% FLUSH
10.0000 mL | Freq: Once | INTRAVENOUS | Status: AC
  Administered 2021-05-14: 10 mL via INTRAVENOUS
  Filled 2021-05-14: qty 10

## 2021-05-14 MED ORDER — PALONOSETRON HCL INJECTION 0.25 MG/5ML
0.2500 mg | Freq: Once | INTRAVENOUS | Status: AC
  Administered 2021-05-14: 0.25 mg via INTRAVENOUS
  Filled 2021-05-14: qty 5

## 2021-05-14 MED ORDER — FLUOROURACIL CHEMO INJECTION 2.5 GM/50ML
400.0000 mg/m2 | Freq: Once | INTRAVENOUS | Status: AC
  Administered 2021-05-14: 750 mg via INTRAVENOUS
  Filled 2021-05-14: qty 15

## 2021-05-14 MED ORDER — SODIUM CHLORIDE 0.9 % IV SOLN
2400.0000 mg/m2 | INTRAVENOUS | Status: DC
  Administered 2021-05-14: 4350 mg via INTRAVENOUS
  Filled 2021-05-14: qty 87

## 2021-05-14 NOTE — Patient Instructions (Signed)
[facility]  Discharge Instructions: Thank you for choosing [facility] to provide your oncology and hematology care.  If you have a lab appointment with the Cancer Center, please go directly to the Cancer Center and check in at the registration area.  Wear comfortable clothing and clothing appropriate for easy access to any Portacath or PICC line.   We strive to give you quality time with your provider. You may need to reschedule your appointment if you arrive late (15 or more minutes).  Arriving late affects you and other patients whose appointments are after yours.  Also, if you miss three or more appointments without notifying the office, you may be dismissed from the clinic at the provider's discretion.      For prescription refill requests, have your pharmacy contact our office and allow 72 hours for refills to be completed.    Today you received the following chemotherapy and/or immunotherapy agents: FOLFOX, The chemotherapy medication bag should finish at 46 hours, 96 hours, or 7 days. For example, if your pump is scheduled for 46 hours and it was put on at 4:00 p.m., it should finish at 2:00 p.m. the day it is scheduled to come off regardless of your appointment time.     Estimated time to finish at 1130am.   If the display on your pump reads "Low Volume" and it is beeping, take the batteries out of the pump and come to the cancer center for it to be taken off.   If the pump alarms go off prior to the pump reading "Low Volume" then call [phone] and someone can assist you.  If the plunger comes out and the chemotherapy medication is leaking out, please use your home chemo spill kit to clean up the spill. Do NOT use paper towels or other household products.  If you have problems or questions regarding your pump, please call either [phone] (24 hours a day) or the cancer center Monday-Friday 8:00 a.m.- 4:30 p.m. at the clinic  number and we will assist you. If you are unable to get assistance, then go to the nearest Emergency Department and ask the staff to contact the IV team for assistance.        To help prevent nausea and vomiting after your treatment, we encourage you to take your nausea medication as directed.  BELOW ARE SYMPTOMS THAT SHOULD BE REPORTED IMMEDIATELY: *FEVER GREATER THAN 100.4 F (38 C) OR HIGHER *CHILLS OR SWEATING *NAUSEA AND VOMITING THAT IS NOT CONTROLLED WITH YOUR NAUSEA MEDICATION *UNUSUAL SHORTNESS OF BREATH *UNUSUAL BRUISING OR BLEEDING *URINARY PROBLEMS (pain or burning when urinating, or frequent urination) *BOWEL PROBLEMS (unusual diarrhea, constipation, pain near the anus) TENDERNESS IN MOUTH AND THROAT WITH OR WITHOUT PRESENCE OF ULCERS (sore throat, sores in mouth, or a toothache) UNUSUAL RASH, SWELLING OR PAIN  UNUSUAL VAGINAL DISCHARGE OR ITCHING   Items with * indicate a potential emergency and should be followed up as soon as possible or go to the Emergency Department if any problems should occur.  Please show the CHEMOTHERAPY ALERT CARD or IMMUNOTHERAPY ALERT CARD at check-in to the Emergency Department and triage nurse.  Should you have questions after your visit or need to cancel or reschedule your appointment, please contact [facility]  [phone] and follow the prompts.  Office hours are 8:00 a.m. to 4:30 p.m. Monday - Friday. Please note that voicemails left after 4:00 p.m. may not be returned until the   following business day.  We are closed weekends and major holidays. You have access to a nurse at all times for urgent questions. Please call the main number to the clinic [phone] and follow the prompts.  For any non-urgent questions, you may also contact your provider using MyChart. We now offer e-Visits for anyone 53 and older to request care online for non-urgent symptoms. For details visit [email or web address].   Also  download the MyChart app! Go to the app store, search "MyChart", open the app, select [place], and log in with your MyChart username and password.  Due to Covid, a mask is required upon entering the hospital/clinic. If you do not have a mask, one will be given to you upon arrival. For doctor visits, patients may have 1 support person aged 53 or older with them. For treatment visits, patients cannot have anyone with them due to current Covid guidelines and our immunocompromised population. Fluorouracil, 5-FU injection What is this medication? FLUOROURACIL, 5-FU (flure oh YOOR a sil) is a chemotherapy drug. It slows the growth of cancer cells. This medicine is used to treat many types of cancer like breast cancer, colon or rectal cancer, pancreatic cancer, and stomach cancer. This medicine may be used for other purposes; ask your health care provider or pharmacist if you have questions. COMMON BRAND NAME(S): Adrucil What should I tell my care team before I take this medication? They need to know if you have any of these conditions: blood disorders dihydropyrimidine dehydrogenase (DPD) deficiency infection (especially a virus infection such as chickenpox, cold sores, or herpes) kidney disease liver disease malnourished, poor nutrition recent or ongoing radiation therapy an unusual or allergic reaction to fluorouracil, other chemotherapy, other medicines, foods, dyes, or preservatives pregnant or trying to get pregnant breast-feeding How should I use this medication? This drug is given as an infusion or injection into a vein. It is administered in a hospital or clinic by a specially trained health care professional. Talk to your pediatrician regarding the use of this medicine in children. Special care may be needed. Overdosage: If you think you have taken too much of this medicine contact a poison control center or emergency room at once. NOTE: This medicine is only for you. Do not share this  medicine with others. What if I miss a dose? It is important not to miss your dose. Call your doctor or health care professional if you are unable to keep an appointment. What may interact with this medication? Do not take this medicine with any of the following medications: live virus vaccines This medicine may also interact with the following medications: medicines that treat or prevent blood clots like warfarin, enoxaparin, and dalteparin This list may not describe all possible interactions. Give your health care provider a list of all the medicines, herbs, non-prescription drugs, or dietary supplements you use. Also tell them if you smoke, drink alcohol, or use illegal drugs. Some items may interact with your medicine. What should I watch for while using this medication? Visit your doctor for checks on your progress. This drug may make you feel generally unwell. This is not uncommon, as chemotherapy can affect healthy cells as well as cancer cells. Report any side effects. Continue your course of treatment even though you feel ill unless your doctor tells you to stop. In some cases, you may be given additional medicines to help with side effects. Follow all directions for their use. Call your doctor or health care professional for advice if you get  a fever, chills or sore throat, or other symptoms of a cold or flu. Do not treat yourself. This drug decreases your body's ability to fight infections. Try to avoid being around people who are sick. This medicine may increase your risk to bruise or bleed. Call your doctor or health care professional if you notice any unusual bleeding. Be careful brushing and flossing your teeth or using a toothpick because you may get an infection or bleed more easily. If you have any dental work done, tell your dentist you are receiving this medicine. Avoid taking products that contain aspirin, acetaminophen, ibuprofen, naproxen, or ketoprofen unless instructed by your  doctor. These medicines may hide a fever. Do not become pregnant while taking this medicine. Women should inform their doctor if they wish to become pregnant or think they might be pregnant. There is a potential for serious side effects to an unborn child. Talk to your health care professional or pharmacist for more information. Do not breast-feed an infant while taking this medicine. Men should inform their doctor if they wish to father a child. This medicine may lower sperm counts. Do not treat diarrhea with over the counter products. Contact your doctor if you have diarrhea that lasts more than 2 days or if it is severe and watery. This medicine can make you more sensitive to the sun. Keep out of the sun. If you cannot avoid being in the sun, wear protective clothing and use sunscreen. Do not use sun lamps or tanning beds/booths. What side effects may I notice from receiving this medication? Side effects that you should report to your doctor or health care professional as soon as possible: allergic reactions like skin rash, itching or hives, swelling of the face, lips, or tongue low blood counts - this medicine may decrease the number of white blood cells, red blood cells and platelets. You may be at increased risk for infections and bleeding. signs of infection - fever or chills, cough, sore throat, pain or difficulty passing urine signs of decreased platelets or bleeding - bruising, pinpoint red spots on the skin, black, tarry stools, blood in the urine signs of decreased red blood cells - unusually weak or tired, fainting spells, lightheadedness breathing problems changes in vision chest pain mouth sores nausea and vomiting pain, swelling, redness at site where injected pain, tingling, numbness in the hands or feet redness, swelling, or sores on hands or feet stomach pain unusual bleeding Side effects that usually do not require medical attention (report to your doctor or health care  professional if they continue or are bothersome): changes in finger or toe nails diarrhea dry or itchy skin hair loss headache loss of appetite sensitivity of eyes to the light stomach upset unusually teary eyes This list may not describe all possible side effects. Call your doctor for medical advice about side effects. You may report side effects to FDA at 1-800-FDA-1088. Where should I keep my medication? This drug is given in a hospital or clinic and will not be stored at home. NOTE: This sheet is a summary. It may not cover all possible information. If you have questions about this medicine, talk to your doctor, pharmacist, or health care provider.  2022 [employer]/Gold Standard (2019-07-26 15:00:03) Leucovorin injection What is this medication? LEUCOVORIN (loo koe VOR in) is used to prevent or treat the harmful effects of some medicines. This medicine is used to treat anemia caused by a low amount of folic acid in the body. It is also used  with 5-fluorouracil (5-FU) to treat colon cancer. This medicine may be used for other purposes; ask your health care provider or pharmacist if you have questions. What should I tell my care team before I take this medication? They need to know if you have any of these conditions: anemia from low levels of vitamin B-12 in the blood an unusual or allergic reaction to leucovorin, folic acid, other medicines, foods, dyes, or preservatives pregnant or trying to get pregnant breast-feeding How should I use this medication? This medicine is for injection into a muscle or into a vein. It is given by a health care professional in a hospital or clinic setting. Talk to your pediatrician regarding the use of this medicine in children. Special care may be needed. Overdosage: If you think you have taken too much of this medicine contact a poison control center or emergency room at once. NOTE: This medicine is only for you. Do not share this medicine with  others. What if I miss a dose? This does not apply. What may interact with this medication? capecitabine fluorouracil phenobarbital phenytoin primidone trimethoprim-sulfamethoxazole This list may not describe all possible interactions. Give your health care provider a list of all the medicines, herbs, non-prescription drugs, or dietary supplements you use. Also tell them if you smoke, drink alcohol, or use illegal drugs. Some items may interact with your medicine. What should I watch for while using this medication? Your condition will be monitored carefully while you are receiving this medicine. This medicine may increase the side effects of 5-fluorouracil, 5-FU. Tell your doctor or health care professional if you have diarrhea or mouth sores that do not get better or that get worse. What side effects may I notice from receiving this medication? Side effects that you should report to your doctor or health care professional as soon as possible: allergic reactions like skin rash, itching or hives, swelling of the face, lips, or tongue breathing problems fever, infection mouth sores unusual bleeding or bruising unusually weak or tired Side effects that usually do not require medical attention (report to your doctor or health care professional if they continue or are bothersome): constipation or diarrhea loss of appetite nausea, vomiting This list may not describe all possible side effects. Call your doctor for medical advice about side effects. You may report side effects to FDA at 1-800-FDA-1088. Where should I keep my medication? This drug is given in a hospital or clinic and will not be stored at home. NOTE: This sheet is a summary. It may not cover all possible information. If you have questions about this medicine, talk to your doctor, pharmacist, or health care provider.  2022 Elsevier/Gold Standard (2008-02-29 16:50:29) Oxaliplatin Injection What is this medication? OXALIPLATIN  (ox AL i PLA tin) is a chemotherapy drug. It targets fast dividing cells, like cancer cells, and causes these cells to die. This medicine is used to treat cancers of the colon and rectum, and many other cancers. This medicine may be used for other purposes; ask your health care provider or pharmacist if you have questions. COMMON BRAND NAME(S): Eloxatin What should I tell my care team before I take this medication? They need to know if you have any of these conditions: heart disease history of irregular heartbeat liver disease low blood counts, like white cells, platelets, or red blood cells lung or breathing disease, like asthma take medicines that treat or prevent blood clots tingling of the fingers or toes, or other nerve disorder an unusual or allergic  reaction to oxaliplatin, other chemotherapy, other medicines, foods, dyes, or preservatives pregnant or trying to get pregnant breast-feeding How should I use this medication? This drug is given as an infusion into a vein. It is administered in a hospital or clinic by a specially trained health care professional. Talk to your pediatrician regarding the use of this medicine in children. Special care may be needed. Overdosage: If you think you have taken too much of this medicine contact a poison control center or emergency room at once. NOTE: This medicine is only for you. Do not share this medicine with others. What if I miss a dose? It is important not to miss a dose. Call your doctor or health care professional if you are unable to keep an appointment. What may interact with this medication? Do not take this medicine with any of the following medications: cisapride dronedarone pimozide thioridazine This medicine may also interact with the following medications: aspirin and aspirin-like medicines certain medicines that treat or prevent blood clots like warfarin, apixaban, dabigatran, and  rivaroxaban cisplatin cyclosporine diuretics medicines for infection like acyclovir, adefovir, amphotericin B, bacitracin, cidofovir, foscarnet, ganciclovir, gentamicin, pentamidine, vancomycin NSAIDs, medicines for pain and inflammation, like ibuprofen or naproxen other medicines that prolong the QT interval (an abnormal heart rhythm) pamidronate zoledronic acid This list may not describe all possible interactions. Give your health care provider a list of all the medicines, herbs, non-prescription drugs, or dietary supplements you use. Also tell them if you smoke, drink alcohol, or use illegal drugs. Some items may interact with your medicine. What should I watch for while using this medication? Your condition will be monitored carefully while you are receiving this medicine. You may need blood work done while you are taking this medicine. This medicine may make you feel generally unwell. This is not uncommon as chemotherapy can affect healthy cells as well as cancer cells. Report any side effects. Continue your course of treatment even though you feel ill unless your healthcare professional tells you to stop. This medicine can make you more sensitive to cold. Do not drink cold drinks or use ice. Cover exposed skin before coming in contact with cold temperatures or cold objects. When out in cold weather wear warm clothing and cover your mouth and nose to warm the air that goes into your lungs. Tell your doctor if you get sensitive to the cold. Do not become pregnant while taking this medicine or for 9 months after stopping it. Women should inform their health care professional if they wish to become pregnant or think they might be pregnant. Men should not father a child while taking this medicine and for 6 months after stopping it. There is potential for serious side effects to an unborn child. Talk to your health care professional for more information. Do not breast-feed a child while taking this  medicine or for 3 months after stopping it. This medicine has caused ovarian failure in some women. This medicine may make it more difficult to get pregnant. Talk to your health care professional if you are concerned about your fertility. This medicine has caused decreased sperm counts in some men. This may make it more difficult to father a child. Talk to your health care professional if you are concerned about your fertility. This medicine may increase your risk of getting an infection. Call your health care professional for advice if you get a fever, chills, or sore throat, or other symptoms of a cold or flu. Do not treat yourself.  Try to avoid being around people who are sick. Avoid taking medicines that contain aspirin, acetaminophen, ibuprofen, naproxen, or ketoprofen unless instructed by your health care professional. These medicines may hide a fever. Be careful brushing or flossing your teeth or using a toothpick because you may get an infection or bleed more easily. If you have any dental work done, tell your dentist you are receiving this medicine. What side effects may I notice from receiving this medication? Side effects that you should report to your doctor or health care professional as soon as possible: allergic reactions like skin rash, itching or hives, swelling of the face, lips, or tongue breathing problems cough low blood counts - this medicine may decrease the number of white blood cells, red blood cells, and platelets. You may be at increased risk for infections and bleeding nausea, vomiting pain, redness, or irritation at site where injected pain, tingling, numbness in the hands or feet signs and symptoms of bleeding such as bloody or black, tarry stools; red or dark brown urine; spitting up blood or brown material that looks like coffee grounds; red spots on the skin; unusual bruising or bleeding from the eyes, gums, or nose signs and symptoms of a dangerous change in  heartbeat or heart rhythm like chest pain; dizziness; fast, irregular heartbeat; palpitations; feeling faint or lightheaded; falls signs and symptoms of infection like fever; chills; cough; sore throat; pain or trouble passing urine signs and symptoms of liver injury like dark yellow or brown urine; general ill feeling or flu-like symptoms; light-colored stools; loss of appetite; nausea; right upper belly pain; unusually weak or tired; yellowing of the eyes or skin signs and symptoms of low red blood cells or anemia such as unusually weak or tired; feeling faint or lightheaded; falls signs and symptoms of muscle injury like dark urine; trouble passing urine or change in the amount of urine; unusually weak or tired; muscle pain; back pain Side effects that usually do not require medical attention (report to your doctor or health care professional if they continue or are bothersome): changes in taste diarrhea gas hair loss loss of appetite mouth sores This list may not describe all possible side effects. Call your doctor for medical advice about side effects. You may report side effects to FDA at 1-800-FDA-1088. Where should I keep my medication? This drug is given in a hospital or clinic and will not be stored at home. NOTE: This sheet is a summary. It may not cover all possible information. If you have questions about this medicine, talk to your doctor, pharmacist, or health care provider.  2022 Elsevier/Gold Standard (2019-01-12 12:20:35)

## 2021-05-14 NOTE — Progress Notes (Signed)
Hematology/Oncology follow up note [facility] [phone]) [phone] [phone]) [phone]   Patient Care Team: Pcp, No as PCP - General [name], RN as Oncology Nurse Navigator  REFERRING PROVIDER: Lorella Nimrod, MD  CHIEF COMPLAINTS/REASON FOR VISIT:  Follow up for rectal cancer treatment  HISTORY OF PRESENTING ILLNESS:   Barry Klomp. is a  55 y.o.  male with PMH listed below was seen in consultation at the request of  Lorella Nimrod, MD  for evaluation of rectal cancer  02/05/2021-02/06/2021 patient was hospitalized due to generalized weakness, intermittent lightheadedness, weight loss and worsening constipation.  02/05/2021 CT abdomen showed concerning of severe rectal wall thickening and right internal iliac lymph node concerning for metastatic disease.  Patient was seen by gastroenterology and had colonoscopy which showed a circumferential fungating mass in the rectum.  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  02/14/2021-02/16/2021 hospitalized due to rectal bleeding and pain.   02/14/2021 CT showed perirectal fluid collection/gas concerning for infection with significant leukocytosis, anemia with hemoglobin of 6.8.  Patient received PRBC transfusion, IV antibiotics.  He underwent an IR guided placement of JP drain into the rectal abscess.  Discharged home with oral Augmentin. 02/20/2021 presented to ER with new skin opening and draining from left gluteus.  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  JP drain has been removed.  Patient was continued on Augmentin.  02/13/2021, PET scan showed locally advanced rectal cancer with billowing of the mesorectum now with low attenuation material that was not present on previous examination with extensive stranding and inflammation.   Bulky RIGHT pelvic sidewall/hypogastric lymph node outside of the mesorectum with stippled calcification measuring 19 mm-no increased metabolic  activity. Small LEFT hypogastric lymph node just peripheral to the internal external bifurcation-SUV 3.2 High RIGHT internal just below or at the internal/common iliac transition, lymph node -SUV 4.8 LEFT high hypogastric lymph node  8 mm-SUV 3. Scattered lymph nodes throughout the retroperitoneum with low FDG uptake Bilateral inguinal lymph nodes largest on the RIGHT (image 248/3) 11 mm with a maximum SUV of 2.8  spiculated nodule in the LEFT upper lobe- 9 x 8 mm  02/14/2021, CT abdomen pelvis without contrast Showed perforated rectal mass with contained perforation with fluid and gas extending above and below the pelvic floor,potentially involving the sphincter complex and extending into LEFT ischial rectal fossa  02/20/2021, CT pelvis with contrast showed Large perirectal/perianal abscess has markedly decreased in size since placement of the percutaneous drain. There are residual gas-filled collections in the soft tissues and suspect there is a fistula or sinus tract between the rectal mass and the subcutaneous tissues. Soft tissue gas along the medial left buttock and concern for a cutaneous ulceration in this area. Large rectal mass with evidence for a large necrotic right pelvic lymph node.   02/27/2021 medi port placed by Dr.Dew 03/13/2021 reports right butt cheek and also left perianal area fullness.he was seen by Dr.Pabon urgently  and had right buttock abscess drained. Left perianal fullness was felt to be due to cancer.   03/13/2021 started [place]  # He was seen by [name] Dr.Mantyh who agrees with TNT followed by surgery.  INTERVAL HISTORY Airon Shifflett. is a 55 y.o. male who has above history reviewed by me today presents for follow up visit for management of rectal cancer. Problems and complaints are listed below:  .  # Constipation improved. He took Laxative and has had diarrhea episodes.  No new complaints. Chronic fatigue.  Appetite is good. No blood in the stool.  He is on  fentanyl patch and pain is controlled.  .    Review of Systems  Constitutional:  Negative for appetite change, chills, fatigue, fever and unexpected weight change.  HENT:   Negative for hearing loss and voice change.   Eyes:  Negative for eye problems and icterus.  Respiratory:  Negative for chest tightness, cough and shortness of breath.   Cardiovascular:  Negative for chest pain and leg swelling.  Gastrointestinal:  Positive for rectal pain. Negative for abdominal distention, abdominal pain, blood in stool and constipation.  Endocrine: Negative for hot flashes.  Genitourinary:  Negative for difficulty urinating, dysuria and frequency.   Musculoskeletal:  Negative for arthralgias.  Skin:  Negative for itching and rash.  Neurological:  Negative for light-headedness and numbness.  Hematological:  Negative for adenopathy. Does not bruise/bleed easily.  Psychiatric/Behavioral:  Negative for confusion.    MEDICAL HISTORY:  Past Medical History:  Diagnosis Date   Headache    Left shoulder pain    Rectal cancer ([place])     SURGICAL HISTORY: Past Surgical History:  Procedure Laterality Date   COLONOSCOPY WITH PROPOFOL N/A 02/06/2021   Procedure: COLONOSCOPY WITH PROPOFOL;  Surgeon: Lin Landsman, MD;  Location: [facility] ENDOSCOPY;  Service: Gastroenterology;  Laterality: N/A;   PORTA CATH INSERTION N/A 02/27/2021   Procedure: PORTA CATH INSERTION;  Surgeon: Algernon Huxley, MD;  Location: [facility];  Service: Cardiovascular;  Laterality: N/A;   ROTATOR CUFF REPAIR Left     SOCIAL HISTORY: Social History   Socioeconomic History   Marital status: Married    Spouse name: Not on file   Number of children: Not on file   Years of education: Not on file   Highest education level: Not on file  Occupational History   Not on file  Tobacco Use   Smoking status: Former    Packs/day: 1.00    Years: 10.00    Pack years: 10.00    Types: Cigars, Cigarettes    Quit date: 02/04/2021     Years since quitting: 0.2   Smokeless tobacco: Never  Substance and Sexual Activity   Alcohol use: Not Currently   Drug use: No   Sexual activity: Not on file  Other Topics Concern   Not on file  Social History Narrative   Not on file   Social Determinants of Health   Financial Resource Strain: Not on file  Food Insecurity: Not on file  Transportation Needs: Not on file  Physical Activity: Not on file  Stress: Not on file  Social Connections: Not on file  Intimate Partner Violence: Not on file    FAMILY HISTORY: Family History  Problem Relation Age of Onset   Cancer Sister    Diabetes Mother    Cancer Maternal Grandmother    Cancer Paternal Grandmother     ALLERGIES:  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  MEDICATIONS:  Current Outpatient Medications  Medication Sig Dispense Refill   feeding supplement (ENSURE ENLIVE / ENSURE PLUS) LIQD Take 237 mLs by mouth daily. 237 mL 12   fentaNYL (DURAGESIC) 50 MCG/HR Place 1 patch onto the skin every 3 (three) days. 5 patch 0   ondansetron (ZOFRAN) 8 MG tablet Take 1 tablet (8 mg total) by mouth 2 (two) times daily as needed for refractory nausea / vomiting. Start on day 3 after chemotherapy. 30 tablet 1   prochlorperazine (COMPAZINE) 10 MG tablet Take 1 tablet (  10 mg total) by mouth every 6 (six) hours as needed (Nausea or vomiting). 30 tablet 1   senna-docusate (SENOKOT-S) 8.6-50 MG tablet Take 2 tablets by mouth daily. 120 tablet 0   HYDROcodone-acetaminophen (NORCO) 5-325 MG tablet Take 1 tablet by mouth every 6 (six) hours as needed for moderate pain. (Patient not taking: No sig reported) 90 tablet 0   ibuprofen (ADVIL) 400 MG tablet Take 1 tablet (400 mg total) by mouth every 6 (six) hours as needed. (Patient not taking: No sig reported) 30 tablet 0   lidocaine-prilocaine (EMLA) cream Apply to affected area once (Patient not taking: No sig reported) 30 g 3   nicotine (NICODERM CQ - DOSED IN MG/24 HOURS) 21 mg/24hr patch Place  1 patch (21 mg total) onto the skin daily. (Patient not taking: No sig reported) 28 patch 0   No current facility-administered medications for this visit.   Facility-Administered Medications Ordered in Other Visits  Medication Dose Route Frequency Provider Last Rate Last Admin   dexamethasone (DECADRON) 10 mg in sodium chloride 0.9 % 50 mL IVPB  10 mg Intravenous Once Earlie Server, MD 204 mL/hr at 05/14/21 1048 10 mg at 05/14/21 1048   fluorouracil (ADRUCIL) 4,350 mg in sodium chloride 0.9 % 63 mL chemo infusion  2,400 mg/m2 (Treatment Plan Recorded) Intravenous 1 day or 1 dose Earlie Server, MD       fluorouracil (ADRUCIL) chemo injection 750 mg  400 mg/m2 (Treatment Plan Recorded) Intravenous Once Earlie Server, MD       leucovorin 750 mg in dextrose 5 % 250 mL infusion  412 mg/m2 (Treatment Plan Recorded) Intravenous Once Earlie Server, MD       oxaliplatin (ELOXATIN) 150 mg in dextrose 5 % 500 mL chemo infusion  82 mg/m2 (Treatment Plan Recorded) Intravenous Once Earlie Server, MD         PHYSICAL EXAMINATION: ECOG PERFORMANCE STATUS: 1 - Symptomatic but completely ambulatory Vitals:   05/14/21 0927  BP: (!) 126/93  Pulse: 99  Resp: 16  Temp: (!) 96.5 F (35.8 C)  SpO2: 100%     Filed Weights   05/14/21 0927  Weight: 158 lb (71.7 kg)     Physical Exam Constitutional:      Comments: he ambulates independently.  HENT:     Head: Normocephalic and atraumatic.  Eyes:     General: No scleral icterus. Cardiovascular:     Rate and Rhythm: Normal rate and regular rhythm.     Heart sounds: Normal heart sounds.  Pulmonary:     Effort: Pulmonary effort is normal. No respiratory distress.     Breath sounds: No wheezing.     Comments: Decreased breath sound bilaterally.  Abdominal:     General: Bowel sounds are normal. There is no distension.     Palpations: Abdomen is soft.  Musculoskeletal:        General: No deformity. Normal range of motion.     Cervical back: Normal range of motion and neck  supple.  Skin:    General: Skin is warm and dry.     Findings: No erythema or rash.  Neurological:     Mental Status: He is alert and oriented to person, place, and time. Mental status is at baseline.     Cranial Nerves: No cranial nerve deficit.     Coordination: Coordination normal.  Psychiatric:        Mood and Affect: Mood normal.    LABORATORY DATA:  I have reviewed the data  as listed Lab Results  Component Value Date   WBC 4.5 05/14/2021   HGB 12.0 (L) 05/14/2021   HCT 36.5 (L) 05/14/2021   MCV 84.1 05/14/2021   PLT 152 05/14/2021   Recent Labs    04/15/21 0829 04/29/21 0820 05/14/21 0854  NA 134* 135 134*  K 3.5 4.2 4.1  CL 100 101 101  CO2 '27 30 26  '$ GLUCOSE 135* 135* 99  BUN '9 10 8  '$ CREATININE 0.83 0.91 0.91  CALCIUM 8.5* 8.7* 8.9  GFRNONAA >60 >60 >60  PROT 7.7 7.8 7.8  ALBUMIN 3.2* 3.3* 3.4*  AST 16 54* 33  ALT 14 60* 39  ALKPHOS 81 98 79  BILITOT 0.5 0.6 0.5    Iron/TIBC/Ferritin/ %Sat    Component Value Date/Time   IRON 28 (L) 03/25/2021 1029   TIBC 356 03/25/2021 1029   FERRITIN 118 03/25/2021 1029   IRONPCTSAT 8 (L) 03/25/2021 1029      RADIOGRAPHIC STUDIES: I have personally reviewed the radiological images as listed and agreed with the findings in the report. CT ABDOMEN PELVIS WO CONTRAST  Addendum Date: 02/15/2021   ADDENDUM REPORT: 02/15/2021 10:27 ADDENDUM: Signs of potential fishbone in the colon well upstream from the rectal mass. Suggest attention on follow-up as these can occasionally lead to bowel perforation, this is an unrelated finding and not currently associated with inflammation. (Image 82/6 and image 25/5) These results will be called to the ordering clinician or representative by the Radiologist Assistant, and communication documented in the PACS or [employer]. Electronically Signed   By: Zetta Bills M.D.   On: 02/15/2021 10:27   Result Date: 02/15/2021 CLINICAL DATA:  New rectal cancer with severe pain. EXAM: CT  ABDOMEN AND PELVIS WITHOUT CONTRAST TECHNIQUE: Multidetector CT imaging of the abdomen and pelvis was performed following the standard protocol without IV contrast. COMPARISON:  PET CT that was performed on February 13, 2021. FINDINGS: Lower chest: Incidental imaging of the lung bases is unremarkable. Hepatobiliary: Small low-density lesion in the RIGHT hepatic lobe (image 9/2 6 mm, likely a small cyst. Small cyst along the RIGHT hepatic margin. No pericholecystic stranding. No gross biliary duct distension. Pancreas: Pancreas with mild atrophy. No sign of adjacent inflammation. Spleen: Spleen normal size and contour. Adrenals/Urinary Tract: Adrenal glands are normal. Symmetric smooth contour of bilateral kidneys. No hydronephrosis. No nephrolithiasis. Urinary bladder with smooth contours but extending into the lower abdomen with moderate to marked distension. Stomach/Bowel: Stomach without evidence of adjacent stranding. No small bowel dilation. Normal appendix. Colon is stool filled without signs of obstruction. At the descending/sigmoid junction there is a linear area of increased calcific density within the fecal stream potentially a small fishbone. No adjacent stranding or free air. Distal to this is the large irregular colonic tumor with increasing size of a horseshoe shaped area of low attenuation about the rectum. Small fluid collection lateral to the pubic 0 wreck talus muscle on image 82 of series 2 contains gas and has developed in the short interval since the study of Feb 05, 2021, quite subtle on the PET scan of only 1 day ago now containing gas, not containing gas on the recent PET exam. Small amount a of gas also seen in the fluid density that tracks into the perianal region, potentially within the sphincter complex in the intersphincteric plane though not clearly delineated on the current study bulging normal anatomy along the region of the sphincter complex. Ulcerated and irregular circumferential mass.  Fluid begins above  the pelvic floor and tracks below the pelvic floor along with LEFT ischial rectal fossa collection described above. Associated nodal enlargement in the RIGHT hemipelvis as as outlined on the previous PET exam along with hypogastric lymph nodes and extensive mesorectal soft tissue. Inguinal lymph nodes with enlargement, potentially reactive given above findings. No free intraperitoneal air. Vascular/Lymphatic: No retroperitoneal adenopathy. Calcified atheromatous plaque of the abdominal aorta. No aneurysmal dilation. Reproductive: Inseparable from the process in the mesorectum. Other: No free air.  No ascites. Musculoskeletal: No acute bone finding or destructive bone process. Spinal degenerative changes. IMPRESSION: Signs of presumed perforated rectal mass with contained perforation with fluid and gas extending above and below the pelvic floor, potentially involving the sphincter complex and extending into LEFT ischial rectal fossa. GI and or surgical consultation may be helpful for further evaluation. Fluid and gas has developed since the Feb 06, 2020 evaluation where there was only minimal low-attenuation outside of the rectum. Signs of nodal disease in the pelvis is outlined on the PET exam. Electronically Signed: By: Zetta Bills M.D. On: 02/14/2021 13:38   CT PELVIS W CONTRAST  Result Date: 02/20/2021 CLINICAL DATA:  55 year old with rectal cancer with a percutaneous drain. Patient complains of fluid leaking around the drain. EXAM: CT PELVIS WITH CONTRAST TECHNIQUE: Multidetector CT imaging of the pelvis was performed using the standard protocol following the bolus administration of intravenous contrast. CONTRAST:  163m OMNIPAQUE IOHEXOL 300 MG/ML  SOLN COMPARISON:  CT 02/14/2021 FINDINGS: Urinary Tract: Fluid-filled urinary bladder. Right kidney is only partially imaged. Bowel: Known large rectal mass with circumferential thickening in the rectum and anal region. No significant  dilatation of the sigmoid colon or other visualized portions of the colon. Normal caliber of the visualized small bowel. Vascular/Lymphatic: Extensive atherosclerotic calcifications. Again noted is low-density nodule or lymph node in the along the right pelvic wall that measures 3.1 x 2.5 cm on sequence 5 image 31. Prominent inguinal lymph nodes bilaterally. Reproductive:  Prostate is poorly visualized. Other: Percutaneous drain was placed in the perirectal/perianal abscess on 02/15/2021. The large perirectal abscess has markedly decreased in size. Small residual pocket of gas and fluid just inferior to the drain on sequence 5, image 48 that measures 3.0 x 2.4 x 2.2 cm. There is new gas in the subcutaneous tissues of the medial left buttock and there is concern for an ulceration or wound in this area. Scattered gas-filled collections along the medial left buttock and perirectal region. Cannot exclude a fistula connection between the rectal mass and the subcutaneous gas-filled collections. Musculoskeletal: No acute bone abnormality. IMPRESSION: 1. Large perirectal/perianal abscess has markedly decreased in size since placement of the percutaneous drain. There are residual gas-filled collections in the soft tissues and suspect there is a fistula or sinus tract between the rectal mass and the subcutaneous tissues. 2. Soft tissue gas along the medial left buttock and concern for a cutaneous ulceration in this area. 3. Large rectal mass with evidence for a large necrotic right pelvic lymph node. Electronically Signed   By: AMarkus DaftM.D.   On: 02/20/2021 15:42   MR Pelvis Wo Contrast  Result Date: 03/12/2021 CLINICAL DATA:  Newly diagnosed rectal carcinoma. EXAM: MRI PELVIS WITHOUT CONTRAST TECHNIQUE: Multiplanar multisequence MR imaging of the pelvis was performed. No intravenous contrast was administered. COMPARISON:  None. FINDINGS: TUMOR LOCATION Tumor distance from Anal Verge/Skin Surface: 1.4 cm Tumor distance  from Internal Anal Sphincter: 0 cm; the tumor directly involves the upper and mid anal sphincter  TUMOR DESCRIPTION Circumferential Extent: 100% Tumor Length: 11.2 cm T - CATEGORY Extension through Muscularis Propria: Yes, measuring up to 23 mm = T3d Shortest Distance from Mass to Mesorectal Fascia: 0 mm Extramural Vascular Invasion/Tumor Thrombus: No Invasion of Anterior Peritoneal Reflection: No Involvement of Adjacent Organs or Pelvic Sidewall: No Levator Ani Involvement: Yesa N - CATEGORY Mesorectal Lymph Nodes >=43m: 4 or more=N2 Extra-mesorectal Lymphadenopathy: Yes, bilateral internal iliac lymphadenopathy is seen, with largest on the right measuring 2.3 cm in short axis = N2. Shotty less than 1 cm bilateral inguinal lymph nodes are seen, which are not pathologically enlarged. Other:  None. IMPRESSION: Assuming pathologic diagnosis of rectal adenocarcinoma, local staging is T3d, N2, Mx. This tumor shows involvement of the anal sphincter. Electronically Signed   By: JMarlaine HindM.D.   On: 03/12/2021 08:39   PERIPHERAL VASCULAR CATHETERIZATION  Result Date: 02/27/2021 See surgical note for result.  NM PET Image Initial (PI) Skull Base To Thigh  Addendum Date: 02/14/2021   ADDENDUM REPORT: 02/14/2021 13:43 ADDENDUM: For clarification the comparison from May 2nd is from 2021. Nodal disease that is present on the current study and the most recent comparison is not visible on the previous more remote CT assessment. Nor is the fluid density in the area of the mesorectum that tracks above and below the pelvic floor as described. Additionally, there is a spiculated nodule in the LEFT upper lobe with morphologic features that raise the question of small bronchogenic neoplasm measuring 9 x 8 mm (8 x 8 mm in the initial report). Referral to multi disciplinary thoracic oncologic setting may be helpful for further assessment. In the current context could consider short interval follow-up or biopsy for further  assessment as warranted. These results will be called to the ordering clinician or representative by the Radiologist Assistant, and communication documented in the PACS or [employer] Electronically Signed   By: GZetta BillsM.D.   On: 02/14/2021 13:43   Addendum Date: 02/14/2021   ADDENDUM REPORT: 02/14/2021 13:21 ADDENDUM: These results were called to the ordering clinician or representative by the Radiologist Assistant, and communication documented in the PACS or [employer] Electronically Signed   By: GZetta BillsM.D.   On: 02/14/2021 13:21   Result Date: 02/14/2021 CLINICAL DATA:  Initial treatment strategy for rectal cancer in a 55year old male. Patient reports severe pain in symptoms that made the examination challenging. EXAM: NUCLEAR MEDICINE PET SKULL BASE TO THIGH TECHNIQUE: 7.97 mCi F-18 FDG was injected intravenously. Full-ring PET imaging was performed from the skull base to thigh after the radiotracer. CT data was obtained and used for attenuation correction and anatomic localization. Fasting blood glucose: 107 mg/dl COMPARISON:  CT of the abdomen and pelvis Feb 05, 2021 and CT of the abdomen and pelvis Jan 07, 2021. FINDINGS: Mediastinal blood pool activity: SUV max 2.05 Liver activity: SUV max NA NECK: Asymmetric muscular activity in the neck in prevertebral musculature. LEFT over RIGHT without visible lesion. Incidental CT findings: none CHEST: Spiculated nodule in the LEFT upper lobe measures approximately 8 x 8 mm with some surrounding spiculation (image [id number] metabolic activity not elevated above mediastinal blood pool. Signs of pulmonary emphysema. Airways are patent. No adenopathy in the chest or signs of suspicious nodule elsewhere. Incidental CT findings: Calcified atherosclerotic plaque of the thoracic aorta. 4 cm caliber of the ascending thoracic aorta. Minimal scattered atherosclerotic plaque with calcification. Heart size normal without pericardial effusion. Normal  caliber central pulmonary vessels. Normal appearance of the  esophagus. ABDOMEN/PELVIS: No signs of solid organ metastasis. Cyst in the RIGHT hepatic lobe. Marked hypermetabolic activity about the patient's known rectal mass. This mass appears less well-defined. The mesorectum is filled with low attenuation to the LEFT posterolateral aspect of the mesorectum with crescentic appearance tracking into the upper sphincter complex, horseshoe shaped low attenuation surrounds the rectum and anus as it extends towards the pelvic floor. Mass measuring approximately 6.5 x 4.9 cm (image 248/3) maximum SUV 17.6. Lenticular area of low attenuation with some peripheral increased metabolic activity, more nodular along the LEFT posterolateral margin on image 257 of series 3. This area measures approximately 2.7 cm in greatest thickness and surrounds the rectum and anus approximately 270 degrees of its circumference above the pelvic floor and at the pelvic floor slightly less. Bulky RIGHT pelvic sidewall/hypogastric lymph node outside of the mesorectum with stippled calcification measuring 19 mm and without signs of increased metabolic activity. Small LEFT hypogastric lymph node just peripheral to the internal external bifurcation on image 228 of series 3 displays low level FDG uptake with a maximum SUV of 3.2. High RIGHT internal just below or at the internal/common iliac transition, lymph node (image 214/3) 12 mm maximum SUV of 4.8. LEFT high hypogastric lymph node (image 217/3) 8 mm with low level FDG uptake, maximum SUV of 3.0. Scattered lymph nodes throughout the retroperitoneum with FDG uptake only slightly above mediastinal blood pool. For instance on image 181 of series 3 is an 8 mm lymph node with a maximum SUV of 2.2 along the LEFT periaortic chain. Bilateral inguinal lymph nodes largest on the RIGHT (image 248/3) 11 mm with a maximum SUV of 2.8 Primary tumor difficult to assess in terms of margins due to surrounding  stranding which has worsened since previous imaging. Incidental CT findings: Tiny hypodensity in the RIGHT hemi liver (image 138/3) no focal increased FDG uptake though this 4 mm area may be below PET resolution in this context. SKELETON: Diffuse increased metabolic activity throughout the visualized skeleton without focal area of activity to suggest bony metastasis. Incidental CT findings: none IMPRESSION: Signs of locally advanced rectal cancer with ballooning of the mesorectum now with low attenuation material that was not present on previous examinations (not present on Jan 31, 2023 and much less pronounced on March 01, 2023) with extensive stranding and inflammation. Findings suspicious for rectal compromise/focal defect in the rectum at the site of tumor or along the LEFT posterolateral aspect and contained inflammation in the mesorectum. Rectal tumor also appears bulkier in general and part of this could be due to marked worsening in short interval of rectal tumor. Large lymph node along the RIGHT pelvic sidewall and smaller lymph nodes as outlined above in the pelvis outside of the mesorectum including inguinal lymph nodes which are suspicious for disease. Note that the dominant lymph node displays little FDG uptake, potentially due to necrosis. Lymph nodes in the groin are equivocal in the setting of extensive inflammation and potentially reactive showing enlargement since previous imaging from 2023/01/31. No signs of solid organ metastasis. Diffuse marrow uptake likely related to symptomatic anemia. Electronically Signed: By: Zetta Bills M.D. On: 02/14/2021 08:58   CT IMAGE GUIDED DRAINAGE BY PERCUTANEOUS CATHETER  Result Date: 02/15/2021 INDICATION: 55 year old gentleman with perirectal abscess presents to IR for CT-guided drain placement EXAM: CT GUIDED DRAINAGE OF PERIRECTAL ABSCESS MEDICATIONS: The patient is currently admitted to the hospital and receiving intravenous antibiotics. The antibiotics were  administered within an appropriate time frame prior to the initiation  of the procedure. ANESTHESIA/SEDATION: Two mg IV Versed 100 mcg IV Fentanyl Moderate Sedation Time:  10 minutes The patient was continuously monitored during the procedure by the interventional radiology nurse under my direct supervision. COMPLICATIONS: None immediate. TECHNIQUE: Informed written consent was obtained from the patient after a thorough discussion of the procedural risks, benefits and alternatives. All questions were addressed. Maximal Sterile Barrier Technique was utilized including caps, mask, sterile gowns, sterile gloves, sterile drape, hand hygiene and skin antiseptic. A timeout was performed prior to the initiation of the procedure. PROCEDURE: Patient position prone on the procedure table. The posterior pelvic wall skin prepped and draped in usual fashion. Following local lidocaine administration, 18 gauge needle advanced into the perirectal fluid collection utilizing CT guidance. 18 gauge needle exchanged for 10 [place] multipurpose pigtail drain over 0.035 inch guidewire. 50 mL purulent sample aspirated and sent for Gram stain and culture. Drain secured to skin with suture and connected to bulb suction. Post placement CT confirmed appropriate placement of the drain. IMPRESSION: 10 [place] multipurpose pigtail drain placed in perirectal abscess. Electronically Signed   By: Miachel Roux M.D.   On: 02/15/2021 15:24        ASSESSMENT & PLAN:  1. Rectal cancer ([place])   2. Iron deficiency anemia due to chronic blood loss   3. Neoplasm related pain   4. Encounter for antineoplastic chemotherapy   Cancer Staging Rectal cancer [facility]) Staging form: Colon and Rectum, AJCC 8th Edition - Clinical stage from 02/08/2021: Stage IIIB (cT4a, cN1, cM0) - Signed by Earlie Server, MD on 03/06/2021   #locally advanced rectal cancer. His case is complicated with a perirectal abscess. Prior to onset of perirectal abscess, on his 02/05/2021  scan, he was noted to have right internal iliac lymph node 3 x 1 x 2.3 cm which is concerning for nodal disease.On Subsequent images it is difficult to distinguish whether lymphadenopathy was due to nodal disease versus acute inflammation. 03/07/2021 MRI pelvis cT3 N2. Due to the possible contained perforation on previous CT, possible cT4 disease.   He is on TNT neoadjuvant protocol.  Labs are reviewed and discussed with patient. Proceed with cycle 5 FOLFOX Established care with [name].   #Spiculated lung nodule, likely primary lung cancer. Discussed with IR, CT guided biopsy is difficult given the position and small size. Will monitor for now. His rectal cancer treatment takes priority.   #Iron deficiency anemia Status post IV Venofer treatments, off oral iron supplementation due to constipation.  Hemoglobin improved to 12.   #Rectal pain, continue fentanyl patch 68mg Q72 hours, he does not  need any short acting narcotics at this point.   # Emphysema, recommend him to establish care with PCP. He was provided with a list of PCP office contact information.   Follow up in 2 weeks for lab md FOLFOX All questions were answered. The patient knows to call the clinic with any problems questions or concerns.    ZEarlie Server MD, PhD Hematology Oncology [facility]at AHansen [facility] HospitalPager- 3IE:[phone]/02/2021

## 2021-05-14 NOTE — Progress Notes (Signed)
Nutrition Follow-up:   Patient with rectal cancer.  Patient on folfox.    Patient reports that appetite is good.  Denies any nutrition impact symptoms or concerns.    Medications: reviewed  Labs: reviewed  Anthropometrics:   Weight 158 lb, stable  158 lb on 8/22 149 lb on 6/22  UBW of 180-195 lb, 8-9 months ago   NUTRITION DIAGNOSIS: Inadequate oral intake improved   INTERVENTION:  Continue well balanced diet including good sources of protein    MONITORING, EVALUATION, GOAL: weight trends, intake   NEXT VISIT: to be determined with treatments  Barry [name] B. Zenia Resides, [place], [place] Registered Dietitian [phone] (mobile)

## 2021-05-14 NOTE — Progress Notes (Signed)
Pt in for follow up, reports good appetite, pain controlled with fentanyl patch.

## 2021-05-16 ENCOUNTER — Inpatient Hospital Stay: Payer: Self-pay

## 2021-05-16 ENCOUNTER — Other Ambulatory Visit: Payer: Self-pay

## 2021-05-16 DIAGNOSIS — C20 Malignant neoplasm of rectum: Secondary | ICD-10-CM

## 2021-05-16 MED ORDER — HEPARIN SOD (PORK) LOCK FLUSH 100 UNIT/ML IV SOLN
500.0000 [IU] | Freq: Once | INTRAVENOUS | Status: AC | PRN
  Filled 2021-05-16: qty 5

## 2021-05-16 MED ORDER — SODIUM CHLORIDE 0.9% FLUSH
10.0000 mL | INTRAVENOUS | Status: DC | PRN
  Administered 2021-05-16: 10 mL
  Filled 2021-05-16: qty 10

## 2021-05-16 MED ORDER — HEPARIN SOD (PORK) LOCK FLUSH 100 UNIT/ML IV SOLN
INTRAVENOUS | Status: AC
  Administered 2021-05-16: 500 [IU]
  Filled 2021-05-16: qty 5

## 2021-05-28 ENCOUNTER — Other Ambulatory Visit: Payer: Self-pay

## 2021-05-28 ENCOUNTER — Inpatient Hospital Stay: Payer: Self-pay

## 2021-05-28 ENCOUNTER — Encounter: Payer: Self-pay | Admitting: Oncology

## 2021-05-28 ENCOUNTER — Inpatient Hospital Stay (HOSPITAL_BASED_OUTPATIENT_CLINIC_OR_DEPARTMENT_OTHER): Payer: Self-pay | Admitting: Oncology

## 2021-05-28 VITALS — BP 125/87 | HR 103 | Temp 97.8°F | Resp 20 | Wt 159.2 lb

## 2021-05-28 VITALS — HR 91

## 2021-05-28 DIAGNOSIS — R911 Solitary pulmonary nodule: Secondary | ICD-10-CM

## 2021-05-28 DIAGNOSIS — Z5111 Encounter for antineoplastic chemotherapy: Secondary | ICD-10-CM

## 2021-05-28 DIAGNOSIS — C20 Malignant neoplasm of rectum: Secondary | ICD-10-CM

## 2021-05-28 DIAGNOSIS — Z95828 Presence of other vascular implants and grafts: Secondary | ICD-10-CM

## 2021-05-28 DIAGNOSIS — G893 Neoplasm related pain (acute) (chronic): Secondary | ICD-10-CM

## 2021-05-28 LAB — CBC WITH DIFFERENTIAL/PLATELET
Abs Immature Granulocytes: 0 10*3/uL (ref 0.00–0.07)
Basophils Absolute: 0 10*3/uL (ref 0.0–0.1)
Basophils Relative: 1 %
Eosinophils Absolute: 0.2 10*3/uL (ref 0.0–0.5)
Eosinophils Relative: 6 %
HCT: 35.9 % — ABNORMAL LOW (ref 39.0–52.0)
Hemoglobin: 11.9 g/dL — ABNORMAL LOW (ref 13.0–17.0)
Immature Granulocytes: 0 %
Lymphocytes Relative: 42 %
Lymphs Abs: 1.8 10*3/uL (ref 0.7–4.0)
MCH: 28.3 pg (ref 26.0–34.0)
MCHC: 33.1 g/dL (ref 30.0–36.0)
MCV: 85.5 fL (ref 80.0–100.0)
Monocytes Absolute: 1 10*3/uL (ref 0.1–1.0)
Monocytes Relative: 23 %
Neutro Abs: 1.2 10*3/uL — ABNORMAL LOW (ref 1.7–7.7)
Neutrophils Relative %: 28 %
Platelets: 116 10*3/uL — ABNORMAL LOW (ref 150–400)
RBC: 4.2 MIL/uL — ABNORMAL LOW (ref 4.22–5.81)
RDW: 19.3 % — ABNORMAL HIGH (ref 11.5–15.5)
WBC: 4.2 10*3/uL (ref 4.0–10.5)
nRBC: 0 % (ref 0.0–0.2)

## 2021-05-28 LAB — COMPREHENSIVE METABOLIC PANEL
ALT: 35 U/L (ref 0–44)
AST: 37 U/L (ref 15–41)
Albumin: 3.4 g/dL — ABNORMAL LOW (ref 3.5–5.0)
Alkaline Phosphatase: 81 U/L (ref 38–126)
Anion gap: 7 (ref 5–15)
BUN: 9 mg/dL (ref 6–20)
CO2: 27 mmol/L (ref 22–32)
Calcium: 8.8 mg/dL — ABNORMAL LOW (ref 8.9–10.3)
Chloride: 101 mmol/L (ref 98–111)
Creatinine, Ser: 1.04 mg/dL (ref 0.61–1.24)
GFR, Estimated: >60 mL/min (ref >60)
Glucose, Bld: 188 mg/dL — ABNORMAL HIGH (ref 70–99)
Potassium: 3.5 mmol/L (ref 3.5–5.1)
Sodium: 135 mmol/L (ref 135–145)
Total Bilirubin: 0.3 mg/dL (ref 0.3–1.2)
Total Protein: 7.3 g/dL (ref 6.5–8.1)

## 2021-05-28 MED ORDER — OXALIPLATIN CHEMO INJECTION 100 MG/20ML
82.0000 mg/m2 | Freq: Once | INTRAVENOUS | Status: AC
  Administered 2021-05-28: 150 mg via INTRAVENOUS
  Filled 2021-05-28: qty 30

## 2021-05-28 MED ORDER — FENTANYL 50 MCG/HR TD PT72
1.0000 | MEDICATED_PATCH | TRANSDERMAL | 0 refills | Status: DC
  Filled 2021-06-04: qty 10, 30d supply, fill #0

## 2021-05-28 MED ORDER — FLUOROURACIL CHEMO INJECTION 2.5 GM/50ML
400.0000 mg/m2 | Freq: Once | INTRAVENOUS | Status: AC
  Administered 2021-05-28: 750 mg via INTRAVENOUS
  Filled 2021-05-28: qty 15

## 2021-05-28 MED ORDER — DEXTROSE 5 % IV SOLN
Freq: Once | INTRAVENOUS | Status: AC
  Filled 2021-05-28: qty 250

## 2021-05-28 MED ORDER — SODIUM CHLORIDE 0.9% FLUSH
10.0000 mL | Freq: Once | INTRAVENOUS | Status: AC
  Administered 2021-05-28: 10 mL via INTRAVENOUS
  Filled 2021-05-28: qty 10

## 2021-05-28 MED ORDER — SODIUM CHLORIDE 0.9 % IV SOLN
10.0000 mg | Freq: Once | INTRAVENOUS | Status: AC
  Administered 2021-05-28: 10 mg via INTRAVENOUS
  Filled 2021-05-28: qty 10

## 2021-05-28 MED ORDER — FLUOROURACIL CHEMO INJECTION 5 GM/100ML
2400.0000 mg/m2 | INTRAVENOUS | Status: DC
  Administered 2021-05-28: 4350 mg via INTRAVENOUS
  Filled 2021-05-28: qty 87

## 2021-05-28 MED ORDER — LEUCOVORIN CALCIUM INJECTION 350 MG
412.0000 mg/m2 | Freq: Once | INTRAVENOUS | Status: AC
  Administered 2021-05-28: 750 mg via INTRAVENOUS
  Filled 2021-05-28: qty 35

## 2021-05-28 MED ORDER — HEPARIN SOD (PORK) LOCK FLUSH 100 UNIT/ML IV SOLN
500.0000 [IU] | Freq: Once | INTRAVENOUS | Status: DC
  Filled 2021-05-28: qty 5

## 2021-05-28 MED ORDER — PALONOSETRON HCL INJECTION 0.25 MG/5ML
0.2500 mg | Freq: Once | INTRAVENOUS | Status: AC
  Administered 2021-05-28: 0.25 mg via INTRAVENOUS
  Filled 2021-05-28: qty 5

## 2021-05-28 NOTE — Progress Notes (Signed)
Hematology/Oncology follow up note [facility] Telephone:([phone] Fax:(336) [phone]   Patient Care Team: Pcp, No as PCP - General [name], RN as Oncology Nurse Navigator  REFERRING PROVIDER: Lorella Nimrod, MD  CHIEF COMPLAINTS/REASON FOR VISIT:  Follow up for rectal cancer treatment  HISTORY OF PRESENTING ILLNESS:   Barry [name]. is a  55 y.o.  male with PMH listed below was seen in consultation at the request of  Lorella Nimrod, MD  for evaluation of rectal cancer  02/05/2021-02/06/2021 patient was hospitalized due to generalized weakness, intermittent lightheadedness, weight loss and worsening constipation.  02/05/2021 CT abdomen showed concerning of severe rectal wall thickening and right internal iliac lymph node concerning for metastatic disease.  Patient was seen by gastroenterology and had colonoscopy which showed a circumferential fungating mass in the rectum.  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  02/14/2021-02/16/2021 hospitalized due to rectal bleeding and pain.   02/14/2021 CT showed perirectal fluid collection/gas concerning for infection with significant leukocytosis, anemia with hemoglobin of 6.8.  Patient received PRBC transfusion, IV antibiotics.  He underwent an IR guided placement of JP drain into the rectal abscess.  Discharged home with oral Augmentin. 02/20/2021 presented to ER with new skin opening and draining from left gluteus.  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  JP drain has been removed.  Patient was continued on Augmentin.  02/13/2021, PET scan showed locally advanced rectal cancer with billowing of the mesorectum now with low attenuation material that was not present on previous examination with extensive stranding and inflammation.   Bulky RIGHT pelvic sidewall/hypogastric lymph node outside of the mesorectum with stippled calcification measuring 19 mm-no increased metabolic  activity. Small LEFT hypogastric lymph node just peripheral to the internal external bifurcation-SUV 3.2 High RIGHT internal just below or at the internal/common iliac transition, lymph node -SUV 4.8 LEFT high hypogastric lymph node  8 mm-SUV 3. Scattered lymph nodes throughout the retroperitoneum with low FDG uptake Bilateral inguinal lymph nodes largest on the RIGHT (image 248/3) 11 mm with a maximum SUV of 2.8  spiculated nodule in the LEFT upper lobe- 9 x 8 mm  02/14/2021, CT abdomen pelvis without contrast Showed perforated rectal mass with contained perforation with fluid and gas extending above and below the pelvic floor,potentially involving the sphincter complex and extending into LEFT ischial rectal fossa  02/20/2021, CT pelvis with contrast showed Large perirectal/perianal abscess has markedly decreased in size since placement of the percutaneous drain. There are residual gas-filled collections in the soft tissues and suspect there is a fistula or sinus tract between the rectal mass and the subcutaneous tissues. Soft tissue gas along the medial left buttock and concern for a cutaneous ulceration in this area. Large rectal mass with evidence for a large necrotic right pelvic lymph node.   02/27/2021 medi port placed by Dr.Dew 03/13/2021 reports right butt cheek and also left perianal area fullness.he was seen by Dr.Pabon urgently  and had right buttock abscess drained. Left perianal fullness was felt to be due to cancer.   03/13/2021 started [place]  # He was seen by [name] Dr.Mantyh who agrees with TNT followed by surgery.  INTERVAL HISTORY Barry [name]. is a 55 y.o. male who has above history reviewed by me today presents for follow up visit for management of rectal cancer. .  # Constipation improved. He took Laxative and has had diarrhea episodes.  No new complaints. Chronic fatigue.  Appetite is good.  He is  on fentanyl patch and pain is controlled. He prefers to stay on same  dosage of pain patch.  .    Review of Systems  Constitutional:  Negative for appetite change, chills, fatigue, fever and unexpected weight change.  HENT:   Negative for hearing loss and voice change.   Eyes:  Negative for eye problems and icterus.  Respiratory:  Negative for chest tightness, cough and shortness of breath.   Cardiovascular:  Negative for chest pain and leg swelling.  Gastrointestinal:  Positive for rectal pain. Negative for abdominal distention, abdominal pain, blood in stool and constipation.  Endocrine: Negative for hot flashes.  Genitourinary:  Negative for difficulty urinating, dysuria and frequency.   Musculoskeletal:  Negative for arthralgias.  Skin:  Negative for itching and rash.  Neurological:  Negative for light-headedness and numbness.  Hematological:  Negative for adenopathy. Does not bruise/bleed easily.  Psychiatric/Behavioral:  Negative for confusion.    MEDICAL HISTORY:  Past Medical History:  Diagnosis Date   Headache    Left shoulder pain    Rectal cancer ([place])     SURGICAL HISTORY: Past Surgical History:  Procedure Laterality Date   COLONOSCOPY WITH PROPOFOL N/A 02/06/2021   Procedure: COLONOSCOPY WITH PROPOFOL;  Surgeon: Lin Landsman, MD;  Location: [facility] ENDOSCOPY;  Service: Gastroenterology;  Laterality: N/A;   PORTA CATH INSERTION N/A 02/27/2021   Procedure: PORTA CATH INSERTION;  Surgeon: Algernon Huxley, MD;  Location: [place] CV LAB;  Service: Cardiovascular;  Laterality: N/A;   ROTATOR CUFF REPAIR Left     SOCIAL HISTORY: Social History   Socioeconomic History   Marital status: Married    Spouse name: Not on file   Number of children: Not on file   Years of education: Not on file   Highest education level: Not on file  Occupational History   Not on file  Tobacco Use   Smoking status: Former    Packs/day: 1.00    Years: 10.00    Pack years: 10.00    Types: Cigars, Cigarettes    Quit date: 02/04/2021    Years since  quitting: 0.3   Smokeless tobacco: Never  Substance and Sexual Activity   Alcohol use: Not Currently   Drug use: No   Sexual activity: Not on file  Other Topics Concern   Not on file  Social History Narrative   Not on file   Social Determinants of Health   Financial Resource Strain: Not on file  Food Insecurity: Not on file  Transportation Needs: Not on file  Physical Activity: Not on file  Stress: Not on file  Social Connections: Not on file  Intimate Partner Violence: Not on file    FAMILY HISTORY: Family History  Problem Relation Age of Onset   Cancer Sister    Diabetes Mother    Cancer Maternal Grandmother    Cancer Paternal Grandmother     ALLERGIES:  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  MEDICATIONS:  Current Outpatient Medications  Medication Sig Dispense Refill   feeding supplement (ENSURE ENLIVE / ENSURE PLUS) LIQD Take 237 mLs by mouth daily. 237 mL 12   ondansetron (ZOFRAN) 8 MG tablet Take 1 tablet (8 mg total) by mouth 2 (two) times daily as needed for refractory nausea / vomiting. Start on day 3 after chemotherapy. 30 tablet 1   prochlorperazine (COMPAZINE) 10 MG tablet Take 1 tablet (10 mg total) by mouth every 6 (six) hours as needed (Nausea or vomiting). 30 tablet 1   senna-docusate (  SENOKOT-S) 8.6-50 MG tablet Take 2 tablets by mouth daily. 120 tablet 0   [START ON 06/03/2021] fentaNYL (DURAGESIC) 50 MCG/HR Place 1 patch onto the skin every 3 (three) days. 10 patch 0   HYDROcodone-acetaminophen (NORCO) 5-325 MG tablet Take 1 tablet by mouth every 6 (six) hours as needed for moderate pain. (Patient not taking: No sig reported) 90 tablet 0   ibuprofen (ADVIL) 400 MG tablet Take 1 tablet (400 mg total) by mouth every 6 (six) hours as needed. (Patient not taking: No sig reported) 30 tablet 0   lidocaine-prilocaine (EMLA) cream Apply to affected area once (Patient not taking: No sig reported) 30 g 3   nicotine (NICODERM CQ - DOSED IN MG/24 HOURS) 21 mg/24hr patch  Place 1 patch (21 mg total) onto the skin daily. (Patient not taking: No sig reported) 28 patch 0   No current facility-administered medications for this visit.   Facility-Administered Medications Ordered in Other Visits  Medication Dose Route Frequency Provider Last Rate Last Admin   fluorouracil (ADRUCIL) 4,350 mg in sodium chloride 0.9 % 63 mL chemo infusion  2,400 mg/m2 (Treatment Plan Recorded) Intravenous 1 day or 1 dose Earlie Server, MD   4,350 mg at 05/28/21 1325   heparin lock flush 100 unit/mL  500 Units Intravenous Once Earlie Server, MD         PHYSICAL EXAMINATION: ECOG PERFORMANCE STATUS: 1 - Symptomatic but completely ambulatory Vitals:   05/28/21 0840  BP: 125/87  Pulse: (!) 103  Resp: 20  Temp: 97.8 F (36.6 C)  SpO2: 100%     Filed Weights   05/28/21 0840  Weight: 159 lb 3.2 oz (72.2 kg)     Physical Exam Constitutional:      Comments: he ambulates independently.  HENT:     Head: Normocephalic and atraumatic.  Eyes:     General: No scleral icterus. Cardiovascular:     Rate and Rhythm: Normal rate and regular rhythm.     Heart sounds: Normal heart sounds.  Pulmonary:     Effort: Pulmonary effort is normal. No respiratory distress.     Breath sounds: No wheezing.     Comments: Decreased breath sound bilaterally.  Abdominal:     General: Bowel sounds are normal. There is no distension.     Palpations: Abdomen is soft.  Musculoskeletal:        General: No deformity. Normal range of motion.     Cervical back: Normal range of motion and neck supple.  Skin:    General: Skin is warm and dry.     Findings: No erythema or rash.  Neurological:     Mental Status: He is alert and oriented to person, place, and time. Mental status is at baseline.     Cranial Nerves: No cranial nerve deficit.     Coordination: Coordination normal.  Psychiatric:        Mood and Affect: Mood normal.    LABORATORY DATA:  I have reviewed the data as listed Lab Results  Component  Value Date   WBC 4.2 05/28/2021   HGB 11.9 (L) 05/28/2021   HCT 35.9 (L) 05/28/2021   MCV 85.5 05/28/2021   PLT 116 (L) 05/28/2021   Recent Labs    04/29/21 0820 05/14/21 0854 05/28/21 0821  NA 135 134* 135  K 4.2 4.1 3.5  CL 101 101 101  CO2 30 26 27   GLUCOSE 135* 99 188*  BUN 10 8 9   CREATININE 0.91 0.91 1.04  CALCIUM  8.7* 8.9 8.8*  GFRNONAA >60 >60 >60  PROT 7.8 7.8 7.3  ALBUMIN 3.3* 3.4* 3.4*  AST 54* 33 37  ALT 60* 39 35  ALKPHOS 98 79 81  BILITOT 0.6 0.5 0.3    Iron/TIBC/Ferritin/ %Sat    Component Value Date/Time   IRON 28 (L) 03/25/2021 1029   TIBC 356 03/25/2021 1029   FERRITIN 118 03/25/2021 1029   IRONPCTSAT 8 (L) 03/25/2021 1029      RADIOGRAPHIC STUDIES: I have personally reviewed the radiological images as listed and agreed with the findings in the report. MR Pelvis Wo Contrast  Result Date: 03/12/2021 CLINICAL DATA:  Newly diagnosed rectal carcinoma. EXAM: MRI PELVIS WITHOUT CONTRAST TECHNIQUE: Multiplanar multisequence MR imaging of the pelvis was performed. No intravenous contrast was administered. COMPARISON:  None. FINDINGS: TUMOR LOCATION Tumor distance from Anal Verge/Skin Surface: 1.4 cm Tumor distance from Internal Anal Sphincter: 0 cm; the tumor directly involves the upper and mid anal sphincter TUMOR DESCRIPTION Circumferential Extent: 100% Tumor Length: 11.2 cm T - CATEGORY Extension through Muscularis Propria: Yes, measuring up to 23 mm = T3d Shortest Distance from Mass to Mesorectal Fascia: 0 mm Extramural Vascular Invasion/Tumor Thrombus: No Invasion of Anterior Peritoneal Reflection: No Involvement of Adjacent Organs or Pelvic Sidewall: No Levator Ani Involvement: Yesa N - CATEGORY Mesorectal Lymph Nodes >=58mm: 4 or more=N2 Extra-mesorectal Lymphadenopathy: Yes, bilateral internal iliac lymphadenopathy is seen, with largest on the right measuring 2.3 cm in short axis = N2. Shotty less than 1 cm bilateral inguinal lymph nodes are seen, which are  not pathologically enlarged. Other:  None. IMPRESSION: Assuming pathologic diagnosis of rectal adenocarcinoma, local staging is T3d, N2, Mx. This tumor shows involvement of the anal sphincter. Electronically Signed   By: Marlaine Hind M.D.   On: 03/12/2021 08:39        ASSESSMENT & PLAN:  1. Rectal cancer ([place])   2. Neoplasm related pain   3. Encounter for antineoplastic chemotherapy   4. Lung nodule   Cancer Staging Rectal cancer [facility]) Staging form: Colon and Rectum, AJCC 8th Edition - Clinical stage from 02/08/2021: Stage IIIB (cT4a, cN1, cM0) - Signed by Earlie Server, MD on 03/06/2021   #locally advanced rectal cancer. His case is complicated with a perirectal abscess. Prior to onset of perirectal abscess, on his 02/05/2021 scan, he was noted to have right internal iliac lymph node 3 x 1 x 2.3 cm which is concerning for nodal disease.On Subsequent images it is difficult to distinguish whether lymphadenopathy was due to nodal disease versus acute inflammation. 03/07/2021 MRI pelvis cT3 N2. Due to the possible contained perforation on previous CT, possible cT4 disease.   He is on TNT neoadjuvant protocol.  Labs are reviewed and discussed with patient. Proceed with cycle 6 FOLFOX Established care with [name].   #Spiculated lung nodule, likely primary lung cancer. Discussed with IR, CT guided biopsy is difficult given the position and small size. Will monitor for now. His rectal cancer treatment takes priority.   #Iron deficiency anemia Status post IV Venofer treatments, off oral iron supplementation due to constipation.  Hemoglobin 11.9  #Rectal pain, continue fentanyl patch 21mcg Q72 hours, he does not  need any short acting narcotics at this point. Patient requests refills  # Emphysema, recommend him to establish care with PCP. He was provided with a list of PCP office contact information.   Follow up in 2 weeks for lab md FOLFOX All questions were answered. The patient knows to  call  the clinic with any problems questions or concerns.    Earlie Server, MD, PhD Hematology Oncology [facility] at [facility] Pager- [phone] 05/28/2021

## 2021-05-28 NOTE — Patient Instructions (Signed)
[facility]  Discharge Instructions: Thank you for choosing [facility] to provide your oncology and hematology care.  If you have a lab appointment with the [facility], please go directly to the [facility] and check in at the registration area.  Wear comfortable clothing and clothing appropriate for easy access to any Portacath or PICC line.   We strive to give you quality time with your provider. You may need to reschedule your appointment if you arrive late (15 or more minutes).  Arriving late affects you and other patients whose appointments are after yours.  Also, if you miss three or more appointments without notifying the office, you may be dismissed from the clinic at the provider's discretion.      For prescription refill requests, have your pharmacy contact our office and allow 72 hours for refills to be completed.    Today you received the following chemotherapy and/or immunotherapy agents: FOLFOX, The chemotherapy medication bag should finish at 46 hours, 96 hours, or 7 days. For example, if your pump is scheduled for 46 hours and it was put on at 4:00 p.m., it should finish at 2:00 p.m. the day it is scheduled to come off regardless of your appointment time.     Estimated time to finish at 1130am.   If the display on your pump reads "Low Volume" and it is beeping, take the batteries out of the pump and come to the cancer center for it to be taken off.   If the pump alarms go off prior to the pump reading "Low Volume" then call [phone] and someone can assist you.  If the plunger comes out and the chemotherapy medication is leaking out, please use your home chemo spill kit to clean up the spill. Do NOT use paper towels or other household products.  If you have problems or questions regarding your pump, please call either [phone] (24 hours a day) or the cancer center Monday-Friday 8:00 a.m.- 4:30 p.m. at the clinic  number and we will assist you. If you are unable to get assistance, then go to the nearest Emergency Department and ask the staff to contact the IV team for assistance.        To help prevent nausea and vomiting after your treatment, we encourage you to take your nausea medication as directed.  BELOW ARE SYMPTOMS THAT SHOULD BE REPORTED IMMEDIATELY: *FEVER GREATER THAN 100.4 F (38 C) OR HIGHER *CHILLS OR SWEATING *NAUSEA AND VOMITING THAT IS NOT CONTROLLED WITH YOUR NAUSEA MEDICATION *UNUSUAL SHORTNESS OF BREATH *UNUSUAL BRUISING OR BLEEDING *URINARY PROBLEMS (pain or burning when urinating, or frequent urination) *BOWEL PROBLEMS (unusual diarrhea, constipation, pain near the anus) TENDERNESS IN MOUTH AND THROAT WITH OR WITHOUT PRESENCE OF ULCERS (sore throat, sores in mouth, or a toothache) UNUSUAL RASH, SWELLING OR PAIN  UNUSUAL VAGINAL DISCHARGE OR ITCHING   Items with * indicate a potential emergency and should be followed up as soon as possible or go to the Emergency Department if any problems should occur.  Please show the CHEMOTHERAPY ALERT CARD or IMMUNOTHERAPY ALERT CARD at check-in to the Emergency Department and triage nurse.  Should you have questions after your visit or need to cancel or reschedule your appointment, please contact [facility]  [phone] and follow the prompts.  Office hours are 8:00 a.m. to 4:30 p.m. Monday - Friday. Please note that voicemails left after 4:00 p.m. may not be returned until the  following business day.  We are closed weekends and major holidays. You have access to a nurse at all times for urgent questions. Please call the main number to the clinic [phone] and follow the prompts.  For any non-urgent questions, you may also contact your provider using MyChart. We now offer e-Visits for anyone 12 and older to request care online for non-urgent symptoms. For details visit [email or web address].   Also  download the MyChart app! Go to the app store, search "MyChart", open the app, select [facility], and log in with your MyChart username and password.  Due to Covid, a mask is required upon entering the hospital/clinic. If you do not have a mask, one will be given to you upon arrival. For doctor visits, patients may have 1 support person aged 69 or older with them. For treatment visits, patients cannot have anyone with them due to current Covid guidelines and our immunocompromised population. Fluorouracil, 5-FU injection What is this medication? FLUOROURACIL, 5-FU (flure oh YOOR a sil) is a chemotherapy drug. It slows the growth of cancer cells. This medicine is used to treat many types of cancer like breast cancer, colon or rectal cancer, pancreatic cancer, and stomach cancer. This medicine may be used for other purposes; ask your health care provider or pharmacist if you have questions. COMMON BRAND NAME(S): Adrucil What should I tell my care team before I take this medication? They need to know if you have any of these conditions: blood disorders dihydropyrimidine dehydrogenase (DPD) deficiency infection (especially a virus infection such as chickenpox, cold sores, or herpes) kidney disease liver disease malnourished, poor nutrition recent or ongoing radiation therapy an unusual or allergic reaction to fluorouracil, other chemotherapy, other medicines, foods, dyes, or preservatives pregnant or trying to get pregnant breast-feeding How should I use this medication? This drug is given as an infusion or injection into a vein. It is administered in a hospital or clinic by a specially trained health care professional. Talk to your pediatrician regarding the use of this medicine in children. Special care may be needed. Overdosage: If you think you have taken too much of this medicine contact a poison control center or emergency room at once. NOTE: This medicine is only for you. Do not share this  medicine with others. What if I miss a dose? It is important not to miss your dose. Call your doctor or health care professional if you are unable to keep an appointment. What may interact with this medication? Do not take this medicine with any of the following medications: live virus vaccines This medicine may also interact with the following medications: medicines that treat or prevent blood clots like warfarin, enoxaparin, and dalteparin This list may not describe all possible interactions. Give your health care provider a list of all the medicines, herbs, non-prescription drugs, or dietary supplements you use. Also tell them if you smoke, drink alcohol, or use illegal drugs. Some items may interact with your medicine. What should I watch for while using this medication? Visit your doctor for checks on your progress. This drug may make you feel generally unwell. This is not uncommon, as chemotherapy can affect healthy cells as well as cancer cells. Report any side effects. Continue your course of treatment even though you feel ill unless your doctor tells you to stop. In some cases, you may be given additional medicines to help with side effects. Follow all directions for their use. Call your doctor or health care professional for advice if you get  a fever, chills or sore throat, or other symptoms of a cold or flu. Do not treat yourself. This drug decreases your body's ability to fight infections. Try to avoid being around people who are sick. This medicine may increase your risk to bruise or bleed. Call your doctor or health care professional if you notice any unusual bleeding. Be careful brushing and flossing your teeth or using a toothpick because you may get an infection or bleed more easily. If you have any dental work done, tell your dentist you are receiving this medicine. Avoid taking products that contain aspirin, acetaminophen, ibuprofen, naproxen, or ketoprofen unless instructed by your  doctor. These medicines may hide a fever. Do not become pregnant while taking this medicine. Women should inform their doctor if they wish to become pregnant or think they might be pregnant. There is a potential for serious side effects to an unborn child. Talk to your health care professional or pharmacist for more information. Do not breast-feed an infant while taking this medicine. Men should inform their doctor if they wish to father a child. This medicine may lower sperm counts. Do not treat diarrhea with over the counter products. Contact your doctor if you have diarrhea that lasts more than 2 days or if it is severe and watery. This medicine can make you more sensitive to the sun. Keep out of the sun. If you cannot avoid being in the sun, wear protective clothing and use sunscreen. Do not use sun lamps or tanning beds/booths. What side effects may I notice from receiving this medication? Side effects that you should report to your doctor or health care professional as soon as possible: allergic reactions like skin rash, itching or hives, swelling of the face, lips, or tongue low blood counts - this medicine may decrease the number of white blood cells, red blood cells and platelets. You may be at increased risk for infections and bleeding. signs of infection - fever or chills, cough, sore throat, pain or difficulty passing urine signs of decreased platelets or bleeding - bruising, pinpoint red spots on the skin, black, tarry stools, blood in the urine signs of decreased red blood cells - unusually weak or tired, fainting spells, lightheadedness breathing problems changes in vision chest pain mouth sores nausea and vomiting pain, swelling, redness at site where injected pain, tingling, numbness in the hands or feet redness, swelling, or sores on hands or feet stomach pain unusual bleeding Side effects that usually do not require medical attention (report to your doctor or health care  professional if they continue or are bothersome): changes in finger or toe nails diarrhea dry or itchy skin hair loss headache loss of appetite sensitivity of eyes to the light stomach upset unusually teary eyes This list may not describe all possible side effects. Call your doctor for medical advice about side effects. You may report side effects to FDA at 1-800-FDA-1088. Where should I keep my medication? This drug is given in a hospital or clinic and will not be stored at home. NOTE: This sheet is a summary. It may not cover all possible information. If you have questions about this medicine, talk to your doctor, pharmacist, or health care provider.  2022 Elsevier/Gold Standard (2019-07-26 15:00:03) Leucovorin injection What is this medication? LEUCOVORIN (loo koe VOR in) is used to prevent or treat the harmful effects of some medicines. This medicine is used to treat anemia caused by a low amount of folic acid in the body. It is also used  with 5-fluorouracil (5-FU) to treat colon cancer. This medicine may be used for other purposes; ask your health care provider or pharmacist if you have questions. What should I tell my care team before I take this medication? They need to know if you have any of these conditions: anemia from low levels of vitamin B-12 in the blood an unusual or allergic reaction to leucovorin, folic acid, other medicines, foods, dyes, or preservatives pregnant or trying to get pregnant breast-feeding How should I use this medication? This medicine is for injection into a muscle or into a vein. It is given by a health care professional in a hospital or clinic setting. Talk to your pediatrician regarding the use of this medicine in children. Special care may be needed. Overdosage: If you think you have taken too much of this medicine contact a poison control center or emergency room at once. NOTE: This medicine is only for you. Do not share this medicine with  others. What if I miss a dose? This does not apply. What may interact with this medication? capecitabine fluorouracil phenobarbital phenytoin primidone trimethoprim-sulfamethoxazole This list may not describe all possible interactions. Give your health care provider a list of all the medicines, herbs, non-prescription drugs, or dietary supplements you use. Also tell them if you smoke, drink alcohol, or use illegal drugs. Some items may interact with your medicine. What should I watch for while using this medication? Your condition will be monitored carefully while you are receiving this medicine. This medicine may increase the side effects of 5-fluorouracil, 5-FU. Tell your doctor or health care professional if you have diarrhea or mouth sores that do not get better or that get worse. What side effects may I notice from receiving this medication? Side effects that you should report to your doctor or health care professional as soon as possible: allergic reactions like skin rash, itching or hives, swelling of the face, lips, or tongue breathing problems fever, infection mouth sores unusual bleeding or bruising unusually weak or tired Side effects that usually do not require medical attention (report to your doctor or health care professional if they continue or are bothersome): constipation or diarrhea loss of appetite nausea, vomiting This list may not describe all possible side effects. Call your doctor for medical advice about side effects. You may report side effects to FDA at 1-800-FDA-1088. Where should I keep my medication? This drug is given in a hospital or clinic and will not be stored at home. NOTE: This sheet is a summary. It may not cover all possible information. If you have questions about this medicine, talk to your doctor, pharmacist, or health care provider.  2022 Elsevier/Gold Standard (2008-02-29 16:50:29) Oxaliplatin Injection What is this medication? OXALIPLATIN  (ox AL i PLA tin) is a chemotherapy drug. It targets fast dividing cells, like cancer cells, and causes these cells to die. This medicine is used to treat cancers of the colon and rectum, and many other cancers. This medicine may be used for other purposes; ask your health care provider or pharmacist if you have questions. COMMON BRAND NAME(S): Eloxatin What should I tell my care team before I take this medication? They need to know if you have any of these conditions: heart disease history of irregular heartbeat liver disease low blood counts, like white cells, platelets, or red blood cells lung or breathing disease, like asthma take medicines that treat or prevent blood clots tingling of the fingers or toes, or other nerve disorder an unusual or allergic  reaction to oxaliplatin, other chemotherapy, other medicines, foods, dyes, or preservatives pregnant or trying to get pregnant breast-feeding How should I use this medication? This drug is given as an infusion into a vein. It is administered in a hospital or clinic by a specially trained health care professional. Talk to your pediatrician regarding the use of this medicine in children. Special care may be needed. Overdosage: If you think you have taken too much of this medicine contact a poison control center or emergency room at once. NOTE: This medicine is only for you. Do not share this medicine with others. What if I miss a dose? It is important not to miss a dose. Call your doctor or health care professional if you are unable to keep an appointment. What may interact with this medication? Do not take this medicine with any of the following medications: cisapride dronedarone pimozide thioridazine This medicine may also interact with the following medications: aspirin and aspirin-like medicines certain medicines that treat or prevent blood clots like warfarin, apixaban, dabigatran, and  rivaroxaban cisplatin cyclosporine diuretics medicines for infection like acyclovir, adefovir, amphotericin B, bacitracin, cidofovir, foscarnet, ganciclovir, gentamicin, pentamidine, vancomycin NSAIDs, medicines for pain and inflammation, like ibuprofen or naproxen other medicines that prolong the QT interval (an abnormal heart rhythm) pamidronate zoledronic acid This list may not describe all possible interactions. Give your health care provider a list of all the medicines, herbs, non-prescription drugs, or dietary supplements you use. Also tell them if you smoke, drink alcohol, or use illegal drugs. Some items may interact with your medicine. What should I watch for while using this medication? Your condition will be monitored carefully while you are receiving this medicine. You may need blood work done while you are taking this medicine. This medicine may make you feel generally unwell. This is not uncommon as chemotherapy can affect healthy cells as well as cancer cells. Report any side effects. Continue your course of treatment even though you feel ill unless your healthcare professional tells you to stop. This medicine can make you more sensitive to cold. Do not drink cold drinks or use ice. Cover exposed skin before coming in contact with cold temperatures or cold objects. When out in cold weather wear warm clothing and cover your mouth and nose to warm the air that goes into your lungs. Tell your doctor if you get sensitive to the cold. Do not become pregnant while taking this medicine or for 9 months after stopping it. Women should inform their health care professional if they wish to become pregnant or think they might be pregnant. Men should not father a child while taking this medicine and for 6 months after stopping it. There is potential for serious side effects to an unborn child. Talk to your health care professional for more information. Do not breast-feed a child while taking this  medicine or for 3 months after stopping it. This medicine has caused ovarian failure in some women. This medicine may make it more difficult to get pregnant. Talk to your health care professional if you are concerned about your fertility. This medicine has caused decreased sperm counts in some men. This may make it more difficult to father a child. Talk to your health care professional if you are concerned about your fertility. This medicine may increase your risk of getting an infection. Call your health care professional for advice if you get a fever, chills, or sore throat, or other symptoms of a cold or flu. Do not treat yourself.  Try to avoid being around people who are sick. Avoid taking medicines that contain aspirin, acetaminophen, ibuprofen, naproxen, or ketoprofen unless instructed by your health care professional. These medicines may hide a fever. Be careful brushing or flossing your teeth or using a toothpick because you may get an infection or bleed more easily. If you have any dental work done, tell your dentist you are receiving this medicine. What side effects may I notice from receiving this medication? Side effects that you should report to your doctor or health care professional as soon as possible: allergic reactions like skin rash, itching or hives, swelling of the face, lips, or tongue breathing problems cough low blood counts - this medicine may decrease the number of white blood cells, red blood cells, and platelets. You may be at increased risk for infections and bleeding nausea, vomiting pain, redness, or irritation at site where injected pain, tingling, numbness in the hands or feet signs and symptoms of bleeding such as bloody or black, tarry stools; red or dark brown urine; spitting up blood or brown material that looks like coffee grounds; red spots on the skin; unusual bruising or bleeding from the eyes, gums, or nose signs and symptoms of a dangerous change in  heartbeat or heart rhythm like chest pain; dizziness; fast, irregular heartbeat; palpitations; feeling faint or lightheaded; falls signs and symptoms of infection like fever; chills; cough; sore throat; pain or trouble passing urine signs and symptoms of liver injury like dark yellow or brown urine; general ill feeling or flu-like symptoms; light-colored stools; loss of appetite; nausea; right upper belly pain; unusually weak or tired; yellowing of the eyes or skin signs and symptoms of low red blood cells or anemia such as unusually weak or tired; feeling faint or lightheaded; falls signs and symptoms of muscle injury like dark urine; trouble passing urine or change in the amount of urine; unusually weak or tired; muscle pain; back pain Side effects that usually do not require medical attention (report to your doctor or health care professional if they continue or are bothersome): changes in taste diarrhea gas hair loss loss of appetite mouth sores This list may not describe all possible side effects. Call your doctor for medical advice about side effects. You may report side effects to FDA at 1-800-FDA-1088. Where should I keep my medication? This drug is given in a hospital or clinic and will not be stored at home. NOTE: This sheet is a summary. It may not cover all possible information. If you have questions about this medicine, talk to your doctor, pharmacist, or health care provider.  2022 Elsevier/Gold Standard (2019-01-12 12:20:35)

## 2021-05-30 ENCOUNTER — Inpatient Hospital Stay: Payer: Self-pay

## 2021-05-30 ENCOUNTER — Encounter: Payer: Self-pay | Admitting: Oncology

## 2021-05-30 VITALS — BP 126/83 | HR 100 | Temp 97.4°F | Resp 18

## 2021-05-30 DIAGNOSIS — C20 Malignant neoplasm of rectum: Secondary | ICD-10-CM

## 2021-05-30 MED ORDER — HEPARIN SOD (PORK) LOCK FLUSH 100 UNIT/ML IV SOLN
500.0000 [IU] | Freq: Once | INTRAVENOUS | Status: AC | PRN
  Administered 2021-05-30: 500 [IU]
  Filled 2021-05-30: qty 5

## 2021-05-30 MED ORDER — SODIUM CHLORIDE 0.9% FLUSH
10.0000 mL | INTRAVENOUS | Status: DC | PRN
  Administered 2021-05-30: 10 mL
  Filled 2021-05-30: qty 10

## 2021-06-04 ENCOUNTER — Encounter: Payer: Self-pay | Admitting: Oncology

## 2021-06-04 ENCOUNTER — Other Ambulatory Visit: Payer: Self-pay

## 2021-06-05 ENCOUNTER — Other Ambulatory Visit: Payer: Self-pay

## 2021-06-05 ENCOUNTER — Encounter: Payer: Self-pay | Admitting: Oncology

## 2021-06-11 ENCOUNTER — Inpatient Hospital Stay: Payer: Self-pay | Attending: Oncology

## 2021-06-11 ENCOUNTER — Inpatient Hospital Stay (HOSPITAL_BASED_OUTPATIENT_CLINIC_OR_DEPARTMENT_OTHER): Payer: Self-pay | Admitting: Oncology

## 2021-06-11 ENCOUNTER — Inpatient Hospital Stay: Payer: Self-pay

## 2021-06-11 ENCOUNTER — Other Ambulatory Visit: Payer: Self-pay

## 2021-06-11 ENCOUNTER — Encounter: Payer: Self-pay | Admitting: Oncology

## 2021-06-11 VITALS — BP 135/97 | HR 110 | Temp 98.7°F | Resp 20 | Wt 156.3 lb

## 2021-06-11 DIAGNOSIS — R197 Diarrhea, unspecified: Secondary | ICD-10-CM | POA: Insufficient documentation

## 2021-06-11 DIAGNOSIS — D696 Thrombocytopenia, unspecified: Secondary | ICD-10-CM | POA: Insufficient documentation

## 2021-06-11 DIAGNOSIS — K521 Toxic gastroenteritis and colitis: Secondary | ICD-10-CM

## 2021-06-11 DIAGNOSIS — Z5111 Encounter for antineoplastic chemotherapy: Secondary | ICD-10-CM | POA: Insufficient documentation

## 2021-06-11 DIAGNOSIS — C20 Malignant neoplasm of rectum: Secondary | ICD-10-CM

## 2021-06-11 DIAGNOSIS — R911 Solitary pulmonary nodule: Secondary | ICD-10-CM | POA: Insufficient documentation

## 2021-06-11 DIAGNOSIS — R000 Tachycardia, unspecified: Secondary | ICD-10-CM | POA: Insufficient documentation

## 2021-06-11 DIAGNOSIS — G893 Neoplasm related pain (acute) (chronic): Secondary | ICD-10-CM | POA: Insufficient documentation

## 2021-06-11 DIAGNOSIS — E860 Dehydration: Secondary | ICD-10-CM | POA: Insufficient documentation

## 2021-06-11 DIAGNOSIS — K611 Rectal abscess: Secondary | ICD-10-CM | POA: Insufficient documentation

## 2021-06-11 DIAGNOSIS — Z95828 Presence of other vascular implants and grafts: Secondary | ICD-10-CM

## 2021-06-11 DIAGNOSIS — D509 Iron deficiency anemia, unspecified: Secondary | ICD-10-CM | POA: Insufficient documentation

## 2021-06-11 DIAGNOSIS — T451X5A Adverse effect of antineoplastic and immunosuppressive drugs, initial encounter: Secondary | ICD-10-CM

## 2021-06-11 DIAGNOSIS — Z79891 Long term (current) use of opiate analgesic: Secondary | ICD-10-CM | POA: Insufficient documentation

## 2021-06-11 LAB — CBC WITH DIFFERENTIAL/PLATELET
Abs Immature Granulocytes: 0.01 10*3/uL (ref 0.00–0.07)
Basophils Absolute: 0 10*3/uL (ref 0.0–0.1)
Basophils Relative: 1 %
Eosinophils Absolute: 0.4 10*3/uL (ref 0.0–0.5)
Eosinophils Relative: 5 %
HCT: 41.4 % (ref 39.0–52.0)
Hemoglobin: 13.8 g/dL (ref 13.0–17.0)
Immature Granulocytes: 0 %
Lymphocytes Relative: 38 %
Lymphs Abs: 2.7 10*3/uL (ref 0.7–4.0)
MCH: 28.9 pg (ref 26.0–34.0)
MCHC: 33.3 g/dL (ref 30.0–36.0)
MCV: 86.6 fL (ref 80.0–100.0)
Monocytes Absolute: 1.8 10*3/uL — ABNORMAL HIGH (ref 0.1–1.0)
Monocytes Relative: 25 %
Neutro Abs: 2.2 10*3/uL (ref 1.7–7.7)
Neutrophils Relative %: 31 %
Platelets: 111 10*3/uL — ABNORMAL LOW (ref 150–400)
RBC: 4.78 MIL/uL (ref 4.22–5.81)
RDW: 19.6 % — ABNORMAL HIGH (ref 11.5–15.5)
WBC: 7.1 10*3/uL (ref 4.0–10.5)
nRBC: 0 % (ref 0.0–0.2)

## 2021-06-11 LAB — COMPREHENSIVE METABOLIC PANEL
ALT: 29 U/L (ref 0–44)
AST: 35 U/L (ref 15–41)
Albumin: 3.7 g/dL (ref 3.5–5.0)
Alkaline Phosphatase: 94 U/L (ref 38–126)
Anion gap: 9 (ref 5–15)
BUN: 11 mg/dL (ref 6–20)
CO2: 26 mmol/L (ref 22–32)
Calcium: 9.2 mg/dL (ref 8.9–10.3)
Chloride: 99 mmol/L (ref 98–111)
Creatinine, Ser: 0.95 mg/dL (ref 0.61–1.24)
GFR, Estimated: >60 mL/min (ref >60)
Glucose, Bld: 145 mg/dL — ABNORMAL HIGH (ref 70–99)
Potassium: 3.6 mmol/L (ref 3.5–5.1)
Sodium: 134 mmol/L — ABNORMAL LOW (ref 135–145)
Total Bilirubin: 0.3 mg/dL (ref 0.3–1.2)
Total Protein: 8.3 g/dL — ABNORMAL HIGH (ref 6.5–8.1)

## 2021-06-11 LAB — C DIFFICILE QUICK SCREEN W PCR REFLEX
C Diff antigen: NEGATIVE
C Diff interpretation: NOT DETECTED
C Diff toxin: NEGATIVE

## 2021-06-11 MED ORDER — SODIUM CHLORIDE 0.9% FLUSH
10.0000 mL | Freq: Once | INTRAVENOUS | Status: AC
  Administered 2021-06-11: 10 mL via INTRAVENOUS
  Filled 2021-06-11: qty 10

## 2021-06-11 MED ORDER — HEPARIN SOD (PORK) LOCK FLUSH 100 UNIT/ML IV SOLN
INTRAVENOUS | Status: AC
  Filled 2021-06-11: qty 5

## 2021-06-11 MED ORDER — LOPERAMIDE HCL 2 MG PO CAPS: 2.0000 mg | ORAL_CAPSULE | ORAL | 0 refills | Status: DC

## 2021-06-11 MED ORDER — SODIUM CHLORIDE 0.9 % IV SOLN
Freq: Once | INTRAVENOUS | Status: AC
  Filled 2021-06-11: qty 250

## 2021-06-11 NOTE — Progress Notes (Signed)
Hematology/Oncology follow up note [facility] [phone] [phone]   Patient Care Team: Pcp, No as PCP - General [name], RN as Oncology Nurse Navigator  REFERRING PROVIDER: No ref. provider found  CHIEF COMPLAINTS/REASON FOR VISIT:  Follow up for rectal cancer treatment  HISTORY OF PRESENTING ILLNESS:   Barry [name]. is a  55 y.o.  male with PMH listed below was seen in consultation at the request of  No ref. provider found  for evaluation of rectal cancer  02/05/2021-02/06/2021 patient was hospitalized due to generalized weakness, intermittent lightheadedness, weight loss and worsening constipation.  02/05/2021 CT abdomen showed concerning of severe rectal wall thickening and right internal iliac lymph node concerning for metastatic disease.  Patient was seen by gastroenterology and had colonoscopy which showed a circumferential fungating mass in the rectum.  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  02/14/2021-02/16/2021 hospitalized due to rectal bleeding and pain.   02/14/2021 CT showed perirectal fluid collection/gas concerning for infection with significant leukocytosis, anemia with hemoglobin of 6.8.  Patient received PRBC transfusion, IV antibiotics.  He underwent an IR guided placement of JP drain into the rectal abscess.  Discharged home with oral Augmentin. 02/20/2021 presented to ER with new skin opening and draining from left gluteus.  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  JP drain has been removed.  Patient was continued on Augmentin.  02/13/2021, PET scan showed locally advanced rectal cancer with billowing of the mesorectum now with low attenuation material that was not present on previous examination with extensive stranding and inflammation.   Bulky RIGHT pelvic sidewall/hypogastric lymph node outside of the mesorectum with stippled calcification measuring 19 mm-no increased metabolic  activity. Small LEFT hypogastric lymph node just peripheral to the internal external bifurcation-SUV 3.2 High RIGHT internal just below or at the internal/common iliac transition, lymph node -SUV 4.8 LEFT high hypogastric lymph node  8 mm-SUV 3. Scattered lymph nodes throughout the retroperitoneum with low FDG uptake Bilateral inguinal lymph nodes largest on the RIGHT (image 248/3) 11 mm with a maximum SUV of 2.8  spiculated nodule in the LEFT upper lobe- 9 x 8 mm  02/14/2021, CT abdomen pelvis without contrast Showed perforated rectal mass with contained perforation with fluid and gas extending above and below the pelvic floor,potentially involving the sphincter complex and extending into LEFT ischial rectal fossa  02/20/2021, CT pelvis with contrast showed Large perirectal/perianal abscess has markedly decreased in size since placement of the percutaneous drain. There are residual gas-filled collections in the soft tissues and suspect there is a fistula or sinus tract between the rectal mass and the subcutaneous tissues. Soft tissue gas along the medial left buttock and concern for a cutaneous ulceration in this area. Large rectal mass with evidence for a large necrotic right pelvic lymph node.   02/27/2021 medi port placed by Dr.Dew 03/13/2021 reports right butt cheek and also left perianal area fullness.he was seen by Dr.Pabon urgently  and had right buttock abscess drained. Left perianal fullness was felt to be due to cancer.   03/13/2021 started [place]  # He was seen by [name] Dr.Mantyh who agrees with TNT followed by surgery. # Emphysema, recommend him to establish care with PCP. He was provided with a list of PCP office contact information.   INTERVAL HISTORY Barry [name]. is a 55 y.o. male who has above history reviewed by me today presents for follow up visit for management of rectal cancer. [name]  #  He has had loose bowel movements episodes. He has stopped Senokot.  Otherwise he has no  new complaints.   Appetite is good.  He is on fentanyl patch and pain is controlled.  .    Review of Systems  Constitutional:  Negative for appetite change, chills, fatigue, fever and unexpected weight change.  HENT:   Negative for hearing loss and voice change.   Eyes:  Negative for eye problems and icterus.  Respiratory:  Negative for chest tightness, cough and shortness of breath.   Cardiovascular:  Negative for chest pain and leg swelling.  Gastrointestinal:  Positive for diarrhea and rectal pain. Negative for abdominal distention, abdominal pain, blood in stool and constipation.  Endocrine: Negative for hot flashes.  Genitourinary:  Negative for difficulty urinating, dysuria and frequency.   Musculoskeletal:  Negative for arthralgias.  Skin:  Negative for itching and rash.  Neurological:  Negative for light-headedness and numbness.  Hematological:  Negative for adenopathy. Does not bruise/bleed easily.  Psychiatric/Behavioral:  Negative for confusion.    MEDICAL HISTORY:  Past Medical History:  Diagnosis Date   Headache    Left shoulder pain    Rectal cancer ([place])     SURGICAL HISTORY: Past Surgical History:  Procedure Laterality Date   COLONOSCOPY WITH PROPOFOL N/A 02/06/2021   Procedure: COLONOSCOPY WITH PROPOFOL;  Surgeon: Lin Landsman, MD;  Location: [facility] ENDOSCOPY;  Service: Gastroenterology;  Laterality: N/A;   PORTA CATH INSERTION N/A 02/27/2021   Procedure: PORTA CATH INSERTION;  Surgeon: Algernon Huxley, MD;  Location: [facility];  Service: Cardiovascular;  Laterality: N/A;   ROTATOR CUFF REPAIR Left     SOCIAL HISTORY: Social History   Socioeconomic History   Marital status: Married    Spouse name: Not on file   Number of children: Not on file   Years of education: Not on file   Highest education level: Not on file  Occupational History   Not on file  Tobacco Use   Smoking status: Former    Packs/day: 1.00    Years: 10.00    Pack years:  10.00    Types: Cigars, Cigarettes    Quit date: 02/04/2021    Years since quitting: 0.3   Smokeless tobacco: Never  Substance and Sexual Activity   Alcohol use: Not Currently   Drug use: No   Sexual activity: Not on file  Other Topics Concern   Not on file  Social History Narrative   Not on file   Social Determinants of Health   Financial Resource Strain: Not on file  Food Insecurity: Not on file  Transportation Needs: Not on file  Physical Activity: Not on file  Stress: Not on file  Social Connections: Not on file  Intimate Partner Violence: Not on file    FAMILY HISTORY: Family History  Problem Relation Age of Onset   Cancer Sister    Diabetes Mother    Cancer Maternal Grandmother    Cancer Paternal Grandmother     ALLERGIES:  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  MEDICATIONS:  Current Outpatient Medications  Medication Sig Dispense Refill   feeding supplement (ENSURE ENLIVE / ENSURE PLUS) LIQD Take 237 mLs by mouth daily. 237 mL 12   fentaNYL (DURAGESIC) 50 MCG/HR Place 1 patch onto the skin every 3 (three) days. 10 patch 0   lidocaine-prilocaine (EMLA) cream Apply to affected area once 30 g 3   loperamide (IMODIUM) 2 MG capsule Take 1 capsule (2 mg total) by mouth  See admin instructions. Take 2 tablets with onset of diarrhea, then 1 tab after every loose bowel movement. Maximum dose 8 tablets within 24 hours. 90 capsule 0   nicotine (NICODERM CQ - DOSED IN MG/24 HOURS) 21 mg/24hr patch Place 1 patch (21 mg total) onto the skin daily. 28 patch 0   ondansetron (ZOFRAN) 8 MG tablet Take 1 tablet (8 mg total) by mouth 2 (two) times daily as needed for refractory nausea / vomiting. Start on day 3 after chemotherapy. 30 tablet 1   prochlorperazine (COMPAZINE) 10 MG tablet Take 1 tablet (10 mg total) by mouth every 6 (six) hours as needed (Nausea or vomiting). 30 tablet 1   senna-docusate (SENOKOT-S) 8.6-50 MG tablet Take 2 tablets by mouth daily. 120 tablet 0    HYDROcodone-acetaminophen (NORCO) 5-325 MG tablet Take 1 tablet by mouth every 6 (six) hours as needed for moderate pain. (Patient not taking: No sig reported) 90 tablet 0   ibuprofen (ADVIL) 400 MG tablet Take 1 tablet (400 mg total) by mouth every 6 (six) hours as needed. (Patient not taking: No sig reported) 30 tablet 0   No current facility-administered medications for this visit.     PHYSICAL EXAMINATION: ECOG PERFORMANCE STATUS: 1 - Symptomatic but completely ambulatory Vitals:   06/11/21 0915  BP: (!) 135/97  Pulse: (!) 110  Resp: 20  Temp: 98.7 F (37.1 C)  SpO2: 100%     Filed Weights   06/11/21 0915  Weight: 156 lb 4.8 oz (70.9 kg)     Physical Exam Constitutional:      Comments: he ambulates independently.  HENT:     Head: Normocephalic and atraumatic.  Eyes:     General: No scleral icterus. Cardiovascular:     Rate and Rhythm: Normal rate and regular rhythm.     Heart sounds: Normal heart sounds.  Pulmonary:     Effort: Pulmonary effort is normal. No respiratory distress.     Breath sounds: No wheezing.     Comments: Decreased breath sound bilaterally.  Abdominal:     General: Bowel sounds are normal. There is no distension.     Palpations: Abdomen is soft.  Musculoskeletal:        General: No deformity. Normal range of motion.     Cervical back: Normal range of motion and neck supple.  Skin:    General: Skin is warm and dry.     Findings: No erythema or rash.  Neurological:     Mental Status: He is alert and oriented to person, place, and time. Mental status is at baseline.     Cranial Nerves: No cranial nerve deficit.     Coordination: Coordination normal.  Psychiatric:        Mood and Affect: Mood normal.    LABORATORY DATA:  I have reviewed the data as listed Lab Results  Component Value Date   WBC 7.1 06/11/2021   HGB 13.8 06/11/2021   HCT 41.4 06/11/2021   MCV 86.6 06/11/2021   PLT 111 (L) 06/11/2021   Recent Labs    05/14/21 0854  05/28/21 0821 06/11/21 0825  NA 134* 135 134*  K 4.1 3.5 3.6  CL 101 101 99  CO2 26 27 26   GLUCOSE 99 188* 145*  BUN 8 9 11   CREATININE 0.91 1.04 0.95  CALCIUM 8.9 8.8* 9.2  GFRNONAA >60 >60 >60  PROT 7.8 7.3 8.3*  ALBUMIN 3.4* 3.4* 3.7  AST 33 37 35  ALT 39 35 29  ALKPHOS 79 81 94  BILITOT 0.5 0.3 0.3    Iron/TIBC/Ferritin/ %Sat    Component Value Date/Time   IRON 28 (L) 03/25/2021 1029   TIBC 356 03/25/2021 1029   FERRITIN 118 03/25/2021 1029   IRONPCTSAT 8 (L) 03/25/2021 1029      RADIOGRAPHIC STUDIES: I have personally reviewed the radiological images as listed and agreed with the findings in the report. No results found.      ASSESSMENT & PLAN:  1. Rectal cancer ([place])   2. Encounter for antineoplastic chemotherapy   3. Port-A-Cath in place   4. Chemotherapy induced diarrhea   5. Thrombocytopenia ([place])   Cancer Staging Rectal cancer [facility]) Staging form: Colon and Rectum, AJCC 8th Edition - Clinical stage from 02/08/2021: Stage IIIB (cT4a, cN1, cM0) - Signed by Earlie Server, MD on 03/06/2021   #locally advanced rectal cancer. His case is complicated with a perirectal abscess. Prior to onset of perirectal abscess, on his 02/05/2021 scan, he was noted to have right internal iliac lymph node 3 x 1 x 2.3 cm which is concerning for nodal disease.On Subsequent images it is difficult to distinguish whether lymphadenopathy was due to nodal disease versus acute inflammation. 03/07/2021 MRI pelvis cT3 N2. Due to the possible contained perforation on previous CT, possible cT4 disease.  He is on TNT neoadjuvant protocol.  Labs are reviewed and discussed with patient. Hold treatment due to diarrhea.    # Diarrhea, likely due to chemotherapy check C diff Tachycardia likely due to dehydration. IVF 1L NS x 1,  PRN imodium, Rx sent.   # Thrombocytopenia, stable.  #Spiculated lung nodule, likely primary lung cancer. Discussed with IR, CT guided biopsy is difficult given the  position and small size. Will monitor for now. His rectal cancer treatment takes priority. Plan to check CT chest after he finishes treatment to access stability  #Iron deficiency anemia Status post IV Venofer treatments, off oral iron supplementation due to constipation.  Hemoglobin 113.8  #Rectal pain, continue fentanyl patch 33mcg Q72 hours, he does not  need any short acting narcotics at this point. Consider dose reduce to 29mcg after he finishes current supply   Follow up in 1 week for lab md FOLFOX All questions were answered. The patient knows to call the clinic with any problems questions or concerns.    Earlie Server, MD, PhD 06/11/2021

## 2021-06-11 NOTE — Progress Notes (Signed)
Patient states he is having diarrhea and has had this problem for the last 4 days. Patient wants to know why his weight keeps going up and down.

## 2021-06-13 ENCOUNTER — Inpatient Hospital Stay: Payer: Self-pay

## 2021-06-17 ENCOUNTER — Encounter: Payer: Self-pay | Admitting: General Surgery

## 2021-06-19 ENCOUNTER — Encounter: Payer: Self-pay | Admitting: Oncology

## 2021-06-19 ENCOUNTER — Inpatient Hospital Stay: Payer: Self-pay

## 2021-06-19 ENCOUNTER — Other Ambulatory Visit: Payer: Self-pay

## 2021-06-19 ENCOUNTER — Inpatient Hospital Stay (HOSPITAL_BASED_OUTPATIENT_CLINIC_OR_DEPARTMENT_OTHER): Payer: Self-pay | Admitting: Oncology

## 2021-06-19 VITALS — BP 127/85 | HR 109 | Temp 96.1°F | Resp 16 | Wt 157.0 lb

## 2021-06-19 VITALS — HR 95

## 2021-06-19 DIAGNOSIS — C20 Malignant neoplasm of rectum: Secondary | ICD-10-CM

## 2021-06-19 DIAGNOSIS — K521 Toxic gastroenteritis and colitis: Secondary | ICD-10-CM

## 2021-06-19 DIAGNOSIS — T451X5A Adverse effect of antineoplastic and immunosuppressive drugs, initial encounter: Secondary | ICD-10-CM

## 2021-06-19 DIAGNOSIS — D696 Thrombocytopenia, unspecified: Secondary | ICD-10-CM

## 2021-06-19 DIAGNOSIS — Z5111 Encounter for antineoplastic chemotherapy: Secondary | ICD-10-CM

## 2021-06-19 DIAGNOSIS — G893 Neoplasm related pain (acute) (chronic): Secondary | ICD-10-CM

## 2021-06-19 DIAGNOSIS — R911 Solitary pulmonary nodule: Secondary | ICD-10-CM

## 2021-06-19 LAB — COMPREHENSIVE METABOLIC PANEL
ALT: 18 U/L (ref 0–44)
AST: 25 U/L (ref 15–41)
Albumin: 3.1 g/dL — ABNORMAL LOW (ref 3.5–5.0)
Alkaline Phosphatase: 89 U/L (ref 38–126)
Anion gap: 7 (ref 5–15)
BUN: 9 mg/dL (ref 6–20)
CO2: 26 mmol/L (ref 22–32)
Calcium: 8.5 mg/dL — ABNORMAL LOW (ref 8.9–10.3)
Chloride: 98 mmol/L (ref 98–111)
Creatinine, Ser: 0.83 mg/dL (ref 0.61–1.24)
GFR, Estimated: >60 mL/min (ref >60)
Glucose, Bld: 186 mg/dL — ABNORMAL HIGH (ref 70–99)
Potassium: 3.6 mmol/L (ref 3.5–5.1)
Sodium: 131 mmol/L — ABNORMAL LOW (ref 135–145)
Total Bilirubin: 0.2 mg/dL — ABNORMAL LOW (ref 0.3–1.2)
Total Protein: 7.4 g/dL (ref 6.5–8.1)

## 2021-06-19 LAB — CBC WITH DIFFERENTIAL/PLATELET
Abs Immature Granulocytes: 0.04 10*3/uL (ref 0.00–0.07)
Basophils Absolute: 0.1 10*3/uL (ref 0.0–0.1)
Basophils Relative: 1 %
Eosinophils Absolute: 0.3 10*3/uL (ref 0.0–0.5)
Eosinophils Relative: 3 %
HCT: 36.4 % — ABNORMAL LOW (ref 39.0–52.0)
Hemoglobin: 12.3 g/dL — ABNORMAL LOW (ref 13.0–17.0)
Immature Granulocytes: 0 %
Lymphocytes Relative: 16 %
Lymphs Abs: 1.6 10*3/uL (ref 0.7–4.0)
MCH: 28.8 pg (ref 26.0–34.0)
MCHC: 33.8 g/dL (ref 30.0–36.0)
MCV: 85.2 fL (ref 80.0–100.0)
Monocytes Absolute: 1.3 10*3/uL — ABNORMAL HIGH (ref 0.1–1.0)
Monocytes Relative: 14 %
Neutro Abs: 6.4 10*3/uL (ref 1.7–7.7)
Neutrophils Relative %: 66 %
Platelets: 197 10*3/uL (ref 150–400)
RBC: 4.27 MIL/uL (ref 4.22–5.81)
RDW: 18.8 % — ABNORMAL HIGH (ref 11.5–15.5)
WBC: 9.7 10*3/uL (ref 4.0–10.5)
nRBC: 0 % (ref 0.0–0.2)

## 2021-06-19 MED ORDER — FLUOROURACIL CHEMO INJECTION 2.5 GM/50ML
400.0000 mg/m2 | Freq: Once | INTRAVENOUS | Status: AC
  Administered 2021-06-19: 750 mg via INTRAVENOUS
  Filled 2021-06-19: qty 15

## 2021-06-19 MED ORDER — SODIUM CHLORIDE 0.9 % IV SOLN
2400.0000 mg/m2 | INTRAVENOUS | Status: DC
  Administered 2021-06-19: 4350 mg via INTRAVENOUS
  Filled 2021-06-19: qty 87

## 2021-06-19 MED ORDER — LEUCOVORIN CALCIUM INJECTION 350 MG
412.0000 mg/m2 | Freq: Once | INTRAVENOUS | Status: AC
  Administered 2021-06-19: 750 mg via INTRAVENOUS
  Filled 2021-06-19: qty 37.5

## 2021-06-19 MED ORDER — SODIUM CHLORIDE 0.9 % IV SOLN
10.0000 mg | Freq: Once | INTRAVENOUS | Status: AC
  Administered 2021-06-19: 10 mg via INTRAVENOUS
  Filled 2021-06-19: qty 10

## 2021-06-19 MED ORDER — FENTANYL 25 MCG/HR TD PT72
1.0000 | MEDICATED_PATCH | TRANSDERMAL | 0 refills | Status: DC
  Filled 2021-06-19: qty 5, 15d supply, fill #0

## 2021-06-19 MED ORDER — DEXTROSE 5 % IV SOLN
Freq: Once | INTRAVENOUS | Status: AC
  Filled 2021-06-19: qty 250

## 2021-06-19 MED ORDER — HEPARIN SOD (PORK) LOCK FLUSH 100 UNIT/ML IV SOLN
500.0000 [IU] | Freq: Once | INTRAVENOUS | Status: DC | PRN
  Filled 2021-06-19: qty 5

## 2021-06-19 MED ORDER — SODIUM CHLORIDE 0.9% FLUSH
10.0000 mL | Freq: Once | INTRAVENOUS | Status: AC
  Administered 2021-06-19: 10 mL via INTRAVENOUS
  Filled 2021-06-19: qty 10

## 2021-06-19 MED ORDER — PALONOSETRON HCL INJECTION 0.25 MG/5ML
0.2500 mg | Freq: Once | INTRAVENOUS | Status: AC
  Administered 2021-06-19: 0.25 mg via INTRAVENOUS
  Filled 2021-06-19: qty 5

## 2021-06-19 MED ORDER — OXALIPLATIN CHEMO INJECTION 100 MG/20ML
82.0000 mg/m2 | Freq: Once | INTRAVENOUS | Status: AC
  Administered 2021-06-19: 150 mg via INTRAVENOUS
  Filled 2021-06-19: qty 20

## 2021-06-19 NOTE — Progress Notes (Signed)
Patient denies new problems/concerns today.   °

## 2021-06-19 NOTE — Progress Notes (Signed)
Hematology/Oncology follow up note [facility] Telephone:([phone] [phone]   Patient Care Team: Pcp, No as PCP - General [name], RN as Oncology Nurse Navigator  REFERRING PROVIDER: No ref. provider found  CHIEF COMPLAINTS/REASON FOR VISIT:  Follow up for rectal cancer treatment  HISTORY OF PRESENTING ILLNESS:   Barry Risdon. is a  55 y.o.  male with PMH listed below was seen in consultation at the request of  No ref. provider found  for evaluation of rectal cancer  02/05/2021-02/06/2021 patient was hospitalized due to generalized weakness, intermittent lightheadedness, weight loss and worsening constipation.  02/05/2021 CT abdomen showed concerning of severe rectal wall thickening and right internal iliac lymph node concerning for metastatic disease.  Patient was seen by gastroenterology and had colonoscopy which showed a circumferential fungating mass in the rectum.  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  02/14/2021-02/16/2021 hospitalized due to rectal bleeding and pain.   02/14/2021 CT showed perirectal fluid collection/gas concerning for infection with significant leukocytosis, anemia with hemoglobin of 6.8.  Patient received PRBC transfusion, IV antibiotics.  He underwent an IR guided placement of JP drain into the rectal abscess.  Discharged home with oral Augmentin. 02/20/2021 presented to ER with new skin opening and draining from left gluteus.  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  JP drain has been removed.  Patient was continued on Augmentin.  02/13/2021, PET scan showed locally advanced rectal cancer with billowing of the mesorectum now with low attenuation material that was not present on previous examination with extensive stranding and inflammation.   Bulky RIGHT pelvic sidewall/hypogastric lymph node outside of the mesorectum with stippled calcification measuring 19 mm-no increased metabolic  activity. Small LEFT hypogastric lymph node just peripheral to the internal external bifurcation-SUV 3.2 High RIGHT internal just below or at the internal/common iliac transition, lymph node -SUV 4.8 LEFT high hypogastric lymph node  8 mm-SUV 3. Scattered lymph nodes throughout the retroperitoneum with low FDG uptake Bilateral inguinal lymph nodes largest on the RIGHT (image 248/3) 11 mm with a maximum SUV of 2.8  spiculated nodule in the LEFT upper lobe- 9 x 8 mm  02/14/2021, CT abdomen pelvis without contrast Showed perforated rectal mass with contained perforation with fluid and gas extending above and below the pelvic floor,potentially involving the sphincter complex and extending into LEFT ischial rectal fossa  02/20/2021, CT pelvis with contrast showed Large perirectal/perianal abscess has markedly decreased in size since placement of the percutaneous drain. There are residual gas-filled collections in the soft tissues and suspect there is a fistula or sinus tract between the rectal mass and the subcutaneous tissues. Soft tissue gas along the medial left buttock and concern for a cutaneous ulceration in this area. Large rectal mass with evidence for a large necrotic right pelvic lymph node.   02/27/2021 medi port placed by Dr.Dew 03/13/2021 reports right butt cheek and also left perianal area fullness.he was seen by Dr.Pabon urgently  and had right buttock abscess drained. Left perianal fullness was felt to be due to cancer.   03/13/2021 started [place]  # He was seen by [name] Dr.Mantyh who agrees with TNT followed by surgery. # Emphysema, recommend him to establish care with PCP. He was provided with a list of PCP office contact information.   INTERVAL HISTORY Barry [name]. is a 55 y.o. male who has above history reviewed by me today presents for follow up visit for management of rectal cancer. Diarrhea has improved  with the use of Imodium.  Denies any abdominal pain. On average he now  has 2-3 loose bowel meant per day. He is currently on fentanyl patch 50 MCG and he would like to try 25 MCG patch No fever, nausea vomiting   Review of Systems  Constitutional:  Negative for appetite change, chills, fatigue, fever and unexpected weight change.  HENT:   Negative for hearing loss and voice change.   Eyes:  Negative for eye problems and icterus.  Respiratory:  Negative for chest tightness, cough and shortness of breath.   Cardiovascular:  Negative for chest pain and leg swelling.  Gastrointestinal:  Positive for diarrhea and rectal pain. Negative for abdominal distention, abdominal pain, blood in stool and constipation.  Endocrine: Negative for hot flashes.  Genitourinary:  Negative for difficulty urinating, dysuria and frequency.   Musculoskeletal:  Negative for arthralgias.  Skin:  Negative for itching and rash.  Neurological:  Negative for light-headedness and numbness.  Hematological:  Negative for adenopathy. Does not bruise/bleed easily.  Psychiatric/Behavioral:  Negative for confusion.    MEDICAL HISTORY:  Past Medical History:  Diagnosis Date   Headache    Left shoulder pain    Rectal cancer ([place])     SURGICAL HISTORY: Past Surgical History:  Procedure Laterality Date   COLONOSCOPY WITH PROPOFOL N/A 02/06/2021   Procedure: COLONOSCOPY WITH PROPOFOL;  Surgeon: Lin Landsman, MD;  [facility];  Service: Gastroenterology;  Laterality: N/A;   PORTA CATH INSERTION N/A 02/27/2021   Procedure: PORTA CATH INSERTION;  Surgeon: Algernon Huxley, MD;  Location: [place] CV LAB;  Service: Cardiovascular;  Laterality: N/A;   ROTATOR CUFF REPAIR Left     SOCIAL HISTORY: Social History   Socioeconomic History   Marital status: Married    Spouse name: Not on file   Number of children: Not on file   Years of education: Not on file   Highest education level: Not on file  Occupational History   Not on file  Tobacco Use   Smoking status: Former     Packs/day: 1.00    Years: 10.00    Pack years: 10.00    Types: Cigars, Cigarettes    Quit date: 02/04/2021    Years since quitting: 0.3   Smokeless tobacco: Never  Substance and Sexual Activity   Alcohol use: Not Currently   Drug use: No   Sexual activity: Not on file  Other Topics Concern   Not on file  Social History Narrative   Not on file   Social Determinants of Health   Financial Resource Strain: Not on file  Food Insecurity: Not on file  Transportation Needs: Not on file  Physical Activity: Not on file  Stress: Not on file  Social Connections: Not on file  Intimate Partner Violence: Not on file    FAMILY HISTORY: Family History  Problem Relation Age of Onset   Cancer Sister    Diabetes Mother    Cancer Maternal Grandmother    Cancer Paternal Grandmother     ALLERGIES:  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  MEDICATIONS:  Current Outpatient Medications  Medication Sig Dispense Refill   feeding supplement (ENSURE ENLIVE / ENSURE PLUS) LIQD Take 237 mLs by mouth daily. 237 mL 12   fentaNYL (DURAGESIC) 25 MCG/HR Place 1 patch onto the skin every 3 (three) days. 5 patch 0   lidocaine-prilocaine (EMLA) cream Apply to affected area once 30 g 3   loperamide (IMODIUM) 2 MG capsule Take 1  capsule (2 mg total) by mouth See admin instructions. Take 2 tablets with onset of diarrhea, then 1 tab after every loose bowel movement. Maximum dose 8 tablets within 24 hours. 90 capsule 0   nicotine (NICODERM CQ - DOSED IN MG/24 HOURS) 21 mg/24hr patch Place 1 patch (21 mg total) onto the skin daily. 28 patch 0   ondansetron (ZOFRAN) 8 MG tablet Take 1 tablet (8 mg total) by mouth 2 (two) times daily as needed for refractory nausea / vomiting. Start on day 3 after chemotherapy. 30 tablet 1   prochlorperazine (COMPAZINE) 10 MG tablet Take 1 tablet (10 mg total) by mouth every 6 (six) hours as needed (Nausea or vomiting). 30 tablet 1   ibuprofen (ADVIL) 400 MG tablet Take 1 tablet (400 mg  total) by mouth every 6 (six) hours as needed. (Patient not taking: No sig reported) 30 tablet 0   senna-docusate (SENOKOT-S) 8.6-50 MG tablet Take 2 tablets by mouth daily. (Patient not taking: Reported on 06/19/2021) 120 tablet 0   No current facility-administered medications for this visit.   Facility-Administered Medications Ordered in Other Visits  Medication Dose Route Frequency Provider Last Rate Last Admin   fluorouracil (ADRUCIL) 4,350 mg in sodium chloride 0.9 % 63 mL chemo infusion  2,400 mg/m2 (Treatment Plan Recorded) Intravenous 1 day or 1 dose Barry Server, MD   4,350 mg at 06/19/21 1305   heparin lock flush 100 unit/mL  500 Units Intracatheter Once PRN Barry Server, MD         PHYSICAL EXAMINATION: ECOG PERFORMANCE STATUS: 1 - Symptomatic but completely ambulatory Vitals:   06/19/21 0852  BP: 127/85  Pulse: (!) 109  Resp: 16  Temp: (!) 96.1 F (35.6 C)     Filed Weights   06/19/21 0852  Weight: 157 lb (71.2 kg)     Physical Exam Constitutional:      Comments: he ambulates independently.  HENT:     Head: Normocephalic and atraumatic.  Eyes:     General: No scleral icterus. Cardiovascular:     Rate and Rhythm: Normal rate and regular rhythm.     Heart sounds: Normal heart sounds.  Pulmonary:     Effort: Pulmonary effort is normal. No respiratory distress.     Breath sounds: No wheezing.     Comments: Decreased breath sound bilaterally.  Abdominal:     General: Bowel sounds are normal. There is no distension.     Palpations: Abdomen is soft.  Musculoskeletal:        General: No deformity. Normal range of motion.     Cervical back: Normal range of motion and neck supple.  Skin:    General: Skin is warm and dry.     Findings: No erythema or rash.  Neurological:     Mental Status: He is alert and oriented to person, place, and time. Mental status is at baseline.     Cranial Nerves: No cranial nerve deficit.     Coordination: Coordination normal.   Psychiatric:        Mood and Affect: Mood normal.    LABORATORY DATA:  I have reviewed the data as listed Lab Results  Component Value Date   WBC 9.7 06/19/2021   HGB 12.3 (L) 06/19/2021   HCT 36.4 (L) 06/19/2021   MCV 85.2 06/19/2021   PLT 197 06/19/2021   Recent Labs    05/28/21 0821 06/11/21 0825 06/19/21 0829  NA 135 134* 131*  K 3.5 3.6 3.6  CL 101  99 98  CO2 27 26 26   GLUCOSE 188* 145* 186*  BUN 9 11 9   CREATININE 1.04 0.95 0.83  CALCIUM 8.8* 9.2 8.5*  GFRNONAA >60 >60 >60  PROT 7.3 8.3* 7.4  ALBUMIN 3.4* 3.7 3.1*  AST 37 35 25  ALT 35 29 18  ALKPHOS 81 94 89  BILITOT 0.3 0.3 0.2*    Iron/TIBC/Ferritin/ %Sat    Component Value Date/Time   IRON 28 (L) 03/25/2021 1029   TIBC 356 03/25/2021 1029   FERRITIN 118 03/25/2021 1029   IRONPCTSAT 8 (L) 03/25/2021 1029      RADIOGRAPHIC STUDIES: I have personally reviewed the radiological images as listed and agreed with the findings in the report. No results found.      ASSESSMENT & PLAN:  1. Encounter for antineoplastic chemotherapy   2. Rectal cancer ([place])   3. Chemotherapy induced diarrhea   4. Thrombocytopenia ([place])   5. Neoplasm related pain   6. Lung nodule   Cancer Staging Rectal cancer [facility]) Staging form: Colon and Rectum, AJCC 8th Edition - Clinical stage from 02/08/2021: Stage IIIB (cT4a, cN1, cM0) - Signed by Barry Server, MD on 03/06/2021   #locally advanced rectal cancer. His case is complicated with a perirectal abscess. Prior to onset of perirectal abscess, on his 02/05/2021 scan, he was noted to have right internal iliac lymph node 3 x 1 x 2.3 cm which is concerning for nodal disease.On Subsequent images it is difficult to distinguish whether lymphadenopathy was due to nodal disease versus acute inflammation. 03/07/2021 MRI pelvis cT3 N2. Due to the possible contained perforation on previous CT, possible cT4 disease.  He is on TNT neoadjuvant protocol.  Labs are reviewed and discussed with  patient Proceed with cycle 7 FOLFOX  # Diarrhea, likely due to chemotherapy negative C diff.  Hold laxatives. Continue as needed Imodium as instructed. Tachycardia, patient asymptomatic.  Encourage oral hydration.  # Thrombocytopenia, stable.  #Spiculated lung nodule, likely primary lung cancer. Discussed with IR, CT guided biopsy is difficult given the position and small size. Will monitor for now. His rectal cancer treatment takes priority. Plan to check CT chest after he finishes treatment to access stability  #Iron deficiency anemia Status post IV Venofer treatments, off oral iron supplementation due to constipation.  Hemoglobin 12.3  #Rectal pain, dose reduce to 30mcg fentanyl patch patch.  Prescription sent to pharmacy. Refer to radiation oncology for evaluation of concurrent chemoradiation.  Follow up in 2 weeks for lab md FOLFOX All questions were answered. The patient knows to call the clinic with any problems questions or concerns.    Barry Server, MD, PhD 06/19/2021

## 2021-06-20 ENCOUNTER — Other Ambulatory Visit (HOSPITAL_COMMUNITY): Payer: Self-pay

## 2021-06-21 ENCOUNTER — Other Ambulatory Visit (HOSPITAL_COMMUNITY): Payer: Self-pay

## 2021-06-21 ENCOUNTER — Inpatient Hospital Stay: Payer: Self-pay

## 2021-06-21 ENCOUNTER — Telehealth: Payer: Self-pay | Admitting: Pharmacy Technician

## 2021-06-21 ENCOUNTER — Other Ambulatory Visit: Payer: Self-pay

## 2021-06-21 DIAGNOSIS — C20 Malignant neoplasm of rectum: Secondary | ICD-10-CM

## 2021-06-21 MED ORDER — SODIUM CHLORIDE 0.9% FLUSH
10.0000 mL | INTRAVENOUS | Status: DC | PRN
  Administered 2021-06-21: 10 mL
  Filled 2021-06-21: qty 10

## 2021-06-21 MED ORDER — HEPARIN SOD (PORK) LOCK FLUSH 100 UNIT/ML IV SOLN
500.0000 [IU] | Freq: Once | INTRAVENOUS | Status: AC | PRN
  Administered 2021-06-21: 500 [IU]
  Filled 2021-06-21: qty 5

## 2021-06-21 NOTE — Telephone Encounter (Signed)
Oral Oncology Patient Advocate Encounter  After completing a benefits investigation, prior authorization for Xeloda will not be needed since patient is currently uninsured.    Patient was approved for the [employer], per Ulice Dash, and it will be used to cover the cost of the Xeloda at [facility].    [place] Patient [place] Phone [phone] Fax [phone] 06/21/2021 12:16 PM

## 2021-06-26 ENCOUNTER — Encounter: Payer: Self-pay | Admitting: Radiation Oncology

## 2021-06-26 ENCOUNTER — Ambulatory Visit
Admission: RE | Admit: 2021-06-26 | Discharge: 2021-06-26 | Disposition: A | Discharge status: Home / Self Care | Payer: Self-pay | Source: Ambulatory Visit | Attending: Radiation Oncology | Admitting: Radiation Oncology

## 2021-06-26 ENCOUNTER — Other Ambulatory Visit: Payer: Self-pay

## 2021-06-26 VITALS — BP 149/102 | HR 108 | Temp 94.8°F | Resp 16 | Wt 152.0 lb

## 2021-06-26 DIAGNOSIS — Z809 Family history of malignant neoplasm, unspecified: Secondary | ICD-10-CM | POA: Insufficient documentation

## 2021-06-26 DIAGNOSIS — Z79899 Other long term (current) drug therapy: Secondary | ICD-10-CM | POA: Insufficient documentation

## 2021-06-26 DIAGNOSIS — Z87891 Personal history of nicotine dependence: Secondary | ICD-10-CM | POA: Insufficient documentation

## 2021-06-26 DIAGNOSIS — C20 Malignant neoplasm of rectum: Secondary | ICD-10-CM | POA: Insufficient documentation

## 2021-06-26 DIAGNOSIS — R911 Solitary pulmonary nodule: Secondary | ICD-10-CM | POA: Insufficient documentation

## 2021-06-26 NOTE — Consult Note (Signed)
NEW PATIENT EVALUATION  Name: Barry [name].  MRN: [id number]  Date:   06/26/2021     DOB: [birth year]   This 55 y.o. 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  REFERRING PHYSICIAN: Earlie Server, MD  CHIEF COMPLAINT:  Chief Complaint  Patient presents with   Cancer    Initial consultation    DIAGNOSIS: The encounter diagnosis was Rectal cancer [facility]).   PREVIOUS INVESTIGATIONS:  PET scan MRI scans reviewed Pathology report reviewed Clinical notes reviewed  HPI: Patient is a 55 year old male who was originally hospitalized for an generalized weakness weight loss and worsening constipation.  In May 2022 CT scan showed severe rectal wall thickening right iliac lymph nodes concerning for metastatic disease.  Colonoscopy showed a circumferential mass in the rectum biopsy positive for moderately differentiated adenocarcinoma.  In June CT scan showed perirectal fluid collection concerning for infection with significant leukocytosis and a hemoglobin of 6.8.  He received transfusions.  He developed a skin opening with draining from the left gluteus consistent with infection and CT scan demonstrated again a fistula arising from the rectal mass.  PET CT scan performed in June showed locally advanced rectal cancer with ballooning of the mesorectum with extensive stranding and inflammation suspicious for rectal compromise.  There is also right pelvic sidewall lymph nodes and small lymph nodes consistent with metastatic disease.  No other evidence of metastatic disease was noted.  MRI also in June showed a T3DN2 lesion.  The tumor shows involvement of the anal sphincter.  He went for second opinion at [facility] and they recommend I TNT therapy.  He has been started on FOLFOX has his last cycle next week.  Patient does have an spiculated lung nodule which is being followed.  He is seen  today for radiation oncology opinion he is doing fairly well he states he is having some rectal discomfort and diarrhea and occasional bleeding per rectum.  PLANNED TREATMENT REGIMEN: Concurrent chemotherapy and radiation therapy in a neoadjuvant setting  PAST MEDICAL HISTORY:  has a past medical history of Headache, Left shoulder pain, and Rectal cancer ([place]).    PAST SURGICAL HISTORY:  Past Surgical History:  Procedure Laterality Date   COLONOSCOPY WITH PROPOFOL N/A 02/06/2021   Procedure: COLONOSCOPY WITH PROPOFOL;  Surgeon: Lin Landsman, MD;  Location[facility] ENDOSCOPY;  Service: Gastroenterology;  Laterality: N/A;   PORTA CATH INSERTION N/A 02/27/2021   Procedure: PORTA CATH INSERTION;  Surgeon: Algernon Huxley, MD;  Location: [place] CV LAB;  Service: Cardiovascular;  Laterality: N/A;   ROTATOR CUFF REPAIR Left     FAMILY HISTORY: family history includes Cancer in his maternal grandmother, paternal grandmother, and sister; Diabetes in his mother.  SOCIAL HISTORY:  reports that he quit smoking about 4 months ago. His smoking use included cigars and cigarettes. He has a 10.00 pack-year smoking history. He has never used smokeless tobacco. He reports that he does not currently use alcohol. He reports that he does not use drugs.  ALLERGIES: Shellfish allergy  MEDICATIONS:  Current Outpatient Medications  Medication Sig Dispense Refill   feeding supplement (ENSURE ENLIVE / ENSURE PLUS) LIQD Take 237 mLs by mouth daily. 237 mL 12   fentaNYL (DURAGESIC) 25 MCG/HR Place 1 patch onto the skin every 3 (three) days. 5 patch 0   lidocaine-prilocaine (EMLA) cream Apply to affected area once 30 g 3  loperamide (IMODIUM) 2 MG capsule Take 1 capsule (2 mg total) by mouth See admin instructions. Take 2 tablets with onset of diarrhea, then 1 tab after every loose bowel movement. Maximum dose 8 tablets within 24 hours. 90 capsule 0   ondansetron (ZOFRAN) 8 MG tablet Take 1 tablet (8 mg total)  by mouth 2 (two) times daily as needed for refractory nausea / vomiting. Start on day 3 after chemotherapy. 30 tablet 1   prochlorperazine (COMPAZINE) 10 MG tablet Take 1 tablet (10 mg total) by mouth every 6 (six) hours as needed (Nausea or vomiting). 30 tablet 1   ibuprofen (ADVIL) 400 MG tablet Take 1 tablet (400 mg total) by mouth every 6 (six) hours as needed. (Patient not taking: No sig reported) 30 tablet 0   nicotine (NICODERM CQ - DOSED IN MG/24 HOURS) 21 mg/24hr patch Place 1 patch (21 mg total) onto the skin daily. 28 patch 0   senna-docusate (SENOKOT-S) 8.6-50 MG tablet Take 2 tablets by mouth daily. (Patient not taking: No sig reported) 120 tablet 0   No current facility-administered medications for this encounter.    ECOG PERFORMANCE STATUS:  1 - Symptomatic but completely ambulatory  REVIEW OF SYSTEMS: Patient denies any weight loss, fatigue, weakness, fever, chills or night sweats. Patient denies any loss of vision, blurred vision. Patient denies any ringing  of the ears or hearing loss. No irregular heartbeat. Patient denies heart murmur or history of fainting. Patient denies any chest pain or pain radiating to her upper extremities. Patient denies any shortness of breath, difficulty breathing at night, cough or hemoptysis. Patient denies any swelling in the lower legs. Patient denies any nausea vomiting, vomiting of blood, or coffee ground material in the vomitus. Patient denies any stomach pain. Patient states has had normal bowel movements no significant constipation or diarrhea. Patient denies any dysuria, hematuria or significant nocturia. Patient denies any problems walking, swelling in the joints or loss of balance. Patient denies any skin changes, loss of hair or loss of weight. Patient denies any excessive worrying or anxiety or significant depression. Patient denies any problems with insomnia. Patient denies excessive thirst, polyuria, polydipsia. Patient denies any swollen  glands, patient denies easy bruising or easy bleeding. Patient denies any recent infections, allergies or URI. Patient "s visual fields have not changed significantly in recent time.   PHYSICAL EXAM: BP (!) 149/102 (BP Location: Left Arm, Patient Position: Sitting)   Pulse (!) 108   Temp (!) 94.8 F (34.9 C)   Resp 16   Wt 152 lb (68.9 kg)   BMI 23.11 kg/m  Well-developed well-nourished patient in NAD. HEENT reveals PERLA, EOMI, discs not visualized.  Oral cavity is clear. No oral mucosal lesions are identified. Neck is clear without evidence of cervical or supraclavicular adenopathy. Lungs are clear to A&P. Cardiac examination is essentially unremarkable with regular rate and rhythm without murmur rub or thrill. Abdomen is benign with no organomegaly or masses noted. Motor sensory and DTR levels are equal and symmetric in the upper and lower extremities. Cranial nerves II through XII are grossly intact. Proprioception is intact. No peripheral adenopathy or edema is identified. No motor or sensory levels are noted. Crude visual fields are within normal range.  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  We will plan on delivering 45 Gray over 5 weeks to his pelvis.  May boost another 540 centigrade to his rectal mass if we can avoid significant small intestinal toxicity.  This will be given with concurrent oral Xeloda.  Risks and benefits of radiation therapy including possible increased diarrhea increased lower urinary tract symptoms skin  reaction fatigue alteration of blood counts all were discussed in detail with the patient.  He comprehends my recommendations well.  I personally set up and ordered CT simulation for later next week we will plan on delivering radiation starting about 2 weeks out from his final FOLFOX chemotherapy.  I would like to take this opportunity to thank you for allowing me to participate in the care of your patient.Noreene Filbert, MD

## 2021-06-28 ENCOUNTER — Other Ambulatory Visit: Payer: Self-pay

## 2021-07-01 ENCOUNTER — Encounter: Payer: Self-pay | Admitting: Oncology

## 2021-07-03 ENCOUNTER — Inpatient Hospital Stay: Payer: Self-pay

## 2021-07-03 ENCOUNTER — Inpatient Hospital Stay (HOSPITAL_BASED_OUTPATIENT_CLINIC_OR_DEPARTMENT_OTHER): Payer: Self-pay | Admitting: Oncology

## 2021-07-03 ENCOUNTER — Encounter: Payer: Self-pay | Admitting: Oncology

## 2021-07-03 ENCOUNTER — Other Ambulatory Visit: Payer: Self-pay

## 2021-07-03 ENCOUNTER — Other Ambulatory Visit (HOSPITAL_COMMUNITY): Payer: Self-pay

## 2021-07-03 ENCOUNTER — Telehealth: Payer: Self-pay | Admitting: Pharmacist

## 2021-07-03 VITALS — HR 99

## 2021-07-03 VITALS — BP 123/84 | HR 104 | Temp 97.9°F | Wt 156.4 lb

## 2021-07-03 DIAGNOSIS — D696 Thrombocytopenia, unspecified: Secondary | ICD-10-CM

## 2021-07-03 DIAGNOSIS — G893 Neoplasm related pain (acute) (chronic): Secondary | ICD-10-CM

## 2021-07-03 DIAGNOSIS — Z95828 Presence of other vascular implants and grafts: Secondary | ICD-10-CM

## 2021-07-03 DIAGNOSIS — C20 Malignant neoplasm of rectum: Secondary | ICD-10-CM

## 2021-07-03 DIAGNOSIS — Z5111 Encounter for antineoplastic chemotherapy: Secondary | ICD-10-CM

## 2021-07-03 LAB — CBC WITH DIFFERENTIAL/PLATELET
Abs Immature Granulocytes: 0.01 10*3/uL (ref 0.00–0.07)
Basophils Absolute: 0 10*3/uL (ref 0.0–0.1)
Basophils Relative: 1 %
Eosinophils Absolute: 0.5 10*3/uL (ref 0.0–0.5)
Eosinophils Relative: 10 %
HCT: 35.4 % — ABNORMAL LOW (ref 39.0–52.0)
Hemoglobin: 12.1 g/dL — ABNORMAL LOW (ref 13.0–17.0)
Immature Granulocytes: 0 %
Lymphocytes Relative: 35 %
Lymphs Abs: 1.9 10*3/uL (ref 0.7–4.0)
MCH: 29.3 pg (ref 26.0–34.0)
MCHC: 34.2 g/dL (ref 30.0–36.0)
MCV: 85.7 fL (ref 80.0–100.0)
Monocytes Absolute: 1 10*3/uL (ref 0.1–1.0)
Monocytes Relative: 20 %
Neutro Abs: 1.7 10*3/uL (ref 1.7–7.7)
Neutrophils Relative %: 34 %
Platelets: 169 10*3/uL (ref 150–400)
RBC: 4.13 MIL/uL — ABNORMAL LOW (ref 4.22–5.81)
RDW: 18.4 % — ABNORMAL HIGH (ref 11.5–15.5)
WBC: 5.2 10*3/uL (ref 4.0–10.5)
nRBC: 0 % (ref 0.0–0.2)

## 2021-07-03 LAB — COMPREHENSIVE METABOLIC PANEL
ALT: 24 U/L (ref 0–44)
AST: 26 U/L (ref 15–41)
Albumin: 3.2 g/dL — ABNORMAL LOW (ref 3.5–5.0)
Alkaline Phosphatase: 100 U/L (ref 38–126)
Anion gap: 8 (ref 5–15)
BUN: 14 mg/dL (ref 6–20)
CO2: 26 mmol/L (ref 22–32)
Calcium: 8.9 mg/dL (ref 8.9–10.3)
Chloride: 100 mmol/L (ref 98–111)
Creatinine, Ser: 0.92 mg/dL (ref 0.61–1.24)
GFR, Estimated: >60 mL/min (ref >60)
Glucose, Bld: 172 mg/dL — ABNORMAL HIGH (ref 70–99)
Potassium: 3.8 mmol/L (ref 3.5–5.1)
Sodium: 134 mmol/L — ABNORMAL LOW (ref 135–145)
Total Bilirubin: 0.3 mg/dL (ref 0.3–1.2)
Total Protein: 7.8 g/dL (ref 6.5–8.1)

## 2021-07-03 MED ORDER — OXALIPLATIN CHEMO INJECTION 100 MG/20ML
150.0000 mg | Freq: Once | INTRAVENOUS | Status: AC
  Administered 2021-07-03: 150 mg via INTRAVENOUS
  Filled 2021-07-03: qty 20

## 2021-07-03 MED ORDER — CAPECITABINE 500 MG PO TABS
825.0000 mg/m2 | ORAL_TABLET | Freq: Two times a day (BID) | ORAL | 0 refills | Status: DC
  Filled 2021-07-03: qty 168, 28d supply, fill #0
  Filled 2021-07-08: qty 168, 38d supply, fill #0

## 2021-07-03 MED ORDER — CAPECITABINE 500 MG PO TABS: 825.0000 mg/m2 | ORAL_TABLET | Freq: Two times a day (BID) | ORAL | 0 refills | Status: DC

## 2021-07-03 MED ORDER — SODIUM CHLORIDE 0.9 % IV SOLN
2400.0000 mg/m2 | INTRAVENOUS | Status: DC
  Administered 2021-07-03: 4350 mg via INTRAVENOUS
  Filled 2021-07-03 (×2): qty 87

## 2021-07-03 MED ORDER — PALONOSETRON HCL INJECTION 0.25 MG/5ML
0.2500 mg | Freq: Once | INTRAVENOUS | Status: AC
  Administered 2021-07-03: 0.25 mg via INTRAVENOUS
  Filled 2021-07-03: qty 5

## 2021-07-03 MED ORDER — SODIUM CHLORIDE 0.9 % IV SOLN
10.0000 mg | Freq: Once | INTRAVENOUS | Status: AC
  Administered 2021-07-03: 10 mg via INTRAVENOUS
  Filled 2021-07-03: qty 10

## 2021-07-03 MED ORDER — LEUCOVORIN CALCIUM INJECTION 350 MG
750.0000 mg | Freq: Once | INTRAVENOUS | Status: AC
  Administered 2021-07-03: 750 mg via INTRAVENOUS
  Filled 2021-07-03: qty 37.5

## 2021-07-03 MED ORDER — DEXTROSE 5 % IV SOLN
Freq: Once | INTRAVENOUS | Status: AC
  Filled 2021-07-03: qty 250

## 2021-07-03 MED ORDER — HEPARIN SOD (PORK) LOCK FLUSH 100 UNIT/ML IV SOLN
500.0000 [IU] | Freq: Once | INTRAVENOUS | Status: DC
  Filled 2021-07-03: qty 5

## 2021-07-03 MED ORDER — SODIUM CHLORIDE 0.9% FLUSH
10.0000 mL | INTRAVENOUS | Status: DC | PRN
  Administered 2021-07-03: 10 mL via INTRAVENOUS
  Filled 2021-07-03: qty 10

## 2021-07-03 MED ORDER — FLUOROURACIL CHEMO INJECTION 2.5 GM/50ML
400.0000 mg/m2 | Freq: Once | INTRAVENOUS | Status: AC
  Administered 2021-07-03: 750 mg via INTRAVENOUS
  Filled 2021-07-03: qty 15

## 2021-07-03 NOTE — Progress Notes (Signed)
Nutrition Follow-up:   Patient with rectal cancer on folfox. Planning to start radiation and xeloda.   Met with patient during infusion.  Patient reports that appetite is doing good, eating well.  Patient out of ensure, unable to find in store and expensive.  Reports that he is eating good source of protein.      Medications: reviewed  Labs: reviewed  Anthropometrics:   Weight 156 lb 6.4 oz today  158 lb on 9/6 158 lb on 8/22 149 lb on 6/22   NUTRITION DIAGNOSIS: Inadequate oral intake improved   INTERVENTION:  Complimentary case of ensure enlive given to patient.  Patient to continue drinking for added nutrition Continue eating high calorie, high protein foods to keep weight stable during treatment    MONITORING, EVALUATION, GOAL: weight trends, intake   NEXT VISIT: to be determined  Barry [name] B. Zenia Resides, [place], [place] Registered Dietitian [phone] (mobile)

## 2021-07-03 NOTE — Patient Instructions (Signed)
[facility]  Discharge Instructions: Thank you for choosing [facility] to provide your oncology and hematology care.  If you have a lab appointment with the [facility], please go directly to the [facility] and check in at the registration area.  Wear comfortable clothing and clothing appropriate for easy access to any Portacath or PICC line.   We strive to give you quality time with your provider. You may need to reschedule your appointment if you arrive late (15 or more minutes).  Arriving late affects you and other patients whose appointments are after yours.  Also, if you miss three or more appointments without notifying the office, you may be dismissed from the clinic at the provider's discretion.      For prescription refill requests, have your pharmacy contact our office and allow 72 hours for refills to be completed.    Today you received the following chemotherapy and/or immunotherapy agents oxaliplatin & adrucil    To help prevent nausea and vomiting after your treatment, we encourage you to take your nausea medication as directed.  BELOW ARE SYMPTOMS THAT SHOULD BE REPORTED IMMEDIATELY: *FEVER GREATER THAN 100.4 F (38 C) OR HIGHER *CHILLS OR SWEATING *NAUSEA AND VOMITING THAT IS NOT CONTROLLED WITH YOUR NAUSEA MEDICATION *UNUSUAL SHORTNESS OF BREATH *UNUSUAL BRUISING OR BLEEDING *URINARY PROBLEMS (pain or burning when urinating, or frequent urination) *BOWEL PROBLEMS (unusual diarrhea, constipation, pain near the anus) TENDERNESS IN MOUTH AND THROAT WITH OR WITHOUT PRESENCE OF ULCERS (sore throat, sores in mouth, or a toothache) UNUSUAL RASH, SWELLING OR PAIN  UNUSUAL VAGINAL DISCHARGE OR ITCHING   Items with * indicate a potential emergency and should be followed up as soon as possible or go to the Emergency Department if any problems should occur.  Please show the CHEMOTHERAPY ALERT CARD or IMMUNOTHERAPY ALERT CARD at  check-in to the Emergency Department and triage nurse.  Should you have questions after your visit or need to cancel or reschedule your appointment, please contact [facility]  [phone] and follow the prompts.  Office hours are 8:00 a.m. to 4:30 p.m. Monday - Friday. Please note that voicemails left after 4:00 p.m. may not be returned until the following business day.  We are closed weekends and major holidays. You have access to a nurse at all times for urgent questions. Please call the main number to the clinic [phone] and follow the prompts.  For any non-urgent questions, you may also contact your provider using MyChart. We now offer e-Visits for anyone 61 and older to request care online for non-urgent symptoms. For details visit [email or web address].   Also download the MyChart app! Go to the app store, search "MyChart", open the app, select [facility], and log in with your MyChart username and password.  Due to Covid, a mask is required upon entering the hospital/clinic. If you do not have a mask, one will be given to you upon arrival. For doctor visits, patients may have 1 support person aged 65 or older with them. For treatment visits, patients cannot have anyone with them due to current Covid guidelines and our immunocompromised population.

## 2021-07-03 NOTE — Telephone Encounter (Signed)
Oral Oncology Pharmacist Encounter  Received new prescription for Xeloda (capecitabine) for the treatment of locally advance rectal cancer in conjunction with XRT, planned duration until the end of XRT.  CMP from 07/03/21 assessed, no relevant lab abnormalities. Prescription dose and frequency assessed.   Current medication list in Epic reviewed, no relevant DDIs with capecitabine identified.  Evaluated chart and one patient barriers to medication adherence identified. Patient is currently uninsured, but he has funds through the cancer center to cover the cost of his capecitabine  Prescription has been e-scribed to the [employer] for benefits analysis and approval.  Oral Oncology Clinic will continue to follow for insurance authorization, copayment issues, initial counseling and start date.   Darl Pikes, PharmD, BCPS, BCOP, CPP Hematology/Oncology Clinical Pharmacist Practitioner [facility] [phone]  07/03/2021 1:26 PM

## 2021-07-03 NOTE — Progress Notes (Signed)
Hematology/Oncology follow up note [facility] Telephone:([phone] [phone]   Patient Care Team: Pcp, No as PCP - General [name], RN as Oncology Nurse Navigator  REFERRING PROVIDER: No ref. provider found  CHIEF COMPLAINTS/REASON FOR VISIT:  Follow up for rectal cancer treatment  HISTORY OF PRESENTING ILLNESS:   Barry Formica. is a  55 y.o.  male with PMH listed below was seen in consultation at the request of  No ref. provider found  for evaluation of rectal cancer  02/05/2021-02/06/2021 patient was hospitalized due to generalized weakness, intermittent lightheadedness, weight loss and worsening constipation.  02/05/2021 CT abdomen showed concerning of severe rectal wall thickening and right internal iliac lymph node concerning for metastatic disease.  Patient was seen by gastroenterology and had colonoscopy which showed a circumferential fungating mass in the rectum.  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  02/14/2021-02/16/2021 hospitalized due to rectal bleeding and pain.   02/14/2021 CT showed perirectal fluid collection/gas concerning for infection with significant leukocytosis, anemia with hemoglobin of 6.8.  Patient received PRBC transfusion, IV antibiotics.  He underwent an IR guided placement of JP drain into the rectal abscess.  Discharged home with oral Augmentin. 02/20/2021 presented to ER with new skin opening and draining from left gluteus.  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  JP drain has been removed.  Patient was continued on Augmentin.  02/13/2021, PET scan showed locally advanced rectal cancer with billowing of the mesorectum now with low attenuation material that was not present on previous examination with extensive stranding and inflammation.   Bulky RIGHT pelvic sidewall/hypogastric lymph node outside of the mesorectum with stippled calcification measuring 19 mm-no increased metabolic  activity. Small LEFT hypogastric lymph node just peripheral to the internal external bifurcation-SUV 3.2 High RIGHT internal just below or at the internal/common iliac transition, lymph node -SUV 4.8 LEFT high hypogastric lymph node  8 mm-SUV 3. Scattered lymph nodes throughout the retroperitoneum with low FDG uptake Bilateral inguinal lymph nodes largest on the RIGHT (image 248/3) 11 mm with a maximum SUV of 2.8  spiculated nodule in the LEFT upper lobe- 9 x 8 mm  02/14/2021, CT abdomen pelvis without contrast Showed perforated rectal mass with contained perforation with fluid and gas extending above and below the pelvic floor,potentially involving the sphincter complex and extending into LEFT ischial rectal fossa  02/20/2021, CT pelvis with contrast showed Large perirectal/perianal abscess has markedly decreased in size since placement of the percutaneous drain. There are residual gas-filled collections in the soft tissues and suspect there is a fistula or sinus tract between the rectal mass and the subcutaneous tissues. Soft tissue gas along the medial left buttock and concern for a cutaneous ulceration in this area. Large rectal mass with evidence for a large necrotic right pelvic lymph node.   02/27/2021 medi port placed by Dr.Dew 03/13/2021 reports right butt cheek and also left perianal area fullness.he was seen by Dr.Pabon urgently  and had right buttock abscess drained. Left perianal fullness was felt to be due to cancer.   03/13/2021 started [facility]  # He was seen by [name] Dr.Mantyh who agrees with TNT followed by surgery. # Emphysema, recommend him to establish care with PCP. He was provided with a list of PCP office contact information.   INTERVAL HISTORY Barry Oats. is a 55 y.o. male who has above history reviewed by me today presents for follow up visit for management of rectal cancer. Patient has establish  care with radiation oncology, 07/08/2021, simulation. He reports feeling  well today.  No nausea vomiting.  Diarrhea is controlled.  Rectal pain is controlled with fentanyl patch 25 MCG   Review of Systems  Constitutional:  Negative for appetite change, chills, fatigue, fever and unexpected weight change.  HENT:   Negative for hearing loss and voice change.   Eyes:  Negative for eye problems and icterus.  Respiratory:  Negative for chest tightness, cough and shortness of breath.   Cardiovascular:  Negative for chest pain and leg swelling.  Gastrointestinal:  Positive for rectal pain. Negative for abdominal distention, abdominal pain, blood in stool, constipation and diarrhea.  Endocrine: Negative for hot flashes.  Genitourinary:  Negative for difficulty urinating, dysuria and frequency.   Musculoskeletal:  Negative for arthralgias.  Skin:  Negative for itching and rash.  Neurological:  Negative for light-headedness and numbness.  Hematological:  Negative for adenopathy. Does not bruise/bleed easily.  Psychiatric/Behavioral:  Negative for confusion.    MEDICAL HISTORY:  Past Medical History:  Diagnosis Date   Headache    Left shoulder pain    Rectal cancer ([place])     SURGICAL HISTORY: Past Surgical History:  Procedure Laterality Date   COLONOSCOPY WITH PROPOFOL N/A 02/06/2021   Procedure: COLONOSCOPY WITH PROPOFOL;  Surgeon: Lin Landsman, MD;  Location: [facility] ENDOSCOPY;  Service: Gastroenterology;  Laterality: N/A;   PORTA CATH INSERTION N/A 02/27/2021   Procedure: PORTA CATH INSERTION;  Surgeon: Algernon Huxley, MD;  Location: [facility];  Service: Cardiovascular;  Laterality: N/A;   ROTATOR CUFF REPAIR Left     SOCIAL HISTORY: Social History   Socioeconomic History   Marital status: Married    Spouse name: Not on file   Number of children: Not on file   Years of education: Not on file   Highest education level: Not on file  Occupational History   Not on file  Tobacco Use   Smoking status: Former    Packs/day: 1.00    Years: 10.00     Pack years: 10.00    Types: Cigars, Cigarettes    Quit date: 02/04/2021    Years since quitting: 0.4   Smokeless tobacco: Never  Substance and Sexual Activity   Alcohol use: Not Currently   Drug use: No   Sexual activity: Not on file  Other Topics Concern   Not on file  Social History Narrative   Not on file   Social Determinants of Health   Financial Resource Strain: Not on file  Food Insecurity: Not on file  Transportation Needs: Not on file  Physical Activity: Not on file  Stress: Not on file  Social Connections: Not on file  Intimate Partner Violence: Not on file    FAMILY HISTORY: Family History  Problem Relation Age of Onset   Cancer Sister    Diabetes Mother    Cancer Maternal Grandmother    Cancer Paternal Grandmother     ALLERGIES:  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  MEDICATIONS:  Current Outpatient Medications  Medication Sig Dispense Refill   feeding supplement (ENSURE ENLIVE / ENSURE PLUS) LIQD Take 237 mLs by mouth daily. 237 mL 12   fentaNYL (DURAGESIC) 25 MCG/HR Place 1 patch onto the skin every 3 (three) days. 5 patch 0   lidocaine-prilocaine (EMLA) cream Apply to affected area once 30 g 3   loperamide (IMODIUM) 2 MG capsule Take 1 capsule (2 mg total) by mouth See admin instructions. Take 2 tablets with onset  of diarrhea, then 1 tab after every loose bowel movement. Maximum dose 8 tablets within 24 hours. 90 capsule 0   nicotine (NICODERM CQ - DOSED IN MG/24 HOURS) 21 mg/24hr patch Place 1 patch (21 mg total) onto the skin daily. 28 patch 0   ondansetron (ZOFRAN) 8 MG tablet Take 1 tablet (8 mg total) by mouth 2 (two) times daily as needed for refractory nausea / vomiting. Start on day 3 after chemotherapy. 30 tablet 1   prochlorperazine (COMPAZINE) 10 MG tablet Take 1 tablet (10 mg total) by mouth every 6 (six) hours as needed (Nausea or vomiting). 30 tablet 1   ibuprofen (ADVIL) 400 MG tablet Take 1 tablet (400 mg total) by mouth every 6 (six) hours  as needed. (Patient not taking: No sig reported) 30 tablet 0   senna-docusate (SENOKOT-S) 8.6-50 MG tablet Take 2 tablets by mouth daily. (Patient not taking: No sig reported) 120 tablet 0   No current facility-administered medications for this visit.   Facility-Administered Medications Ordered in Other Visits  Medication Dose Route Frequency Provider Last Rate Last Admin   fluorouracil (ADRUCIL) 4,350 mg in sodium chloride 0.9 % 63 mL chemo infusion  2,400 mg/m2 (Treatment Plan Recorded) Intravenous 1 day or 1 dose Earlie Server, MD       fluorouracil (ADRUCIL) chemo injection 750 mg  400 mg/m2 (Treatment Plan Recorded) Intravenous Once Earlie Server, MD         PHYSICAL EXAMINATION: ECOG PERFORMANCE STATUS: 1 - Symptomatic but completely ambulatory Vitals:   07/03/21 0843  BP: 123/84  Pulse: (!) 104  Temp: 97.9 F (36.6 C)     Filed Weights   07/03/21 0843  Weight: 156 lb 6.4 oz (70.9 kg)     Physical Exam Constitutional:      Comments: he ambulates independently.  HENT:     Head: Normocephalic and atraumatic.  Eyes:     General: No scleral icterus. Cardiovascular:     Rate and Rhythm: Normal rate and regular rhythm.     Heart sounds: Normal heart sounds.  Pulmonary:     Effort: Pulmonary effort is normal. No respiratory distress.     Breath sounds: No wheezing.     Comments: Decreased breath sound bilaterally.  Abdominal:     General: Bowel sounds are normal. There is no distension.     Palpations: Abdomen is soft.  Musculoskeletal:        General: No deformity. Normal range of motion.     Cervical back: Normal range of motion and neck supple.  Skin:    General: Skin is warm and dry.     Findings: No erythema or rash.  Neurological:     Mental Status: He is alert and oriented to person, place, and time. Mental status is at baseline.     Cranial Nerves: No cranial nerve deficit.     Coordination: Coordination normal.  Psychiatric:        Mood and Affect: Mood normal.     LABORATORY DATA:  I have reviewed the data as listed Lab Results  Component Value Date   WBC 5.2 07/03/2021   HGB 12.1 (L) 07/03/2021   HCT 35.4 (L) 07/03/2021   MCV 85.7 07/03/2021   PLT 169 07/03/2021   Recent Labs    06/11/21 0825 06/19/21 0829 07/03/21 0751  NA 134* 131* 134*  K 3.6 3.6 3.8  CL 99 98 100  CO2 26 26 26   GLUCOSE 145* 186* 172*  BUN 11 9  14  CREATININE 0.95 0.83 0.92  CALCIUM 9.2 8.5* 8.9  GFRNONAA >60 >60 >60  PROT 8.3* 7.4 7.8  ALBUMIN 3.7 3.1* 3.2*  AST 35 25 26  ALT 29 18 24   ALKPHOS 94 89 100  BILITOT 0.3 0.2* 0.3    Iron/TIBC/Ferritin/ %Sat    Component Value Date/Time   IRON 28 (L) 03/25/2021 1029   TIBC 356 03/25/2021 1029   FERRITIN 118 03/25/2021 1029   IRONPCTSAT 8 (L) 03/25/2021 1029      RADIOGRAPHIC STUDIES: I have personally reviewed the radiological images as listed and agreed with the findings in the report. No results found.      ASSESSMENT & PLAN:  1. Encounter for antineoplastic chemotherapy   2. Rectal cancer ([place])   3. Thrombocytopenia ([place])   4. Neoplasm related pain   5. Port-A-Cath in place   Cancer Staging Rectal cancer [facility]) Staging form: Colon and Rectum, AJCC 8th Edition - Clinical stage from 02/08/2021: Stage IIIB (cT4a, cN1, cM0) - Signed by Earlie Server, MD on 03/06/2021   #locally advanced rectal cancer. His case is complicated with a perirectal abscess. Prior to onset of perirectal abscess, on his 02/05/2021 scan, he was noted to have right internal iliac lymph node 3 x 1 x 2.3 cm which is concerning for nodal disease.On Subsequent images it is difficult to distinguish whether lymphadenopathy was due to nodal disease versus acute inflammation. 03/07/2021 MRI pelvis cT3 N2. Due to the possible contained perforation on previous CT, possible cT4 disease.  He is on TNT neoadjuvant protocol.  Labs are reviewed and discussed with patient Proceed with cycle 8 FOLFOX. Plan to start concurrent chemotherapy  Xeloda with radiation in 2 weeks.-   # Diarrhea, likely due to chemotherapy negative C diff.  Hold laxatives. Continue as needed Imodium as instructed. #Tachycardia, patient asymptomatic.  Encourage oral hydration.  # Thrombocytopenia, stable.  #Spiculated lung nodule, likely primary lung cancer. Discussed with IR, CT guided biopsy is difficult given the position and small size. Will monitor for now. His rectal cancer treatment takes priority. Plan to check CT chest after he finishes treatment to access stability  #Iron deficiency anemia Status post IV Venofer treatments, off oral iron supplementation due to constipation.  Hemoglobin 11.9.  Monitor.  #Rectal pain, dose reduce to 28mcg fentanyl patch patch.  Prescription sent to pharmacy.  He declined influenza vaccination  Follow up in 2 weeks for lab md  All questions were answered. The patient knows to call the clinic with any problems questions or concerns.    Earlie Server, MD, PhD 07/03/2021

## 2021-07-05 ENCOUNTER — Inpatient Hospital Stay: Payer: Self-pay

## 2021-07-05 ENCOUNTER — Other Ambulatory Visit: Payer: Self-pay

## 2021-07-05 DIAGNOSIS — C20 Malignant neoplasm of rectum: Secondary | ICD-10-CM

## 2021-07-05 MED ORDER — HEPARIN SOD (PORK) LOCK FLUSH 100 UNIT/ML IV SOLN
500.0000 [IU] | Freq: Once | INTRAVENOUS | Status: AC | PRN
  Administered 2021-07-05: 500 [IU]
  Filled 2021-07-05: qty 5

## 2021-07-05 MED ORDER — SODIUM CHLORIDE 0.9% FLUSH
10.0000 mL | INTRAVENOUS | Status: DC | PRN
  Administered 2021-07-05: 10 mL
  Filled 2021-07-05: qty 10

## 2021-07-08 ENCOUNTER — Other Ambulatory Visit: Payer: Self-pay

## 2021-07-08 ENCOUNTER — Ambulatory Visit
Admission: RE | Admit: 2021-07-08 | Discharge: 2021-07-08 | Disposition: A | Discharge status: Home / Self Care | Payer: Self-pay | Source: Ambulatory Visit | Attending: Radiation Oncology | Admitting: Radiation Oncology

## 2021-07-08 ENCOUNTER — Encounter: Payer: Self-pay | Admitting: Oncology

## 2021-07-08 ENCOUNTER — Other Ambulatory Visit: Payer: Self-pay | Admitting: Oncology

## 2021-07-08 ENCOUNTER — Other Ambulatory Visit (HOSPITAL_COMMUNITY): Payer: Self-pay

## 2021-07-08 DIAGNOSIS — C20 Malignant neoplasm of rectum: Secondary | ICD-10-CM | POA: Insufficient documentation

## 2021-07-08 DIAGNOSIS — Z5111 Encounter for antineoplastic chemotherapy: Secondary | ICD-10-CM | POA: Insufficient documentation

## 2021-07-08 MED ORDER — ONDANSETRON HCL 8 MG PO TABS: 8.0000 mg | ORAL_TABLET | Freq: Two times a day (BID) | ORAL | 1 refills | Status: DC | PRN

## 2021-07-08 MED ORDER — LOPERAMIDE HCL 2 MG PO CAPS: 2.0000 mg | ORAL_CAPSULE | ORAL | 0 refills | Status: DC

## 2021-07-08 MED ORDER — PROCHLORPERAZINE MALEATE 10 MG PO TABS: 10.0000 mg | ORAL_TABLET | Freq: Four times a day (QID) | ORAL | 1 refills | Status: DC | PRN

## 2021-07-09 ENCOUNTER — Encounter: Payer: Self-pay | Admitting: Oncology

## 2021-07-09 ENCOUNTER — Other Ambulatory Visit (HOSPITAL_COMMUNITY): Payer: Self-pay

## 2021-07-10 NOTE — Telephone Encounter (Signed)
Oral Oncology Patient Advocate Encounter  I spoke with [name] this morning to set up delivery of Xeloda.  Address verified for shipment.  Xeloda will be filled through [facility] and mailed 11/1 for delivery 11/2.  Patient was instructed to not start medication when received and to bring to his appt before he starts radiation for education.    [facility] will call 7-10 days before next refill is due to complete adherence call and set up delivery of medication.     [place] Patient [place] Phone [phone] Fax [phone] 07/10/2021 8:25 AM

## 2021-07-11 ENCOUNTER — Telehealth: Payer: Self-pay

## 2021-07-11 ENCOUNTER — Encounter: Payer: Self-pay | Admitting: Oncology

## 2021-07-11 NOTE — Telephone Encounter (Signed)
Patient called regarding radiation cream. Informed patient samples will be available upon request at next appointment.

## 2021-07-12 ENCOUNTER — Other Ambulatory Visit: Payer: Self-pay

## 2021-07-12 DIAGNOSIS — Z5111 Encounter for antineoplastic chemotherapy: Secondary | ICD-10-CM | POA: Insufficient documentation

## 2021-07-12 DIAGNOSIS — C20 Malignant neoplasm of rectum: Secondary | ICD-10-CM | POA: Insufficient documentation

## 2021-07-12 MED ORDER — FENTANYL 25 MCG/HR TD PT72: 1.0000 | MEDICATED_PATCH | TRANSDERMAL | 0 refills | Status: DC

## 2021-07-12 NOTE — Progress Notes (Signed)
Rx has "approved for [facility] patient assistance" written on rx.

## 2021-07-15 ENCOUNTER — Other Ambulatory Visit: Payer: Self-pay

## 2021-07-15 NOTE — Progress Notes (Signed)
[place]  [phone] [phone]  Patient Care Team: Pcp, No as PCP - General Barry Jacks, RN as Oncology Nurse Navigator   Name of the patient: Barry [name]  [id number]  [birth year]   Date of visit: 07/16/21  HPI: Patient is a 55 y.o. male with locally advance rectal cancer. Planned treatment with radiation and Xeloda (capecitabine). Treatment will start tomorrow 07/17/21.  Reason for Consult: Capecitabine oral chemotherapy education.   PAST MEDICAL HISTORY: Past Medical History:  Diagnosis Date   Headache    Left shoulder pain    Rectal cancer [facility])     HEMATOLOGY/ONCOLOGY HISTORY:  Oncology History  Rectal cancer ([place])  02/08/2021 Initial Diagnosis   Rectal cancer ([place])   02/08/2021 Cancer Staging   Staging form: Colon and Rectum, AJCC 8th Edition - Clinical stage from 02/08/2021: Stage IIIB (cT4a, cN1, cM0) - Signed by Barry Server, MD on 03/06/2021 Stage prefix: Initial diagnosis    03/18/2021 -  Chemotherapy   Patient is on Treatment Plan : COLORECTAL FOLFOX q14d x 4 months       ALLERGIES:  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  MEDICATIONS:  Current Outpatient Medications  Medication Sig Dispense Refill   [START ON 07/17/2021] capecitabine (XELODA) 500 MG tablet Take 3 tablets (1,500 mg total) by mouth 2 (two) times daily after a meal. Take Monday-Friday. Take only on days of radiation. 168 tablet 0   feeding supplement (ENSURE ENLIVE / ENSURE PLUS) LIQD Take 237 mLs by mouth daily. 237 mL 12   fentaNYL (DURAGESIC) 25 MCG/HR Place 1 patch onto the skin every 3 (three) days. 5 patch 0   ibuprofen (ADVIL) 400 MG tablet Take 1 tablet (400 mg total) by mouth every 6 (six) hours as needed. (Patient not taking: No sig reported) 30 tablet 0   lidocaine-prilocaine (EMLA) cream Apply to affected area once 30 g 3   loperamide (IMODIUM) 2 MG capsule Take 1 capsule (2 mg total) by mouth See admin  instructions. Take 2 tablets with onset of diarrhea, then 1 tab after every loose bowel movement. Maximum dose 8 tablets within 24 hours. 90 capsule 0   nicotine (NICODERM CQ - DOSED IN MG/24 HOURS) 21 mg/24hr patch Place 1 patch (21 mg total) onto the skin daily. 28 patch 0   ondansetron (ZOFRAN) 8 MG tablet Take 1 tablet (8 mg total) by mouth 2 (two) times daily as needed for refractory nausea / vomiting. Start on day 3 after chemotherapy. 30 tablet 1   prochlorperazine (COMPAZINE) 10 MG tablet Take 1 tablet (10 mg total) by mouth every 6 (six) hours as needed (Nausea or vomiting). 30 tablet 1   senna-docusate (SENOKOT-S) 8.6-50 MG tablet Take 2 tablets by mouth daily. (Patient not taking: No sig reported) 120 tablet 0   No current facility-administered medications for this visit.    VITAL SIGNS: There were no vitals taken for this visit. There were no vitals filed for this visit.  Estimated body mass index is 23.78 kg/m as calculated from the following:   Height as of 03/27/21: 5\' 8"  (1.727 m).   Weight as of 07/03/21: 70.9 kg (156 lb 6.4 oz).  LABS: CBC:    Component Value Date/Time   WBC 5.2 07/03/2021 0751   HGB 12.1 (L) 07/03/2021 0751   HCT 35.4 (L) 07/03/2021 0751   PLT 169 07/03/2021 0751   MCV 85.7 07/03/2021 0751   NEUTROABS 1.7 07/03/2021 0751   LYMPHSABS 1.9 07/03/2021  0751   MONOABS 1.0 07/03/2021 0751   EOSABS 0.5 07/03/2021 0751   BASOSABS 0.0 07/03/2021 0751   Comprehensive Metabolic Panel:    Component Value Date/Time   NA 134 (L) 07/03/2021 0751   K 3.8 07/03/2021 0751   CL 100 07/03/2021 0751   CO2 26 07/03/2021 0751   BUN 14 07/03/2021 0751   CREATININE 0.92 07/03/2021 0751   GLUCOSE 172 (H) 07/03/2021 0751   CALCIUM 8.9 07/03/2021 0751   AST 26 07/03/2021 0751   ALT 24 07/03/2021 0751   ALKPHOS 100 07/03/2021 0751   BILITOT 0.3 07/03/2021 0751   PROT 7.8 07/03/2021 0751   ALBUMIN 3.2 (L) 07/03/2021 0751     Present during today's visit:  patient only  Start plan: [name] will get started on his capecitabine tomorrow 07/17/21 along with his first day of radiation   Patient Education I spoke with patient for overview of new oral chemotherapy medication: capecitabine   Administration: Counseled patient on administration, dosing, side effects, monitoring, drug-food interactions, safe handling, storage, and disposal. Patient will take 3 tablets (1,500 mg total) by mouth 2 (two) times daily after a meal. Take Monday-Friday. Take only on days of radiation.  Side Effects: Side effects include but not limited to: diarrhea, hand-foot syndrome, mouth sores, edema, decreased wbc, fatigue, N/V.   Diarrhea: Mr. Don has loperamide on hand to use as needed. He last needed to use the loperamide last week. He has issues with diarrhea during his IV treatment. He knows to continue to use the loperamide as needed and to call if he is having 4 or more loose stools per day Hand-foot syndrome: provided patient with [place] Smooth Extra Care 20 to use on his hand and feet. He knows to report any skin peeling, cracking, or blistering Mouth sores: patient will call me if mouth sores occur to obtain magic mouth wash  Drug-drug Interactions (DDI): No DDIs with current medication list  Adherence: After discussion with patient no patient barriers to medication adherence identified. Patient provided with AM/PM pill box. Reviewed with patient importance of keeping a medication schedule and plan for any missed doses.  [name] voiced understanding and appreciation. All questions answered. Medication handout provided.  Provided patient with [facility] phone number. Patient knows to call the office with questions or concerns. [facility] will continue to follow.  Patient expressed understanding and was in agreement with this plan. He also understands that He can call clinic at any time with any  questions, concerns, or complaints.   Medication Access Issues: No issues, patient has enough capecitabine on hand to complete his radiation treatment  Thank you for allowing me to participate in the care of this patient.   Time Total: 25 mins  Visit consisted of counseling and education on dealing with issues of symptom management in the setting of serious and potentially life-threatening illness.Greater than 50%  of this time was spent counseling and coordinating care related to the above assessment and plan.  Signed by: Darl Pikes, PharmD, BCPS, Salley Slaughter, CPP Hematology/Oncology Clinical Pharmacist Practitioner [facility] [phone]  07/16/2021 9:04 AM

## 2021-07-16 ENCOUNTER — Ambulatory Visit: Admission: RE | Admit: 2021-07-16 | Payer: Self-pay | Source: Ambulatory Visit

## 2021-07-16 ENCOUNTER — Inpatient Hospital Stay: Payer: Self-pay | Attending: Oncology | Admitting: Pharmacist

## 2021-07-16 ENCOUNTER — Other Ambulatory Visit: Payer: Self-pay

## 2021-07-16 DIAGNOSIS — Z79891 Long term (current) use of opiate analgesic: Secondary | ICD-10-CM | POA: Insufficient documentation

## 2021-07-16 DIAGNOSIS — C20 Malignant neoplasm of rectum: Secondary | ICD-10-CM | POA: Insufficient documentation

## 2021-07-16 DIAGNOSIS — D509 Iron deficiency anemia, unspecified: Secondary | ICD-10-CM | POA: Insufficient documentation

## 2021-07-16 DIAGNOSIS — F17210 Nicotine dependence, cigarettes, uncomplicated: Secondary | ICD-10-CM | POA: Insufficient documentation

## 2021-07-16 DIAGNOSIS — Z79899 Other long term (current) drug therapy: Secondary | ICD-10-CM | POA: Insufficient documentation

## 2021-07-16 DIAGNOSIS — R911 Solitary pulmonary nodule: Secondary | ICD-10-CM | POA: Insufficient documentation

## 2021-07-16 DIAGNOSIS — R197 Diarrhea, unspecified: Secondary | ICD-10-CM | POA: Insufficient documentation

## 2021-07-16 DIAGNOSIS — J439 Emphysema, unspecified: Secondary | ICD-10-CM | POA: Insufficient documentation

## 2021-07-16 DIAGNOSIS — D696 Thrombocytopenia, unspecified: Secondary | ICD-10-CM | POA: Insufficient documentation

## 2021-07-16 DIAGNOSIS — R000 Tachycardia, unspecified: Secondary | ICD-10-CM | POA: Insufficient documentation

## 2021-07-16 DIAGNOSIS — D701 Agranulocytosis secondary to cancer chemotherapy: Secondary | ICD-10-CM | POA: Insufficient documentation

## 2021-07-16 DIAGNOSIS — G893 Neoplasm related pain (acute) (chronic): Secondary | ICD-10-CM | POA: Insufficient documentation

## 2021-07-16 DIAGNOSIS — K612 Anorectal abscess: Secondary | ICD-10-CM | POA: Insufficient documentation

## 2021-07-17 ENCOUNTER — Encounter: Payer: Self-pay | Admitting: Oncology

## 2021-07-17 ENCOUNTER — Ambulatory Visit
Admission: RE | Admit: 2021-07-17 | Discharge: 2021-07-17 | Disposition: A | Discharge status: Home / Self Care | Payer: Self-pay | Source: Ambulatory Visit | Attending: Radiation Oncology | Admitting: Radiation Oncology

## 2021-07-17 ENCOUNTER — Other Ambulatory Visit (HOSPITAL_COMMUNITY): Payer: Self-pay

## 2021-07-17 ENCOUNTER — Other Ambulatory Visit: Payer: Self-pay

## 2021-07-17 ENCOUNTER — Inpatient Hospital Stay (HOSPITAL_BASED_OUTPATIENT_CLINIC_OR_DEPARTMENT_OTHER): Payer: Self-pay | Admitting: Oncology

## 2021-07-17 ENCOUNTER — Inpatient Hospital Stay: Payer: Self-pay

## 2021-07-17 VITALS — BP 123/91 | HR 101 | Temp 97.5°F | Resp 18 | Wt 150.6 lb

## 2021-07-17 DIAGNOSIS — R911 Solitary pulmonary nodule: Secondary | ICD-10-CM

## 2021-07-17 DIAGNOSIS — K521 Toxic gastroenteritis and colitis: Secondary | ICD-10-CM

## 2021-07-17 DIAGNOSIS — G893 Neoplasm related pain (acute) (chronic): Secondary | ICD-10-CM

## 2021-07-17 DIAGNOSIS — C20 Malignant neoplasm of rectum: Secondary | ICD-10-CM

## 2021-07-17 DIAGNOSIS — D696 Thrombocytopenia, unspecified: Secondary | ICD-10-CM

## 2021-07-17 DIAGNOSIS — Z5111 Encounter for antineoplastic chemotherapy: Secondary | ICD-10-CM

## 2021-07-17 DIAGNOSIS — T451X5A Adverse effect of antineoplastic and immunosuppressive drugs, initial encounter: Secondary | ICD-10-CM

## 2021-07-17 DIAGNOSIS — Z95828 Presence of other vascular implants and grafts: Secondary | ICD-10-CM

## 2021-07-17 LAB — CBC WITH DIFFERENTIAL/PLATELET
Abs Immature Granulocytes: 0.01 10*3/uL (ref 0.00–0.07)
Basophils Absolute: 0 10*3/uL (ref 0.0–0.1)
Basophils Relative: 1 %
Eosinophils Absolute: 0.2 10*3/uL (ref 0.0–0.5)
Eosinophils Relative: 5 %
HCT: 35.1 % — ABNORMAL LOW (ref 39.0–52.0)
Hemoglobin: 12 g/dL — ABNORMAL LOW (ref 13.0–17.0)
Immature Granulocytes: 0 %
Lymphocytes Relative: 34 %
Lymphs Abs: 1.5 10*3/uL (ref 0.7–4.0)
MCH: 30 pg (ref 26.0–34.0)
MCHC: 34.2 g/dL (ref 30.0–36.0)
MCV: 87.8 fL (ref 80.0–100.0)
Monocytes Absolute: 0.8 10*3/uL (ref 0.1–1.0)
Monocytes Relative: 18 %
Neutro Abs: 1.8 10*3/uL (ref 1.7–7.7)
Neutrophils Relative %: 42 %
Platelets: 180 10*3/uL (ref 150–400)
RBC: 4 MIL/uL — ABNORMAL LOW (ref 4.22–5.81)
RDW: 19.7 % — ABNORMAL HIGH (ref 11.5–15.5)
WBC: 4.4 10*3/uL (ref 4.0–10.5)
nRBC: 0 % (ref 0.0–0.2)

## 2021-07-17 LAB — COMPREHENSIVE METABOLIC PANEL
ALT: 23 U/L (ref 0–44)
AST: 33 U/L (ref 15–41)
Albumin: 3.3 g/dL — ABNORMAL LOW (ref 3.5–5.0)
Alkaline Phosphatase: 94 U/L (ref 38–126)
Anion gap: 10 (ref 5–15)
BUN: 7 mg/dL (ref 6–20)
CO2: 25 mmol/L (ref 22–32)
Calcium: 8.8 mg/dL — ABNORMAL LOW (ref 8.9–10.3)
Chloride: 100 mmol/L (ref 98–111)
Creatinine, Ser: 0.93 mg/dL (ref 0.61–1.24)
GFR, Estimated: >60 mL/min (ref >60)
Glucose, Bld: 195 mg/dL — ABNORMAL HIGH (ref 70–99)
Potassium: 3.7 mmol/L (ref 3.5–5.1)
Sodium: 135 mmol/L (ref 135–145)
Total Bilirubin: 0.2 mg/dL — ABNORMAL LOW (ref 0.3–1.2)
Total Protein: 7.5 g/dL (ref 6.5–8.1)

## 2021-07-17 MED ORDER — FENTANYL 25 MCG/HR TD PT72
1.0000 | MEDICATED_PATCH | TRANSDERMAL | 0 refills | Status: DC
  Filled 2021-07-17: qty 10, 30d supply, fill #0

## 2021-07-17 NOTE — Progress Notes (Signed)
Pt here for follow up.

## 2021-07-17 NOTE — Progress Notes (Signed)
Hematology/Oncology follow up note [phone] [phone]   Patient Care Team: Pcp, No as PCP - General [name], RN as Oncology Nurse Navigator  CHIEF COMPLAINTS/REASON FOR VISIT:  Follow up for rectal cancer treatment  HISTORY OF PRESENTING ILLNESS:   Barry Vanhook. is a  55 y.o.  male with PMH listed below was seen in consultation at the request of  No ref. provider found  for evaluation of rectal cancer  02/05/2021-02/06/2021 patient was hospitalized due to generalized weakness, intermittent lightheadedness, weight loss and worsening constipation.  02/05/2021 CT abdomen showed concerning of severe rectal wall thickening and right internal iliac lymph node concerning for metastatic disease.  Patient was seen by gastroenterology and had colonoscopy which showed a circumferential fungating mass in the rectum.  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  02/14/2021-02/16/2021 hospitalized due to rectal bleeding and pain.   02/14/2021 CT showed perirectal fluid collection/gas concerning for infection with significant leukocytosis, anemia with hemoglobin of 6.8.  Patient received PRBC transfusion, IV antibiotics.  He underwent an IR guided placement of JP drain into the rectal abscess.  Discharged home with oral Augmentin. 02/20/2021 presented to ER with new skin opening and draining from left gluteus.  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  JP drain has been removed.  Patient was continued on Augmentin.  02/13/2021, PET scan showed locally advanced rectal cancer with billowing of the mesorectum now with low attenuation material that was not present on previous examination with extensive stranding and inflammation.   Bulky RIGHT pelvic sidewall/hypogastric lymph node outside of the mesorectum with stippled calcification measuring 19 mm-no increased metabolic activity. Small LEFT hypogastric lymph node just peripheral to the internal  external bifurcation-SUV 3.2 High RIGHT internal just below or at the internal/common iliac transition, lymph node -SUV 4.8 LEFT high hypogastric lymph node  8 mm-SUV 3. Scattered lymph nodes throughout the retroperitoneum with low FDG uptake Bilateral inguinal lymph nodes largest on the RIGHT (image 248/3) 11 mm with a maximum SUV of 2.8  spiculated nodule in the LEFT upper lobe- 9 x 8 mm  02/14/2021, CT abdomen pelvis without contrast Showed perforated rectal mass with contained perforation with fluid and gas extending above and below the pelvic floor,potentially involving the sphincter complex and extending into LEFT ischial rectal fossa  02/20/2021, CT pelvis with contrast showed Large perirectal/perianal abscess has markedly decreased in size since placement of the percutaneous drain. There are residual gas-filled collections in the soft tissues and suspect there is a fistula or sinus tract between the rectal mass and the subcutaneous tissues. Soft tissue gas along the medial left buttock and concern for a cutaneous ulceration in this area. Large rectal mass with evidence for a large necrotic right pelvic lymph node.   02/27/2021 medi port placed by Dr.Dew 03/13/2021 reports right butt cheek and also left perianal area fullness.he was seen by Dr.Pabon urgently  and had right buttock abscess drained. Left perianal fullness was felt to be due to cancer.   03/13/2021 started [place]  # He was seen by [name] Dr.Mantyh who agrees with TNT followed by surgery. # Emphysema, recommend him to establish care with PCP. He was provided with a list of PCP office contact information.   INTERVAL HISTORY Barry Diguglielmo. is a 55 y.o. male who has above history reviewed by me today presents for follow up visit for management of rectal cancer. Patient starting radiation today.  He has had Xeloda    Review of  Systems  Constitutional:  Negative for appetite change, chills, fatigue, fever and unexpected weight  change.  HENT:   Negative for hearing loss and voice change.   Eyes:  Negative for eye problems and icterus.  Respiratory:  Negative for chest tightness, cough and shortness of breath.   Cardiovascular:  Negative for chest pain and leg swelling.  Gastrointestinal:  Positive for rectal pain. Negative for abdominal distention, abdominal pain, blood in stool, constipation and diarrhea.  Endocrine: Negative for hot flashes.  Genitourinary:  Negative for difficulty urinating, dysuria and frequency.   Musculoskeletal:  Negative for arthralgias.  Skin:  Negative for itching and rash.  Neurological:  Negative for light-headedness and numbness.  Hematological:  Negative for adenopathy. Does not bruise/bleed easily.  Psychiatric/Behavioral:  Negative for confusion.    MEDICAL HISTORY:  Past Medical History:  Diagnosis Date   Headache    Left shoulder pain    Rectal cancer ([place])     SURGICAL HISTORY: Past Surgical History:  Procedure Laterality Date   COLONOSCOPY WITH PROPOFOL N/A 02/06/2021   Procedure: COLONOSCOPY WITH PROPOFOL;  Surgeon: Lin Landsman, MD;  [facility] ENDOSCOPY;  Service: Gastroenterology;  Laterality: N/A;   PORTA CATH INSERTION N/A 02/27/2021   Procedure: PORTA CATH INSERTION;  Surgeon: Algernon Huxley, MD;  Location: [facility] LAB;  Service: Cardiovascular;  Laterality: N/A;   ROTATOR CUFF REPAIR Left     SOCIAL HISTORY: Social History   Socioeconomic History   Marital status: Married    Spouse name: Not on file   Number of children: Not on file   Years of education: Not on file   Highest education level: Not on file  Occupational History   Not on file  Tobacco Use   Smoking status: Former    Packs/day: 1.00    Years: 10.00    Pack years: 10.00    Types: Cigars, Cigarettes    Quit date: 02/04/2021    Years since quitting: 0.4   Smokeless tobacco: Never  Substance and Sexual Activity   Alcohol use: Not Currently   Drug use: No   Sexual  activity: Not on file  Other Topics Concern   Not on file  Social History Narrative   Not on file   Social Determinants of Health   Financial Resource Strain: Not on file  Food Insecurity: Not on file  Transportation Needs: Not on file  Physical Activity: Not on file  Stress: Not on file  Social Connections: Not on file  Intimate Partner Violence: Not on file    FAMILY HISTORY: Family History  Problem Relation Age of Onset   Cancer Sister    Diabetes Mother    Cancer Maternal Grandmother    Cancer Paternal Grandmother     ALLERGIES:  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  MEDICATIONS:  Current Outpatient Medications  Medication Sig Dispense Refill   capecitabine (XELODA) 500 MG tablet Take 3 tablets (1,500 mg total) by mouth 2 (two) times daily after a meal. Take Monday-Friday. Take only on days of radiation. 168 tablet 0   feeding supplement (ENSURE ENLIVE / ENSURE PLUS) LIQD Take 237 mLs by mouth daily. 237 mL 12   lidocaine-prilocaine (EMLA) cream Apply to affected area once 30 g 3   loperamide (IMODIUM) 2 MG capsule Take 1 capsule (2 mg total) by mouth See admin instructions. Take 2 tablets with onset of diarrhea, then 1 tab after every loose bowel movement. Maximum dose 8 tablets within 24 hours. 90 capsule  0   nicotine (NICODERM CQ - DOSED IN MG/24 HOURS) 21 mg/24hr patch Place 1 patch (21 mg total) onto the skin daily. 28 patch 0   ondansetron (ZOFRAN) 8 MG tablet Take 1 tablet (8 mg total) by mouth 2 (two) times daily as needed for refractory nausea / vomiting. Start on day 3 after chemotherapy. 30 tablet 1   prochlorperazine (COMPAZINE) 10 MG tablet Take 1 tablet (10 mg total) by mouth every 6 (six) hours as needed (Nausea or vomiting). 30 tablet 1   fentaNYL (DURAGESIC) 25 MCG/HR Place 1 patch onto the skin every 3 (three) days. 10 patch 0   senna-docusate (SENOKOT-S) 8.6-50 MG tablet Take 2 tablets by mouth daily. (Patient not taking: No sig reported) 120 tablet 0   No  current facility-administered medications for this visit.     PHYSICAL EXAMINATION: ECOG PERFORMANCE STATUS: 1 - Symptomatic but completely ambulatory Vitals:   07/17/21 0906  BP: (!) 123/91  Pulse: (!) 101  Resp: 18  Temp: (!) 97.5 F (36.4 C)     Filed Weights   07/17/21 0906  Weight: 150 lb 9.6 oz (68.3 kg)     Physical Exam Constitutional:      Comments: he ambulates independently.  HENT:     Head: Normocephalic and atraumatic.  Eyes:     General: No scleral icterus. Cardiovascular:     Rate and Rhythm: Normal rate and regular rhythm.     Heart sounds: Normal heart sounds.  Pulmonary:     Effort: Pulmonary effort is normal. No respiratory distress.     Breath sounds: No wheezing.     Comments: Decreased breath sound bilaterally.  Abdominal:     General: Bowel sounds are normal. There is no distension.     Palpations: Abdomen is soft.  Musculoskeletal:        General: No deformity. Normal range of motion.     Cervical back: Normal range of motion and neck supple.  Skin:    General: Skin is warm and dry.     Findings: No erythema or rash.  Neurological:     Mental Status: He is alert and oriented to person, place, and time. Mental status is at baseline.     Cranial Nerves: No cranial nerve deficit.     Coordination: Coordination normal.  Psychiatric:        Mood and Affect: Mood normal.    LABORATORY DATA:  I have reviewed the data as listed Lab Results  Component Value Date   WBC 4.4 07/17/2021   HGB 12.0 (L) 07/17/2021   HCT 35.1 (L) 07/17/2021   MCV 87.8 07/17/2021   PLT 180 07/17/2021   Recent Labs    06/19/21 0829 07/03/21 0751 07/17/21 0824  NA 131* 134* 135  K 3.6 3.8 3.7  CL 98 100 100  CO2 26 26 25   GLUCOSE 186* 172* 195*  BUN 9 14 7   CREATININE 0.83 0.92 0.93  CALCIUM 8.5* 8.9 8.8*  GFRNONAA >60 >60 >60  PROT 7.4 7.8 7.5  ALBUMIN 3.1* 3.2* 3.3*  AST 25 26 33  ALT 18 24 23   ALKPHOS 89 100 94  BILITOT 0.2* 0.3 0.2*     Iron/TIBC/Ferritin/ %Sat    Component Value Date/Time   IRON 28 (L) 03/25/2021 1029   TIBC 356 03/25/2021 1029   FERRITIN 118 03/25/2021 1029   IRONPCTSAT 8 (L) 03/25/2021 1029      RADIOGRAPHIC STUDIES: I have personally reviewed the radiological images as listed and  agreed with the findings in the report. No results found.      ASSESSMENT & PLAN:  1. Encounter for antineoplastic chemotherapy   2. Rectal cancer ([place])   3. Thrombocytopenia ([place])   4. Neoplasm related pain   5. Chemotherapy induced diarrhea   6. Lung nodule   7. Port-A-Cath in place   Cancer Staging Rectal cancer [facility]) Staging form: Colon and Rectum, AJCC 8th Edition - Clinical stage from 02/08/2021: Stage IIIB (cT4a, cN1, cM0) - Signed by Earlie Server, MD on 03/06/2021   #locally advanced rectal cancer. His case is complicated with a perirectal abscess. Prior to onset of perirectal abscess, on his 02/05/2021 scan, he was noted to have right internal iliac lymph node 3 x 1 x 2.3 cm which is concerning for nodal disease.On Subsequent images it is difficult to distinguish whether lymphadenopathy was due to nodal disease versus acute inflammation. 03/07/2021 MRI pelvis cT3 N2. Due to the possible contained perforation on previous CT, possible cT4 disease.  He is on TNT neoadjuvant protocol.  Labs reviewed and discussed with patient. Rationale and potential side effects of Xeloda have been discussed with patient. Proceed with Xeloda 1500 mg twice daily on radiation days.   # Diarrhea, likely due to chemotherapy negative C diff. Continue as needed Imodium as instructed. #Tachycardia, improved encourage oral hydration.  #Spiculated lung nodule, likely primary lung cancer. Discussed with IR, CT guided biopsy is difficult given the position and small size. Will monitor for now. His rectal cancer treatment takes priority. Plan to check CT chest before surgery to access stability  #Iron deficiency anemia Status  post IV Venofer treatments, off oral iron supplementation due to constipation.  Hemoglobin 12 monitor.  #Rectal pain, dose reduce to 36mcg fentanyl patch patch.  Prescription sent to pharmacy.  He declined influenza vaccination  Follow up in 1 week for lab md  All questions were answered. The patient knows to call the clinic with any problems questions or concerns.    Earlie Server, MD, PhD 07/17/2021

## 2021-07-18 ENCOUNTER — Other Ambulatory Visit: Payer: Self-pay

## 2021-07-18 ENCOUNTER — Telehealth: Payer: Self-pay

## 2021-07-18 ENCOUNTER — Ambulatory Visit
Admission: RE | Admit: 2021-07-18 | Discharge: 2021-07-18 | Disposition: A | Discharge status: Home / Self Care | Payer: Self-pay | Source: Ambulatory Visit | Attending: Radiation Oncology | Admitting: Radiation Oncology

## 2021-07-18 ENCOUNTER — Encounter: Payer: Self-pay | Admitting: Oncology

## 2021-07-18 NOTE — Telephone Encounter (Signed)
Message sent to Dr. Hester Mates regarding MRI and follow up that is scheduled for the end of November. Chemorads will not be complete until towards the end of December.

## 2021-07-19 ENCOUNTER — Ambulatory Visit
Admission: RE | Admit: 2021-07-19 | Discharge: 2021-07-19 | Disposition: A | Discharge status: Home / Self Care | Payer: Self-pay | Source: Ambulatory Visit | Attending: Radiation Oncology | Admitting: Radiation Oncology

## 2021-07-19 NOTE — Telephone Encounter (Signed)
Called patient to get more information.No answer, unable to leave message. Pt has appt next Wednesday as well.

## 2021-07-22 ENCOUNTER — Ambulatory Visit
Admission: RE | Admit: 2021-07-22 | Discharge: 2021-07-22 | Disposition: A | Discharge status: Home / Self Care | Payer: Self-pay | Source: Ambulatory Visit | Attending: Radiation Oncology | Admitting: Radiation Oncology

## 2021-07-23 ENCOUNTER — Ambulatory Visit
Admission: RE | Admit: 2021-07-23 | Discharge: 2021-07-23 | Disposition: A | Discharge status: Home / Self Care | Payer: Self-pay | Source: Ambulatory Visit | Attending: Radiation Oncology | Admitting: Radiation Oncology

## 2021-07-24 ENCOUNTER — Inpatient Hospital Stay (HOSPITAL_BASED_OUTPATIENT_CLINIC_OR_DEPARTMENT_OTHER): Payer: Self-pay | Admitting: Oncology

## 2021-07-24 ENCOUNTER — Encounter: Payer: Self-pay | Admitting: Oncology

## 2021-07-24 ENCOUNTER — Inpatient Hospital Stay: Payer: Self-pay

## 2021-07-24 ENCOUNTER — Other Ambulatory Visit: Payer: Self-pay

## 2021-07-24 ENCOUNTER — Ambulatory Visit
Admission: RE | Admit: 2021-07-24 | Discharge: 2021-07-24 | Disposition: A | Discharge status: Home / Self Care | Payer: Self-pay | Source: Ambulatory Visit | Attending: Radiation Oncology | Admitting: Radiation Oncology

## 2021-07-24 ENCOUNTER — Other Ambulatory Visit (HOSPITAL_COMMUNITY): Payer: Self-pay

## 2021-07-24 VITALS — BP 140/84 | HR 96 | Temp 98.2°F | Wt 154.0 lb

## 2021-07-24 DIAGNOSIS — D701 Agranulocytosis secondary to cancer chemotherapy: Secondary | ICD-10-CM

## 2021-07-24 DIAGNOSIS — C20 Malignant neoplasm of rectum: Secondary | ICD-10-CM

## 2021-07-24 DIAGNOSIS — G893 Neoplasm related pain (acute) (chronic): Secondary | ICD-10-CM

## 2021-07-24 DIAGNOSIS — T451X5A Adverse effect of antineoplastic and immunosuppressive drugs, initial encounter: Secondary | ICD-10-CM

## 2021-07-24 DIAGNOSIS — Z5111 Encounter for antineoplastic chemotherapy: Secondary | ICD-10-CM

## 2021-07-24 LAB — CBC WITH DIFFERENTIAL/PLATELET
Abs Immature Granulocytes: 0.01 10*3/uL (ref 0.00–0.07)
Basophils Absolute: 0 10*3/uL (ref 0.0–0.1)
Basophils Relative: 1 %
Eosinophils Absolute: 0.1 10*3/uL (ref 0.0–0.5)
Eosinophils Relative: 5 %
HCT: 35.3 % — ABNORMAL LOW (ref 39.0–52.0)
Hemoglobin: 11.8 g/dL — ABNORMAL LOW (ref 13.0–17.0)
Immature Granulocytes: 0 %
Lymphocytes Relative: 36 %
Lymphs Abs: 1 10*3/uL (ref 0.7–4.0)
MCH: 29.9 pg (ref 26.0–34.0)
MCHC: 33.4 g/dL (ref 30.0–36.0)
MCV: 89.6 fL (ref 80.0–100.0)
Monocytes Absolute: 0.5 10*3/uL (ref 0.1–1.0)
Monocytes Relative: 19 %
Neutro Abs: 1.1 10*3/uL — ABNORMAL LOW (ref 1.7–7.7)
Neutrophils Relative %: 39 %
Platelets: 219 10*3/uL (ref 150–400)
RBC: 3.94 MIL/uL — ABNORMAL LOW (ref 4.22–5.81)
RDW: 19.4 % — ABNORMAL HIGH (ref 11.5–15.5)
WBC: 2.8 10*3/uL — ABNORMAL LOW (ref 4.0–10.5)
nRBC: 0 % (ref 0.0–0.2)

## 2021-07-24 LAB — COMPREHENSIVE METABOLIC PANEL
ALT: 26 U/L (ref 0–44)
AST: 34 U/L (ref 15–41)
Albumin: 3.3 g/dL — ABNORMAL LOW (ref 3.5–5.0)
Alkaline Phosphatase: 91 U/L (ref 38–126)
Anion gap: 8 (ref 5–15)
BUN: 13 mg/dL (ref 6–20)
CO2: 24 mmol/L (ref 22–32)
Calcium: 8.6 mg/dL — ABNORMAL LOW (ref 8.9–10.3)
Chloride: 101 mmol/L (ref 98–111)
Creatinine, Ser: 0.87 mg/dL (ref 0.61–1.24)
GFR, Estimated: >60 mL/min (ref >60)
Glucose, Bld: 144 mg/dL — ABNORMAL HIGH (ref 70–99)
Potassium: 4 mmol/L (ref 3.5–5.1)
Sodium: 133 mmol/L — ABNORMAL LOW (ref 135–145)
Total Bilirubin: 0.4 mg/dL (ref 0.3–1.2)
Total Protein: 7.9 g/dL (ref 6.5–8.1)

## 2021-07-24 NOTE — Progress Notes (Signed)
Hematology/Oncology follow up note [phone] [phone]   Patient Care Team: Pcp, No as PCP - General [name], RN as Oncology Nurse Navigator  CHIEF COMPLAINTS/REASON FOR VISIT:  Follow up for rectal cancer treatment  HISTORY OF PRESENTING ILLNESS:   Barry Grenz. is a  55 y.o.  male with PMH listed below was seen in consultation at the request of  No ref. provider found  for evaluation of rectal cancer  02/05/2021-02/06/2021 patient was hospitalized due to generalized weakness, intermittent lightheadedness, weight loss and worsening constipation.  02/05/2021 CT abdomen showed concerning of severe rectal wall thickening and right internal iliac lymph node concerning for metastatic disease.  Patient was seen by gastroenterology and had colonoscopy which showed a circumferential fungating mass in the rectum.  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  02/14/2021-02/16/2021 hospitalized due to rectal bleeding and pain.   02/14/2021 CT showed perirectal fluid collection/gas concerning for infection with significant leukocytosis, anemia with hemoglobin of 6.8.  Patient received PRBC transfusion, IV antibiotics.  He underwent an IR guided placement of JP drain into the rectal abscess.  Discharged home with oral Augmentin. 02/20/2021 presented to ER with new skin opening and draining from left gluteus.  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  JP drain has been removed.  Patient was continued on Augmentin.  02/13/2021, PET scan showed locally advanced rectal cancer with billowing of the mesorectum now with low attenuation material that was not present on previous examination with extensive stranding and inflammation.   Bulky RIGHT pelvic sidewall/hypogastric lymph node outside of the mesorectum with stippled calcification measuring 19 mm-no increased metabolic activity. Small LEFT hypogastric lymph node just peripheral to the internal  external bifurcation-SUV 3.2 High RIGHT internal just below or at the internal/common iliac transition, lymph node -SUV 4.8 LEFT high hypogastric lymph node  8 mm-SUV 3. Scattered lymph nodes throughout the retroperitoneum with low FDG uptake Bilateral inguinal lymph nodes largest on the RIGHT (image 248/3) 11 mm with a maximum SUV of 2.8  spiculated nodule in the LEFT upper lobe- 9 x 8 mm  02/14/2021, CT abdomen pelvis without contrast Showed perforated rectal mass with contained perforation with fluid and gas extending above and below the pelvic floor,potentially involving the sphincter complex and extending into LEFT ischial rectal fossa  02/20/2021, CT pelvis with contrast showed Large perirectal/perianal abscess has markedly decreased in size since placement of the percutaneous drain. There are residual gas-filled collections in the soft tissues and suspect there is a fistula or sinus tract between the rectal mass and the subcutaneous tissues. Soft tissue gas along the medial left buttock and concern for a cutaneous ulceration in this area. Large rectal mass with evidence for a large necrotic right pelvic lymph node.   02/27/2021 medi port placed by Dr.Dew 03/13/2021 reports right butt cheek and also left perianal area fullness.he was seen by Dr.Pabon urgently  and had right buttock abscess drained. Left perianal fullness was felt to be due to cancer.   03/13/2021 started [place]  # He was seen by [name] Dr.Mantyh who agrees with TNT followed by surgery. # Emphysema, recommend him to establish care with PCP. He was provided with a list of PCP office contact information.   INTERVAL HISTORY Barry Mccaffrey. is a 55 y.o. male who has above history reviewed by me today presents for follow up visit for management of rectal cancer. Patient reports doing well.  Patient has no nausea vomiting diarrhea. He noticed a  bulging area to lower abdomen, nontender. Rectal pain is controlled with current pain  regimen.   Review of Systems  Constitutional:  Negative for appetite change, chills, fatigue, fever and unexpected weight change.  HENT:   Negative for hearing loss and voice change.   Eyes:  Negative for eye problems and icterus.  Respiratory:  Negative for chest tightness, cough and shortness of breath.   Cardiovascular:  Negative for chest pain and leg swelling.  Gastrointestinal:  Positive for rectal pain. Negative for abdominal distention, abdominal pain, blood in stool, constipation and diarrhea.  Endocrine: Negative for hot flashes.  Genitourinary:  Negative for difficulty urinating, dysuria and frequency.   Musculoskeletal:  Negative for arthralgias.  Skin:  Negative for itching and rash.  Neurological:  Negative for light-headedness and numbness.  Hematological:  Negative for adenopathy. Does not bruise/bleed easily.  Psychiatric/Behavioral:  Negative for confusion.    MEDICAL HISTORY:  Past Medical History:  Diagnosis Date   Headache    Left shoulder pain    Rectal cancer ([place])     SURGICAL HISTORY: Past Surgical History:  Procedure Laterality Date   COLONOSCOPY WITH PROPOFOL N/A 02/06/2021   Procedure: COLONOSCOPY WITH PROPOFOL;  Surgeon: Barry Landsman, MD;  Location: [facility] ENDOSCOPY;  Service: Gastroenterology;  Laterality: N/A;   PORTA CATH INSERTION N/A 02/27/2021   Procedure: PORTA CATH INSERTION;  Surgeon: Algernon Huxley, MD;  Location: [place] [facility];  Service: Cardiovascular;  Laterality: N/A;   ROTATOR CUFF REPAIR Left     SOCIAL HISTORY: Social History   Socioeconomic History   Marital status: Married    Spouse name: Not on file   Number of children: Not on file   Years of education: Not on file   Highest education level: Not on file  Occupational History   Not on file  Tobacco Use   Smoking status: Former    Packs/day: 1.00    Years: 10.00    Pack years: 10.00    Types: Cigars, Cigarettes    Quit date: 02/04/2021    Years since quitting:  0.4   Smokeless tobacco: Never  Substance and Sexual Activity   Alcohol use: Not Currently   Drug use: No   Sexual activity: Not on file  Other Topics Concern   Not on file  Social History Narrative   Not on file   Social Determinants of Health   Financial Resource Strain: Not on file  Food Insecurity: Not on file  Transportation Needs: Not on file  Physical Activity: Not on file  Stress: Not on file  Social Connections: Not on file  Intimate Partner Violence: Not on file    FAMILY HISTORY: Family History  Problem Relation Age of Onset   Cancer Sister    Diabetes Mother    Cancer Maternal Grandmother    Cancer Paternal Grandmother     ALLERGIES:  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  MEDICATIONS:  Current Outpatient Medications  Medication Sig Dispense Refill   capecitabine (XELODA) 500 MG tablet Take 3 tablets (1,500 mg total) by mouth 2 (two) times daily after a meal. Take Monday-Friday. Take only on days of radiation. 168 tablet 0   feeding supplement (ENSURE ENLIVE / ENSURE PLUS) LIQD Take 237 mLs by mouth daily. 237 mL 12   fentaNYL (DURAGESIC) 25 MCG/HR Place 1 patch onto the skin every 3 (three) days. 10 patch 0   lidocaine-prilocaine (EMLA) cream Apply to affected area once 30 g 3   loperamide (IMODIUM) 2  MG capsule Take 1 capsule (2 mg total) by mouth See admin instructions. Take 2 tablets with onset of diarrhea, then 1 tab after every loose bowel movement. Maximum dose 8 tablets within 24 hours. 90 capsule 0   nicotine (NICODERM CQ - DOSED IN MG/24 HOURS) 21 mg/24hr patch Place 1 patch (21 mg total) onto the skin daily. 28 patch 0   ondansetron (ZOFRAN) 8 MG tablet Take 1 tablet (8 mg total) by mouth 2 (two) times daily as needed for refractory nausea / vomiting. Start on day 3 after chemotherapy. 30 tablet 1   prochlorperazine (COMPAZINE) 10 MG tablet Take 1 tablet (10 mg total) by mouth every 6 (six) hours as needed (Nausea or vomiting). 30 tablet 1    senna-docusate (SENOKOT-S) 8.6-50 MG tablet Take 2 tablets by mouth daily. (Patient not taking: No sig reported) 120 tablet 0   No current facility-administered medications for this visit.     PHYSICAL EXAMINATION: ECOG PERFORMANCE STATUS: 1 - Symptomatic but completely ambulatory Vitals:   07/24/21 0928  BP: 140/84  Pulse: 96  Temp: 98.2 F (36.8 C)     Filed Weights   07/24/21 0928  Weight: 154 lb (69.9 kg)     Physical Exam Constitutional:      Comments: he ambulates independently.  HENT:     Head: Normocephalic and atraumatic.  Eyes:     General: No scleral icterus. Cardiovascular:     Rate and Rhythm: Normal rate and regular rhythm.     Heart sounds: Normal heart sounds.  Pulmonary:     Effort: Pulmonary effort is normal. No respiratory distress.     Breath sounds: No wheezing.     Comments: Decreased breath sound bilaterally.  Abdominal:     General: Bowel sounds are normal. There is no distension.     Palpations: Abdomen is soft.  Musculoskeletal:        General: No deformity. Normal range of motion.     Cervical back: Normal range of motion and neck supple.  Skin:    General: Skin is warm and dry.     Findings: No erythema or rash.  Neurological:     Mental Status: He is alert and oriented to person, place, and time. Mental status is at baseline.     Cranial Nerves: No cranial nerve deficit.     Coordination: Coordination normal.  Psychiatric:        Mood and Affect: Mood normal.    LABORATORY DATA:  I have reviewed the data as listed Lab Results  Component Value Date   WBC 2.8 (L) 07/24/2021   HGB 11.8 (L) 07/24/2021   HCT 35.3 (L) 07/24/2021   MCV 89.6 07/24/2021   PLT 219 07/24/2021   Recent Labs    07/03/21 0751 07/17/21 0824 07/24/21 0841  NA 134* 135 133*  K 3.8 3.7 4.0  CL 100 100 101  CO2 26 25 24   GLUCOSE 172* 195* 144*  BUN 14 7 13   CREATININE 0.92 0.93 0.87  CALCIUM 8.9 8.8* 8.6*  GFRNONAA >60 >60 >60  PROT 7.8 7.5 7.9   ALBUMIN 3.2* 3.3* 3.3*  AST 26 33 34  ALT 24 23 26   ALKPHOS 100 94 91  BILITOT 0.3 0.2* 0.4    Iron/TIBC/Ferritin/ %Sat    Component Value Date/Time   IRON 28 (L) 03/25/2021 1029   TIBC 356 03/25/2021 1029   FERRITIN 118 03/25/2021 1029   IRONPCTSAT 8 (L) 03/25/2021 1029      RADIOGRAPHIC  STUDIES: I have personally reviewed the radiological images as listed and agreed with the findings in the report. No results found.      ASSESSMENT & PLAN:  1. Encounter for antineoplastic chemotherapy   2. Rectal cancer ([place])   3. Neoplasm related pain   4. Chemotherapy induced neutropenia ([facility])   Cancer Staging Rectal cancer [facility]) Staging form: Colon and Rectum, AJCC 8th Edition - Clinical stage from 02/08/2021: Stage IIIB (cT4a, cN1, cM0) - Signed by Earlie Server, MD on 03/06/2021   #locally advanced rectal cancer. His case is complicated with a perirectal abscess. Prior to onset of perirectal abscess, on his 02/05/2021 scan, he was noted to have right internal iliac lymph node 3 x 1 x 2.3 cm which is concerning for nodal disease.On Subsequent images it is difficult to distinguish whether lymphadenopathy was due to nodal disease versus acute inflammation. 03/07/2021 MRI pelvis cT3 N2. Due to the possible contained perforation on previous CT, possible cT4 disease.  He is on TNT neoadjuvant protocol.  Labs reviewed and discussed with patient. Continue Xeloda 1500 mg twice daily on radiation days.  #Chemotherapy-induced neutropenia, ANC is 1.1. Will repeat CBC weekly.  Holding Xeloda if ANC drops below 1.  # Diarrhea, likely due to chemotherapy negative C diff. Continue as needed Imodium as instructed.  #Spiculated lung nodule, likely primary lung cancer. Discussed with IR, CT guided biopsy is difficult given the position and small size. Will monitor for now. His rectal cancer treatment takes priority. Plan to check CT chest before surgery to access stability  #Iron deficiency  anemia Status post IV Venofer treatments, off oral iron supplementation due to constipation.  Hemoglobin 11.8, stable.  #Rectal pain, dose reduce to 62mcg fentanyl patch patch.    He declined influenza vaccination  Follow up in 1 week for lab NP Follow-up in 2 weeks lab MD All questions were answered. The patient knows to call the clinic with any problems questions or concerns.    Earlie Server, MD, PhD 07/24/2021

## 2021-07-25 ENCOUNTER — Ambulatory Visit
Admission: RE | Admit: 2021-07-25 | Discharge: 2021-07-25 | Disposition: A | Discharge status: Home / Self Care | Payer: Self-pay | Source: Ambulatory Visit | Attending: Radiation Oncology | Admitting: Radiation Oncology

## 2021-07-26 ENCOUNTER — Ambulatory Visit
Admission: RE | Admit: 2021-07-26 | Discharge: 2021-07-26 | Disposition: A | Discharge status: Home / Self Care | Payer: Self-pay | Source: Ambulatory Visit | Attending: Radiation Oncology | Admitting: Radiation Oncology

## 2021-07-29 ENCOUNTER — Other Ambulatory Visit: Payer: Self-pay

## 2021-07-29 ENCOUNTER — Inpatient Hospital Stay (HOSPITAL_BASED_OUTPATIENT_CLINIC_OR_DEPARTMENT_OTHER): Payer: Self-pay | Admitting: Oncology

## 2021-07-29 ENCOUNTER — Inpatient Hospital Stay: Payer: Self-pay

## 2021-07-29 ENCOUNTER — Ambulatory Visit
Admission: RE | Admit: 2021-07-29 | Discharge: 2021-07-29 | Disposition: A | Discharge status: Home / Self Care | Payer: Self-pay | Source: Ambulatory Visit | Attending: Radiation Oncology | Admitting: Radiation Oncology

## 2021-07-29 VITALS — BP 128/85 | HR 97 | Temp 97.8°F | Resp 17 | Wt 154.0 lb

## 2021-07-29 DIAGNOSIS — C20 Malignant neoplasm of rectum: Secondary | ICD-10-CM

## 2021-07-29 LAB — COMPREHENSIVE METABOLIC PANEL WITH GFR
ALT: 32 U/L (ref 0–44)
AST: 31 U/L (ref 15–41)
Albumin: 3.5 g/dL (ref 3.5–5.0)
Alkaline Phosphatase: 97 U/L (ref 38–126)
Anion gap: 7 (ref 5–15)
BUN: 10 mg/dL (ref 6–20)
CO2: 28 mmol/L (ref 22–32)
Calcium: 8.9 mg/dL (ref 8.9–10.3)
Chloride: 100 mmol/L (ref 98–111)
Creatinine, Ser: 1.03 mg/dL (ref 0.61–1.24)
GFR, Estimated: >60 mL/min (ref >60)
Glucose, Bld: 97 mg/dL (ref 70–99)
Potassium: 4.6 mmol/L (ref 3.5–5.1)
Sodium: 135 mmol/L (ref 135–145)
Total Bilirubin: 0.4 mg/dL (ref 0.3–1.2)
Total Protein: 8.2 g/dL — ABNORMAL HIGH (ref 6.5–8.1)

## 2021-07-29 LAB — CBC WITH DIFFERENTIAL/PLATELET
Abs Immature Granulocytes: 0.02 K/uL (ref 0.00–0.07)
Basophils Absolute: 0 K/uL (ref 0.0–0.1)
Basophils Relative: 1 %
Eosinophils Absolute: 0.4 K/uL (ref 0.0–0.5)
Eosinophils Relative: 8 %
HCT: 37.5 % — ABNORMAL LOW (ref 39.0–52.0)
Hemoglobin: 12.4 g/dL — ABNORMAL LOW (ref 13.0–17.0)
Immature Granulocytes: 0 %
Lymphocytes Relative: 21 %
Lymphs Abs: 1 K/uL (ref 0.7–4.0)
MCH: 29.9 pg (ref 26.0–34.0)
MCHC: 33.1 g/dL (ref 30.0–36.0)
MCV: 90.4 fL (ref 80.0–100.0)
Monocytes Absolute: 1.1 K/uL — ABNORMAL HIGH (ref 0.1–1.0)
Monocytes Relative: 23 %
Neutro Abs: 2.1 K/uL (ref 1.7–7.7)
Neutrophils Relative %: 47 %
Platelets: 183 K/uL (ref 150–400)
RBC: 4.15 MIL/uL — ABNORMAL LOW (ref 4.22–5.81)
RDW: 19.2 % — ABNORMAL HIGH (ref 11.5–15.5)
WBC: 4.5 K/uL (ref 4.0–10.5)
nRBC: 0 % (ref 0.0–0.2)

## 2021-07-29 NOTE — Progress Notes (Signed)
Patient here for oncology follow-up appointment, concerns  of loose stools

## 2021-07-29 NOTE — Progress Notes (Signed)
Hematology/Oncology follow up note [phone] [phone]   Patient Care Team: Pcp, No as PCP - General [name], RN as Oncology Nurse Navigator  CHIEF COMPLAINTS/REASON FOR VISIT:  Follow up for rectal cancer treatment  HISTORY OF PRESENTING ILLNESS:  Barry [name]. is a  55 y.o.  male with PMH listed below was seen in consultation at the request of  No ref. provider found  for evaluation of rectal cancer  02/05/2021-02/06/2021 patient was hospitalized due to generalized weakness, intermittent lightheadedness, weight loss and worsening constipation.  02/05/2021 CT abdomen showed concerning of severe rectal wall thickening and right internal iliac lymph node concerning for metastatic disease.  Patient was seen by gastroenterology and had colonoscopy which showed a circumferential fungating mass in the rectum.  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  02/14/2021-02/16/2021 hospitalized due to rectal bleeding and pain.   02/14/2021 CT showed perirectal fluid collection/gas concerning for infection with significant leukocytosis, anemia with hemoglobin of 6.8.  Patient received PRBC transfusion, IV antibiotics.  He underwent an IR guided placement of JP drain into the rectal abscess.  Discharged home with oral Augmentin. 02/20/2021 presented to ER with new skin opening and draining from left gluteus.  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  JP drain has been removed.  Patient was continued on Augmentin.  02/13/2021, PET scan showed locally advanced rectal cancer with billowing of the mesorectum now with low attenuation material that was not present on previous examination with extensive stranding and inflammation.   Bulky RIGHT pelvic sidewall/hypogastric lymph node outside of the mesorectum with stippled calcification measuring 19 mm-no increased metabolic activity. Small LEFT hypogastric lymph node just peripheral to the internal external  bifurcation-SUV 3.2 High RIGHT internal just below or at the internal/common iliac transition, lymph node -SUV 4.8 LEFT high hypogastric lymph node  8 mm-SUV 3. Scattered lymph nodes throughout the retroperitoneum with low FDG uptake Bilateral inguinal lymph nodes largest on the RIGHT (image 248/3) 11 mm with a maximum SUV of 2.8  spiculated nodule in the LEFT upper lobe- 9 x 8 mm  02/14/2021, CT abdomen pelvis without contrast Showed perforated rectal mass with contained perforation with fluid and gas extending above and below the pelvic floor,potentially involving the sphincter complex and extending into LEFT ischial rectal fossa  02/20/2021, CT pelvis with contrast showed Large perirectal/perianal abscess has markedly decreased in size since placement of the percutaneous drain. There are residual gas-filled collections in the soft tissues and suspect there is a fistula or sinus tract between the rectal mass and the subcutaneous tissues. Soft tissue gas along the medial left buttock and concern for a cutaneous ulceration in this area. Large rectal mass with evidence for a large necrotic right pelvic lymph node.   02/27/2021 medi port placed by Dr.Dew 03/13/2021 reports right butt cheek and also left perianal area fullness.he was seen by Dr.Pabon urgently  and had right buttock abscess drained. Left perianal fullness was felt to be due to cancer.   03/13/2021 started [place]  # He was seen by [name] Dr.Mantyh who agrees with TNT followed by surgery. # Emphysema, recommend him to establish care with PCP. He was provided with a list of PCP office contact information.   INTERVAL HISTORY [name] is a 55 year old male with above history who presents for management of rectal cancer.  States he has been doing well.  He is tolerating Xeloda and radiation without major concerns.  Has intermittent diarrhea with blood occasionally.  Takes  Imodium as needed.  Rectal pain is stable.  Review of Systems   Constitutional: Negative.  Negative for appetite change, chills, fatigue and fever.  HENT:  Negative.  Negative for hearing loss, lump/mass, mouth sores and nosebleeds.   Eyes: Negative.  Negative for eye problems.  Respiratory:  Negative for cough, hemoptysis and shortness of breath.   Cardiovascular: Negative.  Negative for chest pain and leg swelling.  Gastrointestinal:  Positive for blood in stool and rectal pain. Negative for abdominal pain, constipation, diarrhea, nausea and vomiting.  Endocrine: Negative.  Negative for hot flashes.  Genitourinary: Negative.  Negative for bladder incontinence, difficulty urinating, dysuria, frequency and hematuria.   Musculoskeletal: Negative.  Negative for back pain, flank pain, gait problem and myalgias.  Skin: Negative.  Negative for itching and rash.  Neurological: Negative.  Negative for dizziness, gait problem, headaches, light-headedness and numbness.  Hematological: Negative.  Negative for adenopathy.  Psychiatric/Behavioral:  Negative for confusion. The patient is not nervous/anxious.    MEDICAL HISTORY:  Past Medical History:  Diagnosis Date   Headache    Left shoulder pain    Rectal cancer ([place])     SURGICAL HISTORY: Past Surgical History:  Procedure Laterality Date   COLONOSCOPY WITH PROPOFOL N/A 02/06/2021   Procedure: COLONOSCOPY WITH PROPOFOL;  Surgeon: Lin Landsman, MD;  Location: [facility] ENDOSCOPY;  Service: Gastroenterology;  Laterality: N/A;   PORTA CATH INSERTION N/A 02/27/2021   Procedure: PORTA CATH INSERTION;  Surgeon: Algernon Huxley, MD;  Location: [place] [facility];  Service: Cardiovascular;  Laterality: N/A;   ROTATOR CUFF REPAIR Left     SOCIAL HISTORY: Social History   Socioeconomic History   Marital status: Married    Spouse name: Not on file   Number of children: Not on file   Years of education: Not on file   Highest education level: Not on file  Occupational History   Not on file  Tobacco Use    Smoking status: Former    Packs/day: 1.00    Years: 10.00    Pack years: 10.00    Types: Cigars, Cigarettes    Quit date: 02/04/2021    Years since quitting: 0.4   Smokeless tobacco: Never  Substance and Sexual Activity   Alcohol use: Not Currently   Drug use: No   Sexual activity: Not on file  Other Topics Concern   Not on file  Social History Narrative   Not on file   Social Determinants of Health   Financial Resource Strain: Not on file  Food Insecurity: Not on file  Transportation Needs: Not on file  Physical Activity: Not on file  Stress: Not on file  Social Connections: Not on file  Intimate Partner Violence: Not on file    FAMILY HISTORY: Family History  Problem Relation Age of Onset   Cancer Sister    Diabetes Mother    Cancer Maternal Grandmother    Cancer Paternal Grandmother     ALLERGIES:  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  MEDICATIONS:  Current Outpatient Medications  Medication Sig Dispense Refill   capecitabine (XELODA) 500 MG tablet Take 3 tablets (1,500 mg total) by mouth 2 (two) times daily after a meal. Take Monday-Friday. Take only on days of radiation. 168 tablet 0   feeding supplement (ENSURE ENLIVE / ENSURE PLUS) LIQD Take 237 mLs by mouth daily. 237 mL 12   fentaNYL (DURAGESIC) 25 MCG/HR Place 1 patch onto the skin every 3 (three) days. 10 patch 0  lidocaine-prilocaine (EMLA) cream Apply to affected area once 30 g 3   loperamide (IMODIUM) 2 MG capsule Take 1 capsule (2 mg total) by mouth See admin instructions. Take 2 tablets with onset of diarrhea, then 1 tab after every loose bowel movement. Maximum dose 8 tablets within 24 hours. 90 capsule 0   nicotine (NICODERM CQ - DOSED IN MG/24 HOURS) 21 mg/24hr patch Place 1 patch (21 mg total) onto the skin daily. 28 patch 0   ondansetron (ZOFRAN) 8 MG tablet Take 1 tablet (8 mg total) by mouth 2 (two) times daily as needed for refractory nausea / vomiting. Start on day 3 after chemotherapy. 30 tablet  1   prochlorperazine (COMPAZINE) 10 MG tablet Take 1 tablet (10 mg total) by mouth every 6 (six) hours as needed (Nausea or vomiting). 30 tablet 1   senna-docusate (SENOKOT-S) 8.6-50 MG tablet Take 2 tablets by mouth daily. 120 tablet 0   No current facility-administered medications for this visit.     PHYSICAL EXAMINATION: ECOG PERFORMANCE STATUS: 1 - Symptomatic but completely ambulatory Vitals:   07/29/21 0950  BP: 128/85  Pulse: 97  Resp: 17  Temp: 97.8 F (36.6 C)  SpO2: 100%     Filed Weights   07/29/21 0950  Weight: 154 lb (69.9 kg)     Physical Exam Constitutional:      Appearance: Normal appearance.  HENT:     Head: Normocephalic and atraumatic.  Eyes:     Pupils: Pupils are equal, round, and reactive to light.  Cardiovascular:     Rate and Rhythm: Normal rate and regular rhythm.     Heart sounds: Normal heart sounds. No murmur heard. Pulmonary:     Effort: Pulmonary effort is normal.     Breath sounds: Normal breath sounds. No wheezing.  Abdominal:     General: Bowel sounds are normal. There is no distension.     Palpations: Abdomen is soft.     Tenderness: There is no abdominal tenderness.  Musculoskeletal:        General: Normal range of motion.     Cervical back: Normal range of motion.  Skin:    General: Skin is warm and dry.     Findings: No rash.  Neurological:     Mental Status: He is alert and oriented to person, place, and time.  Psychiatric:        Judgment: Judgment normal.    LABORATORY DATA:  I have reviewed the data as listed Lab Results  Component Value Date   WBC 4.5 07/29/2021   HGB 12.4 (L) 07/29/2021   HCT 37.5 (L) 07/29/2021   MCV 90.4 07/29/2021   PLT 183 07/29/2021   Recent Labs    07/03/21 0751 07/17/21 0824 07/24/21 0841  NA 134* 135 133*  K 3.8 3.7 4.0  CL 100 100 101  CO2 26 25 24   GLUCOSE 172* 195* 144*  BUN 14 7 13   CREATININE 0.92 0.93 0.87  CALCIUM 8.9 8.8* 8.6*  GFRNONAA >60 >60 >60  PROT 7.8 7.5  7.9  ALBUMIN 3.2* 3.3* 3.3*  AST 26 33 34  ALT 24 23 26   ALKPHOS 100 94 91  BILITOT 0.3 0.2* 0.4   Iron/TIBC/Ferritin/ %Sat    Component Value Date/Time   IRON 28 (L) 03/25/2021 1029   TIBC 356 03/25/2021 1029   FERRITIN 118 03/25/2021 1029   IRONPCTSAT 8 (L) 03/25/2021 1029      RADIOGRAPHIC STUDIES: I have personally reviewed the radiological images as  listed and agreed with the findings in the report. No results found.   ASSESSMENT & PLAN:  1. Rectal cancer [facility])    Cancer Staging  Rectal cancer [facility]) Staging form: Colon and Rectum, AJCC 8th Edition - Clinical stage from 02/08/2021: Stage IIIB (cT4a, cN1, cM0) - Signed by Earlie Server, MD on 03/06/2021   Locally advanced rectal cancer- S/p 8 cycles of folfox last given 07/03/21. Continue daily xrt.  He is currently stable on Xeloda 1500 mg twice daily on days of radiation. Treatment complicated due to perirectal abscess which has resolved.  Has MRI scheduled at [facility] on 08/06/2021. Labs from today reviewed with patient and are stable.  Hold Xeloda for ANC less than 1.  ANC is 2.1 today.  Diarrhea- Not daily. Had some diarrhea this morning. Occasional blood.  Due to radiation and chemotherapy.  C. difficile was negative. Continue Imodium as needed.  Spiculated lung nodule- Thought to be primary lung cancer. No biopsy due to location per IR. Will continue to monitor. Per Dr. Tasia Catchings, rectal cancer treatment takes priority.   Plan to check imaging on 08/06/21 at [facility] prior to surgery to assess stability.  Iron deficiency anemia- Status post several IV Venofer treatments. Unable to tolerate oral iron due to constipation. Hemoglobin is 12.4 today.  Rectal pain- Continue fentanyl patch.  Disposition- Return to clinic as scheduled next week.  Patient needs radiation appointment canceled for next week on 08/06/2021.  I spent 25 minutes dedicated to the care of this patient (face-to-face and non-face-to-face) on the date of  the encounter to include what is described in the assessment and plan.  Faythe Casa, NP 07/29/2021 10:14 AM

## 2021-07-30 ENCOUNTER — Ambulatory Visit
Admission: RE | Admit: 2021-07-30 | Discharge: 2021-07-30 | Disposition: A | Discharge status: Home / Self Care | Payer: Self-pay | Source: Ambulatory Visit | Attending: Radiation Oncology | Admitting: Radiation Oncology

## 2021-07-30 ENCOUNTER — Inpatient Hospital Stay: Payer: Self-pay

## 2021-07-30 ENCOUNTER — Inpatient Hospital Stay: Payer: Self-pay | Admitting: Nurse Practitioner

## 2021-07-31 ENCOUNTER — Ambulatory Visit
Admission: RE | Admit: 2021-07-31 | Discharge: 2021-07-31 | Disposition: A | Discharge status: Home / Self Care | Payer: Self-pay | Source: Ambulatory Visit | Attending: Radiation Oncology | Admitting: Radiation Oncology

## 2021-08-05 ENCOUNTER — Ambulatory Visit
Admission: RE | Admit: 2021-08-05 | Discharge: 2021-08-05 | Disposition: A | Discharge status: Home / Self Care | Payer: Self-pay | Source: Ambulatory Visit | Attending: Radiation Oncology | Admitting: Radiation Oncology

## 2021-08-06 ENCOUNTER — Ambulatory Visit: Payer: Self-pay

## 2021-08-06 ENCOUNTER — Ambulatory Visit
Admission: RE | Admit: 2021-08-06 | Discharge: 2021-08-06 | Disposition: A | Discharge status: Home / Self Care | Payer: Self-pay | Source: Ambulatory Visit | Attending: Radiation Oncology | Admitting: Radiation Oncology

## 2021-08-07 ENCOUNTER — Other Ambulatory Visit: Payer: Self-pay

## 2021-08-07 ENCOUNTER — Ambulatory Visit
Admission: RE | Admit: 2021-08-07 | Discharge: 2021-08-07 | Disposition: A | Discharge status: Home / Self Care | Payer: Self-pay | Source: Ambulatory Visit | Attending: Radiation Oncology | Admitting: Radiation Oncology

## 2021-08-07 ENCOUNTER — Ambulatory Visit: Payer: Self-pay | Admitting: Oncology

## 2021-08-08 ENCOUNTER — Ambulatory Visit
Admission: RE | Admit: 2021-08-08 | Discharge: 2021-08-08 | Disposition: A | Discharge status: Home / Self Care | Payer: Self-pay | Source: Ambulatory Visit | Attending: Radiation Oncology | Admitting: Radiation Oncology

## 2021-08-08 DIAGNOSIS — C20 Malignant neoplasm of rectum: Secondary | ICD-10-CM | POA: Insufficient documentation

## 2021-08-08 DIAGNOSIS — Z510 Encounter for antineoplastic radiation therapy: Secondary | ICD-10-CM | POA: Insufficient documentation

## 2021-08-09 ENCOUNTER — Ambulatory Visit
Admission: RE | Admit: 2021-08-09 | Discharge: 2021-08-09 | Disposition: A | Discharge status: Home / Self Care | Payer: Self-pay | Source: Ambulatory Visit | Attending: Radiation Oncology | Admitting: Radiation Oncology

## 2021-08-12 ENCOUNTER — Ambulatory Visit
Admission: RE | Admit: 2021-08-12 | Discharge: 2021-08-12 | Disposition: A | Discharge status: Home / Self Care | Payer: Self-pay | Source: Ambulatory Visit | Attending: Radiation Oncology | Admitting: Radiation Oncology

## 2021-08-13 ENCOUNTER — Ambulatory Visit
Admission: RE | Admit: 2021-08-13 | Discharge: 2021-08-13 | Disposition: A | Discharge status: Home / Self Care | Payer: Self-pay | Source: Ambulatory Visit | Attending: Radiation Oncology | Admitting: Radiation Oncology

## 2021-08-14 ENCOUNTER — Inpatient Hospital Stay (HOSPITAL_BASED_OUTPATIENT_CLINIC_OR_DEPARTMENT_OTHER): Payer: Self-pay | Admitting: Oncology

## 2021-08-14 ENCOUNTER — Encounter: Payer: Self-pay | Admitting: Oncology

## 2021-08-14 ENCOUNTER — Ambulatory Visit
Admission: RE | Admit: 2021-08-14 | Discharge: 2021-08-14 | Disposition: A | Discharge status: Home / Self Care | Payer: Self-pay | Source: Ambulatory Visit | Attending: Radiation Oncology | Admitting: Radiation Oncology

## 2021-08-14 ENCOUNTER — Other Ambulatory Visit: Payer: Self-pay

## 2021-08-14 ENCOUNTER — Inpatient Hospital Stay: Payer: Self-pay | Attending: Oncology

## 2021-08-14 VITALS — BP 122/88 | HR 93 | Temp 97.8°F | Wt 154.5 lb

## 2021-08-14 DIAGNOSIS — Z452 Encounter for adjustment and management of vascular access device: Secondary | ICD-10-CM | POA: Insufficient documentation

## 2021-08-14 DIAGNOSIS — Z79899 Other long term (current) drug therapy: Secondary | ICD-10-CM | POA: Insufficient documentation

## 2021-08-14 DIAGNOSIS — R911 Solitary pulmonary nodule: Secondary | ICD-10-CM | POA: Insufficient documentation

## 2021-08-14 DIAGNOSIS — Z5111 Encounter for antineoplastic chemotherapy: Secondary | ICD-10-CM

## 2021-08-14 DIAGNOSIS — Z79891 Long term (current) use of opiate analgesic: Secondary | ICD-10-CM | POA: Insufficient documentation

## 2021-08-14 DIAGNOSIS — C20 Malignant neoplasm of rectum: Secondary | ICD-10-CM | POA: Insufficient documentation

## 2021-08-14 DIAGNOSIS — J439 Emphysema, unspecified: Secondary | ICD-10-CM | POA: Insufficient documentation

## 2021-08-14 DIAGNOSIS — G893 Neoplasm related pain (acute) (chronic): Secondary | ICD-10-CM | POA: Insufficient documentation

## 2021-08-14 DIAGNOSIS — Z87891 Personal history of nicotine dependence: Secondary | ICD-10-CM | POA: Insufficient documentation

## 2021-08-14 DIAGNOSIS — K611 Rectal abscess: Secondary | ICD-10-CM | POA: Insufficient documentation

## 2021-08-14 DIAGNOSIS — D701 Agranulocytosis secondary to cancer chemotherapy: Secondary | ICD-10-CM

## 2021-08-14 DIAGNOSIS — D509 Iron deficiency anemia, unspecified: Secondary | ICD-10-CM | POA: Insufficient documentation

## 2021-08-14 LAB — CBC WITH DIFFERENTIAL/PLATELET
Abs Immature Granulocytes: 0.01 10*3/uL (ref 0.00–0.07)
Basophils Absolute: 0 10*3/uL (ref 0.0–0.1)
Basophils Relative: 0 %
Eosinophils Absolute: 0.3 10*3/uL (ref 0.0–0.5)
Eosinophils Relative: 9 %
HCT: 34.5 % — ABNORMAL LOW (ref 39.0–52.0)
Hemoglobin: 11.7 g/dL — ABNORMAL LOW (ref 13.0–17.0)
Immature Granulocytes: 0 %
Lymphocytes Relative: 19 %
Lymphs Abs: 0.6 10*3/uL — ABNORMAL LOW (ref 0.7–4.0)
MCH: 31.5 pg (ref 26.0–34.0)
MCHC: 33.9 g/dL (ref 30.0–36.0)
MCV: 92.7 fL (ref 80.0–100.0)
Monocytes Absolute: 0.7 10*3/uL (ref 0.1–1.0)
Monocytes Relative: 24 %
Neutro Abs: 1.5 10*3/uL — ABNORMAL LOW (ref 1.7–7.7)
Neutrophils Relative %: 48 %
Platelets: 259 10*3/uL (ref 150–400)
RBC: 3.72 MIL/uL — ABNORMAL LOW (ref 4.22–5.81)
RDW: 18.9 % — ABNORMAL HIGH (ref 11.5–15.5)
WBC: 3.1 10*3/uL — ABNORMAL LOW (ref 4.0–10.5)
nRBC: 0 % (ref 0.0–0.2)

## 2021-08-14 LAB — COMPREHENSIVE METABOLIC PANEL
ALT: 21 U/L (ref 0–44)
AST: 24 U/L (ref 15–41)
Albumin: 3.6 g/dL (ref 3.5–5.0)
Alkaline Phosphatase: 94 U/L (ref 38–126)
Anion gap: 8 (ref 5–15)
BUN: 10 mg/dL (ref 6–20)
CO2: 29 mmol/L (ref 22–32)
Calcium: 9.4 mg/dL (ref 8.9–10.3)
Chloride: 101 mmol/L (ref 98–111)
Creatinine, Ser: 0.85 mg/dL (ref 0.61–1.24)
GFR, Estimated: >60 mL/min (ref >60)
Glucose, Bld: 108 mg/dL — ABNORMAL HIGH (ref 70–99)
Potassium: 4 mmol/L (ref 3.5–5.1)
Sodium: 138 mmol/L (ref 135–145)
Total Bilirubin: 0.3 mg/dL (ref 0.3–1.2)
Total Protein: 7.9 g/dL (ref 6.5–8.1)

## 2021-08-14 MED ORDER — FENTANYL 25 MCG/HR TD PT72
1.0000 | MEDICATED_PATCH | TRANSDERMAL | 0 refills | Status: DC
  Filled 2021-08-14 – 2021-08-15 (×3): qty 10, 30d supply, fill #0

## 2021-08-14 NOTE — Progress Notes (Signed)
Hematology/Oncology follow up note [phone] [phone]   [name] Care Team: Pcp, No as PCP - General [name], RN as Oncology Nurse Navigator  CHIEF COMPLAINTS/REASON FOR VISIT:  Follow up for rectal cancer treatment  HISTORY OF PRESENTING ILLNESS:   [name]. is a  55 y.o.  male with PMH listed below was seen in consultation at the request of  No ref. provider found  for evaluation of rectal cancer  02/05/2021-02/06/2021 [name] was hospitalized due to generalized weakness, intermittent lightheadedness, weight loss and worsening constipation.  02/05/2021 CT abdomen showed concerning of severe rectal wall thickening and right internal iliac lymph node concerning for metastatic disease.  [name] was seen by gastroenterology and had colonoscopy which showed a circumferential fungating mass in the rectum.  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  02/14/2021-02/16/2021 hospitalized due to rectal bleeding and pain.   02/14/2021 CT showed perirectal fluid collection/gas concerning for infection with significant leukocytosis, anemia with hemoglobin of 6.8.  [name] received PRBC transfusion, IV antibiotics.  He underwent an IR guided placement of JP drain into the rectal abscess.  Discharged home with oral Augmentin. 02/20/2021 presented to ER with new skin opening and draining from left gluteus.  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  JP drain has been removed.  [name] was continued on Augmentin.  02/13/2021, PET scan showed locally advanced rectal cancer with billowing of the mesorectum now with low attenuation material that was not present on previous examination with extensive stranding and inflammation.   Bulky RIGHT pelvic sidewall/hypogastric lymph node outside of the mesorectum with stippled calcification measuring 19 mm-no increased metabolic activity. Small LEFT hypogastric lymph node just peripheral to the internal  external bifurcation-SUV 3.2 High RIGHT internal just below or at the internal/common iliac transition, lymph node -SUV 4.8 LEFT high hypogastric lymph node  8 mm-SUV 3. Scattered lymph nodes throughout the retroperitoneum with low FDG uptake Bilateral inguinal lymph nodes largest on the RIGHT (image 248/3) 11 mm with a maximum SUV of 2.8  spiculated nodule in the LEFT upper lobe- 9 x 8 mm  02/14/2021, CT abdomen pelvis without contrast Showed perforated rectal mass with contained perforation with fluid and gas extending above and below the pelvic floor,potentially involving the sphincter complex and extending into LEFT ischial rectal fossa  02/20/2021, CT pelvis with contrast showed Large perirectal/perianal abscess has markedly decreased in size since placement of the percutaneous drain. There are residual gas-filled collections in the soft tissues and suspect there is a fistula or sinus tract between the rectal mass and the subcutaneous tissues. Soft tissue gas along the medial left buttock and concern for a cutaneous ulceration in this area. Large rectal mass with evidence for a large necrotic right pelvic lymph node.   02/27/2021 medi port placed by Dr.Dew 03/13/2021 reports right butt cheek and also left perianal area fullness.he was seen by Dr.Pabon urgently  and had right buttock abscess drained. Left perianal fullness was felt to be due to cancer.   03/13/2021 started [facility]  # He was seen by [name] Dr.Mantyh who agrees with TNT followed by surgery. # Emphysema, recommend him to establish care with PCP. He was provided with a list of PCP office contact information.   INTERVAL HISTORY [name]. is a 55 y.o. male who has above history reviewed by me today presents for follow up visit for management of rectal cancer. [name] is currently on concurrent Xeloda with radiation.  So far he tolerates well.  Denies any nausea vomiting diarrhea. Rectal pain is controlled with current pain  regimen.   Review of Systems  Constitutional:  Negative for appetite change, chills, fatigue, fever and unexpected weight change.  HENT:   Negative for hearing loss and voice change.   Eyes:  Negative for eye problems and icterus.  Respiratory:  Negative for chest tightness, cough and shortness of breath.   Cardiovascular:  Negative for chest pain and leg swelling.  Gastrointestinal:  Positive for rectal pain. Negative for abdominal distention, abdominal pain, blood in stool, constipation and diarrhea.  Endocrine: Negative for hot flashes.  Genitourinary:  Negative for difficulty urinating, dysuria and frequency.   Musculoskeletal:  Negative for arthralgias.  Skin:  Negative for itching and rash.  Neurological:  Negative for light-headedness and numbness.  Hematological:  Negative for adenopathy. Does not bruise/bleed easily.  Psychiatric/Behavioral:  Negative for confusion.    MEDICAL HISTORY:  Past Medical History:  Diagnosis Date   Headache    Left shoulder pain    Rectal cancer ([place])     SURGICAL HISTORY: Past Surgical History:  Procedure Laterality Date   COLONOSCOPY WITH PROPOFOL N/A 02/06/2021   Procedure: COLONOSCOPY WITH PROPOFOL;  Surgeon: Lin Landsman, MD;  Location: [facility] ENDOSCOPY;  Service: Gastroenterology;  Laterality: N/A;   PORTA CATH INSERTION N/A 02/27/2021   Procedure: PORTA CATH INSERTION;  Surgeon: Algernon Huxley, MD;  Location: [facility] LAB;  Service: Cardiovascular;  Laterality: N/A;   ROTATOR CUFF REPAIR Left     SOCIAL HISTORY: Social History   Socioeconomic History   Marital status: Married    Spouse name: Not on file   Number of children: Not on file   Years of education: Not on file   Highest education level: Not on file  Occupational History   Not on file  Tobacco Use   Smoking status: Former    Packs/day: 1.00    Years: 10.00    Pack years: 10.00    Types: Cigars, Cigarettes    Quit date: 02/04/2021    Years since quitting:  0.5   Smokeless tobacco: Never  Substance and Sexual Activity   Alcohol use: Not Currently   Drug use: No   Sexual activity: Not on file  Other Topics Concern   Not on file  Social History Narrative   Not on file   Social Determinants of Health   Financial Resource Strain: Not on file  Food Insecurity: Not on file  Transportation Needs: Not on file  Physical Activity: Not on file  Stress: Not on file  Social Connections: Not on file  Intimate Partner Violence: Not on file    FAMILY HISTORY: Family History  Problem Relation Age of Onset   Cancer Sister    Diabetes Mother    Cancer Maternal Grandmother    Cancer Paternal Grandmother     ALLERGIES:  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  MEDICATIONS:  Current Outpatient Medications  Medication Sig Dispense Refill   capecitabine (XELODA) 500 MG tablet Take 3 tablets (1,500 mg total) by mouth 2 (two) times daily after a meal. Take Monday-Friday. Take only on days of radiation. 168 tablet 0   feeding supplement (ENSURE ENLIVE / ENSURE PLUS) LIQD Take 237 mLs by mouth daily. 237 mL 12   fentaNYL (DURAGESIC) 25 MCG/HR Place 1 patch onto the skin every 3 (three) days. 10 patch 0   lidocaine-prilocaine (EMLA) cream Apply to affected area once 30 g 3   loperamide (IMODIUM) 2 MG  capsule Take 1 capsule (2 mg total) by mouth See admin instructions. Take 2 tablets with onset of diarrhea, then 1 tab after every loose bowel movement. Maximum dose 8 tablets within 24 hours. 90 capsule 0   nicotine (NICODERM CQ - DOSED IN MG/24 HOURS) 21 mg/24hr patch Place 1 patch (21 mg total) onto the skin daily. 28 patch 0   ondansetron (ZOFRAN) 8 MG tablet Take 1 tablet (8 mg total) by mouth 2 (two) times daily as needed for refractory nausea / vomiting. Start on day 3 after chemotherapy. 30 tablet 1   prochlorperazine (COMPAZINE) 10 MG tablet Take 1 tablet (10 mg total) by mouth every 6 (six) hours as needed (Nausea or vomiting). 30 tablet 1    senna-docusate (SENOKOT-S) 8.6-50 MG tablet Take 2 tablets by mouth daily. 120 tablet 0   No current facility-administered medications for this visit.     PHYSICAL EXAMINATION: ECOG PERFORMANCE STATUS: 1 - Symptomatic but completely ambulatory Vitals:   08/14/21 0933  BP: 122/88  Pulse: 93  Temp: 97.8 F (36.6 C)     Filed Weights   08/14/21 0933  Weight: 154 lb 8 oz (70.1 kg)     Physical Exam Constitutional:      Comments: he ambulates independently.  HENT:     Head: Normocephalic and atraumatic.  Eyes:     General: No scleral icterus. Cardiovascular:     Rate and Rhythm: Normal rate and regular rhythm.     Heart sounds: Normal heart sounds.  Pulmonary:     Effort: Pulmonary effort is normal. No respiratory distress.     Breath sounds: No wheezing.     Comments: Decreased breath sound bilaterally.  Abdominal:     General: Bowel sounds are normal. There is no distension.     Palpations: Abdomen is soft.  Musculoskeletal:        General: No deformity. Normal range of motion.     Cervical back: Normal range of motion and neck supple.  Skin:    General: Skin is warm and dry.     Findings: No erythema or rash.  Neurological:     Mental Status: He is alert and oriented to person, place, and time. Mental status is at baseline.     Cranial Nerves: No cranial nerve deficit.     Coordination: Coordination normal.  Psychiatric:        Mood and Affect: Mood normal.    LABORATORY DATA:  I have reviewed the data as listed Lab Results  Component Value Date   WBC 3.1 (L) 08/14/2021   HGB 11.7 (L) 08/14/2021   HCT 34.5 (L) 08/14/2021   MCV 92.7 08/14/2021   PLT 259 08/14/2021   Recent Labs    07/24/21 0841 07/29/21 0929 08/14/21 0918  NA 133* 135 138  K 4.0 4.6 4.0  CL 101 100 101  CO2 24 28 29   GLUCOSE 144* 97 108*  BUN 13 10 10   CREATININE 0.87 1.03 0.85  CALCIUM 8.6* 8.9 9.4  GFRNONAA >60 >60 >60  PROT 7.9 8.2* 7.9  ALBUMIN 3.3* 3.5 3.6  AST 34 31  24  ALT 26 32 21  ALKPHOS 91 97 94  BILITOT 0.4 0.4 0.3    Iron/TIBC/Ferritin/ %Sat    Component Value Date/Time   IRON 28 (L) 03/25/2021 1029   TIBC 356 03/25/2021 1029   FERRITIN 118 03/25/2021 1029   IRONPCTSAT 8 (L) 03/25/2021 1029      RADIOGRAPHIC STUDIES: I have personally reviewed  the radiological images as listed and agreed with the findings in the report. No results found.      ASSESSMENT & PLAN:  1. Rectal cancer ([place])   2. Encounter for antineoplastic chemotherapy   3. Neoplasm related pain   4. Lung nodule    Cancer Staging  Rectal cancer [facility]) Staging form: Colon and Rectum, AJCC 8th Edition - Clinical stage from 02/08/2021: Stage IIIB (cT4a, cN1, cM0) - Signed by Earlie Server, MD on 03/06/2021   #locally advanced rectal cancer. His case is complicated with a perirectal abscess. Prior to onset of perirectal abscess, on his 02/05/2021 scan, he was noted to have right internal iliac lymph node 3 x 1 x 2.3 cm which is concerning for nodal disease.On Subsequent images it is difficult to distinguish whether lymphadenopathy was due to nodal disease versus acute inflammation. 03/07/2021 MRI pelvis cT3 N2. Due to the possible contained perforation on previous CT, possible cT4 disease.  He is on TNT neoadjuvant protocol, concurrent Xeloda and radiation. Labs are reviewed and discussed with [name]. Continue Xeloda 1500 mg twice daily on radiation days.  #Chemotherapy-induced neutropenia, ANC is stable 1.5.  # Diarrhea, likely due to chemotherapy negative C diff. Continue as needed Imodium as instructed.  #Spiculated lung nodule, likely primary lung cancer. Discussed with IR, CT guided biopsy is difficult given the position and small size. Will monitor for now.  I will repeat CT chest without contrast after he finishes current treatments.  #Iron deficiency anemia, chemotherapy/radiation induced anemia Status post IV Venofer treatments, off oral iron supplementation due  to constipation.  Hemoglobin slightly decreased.  Monitor.  #Rectal pain, dose reduce to 97mcg fentanyl patch patch.  Refilled  He declined influenza vaccination   Follow-up on 08/23/21 lab MD All questions were answered. The [name] knows to call the clinic with any problems questions or concerns.    Earlie Server, MD, PhD 08/14/2021

## 2021-08-15 ENCOUNTER — Ambulatory Visit
Admission: RE | Admit: 2021-08-15 | Discharge: 2021-08-15 | Disposition: A | Discharge status: Home / Self Care | Payer: Self-pay | Source: Ambulatory Visit | Attending: Radiation Oncology | Admitting: Radiation Oncology

## 2021-08-15 ENCOUNTER — Encounter: Payer: Self-pay | Admitting: Oncology

## 2021-08-15 ENCOUNTER — Other Ambulatory Visit: Payer: Self-pay

## 2021-08-16 ENCOUNTER — Ambulatory Visit
Admission: RE | Admit: 2021-08-16 | Discharge: 2021-08-16 | Disposition: A | Discharge status: Home / Self Care | Payer: Self-pay | Source: Ambulatory Visit | Attending: Radiation Oncology | Admitting: Radiation Oncology

## 2021-08-16 ENCOUNTER — Other Ambulatory Visit: Payer: Self-pay

## 2021-08-19 ENCOUNTER — Other Ambulatory Visit: Payer: Self-pay

## 2021-08-19 ENCOUNTER — Ambulatory Visit
Admission: RE | Admit: 2021-08-19 | Discharge: 2021-08-19 | Disposition: A | Discharge status: Home / Self Care | Payer: Self-pay | Source: Ambulatory Visit | Attending: Radiation Oncology | Admitting: Radiation Oncology

## 2021-08-20 ENCOUNTER — Ambulatory Visit
Admission: RE | Admit: 2021-08-20 | Discharge: 2021-08-20 | Disposition: A | Discharge status: Home / Self Care | Payer: Self-pay | Source: Ambulatory Visit | Attending: Radiation Oncology | Admitting: Radiation Oncology

## 2021-08-21 ENCOUNTER — Ambulatory Visit
Admission: RE | Admit: 2021-08-21 | Discharge: 2021-08-21 | Disposition: A | Discharge status: Home / Self Care | Payer: Self-pay | Source: Ambulatory Visit | Attending: Radiation Oncology | Admitting: Radiation Oncology

## 2021-08-22 ENCOUNTER — Ambulatory Visit
Admission: RE | Admit: 2021-08-22 | Discharge: 2021-08-22 | Disposition: A | Discharge status: Home / Self Care | Payer: Self-pay | Source: Ambulatory Visit | Attending: Radiation Oncology | Admitting: Radiation Oncology

## 2021-08-22 ENCOUNTER — Ambulatory Visit: Payer: Self-pay

## 2021-08-22 ENCOUNTER — Ambulatory Visit: Admission: RE | Admit: 2021-08-22 | Payer: Self-pay | Source: Ambulatory Visit

## 2021-08-23 ENCOUNTER — Ambulatory Visit: Payer: Self-pay

## 2021-08-23 ENCOUNTER — Ambulatory Visit
Admission: RE | Admit: 2021-08-23 | Discharge: 2021-08-23 | Disposition: A | Discharge status: Home / Self Care | Payer: Self-pay | Source: Ambulatory Visit | Attending: Radiation Oncology | Admitting: Radiation Oncology

## 2021-08-23 ENCOUNTER — Other Ambulatory Visit (HOSPITAL_COMMUNITY): Payer: Self-pay

## 2021-08-23 ENCOUNTER — Encounter: Payer: Self-pay | Admitting: Oncology

## 2021-08-23 ENCOUNTER — Inpatient Hospital Stay (HOSPITAL_BASED_OUTPATIENT_CLINIC_OR_DEPARTMENT_OTHER): Payer: Self-pay | Admitting: Oncology

## 2021-08-23 ENCOUNTER — Other Ambulatory Visit: Payer: Self-pay

## 2021-08-23 ENCOUNTER — Other Ambulatory Visit: Payer: Self-pay | Admitting: Pharmacist

## 2021-08-23 ENCOUNTER — Inpatient Hospital Stay: Payer: Self-pay

## 2021-08-23 VITALS — BP 124/93 | HR 98 | Temp 96.9°F | Resp 18 | Wt 156.0 lb

## 2021-08-23 DIAGNOSIS — C20 Malignant neoplasm of rectum: Secondary | ICD-10-CM

## 2021-08-23 DIAGNOSIS — T451X5A Adverse effect of antineoplastic and immunosuppressive drugs, initial encounter: Secondary | ICD-10-CM

## 2021-08-23 DIAGNOSIS — Z95828 Presence of other vascular implants and grafts: Secondary | ICD-10-CM

## 2021-08-23 DIAGNOSIS — R911 Solitary pulmonary nodule: Secondary | ICD-10-CM

## 2021-08-23 DIAGNOSIS — Z5111 Encounter for antineoplastic chemotherapy: Secondary | ICD-10-CM

## 2021-08-23 DIAGNOSIS — D701 Agranulocytosis secondary to cancer chemotherapy: Secondary | ICD-10-CM

## 2021-08-23 DIAGNOSIS — G893 Neoplasm related pain (acute) (chronic): Secondary | ICD-10-CM

## 2021-08-23 LAB — COMPREHENSIVE METABOLIC PANEL
ALT: 22 U/L (ref 0–44)
AST: 25 U/L (ref 15–41)
Albumin: 3.8 g/dL (ref 3.5–5.0)
Alkaline Phosphatase: 94 U/L (ref 38–126)
Anion gap: 9 (ref 5–15)
BUN: 7 mg/dL (ref 6–20)
CO2: 28 mmol/L (ref 22–32)
Calcium: 9.2 mg/dL (ref 8.9–10.3)
Chloride: 97 mmol/L — ABNORMAL LOW (ref 98–111)
Creatinine, Ser: 0.82 mg/dL (ref 0.61–1.24)
GFR, Estimated: >60 mL/min (ref >60)
Glucose, Bld: 107 mg/dL — ABNORMAL HIGH (ref 70–99)
Potassium: 3.7 mmol/L (ref 3.5–5.1)
Sodium: 134 mmol/L — ABNORMAL LOW (ref 135–145)
Total Bilirubin: 0.3 mg/dL (ref 0.3–1.2)
Total Protein: 8.1 g/dL (ref 6.5–8.1)

## 2021-08-23 LAB — CBC WITH DIFFERENTIAL/PLATELET
Abs Immature Granulocytes: 0.01 10*3/uL (ref 0.00–0.07)
Basophils Absolute: 0 10*3/uL (ref 0.0–0.1)
Basophils Relative: 0 %
Eosinophils Absolute: 0.3 10*3/uL (ref 0.0–0.5)
Eosinophils Relative: 9 %
HCT: 35.7 % — ABNORMAL LOW (ref 39.0–52.0)
Hemoglobin: 12.3 g/dL — ABNORMAL LOW (ref 13.0–17.0)
Immature Granulocytes: 0 %
Lymphocytes Relative: 21 %
Lymphs Abs: 0.6 10*3/uL — ABNORMAL LOW (ref 0.7–4.0)
MCH: 32 pg (ref 26.0–34.0)
MCHC: 34.5 g/dL (ref 30.0–36.0)
MCV: 93 fL (ref 80.0–100.0)
Monocytes Absolute: 0.7 10*3/uL (ref 0.1–1.0)
Monocytes Relative: 25 %
Neutro Abs: 1.3 10*3/uL — ABNORMAL LOW (ref 1.7–7.7)
Neutrophils Relative %: 45 %
Platelets: 272 10*3/uL (ref 150–400)
RBC: 3.84 MIL/uL — ABNORMAL LOW (ref 4.22–5.81)
RDW: 18.7 % — ABNORMAL HIGH (ref 11.5–15.5)
WBC: 2.9 10*3/uL — ABNORMAL LOW (ref 4.0–10.5)
nRBC: 0 % (ref 0.0–0.2)

## 2021-08-23 MED ORDER — LIDOCAINE-PRILOCAINE 2.5-2.5 % EX CREA
TOPICAL_CREAM | CUTANEOUS | 3 refills | Status: DC
  Filled 2021-08-23: qty 30, 15d supply, fill #0

## 2021-08-23 MED ORDER — CAPECITABINE 500 MG PO TABS
825.0000 mg/m2 | ORAL_TABLET | Freq: Two times a day (BID) | ORAL | 0 refills | Status: DC
  Filled 2021-08-23: qty 24, 4d supply, fill #0

## 2021-08-23 MED ORDER — CAPECITABINE 500 MG PO TABS
825.0000 mg/m2 | ORAL_TABLET | Freq: Two times a day (BID) | ORAL | 0 refills | Status: DC
  Filled 2021-08-23: qty 12, 2d supply, fill #0

## 2021-08-23 NOTE — Progress Notes (Signed)
Pt here for follow up. Pt reports mild numbness to fingertips. Lidocaine cream resent to [facility] per pt request

## 2021-08-23 NOTE — Progress Notes (Signed)
Hematology/Oncology follow up note [phone] [phone]   Patient Care Team: Pcp, No as PCP - General [name], RN as Oncology Nurse Navigator  CHIEF COMPLAINTS/REASON FOR VISIT:  Follow up for rectal cancer treatment  HISTORY OF PRESENTING ILLNESS:   Barry Hari. is a  55 y.o.  male with PMH listed below was seen in consultation at the request of  No ref. provider found  for evaluation of rectal cancer  02/05/2021-02/06/2021 patient was hospitalized due to generalized weakness, intermittent lightheadedness, weight loss and worsening constipation.  02/05/2021 CT abdomen showed concerning of severe rectal wall thickening and right internal iliac lymph node concerning for metastatic disease.  Patient was seen by gastroenterology and had colonoscopy which showed a circumferential fungating mass in the rectum.  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  02/14/2021-02/16/2021 hospitalized due to rectal bleeding and pain.   02/14/2021 CT showed perirectal fluid collection/gas concerning for infection with significant leukocytosis, anemia with hemoglobin of 6.8.  Patient received PRBC transfusion, IV antibiotics.  He underwent an IR guided placement of JP drain into the rectal abscess.  Discharged home with oral Augmentin. 02/20/2021 presented to ER with new skin opening and draining from left gluteus.  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  JP drain has been removed.  Patient was continued on Augmentin.  02/13/2021, PET scan showed locally advanced rectal cancer with billowing of the mesorectum now with low attenuation material that was not present on previous examination with extensive stranding and inflammation.   Bulky RIGHT pelvic sidewall/hypogastric lymph node outside of the mesorectum with stippled calcification measuring 19 mm-no increased metabolic activity. Small LEFT hypogastric lymph node just peripheral to the internal  external bifurcation-SUV 3.2 High RIGHT internal just below or at the internal/common iliac transition, lymph node -SUV 4.8 LEFT high hypogastric lymph node  8 mm-SUV 3. Scattered lymph nodes throughout the retroperitoneum with low FDG uptake Bilateral inguinal lymph nodes largest on the RIGHT (image 248/3) 11 mm with a maximum SUV of 2.8  spiculated nodule in the LEFT upper lobe- 9 x 8 mm  02/14/2021, CT abdomen pelvis without contrast Showed perforated rectal mass with contained perforation with fluid and gas extending above and below the pelvic floor,potentially involving the sphincter complex and extending into LEFT ischial rectal fossa  02/20/2021, CT pelvis with contrast showed Large perirectal/perianal abscess has markedly decreased in size since placement of the percutaneous drain. There are residual gas-filled collections in the soft tissues and suspect there is a fistula or sinus tract between the rectal mass and the subcutaneous tissues. Soft tissue gas along the medial left buttock and concern for a cutaneous ulceration in this area. Large rectal mass with evidence for a large necrotic right pelvic lymph node.   02/27/2021 medi port placed by Dr.Dew 03/13/2021 reports right butt cheek and also left perianal area fullness.he was seen by Dr.Pabon urgently  and had right buttock abscess drained. Left perianal fullness was felt to be due to cancer.   03/13/2021 started [place]  # He was seen by [name] Dr.Mantyh who agrees with TNT followed by surgery. # Emphysema, recommend him to establish care with PCP. He was provided with a list of PCP office contact information.   INTERVAL HISTORY Barry Rusher. is a 55 y.o. male who has above history reviewed by me today presents for follow up visit for management of rectal cancer. Patient is currently on concurrent Xeloda with radiation.   So far tolerates well.  Patient reports some soreness of the rectal area.  No nausea vomiting diarrhea.  No  blood in the stool. Patient is on fentanyl patch.    Review of Systems  Constitutional:  Negative for appetite change, chills, fatigue, fever and unexpected weight change.  HENT:   Negative for hearing loss and voice change.   Eyes:  Negative for eye problems and icterus.  Respiratory:  Negative for chest tightness, cough and shortness of breath.   Cardiovascular:  Negative for chest pain and leg swelling.  Gastrointestinal:  Positive for rectal pain. Negative for abdominal distention, abdominal pain, blood in stool, constipation and diarrhea.  Endocrine: Negative for hot flashes.  Genitourinary:  Negative for difficulty urinating, dysuria and frequency.   Musculoskeletal:  Negative for arthralgias.  Skin:  Negative for itching and rash.  Neurological:  Negative for light-headedness and numbness.  Hematological:  Negative for adenopathy. Does not bruise/bleed easily.  Psychiatric/Behavioral:  Negative for confusion.    MEDICAL HISTORY:  Past Medical History:  Diagnosis Date   Headache    Left shoulder pain    Rectal cancer ([place])     SURGICAL HISTORY: Past Surgical History:  Procedure Laterality Date   COLONOSCOPY WITH PROPOFOL N/A 02/06/2021   Procedure: COLONOSCOPY WITH PROPOFOL;  Surgeon: Lin Landsman, MD;  Location: [facility] ENDOSCOPY;  Service: Gastroenterology;  Laterality: N/A;   PORTA CATH INSERTION N/A 02/27/2021   Procedure: PORTA CATH INSERTION;  Surgeon: Algernon Huxley, MD;  Location: [facility];  Service: Cardiovascular;  Laterality: N/A;   ROTATOR CUFF REPAIR Left     SOCIAL HISTORY: Social History   Socioeconomic History   Marital status: Married    Spouse name: Not on file   Number of children: Not on file   Years of education: Not on file   Highest education level: Not on file  Occupational History   Not on file  Tobacco Use   Smoking status: Former    Packs/day: 1.00    Years: 10.00    Pack years: 10.00    Types: Cigars, Cigarettes     Quit date: 02/04/2021    Years since quitting: 0.5   Smokeless tobacco: Never  Substance and Sexual Activity   Alcohol use: Not Currently   Drug use: No   Sexual activity: Not on file  Other Topics Concern   Not on file  Social History Narrative   Not on file   Social Determinants of Health   Financial Resource Strain: Not on file  Food Insecurity: Not on file  Transportation Needs: Not on file  Physical Activity: Not on file  Stress: Not on file  Social Connections: Not on file  Intimate Partner Violence: Not on file    FAMILY HISTORY: Family History  Problem Relation Age of Onset   Cancer Sister    Diabetes Mother    Cancer Maternal Grandmother    Cancer Paternal Grandmother     ALLERGIES:  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  MEDICATIONS:  Current Outpatient Medications  Medication Sig Dispense Refill   feeding supplement (ENSURE ENLIVE / ENSURE PLUS) LIQD Take 237 mLs by mouth daily. 237 mL 12   fentaNYL (DURAGESIC) 25 MCG/HR Place 1 patch onto the skin every 3 (three) days. 10 patch 0   nicotine (NICODERM CQ - DOSED IN MG/24 HOURS) 21 mg/24hr patch Place 1 patch (21 mg total) onto the skin daily. 28 patch 0   ondansetron (ZOFRAN) 8 MG tablet Take 1 tablet (8 mg total) by  mouth 2 (two) times daily as needed for refractory nausea / vomiting. Start on day 3 after chemotherapy. 30 tablet 1   prochlorperazine (COMPAZINE) 10 MG tablet Take 1 tablet (10 mg total) by mouth every 6 (six) hours as needed (Nausea or vomiting). 30 tablet 1   senna-docusate (SENOKOT-S) 8.6-50 MG tablet Take 2 tablets by mouth daily. 120 tablet 0   capecitabine (XELODA) 500 MG tablet Take 3 tablets (1,500 mg total) by mouth 2 (two) times daily after a meal. Take Monday-Friday. Take only on days of radiation. 12 tablet 0   lidocaine-prilocaine (EMLA) cream Apply to affected area once (Patient not taking: Reported on 08/23/2021) 30 g 3   loperamide (IMODIUM) 2 MG capsule Take 1 capsule (2 mg total) by  mouth See admin instructions. Take 2 tablets with onset of diarrhea, then 1 tab after every loose bowel movement. Maximum dose 8 tablets within 24 hours. (Patient not taking: Reported on 08/23/2021) 90 capsule 0   No current facility-administered medications for this visit.     PHYSICAL EXAMINATION: ECOG PERFORMANCE STATUS: 1 - Symptomatic but completely ambulatory Vitals:   08/23/21 0927  BP: (!) 124/93  Pulse: 98  Resp: 18  Temp: (!) 96.9 F (36.1 C)     Filed Weights   08/23/21 0927  Weight: 156 lb (70.8 kg)     Physical Exam Constitutional:      Comments: he ambulates independently.  HENT:     Head: Normocephalic and atraumatic.  Eyes:     General: No scleral icterus. Cardiovascular:     Rate and Rhythm: Normal rate and regular rhythm.     Heart sounds: Normal heart sounds.  Pulmonary:     Effort: Pulmonary effort is normal. No respiratory distress.     Breath sounds: No wheezing.     Comments: Decreased breath sound bilaterally.  Abdominal:     General: Bowel sounds are normal. There is no distension.     Palpations: Abdomen is soft.  Musculoskeletal:        General: No deformity. Normal range of motion.     Cervical back: Normal range of motion and neck supple.  Skin:    General: Skin is warm and dry.     Findings: No erythema or rash.  Neurological:     Mental Status: He is alert and oriented to person, place, and time. Mental status is at baseline.     Cranial Nerves: No cranial nerve deficit.     Coordination: Coordination normal.  Psychiatric:        Mood and Affect: Mood normal.    LABORATORY DATA:  I have reviewed the data as listed Lab Results  Component Value Date   WBC 2.9 (L) 08/23/2021   HGB 12.3 (L) 08/23/2021   HCT 35.7 (L) 08/23/2021   MCV 93.0 08/23/2021   PLT 272 08/23/2021   Recent Labs    07/29/21 0929 08/14/21 0918 08/23/21 0911  NA 135 138 134*  K 4.6 4.0 3.7  CL 100 101 97*  CO2 28 29 28   GLUCOSE 97 108* 107*  BUN  10 10 7   CREATININE 1.03 0.85 0.82  CALCIUM 8.9 9.4 9.2  GFRNONAA >60 >60 >60  PROT 8.2* 7.9 8.1  ALBUMIN 3.5 3.6 3.8  AST 31 24 25   ALT 32 21 22  ALKPHOS 97 94 94  BILITOT 0.4 0.3 0.3    Iron/TIBC/Ferritin/ %Sat    Component Value Date/Time   IRON 28 (L) 03/25/2021 1029   TIBC  356 03/25/2021 1029   FERRITIN 118 03/25/2021 1029   IRONPCTSAT 8 (L) 03/25/2021 1029      RADIOGRAPHIC STUDIES: I have personally reviewed the radiological images as listed and agreed with the findings in the report. No results found.      ASSESSMENT & PLAN:  1. Encounter for antineoplastic chemotherapy   2. Neoplasm related pain   3. Rectal cancer (HCC)   4. Lung nodule   5. Chemotherapy induced neutropenia (HCC)   6. Port-A-Cath in place    Cancer Staging  Rectal cancer [facility]) Staging form: Colon and Rectum, AJCC 8th Edition - Clinical stage from 02/08/2021: Stage IIIB (cT4a, cN1, cM0) - Signed by Barry Server, MD on 03/06/2021   #locally advanced rectal cancer. His case is complicated with a perirectal abscess. Prior to onset of perirectal abscess, on his 02/05/2021 scan, he was noted to have right internal iliac lymph node 3 x 1 x 2.3 cm which is concerning for nodal disease.On Subsequent images it is difficult to distinguish whether lymphadenopathy was due to nodal disease versus acute inflammation. 03/07/2021 MRI pelvis cT3 N2. Due to the possible contained perforation on previous CT, possible cT4 disease. He is on TNT neoadjuvant protocol, concurrent Xeloda and radiation. He is going to finish radiation on 08/27/2021. Labs are reviewed and are discussed with patient.  Continue Xeloda 1500 mg twice daily on radiation days. He needs 2 more days of Xeloda supply and I communicated with pharmacist who will bring him supplies next week.  #Chemotherapy-induced neutropenia, ANC is stable 1.3.  Monitor.  #Spiculated lung nodule, likely primary lung cancer. Discussed with IR, CT guided biopsy is  difficult given the position and small size.  Repeat CT chest is scheduled.   #Iron deficiency anemia, chemotherapy/radiation induced anemia Status post IV Venofer treatments, off oral iron supplementation due to constipation.  Hemoglobin stable.  #Rectal pain, continue 21mcg fentanyl patch patch.  Refilled #Port-A-Cath in place, patient will get a port flush the next week.  Recommend Port- flush every 8 weeks. He declined influenza vaccination   Follow-up on 08/23/21 lab MD All questions were answered. The patient knows to call the clinic with any problems questions or concerns.    Barry Server, MD, PhD 08/23/2021

## 2021-08-26 ENCOUNTER — Other Ambulatory Visit (HOSPITAL_COMMUNITY): Payer: Self-pay

## 2021-08-26 ENCOUNTER — Ambulatory Visit
Admission: RE | Admit: 2021-08-26 | Discharge: 2021-08-26 | Disposition: A | Discharge status: Home / Self Care | Payer: Self-pay | Source: Ambulatory Visit | Attending: Radiation Oncology | Admitting: Radiation Oncology

## 2021-08-26 ENCOUNTER — Encounter: Payer: Self-pay | Admitting: Pharmacist

## 2021-08-26 ENCOUNTER — Ambulatory Visit: Payer: Self-pay

## 2021-08-26 NOTE — Progress Notes (Signed)
Oral Chemotherapy Pharmacist Encounter   Patient reported being 2 days short on his capecitabine at his office appt last week. Capecitabine refill from [place] was picked up and delivered to radiation oncology for Mr. Kennebrew this morning.   Darl Pikes, PharmD, BCPS, BCOP, CPP Hematology/Oncology Clinical Pharmacist [place]/DB/AP [facility] [phone]  08/26/2021 9:22 AM

## 2021-08-27 ENCOUNTER — Other Ambulatory Visit: Payer: Self-pay

## 2021-08-27 ENCOUNTER — Inpatient Hospital Stay: Payer: Self-pay

## 2021-08-27 ENCOUNTER — Encounter: Payer: Self-pay | Admitting: Oncology

## 2021-08-27 ENCOUNTER — Ambulatory Visit: Payer: Self-pay

## 2021-08-27 ENCOUNTER — Ambulatory Visit
Admission: RE | Admit: 2021-08-27 | Discharge: 2021-08-27 | Disposition: A | Discharge status: Home / Self Care | Payer: Self-pay | Source: Ambulatory Visit | Attending: Radiation Oncology | Admitting: Radiation Oncology

## 2021-08-28 ENCOUNTER — Ambulatory Visit: Payer: Self-pay

## 2021-08-30 ENCOUNTER — Other Ambulatory Visit: Payer: Self-pay

## 2021-08-30 ENCOUNTER — Ambulatory Visit: Payer: Self-pay | Admitting: Oncology

## 2021-09-03 ENCOUNTER — Inpatient Hospital Stay: Payer: Self-pay

## 2021-09-03 ENCOUNTER — Other Ambulatory Visit: Payer: Self-pay

## 2021-09-03 DIAGNOSIS — C20 Malignant neoplasm of rectum: Secondary | ICD-10-CM

## 2021-09-03 DIAGNOSIS — Z95828 Presence of other vascular implants and grafts: Secondary | ICD-10-CM

## 2021-09-03 MED ORDER — SODIUM CHLORIDE 0.9% FLUSH
10.0000 mL | Freq: Once | INTRAVENOUS | Status: AC
  Administered 2021-09-03: 10:00:00 10 mL via INTRAVENOUS
  Filled 2021-09-03: qty 10

## 2021-09-03 MED ORDER — HEPARIN SOD (PORK) LOCK FLUSH 100 UNIT/ML IV SOLN
500.0000 [IU] | Freq: Once | INTRAVENOUS | Status: AC
  Administered 2021-09-03: 10:00:00 500 [IU] via INTRAVENOUS
  Filled 2021-09-03: qty 5

## 2021-09-04 ENCOUNTER — Ambulatory Visit
Admission: RE | Admit: 2021-09-04 | Discharge: 2021-09-04 | Disposition: A | Discharge status: Home / Self Care | Payer: Self-pay | Source: Ambulatory Visit | Attending: Oncology | Admitting: Oncology

## 2021-09-04 DIAGNOSIS — R911 Solitary pulmonary nodule: Secondary | ICD-10-CM | POA: Insufficient documentation

## 2021-09-19 ENCOUNTER — Other Ambulatory Visit: Payer: Self-pay

## 2021-09-19 ENCOUNTER — Encounter: Payer: Self-pay | Admitting: Oncology

## 2021-09-19 MED ORDER — FENTANYL 25 MCG/HR TD PT72
1.0000 | MEDICATED_PATCH | TRANSDERMAL | 0 refills | Status: DC
  Filled 2021-09-19 – 2021-09-20 (×2): qty 10, 30d supply, fill #0

## 2021-09-19 NOTE — Telephone Encounter (Signed)
Patient requesting refill on Fentanyl pathches. To be sent to [facility] employee pharmacy

## 2021-09-20 ENCOUNTER — Other Ambulatory Visit: Payer: Self-pay

## 2021-09-20 ENCOUNTER — Encounter: Payer: Self-pay | Admitting: Oncology

## 2021-09-23 ENCOUNTER — Other Ambulatory Visit: Payer: Self-pay

## 2021-10-04 ENCOUNTER — Telehealth: Payer: Self-pay

## 2021-10-04 NOTE — Telephone Encounter (Signed)
Dr. Hester Mates is planning APR/VY flap with plastics in ~4 weeks. Will arrange a 4 week post surgery with Dr. Tasia Catchings once surgery is scheduled.

## 2021-10-07 ENCOUNTER — Ambulatory Visit
Admission: RE | Admit: 2021-10-07 | Discharge: 2021-10-07 | Disposition: A | Discharge status: Home / Self Care | Payer: Medicaid Other | Source: Ambulatory Visit | Attending: Radiation Oncology | Admitting: Radiation Oncology

## 2021-10-07 ENCOUNTER — Encounter: Payer: Self-pay | Admitting: Oncology

## 2021-10-07 ENCOUNTER — Other Ambulatory Visit: Payer: Self-pay

## 2021-10-07 VITALS — BP 123/82 | HR 112 | Temp 96.8°F | Resp 16 | Wt 152.9 lb

## 2021-10-07 DIAGNOSIS — C20 Malignant neoplasm of rectum: Secondary | ICD-10-CM | POA: Insufficient documentation

## 2021-10-07 DIAGNOSIS — G629 Polyneuropathy, unspecified: Secondary | ICD-10-CM | POA: Insufficient documentation

## 2021-10-07 DIAGNOSIS — Z9221 Personal history of antineoplastic chemotherapy: Secondary | ICD-10-CM | POA: Insufficient documentation

## 2021-10-07 DIAGNOSIS — Z923 Personal history of irradiation: Secondary | ICD-10-CM | POA: Insufficient documentation

## 2021-10-07 NOTE — Progress Notes (Signed)
Radiation Oncology Follow up Note  Name: Barry [name].   Date:   10/07/2021 MRN:  [id number] DOB: Jan 08, 1966    This 56 y.o. 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  REFERRING PROVIDER: No ref. provider found  HPI: Patient is a 56 year old male now out 1 month having completed concurrent chemoradiation therapy for stage IIIb adenocarcinoma the rectum.  He completed FOLFOX chemotherapy.  He is seen today in routine follow-up is doing well he states he is having very little abdominal complaints some intermittent diarrhea no significant increased lower urinary tract symptoms.  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  He is already contacted his surgeon is scheduled for his surgery I believe in early March.  COMPLICATIONS OF TREATMENT: none  FOLLOW UP COMPLIANCE: keeps appointments   PHYSICAL EXAM:  BP 123/82 (BP Location: Left Arm, Patient Position: Sitting)    Pulse (!) 112    Temp (!) 96.8 F (36 C) (Tympanic)    Resp 16    Wt 152 lb 14.4 oz (69.4 kg)    BMI 23.25 kg/m  Well-developed well-nourished patient in NAD. HEENT reveals PERLA, EOMI, discs not visualized.  Oral cavity is clear. No oral mucosal lesions are identified. Neck is clear without evidence of cervical or supraclavicular adenopathy. Lungs are clear to A&P. Cardiac examination is essentially unremarkable with regular rate and rhythm without murmur rub or thrill. Abdomen is benign with no organomegaly or masses noted. Motor sensory and DTR levels are equal and symmetric in the upper and lower extremities. Cranial nerves II through XII are grossly intact. Proprioception is intact. No peripheral adenopathy or edema is identified. No motor or sensory levels are noted. Crude visual fields are within normal range.  RADIOLOGY RESULTS: No current films for review  PLAN: Present time  patient is doing well clinically stable.  Anticipate surgery in the beginning of March I will see him in follow-up in about 3 months.  Also setting up a follow-up in the next couple of weeks with Dr. Tasia Catchings.  Patient is to call with any concerns.  I would like to take this opportunity to thank you for allowing me to participate in the care of your patient.Noreene Filbert, MD

## 2021-10-14 ENCOUNTER — Encounter: Payer: Self-pay | Admitting: Oncology

## 2021-10-14 ENCOUNTER — Telehealth: Payer: Self-pay

## 2021-10-14 NOTE — Telephone Encounter (Signed)
Surgery scheduled for 11/18/21. Appointment request sent to scheduling to arrange an appointment with Dr. Tasia Catchings 4 weeks following surgery.

## 2021-10-29 ENCOUNTER — Other Ambulatory Visit: Payer: Self-pay

## 2021-10-29 ENCOUNTER — Inpatient Hospital Stay: Payer: Medicaid Other | Attending: Oncology

## 2021-10-29 DIAGNOSIS — Z95828 Presence of other vascular implants and grafts: Secondary | ICD-10-CM

## 2021-10-29 DIAGNOSIS — Z452 Encounter for adjustment and management of vascular access device: Secondary | ICD-10-CM | POA: Insufficient documentation

## 2021-10-29 DIAGNOSIS — C20 Malignant neoplasm of rectum: Secondary | ICD-10-CM | POA: Insufficient documentation

## 2021-10-29 MED ORDER — HEPARIN SOD (PORK) LOCK FLUSH 100 UNIT/ML IV SOLN
500.0000 [IU] | Freq: Once | INTRAVENOUS | Status: AC
  Administered 2021-10-29: 500 [IU] via INTRAVENOUS
  Filled 2021-10-29: qty 5

## 2021-10-29 MED ORDER — SODIUM CHLORIDE 0.9% FLUSH
10.0000 mL | Freq: Once | INTRAVENOUS | Status: AC
  Administered 2021-10-29: 10 mL via INTRAVENOUS
  Filled 2021-10-29: qty 10

## 2021-11-01 ENCOUNTER — Other Ambulatory Visit: Payer: Self-pay

## 2021-11-27 ENCOUNTER — Encounter: Payer: Self-pay | Admitting: Oncology

## 2021-12-05 ENCOUNTER — Encounter: Payer: Self-pay | Admitting: Oncology

## 2021-12-05 NOTE — Telephone Encounter (Signed)
Please advise 

## 2021-12-08 ENCOUNTER — Other Ambulatory Visit: Payer: Self-pay

## 2021-12-08 ENCOUNTER — Emergency Department: Payer: Medicaid Other

## 2021-12-08 ENCOUNTER — Emergency Department
Admission: EM | Admit: 2021-12-08 | Discharge: 2021-12-08 | Disposition: A | Discharge status: Other Acute Inpatient Hospital | Payer: Medicaid Other | Attending: Emergency Medicine | Admitting: Emergency Medicine

## 2021-12-08 DIAGNOSIS — L0291 Cutaneous abscess, unspecified: Secondary | ICD-10-CM

## 2021-12-08 DIAGNOSIS — K610 Anal abscess: Secondary | ICD-10-CM | POA: Insufficient documentation

## 2021-12-08 DIAGNOSIS — Z20822 Contact with and (suspected) exposure to covid-19: Secondary | ICD-10-CM | POA: Insufficient documentation

## 2021-12-08 DIAGNOSIS — Z85048 Personal history of other malignant neoplasm of rectum, rectosigmoid junction, and anus: Secondary | ICD-10-CM | POA: Insufficient documentation

## 2021-12-08 DIAGNOSIS — A419 Sepsis, unspecified organism: Secondary | ICD-10-CM | POA: Insufficient documentation

## 2021-12-08 LAB — COMPREHENSIVE METABOLIC PANEL
ALT: 12 U/L (ref 0–44)
AST: 14 U/L — ABNORMAL LOW (ref 15–41)
Albumin: 2.9 g/dL — ABNORMAL LOW (ref 3.5–5.0)
Alkaline Phosphatase: 97 U/L (ref 38–126)
Anion gap: 11 (ref 5–15)
BUN: 8 mg/dL (ref 6–20)
CO2: 28 mmol/L (ref 22–32)
Calcium: 8.6 mg/dL — ABNORMAL LOW (ref 8.9–10.3)
Chloride: 99 mmol/L (ref 98–111)
Creatinine, Ser: 0.84 mg/dL (ref 0.61–1.24)
GFR, Estimated: >60 mL/min (ref >60)
Glucose, Bld: 111 mg/dL — ABNORMAL HIGH (ref 70–99)
Potassium: 3.4 mmol/L — ABNORMAL LOW (ref 3.5–5.1)
Sodium: 138 mmol/L (ref 135–145)
Total Bilirubin: 0.6 mg/dL (ref 0.3–1.2)
Total Protein: 8.6 g/dL — ABNORMAL HIGH (ref 6.5–8.1)

## 2021-12-08 LAB — CBC WITH DIFFERENTIAL/PLATELET
Abs Immature Granulocytes: 0.12 10*3/uL — ABNORMAL HIGH (ref 0.00–0.07)
Basophils Absolute: 0 10*3/uL (ref 0.0–0.1)
Basophils Relative: 0 %
Eosinophils Absolute: 0.1 10*3/uL (ref 0.0–0.5)
Eosinophils Relative: 1 %
HCT: 30.4 % — ABNORMAL LOW (ref 39.0–52.0)
Hemoglobin: 10.2 g/dL — ABNORMAL LOW (ref 13.0–17.0)
Immature Granulocytes: 1 %
Lymphocytes Relative: 4 %
Lymphs Abs: 0.8 10*3/uL (ref 0.7–4.0)
MCH: 29.1 pg (ref 26.0–34.0)
MCHC: 33.6 g/dL (ref 30.0–36.0)
MCV: 86.9 fL (ref 80.0–100.0)
Monocytes Absolute: 1.6 10*3/uL — ABNORMAL HIGH (ref 0.1–1.0)
Monocytes Relative: 9 %
Neutro Abs: 16.2 10*3/uL — ABNORMAL HIGH (ref 1.7–7.7)
Neutrophils Relative %: 85 %
Platelets: 562 10*3/uL — ABNORMAL HIGH (ref 150–400)
RBC: 3.5 MIL/uL — ABNORMAL LOW (ref 4.22–5.81)
RDW: 14.8 % (ref 11.5–15.5)
WBC: 18.9 10*3/uL — ABNORMAL HIGH (ref 4.0–10.5)
nRBC: 0 % (ref 0.0–0.2)

## 2021-12-08 LAB — LACTIC ACID, PLASMA: Lactic Acid, Venous: 0.8 mmol/L (ref 0.5–1.9)

## 2021-12-08 LAB — PROTIME-INR
INR: 1.1 (ref 0.8–1.2)
Prothrombin Time: 14 seconds (ref 11.4–15.2)

## 2021-12-08 LAB — TYPE AND SCREEN
ABO/RH(D): O POS
Antibody Screen: NEGATIVE

## 2021-12-08 LAB — LIPASE, BLOOD: Lipase: 21 U/L (ref 11–51)

## 2021-12-08 LAB — RESP PANEL BY RT-PCR (FLU A&B, COVID) ARPGX2
Influenza A by PCR: NEGATIVE
Influenza B by PCR: NEGATIVE
SARS Coronavirus 2 by RT PCR: NEGATIVE

## 2021-12-08 MED ORDER — SODIUM CHLORIDE 0.9 % IV SOLN
2.0000 g | Freq: Once | INTRAVENOUS | Status: DC
  Filled 2021-12-08: qty 2

## 2021-12-08 MED ORDER — HYDROMORPHONE HCL 1 MG/ML IJ SOLN
0.5000 mg | Freq: Once | INTRAMUSCULAR | Status: AC
  Administered 2021-12-08: 0.5 mg via INTRAVENOUS
  Filled 2021-12-08: qty 1

## 2021-12-08 MED ORDER — VANCOMYCIN HCL IN DEXTROSE 1-5 GM/200ML-% IV SOLN
1000.0000 mg | Freq: Once | INTRAVENOUS | Status: AC
  Administered 2021-12-08: 1000 mg via INTRAVENOUS
  Filled 2021-12-08: qty 200

## 2021-12-08 MED ORDER — CLINDAMYCIN PHOSPHATE 900 MG/50ML IV SOLN
900.0000 mg | Freq: Once | INTRAVENOUS | Status: AC
  Administered 2021-12-08: 900 mg via INTRAVENOUS
  Filled 2021-12-08: qty 50

## 2021-12-08 MED ORDER — SODIUM CHLORIDE 0.9 % IV BOLUS
500.0000 mL | Freq: Once | INTRAVENOUS | Status: AC
Start: 2021-12-08 — End: 2021-12-08
  Administered 2021-12-08: 500 mL via INTRAVENOUS

## 2021-12-08 MED ORDER — HYDROMORPHONE HCL 1 MG/ML IJ SOLN: 1.0000 mg | Freq: Once | INTRAMUSCULAR | Status: DC

## 2021-12-08 MED ORDER — SODIUM CHLORIDE 0.9 % IV BOLUS
1000.0000 mL | Freq: Once | INTRAVENOUS | Status: AC
  Administered 2021-12-08: 1000 mL via INTRAVENOUS

## 2021-12-08 MED ORDER — IOHEXOL 300 MG/ML  SOLN
100.0000 mL | Freq: Once | INTRAMUSCULAR | Status: AC | PRN
  Administered 2021-12-08: 100 mL via INTRAVENOUS

## 2021-12-08 MED ORDER — METRONIDAZOLE 500 MG/100ML IV SOLN: 500.0000 mg | Freq: Once | INTRAVENOUS | Status: DC

## 2021-12-08 MED ORDER — ONDANSETRON HCL 4 MG/2ML IJ SOLN
4.0000 mg | Freq: Once | INTRAMUSCULAR | Status: AC
  Administered 2021-12-08: 4 mg via INTRAVENOUS
  Filled 2021-12-08: qty 2

## 2021-12-08 MED ORDER — FENTANYL CITRATE PF 50 MCG/ML IJ SOSY
50.0000 ug | PREFILLED_SYRINGE | Freq: Once | INTRAMUSCULAR | Status: AC
  Administered 2021-12-08: 50 ug via INTRAVENOUS
  Filled 2021-12-08: qty 1

## 2021-12-08 MED ORDER — PIPERACILLIN-TAZOBACTAM 3.375 G IVPB 30 MIN
3.3750 g | Freq: Once | INTRAVENOUS | Status: AC
Start: 2021-12-08 — End: 2021-12-08
  Administered 2021-12-08: 3.375 g via INTRAVENOUS
  Filled 2021-12-08: qty 50

## 2021-12-08 NOTE — ED Notes (Signed)
Pt resting with lights dimmed.

## 2021-12-08 NOTE — ED Notes (Signed)
Materials provided for pt to empty own colostomy bag. Output already recorded. ? ?2 pillows provided for comfort. ?

## 2021-12-08 NOTE — ED Notes (Signed)
Walker Kehr, RN at [facility] gave= reprt. ? ?

## 2021-12-08 NOTE — Progress Notes (Signed)
[facility] following for sepsis protocol. Protocol cancelled w/ d/c and tx of care to [facility]. ?

## 2021-12-08 NOTE — ED Notes (Signed)
BP too low to give dilaudid at this time. Getting 1st 1L and 2nd 1L bolus at this time. ? ?Pt asking for food and drinks. Informed pt per EDP cannot have PO intake at this time. ? ?PT rates pain as 7/10 in L buttock. ?

## 2021-12-08 NOTE — ED Triage Notes (Signed)
Pt to ED for bleeding and drainage from wound on R buttock where pt had surgical incision for rectal cancer and had JP drain placed and removed.  ? ?JP drain was removed last Wednesday. States that noticed more bleeding today. Bleeding does not appear excessive. Wound has sutures in place.  ? ?EDP at bedside examining pt.  ? ?Pt arrived with PTAR, from home. Wife at bedside. ? ?Pt also has L colostomy and port to R chest. ?

## 2021-12-08 NOTE — ED Notes (Signed)
Pt called out for urinal and asking for food. Urinal provided. Informed will find out if can give food. Asking for update on plan of care. Informed there is no update at this time but that WBC was elevated on labwork and that provider will come and talk to pt again when appropriate. ?

## 2021-12-08 NOTE — Progress Notes (Signed)
CODE SEPSIS - PHARMACY COMMUNICATION ? ?**Broad Spectrum Antibiotics should be administered within 1 hour of Sepsis diagnosis** ? ?Time Code Sepsis Called/Page Received: 8403 ? ?Antibiotics Ordered: Zosyn + clindamycin + vancomycin ? ?Time of 1st antibiotic administration: 1307 ? ?Additional action taken by pharmacy: N/A ? ?Barry [name] ?12/08/2021  1:16 PM  ?

## 2021-12-08 NOTE — ED Provider Notes (Addendum)
? ?[facility] ?Provider Note ? ? ? Event Date/Time  ? First MD Initiated Contact with Patient 12/08/21 [phone]   ?  (approximate) ? ? ?History  ? ?Wound Check ? ? ?HPI ? ?Barry [name]. is a 56 y.o. male with history of rectal cancer who comes in with concerns for bleeding. ? ?I reviewed patient's admission summary from 3/13 until 3/16.  Patient had new job within therapy including radiation and underwent a APR with VY flap Grace Bushy and had a JP drain in his right abdomen and in the flap.  Patient reports that he had the drains removed on 3/29 and since then he has had some bleeding.  They report the bleeding has gotten worse where he soaked a pad about twice a day.  He also reports of increasing pain in that area over the past few days after having the drain removed. ? ?On review of CT imaging from June 2022 patient had a large perirectal perianal abscess ? ?  ? ? ?Physical Exam  ? ?Triage Vital Signs: ?ED Triage Vitals [12/08/21 0943]  ?Enc Vitals Group  ?   BP 106/71  ?   Pulse Rate (!) 102  ?   Resp 18  ?   Temp 98.4 ?F (36.9 ?C)  ?   Temp Source Oral  ?   SpO2 94 %  ?   Weight 158 lb (71.7 kg)  ?   Height '5\' 8"'$  (1.727 m)  ?   Head Circumference   ?   Peak Flow   ?   Pain Score 7  ?   Pain Loc   ?   Pain Edu?   ?   Excl. in [place]?   ? ? ?Most recent vital signs: ?Vitals:  ? 12/08/21 0943  ?BP: 106/71  ?Pulse: (!) 102  ?Resp: 18  ?Temp: 98.4 ?F (36.9 ?C)  ?SpO2: 94%  ? ? ? ?General: Awake, no distress.  ?CV:  Good peripheral perfusion.  ?Resp:  Normal effort.  ?Abd:  No distention.  Colostomy bag with brown stool noted ?Other:  Patient has sutures noted on the right buttock with some induration and warmth noted in the area.  At the top of the sutures with gentle pressure little bit of bloody discharge comes out ? ? ?ED Results / Procedures / Treatments  ? ?Labs ?(all labs ordered are listed, but only abnormal results are displayed) ?Labs Reviewed  ?CBC WITH DIFFERENTIAL/PLATELET   ?COMPREHENSIVE METABOLIC PANEL  ?PROTIME-INR  ?LIPASE, BLOOD  ?LACTIC ACID, PLASMA  ?LACTIC ACID, PLASMA  ?TYPE AND SCREEN  ? ? ?RADIOLOGY ?I have reviewed the CT personally patient does have abscess noted ? ? ? ? ?PROCEDURES: ? ?Critical Care performed: Yes, see critical care procedure note(s) ? ?.Critical Care ?Performed by: Vanessa Hildale, MD ?Authorized by: Vanessa Mallory, MD  ? ?Critical care provider statement:  ?  Critical care time (minutes):  30 ?  Critical care was necessary to treat or prevent imminent or life-threatening deterioration of the following conditions:  Sepsis ?  Critical care was time spent personally by me on the following activities:  Development of treatment plan with patient or surrogate, discussions with consultants, evaluation of patient's response to treatment, examination of patient, ordering and review of laboratory studies, ordering and review of radiographic studies, ordering and performing treatments and interventions, pulse oximetry, re-evaluation of patient's condition and review of old charts ?.1-3 Lead EKG Interpretation ?Performed by: Vanessa [place], MD ?Authorized  by: Vanessa [place], MD  ? ?  Interpretation: normal   ?  ECG rate:  90 ?  ECG rate assessment: normal   ?  Rhythm: sinus rhythm   ?  Ectopy: none   ?  Conduction: normal   ? ? ?MEDICATIONS ORDERED IN ED: ?Medications  ?vancomycin (VANCOCIN) IVPB 1000 mg/200 mL premix (has no administration in time range)  ?sodium chloride 0.9 % bolus 1,000 mL (has no administration in time range)  ?HYDROmorphone (DILAUDID) injection 0.5 mg (has no administration in time range)  ?fentaNYL (SUBLIMAZE) injection 50 mcg (50 mcg Intravenous Given 12/08/21 1012)  ?ondansetron (ZOFRAN) injection 4 mg (4 mg Intravenous Given 12/08/21 1012)  ?iohexol (OMNIPAQUE) 300 MG/ML solution 100 mL (100 mLs Intravenous Contrast Given 12/08/21 1124)  ?sodium chloride 0.9 % bolus 1,000 mL (1,000 mLs Intravenous New Bag/Given 12/08/21 1252)   ?piperacillin-tazobactam (ZOSYN) IVPB 3.375 g (3.375 g Intravenous New Bag/Given 12/08/21 1307)  ?clindamycin (CLEOCIN) IVPB 900 mg (900 mg Intravenous New Bag/Given 12/08/21 1309)  ? ? ? ?IMPRESSION / MDM / ASSESSMENT AND PLAN / ED COURSE  ?I reviewed the triage vital signs and the nursing notes. ?             ?               ? ?Differential diagnosis includes, but is not limited to, concern for postoperative bleeding most likely venous in nature but his gauze are soaked.  There is no blood noted in his stool.  However I am concerned about the area around the sutures that feels indurated and warm to touch the patient reports increasing pain given his history of abscess I am concerned that there could be a possible infection starting.  We will get a CT scan to further evaluate give patient some IV fentanyl IV Zofran to help with discomfort ? ?Lactate is normal.  CT scan concerning for elevated white count of 18.9 with a left shift had elevated platelets.  Patient meets sepsis criteria.  Blood cultures, lactate were ordered.  Broad-spectrum antibiotics were ordered to cover for possible necrotizing fasciitis although my suspicion for this is less likely. ? ?CT scan was concerning for abscess.  I was able to call CT given they did not comment on some gas collection but did not feel it looks like necrotizing fasciitis at this time.  Patient is already been covered broadly.  ? ?CMP is reassuring.  INR normal ? ?Discussed with the transfer center at [facility] and patient accepted ED to ED.  Patient was given other 0.5 of Dilaudid and some additional fluids. ? ?Patient blood pressure noted to be slightly low therefore full 30 cc/kg were ordered hold off on Dilaudid until blood pressures come up.  Patient is otherwise mentating well and updated on plan.  ? ? ?The patient is on the cardiac monitor to evaluate for evidence of arrhythmia and/or significant heart rate changes. ? ?FINAL CLINICAL IMPRESSION(S) / ED DIAGNOSES  ? ?Final  diagnoses:  ?Abscess  ?Sepsis, due to unspecified organism, unspecified whether acute organ dysfunction present [facility])  ? ? ? ?Rx / DC Orders  ? ?ED Discharge Orders   ? ? None  ? ?  ? ? ? ?Note:  This document was prepared using Dragon voice recognition software and may include unintentional dictation errors. ?  ?Vanessa [place], MD ?12/08/21 1436 ? ?  ?Vanessa , MD ?12/08/21 1500 ? ?

## 2021-12-08 NOTE — Consult Note (Signed)
PHARMACY -  BRIEF ANTIBIOTIC NOTE  ? ?Pharmacy has received consult(s) for cefepime and vancomycin from an ED provider. Patient is also ordered metronidazole. The patient's profile has been reviewed for ht/wt/allergies/indication/available labs.   ? ?One time order(s) placed for  ?--Vancomycin 1 g IV ?--Cefepime 2 g IV ? ?Further antibiotics/pharmacy consults should be ordered by admitting physician if indicated.       ?                ?Thank you, ?Benita Gutter ?12/08/2021  12:51 PM  ?

## 2021-12-08 NOTE — ED Notes (Signed)
Pt to CT now

## 2021-12-08 NOTE — ED Notes (Signed)
Last BP was 101/60, per EDP ok to give dilaudid now. ? ?

## 2021-12-08 NOTE — ED Notes (Signed)
1 set of cultures drawn with IV stick. ?

## 2021-12-11 LAB — AEROBIC CULTURE W GRAM STAIN (SUPERFICIAL SPECIMEN)

## 2021-12-13 LAB — CULTURE, BLOOD (ROUTINE X 2)
Culture: NO GROWTH
Culture: NO GROWTH
Special Requests: ADEQUATE
Special Requests: ADEQUATE

## 2021-12-16 ENCOUNTER — Other Ambulatory Visit: Payer: Self-pay

## 2021-12-16 ENCOUNTER — Inpatient Hospital Stay: Payer: Self-pay | Attending: Oncology | Admitting: Oncology

## 2021-12-16 ENCOUNTER — Encounter: Payer: Self-pay | Admitting: Oncology

## 2021-12-16 ENCOUNTER — Inpatient Hospital Stay: Payer: Medicaid Other

## 2021-12-16 VITALS — BP 108/69 | HR 89 | Temp 97.6°F | Resp 16 | Ht 68.0 in | Wt 148.0 lb

## 2021-12-16 DIAGNOSIS — C20 Malignant neoplasm of rectum: Secondary | ICD-10-CM

## 2021-12-16 DIAGNOSIS — Z9221 Personal history of antineoplastic chemotherapy: Secondary | ICD-10-CM | POA: Insufficient documentation

## 2021-12-16 DIAGNOSIS — R634 Abnormal weight loss: Secondary | ICD-10-CM | POA: Insufficient documentation

## 2021-12-16 DIAGNOSIS — G893 Neoplasm related pain (acute) (chronic): Secondary | ICD-10-CM | POA: Insufficient documentation

## 2021-12-16 DIAGNOSIS — R911 Solitary pulmonary nodule: Secondary | ICD-10-CM | POA: Insufficient documentation

## 2021-12-16 DIAGNOSIS — Z95828 Presence of other vascular implants and grafts: Secondary | ICD-10-CM

## 2021-12-16 DIAGNOSIS — Z79891 Long term (current) use of opiate analgesic: Secondary | ICD-10-CM | POA: Insufficient documentation

## 2021-12-16 DIAGNOSIS — J439 Emphysema, unspecified: Secondary | ICD-10-CM | POA: Insufficient documentation

## 2021-12-16 DIAGNOSIS — Z87891 Personal history of nicotine dependence: Secondary | ICD-10-CM | POA: Insufficient documentation

## 2021-12-16 LAB — CBC WITH DIFFERENTIAL/PLATELET
Abs Immature Granulocytes: 0.1 10*3/uL — ABNORMAL HIGH (ref 0.00–0.07)
Basophils Absolute: 0 10*3/uL (ref 0.0–0.1)
Basophils Relative: 0 %
Eosinophils Absolute: 0.3 10*3/uL (ref 0.0–0.5)
Eosinophils Relative: 4 %
HCT: 29.8 % — ABNORMAL LOW (ref 39.0–52.0)
Hemoglobin: 9.9 g/dL — ABNORMAL LOW (ref 13.0–17.0)
Immature Granulocytes: 2 %
Lymphocytes Relative: 18 %
Lymphs Abs: 1.2 10*3/uL (ref 0.7–4.0)
MCH: 28.9 pg (ref 26.0–34.0)
MCHC: 33.2 g/dL (ref 30.0–36.0)
MCV: 86.9 fL (ref 80.0–100.0)
Monocytes Absolute: 0.7 10*3/uL (ref 0.1–1.0)
Monocytes Relative: 11 %
Neutro Abs: 4.5 10*3/uL (ref 1.7–7.7)
Neutrophils Relative %: 65 %
Platelets: 666 10*3/uL — ABNORMAL HIGH (ref 150–400)
RBC: 3.43 MIL/uL — ABNORMAL LOW (ref 4.22–5.81)
RDW: 16.1 % — ABNORMAL HIGH (ref 11.5–15.5)
WBC: 6.9 10*3/uL (ref 4.0–10.5)
nRBC: 0 % (ref 0.0–0.2)

## 2021-12-16 LAB — RETIC PANEL
Immature Retic Fract: 18 % — ABNORMAL HIGH (ref 2.3–15.9)
RBC.: 3.39 MIL/uL — ABNORMAL LOW (ref 4.22–5.81)
Retic Count, Absolute: 104.4 10*3/uL (ref 19.0–186.0)
Retic Ct Pct: 3.1 % (ref 0.4–3.1)
Reticulocyte Hemoglobin: 30.6 pg (ref >27.9)

## 2021-12-16 LAB — COMPREHENSIVE METABOLIC PANEL
ALT: 17 U/L (ref 0–44)
AST: 18 U/L (ref 15–41)
Albumin: 2.9 g/dL — ABNORMAL LOW (ref 3.5–5.0)
Alkaline Phosphatase: 80 U/L (ref 38–126)
Anion gap: 6 (ref 5–15)
BUN: 6 mg/dL (ref 6–20)
CO2: 27 mmol/L (ref 22–32)
Calcium: 8.6 mg/dL — ABNORMAL LOW (ref 8.9–10.3)
Chloride: 100 mmol/L (ref 98–111)
Creatinine, Ser: 0.87 mg/dL (ref 0.61–1.24)
GFR, Estimated: >60 mL/min (ref >60)
Glucose, Bld: 89 mg/dL (ref 70–99)
Potassium: 4.1 mmol/L (ref 3.5–5.1)
Sodium: 133 mmol/L — ABNORMAL LOW (ref 135–145)
Total Bilirubin: 0.4 mg/dL (ref 0.3–1.2)
Total Protein: 8.6 g/dL — ABNORMAL HIGH (ref 6.5–8.1)

## 2021-12-16 MED ORDER — HEPARIN SOD (PORK) LOCK FLUSH 100 UNIT/ML IV SOLN
500.0000 [IU] | Freq: Once | INTRAVENOUS | Status: AC
  Filled 2021-12-16: qty 5

## 2021-12-16 MED ORDER — HEPARIN SOD (PORK) LOCK FLUSH 100 UNIT/ML IV SOLN
INTRAVENOUS | Status: AC
  Administered 2021-12-16: 500 [IU] via INTRAVENOUS
  Filled 2021-12-16: qty 5

## 2021-12-16 MED ORDER — FENTANYL 25 MCG/HR TD PT72
1.0000 | MEDICATED_PATCH | TRANSDERMAL | 0 refills | Status: DC
  Filled 2021-12-16: qty 10, 30d supply, fill #0

## 2021-12-16 MED ORDER — SODIUM CHLORIDE 0.9% FLUSH
10.0000 mL | INTRAVENOUS | Status: DC | PRN
  Administered 2021-12-16: 10 mL via INTRAVENOUS
  Filled 2021-12-16: qty 10

## 2021-12-16 NOTE — Progress Notes (Addendum)
?Hematology/Oncology follow up note ?Telephone:(336) [phone] [phone] ? ? ?Patient Care Team: ?Pcp, No as PCP - General ?[name], RN as Oncology Nurse Navigator ? ?CHIEF COMPLAINTS/REASON FOR VISIT:  ?Follow up for rectal cancer treatment ? ?HISTORY OF PRESENTING ILLNESS:  ? ?Barry [name]. is a  56 y.o.  male with PMH listed below was seen in consultation at the request of  No ref. provider found  for evaluation of rectal cancer ? ?02/05/2021-02/06/2021 patient was hospitalized due to generalized weakness, intermittent lightheadedness, weight loss and worsening constipation.  02/05/2021 CT abdomen showed concerning of severe rectal wall thickening and right internal iliac lymph node concerning for metastatic disease.  Patient was seen by gastroenterology and had colonoscopy which showed a circumferential fungating mass in the rectum.  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 ? ?02/14/2021-02/16/2021 hospitalized due to rectal bleeding and pain.   ?02/14/2021 CT showed perirectal fluid collection/gas concerning for infection with significant leukocytosis, anemia with hemoglobin of 6.8.  Patient received PRBC transfusion, IV antibiotics.  He underwent an IR guided placement of JP drain into the rectal abscess.  Discharged home with oral Augmentin. ?02/20/2021 presented to ER with new skin opening and draining from left gluteus.  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  JP drain has been removed.  Patient was continued on Augmentin. ? ?02/13/2021, PET scan showed locally advanced rectal cancer with billowing of the mesorectum now with low attenuation material that was not present on previous examination with extensive stranding and inflammation.   ?Bulky RIGHT pelvic sidewall/hypogastric lymph node outside of the mesorectum with stippled calcification measuring 19 mm-no increased metabolic activity. ?Small LEFT hypogastric lymph node just peripheral to the internal  external bifurcation-SUV 3.2 ?High RIGHT internal just below or at the internal/common iliac transition, lymph node -SUV 4.8 ?LEFT high hypogastric lymph node  8 mm-SUV 3. ?Scattered lymph nodes throughout the retroperitoneum with low FDG uptake ?Bilateral inguinal lymph nodes largest on the RIGHT (image 248/3) 11 mm with a maximum SUV of 2.8 ? ?spiculated nodule in the LEFT upper lobe- 9 x 8 mm ? ?02/14/2021, CT abdomen pelvis without contrast Showed perforated rectal mass with contained perforation with fluid and gas extending above and below the pelvic floor,potentially involving the sphincter complex and extending into LEFT ischial rectal fossa ? ?02/20/2021, CT pelvis with contrast showed Large perirectal/perianal abscess has markedly decreased in size ?since placement of the percutaneous drain. There are residual gas-filled collections in the soft tissues and suspect there is a fistula or sinus tract between the rectal mass and the subcutaneous tissues. Soft tissue gas along the medial left buttock and concern for a cutaneous ulceration in this area. Large rectal mass with evidence for a large necrotic right pelvic lymph node. ? ? ?02/27/2021 medi port placed by Dr.Dew ?03/13/2021 reports right butt cheek and also left perianal area fullness.he was seen by Dr.Pabon urgently  and had right buttock abscess drained. Left perianal fullness was felt to be due to cancer.  ? ?03/13/2021 started FOLOFX ? ?# He was seen by [name] Dr.Mantyh who agrees with TNT followed by surgery. ?# Emphysema, recommend him to establish care with PCP. He was provided with a list of PCP office contact information.  ? ?INTERVAL HISTORY ?Barry [name]. is a 56 y.o. male who has above history reviewed by me today presents for follow up visit for management of rectal cancer. ?11/18/2021, patient is status post open APR with flap for rectal cancer. ?Pathology rectal  adenomacarcinoma ypT3N0.  Positive margin: Radial (circumferential) or mesenteric:  adjacent to perforation  ?Patient presented to emergency room on 12/08/21, rectal pain.  CT scan showed 18.3 cm fluid and gas collection in the pelvic surgical bed compatible with infected collection/abscess.  Patient was sent to [facility] and admitted for.  Patient has CT-guided drain placement on 12/09/2021.  Blood culture positive for Staph epidermidis which was felt to be contaminant.  Fluid culture grew pansensitive Staph aureus, strep pyogenes and candida albicans.  Patient was treated with IV antibiotics and transition to p.o. Augmentin and fluconazole on 12/11/2021. ? ?Today patient reports rectal pain.  He takes oxycodone and request fentanyl patch.  He previously tolerated fentanyl patch very well. ?He has lost weight since last visit. ? ?Review of Systems  ?Constitutional:  Negative for appetite change, chills, fatigue, fever and unexpected weight change.  ?HENT:   Negative for hearing loss and voice change.   ?Eyes:  Negative for eye problems and icterus.  ?Respiratory:  Negative for chest tightness, cough and shortness of breath.   ?Cardiovascular:  Negative for chest pain and leg swelling.  ?Gastrointestinal:  Positive for rectal pain. Negative for abdominal distention, abdominal pain, blood in stool, constipation and diarrhea.  ?Endocrine: Negative for hot flashes.  ?Genitourinary:  Negative for difficulty urinating, dysuria and frequency.   ?Musculoskeletal:  Negative for arthralgias.  ?Skin:  Negative for itching and rash.  ?Neurological:  Negative for light-headedness and numbness.  ?Hematological:  Negative for adenopathy. Does not bruise/bleed easily.  ?Psychiatric/Behavioral:  Negative for confusion.   ? ?MEDICAL HISTORY:  ?Past Medical History:  ?Diagnosis Date  ? Headache   ? Left shoulder pain   ? Rectal cancer ([place])   ? ? ?SURGICAL HISTORY: ?Past Surgical History:  ?Procedure Laterality Date  ? COLON SURGERY    ? COLONOSCOPY WITH PROPOFOL N/A 02/06/2021  ? Procedure: COLONOSCOPY WITH PROPOFOL;   Surgeon: Lin Landsman, MD;  Location: [facility] ENDOSCOPY;  Service: Gastroenterology;  Laterality: N/A;  ? PORTA CATH INSERTION N/A 02/27/2021  ? Procedure: PORTA CATH INSERTION;  Surgeon: Algernon Huxley, MD;  Location: [place] CV LAB;  Service: Cardiovascular;  Laterality: N/A;  ? ROTATOR CUFF REPAIR Left   ? ? ?SOCIAL HISTORY: ?Social History  ? ?Socioeconomic History  ? Marital status: Married  ?  Spouse name: Not on file  ? Number of children: Not on file  ? Years of education: Not on file  ? Highest education level: Not on file  ?Occupational History  ? Not on file  ?Tobacco Use  ? Smoking status: Former  ?  Packs/day: 1.00  ?  Years: 10.00  ?  Pack years: 10.00  ?  Types: Cigars, Cigarettes  ?  Quit date: 02/04/2021  ?  Years since quitting: 0.8  ? Smokeless tobacco: Never  ?Substance and Sexual Activity  ? Alcohol use: Not Currently  ? Drug use: No  ? Sexual activity: Not on file  ?Other Topics Concern  ? Not on file  ?Social History Narrative  ? Not on file  ? ?Social Determinants of Health  ? ?Financial Resource Strain: Not on file  ?Food Insecurity: Not on file  ?Transportation Needs: Not on file  ?Physical Activity: Not on file  ?Stress: Not on file  ?Social Connections: Not on file  ?Intimate Partner Violence: Not on file  ? ? ?FAMILY HISTORY: ?Family History  ?Problem Relation Age of Onset  ? Cancer Sister   ? Diabetes Mother   ? Cancer  Maternal Grandmother   ? Cancer Paternal Grandmother   ? ? ?ALLERGIES:  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 ? ?MEDICATIONS:  ?Current Outpatient Medications  ?Medication Sig Dispense Refill  ? amoxicillin-clavulanate (AUGMENTIN) 875-125 MG tablet Take by mouth.    ? fluconazole (DIFLUCAN) 200 MG tablet Take by mouth.    ? nicotine (NICODERM CQ - DOSED IN MG/24 HOURS) 21 mg/24hr patch Place 1 patch (21 mg total) onto the skin daily. 28 patch 0  ? oxyCODONE (OXY IR/ROXICODONE) 5 MG immediate release tablet Take by mouth.    ? senna-docusate (SENOKOT-S) 8.6-50 MG tablet  Take 2 tablets by mouth daily. 120 tablet 0  ? capecitabine (XELODA) 500 MG tablet Take 3 tablets (1,500 mg total) by mouth 2 (two) times daily after a meal. Take Monday-Friday. Take only on days of radiation. (

## 2021-12-17 ENCOUNTER — Encounter: Payer: Self-pay | Admitting: Oncology

## 2021-12-26 ENCOUNTER — Ambulatory Visit: Payer: Medicaid Other

## 2021-12-26 ENCOUNTER — Encounter: Payer: Self-pay | Admitting: Nurse Practitioner

## 2021-12-26 NOTE — Progress Notes (Signed)
Nutrition ? ?Spoke with patient regarding ensure.  RD will leave complimentary case of ensure plus at front desk for patient to pick up.   ? ?Patient s/p surgery on 3/13 then readmitted to the hospital 2 weeks ago for abscess.  Weight has declined.   ? ?Patient agreeable to prior authorization being sent to [facility] to see [place] Medicaid will cover oral nutrition supplements.  Patient aware this may take 2-3 weeks.  ? ?Barry [name] B. Zenia Resides, RD, LDN ?Registered Dietitian ?[phone] ? ?

## 2021-12-27 ENCOUNTER — Telehealth: Payer: Self-pay

## 2021-12-27 NOTE — Telephone Encounter (Signed)
Ensure prescription faxed to [facility].  ?

## 2021-12-27 NOTE — Telephone Encounter (Signed)
-----   Message from Jennet Maduro, [place] sent at 12/26/2021  4:15 PM EDT ----- ?Patient has a [place] Medicaid plan and are eligible for coverage of oral nutrition supplements after submitting prior authorization.  ? ?Please submit the following to [facility] [phone] fax).   ? ?Signed Rx from provider (Recommend ensure plus TID) ?Chart notes from recent MD visit ?Demographic sheet.  ? ?It may take 2-3 weeks for review.   ? ?Let me know if you have questions.  ? ?Thanks, ?Joli ? ?

## 2021-12-27 NOTE — Telephone Encounter (Signed)
Nutrition ? ?Received message from [place] that [facility] Family Plan does not cover oral nutrition supplements.  [employer] has talked with patient.   ?RD called patient as well and he is aware.   ? ?Barry [name], RD, LDN ?Registered Dietitian ?[phone] ? ?

## 2021-12-31 ENCOUNTER — Encounter: Payer: Self-pay | Admitting: Pharmacist

## 2022-01-01 ENCOUNTER — Encounter: Payer: Self-pay | Admitting: Pharmacist

## 2022-01-10 ENCOUNTER — Encounter: Payer: Self-pay | Admitting: Nurse Practitioner

## 2022-01-10 NOTE — Telephone Encounter (Signed)
Shirlean Mylar, would you be able to get records for patient ?

## 2022-01-14 NOTE — Telephone Encounter (Signed)
Barry [name], would pt be able to pick up records tomorrow?  ? ?

## 2022-01-15 ENCOUNTER — Encounter: Payer: Self-pay | Admitting: Oncology

## 2022-01-15 ENCOUNTER — Inpatient Hospital Stay: Payer: Medicaid Other | Attending: Oncology

## 2022-01-15 ENCOUNTER — Ambulatory Visit
Admission: RE | Admit: 2022-01-15 | Discharge: 2022-01-15 | Disposition: A | Discharge status: Home / Self Care | Payer: Medicaid Other | Source: Ambulatory Visit | Attending: Radiation Oncology | Admitting: Radiation Oncology

## 2022-01-15 ENCOUNTER — Encounter: Payer: Self-pay | Admitting: Radiation Oncology

## 2022-01-15 ENCOUNTER — Inpatient Hospital Stay (HOSPITAL_BASED_OUTPATIENT_CLINIC_OR_DEPARTMENT_OTHER): Payer: Medicaid Other | Admitting: Oncology

## 2022-01-15 ENCOUNTER — Ambulatory Visit: Payer: Medicaid Other | Admitting: Internal Medicine

## 2022-01-15 VITALS — BP 125/78 | HR 89 | Temp 97.7°F | Resp 18 | Ht 68.0 in | Wt 152.0 lb

## 2022-01-15 VITALS — BP 133/90 | HR 82 | Temp 96.6°F | Resp 18 | Wt 152.2 lb

## 2022-01-15 DIAGNOSIS — D701 Agranulocytosis secondary to cancer chemotherapy: Secondary | ICD-10-CM

## 2022-01-15 DIAGNOSIS — K612 Anorectal abscess: Secondary | ICD-10-CM | POA: Insufficient documentation

## 2022-01-15 DIAGNOSIS — K611 Rectal abscess: Secondary | ICD-10-CM | POA: Insufficient documentation

## 2022-01-15 DIAGNOSIS — C20 Malignant neoplasm of rectum: Secondary | ICD-10-CM | POA: Insufficient documentation

## 2022-01-15 DIAGNOSIS — Z933 Colostomy status: Secondary | ICD-10-CM | POA: Insufficient documentation

## 2022-01-15 DIAGNOSIS — T451X5A Adverse effect of antineoplastic and immunosuppressive drugs, initial encounter: Secondary | ICD-10-CM

## 2022-01-15 DIAGNOSIS — Z9221 Personal history of antineoplastic chemotherapy: Secondary | ICD-10-CM | POA: Insufficient documentation

## 2022-01-15 DIAGNOSIS — Z5111 Encounter for antineoplastic chemotherapy: Secondary | ICD-10-CM

## 2022-01-15 DIAGNOSIS — Z923 Personal history of irradiation: Secondary | ICD-10-CM | POA: Insufficient documentation

## 2022-01-15 DIAGNOSIS — R911 Solitary pulmonary nodule: Secondary | ICD-10-CM

## 2022-01-15 LAB — CBC WITH DIFFERENTIAL/PLATELET
Abs Immature Granulocytes: 0.02 10*3/uL (ref 0.00–0.07)
Basophils Absolute: 0 10*3/uL (ref 0.0–0.1)
Basophils Relative: 1 %
Eosinophils Absolute: 0.3 10*3/uL (ref 0.0–0.5)
Eosinophils Relative: 6 %
HCT: 34.3 % — ABNORMAL LOW (ref 39.0–52.0)
Hemoglobin: 11.2 g/dL — ABNORMAL LOW (ref 13.0–17.0)
Immature Granulocytes: 0 %
Lymphocytes Relative: 24 %
Lymphs Abs: 1.2 10*3/uL (ref 0.7–4.0)
MCH: 27.4 pg (ref 26.0–34.0)
MCHC: 32.7 g/dL (ref 30.0–36.0)
MCV: 83.9 fL (ref 80.0–100.0)
Monocytes Absolute: 0.6 10*3/uL (ref 0.1–1.0)
Monocytes Relative: 11 %
Neutro Abs: 3 10*3/uL (ref 1.7–7.7)
Neutrophils Relative %: 58 %
Platelets: 434 10*3/uL — ABNORMAL HIGH (ref 150–400)
RBC: 4.09 MIL/uL — ABNORMAL LOW (ref 4.22–5.81)
RDW: 16.4 % — ABNORMAL HIGH (ref 11.5–15.5)
WBC: 5.1 10*3/uL (ref 4.0–10.5)
nRBC: 0 % (ref 0.0–0.2)

## 2022-01-15 LAB — COMPREHENSIVE METABOLIC PANEL
ALT: 11 U/L (ref 0–44)
AST: 15 U/L (ref 15–41)
Albumin: 3.3 g/dL — ABNORMAL LOW (ref 3.5–5.0)
Alkaline Phosphatase: 75 U/L (ref 38–126)
Anion gap: 7 (ref 5–15)
BUN: 9 mg/dL (ref 6–20)
CO2: 26 mmol/L (ref 22–32)
Calcium: 8.6 mg/dL — ABNORMAL LOW (ref 8.9–10.3)
Chloride: 102 mmol/L (ref 98–111)
Creatinine, Ser: 0.88 mg/dL (ref 0.61–1.24)
GFR, Estimated: >60 mL/min (ref >60)
Glucose, Bld: 89 mg/dL (ref 70–99)
Potassium: 3.7 mmol/L (ref 3.5–5.1)
Sodium: 135 mmol/L (ref 135–145)
Total Bilirubin: 0.4 mg/dL (ref 0.3–1.2)
Total Protein: 8.4 g/dL — ABNORMAL HIGH (ref 6.5–8.1)

## 2022-01-15 NOTE — Progress Notes (Signed)
Radiation Oncology ?Follow up Note ? ?Name: Barry [name].   ?Date:   01/15/2022 ?MRN:  [id number] ?DOB: 07-01-1966  ? ? ?This 56 y.o. male presents to the clinic today for 34-monthfollow-up status post neoadjuvant chemoradiation therapy for locally advanced rectal cancer. ? ?REFERRING PROVIDER: No ref. provider found ? ?HPI: Patient is a 56year old male now out 4 months having completed neoadjuvant chemotherapy and radiation for stage IIIb adenocarcinoma the rectum.  He had completed FOLFOX chemotherapy followed by concurrent chemoradiation therapy.  [facility]  Patient underwent APR with pathology showing a 6.8 cm moderately differentiated adenocarcinoma with positive radial margin.  No lymph nodes were involved.  [facility]  Patient has developed an abscess currently has.  Drain present.  He is also been on multiple antibiotics.  His colostomy is functioning well.  He is otherwise doing fairly well at this time.  His last CT scan was back in early April showing a 6 cm x 18 cm ill-defined fluid and gas collection in the pelvic surgical bed extending from the perineum superiorly compatible infected collection abscess. ? ?COMPLICATIONS OF TREATMENT: none ? ?FOLLOW UP COMPLIANCE: keeps appointments  ? ?PHYSICAL EXAM: Patient is a functioning colostomy.  Rectal tube also present.  Well-developed well-nourished patient in NAD. HEENT reveals PERLA, EOMI, discs not visualized.  Oral cavity is clear. No oral mucosal lesions are identified. Neck is clear without evidence of cervical or supraclavicular adenopathy. Lungs are clear to A&P. Cardiac examination is essentially unremarkable with regular rate and rhythm without murmur rub or thrill. Abdomen is benign with no organomegaly or masses noted. Motor sensory and DTR levels are equal and symmetric in the upper and lower extremities. Cranial nerves II through XII are grossly intact. Proprioception is intact. No  peripheral adenopathy or edema is identified. No motor or sensory levels are noted. Crude visual fields are within normal range. ?BP 125/78   Pulse 89   Temp 97.7 ?F (36.5 ?C)   Resp 18   Ht '5\' 8"'$  (1.727 m)   Wt 152 lb (68.9 kg)   BMI 23.11 kg/m?  ? ?RADIOLOGY RESULTS: CT scan of abdomen pelvis reviewed compatible with above-stated findings ? ?PLAN: Present [facility].  He is recovering from that slowly.  [facility].  I have asked to see him back in 6 months for follow-up.  He otherwise seems to have had an excellent response to neoadjuvant chemotherapy with no lymph node involvement at the time of surgery.  Patient knows to call with any concerns. ? ?I would like to take this opportunity to thank you for allowing me to participate in the care of your patient.. ?  ? GNoreene Filbert MD ? ?

## 2022-01-15 NOTE — Progress Notes (Signed)
Patient here for follow up. Pt has rectal tube in place.  ?

## 2022-01-15 NOTE — Progress Notes (Signed)
?Hematology/Oncology Progress note ?Telephone:(336) [phone] Fax:([phone]) [phone] ?  ? ? ? ?Patient Care Team: ?Pcp, No as PCP - General ?[name], RN as Oncology Nurse Navigator ? ?CHIEF COMPLAINTS/REASON FOR VISIT:  ?Follow up for rectal cancer treatment ? ?HISTORY OF PRESENTING ILLNESS:  ? ?Barry [name]. is a  56 y.o.  male with PMH listed below was seen in consultation at the request of  No ref. provider found  for evaluation of rectal cancer ? ?02/05/2021-02/06/2021 patient was hospitalized due to generalized weakness, intermittent lightheadedness, weight loss and worsening constipation.  02/05/2021 CT abdomen showed concerning of severe rectal wall thickening and right internal iliac lymph node concerning for metastatic disease.  Patient was seen by gastroenterology and had colonoscopy which showed a circumferential fungating mass in the rectum.  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 ? ?02/14/2021-02/16/2021 hospitalized due to rectal bleeding and pain.   ?02/14/2021 CT showed perirectal fluid collection/gas concerning for infection with significant leukocytosis, anemia with hemoglobin of 6.8.  Patient received PRBC transfusion, IV antibiotics.  He underwent an IR guided placement of JP drain into the rectal abscess.  Discharged home with oral Augmentin. ?02/20/2021 presented to ER with new skin opening and draining from left gluteus.  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  JP drain has been removed.  Patient was continued on Augmentin. ? ?02/13/2021, PET scan showed locally advanced rectal cancer with billowing of the mesorectum now with low attenuation material that was not present on previous examination with extensive stranding and inflammation.   ?Bulky RIGHT pelvic sidewall/hypogastric lymph node outside of the mesorectum with stippled calcification measuring 19 mm-no increased metabolic activity. ?Small LEFT hypogastric lymph node just peripheral to the internal  external bifurcation-SUV 3.2 ?High RIGHT internal just below or at the internal/common iliac transition, lymph node -SUV 4.8 ?LEFT high hypogastric lymph node  8 mm-SUV 3. ?Scattered lymph nodes throughout the retroperitoneum with low FDG uptake ?Bilateral inguinal lymph nodes largest on the RIGHT (image 248/3) 11 mm with a maximum SUV of 2.8 spiculated nodule in the LEFT upper lobe- 9 x 8 mm ? ? ? ?02/14/2021, CT abdomen pelvis without contrast Showed perforated rectal mass with contained perforation with fluid and gas extending above and below the pelvic floor,potentially involving the sphincter complex and extending into LEFT ischial rectal fossa ? ?02/20/2021, CT pelvis with contrast showed Large perirectal/perianal abscess has markedly decreased in size ?since placement of the percutaneous drain. There are residual gas-filled collections in the soft tissues and suspect there is a fistula or sinus tract between the rectal mass and the subcutaneous tissues. Soft tissue gas along the medial left buttock and concern for a cutaneous ulceration in this area. Large rectal mass with evidence for a large necrotic right pelvic lymph node. ? ?His case is complicated with a perirectal abscess. ?Prior to onset of perirectal abscess, on his 02/05/2021 scan, he was noted to have right internal iliac lymph node 3 x 1 x 2.3 cm which is concerning for nodal disease.On Subsequent images it was difficult to distinguish whether lymphadenopathy was due to nodal disease versus acute inflammation. 03/07/2021 MRI pelvis cT3 N2. Due to the possible contained perforation on previous CT, possible cT4 disease.  ? ?02/27/2021 medi port placed by Dr.Dew ?03/13/2021 reports right butt cheek and also left perianal area fullness.he was seen by Dr.Pabon urgently  and had right buttock abscess drained. Left perianal fullness was felt to be due to cancer.  ? ?03/13/2021 started FOLOFX ? ?#  He was seen by [facility] Dr.Mantyh who agrees with TNT followed by  surgery. ?# Emphysema, recommend him to establish care with PCP. He was provided with a list of PCP office contact information.  ? ?11/18/2021, patient is status post open APR with flap for rectal cancer. ?Pathology rectal adenomacarcinoma ypT3N0.  Positive margin: Radial (circumferential) or mesenteric: adjacent to perforation  ?Patient presented to emergency room on 12/08/21, rectal pain.  CT scan showed 18.3 cm fluid and gas collection in the pelvic surgical bed compatible with infected collection/abscess.  Patient was sent to [facility] and admitted for.  Patient has CT-guided drain placement on 12/09/2021.  Blood culture positive for Staph epidermidis which was felt to be contaminant.  Fluid culture grew pansensitive Staph aureus, strep pyogenes and candida albicans.  Patient was treated with IV antibiotics and transition to p.o. Augmentin and fluconazole on 12/11/2021. ? ?INTERVAL HISTORY ?Barry [name]. is a 56 y.o. male who has above history reviewed by me today presents for follow up visit for management of rectal cancer. ? ?#Perirectal abscess status post drainage catheter , patient was recently seen by [facility] oncology surgeon on 12/24/2021 and was recommended to keep drainage catheter and continue antibiotics. ?Patient had a repeat CT scan done which showed a smaller but persistent abscess.  He will return to [facility] surgery for follow-up of drain removal. ?His case was discussed on [name] tumor board for the positive mesenteric margin which was felt to be secondary to previous perforation.  Recommend surveillance. ?Today patient denies fever, chills,+ rectal pain. ? ?Review of Systems  ?Constitutional:  Negative for appetite change, chills, fatigue, fever and unexpected weight change.  ?HENT:   Negative for hearing loss and voice change.   ?Eyes:  Negative for eye problems and icterus.  ?Respiratory:  Negative for chest tightness, cough and shortness of breath.   ?Cardiovascular:  Negative for chest pain and leg  swelling.  ?Gastrointestinal:  Positive for rectal pain. Negative for abdominal distention, abdominal pain, blood in stool, constipation and diarrhea.  ?Endocrine: Negative for hot flashes.  ?Genitourinary:  Negative for difficulty urinating, dysuria and frequency.   ?Musculoskeletal:  Negative for arthralgias.  ?Skin:  Negative for itching and rash.  ?Neurological:  Negative for light-headedness and numbness.  ?Hematological:  Negative for adenopathy. Does not bruise/bleed easily.  ?Psychiatric/Behavioral:  Negative for confusion.   ? ?MEDICAL HISTORY:  ?Past Medical History:  ?Diagnosis Date  ? Headache   ? Left shoulder pain   ? Rectal cancer ([place])   ? ? ?SURGICAL HISTORY: ?Past Surgical History:  ?Procedure Laterality Date  ? COLON SURGERY    ? COLONOSCOPY WITH PROPOFOL N/A 02/06/2021  ? Procedure: COLONOSCOPY WITH PROPOFOL;  Surgeon: Lin Landsman, MD;  Location: [facility] ENDOSCOPY;  Service: Gastroenterology;  Laterality: N/A;  ? PORTA CATH INSERTION N/A 02/27/2021  ? Procedure: PORTA CATH INSERTION;  Surgeon: Algernon Huxley, MD;  Location: [place] CV LAB;  Service: Cardiovascular;  Laterality: N/A;  ? ROTATOR CUFF REPAIR Left   ? ? ?SOCIAL HISTORY: ?Social History  ? ?Socioeconomic History  ? Marital status: Married  ?  Spouse name: Not on file  ? Number of children: Not on file  ? Years of education: Not on file  ? Highest education level: Not on file  ?Occupational History  ? Not on file  ?Tobacco Use  ? Smoking status: Former  ?  Packs/day: 1.00  ?  Years: 10.00  ?  Pack years: 10.00  ?  Types: Cigars,  Cigarettes  ?  Quit date: 02/04/2021  ?  Years since quitting: 0.9  ? Smokeless tobacco: Never  ?Substance and Sexual Activity  ? Alcohol use: Not Currently  ? Drug use: No  ? Sexual activity: Not on file  ?Other Topics Concern  ? Not on file  ?Social History Narrative  ? Not on file  ? ?Social Determinants of Health  ? ?Financial Resource Strain: Not on file  ?Food Insecurity: Not on file   ?Transportation Needs: Not on file  ?Physical Activity: Not on file  ?Stress: Not on file  ?Social Connections: Not on file  ?Intimate Partner Violence: Not on file  ? ? ?FAMILY HISTORY: ?Family History  ?Problem Relation Age o

## 2022-01-27 ENCOUNTER — Encounter: Payer: Self-pay | Admitting: Oncology

## 2022-01-27 ENCOUNTER — Other Ambulatory Visit: Payer: Self-pay

## 2022-01-27 ENCOUNTER — Emergency Department
Admission: EM | Admit: 2022-01-27 | Discharge: 2022-01-27 | Disposition: A | Discharge status: Skilled Nursing Facility | Payer: Self-pay | Attending: Emergency Medicine | Admitting: Emergency Medicine

## 2022-01-27 ENCOUNTER — Emergency Department: Payer: Self-pay

## 2022-01-27 DIAGNOSIS — Z20822 Contact with and (suspected) exposure to covid-19: Secondary | ICD-10-CM | POA: Insufficient documentation

## 2022-01-27 DIAGNOSIS — C20 Malignant neoplasm of rectum: Secondary | ICD-10-CM | POA: Insufficient documentation

## 2022-01-27 LAB — COMPREHENSIVE METABOLIC PANEL
ALT: 11 U/L (ref 0–44)
AST: 14 U/L — ABNORMAL LOW (ref 15–41)
Albumin: 3.2 g/dL — ABNORMAL LOW (ref 3.5–5.0)
Alkaline Phosphatase: 78 U/L (ref 38–126)
Anion gap: 7 (ref 5–15)
BUN: 8 mg/dL (ref 6–20)
CO2: 27 mmol/L (ref 22–32)
Calcium: 8.5 mg/dL — ABNORMAL LOW (ref 8.9–10.3)
Chloride: 103 mmol/L (ref 98–111)
Creatinine, Ser: 0.71 mg/dL (ref 0.61–1.24)
GFR, Estimated: >60 mL/min (ref >60)
Glucose, Bld: 87 mg/dL (ref 70–99)
Potassium: 3.3 mmol/L — ABNORMAL LOW (ref 3.5–5.1)
Sodium: 137 mmol/L (ref 135–145)
Total Bilirubin: 0.5 mg/dL (ref 0.3–1.2)
Total Protein: 8 g/dL (ref 6.5–8.1)

## 2022-01-27 LAB — CBC
HCT: 29.4 % — ABNORMAL LOW (ref 39.0–52.0)
Hemoglobin: 9.7 g/dL — ABNORMAL LOW (ref 13.0–17.0)
MCH: 27.2 pg (ref 26.0–34.0)
MCHC: 33 g/dL (ref 30.0–36.0)
MCV: 82.4 fL (ref 80.0–100.0)
Platelets: 300 10*3/uL (ref 150–400)
RBC: 3.57 MIL/uL — ABNORMAL LOW (ref 4.22–5.81)
RDW: 16.5 % — ABNORMAL HIGH (ref 11.5–15.5)
WBC: 11.4 10*3/uL — ABNORMAL HIGH (ref 4.0–10.5)
nRBC: 0 % (ref 0.0–0.2)

## 2022-01-27 LAB — RESP PANEL BY RT-PCR (FLU A&B, COVID) ARPGX2
Influenza A by PCR: NEGATIVE
Influenza B by PCR: NEGATIVE
SARS Coronavirus 2 by RT PCR: NEGATIVE

## 2022-01-27 LAB — LACTIC ACID, PLASMA: Lactic Acid, Venous: 0.6 mmol/L (ref 0.5–1.9)

## 2022-01-27 MED ORDER — LACTATED RINGERS IV BOLUS
1000.0000 mL | Freq: Once | INTRAVENOUS | Status: AC
  Administered 2022-01-27: 1000 mL via INTRAVENOUS

## 2022-01-27 MED ORDER — IOHEXOL 350 MG/ML SOLN
75.0000 mL | Freq: Once | INTRAVENOUS | Status: AC | PRN
  Administered 2022-01-27: 75 mL via INTRAVENOUS

## 2022-01-27 MED ORDER — PIPERACILLIN-TAZOBACTAM 3.375 G IVPB 30 MIN
3.3750 g | Freq: Once | INTRAVENOUS | Status: AC
  Administered 2022-01-27: 3.375 g via INTRAVENOUS
  Filled 2022-01-27: qty 50

## 2022-01-27 MED ORDER — ONDANSETRON HCL 4 MG/2ML IJ SOLN
4.0000 mg | Freq: Once | INTRAMUSCULAR | Status: AC
  Administered 2022-01-27: 4 mg via INTRAVENOUS
  Filled 2022-01-27: qty 2

## 2022-01-27 MED ORDER — MORPHINE SULFATE (PF) 4 MG/ML IV SOLN
4.0000 mg | Freq: Once | INTRAVENOUS | Status: AC
Start: 2022-01-27 — End: 2022-01-27
  Administered 2022-01-27: 4 mg via INTRAVENOUS

## 2022-01-27 MED ORDER — MORPHINE SULFATE (PF) 4 MG/ML IV SOLN
6.0000 mg | Freq: Once | INTRAVENOUS | Status: AC
  Administered 2022-01-27: 6 mg via INTRAVENOUS
  Filled 2022-01-27: qty 2

## 2022-01-27 MED ORDER — MORPHINE SULFATE (PF) 4 MG/ML IV SOLN
4.0000 mg | Freq: Once | INTRAVENOUS | Status: DC
  Filled 2022-01-27: qty 1

## 2022-01-27 NOTE — ED Notes (Signed)
Pt refused PIV at this time. Pt requesting port to be accessed. IV team consult placed.

## 2022-01-27 NOTE — ED Triage Notes (Signed)
Pt here with post op complications. Pt has an ostomy placed and states "they sewed my bottom up and now it hurts really bad". Pt also states he had drain removed last Wed. Pt states his bottom is swollen and hard. Pt having trouble sitting on his butt, leaning to his left side. Pt states he thinks that pus may be coming from the site, he is unsure but denies odor.

## 2022-01-27 NOTE — ED Provider Notes (Signed)
[facility] Provider Note    Event Date/Time   First MD Initiated Contact with Patient 01/27/22 1321     (approximate)   History   Post-op Problem   HPI  Barry [name]. is a 56 y.o. male   here with rectal pain and swelling.  The patient has a complex recent history including rectal cancer status post recent pelvic drain and removal at [facility].  The drain was remain last week.  He states that since then, he has had induration and pain along his right buttocks, near the area of the drainage.  He has had some persistent drainage that seems to have slightly increased.  Over the last several days, he has had increasing pain, now has severe difficulty even sitting on it.  He has had some subjective chills.  It feels somewhat similar to when he has pelvic abscess although that also hurt more in the front and has had no anterior abdominal pain.  No urinary symptoms.  No other complaints.      Physical Exam   Triage Vital Signs: ED Triage Vitals  Enc Vitals Group     BP 01/27/22 1006 132/80     Pulse Rate 01/27/22 1006 (!) 113     Resp 01/27/22 1006 18     Temp 01/27/22 1006 98.9 F (37.2 C)     Temp Source 01/27/22 1006 Oral     SpO2 01/27/22 1006 100 %     Weight 01/27/22 1007 153 lb (69.4 kg)     Height 01/27/22 1007 '5\' 8"'$  (1.727 m)     Head Circumference --      Peak Flow --      Pain Score 01/27/22 1007 10     Pain Loc --      Pain Edu? --      Excl. in [place]? --     Most recent vital signs: Vitals:   01/27/22 1700 01/27/22 1730  BP: (!) 143/88 (!) 141/88  Pulse: 89 82  Resp: 18 15  Temp:    SpO2: 98% 98%     General: Awake, no distress.  CV:  Good peripheral perfusion.  Resp:  Normal effort.  Abd:  No distention.  Minimal suprapubic tenderness.  Left-sided ostomy draining soft, brown stool. Other:  Marked induration and tenderness of the right buttocks along the gluteal fold, with area of foul-smelling, purulent drainage from the  previous drain site.  Some extension to the left medial buttocks.   ED Results / Procedures / Treatments   Labs (all labs ordered are listed, but only abnormal results are displayed) Labs Reviewed  CBC - Abnormal; Notable for the following components:      Result Value   WBC 11.4 (*)    RBC 3.57 (*)    Hemoglobin 9.7 (*)    HCT 29.4 (*)    RDW 16.5 (*)    All other components within normal limits  COMPREHENSIVE METABOLIC PANEL - Abnormal; Notable for the following components:   Potassium 3.3 (*)    Calcium 8.5 (*)    Albumin 3.2 (*)    AST 14 (*)    All other components within normal limits  RESP PANEL BY RT-PCR (FLU A&B, COVID) ARPGX2  CULTURE, BLOOD (SINGLE)  LACTIC ACID, PLASMA     EKG    RADIOLOGY Pending     PROCEDURES:  Critical Care performed: No   MEDICATIONS ORDERED IN ED: Medications  piperacillin-tazobactam (ZOSYN) IVPB 3.375 g (3.375  g Intravenous New Bag/Given 01/27/22 1747)  lactated ringers bolus 1,000 mL (0 mLs Intravenous Stopped 01/27/22 1633)  morphine (PF) 4 MG/ML injection 6 mg (6 mg Intravenous Given 01/27/22 1520)  ondansetron (ZOFRAN) injection 4 mg (4 mg Intravenous Given 01/27/22 1521)  iohexol (OMNIPAQUE) 350 MG/ML injection 75 mL (75 mLs Intravenous Contrast Given 01/27/22 1645)     IMPRESSION / MDM / ASSESSMENT AND PLAN / ED COURSE  I reviewed the triage vital signs and the nursing notes.                              Ddx:  Differential includes the following, with pertinent life- or limb-threatening emergencies considered:  Gluteal abscess, pelvic abscess, cellulitis, skin related abscess, radiation changes   MDM:  56 year old male with history of rectal cancer currently treated at [facility] here with severe buttocks pain and drainage. Pt just had drain removed at outpt visit. Primary suspicion is recurrent pelvic/perineal abscess. Pt is o/w nontoxic, without signs of severe sepsis. Lab work is pending.   Plan to f/u labs, imaging.  Suspect pt will likely need IR vs surgical consultation.    MEDICATIONS GIVEN IN ED: Medications  piperacillin-tazobactam (ZOSYN) IVPB 3.375 g (3.375 g Intravenous New Bag/Given 01/27/22 1747)  lactated ringers bolus 1,000 mL (0 mLs Intravenous Stopped 01/27/22 1633)  morphine (PF) 4 MG/ML injection 6 mg (6 mg Intravenous Given 01/27/22 1520)  ondansetron (ZOFRAN) injection 4 mg (4 mg Intravenous Given 01/27/22 1521)  iohexol (OMNIPAQUE) 350 MG/ML injection 75 mL (75 mLs Intravenous Contrast Given 01/27/22 1645)     Consults:  Pending   EMR reviewed  Reviewed [facility] notes, including recent removal of IR drain 5/17     FINAL CLINICAL IMPRESSION(S) / ED DIAGNOSES   Final diagnoses:  Rectal cancer ([place])     Rx / DC Orders   ED Discharge Orders     None        Note:  This document was prepared using Dragon voice recognition software and may include unintentional dictation errors.   Duffy Bruce, MD 01/27/22 [phone]

## 2022-01-27 NOTE — ED Notes (Signed)
IV team at bedside accessing port. 

## 2022-01-27 NOTE — ED Notes (Signed)
Report given to [facility]. Per [name], they will be here to transport pt between 9-10.

## 2022-01-27 NOTE — ED Notes (Signed)
Wants port accessed

## 2022-01-27 NOTE — ED Provider Notes (Signed)
Patient was signed out to me at 3 PM.  In brief he is a 56 year old with a history of rectal cancer and recent pelvic abscess status post drain placement who presents with drainage from site of recent drain removal.  Exam is consistent with at least a cellulitis and there is purulence coming from the area where the drain was.  At the time of signout he is pending labs and a CT and likely discussion with [name].  CT shows a large fluid collection in the pelvis.  Discussed with transfer center at [facility]  Dr. Hester Mates who did the surgery initially saying that he can see the patient in clinic however did not review the imaging.  Did not feel that this was appropriate given the large fluid collection.  The other option is for patient to go ED to ED which I think would be most appropriate.  He was given a dose of Zosyn.   Rada Hay, MD 01/27/22 [phone]

## 2022-01-27 NOTE — ED Notes (Signed)
Pt got accepted to [facility] ED. Accepting physician Dr. Wynetta Fines. Report [phone]

## 2022-02-01 LAB — CULTURE, BLOOD (SINGLE)
Culture: NO GROWTH
Special Requests: ADEQUATE

## 2022-02-10 ENCOUNTER — Inpatient Hospital Stay: Payer: Self-pay | Attending: Oncology

## 2022-02-10 DIAGNOSIS — Z452 Encounter for adjustment and management of vascular access device: Secondary | ICD-10-CM | POA: Insufficient documentation

## 2022-02-10 DIAGNOSIS — R911 Solitary pulmonary nodule: Secondary | ICD-10-CM | POA: Insufficient documentation

## 2022-02-10 DIAGNOSIS — Z9221 Personal history of antineoplastic chemotherapy: Secondary | ICD-10-CM | POA: Insufficient documentation

## 2022-02-10 DIAGNOSIS — C20 Malignant neoplasm of rectum: Secondary | ICD-10-CM | POA: Insufficient documentation

## 2022-02-10 DIAGNOSIS — Z933 Colostomy status: Secondary | ICD-10-CM | POA: Insufficient documentation

## 2022-02-10 DIAGNOSIS — Z87891 Personal history of nicotine dependence: Secondary | ICD-10-CM | POA: Insufficient documentation

## 2022-02-10 DIAGNOSIS — Z95828 Presence of other vascular implants and grafts: Secondary | ICD-10-CM

## 2022-02-10 MED ORDER — SODIUM CHLORIDE 0.9% FLUSH
10.0000 mL | INTRAVENOUS | Status: DC | PRN
  Administered 2022-02-10: 10 mL via INTRAVENOUS
  Filled 2022-02-10: qty 10

## 2022-02-10 MED ORDER — HEPARIN SOD (PORK) LOCK FLUSH 100 UNIT/ML IV SOLN
500.0000 [IU] | Freq: Once | INTRAVENOUS | Status: AC
  Administered 2022-02-10: 500 [IU] via INTRAVENOUS
  Filled 2022-02-10: qty 5

## 2022-02-22 ENCOUNTER — Other Ambulatory Visit: Payer: Self-pay | Admitting: Nurse Practitioner

## 2022-02-25 ENCOUNTER — Encounter: Payer: Self-pay | Admitting: Emergency Medicine

## 2022-02-25 ENCOUNTER — Emergency Department: Payer: Self-pay

## 2022-02-25 ENCOUNTER — Emergency Department
Admission: EM | Admit: 2022-02-25 | Discharge: 2022-02-25 | Disposition: A | Discharge status: Home / Self Care | Payer: Self-pay | Attending: Emergency Medicine | Admitting: Emergency Medicine

## 2022-02-25 DIAGNOSIS — K529 Noninfective gastroenteritis and colitis, unspecified: Secondary | ICD-10-CM | POA: Insufficient documentation

## 2022-02-25 DIAGNOSIS — Z85048 Personal history of other malignant neoplasm of rectum, rectosigmoid junction, and anus: Secondary | ICD-10-CM | POA: Insufficient documentation

## 2022-02-25 DIAGNOSIS — R7309 Other abnormal glucose: Secondary | ICD-10-CM | POA: Insufficient documentation

## 2022-02-25 DIAGNOSIS — M546 Pain in thoracic spine: Secondary | ICD-10-CM | POA: Insufficient documentation

## 2022-02-25 LAB — CBC
HCT: 34.9 % — ABNORMAL LOW (ref 39.0–52.0)
Hemoglobin: 11.4 g/dL — ABNORMAL LOW (ref 13.0–17.0)
MCH: 26.5 pg (ref 26.0–34.0)
MCHC: 32.7 g/dL (ref 30.0–36.0)
MCV: 81.2 fL (ref 80.0–100.0)
Platelets: 302 10*3/uL (ref 150–400)
RBC: 4.3 MIL/uL (ref 4.22–5.81)
RDW: 19.6 % — ABNORMAL HIGH (ref 11.5–15.5)
WBC: 6.6 10*3/uL (ref 4.0–10.5)
nRBC: 0 % (ref 0.0–0.2)

## 2022-02-25 LAB — HEPATIC FUNCTION PANEL
ALT: 13 U/L (ref 0–44)
AST: 15 U/L (ref 15–41)
Albumin: 3.5 g/dL (ref 3.5–5.0)
Alkaline Phosphatase: 81 U/L (ref 38–126)
Bilirubin, Direct: <0.1 mg/dL (ref 0.0–0.2)
Total Bilirubin: 0.4 mg/dL (ref 0.3–1.2)
Total Protein: 7.7 g/dL (ref 6.5–8.1)

## 2022-02-25 LAB — URINALYSIS, ROUTINE W REFLEX MICROSCOPIC
Bilirubin Urine: NEGATIVE
Glucose, UA: NEGATIVE mg/dL
Hgb urine dipstick: NEGATIVE
Ketones, ur: NEGATIVE mg/dL
Leukocytes,Ua: NEGATIVE
Nitrite: NEGATIVE
Protein, ur: NEGATIVE mg/dL
Specific Gravity, Urine: 1.02 (ref 1.005–1.030)
pH: 6 (ref 5.0–8.0)

## 2022-02-25 LAB — BASIC METABOLIC PANEL
Anion gap: 8 (ref 5–15)
BUN: 7 mg/dL (ref 6–20)
CO2: 25 mmol/L (ref 22–32)
Calcium: 8.9 mg/dL (ref 8.9–10.3)
Chloride: 107 mmol/L (ref 98–111)
Creatinine, Ser: 0.77 mg/dL (ref 0.61–1.24)
GFR, Estimated: >60 mL/min (ref >60)
Glucose, Bld: 109 mg/dL — ABNORMAL HIGH (ref 70–99)
Potassium: 3.5 mmol/L (ref 3.5–5.1)
Sodium: 140 mmol/L (ref 135–145)

## 2022-02-25 MED ORDER — MORPHINE SULFATE (PF) 4 MG/ML IV SOLN
4.0000 mg | Freq: Once | INTRAVENOUS | Status: AC
  Administered 2022-02-25: 4 mg via INTRAVENOUS
  Filled 2022-02-25: qty 1

## 2022-02-25 MED ORDER — LACTATED RINGERS IV BOLUS
1000.0000 mL | Freq: Once | INTRAVENOUS | Status: AC
  Administered 2022-02-25: 1000 mL via INTRAVENOUS

## 2022-02-25 MED ORDER — LIDOCAINE-EPINEPHRINE-TETRACAINE (LET) TOPICAL GEL
3.0000 mL | Freq: Once | TOPICAL | Status: DC
  Filled 2022-02-25: qty 3

## 2022-02-25 MED ORDER — IOHEXOL 350 MG/ML SOLN
80.0000 mL | Freq: Once | INTRAVENOUS | Status: AC | PRN
  Administered 2022-02-25: 80 mL via INTRAVENOUS

## 2022-02-25 NOTE — ED Provider Triage Note (Signed)
Emergency Medicine Provider Triage Evaluation Note  Barry [name]. , a 56 y.o. male  was evaluated in triage.  Pt complains of drainage from sacrum area where he is being treated for infection.  Spent 6 days in the hospital for IV antibiotics..  Review of Systems  Positive: Pus and drainage, very tired fatigued Negative: Fever  Physical Exam  BP 122/89 (BP Location: Right Arm)   Pulse 98   Temp 98.6 F (37 C) (Oral)   Resp 18   Ht '5\' 8"'$  (1.727 m)   Wt 69.4 kg   SpO2 99%   BMI 23.26 kg/m  Gen:   Awake, no distress   Resp:  Normal effort  MSK:   Moves extremities without difficulty  Other:    Medical Decision Making  Medically screening exam initiated at 6:40 PM.  Appropriate orders placed.  Barry [name]. was informed that the remainder of the evaluation will be completed by another provider, this initial triage assessment does not replace that evaluation, and the importance of remaining in the ED until their evaluation is complete.  Patient did mention that he spoke with his doctor from [facility] and felt like he may need to be transferred there.  Explained to him that will depend on his lab work and whether or not we can address his problems here   Versie Starks, PA-C 02/25/22 1841

## 2022-02-25 NOTE — Discharge Instructions (Addendum)
Your CT today showed: IMPRESSION: 1. Negative for acute pulmonary embolus. 2. Emphysema. Further decrease in size of the previously noted irregular nodule in the left upper lobe which is now barely measurable today. No new suspicious lung nodules 3. Status post left lower quadrant colostomy. Further decrease in size since comparison exam from May of the rim enhancing gas and fluid collection at the pelvic surgical bed extending from the perineum superiorly into the pelvis 4. Slightly thickened appearance of terminal ileal small bowel loops in the pelvis with mild stranding suggesting small bowel inflammatory process. 5. Stable enlarged right pelvic sidewall lymph node

## 2022-02-25 NOTE — ED Triage Notes (Signed)
Pt reports taking abx for a infection on his sacrum. Had drain removed 3 weeks ago. Today he started having back pain with deep breaths and lethargy.

## 2022-02-25 NOTE — ED Notes (Signed)
Pt A&O, IV removed, pt given discharge instructions, pt ambulating with steady gait. 

## 2022-02-25 NOTE — ED Provider Notes (Signed)
[facility] Provider Note    Event Date/Time   First MD Initiated Contact with Patient 02/25/22 1950     (approximate)   History   Back Pain and Fatigue   HPI  Barry [name]. is a 56 y.o. male  with PMH rectal cancer  s/p APR (CRS) with V-Y closure on 11/18/2021. He was readmitted from 4/2-4/5 for a large pelvic fluid collection/abscess s/p IR drain placement on 4/3. Old JP site opened and packed and he was d/c'd on PO abx. He was readmitted from 5/22-5/28 for recurrent pelvic abscess s/p IR drain placement on 5/23 removed 3 weeks ago presents for evaluation of some upper back pain he states started today as well as some drainage from the sacral area that he states has been ongoing over the last couple days.  He describes it as yellow like.  He states his abdomen has been cramping as well more than baseline.  No fevers, cough, shortness of breath, vomiting, urinary symptoms or rash or other acute sick symptoms.      Physical Exam  Triage Vital Signs: ED Triage Vitals  Enc Vitals Group     BP 02/25/22 1830 122/89     Pulse Rate 02/25/22 1830 98     Resp 02/25/22 1830 18     Temp 02/25/22 1830 98.6 F (37 C)     Temp Source 02/25/22 1830 Oral     SpO2 02/25/22 1830 99 %     Weight 02/25/22 1831 153 lb (69.4 kg)     Height 02/25/22 1831 '5\' 8"'$  (1.727 m)     Head Circumference --      Peak Flow --      Pain Score 02/25/22 1830 9     Pain Loc --      Pain Edu? --      Excl. in [place]? --     Most recent vital signs: Vitals:   02/25/22 1830 02/25/22 2117  BP: 122/89 (!) 144/83  Pulse: 98 83  Resp: 18 18  Temp: 98.6 F (37 C) 98.9 F (37.2 C)  SpO2: 99% 100%    General: Awake, no distress.  CV:  Good peripheral perfusion.  2+ radial pulse Resp:  Normal effort.  Clear bilaterally. Abd:  No distention.  Tender with colostomy bag in place. Other:  Very small wound without significant erythema induration fluctuance on the sacral area and  otherwise well-appearing healing wounds.   ED Results / Procedures / Treatments  Labs (all labs ordered are listed, but only abnormal results are displayed) Labs Reviewed  BASIC METABOLIC PANEL - Abnormal; Notable for the following components:      Result Value   Glucose, Bld 109 (*)    All other components within normal limits  CBC - Abnormal; Notable for the following components:   Hemoglobin 11.4 (*)    HCT 34.9 (*)    RDW 19.6 (*)    All other components within normal limits  URINALYSIS, ROUTINE W REFLEX MICROSCOPIC - Abnormal; Notable for the following components:   Color, Urine YELLOW (*)    APPearance CLEAR (*)    All other components within normal limits  HEPATIC FUNCTION PANEL  CBG MONITORING, ED     EKG  ECG is remarkable for sinus rhythm with a ventricular rate of 94, normal axis with nonspecific ST change in V3, V4, and V5 without other clear evidence of acute ischemia or significant arrhythmia.  RADIOLOGY Chest reviewed by myself shows  no focal consoidation, effusion, edema, pneumothorax or other clear acute thoracic process. I also reviewed radiology interpretation and agree with findings described.  CTA chest as well as CT on pelvis my interpretation without evidence of pneumonia, PE, effusion, pneumothorax or other clear acute thoracic process.  My interpretation of the CT of the abdomen do not see evidence of perforation or some stranding around the terminal ileum.  No evidence of appendicitis.  I reviewed radiology interpretation and agree to findings of patient status post colostomy with decrease in size of rim-enhancing gas and fluid collection in the pelvic surgical bed compared to CT from May and stable enlarged right pelvic sidewall lymph nodes without other acute abdominal process other than some inflammation of the terminal ileal small bowel.  PROCEDURES:  Critical Care performed: No  Procedures    MEDICATIONS ORDERED IN ED: Medications   lidocaine-EPINEPHrine-tetracaine (LET) topical gel (3 mLs Topical Not Given 02/25/22 2110)  lactated ringers bolus 1,000 mL (1,000 mLs Intravenous New Bag/Given 02/25/22 2110)  morphine (PF) 4 MG/ML injection 4 mg (4 mg Intravenous Given 02/25/22 2110)  iohexol (OMNIPAQUE) 350 MG/ML injection 80 mL (80 mLs Intravenous Contrast Given 02/25/22 2049)     IMPRESSION / MDM / ASSESSMENT AND PLAN / ED COURSE  I reviewed the triage vital signs and the nursing notes. Patient's presentation is most consistent with acute presentation with potential threat to life or bodily function.                               Regarding patient's upper back pain differential diagnosis includes, but is not limited to PE, pneumonia, symptomatic effusion, ACS, MSK.  He describes it as achiness without any ripping or tearing and is hemodynamically stable without any chest pain and for lower suspicion for dissection at this time.  Regard to his reported sacral drainage differential considerations include recurrent abscess versus other deep space infection possible more superficial wound I am unable to see on the skin other than a very small wound.  There is no significant purulent drainage at the wound site itself at this time.  BMP shows no significant electrolyte or metabolic derangements.  CBC without leukocytosis and hemoglobin 11.4 compared to 9.74 weeks ago.  Hepatic function panel is unremarkable.  Urinalysis shows no evidence of infection.  CTA chest as well as CT on pelvis my interpretation without evidence of pneumonia, PE, effusion, pneumothorax or other clear acute thoracic process.  My interpretation of the CT of the abdomen do not see evidence of perforation or some stranding around the terminal ileum.  No evidence of appendicitis.  I reviewed radiology interpretation and agree to findings of patient status post colostomy with decrease in size of rim-enhancing gas and fluid collection in the pelvic surgical bed  compared to CT from May and stable enlarged right pelvic sidewall lymph nodes without other acute abdominal process other than some inflammation of the terminal ileal small bowel.  Overall I suspect patient's discomfort in his pelvic region may be from some mild colitis in the setting of improving abscess as noted on CT.  He is not septic and is very stable vitals and has no evidence of life-threatening etiology for his back discomfort.  He has no abnormal vital signs or leukocytosis or evidence of worsening sepsis and a CT which is improving.  No believe he requires further emergency evaluation or treatment I think is stable for discharge with continued outpatient evaluation  with his providers at [facility].  He will call them first in the morning.  Discharged in stable condition.  Strict return precautions advised and discussed.  Advised him to continue taking his Augmentin.      FINAL CLINICAL IMPRESSION(S) / ED DIAGNOSES   Final diagnoses:  Acute midline thoracic back pain  Colitis     Rx / DC Orders   ED Discharge Orders     None        Note:  This document was prepared using Dragon voice recognition software and may include unintentional dictation errors.   Lucrezia Starch, MD 02/25/22 2206

## 2022-02-26 ENCOUNTER — Inpatient Hospital Stay: Payer: Self-pay

## 2022-02-26 ENCOUNTER — Inpatient Hospital Stay: Payer: Self-pay | Admitting: Oncology

## 2022-02-26 ENCOUNTER — Telehealth: Payer: Self-pay | Admitting: Oncology

## 2022-02-26 ENCOUNTER — Encounter: Payer: Self-pay | Admitting: Oncology

## 2022-02-26 NOTE — Telephone Encounter (Signed)
Called pt to r/s appts, pt confirmed appts

## 2022-02-26 NOTE — Telephone Encounter (Signed)
Hello Mr. Barry [name],   Can you describe the pain you are having?

## 2022-02-28 ENCOUNTER — Other Ambulatory Visit: Payer: Self-pay

## 2022-02-28 DIAGNOSIS — C20 Malignant neoplasm of rectum: Secondary | ICD-10-CM

## 2022-03-03 ENCOUNTER — Telehealth: Payer: Self-pay

## 2022-03-03 ENCOUNTER — Inpatient Hospital Stay: Payer: Self-pay | Admitting: Oncology

## 2022-03-03 ENCOUNTER — Inpatient Hospital Stay: Payer: Self-pay

## 2022-03-03 ENCOUNTER — Encounter: Payer: Self-pay | Admitting: Oncology

## 2022-03-04 ENCOUNTER — Encounter: Payer: Self-pay | Admitting: Oncology

## 2022-03-05 ENCOUNTER — Encounter: Payer: Self-pay | Admitting: Oncology

## 2022-03-05 ENCOUNTER — Inpatient Hospital Stay (HOSPITAL_BASED_OUTPATIENT_CLINIC_OR_DEPARTMENT_OTHER): Payer: Self-pay | Admitting: Oncology

## 2022-03-05 ENCOUNTER — Other Ambulatory Visit: Payer: Self-pay

## 2022-03-05 ENCOUNTER — Inpatient Hospital Stay: Payer: Self-pay

## 2022-03-05 DIAGNOSIS — C20 Malignant neoplasm of rectum: Secondary | ICD-10-CM

## 2022-03-05 DIAGNOSIS — Z95828 Presence of other vascular implants and grafts: Secondary | ICD-10-CM | POA: Insufficient documentation

## 2022-03-05 DIAGNOSIS — R911 Solitary pulmonary nodule: Secondary | ICD-10-CM | POA: Insufficient documentation

## 2022-03-05 LAB — COMPREHENSIVE METABOLIC PANEL
ALT: 11 U/L (ref 0–44)
AST: 16 U/L (ref 15–41)
Albumin: 3.7 g/dL (ref 3.5–5.0)
Alkaline Phosphatase: 85 U/L (ref 38–126)
Anion gap: 5 (ref 5–15)
BUN: 10 mg/dL (ref 6–20)
CO2: 29 mmol/L (ref 22–32)
Calcium: 8.5 mg/dL — ABNORMAL LOW (ref 8.9–10.3)
Chloride: 103 mmol/L (ref 98–111)
Creatinine, Ser: 0.89 mg/dL (ref 0.61–1.24)
GFR, Estimated: >60 mL/min (ref >60)
Glucose, Bld: 102 mg/dL — ABNORMAL HIGH (ref 70–99)
Potassium: 3.8 mmol/L (ref 3.5–5.1)
Sodium: 137 mmol/L (ref 135–145)
Total Bilirubin: 0.2 mg/dL — ABNORMAL LOW (ref 0.3–1.2)
Total Protein: 8.4 g/dL — ABNORMAL HIGH (ref 6.5–8.1)

## 2022-03-05 LAB — CBC WITH DIFFERENTIAL/PLATELET
Abs Immature Granulocytes: 0.01 10*3/uL (ref 0.00–0.07)
Basophils Absolute: 0 10*3/uL (ref 0.0–0.1)
Basophils Relative: 0 %
Eosinophils Absolute: 0.3 10*3/uL (ref 0.0–0.5)
Eosinophils Relative: 6 %
HCT: 36.8 % — ABNORMAL LOW (ref 39.0–52.0)
Hemoglobin: 12 g/dL — ABNORMAL LOW (ref 13.0–17.0)
Immature Granulocytes: 0 %
Lymphocytes Relative: 25 %
Lymphs Abs: 1.2 10*3/uL (ref 0.7–4.0)
MCH: 26.9 pg (ref 26.0–34.0)
MCHC: 32.6 g/dL (ref 30.0–36.0)
MCV: 82.5 fL (ref 80.0–100.0)
Monocytes Absolute: 0.6 10*3/uL (ref 0.1–1.0)
Monocytes Relative: 12 %
Neutro Abs: 2.6 10*3/uL (ref 1.7–7.7)
Neutrophils Relative %: 57 %
Platelets: 289 10*3/uL (ref 150–400)
RBC: 4.46 MIL/uL (ref 4.22–5.81)
RDW: 20 % — ABNORMAL HIGH (ref 11.5–15.5)
WBC: 4.6 10*3/uL (ref 4.0–10.5)
nRBC: 0 % (ref 0.0–0.2)

## 2022-03-05 MED ORDER — NICOTINE 14 MG/24HR TD PT24
14.0000 mg | MEDICATED_PATCH | Freq: Every day | TRANSDERMAL | 2 refills | Status: DC
  Filled 2022-03-05: qty 28, 28d supply, fill #0
  Filled 2022-08-13 – 2022-09-03 (×2): qty 28, 28d supply, fill #1

## 2022-03-05 NOTE — Progress Notes (Signed)
Patient here for follow up. Reports he is feeling well.

## 2022-03-05 NOTE — Assessment & Plan Note (Signed)
Continue port flush every 8 weeks. 

## 2022-03-05 NOTE — Assessment & Plan Note (Signed)
locally advanced rectal cancer-Stage IIIB Status postTNT neoadjuvant protocol, concurrent Xeloda and radiation-Finished 08/27/2021.. Status post APR,Pathology showed rectal adenocarcinoma, [id number]. Positive radial margin due to performation. Continue active surveillance.  Repeat CT scan in 3 months.  Recent CT scan images were independent reviewed and discussed with patient.

## 2022-03-05 NOTE — Assessment & Plan Note (Addendum)
Previously discussed with IR, CT-guided biopsy is difficult due to the position of small size. 02/25/2022 CT angiogram chest PE protocol showed further decrease of left upper lobe irregular nodule, as well as the left perifissural nodule.  No new nodules.  Continue observation.  Repeat CT in 3 months

## 2022-03-05 NOTE — Progress Notes (Signed)
Hematology/Oncology Progress note [phone] [phone]      Patient Care Team: Pcp, No as PCP - General [name], RN as Oncology Nurse Navigator   ASSESSMENT & PLAN:   Rectal cancer [facility]) locally advanced rectal cancer-Stage IIIB Status postTNT neoadjuvant protocol, concurrent Xeloda and radiation-Finished 08/27/2021.. Status post APR,Pathology showed rectal adenocarcinoma, ypT3N0. Positive radial margin due to performation. Continue active surveillance.  Repeat CT scan in 3 months.  Recent CT scan images were independent reviewed and discussed with patient.  Lung nodule Previously discussed with IR, CT-guided biopsy is difficult due to the position of small size. 02/25/2022 CT angiogram chest PE protocol showed further decrease of left upper lobe irregular nodule, as well as the left perifissural nodule.  No new nodules.  Continue observation.  Repeat CT in 3 months  Port-A-Cath in place Continue port flush every 8 weeks. No orders of the defined types were placed in this encounter.  Follow-up in 3 months after repeat CT scan. All questions were answered. The patient knows to call the clinic with any problems, questions or concerns.  Earlie Server, MD, PhD [facility] Hematology Oncology 03/05/2022       CHIEF COMPLAINTS/REASON FOR VISIT:  Follow up for rectal cancer treatment  HISTORY OF PRESENTING ILLNESS:   Barry Buchheit. is a  56 y.o.  male presents for rectal cancer Oncology History  Rectal cancer ([place])  02/08/2021 Initial Diagnosis   Rectal cancer   02/05/2021-02/06/2021 patient was hospitalized due to generalized weakness, intermittent lightheadedness, weight loss and worsening constipation.  02/05/2021 CT abdomen showed concerning of severe rectal wall thickening and right internal iliac lymph node concerning for metastatic disease.  Patient was seen by gastroenterology and had colonoscopy which showed a circumferential fungating  mass in the rectum.  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   02/14/2021-02/16/2021 hospitalized due to rectal bleeding and pain.   02/14/2021 CT showed perirectal fluid collection/gas concerning for infection with significant leukocytosis, anemia with hemoglobin of 6.8.  Patient received PRBC transfusion, IV antibiotics.  He underwent an IR guided placement of JP drain into the rectal abscess.  Discharged home with oral Augmentin. 02/20/2021 presented to ER with new skin opening and draining from left gluteus.  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  JP drain has been removed.  Patient was continued on Augmentin.   02/13/2021, PET scan showed locally advanced rectal cancer with billowing of the mesorectum now with low attenuation material that was not present on previous examination with extensive stranding and inflammation.   Bulky RIGHT pelvic sidewall/hypogastric lymph node outside of the mesorectum with stippled calcification measuring 19 mm-no increased metabolic activity. Small LEFT hypogastric lymph node just peripheral to the internal external bifurcation-SUV 3.2 High RIGHT internal just below or at the internal/common iliac transition, lymph node -SUV 4.8 LEFT high hypogastric lymph node  8 mm-SUV 3. Scattered lymph nodes throughout the retroperitoneum with low FDG uptake Bilateral inguinal lymph nodes largest on the RIGHT (image 248/3) 11 mm with a maximum SUV of 2.8 spiculated nodule in the LEFT upper lobe- 9 x 8 mm       02/14/2021, CT abdomen pelvis without contrast Showed perforated rectal mass with contained perforation with fluid and gas extending above and below the pelvic floor,potentially involving the sphincter complex and extending into LEFT ischial rectal fossa   02/20/2021, CT pelvis with contrast showed Large perirectal/perianal abscess has markedly decreased in size since placement of the percutaneous drain. There are residual  gas-filled  collections in the soft tissues and suspect there is a fistula or sinus tract between the rectal mass and the subcutaneous tissues. Soft tissue gas along the medial left buttock and concern for a cutaneous ulceration in this area. Large rectal mass with evidence for a large necrotic right pelvic lymph node.   His case is complicated with a perirectal abscess. Prior to onset of perirectal abscess, on his 02/05/2021 scan, he was noted to have right internal iliac lymph node 3 x 1 x 2.3 cm which is concerning for nodal disease.On Subsequent images it was difficult to distinguish whether lymphadenopathy was due to nodal disease versus acute inflammation. 03/07/2021 MRI pelvis cT3 N2. Due to the possible contained perforation on previous CT, possible cT4 disease.    02/27/2021 medi port placed by Dr.Dew 03/13/2021 reports right butt cheek and also left perianal area fullness.he was seen by Dr.Pabon urgently  and had right buttock abscess drained. Left perianal fullness was felt to be due to cancer.    02/08/2021 Cancer Staging   Staging form: Colon and Rectum, AJCC 8th Edition - Clinical stage from 02/08/2021: Stage IIIB (cT4a, cN1, cM0) - Signed by Earlie Server, MD on 03/06/2021 Stage prefix: Initial diagnosis   02/27/2021 Procedure   medi port placed by Dr.Dew   03/18/2021 - 07/05/2021 Chemotherapy    FOLFOX q14d x 4 months      07/17/2021 - 08/27/2021 Chemotherapy   Xeloda concurrent with Radiation   11/18/2021 Surgery   patient is status post open APR with flap for rectal cancer. Pathology rectal adenomacarcinoma ypT3N0.  Positive margin: Radial (circumferential) or mesenteric: adjacent to perforation      02/25/2022 Imaging   CT chest angiogram with and without contrast, CT abdomen pelvis with and without contrast 1. Negative for acute pulmonary embolus.2. Emphysema. Further decrease in size of the previously noted irregular nodule in the left upper lobe which is now barely measurable today. No new  suspicious lung nodules 3. Status post left lower quadrant colostomy. Further decrease in size since comparison exam from May of the rim enhancing gas and fluid collection at the pelvic surgical bed extending from the perineum superiorly into the pelvis 4. Slightly thickened appearance of terminal ileal small bowel loops in the pelvis with mild stranding suggesting small bowel inflammatory process. 5. Stable enlarged right pelvic sidewall lymph node     Patient presented to emergency room on 12/08/21, rectal pain.  CT scan showed 18.3 cm fluid and gas collection in the pelvic surgical bed compatible with infected collection/abscess.  Patient was sent to [facility] and admitted for.  Patient has CT-guided drain placement on 12/09/2021.  Blood culture positive for Staph epidermidis which was felt to be contaminant.  Fluid culture grew pansensitive Staph aureus, strep pyogenes and candida albicans.  Patient was treated with IV antibiotics and transition to p.o. Augmentin and fluconazole on 12/11/2021.  #Perirectal abscess status post drainage catheter , patient was recently seen by [facility] oncology surgeon on 12/24/2021 and was recommended to keep drainage catheter and continue antibiotics. Patient had a repeat CT scan done which showed a smaller but persistent abscess.  He will return to [facility] surgery for follow-up of drain removal. His case was discussed on [name] tumor board for the positive mesenteric margin which was felt to be secondary to previous perforation.  Recommend surveillance.  INTERVAL HISTORY Barry Erny. is a 56 y.o. male who has above history reviewed by me today presents for follow up visit for management  of rectal cancer. 5/22-5/28 for recurrent pelvic abscess s/p IR drain placement on 5/23 (packing old drain site) it was seen by [name] surgery on 02/12/2022 and JP drain was removed..  Patient currently on a course of Augmentin. 02/25/2022, [facility] ED visit for upper back pain as well as drainage from  the sacral area.  Repeat CT chest PE protocol plus abdomen pelvis with contrast was done.  No finding for cancer recurrence.  No PE, decrease size of lung nodules.  Decrease in size of the pelvic fluid collection.  Thickened terminal ileum small bowel loops suggesting small bowel inflammation process.  Stable enlarged right pelvic sidewall lymph node.  Patient reports that his pain has largely improved.  He has 2 more days of Augmentin left.  No fever, chills, nausea vomiting diarrhea.  He wants a prescription of nicotine patch to help his smoke cessation after.  Review of Systems  Constitutional:  Negative for appetite change, chills, fatigue, fever and unexpected weight change.  HENT:   Negative for hearing loss and voice change.   Eyes:  Negative for eye problems and icterus.  Respiratory:  Negative for chest tightness, cough and shortness of breath.   Cardiovascular:  Negative for chest pain and leg swelling.  Gastrointestinal:  Negative for abdominal distention, abdominal pain, blood in stool, constipation, diarrhea and rectal pain.  Endocrine: Negative for hot flashes.  Genitourinary:  Negative for difficulty urinating, dysuria and frequency.   Musculoskeletal:  Negative for arthralgias.  Skin:  Negative for itching and rash.  Neurological:  Negative for light-headedness and numbness.  Hematological:  Negative for adenopathy. Does not bruise/bleed easily.  Psychiatric/Behavioral:  Negative for confusion.     MEDICAL HISTORY:  Past Medical History:  Diagnosis Date   Headache    Left shoulder pain    Rectal cancer ([place])     SURGICAL HISTORY: Past Surgical History:  Procedure Laterality Date   COLON SURGERY     COLONOSCOPY WITH PROPOFOL N/A 02/06/2021   Procedure: COLONOSCOPY WITH PROPOFOL;  Surgeon: Lin Landsman, MD;  Location: [facility] ENDOSCOPY;  Service: Gastroenterology;  Laterality: N/A;   PORTA CATH INSERTION N/A 02/27/2021   Procedure: PORTA CATH INSERTION;  Surgeon:  Algernon Huxley, MD;  Location: [place] CV LAB;  Service: Cardiovascular;  Laterality: N/A;   ROTATOR CUFF REPAIR Left     SOCIAL HISTORY: Social History   Socioeconomic History   Marital status: Married    Spouse name: Not on file   Number of children: Not on file   Years of education: Not on file   Highest education level: Not on file  Occupational History   Not on file  Tobacco Use   Smoking status: Former    Packs/day: 1.00    Years: 10.00    Total pack years: 10.00    Types: Cigars, Cigarettes    Quit date: 02/04/2021    Years since quitting: 1.0   Smokeless tobacco: Never  Substance and Sexual Activity   Alcohol use: Not Currently   Drug use: No   Sexual activity: Not on file  Other Topics Concern   Not on file  Social History Narrative   Not on file   Social Determinants of Health   Financial Resource Strain: Not on file  Food Insecurity: Not on file  Transportation Needs: Not on file  Physical Activity: Not on file  Stress: Not on file  Social Connections: Not on file  Intimate Partner Violence: Not on file    FAMILY  HISTORY: Family History  Problem Relation Age of Onset   Cancer Sister    Diabetes Mother    Cancer Maternal Grandmother    Cancer Paternal Grandmother     ALLERGIES:  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  MEDICATIONS:  Current Outpatient Medications  Medication Sig Dispense Refill   amoxicillin-clavulanate (AUGMENTIN) 875-125 MG tablet SMARTSIG:1 Tablet(s) By Mouth Every 12 Hours     feeding supplement (ENSURE ENLIVE / ENSURE PLUS) LIQD Take 237 mLs by mouth daily. 237 mL 12   nicotine (NICODERM CQ - DOSED IN MG/24 HOURS) 14 mg/24hr patch Place 1 patch (14 mg total) onto the skin daily. 28 patch 2   capecitabine (XELODA) 500 MG tablet Take 3 tablets (1,500 mg total) by mouth 2 (two) times daily after a meal. Take Monday-Friday. Take only on days of radiation. (Patient not taking: Reported on 10/07/2021) 12 tablet 0   fentaNYL  (DURAGESIC) 25 MCG/HR Place 1 patch onto the skin every 3 (three) days. (Patient not taking: Reported on 03/05/2022) 10 patch 0   loperamide (IMODIUM) 2 MG capsule Take 1 capsule (2 mg total) by mouth See admin instructions. Take 2 tablets with onset of diarrhea, then 1 tab after every loose bowel movement. Maximum dose 8 tablets within 24 hours. (Patient not taking: Reported on 08/23/2021) 90 capsule 0   senna-docusate (SENOKOT-S) 8.6-50 MG tablet Take 2 tablets by mouth daily. (Patient not taking: Reported on 01/15/2022) 120 tablet 0   No current facility-administered medications for this visit.     PHYSICAL EXAMINATION: ECOG PERFORMANCE STATUS: 1 - Symptomatic but completely ambulatory Vitals:   03/05/22 1041  BP: 140/85  Pulse: 91  Resp: 18  Temp: (!) 96.6 F (35.9 C)     Filed Weights   03/05/22 1041  Weight: 152 lb 3.2 oz (69 kg)     Physical Exam HENT:     Head: Normocephalic and atraumatic.  Eyes:     General: No scleral icterus. Cardiovascular:     Rate and Rhythm: Normal rate and regular rhythm.     Heart sounds: Normal heart sounds.  Pulmonary:     Effort: Pulmonary effort is normal. No respiratory distress.     Breath sounds: No wheezing.     Comments: Decreased breath sound bilaterally.  Abdominal:     General: Bowel sounds are normal. There is no distension.     Palpations: Abdomen is soft.     Comments: Colostomy bag  Genitourinary:    Comments: Status post APR, Musculoskeletal:        General: No deformity. Normal range of motion.     Cervical back: Normal range of motion and neck supple.  Skin:    General: Skin is warm and dry.     Findings: No erythema or rash.  Neurological:     Mental Status: He is alert and oriented to person, place, and time. Mental status is at baseline.     Cranial Nerves: No cranial nerve deficit.     Coordination: Coordination normal.  Psychiatric:        Mood and Affect: Mood normal.     LABORATORY DATA:  I have  reviewed the data as listed Lab Results  Component Value Date   WBC 4.6 03/05/2022   HGB 12.0 (L) 03/05/2022   HCT 36.8 (L) 03/05/2022   MCV 82.5 03/05/2022   PLT 289 03/05/2022   Recent Labs    01/27/22 1527 02/25/22 1834 03/05/22 1021  NA 137 140 137  K 3.3*  3.5 3.8  CL 103 107 103  CO2 '27 25 29  '$ GLUCOSE 87 109* 102*  BUN '8 7 10  '$ CREATININE 0.71 0.77 0.89  CALCIUM 8.5* 8.9 8.5*  GFRNONAA >60 >60 >60  PROT 8.0 7.7 8.4*  ALBUMIN 3.2* 3.5 3.7  AST 14* 15 16  ALT '11 13 11  '$ ALKPHOS 78 81 85  BILITOT 0.5 0.4 0.2*  BILIDIR  --  <0.1  --   IBILI  --  NOT CALCULATED  --     Iron/TIBC/Ferritin/ %Sat    Component Value Date/Time   IRON 28 (L) 03/25/2021 1029   TIBC 356 03/25/2021 1029   FERRITIN 118 03/25/2021 1029   IRONPCTSAT 8 (L) 03/25/2021 1029      RADIOGRAPHIC STUDIES: I have personally reviewed the radiological images as listed and agreed with the findings in the report. CT Angio Chest PE W and/or Wo Contrast  Result Date: 02/25/2022 CLINICAL DATA:  Sacral infection abdomen and back pain EXAM: CT ANGIOGRAPHY CHEST CT ABDOMEN AND PELVIS WITH CONTRAST TECHNIQUE: Multidetector CT imaging of the chest was performed using the standard protocol during bolus administration of intravenous contrast. Multiplanar CT image reconstructions and MIPs were obtained to evaluate the vascular anatomy. Multidetector CT imaging of the abdomen and pelvis was performed using the standard protocol during bolus administration of intravenous contrast. RADIATION DOSE REDUCTION: This exam was performed according to the departmental dose-optimization program which includes automated exposure control, adjustment of the mA and/or kV according to patient size and/or use of iterative reconstruction technique. CONTRAST:  78m OMNIPAQUE IOHEXOL 350 MG/ML SOLN COMPARISON:  Chest x-ray 02/25/2022, CT 01/27/2022, 12/08/2021, MRI 03/07/2021, chest CT 09/04/2021, PET CT 02/13/2021 FINDINGS: CTA CHEST FINDINGS  Cardiovascular: Satisfactory opacification of the pulmonary arteries to the segmental level. No evidence of pulmonary embolism. Normal heart size. No pericardial effusion. Nonaneurysmal aorta. No dissection seen. Mild atherosclerosis. Mediastinum/Nodes: Midline trachea. No thyroid mass. No suspicious lymph nodes. Esophagus within normal limits. Lungs/Pleura: Mild emphysema. No acute airspace disease. Further decrease in size of left upper lobe irregular nodule, measuring 2 mm on series 6, image 34, previously 4 mm. Small left Peri fissural nodules are unchanged, series 6, image 55. There is no new pulmonary lesion identified. Musculoskeletal: No acute osseous abnormality. Review of the MIP images confirms the above findings. CT ABDOMEN and PELVIS FINDINGS Hepatobiliary: No calcified gallstone. Stable inferior right hepatic exophytic cyst. No biliary dilatation Pancreas: Unremarkable. No pancreatic ductal dilatation or surrounding inflammatory changes. Spleen: Normal in size without focal abnormality. Adrenals/Urinary Tract: Adrenal glands are normal. Kidneys show no hydronephrosis. Subcentimeter hypodensities in the kidneys too small to further characterize. The bladder is grossly unremarkable. Stomach/Bowel: The stomach is nonenlarged. No dilated small bowel. History of abdominal perineal resection with left lower quadrant colostomy. Slightly thickened appearance of distal small bowel and terminal ileum in the pelvis, series 2 image 58 with mild stranding in the pelvic fat. Vascular/Lymphatic: Moderate aortic atherosclerosis. No aneurysm. Right pelvic sidewall lymph node measures 2.7 cm transverse, previously 2.7 cm. Reproductive: Negative for mass Other: No free air. Interval decrease in size of previously noted rim enhancing fluid collection in the perineal region that was noted on the previous exam with tiny residual collection measuring 1.7 by 0.8 cm, series 2 image 87, this is contiguous with rim enhancing gas  and fluid collection that extends into the pelvis which also appears slightly diminished in size as compared with the May exam. On sagittal images, the collection measures approximately 8.1 cm oblique craniocaudad  by 2.7 cm AP Musculoskeletal: No acute osseous abnormality. Review of the MIP images confirms the above findings. IMPRESSION: 1. Negative for acute pulmonary embolus. 2. Emphysema. Further decrease in size of the previously noted irregular nodule in the left upper lobe which is now barely measurable today. No new suspicious lung nodules 3. Status post left lower quadrant colostomy. Further decrease in size since comparison exam from May of the rim enhancing gas and fluid collection at the pelvic surgical bed extending from the perineum superiorly into the pelvis 4. Slightly thickened appearance of terminal ileal small bowel loops in the pelvis with mild stranding suggesting small bowel inflammatory process. 5. Stable enlarged right pelvic sidewall lymph node Electronically Signed   By: Donavan Foil M.D.   On: 02/25/2022 21:42   CT ABDOMEN PELVIS W CONTRAST  Result Date: 02/25/2022 CLINICAL DATA:  Sacral infection abdomen and back pain EXAM: CT ANGIOGRAPHY CHEST CT ABDOMEN AND PELVIS WITH CONTRAST TECHNIQUE: Multidetector CT imaging of the chest was performed using the standard protocol during bolus administration of intravenous contrast. Multiplanar CT image reconstructions and MIPs were obtained to evaluate the vascular anatomy. Multidetector CT imaging of the abdomen and pelvis was performed using the standard protocol during bolus administration of intravenous contrast. RADIATION DOSE REDUCTION: This exam was performed according to the departmental dose-optimization program which includes automated exposure control, adjustment of the mA and/or kV according to patient size and/or use of iterative reconstruction technique. CONTRAST:  47m OMNIPAQUE IOHEXOL 350 MG/ML SOLN COMPARISON:  Chest x-ray  02/25/2022, CT 01/27/2022, 12/08/2021, MRI 03/07/2021, chest CT 09/04/2021, PET CT 02/13/2021 FINDINGS: CTA CHEST FINDINGS Cardiovascular: Satisfactory opacification of the pulmonary arteries to the segmental level. No evidence of pulmonary embolism. Normal heart size. No pericardial effusion. Nonaneurysmal aorta. No dissection seen. Mild atherosclerosis. Mediastinum/Nodes: Midline trachea. No thyroid mass. No suspicious lymph nodes. Esophagus within normal limits. Lungs/Pleura: Mild emphysema. No acute airspace disease. Further decrease in size of left upper lobe irregular nodule, measuring 2 mm on series 6, image 34, previously 4 mm. Small left Peri fissural nodules are unchanged, series 6, image 55. There is no new pulmonary lesion identified. Musculoskeletal: No acute osseous abnormality. Review of the MIP images confirms the above findings. CT ABDOMEN and PELVIS FINDINGS Hepatobiliary: No calcified gallstone. Stable inferior right hepatic exophytic cyst. No biliary dilatation Pancreas: Unremarkable. No pancreatic ductal dilatation or surrounding inflammatory changes. Spleen: Normal in size without focal abnormality. Adrenals/Urinary Tract: Adrenal glands are normal. Kidneys show no hydronephrosis. Subcentimeter hypodensities in the kidneys too small to further characterize. The bladder is grossly unremarkable. Stomach/Bowel: The stomach is nonenlarged. No dilated small bowel. History of abdominal perineal resection with left lower quadrant colostomy. Slightly thickened appearance of distal small bowel and terminal ileum in the pelvis, series 2 image 58 with mild stranding in the pelvic fat. Vascular/Lymphatic: Moderate aortic atherosclerosis. No aneurysm. Right pelvic sidewall lymph node measures 2.7 cm transverse, previously 2.7 cm. Reproductive: Negative for mass Other: No free air. Interval decrease in size of previously noted rim enhancing fluid collection in the perineal region that was noted on the  previous exam with tiny residual collection measuring 1.7 by 0.8 cm, series 2 image 87, this is contiguous with rim enhancing gas and fluid collection that extends into the pelvis which also appears slightly diminished in size as compared with the May exam. On sagittal images, the collection measures approximately 8.1 cm oblique craniocaudad by 2.7 cm AP Musculoskeletal: No acute osseous abnormality. Review of the MIP images confirms  the above findings. IMPRESSION: 1. Negative for acute pulmonary embolus. 2. Emphysema. Further decrease in size of the previously noted irregular nodule in the left upper lobe which is now barely measurable today. No new suspicious lung nodules 3. Status post left lower quadrant colostomy. Further decrease in size since comparison exam from May of the rim enhancing gas and fluid collection at the pelvic surgical bed extending from the perineum superiorly into the pelvis 4. Slightly thickened appearance of terminal ileal small bowel loops in the pelvis with mild stranding suggesting small bowel inflammatory process. 5. Stable enlarged right pelvic sidewall lymph node Electronically Signed   By: Donavan Foil M.D.   On: 02/25/2022 21:42   DG Chest 2 View  Result Date: 02/25/2022 CLINICAL DATA:  Chest pain on inspiration EXAM: CHEST - 2 VIEW COMPARISON:  CT from 09/04/2021 FINDINGS: Cardiac shadow is within normal limits. Right chest wall port is seen. Lungs are clear bilaterally. Known left upper lobe nodule is not well appreciated on this exam. No acute bony abnormality is noted. IMPRESSION: No acute abnormality seen. Electronically Signed   By: Inez Catalina M.D.   On: 02/25/2022 19:11   CT ABDOMEN PELVIS W CONTRAST  Result Date: 01/27/2022 CLINICAL DATA:  Abdominal pain. Status post APR for rectal cancer with left lower quadrant colostomy. EXAM: CT ABDOMEN AND PELVIS WITH CONTRAST TECHNIQUE: Multidetector CT imaging of the abdomen and pelvis was performed using the standard  protocol following bolus administration of intravenous contrast. RADIATION DOSE REDUCTION: This exam was performed according to the departmental dose-optimization program which includes automated exposure control, adjustment of the mA and/or kV according to patient size and/or use of iterative reconstruction technique. CONTRAST:  63m OMNIPAQUE IOHEXOL 350 MG/ML SOLN COMPARISON:  12/08/2021 FINDINGS: Lower chest: No acute abnormality. Hepatobiliary: Small exophytic cyst arising off the inferior right lobe of liver measures 1.1 cm, image 34/2. Unchanged. Too small to characterize low-density structure within the lateral dome of right hepatic lobe is also unchanged, image 16/2. No suspicious liver lesions identified. Gallbladder appears normal. No bile duct dilatation. Pancreas: Unremarkable. No pancreatic ductal dilatation or surrounding inflammatory changes. Spleen: Normal in size without focal abnormality. Adrenals/Urinary Tract: Normal adrenal glands. No suspicious kidney mass, nephrolithiasis or hydronephrosis. Urinary bladder is unremarkable. Stomach/Bowel: Postop change from abdominoperineal resection with left lower quadrant colostomy. No signs of bowel wall thickening, inflammation or distension. Vascular/Lymphatic: Aortic atherosclerosis. No aneurysm. Enlarged right pelvic sidewall lymph node measures 2.7 cm, image 69/2. Previously 2.4 cm. Reproductive: No abnormality identified. Other: There is a peripherally enhancing fluid collection involving the perineum extending superiorly into the pelvis. On today's exam this measures 6.0 by 3.6 by 12.5 cm (volume = 140 cm^3), image 59/6 and image 87/2. On the previous exam this measured 6.1 x 6.6 x 18.3 cm (volume = 390 cm^3). Musculoskeletal: No acute or significant osseous findings. IMPRESSION: 1. There is a large peripherally enhancing fluid and gas collection involving the pelvic surgical bed extending from the perineum superiorly into the pelvis. Overall  decreased in size when compared with 12/08/2021. Cannot exclude infected postoperative fluid collection/abscess. 2. Status post left lower quadrant ostomy. No evidence for bowel obstruction. 3.  Aortic Atherosclerosis (ICD10-I70.0). Electronically Signed   By: TKerby MoorsM.D.   On: 01/27/2022 17:10   CT ABDOMEN PELVIS W CONTRAST  Result Date: 12/08/2021 CLINICAL DATA:  56year old male with abdominoperineal resection for rectal cancer on 11/18/2021. Now with induration warmth and some drainage from the wound. Patient with LEFT colostomy. EXAM:  CT ABDOMEN AND PELVIS WITH CONTRAST TECHNIQUE: Multidetector CT imaging of the abdomen and pelvis was performed using the standard protocol following bolus administration of intravenous contrast. RADIATION DOSE REDUCTION: This exam was performed according to the departmental dose-optimization program which includes automated exposure control, adjustment of the mA and/or kV according to patient size and/or use of iterative reconstruction technique. CONTRAST:  176m OMNIPAQUE IOHEXOL 300 MG/ML  SOLN COMPARISON:  02/14/2021 CT FINDINGS: Lower chest: No acute abnormality. No lung nodule or mass identified in the lung bases. Hepatobiliary: No interval change. A 4.5 mm probable cyst in the RIGHT liver is unchanged. The gallbladder is unremarkable. No biliary dilatation. Pancreas: Unremarkable Spleen: Unremarkable Adrenals/Urinary Tract: No significant renal abnormalities are noted. The adrenal glands and bladder are unremarkable. Stomach/Bowel: Surgical changes in the pelvis identified. LEFT LOWER quadrant ostomy noted. There is no evidence of bowel obstruction. An ill-defined fluid and gas collection is noted extending from the perineum superiorly into the pelvis and with smaller extensions of fluid into the UPPER LEFT pelvis. This collection measures 6.1 x 6.6 x 18.3 cm (transverse x AP x CC) and compatible with an infected collection/abscess. Vascular/Lymphatic: Aortic  atherosclerosis. A RIGHT pelvic sidewall lymph node is unchanged (series 2: Image 69). Reproductive: No definite abnormality Other: No evidence of pneumoperitoneum or ascites. Musculoskeletal: No acute or suspicious bony abnormalities are noted. IMPRESSION: 1. 6.1 x 6.6 x 18.3 cm ill-defined fluid and gas collection in the pelvic surgical bed extending from the perineum superiorly compatible with an infected collection/abscess. 2. LEFT LOWER quadrant ostomy.  No evidence of bowel obstruction. 3. Aortic Atherosclerosis (ICD10-I70.0). Electronically Signed   By: JMargarette CanadaM.D.   On: 12/08/2021 12:16

## 2022-03-08 ENCOUNTER — Encounter: Payer: Self-pay | Admitting: Oncology

## 2022-03-12 ENCOUNTER — Other Ambulatory Visit: Payer: Self-pay

## 2022-04-07 ENCOUNTER — Inpatient Hospital Stay: Payer: Self-pay

## 2022-04-08 ENCOUNTER — Inpatient Hospital Stay: Payer: Medicaid Other | Attending: Oncology

## 2022-04-08 DIAGNOSIS — C20 Malignant neoplasm of rectum: Secondary | ICD-10-CM | POA: Insufficient documentation

## 2022-04-08 DIAGNOSIS — Z95828 Presence of other vascular implants and grafts: Secondary | ICD-10-CM

## 2022-04-08 DIAGNOSIS — Z452 Encounter for adjustment and management of vascular access device: Secondary | ICD-10-CM | POA: Insufficient documentation

## 2022-04-08 MED ORDER — HEPARIN SOD (PORK) LOCK FLUSH 100 UNIT/ML IV SOLN
500.0000 [IU] | Freq: Once | INTRAVENOUS | Status: AC
  Administered 2022-04-08: 500 [IU] via INTRAVENOUS
  Filled 2022-04-08: qty 5

## 2022-04-08 MED ORDER — SODIUM CHLORIDE 0.9% FLUSH
10.0000 mL | Freq: Once | INTRAVENOUS | Status: AC
  Administered 2022-04-08: 10 mL via INTRAVENOUS
  Filled 2022-04-08: qty 10

## 2022-05-29 ENCOUNTER — Encounter: Payer: Self-pay | Admitting: Oncology

## 2022-06-02 ENCOUNTER — Ambulatory Visit
Admission: RE | Admit: 2022-06-02 | Discharge: 2022-06-02 | Disposition: A | Discharge status: Home / Self Care | Payer: Self-pay | Source: Ambulatory Visit | Attending: Oncology | Admitting: Oncology

## 2022-06-02 ENCOUNTER — Inpatient Hospital Stay: Payer: Self-pay | Attending: Oncology

## 2022-06-02 DIAGNOSIS — C20 Malignant neoplasm of rectum: Secondary | ICD-10-CM | POA: Insufficient documentation

## 2022-06-02 LAB — CBC WITH DIFFERENTIAL/PLATELET
Abs Immature Granulocytes: 0 10*3/uL (ref 0.00–0.07)
Basophils Absolute: 0 10*3/uL (ref 0.0–0.1)
Basophils Relative: 1 %
Eosinophils Absolute: 0.1 10*3/uL (ref 0.0–0.5)
Eosinophils Relative: 3 %
HCT: 41 % (ref 39.0–52.0)
Hemoglobin: 13.9 g/dL (ref 13.0–17.0)
Lymphocytes Relative: 35 %
Lymphs Abs: 1.7 10*3/uL (ref 0.7–4.0)
MCH: 27.9 pg (ref 26.0–34.0)
MCHC: 33.9 g/dL (ref 30.0–36.0)
MCV: 82.2 fL (ref 80.0–100.0)
Monocytes Absolute: 0.6 10*3/uL (ref 0.1–1.0)
Monocytes Relative: 13 %
Neutro Abs: 2.3 10*3/uL (ref 1.7–7.7)
Neutrophils Relative %: 48 %
Platelets: 223 10*3/uL (ref 150–400)
RBC: 4.99 MIL/uL (ref 4.22–5.81)
RDW: 17 % — ABNORMAL HIGH (ref 11.5–15.5)
Smear Review: ADEQUATE
WBC: 4.8 10*3/uL (ref 4.0–10.5)
nRBC: 0 % (ref 0.0–0.2)

## 2022-06-02 LAB — COMPREHENSIVE METABOLIC PANEL
ALT: 14 U/L (ref 0–44)
AST: 17 U/L (ref 15–41)
Albumin: 3.7 g/dL (ref 3.5–5.0)
Alkaline Phosphatase: 92 U/L (ref 38–126)
Anion gap: 4 — ABNORMAL LOW (ref 5–15)
BUN: 9 mg/dL (ref 6–20)
CO2: 27 mmol/L (ref 22–32)
Calcium: 8.4 mg/dL — ABNORMAL LOW (ref 8.9–10.3)
Chloride: 108 mmol/L (ref 98–111)
Creatinine, Ser: 0.9 mg/dL (ref 0.61–1.24)
GFR, Estimated: >60 mL/min (ref >60)
Glucose, Bld: 75 mg/dL (ref 70–99)
Potassium: 3.3 mmol/L — ABNORMAL LOW (ref 3.5–5.1)
Sodium: 139 mmol/L (ref 135–145)
Total Bilirubin: 0.2 mg/dL — ABNORMAL LOW (ref 0.3–1.2)
Total Protein: 7.9 g/dL (ref 6.5–8.1)

## 2022-06-02 MED ORDER — IOHEXOL 300 MG/ML  SOLN
100.0000 mL | Freq: Once | INTRAMUSCULAR | Status: AC | PRN
  Administered 2022-06-02: 100 mL via INTRAVENOUS

## 2022-06-04 ENCOUNTER — Inpatient Hospital Stay: Payer: Self-pay | Admitting: Oncology

## 2022-06-04 NOTE — Assessment & Plan Note (Deleted)
locally advanced rectal cancer-Stage IIIB Status postTNT neoadjuvant protocol, concurrent Xeloda and radiation-Finished 08/27/2021.. Status post APR,Pathology showed rectal adenocarcinoma, ypT3N0. Positive radial margin due to performation.\ Recent CT scan images were independent reviewed and discussed with patient. Continue active surveillance.  Repeat CT scan in 3 months.

## 2022-06-05 LAB — CEA: CEA: 1.2 ng/mL (ref 0.0–4.7)

## 2022-06-09 ENCOUNTER — Encounter: Payer: Self-pay | Admitting: Oncology

## 2022-06-09 ENCOUNTER — Other Ambulatory Visit: Payer: Self-pay

## 2022-06-09 ENCOUNTER — Inpatient Hospital Stay: Payer: 59 | Attending: Oncology | Admitting: Oncology

## 2022-06-09 DIAGNOSIS — Z95828 Presence of other vascular implants and grafts: Secondary | ICD-10-CM | POA: Diagnosis not present

## 2022-06-09 DIAGNOSIS — E876 Hypokalemia: Secondary | ICD-10-CM | POA: Diagnosis not present

## 2022-06-09 DIAGNOSIS — Z9221 Personal history of antineoplastic chemotherapy: Secondary | ICD-10-CM | POA: Insufficient documentation

## 2022-06-09 DIAGNOSIS — I2510 Atherosclerotic heart disease of native coronary artery without angina pectoris: Secondary | ICD-10-CM | POA: Insufficient documentation

## 2022-06-09 DIAGNOSIS — K612 Anorectal abscess: Secondary | ICD-10-CM | POA: Insufficient documentation

## 2022-06-09 DIAGNOSIS — J439 Emphysema, unspecified: Secondary | ICD-10-CM | POA: Insufficient documentation

## 2022-06-09 DIAGNOSIS — R911 Solitary pulmonary nodule: Secondary | ICD-10-CM | POA: Diagnosis not present

## 2022-06-09 DIAGNOSIS — Z933 Colostomy status: Secondary | ICD-10-CM | POA: Insufficient documentation

## 2022-06-09 DIAGNOSIS — I700 Atherosclerosis of aorta: Secondary | ICD-10-CM | POA: Insufficient documentation

## 2022-06-09 DIAGNOSIS — C20 Malignant neoplasm of rectum: Secondary | ICD-10-CM

## 2022-06-09 DIAGNOSIS — Z87891 Personal history of nicotine dependence: Secondary | ICD-10-CM | POA: Insufficient documentation

## 2022-06-09 DIAGNOSIS — Z79899 Other long term (current) drug therapy: Secondary | ICD-10-CM | POA: Insufficient documentation

## 2022-06-09 MED ORDER — POTASSIUM CHLORIDE CRYS ER 20 MEQ PO TBCR
20.0000 meq | EXTENDED_RELEASE_TABLET | Freq: Every day | ORAL | 0 refills | Status: DC
  Filled 2022-06-09: qty 3, 3d supply, fill #0

## 2022-06-09 MED ORDER — POTASSIUM CHLORIDE CRYS ER 20 MEQ PO TBCR: 20.0000 meq | EXTENDED_RELEASE_TABLET | Freq: Every day | ORAL | 0 refills | Status: DC

## 2022-06-09 MED ORDER — NICOTINE 7 MG/24HR TD PT24
7.0000 mg | MEDICATED_PATCH | Freq: Every day | TRANSDERMAL | 2 refills | Status: DC
  Filled 2022-06-09: qty 28, 28d supply, fill #0

## 2022-06-09 NOTE — Assessment & Plan Note (Addendum)
Previously discussed with IR, CT-guided biopsy is difficult due to the position of small size. Decreased and stable on recent CTs Continue observation.  Repeat CT in 3 months

## 2022-06-09 NOTE — Progress Notes (Signed)
Hematology/Oncology Progress note [phone] [phone]      Patient Care Team: Pcp, No as PCP - General [name], RN as Oncology Nurse Navigator   ASSESSMENT & PLAN:   Rectal cancer [facility]) locally advanced rectal cancer-Stage IIIB Status postTNT neoadjuvant protocol, concurrent Xeloda and radiation-Finished 08/27/2021.. Status post APR,Pathology showed rectal adenocarcinoma, ypT3N0. Positive radial margin due to performation. Continue active surveillance.  Repeat CT scan in 3 months.  Recent CT scan images were independent reviewed and discussed with patient.  Lung nodule Previously discussed with IR, CT-guided biopsy is difficult due to the position of small size. Decreased and stable on recent CTs Continue observation.  Repeat CT in 3 months  Port-A-Cath in place Continue port flush every 8 weeks.   Hypokalemia Recommend potassium supplementation   Former smoker Recommend to wean Nicotin patch to '7mg'$ /24 hours No orders of the defined types were placed in this encounter.  Follow-up in 3 months after repeat CT scan. All questions were answered. The patient knows to call the clinic with any problems, questions or concerns.  Earlie Server, MD, PhD [facility] Hematology Oncology 06/09/2022       CHIEF COMPLAINTS/REASON FOR VISIT:  Follow up for rectal cancer treatment  HISTORY OF PRESENTING ILLNESS:   Barry Edelen. is a  56 y.o.  male presents for rectal cancer Oncology History  Rectal cancer ([place])  02/08/2021 Initial Diagnosis   Rectal cancer   02/05/2021-02/06/2021 patient was hospitalized due to generalized weakness, intermittent lightheadedness, weight loss and worsening constipation.  02/05/2021 CT abdomen showed concerning of severe rectal wall thickening and right internal iliac lymph node concerning for metastatic disease.  Patient was seen by gastroenterology and had colonoscopy which showed a circumferential fungating mass  in the rectum.  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   02/14/2021-02/16/2021 hospitalized due to rectal bleeding and pain.   02/14/2021 CT showed perirectal fluid collection/gas concerning for infection with significant leukocytosis, anemia with hemoglobin of 6.8.  Patient received PRBC transfusion, IV antibiotics.  He underwent an IR guided placement of JP drain into the rectal abscess.  Discharged home with oral Augmentin. 02/20/2021 presented to ER with new skin opening and draining from left gluteus.  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  JP drain has been removed.  Patient was continued on Augmentin.   02/13/2021, PET scan showed locally advanced rectal cancer with billowing of the mesorectum now with low attenuation material that was not present on previous examination with extensive stranding and inflammation.   Bulky RIGHT pelvic sidewall/hypogastric lymph node outside of the mesorectum with stippled calcification measuring 19 mm-no increased metabolic activity. Small LEFT hypogastric lymph node just peripheral to the internal external bifurcation-SUV 3.2 High RIGHT internal just below or at the internal/common iliac transition, lymph node -SUV 4.8 LEFT high hypogastric lymph node  8 mm-SUV 3. Scattered lymph nodes throughout the retroperitoneum with low FDG uptake Bilateral inguinal lymph nodes largest on the RIGHT (image 248/3) 11 mm with a maximum SUV of 2.8 spiculated nodule in the LEFT upper lobe- 9 x 8 mm       02/14/2021, CT abdomen pelvis without contrast Showed perforated rectal mass with contained perforation with fluid and gas extending above and below the pelvic floor,potentially involving the sphincter complex and extending into LEFT ischial rectal fossa   02/20/2021, CT pelvis with contrast showed Large perirectal/perianal abscess has markedly decreased in size since placement of the percutaneous drain. There are residual gas-filled collections in  the soft tissues and suspect there is a fistula or sinus tract between the rectal mass and the subcutaneous tissues. Soft tissue gas along the medial left buttock and concern for a cutaneous ulceration in this area. Large rectal mass with evidence for a large necrotic right pelvic lymph node.   His case is complicated with a perirectal abscess. Prior to onset of perirectal abscess, on his 02/05/2021 scan, he was noted to have right internal iliac lymph node 3 x 1 x 2.3 cm which is concerning for nodal disease.On Subsequent images it was difficult to distinguish whether lymphadenopathy was due to nodal disease versus acute inflammation. 03/07/2021 MRI pelvis cT3 N2. Due to the possible contained perforation on previous CT, possible cT4 disease.    02/27/2021 medi port placed by Dr.Dew 03/13/2021 reports right butt cheek and also left perianal area fullness.he was seen by Dr.Pabon urgently  and had right buttock abscess drained. Left perianal fullness was felt to be due to cancer.    02/08/2021 Cancer Staging   Staging form: Colon and Rectum, AJCC 8th Edition - Clinical stage from 02/08/2021: Stage IIIB (cT4a, cN1, cM0) - Signed by Earlie Server, MD on 03/06/2021 Stage prefix: Initial diagnosis   02/27/2021 Procedure   medi port placed by Dr.Dew   03/18/2021 - 07/05/2021 Chemotherapy    FOLFOX q14d x 4 months      07/17/2021 - 08/27/2021 Chemotherapy   Xeloda concurrent with Radiation   11/18/2021 Surgery   patient is status post open APR with flap for rectal cancer. Pathology rectal adenomacarcinoma ypT3N0.  Positive margin: Radial (circumferential) or mesenteric: adjacent to perforation      02/25/2022 Imaging   CT chest angiogram with and without contrast, CT abdomen pelvis with and without contrast 1. Negative for acute pulmonary embolus.2. Emphysema. Further decrease in size of the previously noted irregular nodule in the left upper lobe which is now barely measurable today. No new suspicious lung  nodules 3. Status post left lower quadrant colostomy. Further decrease in size since comparison exam from May of the rim enhancing gas and fluid collection at the pelvic surgical bed extending from the perineum superiorly into the pelvis 4. Slightly thickened appearance of terminal ileal small bowel loops in the pelvis with mild stranding suggesting small bowel inflammatory process. 5. Stable enlarged right pelvic sidewall lymph node   06/02/2022 Imaging   CT chest abdomen pelvis w contrast 1. Status post abdominal perineal resection with descending colostomy. 2. At the site of pelvic fluid and gas collection on 02/25/2022, there is residual, decreased presacral soft tissue fullness, without drainable collection. 3. Similar right obturator nodal metastasis.4.  No acute process or evidence of metastatic disease in the chest. 5. Age advanced coronary artery atherosclerosis. Recommend assessment of coronary risk factors. 6. Aortic atherosclerosis and emphysema       Patient presented to emergency room on 12/08/21, rectal pain.  CT scan showed 18.3 cm fluid and gas collection in the pelvic surgical bed compatible with infected collection/abscess.  Patient was sent to [facility] and admitted for.  Patient has CT-guided drain placement on 12/09/2021.  Blood culture positive for Staph epidermidis which was felt to be contaminant.  Fluid culture grew pansensitive Staph aureus, strep pyogenes and candida albicans.  Patient was treated with IV antibiotics and transition to p.o. Augmentin and fluconazole on 12/11/2021.  #Perirectal abscess status post drainage catheter , patient was recently seen by [facility] oncology surgeon on 12/24/2021 and was recommended to keep drainage catheter and continue antibiotics. Patient  had a repeat CT scan done which showed a smaller but persistent abscess.  He will return to [facility] surgery for follow-up of drain removal. His case was discussed on [name] tumor board for the positive mesenteric  margin which was felt to be secondary to previous perforation.  Recommend surveillance.  INTERVAL HISTORY Barry Shake. is a 55 y.o. male who has above history reviewed by me today presents for follow up visit for management of rectal cancer. He reports feeling well. He has quitted smoking, he is on nicotin patches and requests refills.  Denies any abdominal pain, weight loss, blood in stool.   Review of Systems  Constitutional:  Negative for appetite change, chills, fatigue, fever and unexpected weight change.  HENT:   Negative for hearing loss and voice change.   Eyes:  Negative for eye problems and icterus.  Respiratory:  Negative for chest tightness, cough and shortness of breath.   Cardiovascular:  Negative for chest pain and leg swelling.  Gastrointestinal:  Negative for abdominal distention, abdominal pain, blood in stool, constipation, diarrhea and rectal pain.  Endocrine: Negative for hot flashes.  Genitourinary:  Negative for difficulty urinating, dysuria and frequency.   Musculoskeletal:  Negative for arthralgias.  Skin:  Negative for itching and rash.  Neurological:  Negative for light-headedness and numbness.  Hematological:  Negative for adenopathy. Does not bruise/bleed easily.  Psychiatric/Behavioral:  Negative for confusion.     MEDICAL HISTORY:  Past Medical History:  Diagnosis Date   Headache    Left shoulder pain    Rectal cancer ([employer])     SURGICAL HISTORY: Past Surgical History:  Procedure Laterality Date   COLON SURGERY     COLONOSCOPY WITH PROPOFOL N/A 02/06/2021   Procedure: COLONOSCOPY WITH PROPOFOL;  Surgeon: Lin Landsman, MD;  Location: [facility] ENDOSCOPY;  Service: Gastroenterology;  Laterality: N/A;   PORTA CATH INSERTION N/A 02/27/2021   Procedure: PORTA CATH INSERTION;  Surgeon: Algernon Huxley, MD;  Location: [facility];  Service: Cardiovascular;  Laterality: N/A;   ROTATOR CUFF REPAIR Left     SOCIAL HISTORY: Social History    Socioeconomic History   Marital status: Married    Spouse name: Not on file   Number of children: Not on file   Years of education: Not on file   Highest education level: Not on file  Occupational History   Not on file  Tobacco Use   Smoking status: Former    Packs/day: 1.00    Years: 10.00    Total pack years: 10.00    Types: Cigars, Cigarettes    Quit date: 02/04/2021    Years since quitting: 1.3   Smokeless tobacco: Never  Substance and Sexual Activity   Alcohol use: Not Currently   Drug use: No   Sexual activity: Not on file  Other Topics Concern   Not on file  Social History Narrative   Not on file   Social Determinants of Health   Financial Resource Strain: Not on file  Food Insecurity: Not on file  Transportation Needs: Not on file  Physical Activity: Not on file  Stress: Not on file  Social Connections: Not on file  Intimate Partner Violence: Not on file    FAMILY HISTORY: Family History  Problem Relation Age of Onset   Cancer Sister    Diabetes Mother    Cancer Maternal Grandmother    Cancer Paternal Grandmother     ALLERGIES:  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  MEDICATIONS:  Current Outpatient Medications  Medication Sig Dispense Refill   feeding supplement (ENSURE ENLIVE / ENSURE PLUS) LIQD Take 237 mLs by mouth daily. 237 mL 12   nicotine (NICODERM CQ - DOSED IN MG/24 HOURS) 14 mg/24hr patch Place 1 patch (14 mg total) onto the skin daily. 28 patch 2   nicotine (NICODERM CQ - DOSED IN MG/24 HR) 7 mg/24hr patch Place 1 patch (7 mg total) onto the skin daily. [employer] fund to pay 28 patch 2   potassium chloride SA (KLOR-CON M) 20 MEQ tablet Take 1 tablet (20 mEq total) by mouth daily. [employer] fund to pay 3 tablet 0   No current facility-administered medications for this visit.     PHYSICAL EXAMINATION: ECOG PERFORMANCE STATUS: 1 - Symptomatic but completely ambulatory Vitals:   06/09/22 0836  BP: 131/86  Pulse: 88  Resp: 16  Temp: (!) 96.6 F  (35.9 C)  SpO2: 100%     Filed Weights   06/09/22 0836  Weight: 156 lb (70.8 kg)     Physical Exam HENT:     Head: Normocephalic and atraumatic.  Eyes:     General: No scleral icterus. Cardiovascular:     Rate and Rhythm: Normal rate and regular rhythm.     Heart sounds: Normal heart sounds.  Pulmonary:     Effort: Pulmonary effort is normal. No respiratory distress.     Breath sounds: No wheezing.     Comments: Decreased breath sound bilaterally.  Abdominal:     General: Bowel sounds are normal. There is no distension.     Palpations: Abdomen is soft.     Comments: Colostomy bag  Genitourinary:    Comments: Status post APR, Musculoskeletal:        General: No deformity. Normal range of motion.     Cervical back: Normal range of motion and neck supple.  Skin:    General: Skin is warm and dry.     Findings: No erythema or rash.  Neurological:     Mental Status: He is alert and oriented to person, place, and time. Mental status is at baseline.     Cranial Nerves: No cranial nerve deficit.     Coordination: Coordination normal.  Psychiatric:        Mood and Affect: Mood normal.     LABORATORY DATA:  I have reviewed the data as listed    Latest Ref Rng & Units 06/02/2022   10:49 AM 03/05/2022   10:21 AM 02/25/2022    6:34 PM  CBC  WBC 4.0 - 10.5 K/uL 4.8  4.6  6.6   Hemoglobin 13.0 - 17.0 g/dL 13.9  12.0  11.4   Hematocrit 39.0 - 52.0 % 41.0  36.8  34.9   Platelets 150 - 400 K/uL 223  289  302       Latest Ref Rng & Units 06/02/2022   10:49 AM 03/05/2022   10:21 AM 02/25/2022    6:34 PM  CMP  Glucose 70 - 99 mg/dL 75  102  109   BUN 6 - 20 mg/dL '9  10  7   '$ Creatinine 0.61 - 1.24 mg/dL 0.90  0.89  0.77   Sodium 135 - 145 mmol/L 139  137  140   Potassium 3.5 - 5.1 mmol/L 3.3  3.8  3.5   Chloride 98 - 111 mmol/L 108  103  107   CO2 22 - 32 mmol/L '27  29  25   '$ Calcium 8.9 - 10.3 mg/dL 8.4  8.5  8.9   Total Protein 6.5 - 8.1 g/dL 7.9  8.4  7.7  C  Total  Bilirubin 0.3 - 1.2 mg/dL 0.2  0.2  0.4  C  Alkaline Phos 38 - 126 U/L 92  85  81  C  AST 15 - 41 U/L '17  16  15  '$ C  ALT 0 - 44 U/L '14  11  13  '$ C    C Corrected result     RADIOGRAPHIC STUDIES: I have personally reviewed the radiological images as listed and agreed with the findings in the report. CT CHEST ABDOMEN PELVIS W CONTRAST  Result Date: 06/02/2022 CLINICAL DATA:  Locally advanced rectal cancer, stage IIIB, status post neoadjuvant radiation therapy and immunotherapy. * Tracking Code: BO * EXAM: CT CHEST, ABDOMEN, AND PELVIS WITH CONTRAST TECHNIQUE: Multidetector CT imaging of the chest, abdomen and pelvis was performed following the standard protocol during bolus administration of intravenous contrast. RADIATION DOSE REDUCTION: This exam was performed according to the departmental dose-optimization program which includes automated exposure control, adjustment of the mA and/or kV according to patient size and/or use of iterative reconstruction technique. CONTRAST:  171m OMNIPAQUE IOHEXOL 300 MG/ML  SOLN COMPARISON:  02/25/2022 FINDINGS: CT CHEST FINDINGS Cardiovascular: Right Port-A-Cath tip superior caval/atrial junction. Aortic atherosclerosis. Normal heart size, without pericardial effusion. No central pulmonary embolism, on this non-dedicated study. Right coronary artery calcification. Mediastinum/Nodes: No supraclavicular adenopathy. No mediastinal or hilar adenopathy. Lungs/Pleura: No pleural fluid. Mild centrilobular emphysema. Subpleural lymph node along the left major fissure on 94/7. Musculoskeletal: No acute osseous abnormality. CT ABDOMEN PELVIS FINDINGS Hepatobiliary: Well-circumscribed low-density liver lesions of maximally 1.2 cm are similar, primarily too small to characterize, but likely cysts. No suspicious liver lesion. Normal gallbladder, without biliary ductal dilatation. Pancreas: Normal, without mass or ductal dilatation. Spleen: Normal in size, without focal abnormality.  Adrenals/Urinary Tract: Normal adrenal glands. Bilateral too small to characterize renal lesions . In the absence of clinically indicated signs/symptoms require(s) no independent follow-up. No hydronephrosis. The bladder is in contact with the presacral soft tissue thickening. Stomach/Bowel: Normal stomach, without wall thickening. Status post descending colostomy and abdominal perineal resection. Normal terminal ileum and appendix. At the site of fluid and gas collection on 02/25/2022, there is presacral soft tissue thickening including at 3.3 by 3.3 cm on 115/2. Compare 3.5 x 3.5 cm gas and fluid collection on 02/25/2022. Vascular/Lymphatic: Aortic atherosclerosis. Right obturator node measures 3.0 x 2.7 cm on 113/2 and is similar to on the prior exam (when remeasured). No new adenopathy identified. Reproductive: Prostate poorly evaluated secondary to anatomic distortion from prior therapy. No focal abnormality identified. Other: No significant free fluid.  No free intraperitoneal air. Musculoskeletal: No acute osseous abnormality. IMPRESSION: 1. Status post abdominal perineal resection with descending colostomy. 2. At the site of pelvic fluid and gas collection on 02/25/2022, there is residual, decreased presacral soft tissue fullness, without drainable collection. 3. Similar right obturator nodal metastasis. 4.  No acute process or evidence of metastatic disease in the chest. 5. Age advanced coronary artery atherosclerosis. Recommend assessment of coronary risk factors. 6. Aortic atherosclerosis (ICD10-I70.0) and emphysema (ICD10-J43.9). Electronically Signed   By: KAbigail MiyamotoM.D.   On: 06/02/2022 15:51

## 2022-06-09 NOTE — Assessment & Plan Note (Signed)
Continue port flush every 8 weeks. 

## 2022-06-09 NOTE — Assessment & Plan Note (Signed)
Recommend potassium supplementation

## 2022-06-09 NOTE — Assessment & Plan Note (Signed)
locally advanced rectal cancer-Stage IIIB Status postTNT neoadjuvant protocol, concurrent Xeloda and radiation-Finished 08/27/2021.. Status post APR,Pathology showed rectal adenocarcinoma, [id number]. Positive radial margin due to performation. Continue active surveillance.  Repeat CT scan in 3 months.  Recent CT scan images were independent reviewed and discussed with patient.

## 2022-06-09 NOTE — Assessment & Plan Note (Signed)
Recommend to wean Nicotin patch to '7mg'$ /24 hours

## 2022-06-10 ENCOUNTER — Other Ambulatory Visit: Payer: Self-pay

## 2022-06-11 ENCOUNTER — Other Ambulatory Visit: Payer: Self-pay

## 2022-06-25 ENCOUNTER — Other Ambulatory Visit: Payer: Self-pay

## 2022-06-25 ENCOUNTER — Emergency Department
Admission: EM | Admit: 2022-06-25 | Discharge: 2022-06-25 | Disposition: A | Discharge status: Home / Self Care | Payer: 59 | Attending: Emergency Medicine | Admitting: Emergency Medicine

## 2022-06-25 DIAGNOSIS — K6289 Other specified diseases of anus and rectum: Secondary | ICD-10-CM | POA: Diagnosis not present

## 2022-06-25 DIAGNOSIS — Z859 Personal history of malignant neoplasm, unspecified: Secondary | ICD-10-CM | POA: Insufficient documentation

## 2022-06-25 DIAGNOSIS — Z5321 Procedure and treatment not carried out due to patient leaving prior to being seen by health care provider: Secondary | ICD-10-CM | POA: Diagnosis not present

## 2022-06-25 LAB — COMPREHENSIVE METABOLIC PANEL
ALT: 11 U/L (ref 0–44)
AST: 18 U/L (ref 15–41)
Albumin: 3.4 g/dL — ABNORMAL LOW (ref 3.5–5.0)
Alkaline Phosphatase: 89 U/L (ref 38–126)
Anion gap: 6 (ref 5–15)
BUN: 7 mg/dL (ref 6–20)
CO2: 26 mmol/L (ref 22–32)
Calcium: 8.6 mg/dL — ABNORMAL LOW (ref 8.9–10.3)
Chloride: 105 mmol/L (ref 98–111)
Creatinine, Ser: 0.99 mg/dL (ref 0.61–1.24)
GFR, Estimated: >60 mL/min (ref >60)
Glucose, Bld: 177 mg/dL — ABNORMAL HIGH (ref 70–99)
Potassium: 3.3 mmol/L — ABNORMAL LOW (ref 3.5–5.1)
Sodium: 137 mmol/L (ref 135–145)
Total Bilirubin: 0.6 mg/dL (ref 0.3–1.2)
Total Protein: 7.9 g/dL (ref 6.5–8.1)

## 2022-06-25 LAB — CBC
HCT: 40.3 % (ref 39.0–52.0)
Hemoglobin: 13.5 g/dL (ref 13.0–17.0)
MCH: 27.8 pg (ref 26.0–34.0)
MCHC: 33.5 g/dL (ref 30.0–36.0)
MCV: 83.1 fL (ref 80.0–100.0)
Platelets: 211 10*3/uL (ref 150–400)
RBC: 4.85 MIL/uL (ref 4.22–5.81)
RDW: 16.6 % — ABNORMAL HIGH (ref 11.5–15.5)
WBC: 11.9 10*3/uL — ABNORMAL HIGH (ref 4.0–10.5)
nRBC: 0 % (ref 0.0–0.2)

## 2022-06-25 LAB — LIPASE, BLOOD: Lipase: 23 U/L (ref 11–51)

## 2022-06-25 NOTE — ED Triage Notes (Signed)
Pt comes with c/o rectal swelling and pain. Pt states this started last night. Pt states hx of cancer and they had to sew up his bottom.   Pt states recently got over a pelvis infection. Pt was prescribed meds and finished course.

## 2022-06-26 ENCOUNTER — Emergency Department: Payer: 59

## 2022-06-26 ENCOUNTER — Encounter: Payer: Self-pay | Admitting: Emergency Medicine

## 2022-06-26 ENCOUNTER — Encounter: Payer: Self-pay | Admitting: Oncology

## 2022-06-26 ENCOUNTER — Emergency Department
Admission: EM | Admit: 2022-06-26 | Discharge: 2022-06-26 | Disposition: A | Discharge status: Other Acute Inpatient Hospital | Payer: 59 | Attending: Emergency Medicine | Admitting: Emergency Medicine

## 2022-06-26 DIAGNOSIS — Z85048 Personal history of other malignant neoplasm of rectum, rectosigmoid junction, and anus: Secondary | ICD-10-CM | POA: Insufficient documentation

## 2022-06-26 DIAGNOSIS — R319 Hematuria, unspecified: Secondary | ICD-10-CM | POA: Diagnosis not present

## 2022-06-26 DIAGNOSIS — R059 Cough, unspecified: Secondary | ICD-10-CM | POA: Diagnosis not present

## 2022-06-26 DIAGNOSIS — K611 Rectal abscess: Secondary | ICD-10-CM | POA: Insufficient documentation

## 2022-06-26 DIAGNOSIS — Z1152 Encounter for screening for COVID-19: Secondary | ICD-10-CM | POA: Diagnosis not present

## 2022-06-26 DIAGNOSIS — E876 Hypokalemia: Secondary | ICD-10-CM | POA: Insufficient documentation

## 2022-06-26 DIAGNOSIS — C20 Malignant neoplasm of rectum: Secondary | ICD-10-CM | POA: Diagnosis not present

## 2022-06-26 DIAGNOSIS — R69 Illness, unspecified: Secondary | ICD-10-CM | POA: Diagnosis not present

## 2022-06-26 DIAGNOSIS — K6289 Other specified diseases of anus and rectum: Secondary | ICD-10-CM | POA: Diagnosis not present

## 2022-06-26 DIAGNOSIS — N3289 Other specified disorders of bladder: Secondary | ICD-10-CM | POA: Diagnosis not present

## 2022-06-26 LAB — RESP PANEL BY RT-PCR (FLU A&B, COVID) ARPGX2
Influenza A by PCR: NEGATIVE
Influenza B by PCR: NEGATIVE
SARS Coronavirus 2 by RT PCR: NEGATIVE

## 2022-06-26 LAB — URINALYSIS, ROUTINE W REFLEX MICROSCOPIC
Bacteria, UA: NONE SEEN
Bilirubin Urine: NEGATIVE
Glucose, UA: NEGATIVE mg/dL
Hgb urine dipstick: NEGATIVE
Ketones, ur: NEGATIVE mg/dL
Leukocytes,Ua: NEGATIVE
Nitrite: NEGATIVE
Protein, ur: 30 mg/dL — AB
Specific Gravity, Urine: 1.015 (ref 1.005–1.030)
Squamous Epithelial / HPF: NONE SEEN (ref 0–5)
pH: 7 (ref 5.0–8.0)

## 2022-06-26 LAB — COMPREHENSIVE METABOLIC PANEL
ALT: 10 U/L (ref 0–44)
AST: 14 U/L — ABNORMAL LOW (ref 15–41)
Albumin: 3.2 g/dL — ABNORMAL LOW (ref 3.5–5.0)
Alkaline Phosphatase: 96 U/L (ref 38–126)
Anion gap: 4 — ABNORMAL LOW (ref 5–15)
BUN: 8 mg/dL (ref 6–20)
CO2: 28 mmol/L (ref 22–32)
Calcium: 8.4 mg/dL — ABNORMAL LOW (ref 8.9–10.3)
Chloride: 108 mmol/L (ref 98–111)
Creatinine, Ser: 0.93 mg/dL (ref 0.61–1.24)
GFR, Estimated: >60 mL/min (ref >60)
Glucose, Bld: 94 mg/dL (ref 70–99)
Potassium: 3.4 mmol/L — ABNORMAL LOW (ref 3.5–5.1)
Sodium: 140 mmol/L (ref 135–145)
Total Bilirubin: 0.9 mg/dL (ref 0.3–1.2)
Total Protein: 7.8 g/dL (ref 6.5–8.1)

## 2022-06-26 LAB — CBC WITH DIFFERENTIAL/PLATELET
Abs Immature Granulocytes: 0.04 10*3/uL (ref 0.00–0.07)
Basophils Absolute: 0 10*3/uL (ref 0.0–0.1)
Basophils Relative: 0 %
Eosinophils Absolute: 0.1 10*3/uL (ref 0.0–0.5)
Eosinophils Relative: 1 %
HCT: 38.7 % — ABNORMAL LOW (ref 39.0–52.0)
Hemoglobin: 13 g/dL (ref 13.0–17.0)
Immature Granulocytes: 0 %
Lymphocytes Relative: 10 %
Lymphs Abs: 1.1 10*3/uL (ref 0.7–4.0)
MCH: 28 pg (ref 26.0–34.0)
MCHC: 33.6 g/dL (ref 30.0–36.0)
MCV: 83.4 fL (ref 80.0–100.0)
Monocytes Absolute: 1.4 10*3/uL — ABNORMAL HIGH (ref 0.1–1.0)
Monocytes Relative: 13 %
Neutro Abs: 8.7 10*3/uL — ABNORMAL HIGH (ref 1.7–7.7)
Neutrophils Relative %: 76 %
Platelets: 208 10*3/uL (ref 150–400)
RBC: 4.64 MIL/uL (ref 4.22–5.81)
RDW: 16.2 % — ABNORMAL HIGH (ref 11.5–15.5)
Smear Review: NORMAL
WBC: 11.4 10*3/uL — ABNORMAL HIGH (ref 4.0–10.5)
nRBC: 0 % (ref 0.0–0.2)

## 2022-06-26 LAB — LACTIC ACID, PLASMA: Lactic Acid, Venous: 0.9 mmol/L (ref 0.5–1.9)

## 2022-06-26 MED ORDER — SODIUM CHLORIDE 0.9 % IV BOLUS
1000.0000 mL | Freq: Once | INTRAVENOUS | Status: AC
  Administered 2022-06-26: 1000 mL via INTRAVENOUS

## 2022-06-26 MED ORDER — IOHEXOL 300 MG/ML  SOLN
100.0000 mL | Freq: Once | INTRAMUSCULAR | Status: AC | PRN
  Administered 2022-06-26: 100 mL via INTRAVENOUS

## 2022-06-26 MED ORDER — PIPERACILLIN-TAZOBACTAM 3.375 G IVPB 30 MIN
3.3750 g | Freq: Once | INTRAVENOUS | Status: AC
  Administered 2022-06-26: 3.375 g via INTRAVENOUS
  Filled 2022-06-26: qty 50

## 2022-06-26 MED ORDER — HYDROMORPHONE HCL 1 MG/ML IJ SOLN
0.5000 mg | Freq: Once | INTRAMUSCULAR | Status: AC
  Administered 2022-06-26: 0.5 mg via INTRAVENOUS
  Filled 2022-06-26: qty 0.5

## 2022-06-26 MED ORDER — NICOTINE 14 MG/24HR TD PT24
14.0000 mg | MEDICATED_PATCH | Freq: Once | TRANSDERMAL | Status: DC
  Administered 2022-06-26: 14 mg via TRANSDERMAL
  Filled 2022-06-26: qty 1

## 2022-06-26 MED ORDER — ONDANSETRON HCL 4 MG/2ML IJ SOLN
4.0000 mg | Freq: Once | INTRAMUSCULAR | Status: AC
  Administered 2022-06-26: 4 mg via INTRAVENOUS
  Filled 2022-06-26: qty 2

## 2022-06-26 NOTE — ED Provider Notes (Addendum)
[facility] Provider Note    Event Date/Time   First MD Initiated Contact with Patient 06/26/22 1229     (approximate)   History   Rectal Pain   HPI  Barry [name]. is a 56 y.o. male with history of rectal cancer s/p APR (CRS) with V-Y closure on 11/18/2021. He was readmitted from 4/2-4/5 for a large pelvic fluid collection/abscess s/p IR drain placement on 4/3. Old JP site opened and packed and he was d/c'd on PO abx. He was readmitted from 5/22-5/28 for recurrent pelvic abscess s/p IR drain placement on 5/23 who was seen back in June for some back pain and had stable CT imaging at that time and was continued on his Augmentin who came in yesterday left without being seen but presents today due to rectal pain.  Pt comes in due to concern for infection and needing to have the fluid drain gained due to rectal pain. He reports having this twice before. He does report a cough as well.  He denies any chest pain or shortness of breath.  No Fevers. Reports pain has been there a couple of days now.   Patient reports that he is cured from cancer.  He is no longer on chemotherapy.  They were able to get it all after having the rectal surgery.   Physical Exam   Triage Vital Signs: ED Triage Vitals  Enc Vitals Group     BP 06/26/22 1055 123/78     Pulse Rate 06/26/22 1055 95     Resp 06/26/22 1055 16     Temp 06/26/22 1055 98.9 F (37.2 C)     Temp Source 06/26/22 1055 Oral     SpO2 06/26/22 1055 99 %     Weight 06/26/22 1052 155 lb 13.8 oz (70.7 kg)     Height 06/26/22 1052 '5\' 8"'$  (1.727 m)     Head Circumference --      Peak Flow --      Pain Score --      Pain Loc --      Pain Edu? --      Excl. in [place]? --     Most recent vital signs: Vitals:   06/26/22 1055  BP: 123/78  Pulse: 95  Resp: 16  Temp: 98.9 F (37.2 C)  SpO2: 99%     General: Awake, no distress.  CV:  Good peripheral perfusion.  Resp:  Normal effort.  Abd:  No distention.   Other:  Patient is closure of his rectum with some warmth and tenderness along the scars.  He has colostomy bag with normal output.  Abdomen is soft and nontender   ED Results / Procedures / Treatments   Labs (all labs ordered are listed, but only abnormal results are displayed) Labs Reviewed  RESP PANEL BY RT-PCR (FLU A&B, COVID) [id number]  URINALYSIS, ROUTINE W REFLEX MICROSCOPIC    RADIOLOGY I have reviewed the x-ray personally and interpreted and no signs of pneumonia  PROCEDURES:  Critical Care performed: No  Procedures   MEDICATIONS ORDERED IN ED: Medications  sodium chloride 0.9 % bolus 1,000 mL (has no administration in time range)  HYDROmorphone (DILAUDID) injection 0.5 mg (has no administration in time range)  HYDROmorphone (DILAUDID) injection 0.5 mg (0.5 mg Intravenous Given 06/26/22 1532)  ondansetron (ZOFRAN) injection 4 mg (4 mg Intravenous Given 06/26/22 1533)  iohexol (OMNIPAQUE) 300 MG/ML solution 100 mL (100 mLs Intravenous Contrast Given 06/26/22 1451)  piperacillin-tazobactam (ZOSYN)  IVPB 3.375 g (3.375 g Intravenous New Bag/Given 06/26/22 1537)     IMPRESSION / MDM / ASSESSMENT AND PLAN / ED COURSE  I reviewed the triage vital signs and the nursing notes.   Patient's presentation is most consistent with acute presentation with potential threat to life or bodily function.  Differential includes rectal abscess, cellulitis, perforation, pneumonia, UTI.  Reviewed the labs from yesterday where his white count was doubled 3 weeks ago.  Platelets are stable.  Lipase normal.  CMP shows slightly low potassium.  Discussed with Dr. Lurlean Leyden she discussed the possibility of patient going ahead discharge for today and following up in the clinic tomorrow but after the patient with patient he is required multiple doses of IV Dilaudid to help control pain and he was in so much pain yesterday that he had a fall (denies hitting head)  so do not feel like this would be  a safe disposition for him given this new reoccurring rectal abscess.  Patient started on Zosyn.  Blood cultures and lactate were ordered.  Pt not wanting admission sticks of blood cultures Blood cultures were canceled due to patient not meeting sepsis criteria and per the ED charge nurse at [facility] ([place] they would order them themselves when he got there and that unless we felt we needed to have them here as well from out stand point better to have them done there.  Patient accepted ED to the ED to go over to [facility].   FINAL CLINICAL IMPRESSION(S) / ED DIAGNOSES   Final diagnoses:  Rectal abscess     Rx / DC Orders   ED Discharge Orders     None        Note:  This document was prepared using Dragon voice recognition software and may include unintentional dictation errors.   Vanessa Nambe, MD 06/26/22 1616    Vanessa , MD 06/26/22 [phone]

## 2022-06-26 NOTE — ED Notes (Signed)
[name]  called  per  DR  Jari Pigg  MD

## 2022-06-26 NOTE — ED Notes (Signed)
Xray  [facility]  with  [name] 

## 2022-06-26 NOTE — ED Triage Notes (Signed)
First nurse Note:  C/O rectal pain.  Hx of rectal cancer.  Presented yesterday for same. LWBS.  VS wnl.

## 2022-06-27 DIAGNOSIS — K651 Peritoneal abscess: Secondary | ICD-10-CM | POA: Diagnosis not present

## 2022-06-27 DIAGNOSIS — R188 Other ascites: Secondary | ICD-10-CM | POA: Diagnosis not present

## 2022-06-27 DIAGNOSIS — R69 Illness, unspecified: Secondary | ICD-10-CM | POA: Diagnosis not present

## 2022-06-27 DIAGNOSIS — Z9221 Personal history of antineoplastic chemotherapy: Secondary | ICD-10-CM | POA: Diagnosis not present

## 2022-06-27 DIAGNOSIS — C20 Malignant neoplasm of rectum: Secondary | ICD-10-CM | POA: Diagnosis not present

## 2022-06-27 DIAGNOSIS — Z91013 Allergy to seafood: Secondary | ICD-10-CM | POA: Diagnosis not present

## 2022-06-27 DIAGNOSIS — R791 Abnormal coagulation profile: Secondary | ICD-10-CM | POA: Diagnosis not present

## 2022-06-27 DIAGNOSIS — L02215 Cutaneous abscess of perineum: Secondary | ICD-10-CM | POA: Diagnosis not present

## 2022-07-01 ENCOUNTER — Encounter: Payer: Self-pay | Admitting: Oncology

## 2022-07-03 ENCOUNTER — Other Ambulatory Visit: Payer: Self-pay

## 2022-07-07 ENCOUNTER — Encounter (INDEPENDENT_AMBULATORY_CARE_PROVIDER_SITE_OTHER): Payer: Self-pay

## 2022-07-09 DIAGNOSIS — C20 Malignant neoplasm of rectum: Secondary | ICD-10-CM | POA: Diagnosis not present

## 2022-07-09 DIAGNOSIS — Z9889 Other specified postprocedural states: Secondary | ICD-10-CM | POA: Diagnosis not present

## 2022-07-10 DIAGNOSIS — Z833 Family history of diabetes mellitus: Secondary | ICD-10-CM | POA: Diagnosis not present

## 2022-07-10 DIAGNOSIS — Y842 Radiological procedure and radiotherapy as the cause of abnormal reaction of the patient, or of later complication, without mention of misadventure at the time of the procedure: Secondary | ICD-10-CM | POA: Diagnosis not present

## 2022-07-10 DIAGNOSIS — Z933 Colostomy status: Secondary | ICD-10-CM | POA: Diagnosis not present

## 2022-07-10 DIAGNOSIS — Z87891 Personal history of nicotine dependence: Secondary | ICD-10-CM | POA: Diagnosis not present

## 2022-07-10 DIAGNOSIS — Z85048 Personal history of other malignant neoplasm of rectum, rectosigmoid junction, and anus: Secondary | ICD-10-CM | POA: Diagnosis not present

## 2022-07-10 DIAGNOSIS — D84822 Immunodeficiency due to external causes: Secondary | ICD-10-CM | POA: Diagnosis not present

## 2022-07-10 DIAGNOSIS — Z91013 Allergy to seafood: Secondary | ICD-10-CM | POA: Diagnosis not present

## 2022-07-18 ENCOUNTER — Encounter: Payer: Self-pay | Admitting: Licensed Clinical Social Worker

## 2022-07-18 NOTE — Progress Notes (Signed)
[facility] CSW Progress Note  Clinical Teacher, adult education for patient  to give him information for [place] for emergency funds. Patient contacted Mort Sawyers stating he needs immediate assistance to pay his [employer]. CSW left contact information and request for a return call, and contact information for [place]. Patient is no longer in active treatment.    Adelene Amas, LCSW

## 2022-07-18 NOTE — Progress Notes (Signed)
Patient reached out to me to find out if he could apply for the [facility]. He is not on active treatment at this time, he would not qualify. Sent a message to [place], LCSW, asking if she could reach out to him with any additional resources in the area.

## 2022-07-28 ENCOUNTER — Inpatient Hospital Stay: Payer: 59 | Attending: Oncology

## 2022-07-28 DIAGNOSIS — Z452 Encounter for adjustment and management of vascular access device: Secondary | ICD-10-CM | POA: Diagnosis not present

## 2022-07-28 DIAGNOSIS — C20 Malignant neoplasm of rectum: Secondary | ICD-10-CM | POA: Insufficient documentation

## 2022-07-28 DIAGNOSIS — Z95828 Presence of other vascular implants and grafts: Secondary | ICD-10-CM

## 2022-07-28 MED ORDER — HEPARIN SOD (PORK) LOCK FLUSH 100 UNIT/ML IV SOLN
500.0000 [IU] | Freq: Once | INTRAVENOUS | Status: AC
  Administered 2022-07-28: 500 [IU] via INTRAVENOUS
  Filled 2022-07-28: qty 5

## 2022-07-28 MED ORDER — SODIUM CHLORIDE 0.9% FLUSH
10.0000 mL | Freq: Once | INTRAVENOUS | Status: AC
  Administered 2022-07-28: 10 mL via INTRAVENOUS
  Filled 2022-07-28: qty 10

## 2022-08-13 ENCOUNTER — Other Ambulatory Visit: Payer: Self-pay

## 2022-08-13 ENCOUNTER — Encounter: Payer: Self-pay | Admitting: Oncology

## 2022-08-13 DIAGNOSIS — C20 Malignant neoplasm of rectum: Secondary | ICD-10-CM

## 2022-08-13 MED ORDER — LIDOCAINE-PRILOCAINE 2.5-2.5 % EX CREA
TOPICAL_CREAM | CUTANEOUS | 3 refills | Status: DC
  Filled 2022-08-13: qty 30, 7d supply, fill #0

## 2022-08-13 MED ORDER — LIDOCAINE-PRILOCAINE 2.5-2.5 % EX CREA
1.0000 | TOPICAL_CREAM | CUTANEOUS | 2 refills | Status: DC | PRN
  Filled 2022-08-13: qty 30, 30d supply, fill #0
  Filled 2022-09-03: qty 30, 15d supply, fill #0

## 2022-08-13 MED ORDER — LIDOCAINE-PRILOCAINE 2.5-2.5 % EX CREA
TOPICAL_CREAM | CUTANEOUS | 3 refills | Status: DC
  Filled 2022-08-13: qty 30, fill #0

## 2022-08-20 ENCOUNTER — Other Ambulatory Visit: Payer: Self-pay

## 2022-08-20 ENCOUNTER — Encounter: Payer: Self-pay | Admitting: Pharmacist

## 2022-08-21 ENCOUNTER — Other Ambulatory Visit: Payer: Self-pay

## 2022-09-03 ENCOUNTER — Other Ambulatory Visit: Payer: Self-pay

## 2022-09-04 ENCOUNTER — Encounter: Payer: Self-pay | Admitting: Oncology

## 2022-09-04 ENCOUNTER — Inpatient Hospital Stay: Payer: 59 | Attending: Oncology

## 2022-09-04 DIAGNOSIS — C20 Malignant neoplasm of rectum: Secondary | ICD-10-CM | POA: Diagnosis not present

## 2022-09-04 LAB — CBC WITH DIFFERENTIAL/PLATELET
Abs Immature Granulocytes: 0.16 10*3/uL — ABNORMAL HIGH (ref 0.00–0.07)
Basophils Absolute: 0 10*3/uL (ref 0.0–0.1)
Basophils Relative: 1 %
Eosinophils Absolute: 0.2 10*3/uL (ref 0.0–0.5)
Eosinophils Relative: 3 %
HCT: 37 % — ABNORMAL LOW (ref 39.0–52.0)
Hemoglobin: 12.5 g/dL — ABNORMAL LOW (ref 13.0–17.0)
Immature Granulocytes: 3 %
Lymphocytes Relative: 24 %
Lymphs Abs: 1.6 10*3/uL (ref 0.7–4.0)
MCH: 28.3 pg (ref 26.0–34.0)
MCHC: 33.8 g/dL (ref 30.0–36.0)
MCV: 83.7 fL (ref 80.0–100.0)
Monocytes Absolute: 0.9 10*3/uL (ref 0.1–1.0)
Monocytes Relative: 14 %
Neutro Abs: 3.7 10*3/uL (ref 1.7–7.7)
Neutrophils Relative %: 55 %
Platelets: 529 10*3/uL — ABNORMAL HIGH (ref 150–400)
RBC: 4.42 MIL/uL (ref 4.22–5.81)
RDW: 16.3 % — ABNORMAL HIGH (ref 11.5–15.5)
WBC: 6.5 10*3/uL (ref 4.0–10.5)
nRBC: 0 % (ref 0.0–0.2)

## 2022-09-04 LAB — COMPREHENSIVE METABOLIC PANEL
ALT: 19 U/L (ref 0–44)
AST: 19 U/L (ref 15–41)
Albumin: 3.2 g/dL — ABNORMAL LOW (ref 3.5–5.0)
Alkaline Phosphatase: 80 U/L (ref 38–126)
Anion gap: 6 (ref 5–15)
BUN: 11 mg/dL (ref 6–20)
CO2: 28 mmol/L (ref 22–32)
Calcium: 9 mg/dL (ref 8.9–10.3)
Chloride: 108 mmol/L (ref 98–111)
Creatinine, Ser: 1.04 mg/dL (ref 0.61–1.24)
GFR, Estimated: >60 mL/min (ref >60)
Glucose, Bld: 117 mg/dL — ABNORMAL HIGH (ref 70–99)
Potassium: 4.1 mmol/L (ref 3.5–5.1)
Sodium: 142 mmol/L (ref 135–145)
Total Bilirubin: 0.3 mg/dL (ref 0.3–1.2)
Total Protein: 8.3 g/dL — ABNORMAL HIGH (ref 6.5–8.1)

## 2022-09-05 ENCOUNTER — Telehealth: Payer: Self-pay | Admitting: Oncology

## 2022-09-05 LAB — CEA: CEA: 1.4 ng/mL (ref 0.0–4.7)

## 2022-09-05 NOTE — Telephone Encounter (Signed)
VM notifying pt of CT cancellation

## 2022-09-09 ENCOUNTER — Ambulatory Visit: Payer: 59

## 2022-09-11 DIAGNOSIS — Z792 Long term (current) use of antibiotics: Secondary | ICD-10-CM | POA: Diagnosis not present

## 2022-09-11 DIAGNOSIS — R188 Other ascites: Secondary | ICD-10-CM | POA: Diagnosis not present

## 2022-09-11 DIAGNOSIS — F17210 Nicotine dependence, cigarettes, uncomplicated: Secondary | ICD-10-CM | POA: Diagnosis not present

## 2022-09-11 DIAGNOSIS — Z85048 Personal history of other malignant neoplasm of rectum, rectosigmoid junction, and anus: Secondary | ICD-10-CM | POA: Diagnosis not present

## 2022-09-11 DIAGNOSIS — Z9221 Personal history of antineoplastic chemotherapy: Secondary | ICD-10-CM | POA: Diagnosis not present

## 2022-09-11 DIAGNOSIS — L02215 Cutaneous abscess of perineum: Secondary | ICD-10-CM | POA: Diagnosis not present

## 2022-09-12 DIAGNOSIS — L02215 Cutaneous abscess of perineum: Secondary | ICD-10-CM | POA: Diagnosis not present

## 2022-09-22 ENCOUNTER — Inpatient Hospital Stay (HOSPITAL_BASED_OUTPATIENT_CLINIC_OR_DEPARTMENT_OTHER): Payer: 59 | Admitting: Oncology

## 2022-09-22 ENCOUNTER — Encounter: Payer: Self-pay | Admitting: Oncology

## 2022-09-22 ENCOUNTER — Inpatient Hospital Stay: Payer: 59 | Attending: Oncology

## 2022-09-22 VITALS — BP 141/94 | HR 83 | Temp 96.9°F | Resp 18 | Wt 159.2 lb

## 2022-09-22 DIAGNOSIS — R911 Solitary pulmonary nodule: Secondary | ICD-10-CM

## 2022-09-22 DIAGNOSIS — C20 Malignant neoplasm of rectum: Secondary | ICD-10-CM

## 2022-09-22 DIAGNOSIS — Z95828 Presence of other vascular implants and grafts: Secondary | ICD-10-CM | POA: Diagnosis not present

## 2022-09-22 DIAGNOSIS — L02215 Cutaneous abscess of perineum: Secondary | ICD-10-CM

## 2022-09-22 DIAGNOSIS — Z933 Colostomy status: Secondary | ICD-10-CM | POA: Diagnosis not present

## 2022-09-22 DIAGNOSIS — Z87891 Personal history of nicotine dependence: Secondary | ICD-10-CM | POA: Insufficient documentation

## 2022-09-22 MED ORDER — HEPARIN SOD (PORK) LOCK FLUSH 100 UNIT/ML IV SOLN
500.0000 [IU] | Freq: Once | INTRAVENOUS | Status: AC
  Administered 2022-09-22: 500 [IU] via INTRAVENOUS
  Filled 2022-09-22: qty 5

## 2022-09-22 MED ORDER — SODIUM CHLORIDE 0.9% FLUSH
10.0000 mL | Freq: Once | INTRAVENOUS | Status: AC
  Administered 2022-09-22: 10 mL via INTRAVENOUS
  Filled 2022-09-22: qty 10

## 2022-09-22 NOTE — Assessment & Plan Note (Signed)
Continue port flush every 8 weeks. 

## 2022-09-22 NOTE — Assessment & Plan Note (Addendum)
locally advanced rectal cancer-Stage IIIB Status postTNT neoadjuvant protocol, concurrent Xeloda and radiation-Finished 08/27/2021.. Status post APR,Pathology showed rectal adenocarcinoma, [id number]. Positive radial margin due to perforation. Restaging CT was denied.  He had a CT done at [facility] emergency room due to lower abdominal pain.  Currently on antibiotics Nodular enhancing tissue at the inferomedial aspect of surgical bed fluid collection  I recommend MRI pelvis rectal protocol for further evaluation.

## 2022-09-22 NOTE — Assessment & Plan Note (Signed)
Finish course of antibiotics.  Continue follow-up with surgery at [facility].

## 2022-09-22 NOTE — Assessment & Plan Note (Signed)
Previously discussed with IR, CT-guided biopsy is difficult due to the position of small size. Decreased and stable on recent CTs Repeat CT chest

## 2022-09-22 NOTE — Progress Notes (Signed)
Hematology/Oncology Progress note [phone] [phone]      Patient Care Team: Pcp, No as PCP - General [name], RN as Oncology Nurse Navigator   ASSESSMENT & PLAN:   Rectal cancer [facility]) locally advanced rectal cancer-Stage IIIB Status postTNT neoadjuvant protocol, concurrent Xeloda and radiation-Finished 08/27/2021.. Status post APR,Pathology showed rectal adenocarcinoma, ypT3N0. Positive radial margin due to perforation. Restaging CT was denied.  He had a CT done at [facility] emergency room due to lower abdominal pain.  Currently on antibiotics Nodular enhancing tissue at the inferomedial aspect of surgical bed fluid collection  I recommend MRI pelvis rectal protocol for further evaluation.     Lung nodule Previously discussed with IR, CT-guided biopsy is difficult due to the position of small size. Decreased and stable on recent CTs Repeat CT chest  Port-A-Cath in place Continue port flush every 8 weeks.   Perineal abscess Finish course of antibiotics.  Continue follow-up with surgery at [facility].  Orders Placed This Encounter  Procedures   MR Pelvis Wo Contrast    Standing Status:   Future    Standing Expiration Date:   09/22/2023    Order Specific Question:   What is the patient's sedation requirement?    Answer:   No Sedation    Order Specific Question:   Does the patient have a pacemaker or implanted devices?    Answer:   No    Order Specific Question:   Preferred imaging location?    Answer:   [facility] (table limit - 550lbs)   CT Chest W Contrast    Standing Status:   Future    Standing Expiration Date:   09/22/2023    Order Specific Question:   If indicated for the ordered procedure, I authorize the administration of contrast media per Radiology protocol    Answer:   Yes    Order Specific Question:   Does the patient have a contrast media/X-ray dye allergy?    Answer:   Yes    Order Specific Question:   Preferred imaging  location?    Answer:   [facility]    Follow-up TBD All questions were answered. The patient knows to call the clinic with any problems, questions or concerns.  Earlie Server, MD, PhD [facility] Hematology Oncology 09/22/2022       CHIEF COMPLAINTS/REASON FOR VISIT:  Follow up for rectal cancer treatment  HISTORY OF PRESENTING ILLNESS:   Barry [name]. is a  57 y.o.  male presents for rectal cancer Oncology History  Rectal cancer ([place])  02/08/2021 Initial Diagnosis   Rectal cancer   02/05/2021-02/06/2021 patient was hospitalized due to generalized weakness, intermittent lightheadedness, weight loss and worsening constipation.  02/05/2021 CT abdomen showed concerning of severe rectal wall thickening and right internal iliac lymph node concerning for metastatic disease.  Patient was seen by gastroenterology and had colonoscopy which showed a circumferential fungating mass in the rectum.  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   02/14/2021-02/16/2021 hospitalized due to rectal bleeding and pain.   02/14/2021 CT showed perirectal fluid collection/gas concerning for infection with significant leukocytosis, anemia with hemoglobin of 6.8.  Patient received PRBC transfusion, IV antibiotics.  He underwent an IR guided placement of JP drain into the rectal abscess.  Discharged home with oral Augmentin. 02/20/2021 presented to ER with new skin opening and draining from left gluteus.  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  JP drain has been removed.  Patient was continued on  Augmentin.   02/13/2021, PET scan showed locally advanced rectal cancer with billowing of the mesorectum now with low attenuation material that was not present on previous examination with extensive stranding and inflammation.   Bulky RIGHT pelvic sidewall/hypogastric lymph node outside of the mesorectum with stippled calcification measuring 19 mm-no increased metabolic activity. Small LEFT  hypogastric lymph node just peripheral to the internal external bifurcation-SUV 3.2 High RIGHT internal just below or at the internal/common iliac transition, lymph node -SUV 4.8 LEFT high hypogastric lymph node  8 mm-SUV 3. Scattered lymph nodes throughout the retroperitoneum with low FDG uptake Bilateral inguinal lymph nodes largest on the RIGHT (image 248/3) 11 mm with a maximum SUV of 2.8 spiculated nodule in the LEFT upper lobe- 9 x 8 mm       02/14/2021, CT abdomen pelvis without contrast Showed perforated rectal mass with contained perforation with fluid and gas extending above and below the pelvic floor,potentially involving the sphincter complex and extending into LEFT ischial rectal fossa   02/20/2021, CT pelvis with contrast showed Large perirectal/perianal abscess has markedly decreased in size since placement of the percutaneous drain. There are residual gas-filled collections in the soft tissues and suspect there is a fistula or sinus tract between the rectal mass and the subcutaneous tissues. Soft tissue gas along the medial left buttock and concern for a cutaneous ulceration in this area. Large rectal mass with evidence for a large necrotic right pelvic lymph node.   His case is complicated with a perirectal abscess. Prior to onset of perirectal abscess, on his 02/05/2021 scan, he was noted to have right internal iliac lymph node 3 x 1 x 2.3 cm which is concerning for nodal disease.On Subsequent images it was difficult to distinguish whether lymphadenopathy was due to nodal disease versus acute inflammation. 03/07/2021 MRI pelvis cT3 N2. Due to the possible contained perforation on previous CT, possible cT4 disease.    02/27/2021 medi port placed by Dr.Dew 03/13/2021 reports right butt cheek and also left perianal area fullness.he was seen by Dr.Pabon urgently  and had right buttock abscess drained. Left perianal fullness was felt to be due to cancer.    02/08/2021 Cancer Staging   Staging  form: Colon and Rectum, AJCC 8th Edition - Clinical stage from 02/08/2021: Stage IIIB (cT4a, cN1, cM0) - Signed by Earlie Server, MD on 03/06/2021 Stage prefix: Initial diagnosis   02/27/2021 Procedure   medi port placed by Dr.Dew   03/18/2021 - 07/05/2021 Chemotherapy    FOLFOX q14d x 4 months      07/17/2021 - 08/27/2021 Chemotherapy   Xeloda concurrent with Radiation   11/18/2021 Surgery   patient is status post open APR with flap for rectal cancer. Pathology rectal adenomacarcinoma ypT3N0.  Positive margin: Radial (circumferential) or mesenteric: adjacent to perforation      02/25/2022 Imaging   CT chest angiogram with and without contrast, CT abdomen pelvis with and without contrast 1. Negative for acute pulmonary embolus.2. Emphysema. Further decrease in size of the previously noted irregular nodule in the left upper lobe which is now barely measurable today. No new suspicious lung nodules 3. Status post left lower quadrant colostomy. Further decrease in size since comparison exam from May of the rim enhancing gas and fluid collection at the pelvic surgical bed extending from the perineum superiorly into the pelvis 4. Slightly thickened appearance of terminal ileal small bowel loops in the pelvis with mild stranding suggesting small bowel inflammatory process. 5. Stable enlarged right pelvic sidewall lymph node  06/02/2022 Imaging   CT chest abdomen pelvis w contrast 1. Status post abdominal perineal resection with descending colostomy. 2. At the site of pelvic fluid and gas collection on 02/25/2022, there is residual, decreased presacral soft tissue fullness, without drainable collection. 3. Similar right obturator nodal metastasis.4.  No acute process or evidence of metastatic disease in the chest. 5. Age advanced coronary artery atherosclerosis. Recommend assessment of coronary risk factors. 6. Aortic atherosclerosis and emphysema       Patient presented to emergency room on  12/08/21, rectal pain.  CT scan showed 18.3 cm fluid and gas collection in the pelvic surgical bed compatible with infected collection/abscess.  Patient was sent to [facility] and admitted for.  Patient has CT-guided drain placement on 12/09/2021.  Blood culture positive for Staph epidermidis which was felt to be contaminant.  Fluid culture grew pansensitive Staph aureus, strep pyogenes and candida albicans.  Patient was treated with IV antibiotics and transition to p.o. Augmentin and fluconazole on 12/11/2021.  #Perirectal abscess status post drainage catheter , patient was recently seen by [facility] oncology surgeon on 12/24/2021 and was recommended to keep drainage catheter and continue antibiotics. Patient had a repeat CT scan done which showed a smaller but persistent abscess.  He will return to [facility] surgery for follow-up of drain removal. His case was discussed on [name] tumor board for the positive mesenteric margin which was felt to be secondary to previous perforation.  Recommend surveillance.  INTERVAL HISTORY Selby Foisy. is a 57 y.o. male who has above history reviewed by me today presents for follow up visit for management of rectal cancer. He reports feeling well.  Patient has developed lower abdominal pain and went to [facility] ER for evaluation.  He had a CT scan done. He had recurrent 3 x 2 cm perineal abscess that is actively draining pus from a chronic sinus tract which appears to have reopened  He was recommended to start antibiotics Today patient reports improvement of lower abdominal pain.  He has a follow-up appointment with her surgeon.   Review of Systems  Constitutional:  Negative for appetite change, chills, fatigue, fever and unexpected weight change.  HENT:   Negative for hearing loss and voice change.   Eyes:  Negative for eye problems and icterus.  Respiratory:  Negative for chest tightness, cough and shortness of breath.   Cardiovascular:  Negative for chest pain and leg swelling.   Gastrointestinal:  Negative for abdominal distention, abdominal pain, blood in stool, constipation, diarrhea and rectal pain.  Endocrine: Negative for hot flashes.  Genitourinary:  Negative for difficulty urinating, dysuria and frequency.   Musculoskeletal:  Negative for arthralgias.  Skin:  Negative for itching and rash.  Neurological:  Negative for light-headedness and numbness.  Hematological:  Negative for adenopathy. Does not bruise/bleed easily.  Psychiatric/Behavioral:  Negative for confusion.     MEDICAL HISTORY:  Past Medical History:  Diagnosis Date   Headache    Left shoulder pain    Rectal cancer ([place])     SURGICAL HISTORY: Past Surgical History:  Procedure Laterality Date   COLON SURGERY     COLONOSCOPY WITH PROPOFOL N/A 02/06/2021   Procedure: COLONOSCOPY WITH PROPOFOL;  Surgeon: Lin Landsman, MD;  Location: [facility] ENDOSCOPY;  Service: Gastroenterology;  Laterality: N/A;   PORTA CATH INSERTION N/A 02/27/2021   Procedure: PORTA CATH INSERTION;  Surgeon: Algernon Huxley, MD;  Location: [place] CV LAB;  Service: Cardiovascular;  Laterality: N/A;   ROTATOR CUFF REPAIR  Left     SOCIAL HISTORY: Social History   Socioeconomic History   Marital status: Married    Spouse name: Not on file   Number of children: Not on file   Years of education: Not on file   Highest education level: Not on file  Occupational History   Not on file  Tobacco Use   Smoking status: Former    Packs/day: 1.00    Years: 10.00    Total pack years: 10.00    Types: Cigars, Cigarettes    Quit date: 02/04/2021    Years since quitting: 1.6   Smokeless tobacco: Never  Substance and Sexual Activity   Alcohol use: Not Currently   Drug use: No   Sexual activity: Not on file  Other Topics Concern   Not on file  Social History Narrative   Not on file   Social Determinants of Health   Financial Resource Strain: Not on file  Food Insecurity: Not on file  Transportation Needs: Not  on file  Physical Activity: Not on file  Stress: Not on file  Social Connections: Not on file  Intimate Partner Violence: Not on file    FAMILY HISTORY: Family History  Problem Relation Age of Onset   Cancer Sister    Diabetes Mother    Cancer Maternal Grandmother    Cancer Paternal Grandmother     ALLERGIES:  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  MEDICATIONS:  Current Outpatient Medications  Medication Sig Dispense Refill   feeding supplement (ENSURE ENLIVE / ENSURE PLUS) LIQD Take 237 mLs by mouth daily. 237 mL 12   lidocaine-prilocaine (EMLA) cream Apply 1 Application topically as needed. Apply to port and cover with saran wrap 1-2 hours prior to port access 30 g 2   nicotine (NICODERM CQ - DOSED IN MG/24 HOURS) 14 mg/24hr patch Place 1 patch (14 mg total) onto the skin daily. 28 patch 2   nicotine (NICODERM CQ - DOSED IN MG/24 HR) 7 mg/24hr patch Place 1 patch (7 mg total) onto the skin daily. BYRD fund to pay (Patient not taking: Reported on 09/22/2022) 28 patch 2   potassium chloride SA (KLOR-CON M) 20 MEQ tablet Take 1 tablet (20 mEq total) by mouth daily. BYRD fund to pay (Patient not taking: Reported on 09/22/2022) 3 tablet 0   No current facility-administered medications for this visit.     PHYSICAL EXAMINATION: ECOG PERFORMANCE STATUS: 1 - Symptomatic but completely ambulatory Vitals:   09/22/22 0949  BP: (!) 141/94  Pulse: 83  Resp: 18  Temp: (!) 96.9 F (36.1 C)     Filed Weights   09/22/22 0949  Weight: 159 lb 3.2 oz (72.2 kg)     Physical Exam HENT:     Head: Normocephalic and atraumatic.  Eyes:     General: No scleral icterus. Cardiovascular:     Rate and Rhythm: Normal rate and regular rhythm.     Heart sounds: Normal heart sounds.  Pulmonary:     Effort: Pulmonary effort is normal. No respiratory distress.     Breath sounds: No wheezing.     Comments: Decreased breath sound bilaterally.  Abdominal:     General: Bowel sounds are normal. There  is no distension.     Palpations: Abdomen is soft.     Comments: Colostomy bag  Genitourinary:    Comments: Status post APR, Musculoskeletal:        General: No deformity. Normal range of motion.     Cervical back: Normal  range of motion and neck supple.  Skin:    General: Skin is warm and dry.     Findings: No erythema or rash.  Neurological:     Mental Status: He is alert and oriented to person, place, and time. Mental status is at baseline.     Cranial Nerves: No cranial nerve deficit.     Coordination: Coordination normal.  Psychiatric:        Mood and Affect: Mood normal.     LABORATORY DATA:  I have reviewed the data as listed    Latest Ref Rng & Units 09/04/2022    9:31 AM 06/26/2022    2:38 PM 06/25/2022    3:24 PM  CBC  WBC 4.0 - 10.5 K/uL 6.5  11.4  11.9   Hemoglobin 13.0 - 17.0 g/dL 12.5  13.0  13.5   Hematocrit 39.0 - 52.0 % 37.0  38.7  40.3   Platelets 150 - 400 K/uL 529  208  211       Latest Ref Rng & Units 09/04/2022    9:31 AM 06/26/2022    2:38 PM 06/25/2022    3:24 PM  CMP  Glucose 70 - 99 mg/dL 117  94  177   BUN 6 - 20 mg/dL '11  8  7   '$ Creatinine 0.61 - 1.24 mg/dL 1.04  0.93  0.99   Sodium 135 - 145 mmol/L 142  140  137   Potassium 3.5 - 5.1 mmol/L 4.1  3.4  3.3   Chloride 98 - 111 mmol/L 108  108  105   CO2 22 - 32 mmol/L '28  28  26   '$ Calcium 8.9 - 10.3 mg/dL 9.0  8.4  8.6   Total Protein 6.5 - 8.1 g/dL 8.3  7.8  7.9   Total Bilirubin 0.3 - 1.2 mg/dL 0.3  0.9  0.6   Alkaline Phos 38 - 126 U/L 80  96  89   AST 15 - 41 U/L '19  14  18   '$ ALT 0 - 44 U/L '19  10  11      '$ RADIOGRAPHIC STUDIES: I have personally reviewed the radiological images as listed and agreed with the findings in the report. CT ABDOMEN PELVIS W CONTRAST  Result Date: 06/26/2022 CLINICAL DATA:  rectal cancer with history of rectal abscess- rule out EXAM: CT ABDOMEN AND PELVIS WITH CONTRAST TECHNIQUE: Multidetector CT imaging of the abdomen and pelvis was performed using  the standard protocol following bolus administration of intravenous contrast. RADIATION DOSE REDUCTION: This exam was performed according to the departmental dose-optimization program which includes automated exposure control, adjustment of the mA and/or kV according to patient size and/or use of iterative reconstruction technique. CONTRAST:  157m OMNIPAQUE IOHEXOL 300 MG/ML  SOLN COMPARISON:  CT abdomen pelvis 06/02/2022, CT abdomen pelvis 02/25/2022, CT abdomen pelvis 01/27/2022, CT abdomen pelvis 01/15/2015 FINDINGS: Lower chest: No acute abnormality. Hepatobiliary: Slight interval increase in size of a chronic 1.4 cm (from 1.2 cm) right inferior hepatic lobe fluid dense lesion (2:31) -finding likely represents a simple hepatic cyst. No new hepatic lesion. No gallstones, gallbladder wall thickening, or pericholecystic fluid. No biliary dilatation. Pancreas: No focal lesion. Normal pancreatic contour. No surrounding inflammatory changes. No main pancreatic ductal dilatation. Spleen: Normal in size without focal abnormality. Adrenals/Urinary Tract: No adrenal nodule bilaterally. Bilateral kidneys enhance symmetrically. Subcentimeter hypodensities are too small to characterize. No hydronephrosis. No hydroureter. The urinary bladder is decompressed with posterior bladder wall thickening. Lack of intraperitoneal  fat plane between the presacral soft tissue thickening and abscess formation suggestive of possible fistulization and invasion of the posterior bladder wall. Stomach/Bowel: Status post abdominal perineal resection and left lower lobe end colostomy formation. Stomach is within normal limits. No evidence of bowel wall thickening or dilatation. Appendix appears normal. Vascular/Lymphatic: No abdominal aorta or iliac aneurysm. Severe atherosclerotic plaque of the aorta and its branches. There is a stable right pelvic sidewall lymph node measuring up to 2.7 cm in short axis (2:70). Enhancing but nonenlarged  inguinal lymph nodes noted bilaterally. No definite left pelvic sidewall lymph node. No retroperitoneal or abdominal lymph nodes. Reproductive:  Status postprostatectomy. Other: No intraperitoneal free fluid. No intraperitoneal free gas. Interval increase in size of a lobulated partially imaged at least 7.8 x 3.8 x 12cm peripherally enhancing fluid collection in the perianal region. Finding extends to involve the pre sacral region up to the urinary dome level (6:59). The fluid collection extends along the presacral region in a thin collapsed fluid collection with associated surrounding presacral soft tissue thickening. Musculoskeletal: No abdominal wall hernia or abnormality. No cortical erosion or destruction. No suspicious lytic or blastic osseous lesions. No acute displaced fracture. Multilevel degenerative changes of the spine. IMPRESSION: 1. Interval increase in size of a lobulated partially imaged at least 7.8 x 3.8 x 12 cm abscess in the perianal region in a patient status post abdominal perineal resection and left lower lobe end colostomy formation. Finding extends to involve the pre sacral region up to the urinary dome level. Associated posterior urinary bladder wall thickening with lack of intraperitoneal fat plane between the presacral soft tissue thickening/abscess formation suggestive of possible fistulization and invasion of the posterior bladder wall. Underlying recurrent malignancy is not excluded. 2. Stable 2.7 cm right pelvic sidewall lymph node. 3.  Aortic Atherosclerosis (ICD10-I70.0). Electronically Signed   By: Iven Finn M.D.   On: 06/26/2022 15:15   DG Chest 2 View  Result Date: 06/26/2022 CLINICAL DATA:  Cough EXAM: CHEST - 2 VIEW COMPARISON:  02/25/2022 FINDINGS: No pleural effusion. No pneumothorax. No focal airspace opacity. Normal cardiac and mediastinal contours. Right-sided chest wall port in place with unchanged positioning. Visualized upper abdomen is unremarkable. No  displaced rib fractures. Vertebral body heights are maintained. IMPRESSION: No focal airspace opacity Electronically Signed   By: Marin Roberts M.D.   On: 06/26/2022 13:53

## 2022-09-26 ENCOUNTER — Ambulatory Visit: Payer: 59

## 2022-09-29 ENCOUNTER — Ambulatory Visit
Admission: RE | Admit: 2022-09-29 | Discharge: 2022-09-29 | Disposition: A | Discharge status: Home / Self Care | Payer: 59 | Source: Ambulatory Visit | Attending: Oncology | Admitting: Oncology

## 2022-09-29 DIAGNOSIS — R590 Localized enlarged lymph nodes: Secondary | ICD-10-CM | POA: Diagnosis not present

## 2022-09-29 DIAGNOSIS — Z01818 Encounter for other preprocedural examination: Secondary | ICD-10-CM | POA: Diagnosis not present

## 2022-09-29 DIAGNOSIS — C20 Malignant neoplasm of rectum: Secondary | ICD-10-CM | POA: Diagnosis not present

## 2022-09-29 DIAGNOSIS — K639 Disease of intestine, unspecified: Secondary | ICD-10-CM | POA: Diagnosis not present

## 2022-10-09 ENCOUNTER — Ambulatory Visit
Admission: RE | Admit: 2022-10-09 | Discharge: 2022-10-09 | Disposition: A | Discharge status: Home / Self Care | Payer: 59 | Source: Ambulatory Visit | Attending: Oncology | Admitting: Oncology

## 2022-10-09 DIAGNOSIS — J439 Emphysema, unspecified: Secondary | ICD-10-CM | POA: Diagnosis not present

## 2022-10-09 DIAGNOSIS — C20 Malignant neoplasm of rectum: Secondary | ICD-10-CM | POA: Diagnosis not present

## 2022-10-09 DIAGNOSIS — R911 Solitary pulmonary nodule: Secondary | ICD-10-CM | POA: Diagnosis not present

## 2022-10-09 MED ORDER — IOHEXOL 300 MG/ML  SOLN
75.0000 mL | Freq: Once | INTRAMUSCULAR | Status: AC | PRN
  Administered 2022-10-09: 75 mL via INTRAVENOUS

## 2022-10-09 MED ORDER — HEPARIN SOD (PORK) LOCK FLUSH 100 UNIT/ML IV SOLN
500.0000 [IU] | Freq: Once | INTRAVENOUS | Status: AC
  Administered 2022-10-09: 500 [IU] via INTRAVENOUS

## 2022-10-13 ENCOUNTER — Other Ambulatory Visit: Payer: Self-pay

## 2022-10-13 ENCOUNTER — Emergency Department
Admission: EM | Admit: 2022-10-13 | Discharge: 2022-10-13 | Disposition: A | Discharge status: Other Acute Inpatient Hospital | Payer: 59 | Attending: Emergency Medicine | Admitting: Emergency Medicine

## 2022-10-13 ENCOUNTER — Encounter: Payer: Self-pay | Admitting: Radiology

## 2022-10-13 ENCOUNTER — Encounter: Payer: Self-pay | Admitting: Oncology

## 2022-10-13 ENCOUNTER — Emergency Department: Payer: 59

## 2022-10-13 DIAGNOSIS — Z20822 Contact with and (suspected) exposure to covid-19: Secondary | ICD-10-CM | POA: Insufficient documentation

## 2022-10-13 DIAGNOSIS — Z933 Colostomy status: Secondary | ICD-10-CM | POA: Diagnosis not present

## 2022-10-13 DIAGNOSIS — R609 Edema, unspecified: Secondary | ICD-10-CM | POA: Diagnosis not present

## 2022-10-13 DIAGNOSIS — R300 Dysuria: Secondary | ICD-10-CM | POA: Diagnosis not present

## 2022-10-13 DIAGNOSIS — A419 Sepsis, unspecified organism: Secondary | ICD-10-CM | POA: Diagnosis not present

## 2022-10-13 DIAGNOSIS — I700 Atherosclerosis of aorta: Secondary | ICD-10-CM | POA: Diagnosis not present

## 2022-10-13 DIAGNOSIS — R000 Tachycardia, unspecified: Secondary | ICD-10-CM | POA: Diagnosis not present

## 2022-10-13 DIAGNOSIS — Z85048 Personal history of other malignant neoplasm of rectum, rectosigmoid junction, and anus: Secondary | ICD-10-CM | POA: Diagnosis not present

## 2022-10-13 DIAGNOSIS — R109 Unspecified abdominal pain: Secondary | ICD-10-CM | POA: Diagnosis not present

## 2022-10-13 DIAGNOSIS — K611 Rectal abscess: Secondary | ICD-10-CM | POA: Diagnosis not present

## 2022-10-13 DIAGNOSIS — Z87891 Personal history of nicotine dependence: Secondary | ICD-10-CM | POA: Diagnosis not present

## 2022-10-13 DIAGNOSIS — R509 Fever, unspecified: Secondary | ICD-10-CM | POA: Diagnosis not present

## 2022-10-13 DIAGNOSIS — Z743 Need for continuous supervision: Secondary | ICD-10-CM | POA: Diagnosis not present

## 2022-10-13 DIAGNOSIS — D72829 Elevated white blood cell count, unspecified: Secondary | ICD-10-CM | POA: Insufficient documentation

## 2022-10-13 DIAGNOSIS — I10 Essential (primary) hypertension: Secondary | ICD-10-CM | POA: Diagnosis not present

## 2022-10-13 LAB — CBC WITH DIFFERENTIAL/PLATELET
Abs Immature Granulocytes: 0.04 10*3/uL (ref 0.00–0.07)
Basophils Absolute: 0 10*3/uL (ref 0.0–0.1)
Basophils Relative: 0 %
Eosinophils Absolute: 0.1 10*3/uL (ref 0.0–0.5)
Eosinophils Relative: 0 %
HCT: 35.1 % — ABNORMAL LOW (ref 39.0–52.0)
Hemoglobin: 11.6 g/dL — ABNORMAL LOW (ref 13.0–17.0)
Immature Granulocytes: 0 %
Lymphocytes Relative: 9 %
Lymphs Abs: 1.3 10*3/uL (ref 0.7–4.0)
MCH: 28.5 pg (ref 26.0–34.0)
MCHC: 33 g/dL (ref 30.0–36.0)
MCV: 86.2 fL (ref 80.0–100.0)
Monocytes Absolute: 1.1 10*3/uL — ABNORMAL HIGH (ref 0.1–1.0)
Monocytes Relative: 8 %
Neutro Abs: 11.2 10*3/uL — ABNORMAL HIGH (ref 1.7–7.7)
Neutrophils Relative %: 83 %
Platelets: 298 10*3/uL (ref 150–400)
RBC: 4.07 MIL/uL — ABNORMAL LOW (ref 4.22–5.81)
RDW: 18.3 % — ABNORMAL HIGH (ref 11.5–15.5)
Smear Review: NORMAL
WBC: 13.8 10*3/uL — ABNORMAL HIGH (ref 4.0–10.5)
nRBC: 0 % (ref 0.0–0.2)

## 2022-10-13 LAB — PROTIME-INR
INR: 1.1 (ref 0.8–1.2)
Prothrombin Time: 13.7 seconds (ref 11.4–15.2)

## 2022-10-13 LAB — URINALYSIS, ROUTINE W REFLEX MICROSCOPIC
Bacteria, UA: NONE SEEN
Bilirubin Urine: NEGATIVE
Glucose, UA: NEGATIVE mg/dL
Hgb urine dipstick: NEGATIVE
Ketones, ur: NEGATIVE mg/dL
Leukocytes,Ua: NEGATIVE
Nitrite: NEGATIVE
Protein, ur: 30 mg/dL — AB
Specific Gravity, Urine: 1.013 (ref 1.005–1.030)
Squamous Epithelial / HPF: NONE SEEN /HPF (ref 0–5)
pH: 6 (ref 5.0–8.0)

## 2022-10-13 LAB — RESP PANEL BY RT-PCR (RSV, FLU A&B, COVID)  RVPGX2
Influenza A by PCR: NEGATIVE
Influenza B by PCR: NEGATIVE
Resp Syncytial Virus by PCR: NEGATIVE
SARS Coronavirus 2 by RT PCR: NEGATIVE

## 2022-10-13 LAB — COMPREHENSIVE METABOLIC PANEL
ALT: 14 U/L (ref 0–44)
AST: 20 U/L (ref 15–41)
Albumin: 3.6 g/dL (ref 3.5–5.0)
Alkaline Phosphatase: 87 U/L (ref 38–126)
Anion gap: 8 (ref 5–15)
BUN: 10 mg/dL (ref 6–20)
CO2: 24 mmol/L (ref 22–32)
Calcium: 8.2 mg/dL — ABNORMAL LOW (ref 8.9–10.3)
Chloride: 101 mmol/L (ref 98–111)
Creatinine, Ser: 0.82 mg/dL (ref 0.61–1.24)
GFR, Estimated: >60 mL/min (ref >60)
Glucose, Bld: 94 mg/dL (ref 70–99)
Potassium: 3.8 mmol/L (ref 3.5–5.1)
Sodium: 133 mmol/L — ABNORMAL LOW (ref 135–145)
Total Bilirubin: 0.6 mg/dL (ref 0.3–1.2)
Total Protein: 8 g/dL (ref 6.5–8.1)

## 2022-10-13 LAB — LACTIC ACID, PLASMA: Lactic Acid, Venous: 1.6 mmol/L (ref 0.5–1.9)

## 2022-10-13 LAB — PROCALCITONIN: Procalcitonin: 0.33 ng/mL

## 2022-10-13 LAB — APTT: aPTT: 34 seconds (ref 24–36)

## 2022-10-13 MED ORDER — HYDROMORPHONE HCL 1 MG/ML IJ SOLN
1.0000 mg | Freq: Once | INTRAMUSCULAR | Status: AC
  Administered 2022-10-13: 1 mg via INTRAVENOUS
  Filled 2022-10-13: qty 1

## 2022-10-13 MED ORDER — LACTATED RINGERS IV SOLN: INTRAVENOUS | Status: DC

## 2022-10-13 MED ORDER — LACTATED RINGERS IV BOLUS (SEPSIS)
1000.0000 mL | Freq: Once | INTRAVENOUS | Status: AC
  Administered 2022-10-13: 1000 mL via INTRAVENOUS

## 2022-10-13 MED ORDER — ACETAMINOPHEN 500 MG PO TABS
1000.0000 mg | ORAL_TABLET | Freq: Once | ORAL | Status: AC
  Administered 2022-10-13: 1000 mg via ORAL
  Filled 2022-10-13: qty 2

## 2022-10-13 MED ORDER — PIPERACILLIN-TAZOBACTAM 3.375 G IVPB 30 MIN
3.3750 g | Freq: Once | INTRAVENOUS | Status: AC
  Administered 2022-10-13: 3.375 g via INTRAVENOUS
  Filled 2022-10-13: qty 50

## 2022-10-13 MED ORDER — IOHEXOL 300 MG/ML  SOLN
100.0000 mL | Freq: Once | INTRAMUSCULAR | Status: AC | PRN
  Administered 2022-10-13: 100 mL via INTRAVENOUS

## 2022-10-13 NOTE — Consult Note (Signed)
CODE SEPSIS - PHARMACY COMMUNICATION  **Broad Spectrum Antibiotics should be administered within 1 hour of Sepsis diagnosis**  Time Code Sepsis Called/Page Received: 1620  Antibiotics Ordered: zosyn  Time of 1st antibiotic administration: 7939  Additional action taken by pharmacy: none  If necessary, Name of Provider/Nurse Contacted: n/a    Pearla Dubonnet ,PharmD Clinical Pharmacist  10/13/2022  4:54 PM

## 2022-10-13 NOTE — ED Notes (Signed)
Images powershared to [place]

## 2022-10-13 NOTE — ED Triage Notes (Signed)
Pt BIBA from home. Pt has hx rectal cancer. Pt had last surgery in the fall. Since the surgery, pt has reoccurring cysts near site. Pt states that he usually gets tx to [facility] to drain. Pt is unable to sit on his bottom.

## 2022-10-13 NOTE — ED Notes (Signed)
Called [facility]  Maudie Mercury) for transport. They have 3 others in front of this pt.

## 2022-10-13 NOTE — ED Provider Notes (Signed)
[facility] Provider Note    Event Date/Time   First MD Initiated Contact with Patient 10/13/22 1607     (approximate)   History   Rectal Problems (Rectal CA patient (s/p APR) presents with reoccuring rectal abscess; Reports that he first noticed it yesterday morning and it has progressed to the point that is it causing him pain; He also has non-productive cough that began on Friday; Febrile in triage (102.4), Tylenol 1,000 mg was given)   HPI  Barry [name]. is a 57 y.o. male has medical history of locally advanced rectal cancer stage B status post APR who presents with rectal and abdominal pain.  Symptoms started on Saturday.  Worsened yesterday and now he is having worsening pain to the point he is unable to sit on his bottom.  Feels like the rectal area feels hot has not had any drainage.  Denies fever at home.  Is also having some lower abdominal cramping and has burning with urination.  He has had mild cough no dyspnea.  Denies headache body aches no sick contacts.  Reviewed general surgery consult note from 09/11/2022 in the [place] ED  - 12/08/21: Readmitted to [facility] for pelvic fluid collection (grew MSSA, mixed GPs, and Candida). Treated with antibiotics and an IR-placed drain. - 01/27/22: Readmitted to [facility] for recurrence (grew MSSA and GBS). Treated with antibiotics and an IR-placed drain. - 02/25/22: Seen at OSH ED for abdominal cramping. His abscess was found to be improving, and he was discharged in stable condition. - 06/27/22: IR drain placed, removed in [facility] clinic on 07/09/2022   CT at that time showed a 2.8 x 2.2 cm rim-enhancing collection in the surgical bed that was decreased in size from October.  This was suspicious for local recurrence.  He was treated with Augmentin.  Follows up with Dr. Tasia Catchings collagen who ordered an MRI of the pelvis that showed a resolving fluid collection.    Past Medical History:  Diagnosis Date   Headache    Left shoulder  pain    Rectal cancer [facility])     Patient Active Problem List   Diagnosis Date Noted   Perineal abscess 09/22/2022   Hypokalemia 06/09/2022   Former smoker 06/09/2022   Lung nodule 03/05/2022   Port-A-Cath in place 03/05/2022   Encounter for antineoplastic chemotherapy 03/18/2021   Sepsis ([place])    Colon perforation (HCC)    BRBPR (bright red blood per rectum) 02/14/2021   Rectal cancer ([place]) 02/08/2021   Goals of care, counseling/discussion 02/08/2021   Iron deficiency anemia due to chronic blood loss 02/08/2021   Proctitis    Constipation by outlet obstruction 02/05/2021   Nicotine dependence with withdrawal 02/05/2021   Chronic blood loss anemia 02/05/2021   Postural dizziness with presyncope 02/05/2021   Bowel wall thickening 02/05/2021   Hyponatremia 02/05/2021   WEIGHT LOSS 10/25/2010     Physical Exam  Triage Vital Signs: ED Triage Vitals  Enc Vitals Group     BP 10/13/22 1412 136/81     Pulse Rate 10/13/22 1412 (!) 115     Resp 10/13/22 1412 20     Temp 10/13/22 1358 (!) 102.4 F (39.1 C)     Temp src --      SpO2 10/13/22 1412 98 %     Weight 10/13/22 1412 167 lb (75.8 kg)     Height 10/13/22 1412 '5\' 8"'$  (1.727 m)     Head Circumference --  Peak Flow --      Pain Score 10/13/22 1435 10     Pain Loc --      Pain Edu? --      Excl. in [place]? --     Most recent vital signs: Vitals:   10/13/22 1412 10/13/22 1719  BP: 136/81 135/88  Pulse: (!) 115 99  Resp: 20 17  Temp:  (!) 100.7 F (38.2 C)  SpO2: 98% 97%     General: Awake, no distress.  CV:  Good peripheral perfusion.  Resp:  Normal effort.  Abd:  No distention.  Ostomy in place with formed stool, abdomen is soft nontender Neuro:             Awake, Alert, Oriented x 3  Other:  No edema or fistula or drainage in the perirectal area, it is diffusely tender and there is some fluctuance in the left gluteal fold Port-A-Cath in place in the right chest wall no surrounding erythema  ED Results /  Procedures / Treatments  Labs (all labs ordered are listed, but only abnormal results are displayed) Labs Reviewed  COMPREHENSIVE METABOLIC PANEL - Abnormal; Notable for the following components:      Result Value   Sodium 133 (*)    Calcium 8.2 (*)    All other components within normal limits  CBC WITH DIFFERENTIAL/PLATELET - Abnormal; Notable for the following components:   WBC 13.8 (*)    RBC 4.07 (*)    Hemoglobin 11.6 (*)    HCT 35.1 (*)    RDW 18.3 (*)    Neutro Abs 11.2 (*)    Monocytes Absolute 1.1 (*)    All other components within normal limits  URINALYSIS, ROUTINE W REFLEX MICROSCOPIC - Abnormal; Notable for the following components:   Color, Urine YELLOW (*)    APPearance CLEAR (*)    Protein, ur 30 (*)    All other components within normal limits  RESP PANEL BY RT-PCR (RSV, FLU A&B, COVID)  RVPGX2  CULTURE, BLOOD (ROUTINE X 2)  CULTURE, BLOOD (ROUTINE X 2)  LACTIC ACID, PLASMA  PROTIME-INR  APTT  PROCALCITONIN     EKG     RADIOLOGY I reviewed and interpreted the CT of the abdomen pelvis which shows fluid collection in the pelvic region   PROCEDURES:  Critical Care performed: Yes, see critical care procedure note(s)  .1-3 Lead EKG Interpretation  Performed by: Rada Hay, MD Authorized by: Rada Hay, MD     Interpretation: abnormal     ECG rate assessment: tachycardic     Rhythm: sinus tachycardia     Ectopy: none     Conduction: normal   .Critical Care  Performed by: Rada Hay, MD Authorized by: Rada Hay, MD   Critical care provider statement:    Critical care time (minutes):  30   Critical care was time spent personally by me on the following activities:  Development of treatment plan with patient or surrogate, discussions with consultants, evaluation of patient's response to treatment, examination of patient, ordering and review of laboratory studies, ordering and review of radiographic studies, ordering  and performing treatments and interventions, pulse oximetry, re-evaluation of patient's condition and review of old charts   The patient is on the cardiac monitor to evaluate for evidence of arrhythmia and/or significant heart rate changes.   MEDICATIONS ORDERED IN ED: Medications  lactated ringers infusion ( Intravenous New Bag/Given 10/13/22 1705)  acetaminophen (TYLENOL) tablet 1,000 mg (1,000 mg  Oral Given 10/13/22 1407)  lactated ringers bolus 1,000 mL (0 mLs Intravenous Stopped 10/13/22 1804)  piperacillin-tazobactam (ZOSYN) IVPB 3.375 g (0 g Intravenous Stopped 10/13/22 1735)  iohexol (OMNIPAQUE) 300 MG/ML solution 100 mL (100 mLs Intravenous Contrast Given 10/13/22 1731)  HYDROmorphone (DILAUDID) injection 1 mg (1 mg Intravenous Given 10/13/22 1749)     IMPRESSION / MDM / ASSESSMENT AND PLAN / ED COURSE  I reviewed the triage vital signs and the nursing notes.                              Patient's presentation is most consistent with acute presentation with potential threat to life or bodily function.  Differential diagnosis includes, but is not limited to, sepsis secondary to pelvic infection, urosepsis, viral illness, pneumonia, bacteremia  The patient is a 57 year old male with history of rectal cancer status post APR complicated by multiple postoperative pelvic infections presents today with worsening pelvic pain fever and abdominal pain.  Arrival he is febrile to 102.4 tachycardic to the 1 teens.  Looks nontoxic.  Has increasing rectal pain since Saturday, 2 days ago.  He has not had no drainage also complains of some lower abdominal cramping and burning with urination.  On exam there is no obvious cellulitis or superficial abscess or draining fistula but the perirectal area is tender and questionably fluctuant.  Lung sounds are clear abdominal exam is benign.  Overall I am concerned about a pelvic infection primarily.  Will start Zosyn and obtain CT abdomen pelvis as well as full  septic workup.  Patient's labs notable for leukocytosis to 13.8, negative lactate.  Urinalysis not consistent with UTI.    CT of the abdomen pelvis shows persistent fluid collection which is not quite different from when he was here in October.  There is also thickening of the bladder which is concerning for possible colovesicular fistula.  Patient is having urinary symptoms but his urinalysis is remarkably normal with no white cells.  Given patient's fever with worsening rectal pain I do think this is concerning for source of infection.  Will discuss with [name].  I do think patient would benefit from transfer.  Patient is excepted for transfer to ED.  Accepting physician is colorectal surgeon Marice Potter. FINAL CLINICAL IMPRESSION(S) / ED DIAGNOSES   Final diagnoses:  Perirectal abscess  Sepsis, due to unspecified organism, unspecified whether acute organ dysfunction present [facility])     Rx / DC Orders   ED Discharge Orders     None        Note:  This document was prepared using Dragon voice recognition software and may include unintentional dictation errors.   Rada Hay, MD 10/13/22 2005

## 2022-10-13 NOTE — ED Notes (Signed)
Pt accepted to [place] ED by Marice Potter, MD

## 2022-10-13 NOTE — ED Notes (Signed)
Called [facility] for transport

## 2022-10-13 NOTE — ED Notes (Signed)
Called [name] transport Mitzi Hansen) for ED to ED transport to [place]. It will multiple hours for transport.

## 2022-10-13 NOTE — Sepsis Progress Note (Signed)
Code Sepsis being monitored by eLink

## 2022-10-13 NOTE — ED Notes (Signed)
Call made for EMTALA check: RN assigned to complete EMTALA as well as transport signature prior to pt leaving. Assigned RN contacted by Probation officer to complete.

## 2022-10-13 NOTE — ED Notes (Signed)
Called [name] transfer for update Barry [name]) . Will repage MD , waiting to hear back .

## 2022-10-13 NOTE — ED Notes (Signed)
Called [facility] for transfer spoke to [place]

## 2022-10-13 NOTE — ED Triage Notes (Signed)
Rectal CA patient (s/p APR) presents with reoccuring rectal abscess; Reports that he first noticed it yesterday morning and it has progressed to the point that is it causing him pain; He also has non-productive cough that began on Friday; Febrile in triage (102.4), Tylenol 1,000 mg was given

## 2022-10-14 DIAGNOSIS — R188 Other ascites: Secondary | ICD-10-CM | POA: Diagnosis not present

## 2022-10-14 DIAGNOSIS — N3289 Other specified disorders of bladder: Secondary | ICD-10-CM | POA: Diagnosis not present

## 2022-10-14 DIAGNOSIS — Z9221 Personal history of antineoplastic chemotherapy: Secondary | ICD-10-CM | POA: Diagnosis not present

## 2022-10-14 DIAGNOSIS — B954 Other streptococcus as the cause of diseases classified elsewhere: Secondary | ICD-10-CM | POA: Diagnosis not present

## 2022-10-14 DIAGNOSIS — C763 Malignant neoplasm of pelvis: Secondary | ICD-10-CM | POA: Diagnosis not present

## 2022-10-14 DIAGNOSIS — Z85048 Personal history of other malignant neoplasm of rectum, rectosigmoid junction, and anus: Secondary | ICD-10-CM | POA: Diagnosis not present

## 2022-10-14 DIAGNOSIS — R509 Fever, unspecified: Secondary | ICD-10-CM | POA: Diagnosis not present

## 2022-10-14 DIAGNOSIS — Z91013 Allergy to seafood: Secondary | ICD-10-CM | POA: Diagnosis not present

## 2022-10-14 DIAGNOSIS — Z923 Personal history of irradiation: Secondary | ICD-10-CM | POA: Diagnosis not present

## 2022-10-14 DIAGNOSIS — K629 Disease of anus and rectum, unspecified: Secondary | ICD-10-CM | POA: Diagnosis not present

## 2022-10-16 ENCOUNTER — Telehealth: Payer: Self-pay | Admitting: *Deleted

## 2022-10-16 DIAGNOSIS — C20 Malignant neoplasm of rectum: Secondary | ICD-10-CM

## 2022-10-16 NOTE — Telephone Encounter (Addendum)
Patient is being discharged from [facility] and needs an appointment asap for recurrence of disease She said he will need pain management and palliative chemotherapy treatment, Please return her call re appointment

## 2022-10-16 NOTE — Telephone Encounter (Signed)
Message sent to his colorectal surgeon, Dr. Hester Mates to call you. Dr. Hester Mates performed original surgery and followed him during recent hospitalization.

## 2022-10-18 LAB — CULTURE, BLOOD (ROUTINE X 2)
Culture: NO GROWTH
Culture: NO GROWTH
Special Requests: ADEQUATE

## 2022-10-20 DIAGNOSIS — Z91013 Allergy to seafood: Secondary | ICD-10-CM | POA: Diagnosis not present

## 2022-10-20 DIAGNOSIS — Z87891 Personal history of nicotine dependence: Secondary | ICD-10-CM | POA: Diagnosis not present

## 2022-10-20 DIAGNOSIS — M869 Osteomyelitis, unspecified: Secondary | ICD-10-CM | POA: Diagnosis not present

## 2022-10-20 DIAGNOSIS — Z933 Colostomy status: Secondary | ICD-10-CM | POA: Diagnosis not present

## 2022-10-20 DIAGNOSIS — Z809 Family history of malignant neoplasm, unspecified: Secondary | ICD-10-CM | POA: Diagnosis not present

## 2022-10-20 DIAGNOSIS — Z85038 Personal history of other malignant neoplasm of large intestine: Secondary | ICD-10-CM | POA: Diagnosis not present

## 2022-10-20 DIAGNOSIS — Z8249 Family history of ischemic heart disease and other diseases of the circulatory system: Secondary | ICD-10-CM | POA: Diagnosis not present

## 2022-10-20 DIAGNOSIS — Z833 Family history of diabetes mellitus: Secondary | ICD-10-CM | POA: Diagnosis not present

## 2022-10-22 ENCOUNTER — Encounter: Payer: Self-pay | Admitting: Oncology

## 2022-10-22 NOTE — Telephone Encounter (Addendum)
Please schedule and inform pt of appt:  Lab/ MD / palliative one day next week . -cbc cmp CEA

## 2022-10-22 NOTE — Telephone Encounter (Signed)
Spoke to Dr. Zollie Scale office assistance and provided her with Dr. Collie Siad number for a callback

## 2022-10-24 NOTE — Telephone Encounter (Signed)
Per Dr. Tasia Catchings, please check if pt can come on Monday 2/19, instead. Ok to use chemo/ NP slot.

## 2022-10-27 ENCOUNTER — Inpatient Hospital Stay (HOSPITAL_BASED_OUTPATIENT_CLINIC_OR_DEPARTMENT_OTHER): Payer: 59 | Admitting: Hospice and Palliative Medicine

## 2022-10-27 ENCOUNTER — Telehealth: Payer: Self-pay

## 2022-10-27 ENCOUNTER — Inpatient Hospital Stay: Payer: 59 | Attending: Oncology

## 2022-10-27 ENCOUNTER — Encounter: Payer: Self-pay | Admitting: Oncology

## 2022-10-27 ENCOUNTER — Other Ambulatory Visit: Payer: Self-pay

## 2022-10-27 ENCOUNTER — Inpatient Hospital Stay (HOSPITAL_BASED_OUTPATIENT_CLINIC_OR_DEPARTMENT_OTHER): Payer: 59 | Admitting: Oncology

## 2022-10-27 VITALS — BP 141/91 | HR 89 | Temp 96.6°F | Wt 157.2 lb

## 2022-10-27 DIAGNOSIS — R1909 Other intra-abdominal and pelvic swelling, mass and lump: Secondary | ICD-10-CM

## 2022-10-27 DIAGNOSIS — Z923 Personal history of irradiation: Secondary | ICD-10-CM | POA: Diagnosis not present

## 2022-10-27 DIAGNOSIS — F17210 Nicotine dependence, cigarettes, uncomplicated: Secondary | ICD-10-CM | POA: Insufficient documentation

## 2022-10-27 DIAGNOSIS — Z9221 Personal history of antineoplastic chemotherapy: Secondary | ICD-10-CM | POA: Diagnosis not present

## 2022-10-27 DIAGNOSIS — F32A Depression, unspecified: Secondary | ICD-10-CM | POA: Diagnosis not present

## 2022-10-27 DIAGNOSIS — Z85048 Personal history of other malignant neoplasm of rectum, rectosigmoid junction, and anus: Secondary | ICD-10-CM | POA: Insufficient documentation

## 2022-10-27 DIAGNOSIS — R911 Solitary pulmonary nodule: Secondary | ICD-10-CM | POA: Diagnosis not present

## 2022-10-27 DIAGNOSIS — Z95828 Presence of other vascular implants and grafts: Secondary | ICD-10-CM

## 2022-10-27 DIAGNOSIS — C20 Malignant neoplasm of rectum: Secondary | ICD-10-CM

## 2022-10-27 DIAGNOSIS — G4700 Insomnia, unspecified: Secondary | ICD-10-CM

## 2022-10-27 DIAGNOSIS — F419 Anxiety disorder, unspecified: Secondary | ICD-10-CM

## 2022-10-27 DIAGNOSIS — L02215 Cutaneous abscess of perineum: Secondary | ICD-10-CM

## 2022-10-27 DIAGNOSIS — Z933 Colostomy status: Secondary | ICD-10-CM | POA: Insufficient documentation

## 2022-10-27 DIAGNOSIS — Z79899 Other long term (current) drug therapy: Secondary | ICD-10-CM | POA: Insufficient documentation

## 2022-10-27 LAB — COMPREHENSIVE METABOLIC PANEL
ALT: 16 U/L (ref 0–44)
AST: 20 U/L (ref 15–41)
Albumin: 3.6 g/dL (ref 3.5–5.0)
Alkaline Phosphatase: 87 U/L (ref 38–126)
Anion gap: 7 (ref 5–15)
BUN: 11 mg/dL (ref 6–20)
CO2: 28 mmol/L (ref 22–32)
Calcium: 8.8 mg/dL — ABNORMAL LOW (ref 8.9–10.3)
Chloride: 104 mmol/L (ref 98–111)
Creatinine, Ser: 0.91 mg/dL (ref 0.61–1.24)
GFR, Estimated: >60 mL/min (ref >60)
Glucose, Bld: 110 mg/dL — ABNORMAL HIGH (ref 70–99)
Potassium: 3.8 mmol/L (ref 3.5–5.1)
Sodium: 139 mmol/L (ref 135–145)
Total Bilirubin: 0.4 mg/dL (ref 0.3–1.2)
Total Protein: 8.2 g/dL — ABNORMAL HIGH (ref 6.5–8.1)

## 2022-10-27 LAB — CBC WITH DIFFERENTIAL/PLATELET
Abs Immature Granulocytes: 0.02 10*3/uL (ref 0.00–0.07)
Basophils Absolute: 0 10*3/uL (ref 0.0–0.1)
Basophils Relative: 1 %
Eosinophils Absolute: 0.2 10*3/uL (ref 0.0–0.5)
Eosinophils Relative: 4 %
HCT: 39.3 % (ref 39.0–52.0)
Hemoglobin: 13.1 g/dL (ref 13.0–17.0)
Immature Granulocytes: 0 %
Lymphocytes Relative: 34 %
Lymphs Abs: 1.8 10*3/uL (ref 0.7–4.0)
MCH: 28.7 pg (ref 26.0–34.0)
MCHC: 33.3 g/dL (ref 30.0–36.0)
MCV: 86 fL (ref 80.0–100.0)
Monocytes Absolute: 0.7 10*3/uL (ref 0.1–1.0)
Monocytes Relative: 13 %
Neutro Abs: 2.4 10*3/uL (ref 1.7–7.7)
Neutrophils Relative %: 48 %
Platelets: 361 10*3/uL (ref 150–400)
RBC: 4.57 MIL/uL (ref 4.22–5.81)
RDW: 18.5 % — ABNORMAL HIGH (ref 11.5–15.5)
WBC: 5.1 10*3/uL (ref 4.0–10.5)
nRBC: 0 % (ref 0.0–0.2)

## 2022-10-27 MED ORDER — CITALOPRAM HYDROBROMIDE 10 MG PO TABS
10.0000 mg | ORAL_TABLET | Freq: Every day | ORAL | 3 refills | Status: AC
  Filled 2022-10-27: qty 30, 30d supply, fill #0

## 2022-10-27 MED ORDER — TRAZODONE HCL 50 MG PO TABS
50.0000 mg | ORAL_TABLET | Freq: Every evening | ORAL | 3 refills | Status: AC | PRN
  Filled 2022-10-27: qty 30, 30d supply, fill #0

## 2022-10-27 NOTE — Assessment & Plan Note (Signed)
locally advanced rectal cancer-Stage IIIB Status postTNT neoadjuvant protocol, concurrent Xeloda and radiation-Finished 08/27/2021.. Status post APR,Pathology showed rectal adenocarcinoma, [id number]. Positive radial margin due to perforation. CT images and MRI images were reviewed and discussed with patient. Patient has had multiple episodes of recurrent intra-abdominal abscess.  Recent imaging is concerning for recurrent cancer.  There is also concern of colovesical fistula. I have discussed with this colorectal surgeon Dr. Hester Mates who recommends patient to get a biopsy locally.  Discussed patient's images at tumor board.  Consult IR for biopsy. Continue Augmentin and finish course.  Recommend patient to follow-up with ID at [facility].

## 2022-10-27 NOTE — Progress Notes (Signed)
Hematology/Oncology Progress note [phone] [phone]      Patient Care Team: Pcp, No as PCP - General [name], RN as Oncology Nurse Navigator   ASSESSMENT & PLAN:   Rectal cancer [facility]) locally advanced rectal cancer-Stage IIIB Status postTNT neoadjuvant protocol, concurrent Xeloda and radiation-Finished 08/27/2021.. Status post APR,Pathology showed rectal adenocarcinoma, ypT3N0. Positive radial margin due to perforation. CT images and MRI images were reviewed and discussed with patient. Patient has had multiple episodes of recurrent intra-abdominal abscess.  Recent imaging is concerning for recurrent cancer.  There is also concern of colovesical fistula. I have discussed with this colorectal surgeon Dr. Hester Mates who recommends patient to get a biopsy locally.  Discussed patient's images at tumor board.  Consult IR for biopsy. Continue Augmentin and finish course.  Recommend patient to follow-up with ID at [facility].    Lung nodule Previously discussed with IR, CT-guided biopsy is difficult due to the position of small size. Repeat CT chest shows stable lung nodules.  Perineal abscess Recurrent abscess.  Concerning of local recurrence.  See above plan.  Port-A-Cath in place Continue port flush every 8 weeks.  Orders Placed This Encounter  Procedures   CT ABDOMINAL MASS BIOPSY    Standing Status:   Future    Standing Expiration Date:   10/27/2023    Order Specific Question:   Lab orders requested (DO NOT place separate lab orders, these will be automatically ordered during procedure specimen collection):    Answer:   Surgical Pathology    Order Specific Question:   Reason for Exam (SYMPTOM  OR DIAGNOSIS REQUIRED)    Answer:   presacral mass    Order Specific Question:   Preferred location?    Answer:   [facility]    Follow-up TBD All questions were answered. The patient knows to call the clinic with any problems, questions or  concerns.  Earlie Server, MD, PhD [facility] Hematology Oncology 10/27/2022       CHIEF COMPLAINTS/REASON FOR VISIT:  Follow up for rectal cancer treatment  HISTORY OF PRESENTING ILLNESS:   Barry [name]. is a  57 y.o.  male presents for rectal cancer Oncology History  Rectal cancer ([place])  02/08/2021 Initial Diagnosis   Rectal cancer   02/05/2021-02/06/2021 patient was hospitalized due to generalized weakness, intermittent lightheadedness, weight loss and worsening constipation.  02/05/2021 CT abdomen showed concerning of severe rectal wall thickening and right internal iliac lymph node concerning for metastatic disease.  Patient was seen by gastroenterology and had colonoscopy which showed a circumferential fungating mass in the rectum.  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   02/14/2021-02/16/2021 hospitalized due to rectal bleeding and pain.   02/14/2021 CT showed perirectal fluid collection/gas concerning for infection with significant leukocytosis, anemia with hemoglobin of 6.8.  Patient received PRBC transfusion, IV antibiotics.  He underwent an IR guided placement of JP drain into the rectal abscess.  Discharged home with oral Augmentin. 02/20/2021 presented to ER with new skin opening and draining from left gluteus.  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  JP drain has been removed.  Patient was continued on Augmentin.   02/13/2021, PET scan showed locally advanced rectal cancer with billowing of the mesorectum now with low attenuation material that was not present on previous examination with extensive stranding and inflammation.   Bulky RIGHT pelvic sidewall/hypogastric lymph node outside of the mesorectum with stippled calcification measuring 19 mm-no increased metabolic activity. Small LEFT hypogastric lymph node just  peripheral to the internal external bifurcation-SUV 3.2 High RIGHT internal just below or at the internal/common iliac transition, lymph  node -SUV 4.8 LEFT high hypogastric lymph node  8 mm-SUV 3. Scattered lymph nodes throughout the retroperitoneum with low FDG uptake Bilateral inguinal lymph nodes largest on the RIGHT (image 248/3) 11 mm with a maximum SUV of 2.8 spiculated nodule in the LEFT upper lobe- 9 x 8 mm       02/14/2021, CT abdomen pelvis without contrast Showed perforated rectal mass with contained perforation with fluid and gas extending above and below the pelvic floor,potentially involving the sphincter complex and extending into LEFT ischial rectal fossa   02/20/2021, CT pelvis with contrast showed Large perirectal/perianal abscess has markedly decreased in size since placement of the percutaneous drain. There are residual gas-filled collections in the soft tissues and suspect there is a fistula or sinus tract between the rectal mass and the subcutaneous tissues. Soft tissue gas along the medial left buttock and concern for a cutaneous ulceration in this area. Large rectal mass with evidence for a large necrotic right pelvic lymph node.   His case is complicated with a perirectal abscess. Prior to onset of perirectal abscess, on his 02/05/2021 scan, he was noted to have right internal iliac lymph node 3 x 1 x 2.3 cm which is concerning for nodal disease.On Subsequent images it was difficult to distinguish whether lymphadenopathy was due to nodal disease versus acute inflammation. 03/07/2021 MRI pelvis cT3 N2. Due to the possible contained perforation on previous CT, possible cT4 disease.    02/27/2021 medi port placed by Dr.Dew 03/13/2021 reports right butt cheek and also left perianal area fullness.he was seen by Dr.Pabon urgently  and had right buttock abscess drained. Left perianal fullness was felt to be due to cancer.    02/08/2021 Cancer Staging   Staging form: Colon and Rectum, AJCC 8th Edition - Clinical stage from 02/08/2021: Stage IIIB (cT4a, cN1, cM0) - Signed by Earlie Server, MD on 03/06/2021 Stage prefix: Initial  diagnosis   02/27/2021 Procedure   medi port placed by Dr.Dew   03/18/2021 - 07/05/2021 Chemotherapy    FOLFOX q14d x 4 months      07/17/2021 - 08/27/2021 Chemotherapy   Xeloda concurrent with Radiation   11/18/2021 Surgery   patient is status post open APR with flap for rectal cancer. Pathology rectal adenomacarcinoma ypT3N0.  Positive margin: Radial (circumferential) or mesenteric: adjacent to perforation      02/25/2022 Imaging   CT chest angiogram with and without contrast, CT abdomen pelvis with and without contrast 1. Negative for acute pulmonary embolus.2. Emphysema. Further decrease in size of the previously noted irregular nodule in the left upper lobe which is now barely measurable today. No new suspicious lung nodules 3. Status post left lower quadrant colostomy. Further decrease in size since comparison exam from May of the rim enhancing gas and fluid collection at the pelvic surgical bed extending from the perineum superiorly into the pelvis 4. Slightly thickened appearance of terminal ileal small bowel loops in the pelvis with mild stranding suggesting small bowel inflammatory process. 5. Stable enlarged right pelvic sidewall lymph node   06/02/2022 Imaging   CT chest abdomen pelvis w contrast 1. Status post abdominal perineal resection with descending colostomy. 2. At the site of pelvic fluid and gas collection on 02/25/2022, there is residual, decreased presacral soft tissue fullness, without drainable collection. 3. Similar right obturator nodal metastasis.4.  No acute process or evidence of metastatic disease in  the chest. 5. Age advanced coronary artery atherosclerosis. Recommend assessment of coronary risk factors. 6. Aortic atherosclerosis and emphysema     06/26/2022 Imaging   CT abdomen pelvis with contrast showed 1. Interval increase in size of a lobulated partially imaged at least 7.8 x 3.8 x 12 cm abscess in the perianal region in a patient status post  abdominal perineal resection and left lower lobe end colostomy formation. Finding extends to involve the pre sacral region up to the urinary dome level. Associated posterior urinary bladder wall thickening with lack of intraperitoneal fat plane between the presacral soft tissue thickening/abscess formation suggestive of possible fistulization and invasion of the posterior bladder wall. Underlying recurrent malignancy is not excluded. 2. Stable 2.7 cm right pelvic sidewall lymph node. 3.  Aortic Atherosclerosis   06/26/2022 - 06/27/2022 Hospital Admission   He went to [facility] ER and CT showed recurrent abscess. He was transferred to [facility] due to recurrent abscess, treated with IV vanc/zosyn, IR was consulted and drainage tube was placed. Wound grew up Strep viridans. Patient was seen by ID at [facility] discharged with Augmentin for 2 weeks.   He was seen by wound care Dr> Parke Poisson on 07/09/2022, JP drain was removed.    09/29/2022 Imaging   MRI pelvis without contrast showed  Interval abdominoperitoneal resection since prior MRI. Decreased small fluid collection in the surgical bed compared with more recent CT, consistent with resolving postoperative fluid collection or abscess.   Bulky rounded presacral mass, which shows diffuse restricted diffusion, without significant change in size since most recent CT of 06/26/2022. This raises suspicion for recurrent carcinoma over post treatment changes. Recommend correlation with CEA level, and consider tissue sampling or PET-CT.   Stable enlarged right pelvic sidewall lymph node. No new or increased adenopathy identified.   10/09/2022 Imaging   CT chest with contrast showed 1. Unchanged 0.4 cm fissural nodule of the anterior left lower lobe. This is almost certainly a benign fissural lymph node. No new or suspicious pulmonary nodules. 2. Moderate emphysema and diffuse bilateral bronchial wall thickening. 3. Coronary artery disease.   10/13/2022 Imaging   CT  abdomen pelvis with contrast showed 1. Complex collection persists in the lower pelvis, extending from the anal verge upwards into the presacral space and anteriorly from the presacral space to the bladder dome, not significantly changed in size or extent compared to the earlier CT of 06/26/2022. This is most likely a combination of a postoperative seroma and/or chronic phlegmon/abscess versus recurrent abscess. 2. Bladder walls are thick walled/edematous. Given the contiguity of the presacral collection and the bladder dome, this is highly suspicious for a related bladder wall infection and possibly secondary to colovesical fistula. Recommend correlation with urinalysis. 3. No evidence of bowel obstruction. LEFT lower abdominal wall colostomy, without obstruction or inflammatory change. 4. No free intraperitoneal air   10/13/2022 [facility] Admission   Hospitalized due to fever and rectal pain. Urine culture showed mixed urogenital flora. IR CT guided Aspiration of abscess showed Streptococcal Viridans. He was placed back on Augmentin  10/14/2023 CT read by [facility] radiology Postsurgical changes of abdominoperineal resection. Interval enlargement of  fluid collection in the surgical bed. Multiple foci of enhancing tissue at  the margins of this fluid collection, increased from prior study, suspicious for local recurrence.   Prominent retroperitoneal lymph nodes, some which are increased in size from prior study. These are indeterminate and may be either reactive or represent metastatic disease. Recommend continued attention on follow-up.  Centrally hypoattenuating right obturator lymph node, unchanged in size, consistent with treated disease.   Circumferential bladder wall thickening and irregularity, most pronounced on the left side. This may be reactive in the setting of adjacent pelvic inflammatory changes. Correlate with urinalysis if there is clinical  concern for urinary tract infection.       Patient presented to emergency room on 12/08/21, rectal pain.  CT scan showed 18.3 cm fluid and gas collection in the pelvic surgical bed compatible with infected collection/abscess.  Patient was sent to [facility] and admitted for.  Patient has CT-guided drain placement on 12/09/2021.  Blood culture positive for Staph epidermidis which was felt to be contaminant.  Fluid culture grew pansensitive Staph aureus, strep pyogenes and candida albicans.  Patient was treated with IV antibiotics and transition to p.o. Augmentin and fluconazole on 12/11/2021.  #Perirectal abscess status post drainage catheter , patient was recently seen by [facility] oncology surgeon on 12/24/2021 and was recommended to keep drainage catheter and continue antibiotics. Patient had a repeat CT scan done which showed a smaller but persistent abscess.  He will return to [facility] surgery for follow-up of drain removal. His case was discussed on [name] tumor board for the positive mesenteric margin which was felt to be secondary to previous perforation.  Recommend surveillance.  INTERVAL HISTORY Lynton Yatsko. is a 57 y.o. male who has above history reviewed by me today presents for follow up visit for management of rectal cancer. During interval, patient was hospitalized at [facility] due to recurrent abscess.  He is currently taking Augmentin.  Rectal pain has improved.  Denies any fever he has minimal active drainage. Patient reports that he is currently stressed due to being separated with his wife.  He has not followed up with colorectal surgeon after his last visit with me.   Review of Systems  Constitutional:  Negative for appetite change, chills, fatigue, fever and unexpected weight change.  HENT:   Negative for hearing loss and voice change.   Eyes:  Negative for eye problems and icterus.  Respiratory:  Negative for chest tightness, cough and shortness of breath.   Cardiovascular:  Negative for chest pain and leg swelling.  Gastrointestinal:   Positive for rectal pain. Negative for abdominal distention, abdominal pain, blood in stool, constipation and diarrhea.       Rectal drainage  Endocrine: Negative for hot flashes.  Genitourinary:  Negative for difficulty urinating, dysuria and frequency.   Musculoskeletal:  Negative for arthralgias.  Skin:  Negative for itching and rash.  Neurological:  Negative for light-headedness and numbness.  Hematological:  Negative for adenopathy. Does not bruise/bleed easily.  Psychiatric/Behavioral:  Negative for confusion.     MEDICAL HISTORY:  Past Medical History:  Diagnosis Date   Headache    Left shoulder pain    Rectal cancer ([place])     SURGICAL HISTORY: Past Surgical History:  Procedure Laterality Date   COLON SURGERY     COLONOSCOPY WITH PROPOFOL N/A 02/06/2021   Procedure: COLONOSCOPY WITH PROPOFOL;  Surgeon: Lin Landsman, MD;  Location: [facility] ENDOSCOPY;  Service: Gastroenterology;  Laterality: N/A;   PORTA CATH INSERTION N/A 02/27/2021   Procedure: PORTA CATH INSERTION;  Surgeon: Algernon Huxley, MD;  Location: [facility];  Service: Cardiovascular;  Laterality: N/A;   ROTATOR CUFF REPAIR Left     SOCIAL HISTORY: Social History   Socioeconomic History   Marital status: Married    Spouse name: Not on file   Number  of children: Not on file   Years of education: Not on file   Highest education level: Not on file  Occupational History   Not on file  Tobacco Use   Smoking status: Former    Packs/day: 1.00    Years: 10.00    Total pack years: 10.00    Types: Cigars, Cigarettes    Quit date: 02/04/2021    Years since quitting: 1.7   Smokeless tobacco: Never  Substance and Sexual Activity   Alcohol use: Not Currently   Drug use: No   Sexual activity: Not on file  Other Topics Concern   Not on file  Social History Narrative   Not on file   Social Determinants of Health   Financial Resource Strain: Not on file  Food Insecurity: Not on file   Transportation Needs: Not on file  Physical Activity: Not on file  Stress: Not on file  Social Connections: Not on file  Intimate Partner Violence: Not on file    FAMILY HISTORY: Family History  Problem Relation Age of Onset   Cancer Sister    Diabetes Mother    Cancer Maternal Grandmother    Cancer Paternal Grandmother     ALLERGIES:  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  MEDICATIONS:  Current Outpatient Medications  Medication Sig Dispense Refill   amoxicillin-clavulanate (AUGMENTIN) 875-125 MG tablet Take by mouth.     lidocaine-prilocaine (EMLA) cream Apply 1 Application topically as needed. Apply to port and cover with saran wrap 1-2 hours prior to port access 30 g 2   nicotine (NICODERM CQ - DOSED IN MG/24 HOURS) 14 mg/24hr patch Place 1 patch (14 mg total) onto the skin daily. 28 patch 2   citalopram (CELEXA) 10 MG tablet Take 1 tablet (10 mg total) by mouth daily. 30 tablet 3   feeding supplement (ENSURE ENLIVE / ENSURE PLUS) LIQD Take 237 mLs by mouth daily. (Patient not taking: Reported on 10/27/2022) 237 mL 12   nicotine (NICODERM CQ - DOSED IN MG/24 HR) 7 mg/24hr patch Place 1 patch (7 mg total) onto the skin daily. BYRD fund to pay (Patient not taking: Reported on 09/22/2022) 28 patch 2   potassium chloride SA (KLOR-CON M) 20 MEQ tablet Take 1 tablet (20 mEq total) by mouth daily. BYRD fund to pay (Patient not taking: Reported on 09/22/2022) 3 tablet 0   traZODone (DESYREL) 50 MG tablet Take 1 tablet (50 mg total) by mouth at bedtime as needed for sleep. 30 tablet 3   No current facility-administered medications for this visit.     PHYSICAL EXAMINATION: ECOG PERFORMANCE STATUS: 1 - Symptomatic but completely ambulatory Vitals:   10/27/22 0906  BP: (!) 141/91  Pulse: 89  Temp: (!) 96.6 F (35.9 C)  SpO2: 100%     Filed Weights   10/27/22 0906  Weight: 157 lb 3.2 oz (71.3 kg)     Physical Exam HENT:     Head: Normocephalic and atraumatic.  Eyes:      General: No scleral icterus. Cardiovascular:     Rate and Rhythm: Normal rate and regular rhythm.     Heart sounds: Normal heart sounds.  Pulmonary:     Effort: Pulmonary effort is normal. No respiratory distress.     Breath sounds: No wheezing.     Comments: Decreased breath sound bilaterally.  Abdominal:     General: Bowel sounds are normal. There is no distension.     Palpations: Abdomen is soft.     Comments: Colostomy  bag  Genitourinary:    Comments: Status post APR, Musculoskeletal:        General: No deformity. Normal range of motion.     Cervical back: Normal range of motion and neck supple.  Skin:    General: Skin is warm and dry.     Findings: No erythema or rash.  Neurological:     Mental Status: He is alert and oriented to person, place, and time. Mental status is at baseline.     Cranial Nerves: No cranial nerve deficit.     Coordination: Coordination normal.  Psychiatric:        Mood and Affect: Mood normal.     LABORATORY DATA:  I have reviewed the data as listed    Latest Ref Rng & Units 10/27/2022    8:40 AM 10/13/2022    2:39 PM 09/04/2022    9:31 AM  CBC  WBC 4.0 - 10.5 K/uL 5.1  13.8  6.5   Hemoglobin 13.0 - 17.0 g/dL 13.1  11.6  12.5   Hematocrit 39.0 - 52.0 % 39.3  35.1  37.0   Platelets 150 - 400 K/uL 361  298  529       Latest Ref Rng & Units 10/27/2022    8:40 AM 10/13/2022    2:39 PM 09/04/2022    9:31 AM  CMP  Glucose 70 - 99 mg/dL 110  94  117   BUN 6 - 20 mg/dL 11  10  11   $ Creatinine 0.61 - 1.24 mg/dL 0.91  0.82  1.04   Sodium 135 - 145 mmol/L 139  133  142   Potassium 3.5 - 5.1 mmol/L 3.8  3.8  4.1   Chloride 98 - 111 mmol/L 104  101  108   CO2 22 - 32 mmol/L 28  24  28   $ Calcium 8.9 - 10.3 mg/dL 8.8  8.2  9.0   Total Protein 6.5 - 8.1 g/dL 8.2  8.0  8.3   Total Bilirubin 0.3 - 1.2 mg/dL 0.4  0.6  0.3   Alkaline Phos 38 - 126 U/L 87  87  80   AST 15 - 41 U/L 20  20  19   $ ALT 0 - 44 U/L 16  14  19      $ RADIOGRAPHIC STUDIES: I  have personally reviewed the radiological images as listed and agreed with the findings in the report. CT ABDOMEN PELVIS W CONTRAST  Result Date: 10/13/2022 CLINICAL DATA:  Abdominal pain, postop rectal pain. EXAM: CT ABDOMEN AND PELVIS WITH CONTRAST TECHNIQUE: Multidetector CT imaging of the abdomen and pelvis was performed using the standard protocol following bolus administration of intravenous contrast. RADIATION DOSE REDUCTION: This exam was performed according to the departmental dose-optimization program which includes automated exposure control, adjustment of the mA and/or kV according to patient size and/or use of iterative reconstruction technique. CONTRAST:  170m OMNIPAQUE IOHEXOL 300 MG/ML  SOLN COMPARISON:  CT abdomen dated 06/26/2022. FINDINGS: Lower chest: No acute abnormality. Hepatobiliary: No acute or suspicious findings within the liver. Gallbladder is unremarkable. No bile duct dilatation is seen. Pancreas: Unremarkable. No pancreatic ductal dilatation or surrounding inflammatory changes. Spleen: Normal in size without focal abnormality. Adrenals/Urinary Tract: Adrenal glands are unremarkable. Kidneys are unremarkable without suspicious mass, stone or hydronephrosis. Bladder walls are thick walled/edematous. Stomach/Bowel: No dilated large or small bowel loops. LEFT lower abdominal wall colostomy, without obstruction or inflammatory change. Vascular/Lymphatic: Aortic atherosclerosis. No acute-appearing vascular abnormality. Reproductive: History of prostatectomy. Other:  Complex hypodense collection extending from the anal verge inferiorly into the presacral space and anteriorly from the presacral space to the bladder dome, not significantly changed in size or extent compared to the earlier CT of 06/26/2022. Stable rounded fluid collection along the posterior portion of the RIGHT pelvic sidewall, not contiguous with the aforementioned fluid collection in the presacral space and therefore most  likely an unrelated postsurgical seroma. Musculoskeletal: No acute-appearing osseous abnormality. No evidence of osteomyelitis. IMPRESSION: 1. Complex collection persists in the lower pelvis, extending from the anal verge upwards into the presacral space and anteriorly from the presacral space to the bladder dome, not significantly changed in size or extent compared to the earlier CT of 06/26/2022. This is most likely a combination of a postoperative seroma and/or chronic phlegmon/abscess versus recurrent abscess. 2. Bladder walls are thick walled/edematous. Given the contiguity of the presacral collection and the bladder dome, this is highly suspicious for a related bladder wall infection and possibly secondary to colovesical fistula. Recommend correlation with urinalysis. 3. No evidence of bowel obstruction. LEFT lower abdominal wall colostomy, without obstruction or inflammatory change. 4. No free intraperitoneal air. Aortic Atherosclerosis (ICD10-I70.0). Electronically Signed   By: Franki Cabot M.D.   On: 10/13/2022 17:53   DG Chest Port 1 View  Result Date: 10/13/2022 CLINICAL DATA:  Sepsis EXAM: PORTABLE CHEST 1 VIEW COMPARISON:  06/26/2022, 10/09/2022 FINDINGS: Right IJ approach chest port remains in place. The heart size and mediastinal contours are within normal limits. Both lungs are clear. The visualized skeletal structures are unremarkable. IMPRESSION: No active disease. Electronically Signed   By: Davina Poke D.O.   On: 10/13/2022 16:47   CT Chest W Contrast  Result Date: 10/09/2022 CLINICAL DATA:  Rectal cancer, chest staging follow-up lung nodule * Tracking Code: BO * EXAM: CT CHEST WITH CONTRAST TECHNIQUE: Multidetector CT imaging of the chest was performed during intravenous contrast administration. RADIATION DOSE REDUCTION: This exam was performed according to the departmental dose-optimization program which includes automated exposure control, adjustment of the mA and/or kV according  to patient size and/or use of iterative reconstruction technique. CONTRAST:  103m OMNIPAQUE IOHEXOL 300 MG/ML  SOLN COMPARISON:  CT chest abdomen pelvis, 06/02/2022 FINDINGS: Cardiovascular: Right chest port catheter. Scattered aortic atherosclerosis. Normal heart size. Left coronary artery calcifications. No pericardial effusion. Mediastinum/Nodes: No enlarged mediastinal, hilar, or axillary lymph nodes. Thymic remnant in the anterior mediastinum. Thyroid gland, trachea, and esophagus demonstrate no significant findings. Lungs/Pleura: Moderate centrilobular emphysema. Diffuse bilateral bronchial wall thickening. Unchanged 0.4 cm fissural nodule of the anterior left lower lobe (series 3, image 83). No pleural effusion or pneumothorax. Upper Abdomen: No acute abnormality. Musculoskeletal: No chest wall abnormality. No acute osseous findings. IMPRESSION: 1. Unchanged 0.4 cm fissural nodule of the anterior left lower lobe. This is almost certainly a benign fissural lymph node. No new or suspicious pulmonary nodules. 2. Moderate emphysema and diffuse bilateral bronchial wall thickening. 3. Coronary artery disease. Aortic Atherosclerosis (ICD10-I70.0) and Emphysema (ICD10-J43.9). Electronically Signed   By: ADelanna AhmadiM.D.   On: 10/09/2022 14:21   MR Pelvis Wo Contrast  Result Date: 10/01/2022 CLINICAL DATA:  Follow-up locally advanced rectal carcinoma. Status post abdominoperitoneal resection, chemotherapy, and radiation therapy. EXAM: MRI PELVIS WITHOUT CONTRAST TECHNIQUE: Multiplanar multisequence MR imaging of the pelvis was performed. No intravenous contrast was administered. Ultrasound gel was administered per rectum to optimize tumor evaluation. COMPARISON:  MRI on 03/07/2021, and CT on 06/26/22 FINDINGS: Lower Urinary Tract: Unremarkable urinary bladder. Bowel: Patient has  undergone abdominoperitoneal resection since prior MRI. A small fluid collection is seen in the surgical bed measuring 1.7 x 1.2 cm,  significantly decreased from 7.3 x 4.0 cm on most recent CT. This is consistent with resolving postoperative fluid collection or abscess. A heterogeneous T2 hypointense presacral mass is seen. Comparison with prior preop MRI is difficult, but this does show considerable decrease in bulkiness since the original preop MRI. This currently measures 4.3 by 3.0 x 3.4 cm on images 14/8 and 30/9, and shows no significant change in size compared to recent CT on 06/26/2022. This mass does show diffuse restricted diffusion, and is relatively bulky in appearance. These features favor recurrent carcinoma over posttreatment changes J Vascular/Lymphatic: Enlarged pelvic sidewall lymph node measures 3.2 x 2.5 cm on image 9/8, without significant change since previous study. No new or increased lymphadenopathy identified. Reproductive:  Unremarkable prostate. Other: None. Musculoskeletal: No suspicious bone lesions identified. IMPRESSION: Interval abdominoperitoneal resection since prior MRI. Decreased small fluid collection in the surgical bed compared with more recent CT, consistent with resolving postoperative fluid collection or abscess. Bulky rounded presacral mass, which shows diffuse restricted diffusion, without significant change in size since most recent CT of 06/26/2022. This raises suspicion for recurrent carcinoma over post treatment changes. Recommend correlation with CEA level, and consider tissue sampling or PET-CT. Stable enlarged right pelvic sidewall lymph node. No new or increased adenopathy identified. Electronically Signed   By: Marlaine Hind M.D.   On: 10/01/2022 10:48

## 2022-10-27 NOTE — Telephone Encounter (Signed)
Pt scheduled for [facility] 2/26 @ 10:30a and arrive at 9:30. Pt aware of appts.

## 2022-10-27 NOTE — Assessment & Plan Note (Signed)
Previously discussed with IR, CT-guided biopsy is difficult due to the position of small size. Repeat CT chest shows stable lung nodules.

## 2022-10-27 NOTE — Telephone Encounter (Signed)
Request for CT biopsy of presacral mass has been axed to IR.

## 2022-10-27 NOTE — Progress Notes (Signed)
[place] at [facility] [phone] [phone]   Name: Barry [name]. Date: 10/27/2022 MRN: [id number]  DOB: June 15, 1966  Patient Care Team: Pcp, No as PCP - General [name], RN as Oncology Nurse Navigator    REASON FOR CONSULTATION: Barry [name]. is a 57 y.o. male with multiple medical problems including locally advanced stage IIIb rectal cancer status post neoadjuvant TNT, concurrent Xeloda and radiation, which was finished 08/27/2021.  This was followed by surgical resection/proctectomy with colostomy on 11/18/2021.  Patient has had multiple subsequent hospitalizations for intra-abdominal infection requiring multiple drains and extensive wound care/chronic antibiotics.  Patient was hospitalized at [facility] February 2024 with recurrent abscess.  There was concern for possible cancer recurrence.  Patient was referred back to medical oncology.  He was referred to palliative care to address goals and manage ongoing symptoms.  SOCIAL HISTORY:     reports that he quit smoking about 20 months ago. His smoking use included cigars and cigarettes. He has a 10.00 pack-year smoking history. He has never used smokeless tobacco. He reports that he does not currently use alcohol. He reports that he does not use drugs.  Patient is married and recently separated from his wife of 21 years.  He is currently living with his mother.  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  He has 7 children and 14 grandchildren.  ADVANCE DIRECTIVES:    CODE STATUS:   PAST MEDICAL HISTORY: Past Medical History:  Diagnosis Date   Headache    Left shoulder pain    Rectal cancer ([place])     PAST SURGICAL HISTORY:  Past Surgical History:  Procedure Laterality Date   COLON SURGERY     COLONOSCOPY WITH PROPOFOL N/A 02/06/2021   Procedure: COLONOSCOPY WITH PROPOFOL;  Surgeon: Lin Landsman, MD;  Location: [facility] ENDOSCOPY;   Service: Gastroenterology;  Laterality: N/A;   PORTA CATH INSERTION N/A 02/27/2021   Procedure: PORTA CATH INSERTION;  Surgeon: Algernon Huxley, MD;  Location: [place] [facility];  Service: Cardiovascular;  Laterality: N/A;   ROTATOR CUFF REPAIR Left     HEMATOLOGY/ONCOLOGY HISTORY:  Oncology History  Rectal cancer ([place])  02/08/2021 Initial Diagnosis   Rectal cancer   02/05/2021-02/06/2021 patient was hospitalized due to generalized weakness, intermittent lightheadedness, weight loss and worsening constipation.  02/05/2021 CT abdomen showed concerning of severe rectal wall thickening and right internal iliac lymph node concerning for metastatic disease.  Patient was seen by gastroenterology and had colonoscopy which showed a circumferential fungating mass in the rectum.  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   02/14/2021-02/16/2021 hospitalized due to rectal bleeding and pain.   02/14/2021 CT showed perirectal fluid collection/gas concerning for infection with significant leukocytosis, anemia with hemoglobin of 6.8.  Patient received PRBC transfusion, IV antibiotics.  He underwent an IR guided placement of JP drain into the rectal abscess.  Discharged home with oral Augmentin. 02/20/2021 presented to ER with new skin opening and draining from left gluteus.  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  JP drain has been removed.  Patient was continued on Augmentin.   02/13/2021, PET scan showed locally advanced rectal cancer with billowing of the mesorectum now with low attenuation material that was not present on previous examination with extensive stranding and inflammation.   Bulky RIGHT pelvic sidewall/hypogastric lymph node outside of the mesorectum with stippled calcification measuring 19 mm-no increased metabolic activity. Small LEFT  hypogastric lymph node just peripheral to the internal external bifurcation-SUV 3.2 High RIGHT internal just below or at the internal/common  iliac transition, lymph node -SUV 4.8 LEFT high hypogastric lymph node  8 mm-SUV 3. Scattered lymph nodes throughout the retroperitoneum with low FDG uptake Bilateral inguinal lymph nodes largest on the RIGHT (image 248/3) 11 mm with a maximum SUV of 2.8 spiculated nodule in the LEFT upper lobe- 9 x 8 mm       02/14/2021, CT abdomen pelvis without contrast Showed perforated rectal mass with contained perforation with fluid and gas extending above and below the pelvic floor,potentially involving the sphincter complex and extending into LEFT ischial rectal fossa   02/20/2021, CT pelvis with contrast showed Large perirectal/perianal abscess has markedly decreased in size since placement of the percutaneous drain. There are residual gas-filled collections in the soft tissues and suspect there is a fistula or sinus tract between the rectal mass and the subcutaneous tissues. Soft tissue gas along the medial left buttock and concern for a cutaneous ulceration in this area. Large rectal mass with evidence for a large necrotic right pelvic lymph node.   His case is complicated with a perirectal abscess. Prior to onset of perirectal abscess, on his 02/05/2021 scan, he was noted to have right internal iliac lymph node 3 x 1 x 2.3 cm which is concerning for nodal disease.On Subsequent images it was difficult to distinguish whether lymphadenopathy was due to nodal disease versus acute inflammation. 03/07/2021 MRI pelvis cT3 N2. Due to the possible contained perforation on previous CT, possible cT4 disease.    02/27/2021 medi port placed by Dr.Dew 03/13/2021 reports right butt cheek and also left perianal area fullness.he was seen by Dr.Pabon urgently  and had right buttock abscess drained. Left perianal fullness was felt to be due to cancer.    02/08/2021 Cancer Staging   Staging form: Colon and Rectum, AJCC 8th Edition - Clinical stage from 02/08/2021: Stage IIIB (cT4a, cN1, cM0) - Signed by Earlie Server, MD on  03/06/2021 Stage prefix: Initial diagnosis   02/27/2021 Procedure   medi port placed by Dr.Dew   03/18/2021 - 07/05/2021 Chemotherapy    FOLFOX q14d x 4 months      07/17/2021 - 08/27/2021 Chemotherapy   Xeloda concurrent with Radiation   11/18/2021 Surgery   patient is status post open APR with flap for rectal cancer. Pathology rectal adenomacarcinoma ypT3N0.  Positive margin: Radial (circumferential) or mesenteric: adjacent to perforation      02/25/2022 Imaging   CT chest angiogram with and without contrast, CT abdomen pelvis with and without contrast 1. Negative for acute pulmonary embolus.2. Emphysema. Further decrease in size of the previously noted irregular nodule in the left upper lobe which is now barely measurable today. No new suspicious lung nodules 3. Status post left lower quadrant colostomy. Further decrease in size since comparison exam from May of the rim enhancing gas and fluid collection at the pelvic surgical bed extending from the perineum superiorly into the pelvis 4. Slightly thickened appearance of terminal ileal small bowel loops in the pelvis with mild stranding suggesting small bowel inflammatory process. 5. Stable enlarged right pelvic sidewall lymph node   06/02/2022 Imaging   CT chest abdomen pelvis w contrast 1. Status post abdominal perineal resection with descending colostomy. 2. At the site of pelvic fluid and gas collection on 02/25/2022, there is residual, decreased presacral soft tissue fullness, without drainable collection. 3. Similar right obturator nodal metastasis.4.  No acute process or evidence  of metastatic disease in the chest. 5. Age advanced coronary artery atherosclerosis. Recommend assessment of coronary risk factors. 6. Aortic atherosclerosis and emphysema       ALLERGIES:  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  MEDICATIONS:  Current Outpatient Medications  Medication Sig Dispense Refill   amoxicillin-clavulanate (AUGMENTIN) 875-125  MG tablet Take by mouth.     feeding supplement (ENSURE ENLIVE / ENSURE PLUS) LIQD Take 237 mLs by mouth daily. (Patient not taking: Reported on 10/27/2022) 237 mL 12   lidocaine-prilocaine (EMLA) cream Apply 1 Application topically as needed. Apply to port and cover with saran wrap 1-2 hours prior to port access 30 g 2   nicotine (NICODERM CQ - DOSED IN MG/24 HOURS) 14 mg/24hr patch Place 1 patch (14 mg total) onto the skin daily. 28 patch 2   nicotine (NICODERM CQ - DOSED IN MG/24 HR) 7 mg/24hr patch Place 1 patch (7 mg total) onto the skin daily. BYRD fund to pay (Patient not taking: Reported on 09/22/2022) 28 patch 2   potassium chloride SA (KLOR-CON M) 20 MEQ tablet Take 1 tablet (20 mEq total) by mouth daily. BYRD fund to pay (Patient not taking: Reported on 09/22/2022) 3 tablet 0   No current facility-administered medications for this visit.    VITAL SIGNS: There were no vitals taken for this visit. There were no vitals filed for this visit.  Estimated body mass index is 23.9 kg/m as calculated from the following:   Height as of 10/13/22: 5' 8"$  (1.727 m).   Weight as of an earlier encounter on 10/27/22: 157 lb 3.2 oz (71.3 kg).  LABS: CBC:    Component Value Date/Time   WBC 5.1 10/27/2022 0840   HGB 13.1 10/27/2022 0840   HCT 39.3 10/27/2022 0840   PLT 361 10/27/2022 0840   MCV 86.0 10/27/2022 0840   NEUTROABS 2.4 10/27/2022 0840   LYMPHSABS 1.8 10/27/2022 0840   MONOABS 0.7 10/27/2022 0840   EOSABS 0.2 10/27/2022 0840   BASOSABS 0.0 10/27/2022 0840   Comprehensive Metabolic Panel:    Component Value Date/Time   NA 133 (L) 10/13/2022 1439   K 3.8 10/13/2022 1439   CL 101 10/13/2022 1439   CO2 24 10/13/2022 1439   BUN 10 10/13/2022 1439   CREATININE 0.82 10/13/2022 1439   GLUCOSE 94 10/13/2022 1439   CALCIUM 8.2 (L) 10/13/2022 1439   AST 20 10/13/2022 1439   ALT 14 10/13/2022 1439   ALKPHOS 87 10/13/2022 1439   BILITOT 0.6 10/13/2022 1439   PROT 8.0 10/13/2022 1439    ALBUMIN 3.6 10/13/2022 1439    RADIOGRAPHIC STUDIES: CT ABDOMEN PELVIS W CONTRAST  Result Date: 10/13/2022 CLINICAL DATA:  Abdominal pain, postop rectal pain. EXAM: CT ABDOMEN AND PELVIS WITH CONTRAST TECHNIQUE: Multidetector CT imaging of the abdomen and pelvis was performed using the standard protocol following bolus administration of intravenous contrast. RADIATION DOSE REDUCTION: This exam was performed according to the departmental dose-optimization program which includes automated exposure control, adjustment of the mA and/or kV according to patient size and/or use of iterative reconstruction technique. CONTRAST:  134m OMNIPAQUE IOHEXOL 300 MG/ML  SOLN COMPARISON:  CT abdomen dated 06/26/2022. FINDINGS: Lower chest: No acute abnormality. Hepatobiliary: No acute or suspicious findings within the liver. Gallbladder is unremarkable. No bile duct dilatation is seen. Pancreas: Unremarkable. No pancreatic ductal dilatation or surrounding inflammatory changes. Spleen: Normal in size without focal abnormality. Adrenals/Urinary Tract: Adrenal glands are unremarkable. Kidneys are unremarkable without suspicious mass, stone or hydronephrosis. Bladder walls  are thick walled/edematous. Stomach/Bowel: No dilated large or small bowel loops. LEFT lower abdominal wall colostomy, without obstruction or inflammatory change. Vascular/Lymphatic: Aortic atherosclerosis. No acute-appearing vascular abnormality. Reproductive: History of prostatectomy. Other: Complex hypodense collection extending from the anal verge inferiorly into the presacral space and anteriorly from the presacral space to the bladder dome, not significantly changed in size or extent compared to the earlier CT of 06/26/2022. Stable rounded fluid collection along the posterior portion of the RIGHT pelvic sidewall, not contiguous with the aforementioned fluid collection in the presacral space and therefore most likely an unrelated postsurgical seroma.  Musculoskeletal: No acute-appearing osseous abnormality. No evidence of osteomyelitis. IMPRESSION: 1. Complex collection persists in the lower pelvis, extending from the anal verge upwards into the presacral space and anteriorly from the presacral space to the bladder dome, not significantly changed in size or extent compared to the earlier CT of 06/26/2022. This is most likely a combination of a postoperative seroma and/or chronic phlegmon/abscess versus recurrent abscess. 2. Bladder walls are thick walled/edematous. Given the contiguity of the presacral collection and the bladder dome, this is highly suspicious for a related bladder wall infection and possibly secondary to colovesical fistula. Recommend correlation with urinalysis. 3. No evidence of bowel obstruction. LEFT lower abdominal wall colostomy, without obstruction or inflammatory change. 4. No free intraperitoneal air. Aortic Atherosclerosis (ICD10-I70.0). Electronically Signed   By: Franki Cabot M.D.   On: 10/13/2022 17:53   DG Chest Port 1 View  Result Date: 10/13/2022 CLINICAL DATA:  Sepsis EXAM: PORTABLE CHEST 1 VIEW COMPARISON:  06/26/2022, 10/09/2022 FINDINGS: Right IJ approach chest port remains in place. The heart size and mediastinal contours are within normal limits. Both lungs are clear. The visualized skeletal structures are unremarkable. IMPRESSION: No active disease. Electronically Signed   By: Davina Poke D.O.   On: 10/13/2022 16:47   CT Chest W Contrast  Result Date: 10/09/2022 CLINICAL DATA:  Rectal cancer, chest staging follow-up lung nodule * Tracking Code: BO * EXAM: CT CHEST WITH CONTRAST TECHNIQUE: Multidetector CT imaging of the chest was performed during intravenous contrast administration. RADIATION DOSE REDUCTION: This exam was performed according to the departmental dose-optimization program which includes automated exposure control, adjustment of the mA and/or kV according to patient size and/or use of iterative  reconstruction technique. CONTRAST:  66m OMNIPAQUE IOHEXOL 300 MG/ML  SOLN COMPARISON:  CT chest abdomen pelvis, 06/02/2022 FINDINGS: Cardiovascular: Right chest port catheter. Scattered aortic atherosclerosis. Normal heart size. Left coronary artery calcifications. No pericardial effusion. Mediastinum/Nodes: No enlarged mediastinal, hilar, or axillary lymph nodes. Thymic remnant in the anterior mediastinum. Thyroid gland, trachea, and esophagus demonstrate no significant findings. Lungs/Pleura: Moderate centrilobular emphysema. Diffuse bilateral bronchial wall thickening. Unchanged 0.4 cm fissural nodule of the anterior left lower lobe (series 3, image 83). No pleural effusion or pneumothorax. Upper Abdomen: No acute abnormality. Musculoskeletal: No chest wall abnormality. No acute osseous findings. IMPRESSION: 1. Unchanged 0.4 cm fissural nodule of the anterior left lower lobe. This is almost certainly a benign fissural lymph node. No new or suspicious pulmonary nodules. 2. Moderate emphysema and diffuse bilateral bronchial wall thickening. 3. Coronary artery disease. Aortic Atherosclerosis (ICD10-I70.0) and Emphysema (ICD10-J43.9). Electronically Signed   By: ADelanna AhmadiM.D.   On: 10/09/2022 14:21   MR Pelvis Wo Contrast  Result Date: 10/01/2022 CLINICAL DATA:  Follow-up locally advanced rectal carcinoma. Status post abdominoperitoneal resection, chemotherapy, and radiation therapy. EXAM: MRI PELVIS WITHOUT CONTRAST TECHNIQUE: Multiplanar multisequence MR imaging of the pelvis was performed. No  intravenous contrast was administered. Ultrasound gel was administered per rectum to optimize tumor evaluation. COMPARISON:  MRI on 03/07/2021, and CT on 06/26/22 FINDINGS: Lower Urinary Tract: Unremarkable urinary bladder. Bowel: Patient has undergone abdominoperitoneal resection since prior MRI. A small fluid collection is seen in the surgical bed measuring 1.7 x 1.2 cm, significantly decreased from 7.3 x 4.0 cm  on most recent CT. This is consistent with resolving postoperative fluid collection or abscess. A heterogeneous T2 hypointense presacral mass is seen. Comparison with prior preop MRI is difficult, but this does show considerable decrease in bulkiness since the original preop MRI. This currently measures 4.3 by 3.0 x 3.4 cm on images 14/8 and 30/9, and shows no significant change in size compared to recent CT on 06/26/2022. This mass does show diffuse restricted diffusion, and is relatively bulky in appearance. These features favor recurrent carcinoma over posttreatment changes J Vascular/Lymphatic: Enlarged pelvic sidewall lymph node measures 3.2 x 2.5 cm on image 9/8, without significant change since previous study. No new or increased lymphadenopathy identified. Reproductive:  Unremarkable prostate. Other: None. Musculoskeletal: No suspicious bone lesions identified. IMPRESSION: Interval abdominoperitoneal resection since prior MRI. Decreased small fluid collection in the surgical bed compared with more recent CT, consistent with resolving postoperative fluid collection or abscess. Bulky rounded presacral mass, which shows diffuse restricted diffusion, without significant change in size since most recent CT of 06/26/2022. This raises suspicion for recurrent carcinoma over post treatment changes. Recommend correlation with CEA level, and consider tissue sampling or PET-CT. Stable enlarged right pelvic sidewall lymph node. No new or increased adenopathy identified. Electronically Signed   By: Marlaine Hind M.D.   On: 10/01/2022 10:48    PERFORMANCE STATUS (ECOG) : 1 - Symptomatic but completely ambulatory  Review of Systems Unless otherwise noted, a complete review of systems is negative.  Physical Exam General: NAD Pulmonary: Unlabored Extremities: no edema, no joint deformities Skin: no rashes Neurological: Weakness but otherwise nonfocal  IMPRESSION: Patient seen following his visit with Dr. Tasia Catchings.   Unfortunately, [name] was concerned that recurrent abscess could reflect recurrence of his cancer.  Plan is to repeat biopsy with consideration of possible future chemotherapy if needed.  It does not sound like patient's disease is amenable to further surgical intervention.  Patient says that his symptoms are fairly well-controlled.  He has occasional pain and takes Tylenol for that with good relief.  Unfortunately, he says he has had significant amount of depression and anxiety and insomnia recently.  This is in part due to news of possible recurrence of his cancer but has been exacerbated by the recent separation of his wife of 21 years.  He says that they drifted apart and she recently told him that she was leaving.  Patient has moved into his mother's house.  He describes a strong faith in God and says that he has been praying.   Patient would like medication to help with depression, anxiety, and sleep.  Will start him on citalopram and trazodone.  Patient denies SI.  Patient describes having financial difficulty paying for glasses and dental work.  He is disabled but has been unsuccessful with Medicaid.  Will refer to social work for resources and counseling.  PLAN: -Continue current scope of treatment -Start citalopram daily and trazodone as needed -Referral to social work -Follow up telephone visit 1 month  Case and plan discussed with Dr. Tasia Catchings   Patient expressed understanding and was in agreement with this plan. He also understands that He can  call the clinic at any time with any questions, concerns, or complaints.     Time Total: 25 minutes  Visit consisted of counseling and education dealing with the complex and emotionally intense issues of symptom management and palliative care in the setting of serious and potentially life-threatening illness.Greater than 50%  of this time was spent counseling and coordinating care related to the above assessment and plan.  Signed by: Altha Harm, PhD, NP-C

## 2022-10-27 NOTE — Assessment & Plan Note (Signed)
Recurrent abscess.  Concerning of local recurrence.  See above plan.

## 2022-10-27 NOTE — Assessment & Plan Note (Signed)
Continue port flush every 8 weeks. 

## 2022-10-28 ENCOUNTER — Inpatient Hospital Stay: Payer: 59 | Admitting: Oncology

## 2022-10-28 ENCOUNTER — Inpatient Hospital Stay: Payer: 59

## 2022-10-28 ENCOUNTER — Encounter: Payer: Self-pay | Admitting: Licensed Clinical Social Worker

## 2022-10-28 ENCOUNTER — Inpatient Hospital Stay: Payer: 59 | Admitting: Hospice and Palliative Medicine

## 2022-10-28 LAB — CEA: CEA: 2.1 ng/mL (ref 0.0–4.7)

## 2022-10-28 NOTE — Progress Notes (Signed)
[place] Work  Clinical Social Work was referred by medical provider for assessment of psychosocial needs.  Clinical Social Worker attempted to contact patient by phone  to offer support and assess for needs.  CSW left voicemail with contact information with request for a return call.    FA   Adelene Amas, LCSW  Clinical Social Worker [facility]

## 2022-10-29 ENCOUNTER — Encounter: Payer: Self-pay | Admitting: Oncology

## 2022-10-30 ENCOUNTER — Telehealth: Payer: Self-pay

## 2022-10-30 ENCOUNTER — Other Ambulatory Visit: Payer: 59

## 2022-10-30 ENCOUNTER — Other Ambulatory Visit: Payer: Self-pay | Admitting: Oncology

## 2022-10-30 DIAGNOSIS — C20 Malignant neoplasm of rectum: Secondary | ICD-10-CM

## 2022-10-30 DIAGNOSIS — R1909 Other intra-abdominal and pelvic swelling, mass and lump: Secondary | ICD-10-CM

## 2022-10-30 MED ORDER — AMOXICILLIN-POT CLAVULANATE 875-125 MG PO TABS: 1.0000 | ORAL_TABLET | Freq: Two times a day (BID) | ORAL | 0 refills | Status: DC

## 2022-10-30 NOTE — Telephone Encounter (Signed)
Dr. Tasia Catchings has contacted pt and discussed TB recommendations.   Please schedule pelvic MRI w contrast next week and inform pt of appt.  I will contact IR to cancel CT biopsy. Dr. Tasia Catchings has made Dr. Denna Haggard about plan.

## 2022-10-30 NOTE — Telephone Encounter (Signed)
Biopsy has been cancelled.

## 2022-11-03 ENCOUNTER — Ambulatory Visit: Admission: RE | Admit: 2022-11-03 | Payer: 59 | Source: Ambulatory Visit

## 2022-11-03 ENCOUNTER — Encounter: Payer: Self-pay | Admitting: Licensed Clinical Social Worker

## 2022-11-03 ENCOUNTER — Inpatient Hospital Stay: Payer: 59 | Admitting: Licensed Clinical Social Worker

## 2022-11-03 DIAGNOSIS — C20 Malignant neoplasm of rectum: Secondary | ICD-10-CM

## 2022-11-03 NOTE — Progress Notes (Signed)
[place] CSW Progress Note  Clinical Social Worker contacted patient by phone to inquire if his health insurance covers yearly eye exam.  Left voicemail with contact information and request to for return call.    Adelene Amas, LCSW

## 2022-11-03 NOTE — Progress Notes (Signed)
[place] Work  Initial Assessment   Barry [name]. is a 57 y.o. year old male contacted by phone. Clinical Social Work was referred by medical provider for assessment of psychosocial needs.   SDOH (Social Determinants of Health) assessments performed: Yes SDOH Interventions    Flowsheet Row Clinical Support from 11/03/2022 in [place] at [place] Interventions Intervention Not Indicated, Other (Comment)  [Food pantry [place] Interventions Other (Comment), Intervention Not Indicated  [Patiet currently living with his mother, will apply for [place]  Transportation Interventions Intervention Not Indicated, Patient Resources (Friends/Family)  Utilities Interventions Intervention Not Indicated  Alcohol Usage Interventions Intervention Not Indicated (Score <7)  Depression Interventions/Treatment  Counseling  Financial Strain Interventions Other (Comment)  [Patient needs dnetal and vision assistance, has medical insurance and receives disability]  Physical Activity Interventions Intervention Not Indicated  Stress Interventions Provide Counseling  Social Connections Interventions Intervention Not Indicated       SDOH Screenings   Food Insecurity: Food Insecurity Present (11/03/2022)  Housing: Low Risk  (11/03/2022)  Transportation Needs: No Transportation Needs (11/03/2022)  Utilities: Not At Risk (11/03/2022)  Alcohol Screen: Low Risk  (11/03/2022)  Depression (PHQ2-9): Medium Risk (11/03/2022)  Financial Resource Strain: Medium Risk (11/03/2022)  Physical Activity: Inactive (11/03/2022)  Social Connections: Moderately Isolated (11/03/2022)  Stress: Stress Concern Present (11/03/2022)  Tobacco Use: Medium Risk (10/27/2022)     Distress Screen completed: No    02/08/2021    9:14 AM  ONCBCN DISTRESS SCREENING  Screening Type Initial Screening  Distress experienced in past week (1-10) 0       Family/Social Information:  Housing Arrangement: patient lives with mother,  main contact spouse Izora Ribas [phone].  Patient is currently separated. Family members/support persons in your life? Family and Medical Staff Transportation concerns: no  Employment: Disabled  .  Income source: Banker concerns:  patient is currently receiving disability  Type of concern: Food and dental and vision insurance Food access concerns: yes, patient is currently concerned about affording food, specifically; in the long term Religious or spiritual practice: Not known Services Currently in place:  [employer] [facility], [place] Family [employer], patient does not meet criteria for traditional Medicaid due to income.  Coping/ Adjustment to diagnosis: Patient understands treatment plan and what happens next? yes, patient pending MRI results Concerns about diagnosis and/or treatment: How I will pay for the services I need and How will I care for myself Patient reported stressors: Housing, Publishing rights manager, Haematologist, Production manager, Anxiety/ nervousness, Isolation/ feeling alone, and need for vision and dental care Hopes and/or priorities: to receive assistance for dental and vision care Patient enjoys  N/A Current coping skills/ strengths: Average or above average intelligence , Capable of independent living , [employer] of knowledge , Motivation for treatment/growth , Supportive family/friends , and Work skills     SUMMARY: Current SDOH Barriers:  Financial constraints related to fixed income, Limited social support, Housing barriers, Mental Health Concerns , and Family and relationship dysfunction  Clinical Social Work Clinical Goal(s):  No clinical social work goals at this time  Interventions: Discussed common feeling and emotions when being diagnosed with cancer, and the importance of support during treatment Informed patient of the support team roles and support services at  [facility] Provided CSW contact information and encouraged patient to call with any questions or concerns Provided patient with information about CSW role in patient care,  and  other available resources. Discussed different options to find dental and vision car assistance, patient is currently living in [place].   Follow Up Plan: CSW will follow-up with patient by phone  Patient verbalizes understanding of plan: Yes    Adelene Amas, LCSW

## 2022-11-03 NOTE — Progress Notes (Signed)
[place] CSW Progress Note  Clinical Scientist, physiological for [employer] and [employer] to [email or web address].    Adelene Amas, LCSW

## 2022-11-05 ENCOUNTER — Ambulatory Visit: Admission: RE | Admit: 2022-11-05 | Payer: 59 | Source: Ambulatory Visit

## 2022-11-11 ENCOUNTER — Encounter: Payer: Self-pay | Admitting: Oncology

## 2022-11-14 ENCOUNTER — Ambulatory Visit
Admission: RE | Admit: 2022-11-14 | Discharge: 2022-11-14 | Disposition: A | Discharge status: Home / Self Care | Payer: 59 | Source: Ambulatory Visit | Attending: Oncology | Admitting: Oncology

## 2022-11-14 DIAGNOSIS — R1909 Other intra-abdominal and pelvic swelling, mass and lump: Secondary | ICD-10-CM | POA: Insufficient documentation

## 2022-11-14 DIAGNOSIS — D168 Benign neoplasm of pelvic bones, sacrum and coccyx: Secondary | ICD-10-CM | POA: Diagnosis not present

## 2022-11-14 DIAGNOSIS — C20 Malignant neoplasm of rectum: Secondary | ICD-10-CM | POA: Diagnosis not present

## 2022-11-14 MED ORDER — GADOBUTROL 1 MMOL/ML IV SOLN
7.0000 mL | Freq: Once | INTRAVENOUS | Status: AC | PRN
  Administered 2022-11-14: 7 mL via INTRAVENOUS

## 2022-11-17 ENCOUNTER — Inpatient Hospital Stay: Payer: 59 | Attending: Oncology

## 2022-11-17 DIAGNOSIS — Z452 Encounter for adjustment and management of vascular access device: Secondary | ICD-10-CM | POA: Insufficient documentation

## 2022-11-17 DIAGNOSIS — Z85048 Personal history of other malignant neoplasm of rectum, rectosigmoid junction, and anus: Secondary | ICD-10-CM | POA: Insufficient documentation

## 2022-11-17 DIAGNOSIS — Z95828 Presence of other vascular implants and grafts: Secondary | ICD-10-CM

## 2022-11-17 MED ORDER — HEPARIN SOD (PORK) LOCK FLUSH 100 UNIT/ML IV SOLN
500.0000 [IU] | Freq: Once | INTRAVENOUS | Status: AC
  Administered 2022-11-17: 500 [IU] via INTRAVENOUS
  Filled 2022-11-17: qty 5

## 2022-11-17 MED ORDER — SODIUM CHLORIDE 0.9% FLUSH
10.0000 mL | INTRAVENOUS | Status: DC | PRN
  Administered 2022-11-17: 10 mL via INTRAVENOUS
  Filled 2022-11-17: qty 10

## 2022-11-20 ENCOUNTER — Telehealth: Payer: Self-pay

## 2022-11-20 DIAGNOSIS — R1900 Intra-abdominal and pelvic swelling, mass and lump, unspecified site: Secondary | ICD-10-CM

## 2022-11-20 DIAGNOSIS — R1909 Other intra-abdominal and pelvic swelling, mass and lump: Secondary | ICD-10-CM

## 2022-11-20 NOTE — Telephone Encounter (Signed)
Per Dr. Tasia Catchings, she spoke with IR Dr. Damita Dunnings and CT guided biopsy of pelvic mass was recommended to see if cancer has recurred or not. Called pt to inform him of recommendation and no answer. Detailed VM left on ph and MyChart message sent as well.   Waiting on pt response to send biopsy scheduling request.

## 2022-11-21 NOTE — Telephone Encounter (Signed)
Follow up call made to pt. No answer, detailed mssg left to call back.

## 2022-11-24 NOTE — Progress Notes (Signed)
Corrie Mckusick, DO sent to Barry [name] PROCEDURE / BIOPSY REVIEW Date: 11/24/22  OK for CT guided biopsy of presacral mass.  For Barry [name] day.  Discussed with Dr. 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, with recurrent infection Imaging review: Best seen on CT 10/13/2022, potential transgluteal approach 77/97, series 2  Decision: Approved Imaging modality to perform:  CT Schedule with: Moderate Sedation Schedule for: Any VIR or Dr Earleen Newport       Previous Messages    ----- Message ----- From: Norton Blizzard Sent: 11/24/2022   2:46 PM EDT To: Corrie Mckusick, DO Subject: ATTN: Dr Rolla Plate    CT Biopsy                IR Approval Request:   Procedure:   CT guided Pelvic Mass Biopsy  Reason:       Pelvic mass  History:        CT Abd/pelvis with contrast 10/13/2022  &  MR Pelvis w/wo contrast  11/15/2022    Provider:      Dr Earlie Server, MD  Provider Contact #   [place]   [phone]

## 2022-11-24 NOTE — Telephone Encounter (Signed)
Spoke to pt and he agrees to proceed with biopsy. Request faxed to IR

## 2022-11-25 ENCOUNTER — Encounter: Payer: Self-pay | Admitting: Oncology

## 2022-11-25 ENCOUNTER — Inpatient Hospital Stay (HOSPITAL_BASED_OUTPATIENT_CLINIC_OR_DEPARTMENT_OTHER): Payer: 59 | Admitting: Hospice and Palliative Medicine

## 2022-11-25 DIAGNOSIS — G4700 Insomnia, unspecified: Secondary | ICD-10-CM | POA: Diagnosis not present

## 2022-11-25 DIAGNOSIS — C20 Malignant neoplasm of rectum: Secondary | ICD-10-CM

## 2022-11-25 DIAGNOSIS — F419 Anxiety disorder, unspecified: Secondary | ICD-10-CM | POA: Diagnosis not present

## 2022-11-25 DIAGNOSIS — Z515 Encounter for palliative care: Secondary | ICD-10-CM

## 2022-11-25 NOTE — Progress Notes (Signed)
Virtual Visit via Telephone Note  I connected with Barry [name]. on 11/25/22 at  1:20 PM EDT by telephone and verified that I am speaking with the correct person using two identifiers.  Location: Patient: Home Provider: Clinic   I discussed the limitations, risks, security and privacy concerns of performing an evaluation and management service by telephone and the availability of in person appointments. I also discussed with the patient that there may be a patient responsible charge related to this service. The patient expressed understanding and agreed to proceed.   History of Present Illness: Barry [name]. is a 57 y.o. male with multiple medical problems including locally advanced stage IIIb rectal cancer status post neoadjuvant TNT, concurrent Xeloda and radiation, which was finished 08/27/2021.  This was followed by surgical resection/proctectomy with colostomy on 11/18/2021.  Patient has had multiple subsequent hospitalizations for intra-abdominal infection requiring multiple drains and extensive wound care/chronic antibiotics.  Patient was hospitalized at [facility] 2024 with recurrent abscess.  There was concern for possible cancer recurrence.  Patient was referred back to medical oncology.  He was referred to palliative care to address goals and manage ongoing symptoms.     Observations/Objective: I called and spoke with patient by phone.  He reports that he is doing better.  He denies any significant changes or concerns.  No symptomatic complaints at present.  He reports that he has tolerated the citalopram and trazodone well.  He is sleeping better and has had overall improvement with his moods, depression, and anxiety.  He would like to continue current regimen.  Assessment and Plan: Stage IIIb rectal cancer -patient is pending repeat biopsy will follow-up with Dr. Tasia Catchings  Depression/anxiety/insomnia -continue citalopram and trazodone.  Patient has refills for  both.  Follow Up Instructions: Follow-up telephone visit 1 to 2 months   I discussed the assessment and treatment plan with the patient. The patient was provided an opportunity to ask questions and all were answered. The patient agreed with the plan and demonstrated an understanding of the instructions.   The patient was advised to call back or seek an in-person evaluation if the symptoms worsen or if the condition fails to improve as anticipated.  I provided 10 minutes of non-face-to-face time during this encounter.   Irean Hong, NP

## 2022-11-25 NOTE — Telephone Encounter (Addendum)
Dr. Earleen Newport will perform biopsy on 4/2 @8 :30a, arrive 7:30a. Pt is aware and will also see appt on Mychart.

## 2022-12-01 ENCOUNTER — Ambulatory Visit: Payer: 59

## 2022-12-08 ENCOUNTER — Other Ambulatory Visit: Payer: Self-pay | Admitting: Student

## 2022-12-08 DIAGNOSIS — R1900 Intra-abdominal and pelvic swelling, mass and lump, unspecified site: Secondary | ICD-10-CM

## 2022-12-08 NOTE — Progress Notes (Signed)
Patient for CT Abd Mass Biopsy on Tues 12/09/2022, I called and spoke with the patient on the phone and gave pre-procedure instructions. Pt was made aware to be here at 7:30a, NPO after MN prior to procedure as well as driver post procedure/recovery/discharge. Pt stated understanding.  Called 12/08/2022

## 2022-12-09 ENCOUNTER — Ambulatory Visit
Admission: RE | Admit: 2022-12-09 | Discharge: 2022-12-09 | Disposition: A | Discharge status: Home / Self Care | Payer: 59 | Source: Ambulatory Visit | Attending: Oncology | Admitting: Oncology

## 2022-12-09 DIAGNOSIS — Z85048 Personal history of other malignant neoplasm of rectum, rectosigmoid junction, and anus: Secondary | ICD-10-CM | POA: Insufficient documentation

## 2022-12-09 DIAGNOSIS — C20 Malignant neoplasm of rectum: Secondary | ICD-10-CM | POA: Diagnosis not present

## 2022-12-09 DIAGNOSIS — R1900 Intra-abdominal and pelvic swelling, mass and lump, unspecified site: Secondary | ICD-10-CM | POA: Diagnosis not present

## 2022-12-09 LAB — PROTIME-INR
INR: 1.1 (ref 0.8–1.2)
Prothrombin Time: 13.7 seconds (ref 11.4–15.2)

## 2022-12-09 LAB — CBC
HCT: 43.8 % (ref 39.0–52.0)
Hemoglobin: 14.9 g/dL (ref 13.0–17.0)
MCH: 29.4 pg (ref 26.0–34.0)
MCHC: 34 g/dL (ref 30.0–36.0)
MCV: 86.4 fL (ref 80.0–100.0)
Platelets: 206 10*3/uL (ref 150–400)
RBC: 5.07 MIL/uL (ref 4.22–5.81)
RDW: 16.5 % — ABNORMAL HIGH (ref 11.5–15.5)
WBC: 4.9 10*3/uL (ref 4.0–10.5)
nRBC: 0 % (ref 0.0–0.2)

## 2022-12-09 MED ORDER — SODIUM CHLORIDE 0.9 % IV SOLN: INTRAVENOUS | Status: DC

## 2022-12-09 MED ORDER — MIDAZOLAM HCL 2 MG/2ML IJ SOLN
INTRAMUSCULAR | Status: AC
  Filled 2022-12-09: qty 2

## 2022-12-09 MED ORDER — LIDOCAINE HCL (PF) 1 % IJ SOLN
10.0000 mL | Freq: Once | INTRAMUSCULAR | Status: AC
  Administered 2022-12-09: 10 mL
  Filled 2022-12-09: qty 10

## 2022-12-09 MED ORDER — FENTANYL CITRATE (PF) 100 MCG/2ML IJ SOLN
INTRAMUSCULAR | Status: AC | PRN
  Administered 2022-12-09: 25 ug via INTRAVENOUS
  Administered 2022-12-09: 50 ug via INTRAVENOUS
  Administered 2022-12-09: 25 ug via INTRAVENOUS

## 2022-12-09 MED ORDER — FENTANYL CITRATE (PF) 100 MCG/2ML IJ SOLN
INTRAMUSCULAR | Status: AC
  Filled 2022-12-09: qty 2

## 2022-12-09 MED ORDER — MIDAZOLAM HCL 2 MG/2ML IJ SOLN
INTRAMUSCULAR | Status: AC | PRN
  Administered 2022-12-09: 1 mg via INTRAVENOUS
  Administered 2022-12-09: .5 mg via INTRAVENOUS

## 2022-12-09 MED ORDER — MIDAZOLAM HCL 5 MG/5ML IJ SOLN
INTRAMUSCULAR | Status: AC | PRN
  Administered 2022-12-09: .5 mg via INTRAVENOUS

## 2022-12-09 NOTE — Procedures (Signed)
Interventional Radiology Procedure Note  Procedure:  CT guided bx of pelvic mass.  Suspected recurrence of colorectal CA.   Complications: None  Recommendations:  - Ok to shower tomorrow - Do not submerge for 7 days - Routine line care  - 1 hr dc home - restart home meds - follow up path  Signed,  Dulcy Fanny. Earleen Newport, DO

## 2022-12-09 NOTE — Progress Notes (Signed)
Patient clinically stable post CT Abdominal mass biopsy per Dr Earleen Newport, tolerated well. Vitals stable pre and post procedure. Received Versed 2 mg along with Fentanyl 100 mcg IV for procedure. Denies complaints post procedure. Report given to [place] post procedure/331

## 2022-12-09 NOTE — H&P (Signed)
Chief Complaint: Patient was seen in consultation today for pelvic mass  Referring Physician(s): Yu,Zhou  Supervising Physician: Corrie Mckusick  Patient Status: [facility] - Out-pt  History of Present Illness: Barry [name]. is a 57 y.o. male with PMH significant for stage IIIb rectal cancer being seen today in relation to a pelvic mass. The patient has been under the care of Dr Tasia Catchings from Oncology for his rectal cancer. The patient has had multiple intra-abdominal abscesses following proctectomy with colostomy on 11/18/21. Recent imaging suggests a possible recurrence of his cancer, and patient was subsequently referred to IR for further evaluation.  Past Medical History:  Diagnosis Date   Headache    Left shoulder pain    Rectal cancer [facility])     Past Surgical History:  Procedure Laterality Date   COLON SURGERY     COLONOSCOPY WITH PROPOFOL N/A 02/06/2021   Procedure: COLONOSCOPY WITH PROPOFOL;  Surgeon: Lin Landsman, MD;  [facility]: [facility] ENDOSCOPY;  Service: Gastroenterology;  Laterality: N/A;   PORTA CATH INSERTION N/A 02/27/2021   Procedure: PORTA CATH INSERTION;  Surgeon: Algernon Huxley, MD;  Location: [place] CV LAB;  Service: Cardiovascular;  Laterality: N/A;   ROTATOR CUFF REPAIR Left     Allergies: Shellfish allergy  Medications: Prior to Admission medications   Medication Sig Start Date End Date Taking? Authorizing Provider  amoxicillin-clavulanate (AUGMENTIN) 875-125 MG tablet Take 1 tablet by mouth 2 (two) times daily. 10/30/22  Yes Earlie Server, MD  citalopram (CELEXA) 10 MG tablet Take 1 tablet (10 mg total) by mouth daily. 10/27/22  Yes Borders, Kirt Boys, NP  lidocaine-prilocaine (EMLA) cream Apply 1 Application topically as needed. Apply to port and cover with saran wrap 1-2 hours prior to port access 08/13/22  Yes Earlie Server, MD  nicotine (NICODERM CQ - DOSED IN MG/24 HOURS) 14 mg/24hr patch Place 1 patch (14 mg total) onto the skin daily. 03/05/22  Yes Earlie Server, MD  traZODone (DESYREL) 50 MG tablet Take 1 tablet (50 mg total) by mouth at bedtime as needed for sleep. 10/27/22  Yes Borders, Kirt Boys, NP  feeding supplement (ENSURE ENLIVE / ENSURE PLUS) LIQD Take 237 mLs by mouth daily. Patient not taking: Reported on 10/27/2022 02/07/21   Lorella Nimrod, MD  nicotine (NICODERM CQ - DOSED IN MG/24 HR) 7 mg/24hr patch Place 1 patch (7 mg total) onto the skin daily. BYRD fund to pay Patient not taking: Reported on 09/22/2022 06/09/22   Earlie Server, MD  potassium chloride SA (KLOR-CON M) 20 MEQ tablet Take 1 tablet (20 mEq total) by mouth daily. BYRD fund to pay Patient not taking: Reported on 09/22/2022 06/09/22   Earlie Server, MD  prochlorperazine (COMPAZINE) 10 MG tablet Take 1 tablet (10 mg total) by mouth every 6 (six) hours as needed (Nausea or vomiting). Patient not taking: Reported on 10/07/2021 07/08/21 02/22/22  Earlie Server, MD     Family History  Problem Relation Age of Onset   Cancer Sister    Diabetes Mother    Cancer Maternal Grandmother    Cancer Paternal Grandmother     Social History   Socioeconomic History   Marital status: Married    Spouse name: Not on file   Number of children: Not on file   Years of education: Not on file   Highest education level: Not on file  Occupational History   Not on file  Tobacco Use   Smoking status: Former    Packs/day: 1.00  Years: 10.00    Additional pack years: 0.00    Total pack years: 10.00    Types: Cigars, Cigarettes    Quit date: 02/04/2021    Years since quitting: 1.8   Smokeless tobacco: Never  Substance and Sexual Activity   Alcohol use: Not Currently   Drug use: No   Sexual activity: Not on file  Other Topics Concern   Not on file  Social History Narrative   Not on file   Social Determinants of Health   Financial Resource Strain: Medium Risk (11/03/2022)   Overall Financial Resource Strain (CARDIA)    Difficulty of Paying Living Expenses: Somewhat hard  Food Insecurity: Food  Insecurity Present (11/03/2022)   Hunger Vital Sign    Worried About Running Out of Food in the Last Year: Sometimes true    Ran Out of Food in the Last Year: Sometimes true  Transportation Needs: No Transportation Needs (11/03/2022)   PRAPARE - Hydrologist (Medical): No    Lack of Transportation (Non-Medical): No  Physical Activity: Inactive (11/03/2022)   Exercise Vital Sign    Days of Exercise per Week: 0 days    Minutes of Exercise per Session: 0 min  Stress: Stress Concern Present (11/03/2022)   [place]    Feeling of Stress : Rather much  Social Connections: Moderately Isolated (11/03/2022)   Social Connection and Isolation Panel [NHANES]    Frequency of Communication with Friends and Family: Twice a week    Frequency of Social Gatherings with Friends and Family: Three times a week    Attends Religious Services: 1 to 4 times per year    Active Member of Clubs or Organizations: No    Attends Archivist Meetings: Never    Marital Status: Separated    Code Status: Full code  Review of Systems: A 12 point ROS discussed and pertinent positives are indicated in the HPI above.  All other systems are negative.  Review of Systems  Constitutional:  Negative for chills and fever.  Respiratory:  Negative for chest tightness and shortness of breath.   Cardiovascular:  Negative for chest pain and leg swelling.  Gastrointestinal:  Negative for abdominal pain, diarrhea, nausea and vomiting.  Neurological:  Negative for dizziness and headaches.  Psychiatric/Behavioral:  Negative for confusion.     Vital Signs: BP (!) 149/105   Pulse 84   Temp 98 F (36.7 C)   Resp 14   Ht 5\' 8"  (1.727 m)   Wt 172 lb (78 kg)   SpO2 99%   BMI 26.15 kg/m     Physical Exam Vitals reviewed.  Constitutional:      General: He is not in acute distress.    Appearance: He is not ill-appearing.   Cardiovascular:     Rate and Rhythm: Normal rate and regular rhythm.     Pulses: Normal pulses.     Heart sounds: Normal heart sounds.  Pulmonary:     Effort: Pulmonary effort is normal.     Breath sounds: Normal breath sounds.  Abdominal:     Palpations: Abdomen is soft.     Tenderness: There is no abdominal tenderness.  Musculoskeletal:     Right lower leg: No edema.     Left lower leg: No edema.  Skin:    General: Skin is warm and dry.  Neurological:     Mental Status: He is alert and oriented to person,  place, and time.  Psychiatric:        Mood and Affect: Mood normal.        Behavior: Behavior normal.     Imaging: MR PELVIS W WO CONTRAST  Result Date: 11/15/2022 CLINICAL DATA:  Presacral mass, history of rectal cancer status post resection with colostomy EXAM: MRI PELVIS WITHOUT AND WITH CONTRAST TECHNIQUE: Multiplanar multisequence MR imaging of the pelvis was performed both before and after administration of intravenous contrast. CONTRAST:  88mL GADAVIST GADOBUTROL 1 MMOL/ML IV SOLN COMPARISON:  CT abdomen pelvis, 10/13/2022, MR pelvis, 09/29/2018 FINDINGS: Urinary Tract: Unchanged thickening of the urinary bladder wall (series 8, image 22). Bowel: Status post abdominoperineal resection with left lower quadrant end colostomy. Compared to prior CT and MR, no significant change in appearance of heterogeneous, rim enhancing presacral and low pelvic soft tissue and fluid. Largest heterogeneously enhancing conglomerate of fluid and soft tissue appears to closely involve the anteriorly abutting the seminal vesicles and measures 3.0 x 2.9 cm in largest axial dimension (series 11, image 22). This extends to superiorly contact the posterior bladder dome (series 11, image 15, series 5, image 17) and inferiorly towards the gluteal cleft. There is a fluid component inferiorly measuring 2.4 x 1.3 cm (series 11, image 29). Additional intrinsically T1 hyperintense, rim enhancing hemorrhagic or  proteinaceous fluid collection in the right hemipelvis measuring 3.2 x 2.6 cm (series 11, image 19). Vascular/Lymphatic: No pathologically enlarged lymph nodes. No significant vascular abnormality seen. Reproductive:  No mass or other significant abnormality Other:  None. Musculoskeletal: No suspicious bone lesions identified. IMPRESSION: 1. Status post abdominoperineal resection with left lower quadrant end colostomy. 2. Compared to prior CT and MR, no significant change in appearance of heterogeneous, rim enhancing presacral and low pelvic soft tissue and fluid. Largest heterogeneously enhancing conglomerate of fluid and soft tissue appears to closely involve the anteriorly abutting the seminal vesicles and measures 3.0 x 2.9 cm in largest axial dimension. This extends to superiorly contact the posterior bladder dome and inferiorly towards the gluteal cleft. 3. Discrete fluid component at the most inferior extent measuring 2.4 x 1.3 cm. This is markedly diminished in volume compared to more remote previous examinations, for example 06/26/2022. 4. Constellation of findings is highly concerning for locally recurrent rectal malignancy with or without superimposed infection. Presence or absence of infection at this time is not established by MR. 5. Additional unchanged hemorrhagic or proteinaceous fluid collection in the right hemipelvis measuring 3.2 x 2.6 cm most consistent with postoperative hematoma or seroma. 6. Unchanged thickening of the urinary bladder wall. As previously reported, fistula or involvement of bladder wall by malignancy not excluded. Electronically Signed   By: Delanna Ahmadi M.D.   On: 11/15/2022 15:36    Labs:  CBC: Recent Labs    06/26/22 1438 09/04/22 0931 10/13/22 1439 10/27/22 0840  WBC 11.4* 6.5 13.8* 5.1  HGB 13.0 12.5* 11.6* 13.1  HCT 38.7* 37.0* 35.1* 39.3  PLT 208 529* 298 361    COAGS: Recent Labs    10/13/22 1717  INR 1.1  APTT 34    BMP: Recent Labs     06/26/22 1438 09/04/22 0931 10/13/22 1439 10/27/22 0840  NA 140 142 133* 139  K 3.4* 4.1 3.8 3.8  CL 108 108 101 104  CO2 28 28 24 28   GLUCOSE 94 117* 94 110*  BUN 8 11 10 11   CALCIUM 8.4* 9.0 8.2* 8.8*  CREATININE 0.93 1.04 0.82 0.91  GFRNONAA >60 >60 >60 >60  LIVER FUNCTION TESTS: Recent Labs    06/26/22 1438 09/04/22 0931 10/13/22 1439 10/27/22 0840  BILITOT 0.9 0.3 0.6 0.4  AST 14* 19 20 20   ALT 10 19 14 16   ALKPHOS 96 80 87 87  PROT 7.8 8.3* 8.0 8.2*  ALBUMIN 3.2* 3.2* 3.6 3.6    TUMOR MARKERS: No results for input(s): "AFPTM", "CEA", "CA199", "CHROMGRNA" in the last 8760 hours.  Assessment and Plan: Barry [name] is a 57 yo male with history of rectal cancer being seen today in relation to a pelvic mass on recent imaging that is concerning for recurrence of cancer. The patient presents today in their usual state of health and reports that they are NPO. Case has been reviewed and approved by Dr Earleen Newport for image-guided pelvic mass biopsy with moderate sedation.  Risks and benefits of image-guided pelvic mass biopsy was discussed with the patient and/or patient's family including, but not limited to bleeding, infection, damage to adjacent structures or low yield requiring additional tests.  All of the questions were answered and there is agreement to proceed.  Consent signed and in chart.   Thank you for this interesting consult.  I greatly enjoyed meeting Barry [name]. and look forward to participating in their care.  A copy of this report was sent to the requesting provider on this date.  Electronically Signed: Lura Em, PA-C 12/09/2022, 8:18 AM   I spent a total of  30 Minutes   in face to face in clinical consultation, greater than 50% of which was counseling/coordinating care for pelvic mass.

## 2022-12-10 ENCOUNTER — Telehealth: Payer: Self-pay

## 2022-12-10 LAB — SURGICAL PATHOLOGY

## 2022-12-10 NOTE — Telephone Encounter (Signed)
-----   Message from Earlie Server, MD sent at 12/10/2022  1:17 PM EDT ----- Please arrange patient to see me asap. MD visit only.  Please send NGS tissue on his recent biopsy -[phone]

## 2022-12-10 NOTE — Telephone Encounter (Signed)
[facility] NGS request sent on Pelvic mass biopsy,  specimen: [id number], collected 12/09/2022.

## 2022-12-10 NOTE — Telephone Encounter (Signed)
Please call pt to see if he can come tomorrow (chemo slot open @8 :30) for biopsy results. If not then early next week

## 2022-12-11 ENCOUNTER — Inpatient Hospital Stay (HOSPITAL_BASED_OUTPATIENT_CLINIC_OR_DEPARTMENT_OTHER): Payer: 59 | Admitting: Oncology

## 2022-12-11 ENCOUNTER — Encounter: Payer: Self-pay | Admitting: Oncology

## 2022-12-11 VITALS — BP 142/96 | HR 87 | Temp 96.2°F | Resp 18 | Wt 159.3 lb

## 2022-12-11 DIAGNOSIS — Z9221 Personal history of antineoplastic chemotherapy: Secondary | ICD-10-CM | POA: Insufficient documentation

## 2022-12-11 DIAGNOSIS — Z79899 Other long term (current) drug therapy: Secondary | ICD-10-CM | POA: Insufficient documentation

## 2022-12-11 DIAGNOSIS — Z933 Colostomy status: Secondary | ICD-10-CM | POA: Insufficient documentation

## 2022-12-11 DIAGNOSIS — Z9079 Acquired absence of other genital organ(s): Secondary | ICD-10-CM | POA: Insufficient documentation

## 2022-12-11 DIAGNOSIS — K612 Anorectal abscess: Secondary | ICD-10-CM | POA: Insufficient documentation

## 2022-12-11 DIAGNOSIS — R911 Solitary pulmonary nodule: Secondary | ICD-10-CM

## 2022-12-11 DIAGNOSIS — L0231 Cutaneous abscess of buttock: Secondary | ICD-10-CM | POA: Insufficient documentation

## 2022-12-11 DIAGNOSIS — K651 Peritoneal abscess: Secondary | ICD-10-CM | POA: Insufficient documentation

## 2022-12-11 DIAGNOSIS — C20 Malignant neoplasm of rectum: Secondary | ICD-10-CM

## 2022-12-11 DIAGNOSIS — Z95828 Presence of other vascular implants and grafts: Secondary | ICD-10-CM | POA: Diagnosis not present

## 2022-12-11 DIAGNOSIS — Z87891 Personal history of nicotine dependence: Secondary | ICD-10-CM | POA: Insufficient documentation

## 2022-12-11 DIAGNOSIS — Z7189 Other specified counseling: Secondary | ICD-10-CM

## 2022-12-11 DIAGNOSIS — C7989 Secondary malignant neoplasm of other specified sites: Secondary | ICD-10-CM | POA: Insufficient documentation

## 2022-12-11 NOTE — Progress Notes (Signed)
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 Status post APR,Pathology showed rectal adenocarcinoma, ypT3N0. Positive radial margin due to perforation. He has developed recurrent rectal cancer.  Recommend PET restaging for evaluation of distant metastasis.  Consider combined modality therapy with systemic chemotherapy, combined with radiation, possible surgery.   Port-A-Cath in place Continue port flush every 8 weeks.   Lung nodule Previously discussed with IR, CT-guided biopsy is difficult due to the position of small size. Pending PET evaluation.    Orders Placed This Encounter  Procedures   NM PET Image Restage (PS) Skull Base to Thigh (F-18 FDG)    Standing Status:   Future    Standing Expiration Date:   12/11/2023    Order Specific Question:   If indicated for the ordered procedure, I authorize the administration of a radiopharmaceutical per Radiology protocol    Answer:   Yes    Order Specific Question:   Preferred imaging location?    Answer:   [facility]    Order Specific Question:   Radiology Contrast Protocol - do NOT remove file path    Answer:   [email or web address]    Follow-up TBD All questions were answered. The patient knows to call the clinic with any problems, questions or concerns.  Earlie Server, MD, PhD [facility] Hematology Oncology 12/11/2022        HISTORY OF PRESENTING ILLNESS:   Barry Amor. is a  57 y.o.  male presents for  rectal cancer Oncology History  Rectal cancer  02/08/2021 Initial Diagnosis   Rectal cancer   02/05/2021-02/06/2021 patient was hospitalized due to generalized weakness, intermittent lightheadedness, weight loss and worsening constipation.  02/05/2021 CT abdomen showed concerning of severe rectal wall thickening and right internal iliac lymph node concerning for metastatic disease.  Patient was seen by gastroenterology and had colonoscopy which showed a circumferential fungating mass in the rectum.  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   02/14/2021-02/16/2021 hospitalized due to rectal bleeding and pain.   02/14/2021 CT showed perirectal fluid collection/gas concerning for infection with significant leukocytosis, anemia with hemoglobin of 6.8.  Patient received PRBC transfusion, IV antibiotics.  He underwent an IR guided placement of JP drain into the rectal abscess.  Discharged home with oral Augmentin. 02/20/2021 presented to ER with new skin opening and draining from left gluteus.  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  JP drain has been removed.  Patient was continued on Augmentin.   02/13/2021, PET scan showed locally advanced rectal cancer with billowing of the mesorectum now with low attenuation material that was not present on previous examination with extensive stranding and inflammation.   Bulky RIGHT pelvic sidewall/hypogastric lymph node outside of the mesorectum with stippled calcification measuring 19 mm-no increased metabolic activity. Small LEFT hypogastric lymph node just peripheral to the internal external bifurcation-SUV 3.2 High RIGHT internal just below or at the internal/common iliac transition, lymph node -SUV 4.8 LEFT high hypogastric lymph node  8 mm-SUV 3. Scattered lymph nodes throughout the retroperitoneum with low FDG uptake Bilateral inguinal lymph nodes largest on the RIGHT (  image 248/3) 11 mm with a maximum SUV of 2.8 spiculated nodule in the  LEFT upper lobe- 9 x 8 mm       02/14/2021, CT abdomen pelvis without contrast Showed perforated rectal mass with contained perforation with fluid and gas extending above and below the pelvic floor,potentially involving the sphincter complex and extending into LEFT ischial rectal fossa   02/20/2021, CT pelvis with contrast showed Large perirectal/perianal abscess has markedly decreased in size since placement of the percutaneous drain. There are residual gas-filled collections in the soft tissues and suspect there is a fistula or sinus tract between the rectal mass and the subcutaneous tissues. Soft tissue gas along the medial left buttock and concern for a cutaneous ulceration in this area. Large rectal mass with evidence for a large necrotic right pelvic lymph node.   His case is complicated with a perirectal abscess. Prior to onset of perirectal abscess, on his 02/05/2021 scan, he was noted to have right internal iliac lymph node 3 x 1 x 2.3 cm which is concerning for nodal disease.On Subsequent images it was difficult to distinguish whether lymphadenopathy was due to nodal disease versus acute inflammation. 03/07/2021 MRI pelvis cT3 N2. Due to the possible contained perforation on previous CT, possible cT4 disease.    02/27/2021 medi port placed by Dr.Dew 03/13/2021 reports right butt cheek and also left perianal area fullness.he was seen by Dr.Pabon urgently  and had right buttock abscess drained. Left perianal fullness was felt to be due to cancer.    02/08/2021 Cancer Staging   Staging form: Colon and Rectum, AJCC 8th Edition - Clinical stage from 02/08/2021: Stage IIIB (cT4a, cN1, cM0) - Signed by Earlie Server, MD on 03/06/2021 Stage prefix: Initial diagnosis   02/27/2021 Procedure   medi port placed by Dr.Dew   03/18/2021 - 07/05/2021 Chemotherapy    FOLFOX q14d x 4 months      07/17/2021 - 08/27/2021 Chemotherapy   Xeloda concurrent with Radiation   11/18/2021 Surgery   patient is status post  open APR with flap for rectal cancer. Pathology rectal adenomacarcinoma ypT3N0.  Positive margin: Radial (circumferential) or mesenteric: adjacent to perforation      01/27/2022 - 02/02/2022 Hospital Admission   Hospitalized at [facility] due to recurrent abscess.  US guided drain into the pelvic abscess with return of 60 ml of purulent fluid. Cultures positive for staph aureus and strep agalactiae group b, fungal cultures negative.    02/25/2022 Imaging   CT chest angiogram with and without contrast, CT abdomen pelvis with and without contrast 1. Negative for acute pulmonary embolus.2. Emphysema. Further decrease in size of the previously noted irregular nodule in the left upper lobe which is now barely measurable today. No new suspicious lung nodules 3. Status post left lower quadrant colostomy. Further decrease in size since comparison exam from May of the rim enhancing gas and fluid collection at the pelvic surgical bed extending from the perineum superiorly into the pelvis 4. Slightly thickened appearance of terminal ileal small bowel loops in the pelvis with mild stranding suggesting small bowel inflammatory process. 5. Stable enlarged right pelvic sidewall lymph node   06/02/2022 Imaging   CT chest abdomen pelvis w contrast 1. Status post abdominal perineal resection with descending colostomy. 2. At the site of pelvic fluid and gas collection on 02/25/2022, there is residual, decreased presacral soft tissue fullness, without drainable collection. 3. Similar right obturator nodal metastasis.4.  No acute process or evidence of metastatic disease in the chest. 5.  Age advanced coronary artery atherosclerosis. Recommend assessment of coronary risk factors. 6. Aortic atherosclerosis and emphysema     06/26/2022 Imaging   CT abdomen pelvis with contrast showed 1. Interval increase in size of a lobulated partially imaged at least 7.8 x 3.8 x 12 cm abscess in the perianal region in a patient status  post abdominal perineal resection and left lower lobe end colostomy formation. Finding extends to involve the pre sacral region up to the urinary dome level. Associated posterior urinary bladder wall thickening with lack of intraperitoneal fat plane between the presacral soft tissue thickening/abscess formation suggestive of possible fistulization and invasion of the posterior bladder wall. Underlying recurrent malignancy is not excluded. 2. Stable 2.7 cm right pelvic sidewall lymph node. 3.  Aortic Atherosclerosis   06/26/2022 - 06/27/2022 Hospital Admission   He went to [facility] ER and CT showed recurrent abscess. He was transferred to [facility] due to recurrent abscess, treated with IV vanc/zosyn, IR was consulted and drainage tube was placed. Wound grew up Strep viridans. Patient was seen by ID at [facility] discharged with Augmentin for 2 weeks.   He was seen by wound care Dr> Parke Poisson on 07/09/2022, JP drain was removed.    09/29/2022 Imaging   MRI pelvis without contrast showed  Interval abdominoperitoneal resection since prior MRI. Decreased small fluid collection in the surgical bed compared with more recent CT, consistent with resolving postoperative fluid collection or abscess.   Bulky rounded presacral mass, which shows diffuse restricted diffusion, without significant change in size since most recent CT of 06/26/2022. This raises suspicion for recurrent carcinoma over post treatment changes. Recommend correlation with CEA level, and consider tissue sampling or PET-CT.   Stable enlarged right pelvic sidewall lymph node. No new or increased adenopathy identified.   10/09/2022 Imaging   CT chest with contrast showed 1. Unchanged 0.4 cm fissural nodule of the anterior left lower lobe. This is almost certainly a benign fissural lymph node. No new or suspicious pulmonary nodules. 2. Moderate emphysema and diffuse bilateral bronchial wall thickening. 3. Coronary artery disease.   10/13/2022 Imaging   CT  abdomen pelvis with contrast showed 1. Complex collection persists in the lower pelvis, extending from the anal verge upwards into the presacral space and anteriorly from the presacral space to the bladder dome, not significantly changed in size or extent compared to the earlier CT of 06/26/2022. This is most likely a combination of a postoperative seroma and/or chronic phlegmon/abscess versus recurrent abscess. 2. Bladder walls are thick walled/edematous. Given the contiguity of the presacral collection and the bladder dome, this is highly suspicious for a related bladder wall infection and possibly secondary to colovesical fistula. Recommend correlation with urinalysis. 3. No evidence of bowel obstruction. LEFT lower abdominal wall colostomy, without obstruction or inflammatory change. 4. No free intraperitoneal air   10/13/2022 [facility] Admission   Hospitalized due to fever and rectal pain. Urine culture showed mixed urogenital flora. IR CT guided Aspiration of abscess showed Streptococcal Viridans. He was placed back on Augmentin  10/14/2023 CT read by [facility] radiology Postsurgical changes of abdominoperineal resection. Interval enlargement of  fluid collection in the surgical bed. Multiple foci of enhancing tissue at  the margins of this fluid collection, increased from prior study, suspicious for local recurrence.   Prominent retroperitoneal lymph nodes, some which are increased in size from prior study. These are indeterminate and may be either reactive or represent metastatic disease. Recommend continued attention on follow-up.   Centrally hypoattenuating  right obturator lymph node, unchanged in size, consistent with treated disease.   Circumferential bladder wall thickening and irregularity, most pronounced on the left side. This may be reactive in the setting of adjacent pelvic inflammatory changes. Correlate with urinalysis if there is clinical  concern for urinary tract infection.     11/14/2022 Imaging   MRI pelvis w wo contrast  1. Status post abdominoperineal resection with left lower quadrant end colostomy. 2. Compared to prior CT and MR, no significant change in appearance of heterogeneous, rim enhancing presacral and low pelvic soft tissue and fluid. Largest heterogeneously enhancing conglomerate of fluid and soft tissue appears to closely involve the anteriorly abutting the seminal vesicles and measures 3.0 x 2.9 cm in largest axial dimension. This extends to superiorly contact the posterior bladder dome and inferiorly towards the gluteal cleft. 3. Discrete fluid component at the most inferior extent measuring 2.4 x 1.3 cm. This is markedly diminished in volume compared to more remote previous examinations, for example 06/26/2022. 4. Constellation of findings is highly concerning for locally recurrent rectal malignancy with or without superimposed infection. Presence or absence of infection at this time is not established by MR. 5. Additional unchanged hemorrhagic or proteinaceous fluid collection in the right hemipelvis measuring 3.2 x 2.6 cm most consistent with postoperative hematoma or seroma. 6. Unchanged thickening of the urinary bladder wall. As previously reported, fistula or involvement of bladder wall by malignancy not excluded.   12/09/2022 Procedure   CT guided biopsy of the pelvic mass showed Moderately differentiated adenocarcinoma with dirty necrosis, morphologically identical to patient's prior rectal adenocarcinoma.      Patient presented to emergency room on 12/08/21, rectal pain.  CT scan showed 18.3 cm fluid and gas collection in the pelvic surgical bed compatible with infected collection/abscess.  Patient was sent to [facility] and admitted for.  Patient has CT-guided drain placement on 12/09/2021.  Blood culture positive for Staph epidermidis which was felt to be contaminant.  Fluid culture grew pansensitive Staph aureus, strep pyogenes and candida albicans.   Patient was treated with IV antibiotics and transition to p.o. Augmentin and fluconazole on 12/11/2021.  #Perirectal abscess status post drainage catheter , patient was recently seen by [facility] oncology surgeon on 12/24/2021 and was recommended to keep drainage catheter and continue antibiotics. Patient had a repeat CT scan done which showed a smaller but persistent abscess.  He will return to [facility] surgery for follow-up of drain removal. His case was discussed on [name] tumor board for the positive mesenteric margin which was felt to be secondary to previous perforation.  Recommend surveillance.  INTERVAL HISTORY Barry Quattro. is a 57 y.o. male who has above history reviewed by me today presents for follow up visit for management of rectal cancer.  Patient reports "a lot of coughing" but no phlegm, described as a "dry cough." - No shortness of breath reported.    Review of Systems  Constitutional:  Negative for appetite change, chills, fatigue, fever and unexpected weight change.  HENT:   Negative for hearing loss and voice change.   Eyes:  Negative for eye problems and icterus.  Respiratory:  Negative for chest tightness, cough and shortness of breath.   Cardiovascular:  Negative for chest pain and leg swelling.  Gastrointestinal:  Positive for rectal pain. Negative for abdominal distention, abdominal pain, blood in stool, constipation and diarrhea.       Rectal drainage  Endocrine: Negative for hot flashes.  Genitourinary:  Negative for difficulty urinating,  dysuria and frequency.   Musculoskeletal:  Negative for arthralgias.  Skin:  Negative for itching and rash.  Neurological:  Negative for light-headedness and numbness.  Hematological:  Negative for adenopathy. Does not bruise/bleed easily.  Psychiatric/Behavioral:  Negative for confusion.     MEDICAL HISTORY:  Past Medical History:  Diagnosis Date   Headache    Left shoulder pain    Rectal cancer     SURGICAL HISTORY: Past  Surgical History:  Procedure Laterality Date   COLON SURGERY     COLONOSCOPY WITH PROPOFOL N/A 02/06/2021   Procedure: COLONOSCOPY WITH PROPOFOL;  Surgeon: Lin Landsman, MD;  Location: [facility] ENDOSCOPY;  Service: Gastroenterology;  Laterality: N/A;   PORTA CATH INSERTION N/A 02/27/2021   Procedure: PORTA CATH INSERTION;  Surgeon: Algernon Huxley, MD;  Location: [place] CV LAB;  Service: Cardiovascular;  Laterality: N/A;   ROTATOR CUFF REPAIR Left     SOCIAL HISTORY: Social History   Socioeconomic History   Marital status: Married    Spouse name: Not on file   Number of children: Not on file   Years of education: Not on file   Highest education level: Not on file  Occupational History   Not on file  Tobacco Use   Smoking status: Former    Packs/day: 1.00    Years: 10.00    Additional pack years: 0.00    Total pack years: 10.00    Types: Cigars, Cigarettes    Quit date: 02/04/2021    Years since quitting: 1.8   Smokeless tobacco: Never  Substance and Sexual Activity   Alcohol use: Not Currently   Drug use: No   Sexual activity: Not on file  Other Topics Concern   Not on file  Social History Narrative   Not on file   Social Determinants of Health   Financial Resource Strain: Medium Risk (11/03/2022)   Overall Financial Resource Strain (CARDIA)    Difficulty of Paying Living Expenses: Somewhat hard  Food Insecurity: Food Insecurity Present (11/03/2022)   Hunger Vital Sign    Worried About Running Out of Food in the Last Year: Sometimes true    Ran Out of Food in the Last Year: Sometimes true  Transportation Needs: No Transportation Needs (11/03/2022)   PRAPARE - Hydrologist (Medical): No    Lack of Transportation (Non-Medical): No  Physical Activity: Inactive (11/03/2022)   Exercise Vital Sign    Days of Exercise per Week: 0 days    Minutes of Exercise per Session: 0 min  Stress: Stress Concern Present (11/03/2022)   [place]    Feeling of Stress : Rather much  Social Connections: Moderately Isolated (11/03/2022)   Social Connection and Isolation Panel [NHANES]    Frequency of Communication with Friends and Family: Twice a week    Frequency of Social Gatherings with Friends and Family: Three times a week    Attends Religious Services: 1 to 4 times per year    Active Member of Clubs or Organizations: No    Attends Archivist Meetings: Never    Marital Status: Separated  Intimate Partner Violence: Not At Risk (11/03/2022)   Humiliation, Afraid, Rape, and Kick questionnaire    Fear of Current or Ex-Partner: No    Emotionally Abused: No    Physically Abused: No    Sexually Abused: No    FAMILY HISTORY: Family History  Problem Relation Age of Onset  Cancer Sister    Diabetes Mother    Cancer Maternal Grandmother    Cancer Paternal Grandmother     ALLERGIES:  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  MEDICATIONS:  Current Outpatient Medications  Medication Sig Dispense Refill   citalopram (CELEXA) 10 MG tablet Take 1 tablet (10 mg total) by mouth daily. 30 tablet 3   lidocaine-prilocaine (EMLA) cream Apply 1 Application topically as needed. Apply to port and cover with saran wrap 1-2 hours prior to port access 30 g 2   nicotine (NICODERM CQ - DOSED IN MG/24 HOURS) 14 mg/24hr patch Place 1 patch (14 mg total) onto the skin daily. 28 patch 2   traZODone (DESYREL) 50 MG tablet Take 1 tablet (50 mg total) by mouth at bedtime as needed for sleep. 30 tablet 3   amoxicillin-clavulanate (AUGMENTIN) 875-125 MG tablet Take 1 tablet by mouth 2 (two) times daily. (Patient not taking: Reported on 12/11/2022) 28 tablet 0   feeding supplement (ENSURE ENLIVE / ENSURE PLUS) LIQD Take 237 mLs by mouth daily. (Patient not taking: Reported on 10/27/2022) 237 mL 12   nicotine (NICODERM CQ - DOSED IN MG/24 HR) 7 mg/24hr patch Place 1 patch (7 mg total) onto the  skin daily. BYRD fund to pay (Patient not taking: Reported on 09/22/2022) 28 patch 2   potassium chloride SA (KLOR-CON M) 20 MEQ tablet Take 1 tablet (20 mEq total) by mouth daily. BYRD fund to pay (Patient not taking: Reported on 09/22/2022) 3 tablet 0   No current facility-administered medications for this visit.     PHYSICAL EXAMINATION: ECOG PERFORMANCE STATUS: 1 - Symptomatic but completely ambulatory Vitals:   12/11/22 0833  BP: (!) 142/96  Pulse: 87  Resp: 18  Temp: (!) 96.2 F (35.7 C)  SpO2: 100%     Filed Weights   12/11/22 0833  Weight: 159 lb 4.8 oz (72.3 kg)     Physical Exam HENT:     Head: Normocephalic and atraumatic.  Eyes:     General: No scleral icterus. Cardiovascular:     Rate and Rhythm: Normal rate and regular rhythm.     Heart sounds: Normal heart sounds.  Pulmonary:     Effort: Pulmonary effort is normal. No respiratory distress.     Breath sounds: No wheezing.     Comments: Decreased breath sound bilaterally.  Abdominal:     General: Bowel sounds are normal. There is no distension.     Palpations: Abdomen is soft.     Comments: Colostomy bag  Genitourinary:    Comments: Status post APR, Musculoskeletal:        General: No deformity. Normal range of motion.     Cervical back: Normal range of motion and neck supple.  Skin:    General: Skin is warm and dry.     Findings: No erythema or rash.  Neurological:     Mental Status: He is alert and oriented to person, place, and time. Mental status is at baseline.     Cranial Nerves: No cranial nerve deficit.     Coordination: Coordination normal.  Psychiatric:        Mood and Affect: Mood normal.     LABORATORY DATA:  I have reviewed the data as listed    Latest Ref Rng & Units 12/09/2022    7:41 AM 10/27/2022    8:40 AM 10/13/2022    2:39 PM  CBC  WBC 4.0 - 10.5 K/uL 4.9  5.1  13.8   Hemoglobin 13.0 -  17.0 g/dL 14.9  13.1  11.6   Hematocrit 39.0 - 52.0 % 43.8  39.3  35.1   Platelets 150  - 400 K/uL 206  361  298       Latest Ref Rng & Units 10/27/2022    8:40 AM 10/13/2022    2:39 PM 09/04/2022    9:31 AM  CMP  Glucose 70 - 99 mg/dL 110  94  117   BUN 6 - 20 mg/dL 11  10  11    Creatinine 0.61 - 1.24 mg/dL 0.91  0.82  1.04   Sodium 135 - 145 mmol/L 139  133  142   Potassium 3.5 - 5.1 mmol/L 3.8  3.8  4.1   Chloride 98 - 111 mmol/L 104  101  108   CO2 22 - 32 mmol/L 28  24  28    Calcium 8.9 - 10.3 mg/dL 8.8  8.2  9.0   Total Protein 6.5 - 8.1 g/dL 8.2  8.0  8.3   Total Bilirubin 0.3 - 1.2 mg/dL 0.4  0.6  0.3   Alkaline Phos 38 - 126 U/L 87  87  80   AST 15 - 41 U/L 20  20  19    ALT 0 - 44 U/L 16  14  19       RADIOGRAPHIC STUDIES: I have personally reviewed the radiological images as listed and agreed with the findings in the report. CT ABDOMINAL MASS BIOPSY  Result Date: 12/09/2022 INDICATION: 57 year old male referred for biopsy of pelvic mass EXAM: CT BIOPSY MEDICATIONS: None. ANESTHESIA/SEDATION: Moderate (conscious) sedation was employed during this procedure. A total of Versed 2.0 mg and Fentanyl 100 mcg was administered intravenously. Moderate Sedation Time: 20 minutes. The patient's level of consciousness and vital signs were monitored continuously by radiology nursing throughout the procedure under my direct supervision. FLUOROSCOPY TIME:  CT COMPLICATIONS: None PROCEDURE: Informed written consent was obtained from the patient after a thorough discussion of the procedural risks, benefits and alternatives. All questions were addressed. Maximal Sterile Barrier Technique was utilized including caps, mask, sterile gowns, sterile gloves, sterile drape, hand hygiene and skin antiseptic. A timeout was performed prior to the initiation of the procedure. The procedure risks, benefits, and alternatives were explained to the patient. Questions regarding the procedure were encouraged and answered. The patient understands and consents to the procedure. Patient was position prone on  the CT gantry table. Scout CT of the pelvis was performed for surgical planning purposes. The posterior pelvis was prepped with Chlorhexidine in a sterile fashion, and a sterile drape was applied covering the operative field. A sterile gown and sterile gloves were used for the procedure. Local anesthesia was provided with 1% Lidocaine. Using CT guidance, introducer needle was advanced into the soft tissue mass at the level of the coccyx. Once we confirmed needle tip position multiple 18 gauge core biopsies were performed with images placed into formalin solution. Needle was removed and a final CT was acquired. Manual pressure was used for hemostasis and a sterile dressing was placed. No complications were encountered no significant blood loss was encountered. Patient tolerated the procedure well and remained hemodynamically stable throughout. IMPRESSION: Status post CT-guided biopsy of pelvic mass. Signed, Dulcy Fanny. Nadene Rubins, RPVI [facility] Electronically Signed   By: Corrie Mckusick D.O.   On: 12/09/2022 10:10   MR PELVIS W WO CONTRAST  Result Date: 11/15/2022 CLINICAL DATA:  Presacral mass, history of rectal cancer status post resection  with colostomy EXAM: MRI PELVIS WITHOUT AND WITH CONTRAST TECHNIQUE: Multiplanar multisequence MR imaging of the pelvis was performed both before and after administration of intravenous contrast. CONTRAST:  77mL GADAVIST GADOBUTROL 1 MMOL/ML IV SOLN COMPARISON:  CT abdomen pelvis, 10/13/2022, MR pelvis, 09/29/2018 FINDINGS: Urinary Tract: Unchanged thickening of the urinary bladder wall (series 8, image 22). Bowel: Status post abdominoperineal resection with left lower quadrant end colostomy. Compared to prior CT and MR, no significant change in appearance of heterogeneous, rim enhancing presacral and low pelvic soft tissue and fluid. Largest heterogeneously enhancing conglomerate of fluid and soft tissue appears  to closely involve the anteriorly abutting the seminal vesicles and measures 3.0 x 2.9 cm in largest axial dimension (series 11, image 22). This extends to superiorly contact the posterior bladder dome (series 11, image 15, series 5, image 17) and inferiorly towards the gluteal cleft. There is a fluid component inferiorly measuring 2.4 x 1.3 cm (series 11, image 29). Additional intrinsically T1 hyperintense, rim enhancing hemorrhagic or proteinaceous fluid collection in the right hemipelvis measuring 3.2 x 2.6 cm (series 11, image 19). Vascular/Lymphatic: No pathologically enlarged lymph nodes. No significant vascular abnormality seen. Reproductive:  No mass or other significant abnormality Other:  None. Musculoskeletal: No suspicious bone lesions identified. IMPRESSION: 1. Status post abdominoperineal resection with left lower quadrant end colostomy. 2. Compared to prior CT and MR, no significant change in appearance of heterogeneous, rim enhancing presacral and low pelvic soft tissue and fluid. Largest heterogeneously enhancing conglomerate of fluid and soft tissue appears to closely involve the anteriorly abutting the seminal vesicles and measures 3.0 x 2.9 cm in largest axial dimension. This extends to superiorly contact the posterior bladder dome and inferiorly towards the gluteal cleft. 3. Discrete fluid component at the most inferior extent measuring 2.4 x 1.3 cm. This is markedly diminished in volume compared to more remote previous examinations, for example 06/26/2022. 4. Constellation of findings is highly concerning for locally recurrent rectal malignancy with or without superimposed infection. Presence or absence of infection at this time is not established by MR. 5. Additional unchanged hemorrhagic or proteinaceous fluid collection in the right hemipelvis measuring 3.2 x 2.6 cm most consistent with postoperative hematoma or seroma. 6. Unchanged thickening of the urinary bladder wall. As previously  reported, fistula or involvement of bladder wall by malignancy not excluded. Electronically Signed   By: Delanna Ahmadi M.D.   On: 11/15/2022 15:36   CT ABDOMEN PELVIS W CONTRAST  Result Date: 10/13/2022 CLINICAL DATA:  Abdominal pain, postop rectal pain. EXAM: CT ABDOMEN AND PELVIS WITH CONTRAST TECHNIQUE: Multidetector CT imaging of the abdomen and pelvis was performed using the standard protocol following bolus administration of intravenous contrast. RADIATION DOSE REDUCTION: This exam was performed according to the departmental dose-optimization program which includes automated exposure control, adjustment of the mA and/or kV according to patient size and/or use of iterative reconstruction technique. CONTRAST:  117mL OMNIPAQUE IOHEXOL 300 MG/ML  SOLN COMPARISON:  CT abdomen dated 06/26/2022. FINDINGS: Lower chest: No acute abnormality. Hepatobiliary: No acute or suspicious findings within the liver. Gallbladder is unremarkable. No bile duct dilatation is seen. Pancreas: Unremarkable. No pancreatic ductal dilatation or surrounding inflammatory changes. Spleen: Normal in size without focal abnormality. Adrenals/Urinary Tract: Adrenal glands are unremarkable. Kidneys are unremarkable without suspicious mass, stone or hydronephrosis. Bladder walls are thick walled/edematous. Stomach/Bowel: No dilated large or small bowel loops. LEFT lower abdominal wall colostomy, without obstruction or inflammatory change. Vascular/Lymphatic: Aortic atherosclerosis. No acute-appearing vascular abnormality. Reproductive: History  of prostatectomy. Other: Complex hypodense collection extending from the anal verge inferiorly into the presacral space and anteriorly from the presacral space to the bladder dome, not significantly changed in size or extent compared to the earlier CT of 06/26/2022. Stable rounded fluid collection along the posterior portion of the RIGHT pelvic sidewall, not contiguous with the aforementioned fluid  collection in the presacral space and therefore most likely an unrelated postsurgical seroma. Musculoskeletal: No acute-appearing osseous abnormality. No evidence of osteomyelitis. IMPRESSION: 1. Complex collection persists in the lower pelvis, extending from the anal verge upwards into the presacral space and anteriorly from the presacral space to the bladder dome, not significantly changed in size or extent compared to the earlier CT of 06/26/2022. This is most likely a combination of a postoperative seroma and/or chronic phlegmon/abscess versus recurrent abscess. 2. Bladder walls are thick walled/edematous. Given the contiguity of the presacral collection and the bladder dome, this is highly suspicious for a related bladder wall infection and possibly secondary to colovesical fistula. Recommend correlation with urinalysis. 3. No evidence of bowel obstruction. LEFT lower abdominal wall colostomy, without obstruction or inflammatory change. 4. No free intraperitoneal air. Aortic Atherosclerosis (ICD10-I70.0). Electronically Signed   By: Franki Cabot M.D.   On: 10/13/2022 17:53   DG Chest Port 1 View  Result Date: 10/13/2022 CLINICAL DATA:  Sepsis EXAM: PORTABLE CHEST 1 VIEW COMPARISON:  06/26/2022, 10/09/2022 FINDINGS: Right IJ approach chest port remains in place. The heart size and mediastinal contours are within normal limits. Both lungs are clear. The visualized skeletal structures are unremarkable. IMPRESSION: No active disease. Electronically Signed   By: Davina Poke D.O.   On: 10/13/2022 16:47   CT Chest W Contrast  Result Date: 10/09/2022 CLINICAL DATA:  Rectal cancer, chest staging follow-up lung nodule * Tracking Code: BO * EXAM: CT CHEST WITH CONTRAST TECHNIQUE: Multidetector CT imaging of the chest was performed during intravenous contrast administration. RADIATION DOSE REDUCTION: This exam was performed according to the departmental dose-optimization program which includes automated  exposure control, adjustment of the mA and/or kV according to patient size and/or use of iterative reconstruction technique. CONTRAST:  49mL OMNIPAQUE IOHEXOL 300 MG/ML  SOLN COMPARISON:  CT chest abdomen pelvis, 06/02/2022 FINDINGS: Cardiovascular: Right chest port catheter. Scattered aortic atherosclerosis. Normal heart size. Left coronary artery calcifications. No pericardial effusion. Mediastinum/Nodes: No enlarged mediastinal, hilar, or axillary lymph nodes. Thymic remnant in the anterior mediastinum. Thyroid gland, trachea, and esophagus demonstrate no significant findings. Lungs/Pleura: Moderate centrilobular emphysema. Diffuse bilateral bronchial wall thickening. Unchanged 0.4 cm fissural nodule of the anterior left lower lobe (series 3, image 83). No pleural effusion or pneumothorax. Upper Abdomen: No acute abnormality. Musculoskeletal: No chest wall abnormality. No acute osseous findings. IMPRESSION: 1. Unchanged 0.4 cm fissural nodule of the anterior left lower lobe. This is almost certainly a benign fissural lymph node. No new or suspicious pulmonary nodules. 2. Moderate emphysema and diffuse bilateral bronchial wall thickening. 3. Coronary artery disease. Aortic Atherosclerosis (ICD10-I70.0) and Emphysema (ICD10-J43.9). Electronically Signed   By: Delanna Ahmadi M.D.   On: 10/09/2022 14:21   MR Pelvis Wo Contrast  Result Date: 10/01/2022 CLINICAL DATA:  Follow-up locally advanced rectal carcinoma. Status post abdominoperitoneal resection, chemotherapy, and radiation therapy. EXAM: MRI PELVIS WITHOUT CONTRAST TECHNIQUE: Multiplanar multisequence MR imaging of the pelvis was performed. No intravenous contrast was administered. Ultrasound gel was administered per rectum to optimize tumor evaluation. COMPARISON:  MRI on 03/07/2021, and CT on 06/26/22 FINDINGS: Lower Urinary Tract: Unremarkable urinary bladder.  Bowel: Patient has undergone abdominoperitoneal resection since prior MRI. A small fluid  collection is seen in the surgical bed measuring 1.7 x 1.2 cm, significantly decreased from 7.3 x 4.0 cm on most recent CT. This is consistent with resolving postoperative fluid collection or abscess. A heterogeneous T2 hypointense presacral mass is seen. Comparison with prior preop MRI is difficult, but this does show considerable decrease in bulkiness since the original preop MRI. This currently measures 4.3 by 3.0 x 3.4 cm on images 14/8 and 30/9, and shows no significant change in size compared to recent CT on 06/26/2022. This mass does show diffuse restricted diffusion, and is relatively bulky in appearance. These features favor recurrent carcinoma over posttreatment changes J Vascular/Lymphatic: Enlarged pelvic sidewall lymph node measures 3.2 x 2.5 cm on image 9/8, without significant change since previous study. No new or increased lymphadenopathy identified. Reproductive:  Unremarkable prostate. Other: None. Musculoskeletal: No suspicious bone lesions identified. IMPRESSION: Interval abdominoperitoneal resection since prior MRI. Decreased small fluid collection in the surgical bed compared with more recent CT, consistent with resolving postoperative fluid collection or abscess. Bulky rounded presacral mass, which shows diffuse restricted diffusion, without significant change in size since most recent CT of 06/26/2022. This raises suspicion for recurrent carcinoma over post treatment changes. Recommend correlation with CEA level, and consider tissue sampling or PET-CT. Stable enlarged right pelvic sidewall lymph node. No new or increased adenopathy identified. Electronically Signed   By: Marlaine Hind M.D.   On: 10/01/2022 10:48

## 2022-12-11 NOTE — Assessment & Plan Note (Addendum)
Anticipate near future use of port for chemotherapy.

## 2022-12-11 NOTE — Assessment & Plan Note (Signed)
Previously discussed with IR, CT-guided biopsy is difficult due to the position of small size. Pending PET evaluation.

## 2022-12-11 NOTE — Assessment & Plan Note (Addendum)
locally advanced rectal cancer-Stage IIIB, s/p TNT neoadjuvant protocol, concurrent Xeloda and radiation-Finished 08/27/2021, s/p  APR,rectal adenocarcinoma, ypT3N0. Positive radial margin due to perforation. Biopsy results discussed. He has developed recurrent rectal cancer, at least locally advanced disease, possible colovesical fistula, he also has lung nodules which need further evaluation Recommend PET restaging for evaluation of distant metastasis.  Consider combined modality therapy with systemic chemotherapy, combined with radiation, possible surgery.  Obtain NGS

## 2022-12-11 NOTE — Assessment & Plan Note (Signed)
Discussed with patient

## 2022-12-11 NOTE — Telephone Encounter (Signed)
Financial assistance form submitted and out-of-pocket cost will be $0

## 2022-12-12 ENCOUNTER — Telehealth: Payer: Self-pay | Admitting: Oncology

## 2022-12-12 NOTE — Telephone Encounter (Signed)
Case is still pending~P2P not an option until final decision has been made

## 2022-12-17 ENCOUNTER — Ambulatory Visit
Admission: RE | Admit: 2022-12-17 | Discharge: 2022-12-17 | Disposition: A | Discharge status: Home / Self Care | Payer: 59 | Source: Ambulatory Visit | Attending: Oncology | Admitting: Oncology

## 2022-12-17 ENCOUNTER — Ambulatory Visit: Payer: 59

## 2022-12-17 DIAGNOSIS — C20 Malignant neoplasm of rectum: Secondary | ICD-10-CM | POA: Insufficient documentation

## 2022-12-17 DIAGNOSIS — J439 Emphysema, unspecified: Secondary | ICD-10-CM | POA: Diagnosis not present

## 2022-12-17 DIAGNOSIS — I700 Atherosclerosis of aorta: Secondary | ICD-10-CM | POA: Insufficient documentation

## 2022-12-17 LAB — GLUCOSE, CAPILLARY: Glucose-Capillary: 85 mg/dL (ref 70–99)

## 2022-12-17 MED ORDER — FLUDEOXYGLUCOSE F - 18 (FDG) INJECTION
8.2000 | Freq: Once | INTRAVENOUS | Status: AC | PRN
  Administered 2022-12-17: 8.86 via INTRAVENOUS

## 2022-12-19 ENCOUNTER — Telehealth: Payer: Self-pay

## 2022-12-19 DIAGNOSIS — C20 Malignant neoplasm of rectum: Secondary | ICD-10-CM | POA: Diagnosis not present

## 2022-12-19 NOTE — Telephone Encounter (Signed)
xT report sent to scan.  

## 2022-12-19 NOTE — Telephone Encounter (Signed)
Called and spoke to pt. Informed him of follow up plan to see Dr. Cathie Hoops and Dr. Rushie Chestnut.   Kim/ Lawson Fiscal, pt has seen Dr. Rushie Chestnut in the past. Will need to see Dr. Rushie Chestnut again for rectal cancer recurrence.   Please schedule pt to see Dr. Cathie Hoops and Dr. Rushie Chestnut- next week

## 2022-12-19 NOTE — Telephone Encounter (Signed)
-----   Message from [name], MD sent at 12/18/2022  8:34 PM EDT ----- Barry [name], would you please reach out to Dr. Luciano Cutter and ask him to give me a call?  Patient has developed recurrence, biopsy proven. No distant mets.  I would like to check if disease is still resectable. If not I plan radiation and systemic chemotherapy  Lanora Manis, please arrange him to see Dr. Rushie Chestnut and me for discussion.

## 2022-12-22 ENCOUNTER — Encounter: Payer: Self-pay | Admitting: Oncology

## 2022-12-23 ENCOUNTER — Encounter: Payer: Self-pay | Admitting: Oncology

## 2022-12-23 NOTE — Telephone Encounter (Signed)
xR and RNA report sent to scan

## 2022-12-24 ENCOUNTER — Encounter: Payer: Self-pay | Admitting: Oncology

## 2022-12-26 ENCOUNTER — Ambulatory Visit
Admission: RE | Admit: 2022-12-26 | Discharge: 2022-12-26 | Disposition: A | Discharge status: Home / Self Care | Payer: 59 | Source: Ambulatory Visit | Attending: Radiation Oncology | Admitting: Radiation Oncology

## 2022-12-26 ENCOUNTER — Inpatient Hospital Stay (HOSPITAL_BASED_OUTPATIENT_CLINIC_OR_DEPARTMENT_OTHER): Payer: 59 | Admitting: Oncology

## 2022-12-26 ENCOUNTER — Encounter: Payer: Self-pay | Admitting: Oncology

## 2022-12-26 VITALS — BP 138/78 | HR 90 | Temp 98.0°F | Resp 17

## 2022-12-26 DIAGNOSIS — Z933 Colostomy status: Secondary | ICD-10-CM | POA: Insufficient documentation

## 2022-12-26 DIAGNOSIS — Z9221 Personal history of antineoplastic chemotherapy: Secondary | ICD-10-CM | POA: Insufficient documentation

## 2022-12-26 DIAGNOSIS — R911 Solitary pulmonary nodule: Secondary | ICD-10-CM | POA: Diagnosis not present

## 2022-12-26 DIAGNOSIS — C20 Malignant neoplasm of rectum: Secondary | ICD-10-CM | POA: Insufficient documentation

## 2022-12-26 MED ORDER — CAPECITABINE 500 MG PO TABS: 825.0000 mg/m2 | ORAL_TABLET | Freq: Two times a day (BID) | ORAL | 0 refills | Status: DC

## 2022-12-26 NOTE — Progress Notes (Signed)
Radiation Oncology Follow up Note  Name: Barry [name].   Date:   12/26/2022 MRN:  [id number] DOB: [birth year]    This 57 y.o. male presents to the clinic today for recurrent rectal cancer in patient.  Treated with concurrent chemoradiation therapy in a neoadjuvant fashion prior to APR for locally advanced rectal cancer  REFERRING PROVIDER: No ref. provider found  HPI: Patient is a 57 year old male well-known to our department having been treated back in 22 for locally advanced.  Stage IIIb (cT4a N1 M0) adenocarcinoma of the rectum.  He completed FOLFOX chemotherapy then underwent concurrent chemoradiation therapy.  He developed what was thought to be an abscess back in October with CT scan showing a 7.8 x 3.8 cm abscess in the perianal region.  MRI in January 24 showing bulky rounded presacral mass showing diffuse restricted diffusion raising concern for recurrent disease.  CT-guided biopsy of the pelvic mass showed moderate differentiated adenocarcinoma with 30 necrosis identical to patient's prior rectal adenocarcinoma.  He has had intermittent rectal pain although that is under control at the present time.  He is seen today for consideration of salvage radiation therapy along with concurrent chemotherapy.  Patient's recent PET CT scan showed FDG avid activity in a soft tissue mass in the surgical bed status post low anterior resection indicative of disease recurrence no evidence of distant metastatic disease was noted.  COMPLICATIONS OF TREATMENT: none  FOLLOW UP COMPLIANCE: keeps appointments   PHYSICAL EXAM:  There were no vitals taken for this visit. Patient has functioning colostomy.  Well-developed well-nourished patient in NAD. HEENT reveals PERLA, EOMI, discs not visualized.  Oral cavity is clear. No oral mucosal lesions are identified. Neck is clear without evidence of cervical or supraclavicular adenopathy. Lungs are clear to A&P. Cardiac examination is essentially unremarkable  with regular rate and rhythm without murmur rub or thrill. Abdomen is benign with no organomegaly or masses noted. Motor sensory and DTR levels are equal and symmetric in the upper and lower extremities. Cranial nerves II through XII are grossly intact. Proprioception is intact. No peripheral adenopathy or edema is identified. No motor or sensory levels are noted. Crude visual fields are within normal range.  RADIOLOGY RESULTS: MRI scans PET scan CT scans reviewed.  PLAN: This time elect go ahead with concurrent chemoradiation therapy.  I have reviewed the literature on salvage radiation therapy after concurrent chemoradiation therapy in a neoadjuvant fashion for rectal cancer.  Doses range from 40-65 Gray..  I will plan on delivering 45 Gray in 25 fractions using IMRT treatment planning and delivery along with concurrent chemotherapy.  I would choose IMRT based on the previous area of treatment to spare significant risk of further normal tissue involvement.  I will use PET CT scan and effusion to delineate my treatment fields.  Risks and benefits of treatment occluding possible increased lower urinary tract symptoms skin reaction fatigue alteration of blood counts and risks of further soft tissue damage based on his previous radiated fields all were discussed in detail with the patient.  I do not feel he will have bowel problems since he has already been diverted with colostomy.  I have personally set up and ordered CT simulation for next week.  I would like to take this opportunity to thank you for allowing me to participate in the care of your patient.Carmina Miller, MD

## 2022-12-26 NOTE — Progress Notes (Signed)
Patient here for oncology follow-up appointment, expresses no complaints or concerns at this time.    

## 2022-12-26 NOTE — Assessment & Plan Note (Signed)
Previously discussed with IR, CT-guided biopsy is difficult due to the position of small size. Negative on PET evaluation.

## 2022-12-26 NOTE — Progress Notes (Signed)
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 Positive radial margin due to perforation. Biopsy proven recurrent rectal cancer, locally advanced disease, possible colovesical fistula, PET scan did not showed distant metastatic disease.  I have discussed with his [name] Surgeon Dr. Luciano [name], recurrence is not resectable.  Recommend combined modality therapy with concurrent Xeloda 825 mg/m2 twice daily  with radiation,  Currently he has no UTI symptoms. For possible colovesical fistula, I will refer him to establish care with urology   Lung nodule Previously discussed with IR, CT-guided biopsy is difficult due to the position of small size. Negative on PET evaluation.     Orders Placed This Encounter  Procedures   Ambulatory referral to Urology    Referral Priority:   Routine    Referral Type:   Consultation    Referral Reason:   Specialty Services Required    Referred to Provider:   Vanna Scotland, MD    Requested Specialty:   Urology    Number of Visits Requested:   1    Follow-up TBD All questions were answered. The patient knows to call the clinic with any problems, questions or concerns.  Barry Patience, MD, PhD [facility] Hematology Oncology 12/26/2022        HISTORY OF PRESENTING ILLNESS:   Barry [name]. is a  57 y.o.  male presents for rectal cancer Oncology History  Rectal cancer  02/08/2021 Initial Diagnosis   Rectal cancer   02/05/2021-02/06/2021 patient was  hospitalized due to generalized weakness, intermittent lightheadedness, weight loss and worsening constipation.  02/05/2021 CT abdomen showed concerning of severe rectal wall thickening and right internal iliac lymph node concerning for metastatic disease.  Patient was seen by gastroenterology and had colonoscopy which showed a circumferential fungating mass in the rectum.  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   02/14/2021-02/16/2021 hospitalized due to rectal bleeding and pain.   02/14/2021 CT showed perirectal fluid collection/gas concerning for infection with significant leukocytosis, anemia with hemoglobin of 6.8.  Patient received PRBC transfusion, IV antibiotics.  He underwent an IR guided placement of JP drain into the rectal abscess.  Discharged home with oral Augmentin. 02/20/2021 presented to ER with new skin opening and draining from left gluteus.  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  JP drain has been removed.  Patient was continued on Augmentin.   02/13/2021, PET scan showed locally advanced rectal cancer with billowing of the mesorectum now with low attenuation material that was not present on previous examination with extensive stranding and inflammation.   Bulky RIGHT pelvic sidewall/hypogastric lymph node outside of the mesorectum with stippled calcification measuring 19 mm-no increased metabolic activity. Small LEFT hypogastric lymph node just peripheral to the internal external bifurcation-SUV 3.2 High RIGHT internal just below or at the internal/common iliac transition, lymph node -SUV 4.8 LEFT high hypogastric lymph node  8 mm-SUV 3. Scattered lymph nodes throughout the retroperitoneum with low FDG uptake Bilateral inguinal lymph nodes largest on the RIGHT (image 248/3) 11 mm with a maximum SUV of 2.8 spiculated nodule in  the LEFT upper lobe- 9 x 8 mm       02/14/2021, CT abdomen pelvis without contrast Showed perforated rectal mass with contained  perforation with fluid and gas extending above and below the pelvic floor,potentially involving the sphincter complex and extending into LEFT ischial rectal fossa   02/20/2021, CT pelvis with contrast showed Large perirectal/perianal abscess has markedly decreased in size since placement of the percutaneous drain. There are residual gas-filled collections in the soft tissues and suspect there is a fistula or sinus tract between the rectal mass and the subcutaneous tissues. Soft tissue gas along the medial left buttock and concern for a cutaneous ulceration in this area. Large rectal mass with evidence for a large necrotic right pelvic lymph node.   His case is complicated with a perirectal abscess. Prior to onset of perirectal abscess, on his 02/05/2021 scan, he was noted to have right internal iliac lymph node 3 x 1 x 2.3 cm which is concerning for nodal disease.On Subsequent images it was difficult to distinguish whether lymphadenopathy was due to nodal disease versus acute inflammation. 03/07/2021 MRI pelvis cT3 N2. Due to the possible contained perforation on previous CT, possible cT4 disease.    02/27/2021 medi port placed by Dr.Dew 03/13/2021 reports right butt cheek and also left perianal area fullness.he was seen by Dr.Pabon urgently  and had right buttock abscess drained. Left perianal fullness was felt to be due to cancer.    02/08/2021 Cancer Staging   Staging form: Colon and Rectum, AJCC 8th Edition - Clinical stage from 02/08/2021: Stage IIIB (cT4a, cN1, cM0) - Signed by Barry Patience, MD on 03/06/2021 Stage prefix: Initial diagnosis   02/27/2021 Procedure   medi port placed by Dr.Dew   03/18/2021 - 07/05/2021 Chemotherapy    FOLFOX q14d x 4 months      07/17/2021 - 08/27/2021 Chemotherapy   Xeloda concurrent with Radiation   11/18/2021 Surgery   patient is status post open APR with flap for rectal cancer. Pathology rectal adenomacarcinoma ypT3N0.  Positive margin: Radial (circumferential) or  mesenteric: adjacent to perforation      01/27/2022 - 02/02/2022 Hospital Admission   Hospitalized at [facility] due to recurrent abscess.  US guided drain into the pelvic abscess with return of 60 ml of purulent fluid. Cultures positive for staph aureus and strep agalactiae group b, fungal cultures negative.    02/25/2022 Imaging   CT chest angiogram with and without contrast, CT abdomen pelvis with and without contrast 1. Negative for acute pulmonary embolus.2. Emphysema. Further decrease in size of the previously noted irregular nodule in the left upper lobe which is now barely measurable today. No new suspicious lung nodules 3. Status post left lower quadrant colostomy. Further decrease in size since comparison exam from May of the rim enhancing gas and fluid collection at the pelvic surgical bed extending from the perineum superiorly into the pelvis 4. Slightly thickened appearance of terminal ileal small bowel loops in the pelvis with mild stranding suggesting small bowel inflammatory process. 5. Stable enlarged right pelvic sidewall lymph node   06/02/2022 Imaging   CT chest abdomen pelvis w contrast 1. Status post abdominal perineal resection with descending colostomy. 2. At the site of pelvic fluid and gas collection on 02/25/2022, there is residual, decreased presacral soft tissue fullness, without drainable collection. 3. Similar right obturator nodal metastasis.4.  No acute process or evidence of metastatic disease in the chest. 5. Age advanced coronary artery atherosclerosis. Recommend assessment of coronary risk factors. 6. Aortic  atherosclerosis and emphysema     06/26/2022 Imaging   CT abdomen pelvis with contrast showed 1. Interval increase in size of a lobulated partially imaged at least 7.8 x 3.8 x 12 cm abscess in the perianal region in a patient status post abdominal perineal resection and left lower lobe end colostomy formation. Finding extends to involve the pre  sacral region up to the urinary dome level. Associated posterior urinary bladder wall thickening with lack of intraperitoneal fat plane between the presacral soft tissue thickening/abscess formation suggestive of possible fistulization and invasion of the posterior bladder wall. Underlying recurrent malignancy is not excluded. 2. Stable 2.7 cm right pelvic sidewall lymph node. 3.  Aortic Atherosclerosis   06/26/2022 - 06/27/2022 Hospital Admission   He went to [facility] ER and CT showed recurrent abscess. He was transferred to [facility] due to recurrent abscess, treated with IV vanc/zosyn, IR was consulted and drainage tube was placed. Wound grew up Strep viridans. Patient was seen by ID at [facility] discharged with Augmentin for 2 weeks.   He was seen by wound care Dr> Kandace Blitz on 07/09/2022, JP drain was removed.    09/29/2022 Imaging   MRI pelvis without contrast showed  Interval abdominoperitoneal resection since prior MRI. Decreased small fluid collection in the surgical bed compared with more recent CT, consistent with resolving postoperative fluid collection or abscess.   Bulky rounded presacral mass, which shows diffuse restricted diffusion, without significant change in size since most recent CT of 06/26/2022. This raises suspicion for recurrent carcinoma over post treatment changes. Recommend correlation with CEA level, and consider tissue sampling or PET-CT.   Stable enlarged right pelvic sidewall lymph node. No new or increased adenopathy identified.   10/09/2022 Imaging   CT chest with contrast showed 1. Unchanged 0.4 cm fissural nodule of the anterior left lower lobe. This is almost certainly a benign fissural lymph node. No new or suspicious pulmonary nodules. 2. Moderate emphysema and diffuse bilateral bronchial wall thickening. 3. Coronary artery disease.   10/13/2022 Imaging   CT abdomen pelvis with contrast showed 1. Complex collection persists in the lower pelvis, extending from the anal  verge upwards into the presacral space and anteriorly from the presacral space to the bladder dome, not significantly changed in size or extent compared to the earlier CT of 06/26/2022. This is most likely a combination of a postoperative seroma and/or chronic phlegmon/abscess versus recurrent abscess. 2. Bladder walls are thick walled/edematous. Given the contiguity of the presacral collection and the bladder dome, this is highly suspicious for a related bladder wall infection and possibly secondary to colovesical fistula. Recommend correlation with urinalysis. 3. No evidence of bowel obstruction. LEFT lower abdominal wall colostomy, without obstruction or inflammatory change. 4. No free intraperitoneal air   10/13/2022 [facility] Admission   Hospitalized due to fever and rectal pain. Urine culture showed mixed urogenital flora. IR CT guided Aspiration of abscess showed Streptococcal Viridans. He was placed back on Augmentin  10/14/2023 CT read by [facility] radiology Postsurgical changes of abdominoperineal resection. Interval enlargement of  fluid collection in the surgical bed. Multiple foci of enhancing tissue at  the margins of this fluid collection, increased from prior study, suspicious for local recurrence.   Prominent retroperitoneal lymph nodes, some which are increased in size from prior study. These are indeterminate and may be either reactive or represent metastatic disease. Recommend continued attention on follow-up.   Centrally hypoattenuating right obturator lymph node, unchanged in size, consistent with treated disease.  Circumferential bladder wall thickening and irregularity, most pronounced on the left side. This may be reactive in the setting of adjacent pelvic inflammatory changes. Correlate with urinalysis if there is clinical  concern for urinary tract infection.    11/14/2022 Imaging   MRI pelvis w wo contrast  1. Status post abdominoperineal resection with left lower quadrant end  colostomy. 2. Compared to prior CT and MR, no significant change in appearance of heterogeneous, rim enhancing presacral and low pelvic soft tissue and fluid. Largest heterogeneously enhancing conglomerate of fluid and soft tissue appears to closely involve the anteriorly abutting the seminal vesicles and measures 3.0 x 2.9 cm in largest axial dimension. This extends to superiorly contact the posterior bladder dome and inferiorly towards the gluteal cleft. 3. Discrete fluid component at the most inferior extent measuring 2.4 x 1.3 cm. This is markedly diminished in volume compared to more remote previous examinations, for example 06/26/2022. 4. Constellation of findings is highly concerning for locally recurrent rectal malignancy with or without superimposed infection. Presence or absence of infection at this time is not established by MR. 5. Additional unchanged hemorrhagic or proteinaceous fluid collection in the right hemipelvis measuring 3.2 x 2.6 cm most consistent with postoperative hematoma or seroma. 6. Unchanged thickening of the urinary bladder wall. As previously reported, fistula or involvement of bladder wall by malignancy not excluded.   12/09/2022 Procedure   CT guided biopsy of the pelvic mass showed Moderately differentiated adenocarcinoma with dirty necrosis, morphologically identical to patient's prior rectal adenocarcinoma.      Patient presented to emergency room on 12/08/21, rectal pain.  CT scan showed 18.3 cm fluid and gas collection in the pelvic surgical bed compatible with infected collection/abscess.  Patient was sent to [facility] and admitted for.  Patient has CT-guided drain placement on 12/09/2021.  Blood culture positive for Staph epidermidis which was felt to be contaminant.  Fluid culture grew pansensitive Staph aureus, strep pyogenes and candida albicans.  Patient was treated with IV antibiotics and transition to p.o. Augmentin and fluconazole on 12/11/2021.  #Perirectal abscess  status post drainage catheter , patient was recently seen by [facility] oncology surgeon on 12/24/2021 and was recommended to keep drainage catheter and continue antibiotics. Patient had a repeat CT scan done which showed a smaller but persistent abscess.  He will return to [facility] surgery for follow-up of drain removal. His case was discussed on [name] tumor board for the positive mesenteric margin which was felt to be secondary to previous perforation.  Recommend surveillance.  INTERVAL HISTORY Josephus Harriger. is a 57 y.o. male who has above history reviewed by me today presents for follow up visit for management of recurrent locally advanced rectal cancer. He presents to discuss PET scan.  Denies dysuria, fever chills, rectal pain.   Review of Systems  Constitutional:  Negative for appetite change, chills, fatigue, fever and unexpected weight change.  HENT:   Negative for hearing loss and voice change.   Eyes:  Negative for eye problems and icterus.  Respiratory:  Negative for chest tightness, cough and shortness of breath.   Cardiovascular:  Negative for chest pain and leg swelling.  Gastrointestinal:  Negative for abdominal distention, abdominal pain, blood in stool, constipation, diarrhea and rectal pain.       Very occasional rectal drainage  Endocrine: Negative for hot flashes.  Genitourinary:  Negative for difficulty urinating, dysuria and frequency.   Musculoskeletal:  Negative for arthralgias.  Skin:  Negative for itching and rash.  Neurological:  Negative for light-headedness and numbness.  Hematological:  Negative for adenopathy. Does not bruise/bleed easily.  Psychiatric/Behavioral:  Negative for confusion.     MEDICAL HISTORY:  Past Medical History:  Diagnosis Date   Headache    Left shoulder pain    Rectal cancer     SURGICAL HISTORY: Past Surgical History:  Procedure Laterality Date   COLON SURGERY     COLONOSCOPY WITH PROPOFOL N/A 02/06/2021   Procedure: COLONOSCOPY  WITH PROPOFOL;  Surgeon: Toney Reil, MD;  Location: [facility] ENDOSCOPY;  Service: Gastroenterology;  Laterality: N/A;   PORTA CATH INSERTION N/A 02/27/2021   Procedure: PORTA CATH INSERTION;  Surgeon: Annice Needy, MD;  Location: [facility] INVASIVE CV LAB;  Service: Cardiovascular;  Laterality: N/A;   ROTATOR CUFF REPAIR Left     SOCIAL HISTORY: Social History   Socioeconomic History   Marital status: Married    Spouse name: Not on file   Number of children: Not on file   Years of education: Not on file   Highest education level: Not on file  Occupational History   Not on file  Tobacco Use   Smoking status: Former    Packs/day: 1.00    Years: 10.00    Additional pack years: 0.00    Total pack years: 10.00    Types: Cigars, Cigarettes    Quit date: 02/04/2021    Years since quitting: 1.8   Smokeless tobacco: Never  Substance and Sexual Activity   Alcohol use: Not Currently   Drug use: No   Sexual activity: Not on file  Other Topics Concern   Not on file  Social History Narrative   Not on file   Social Determinants of Health   Financial Resource Strain: Medium Risk (11/03/2022)   Overall Financial Resource Strain (CARDIA)    Difficulty of Paying Living Expenses: Somewhat hard  Food Insecurity: Food Insecurity Present (11/03/2022)   Hunger Vital Sign    Worried About Running Out of Food in the Last Year: Sometimes true    Ran Out of Food in the Last Year: Sometimes true  Transportation Needs: No Transportation Needs (11/03/2022)   PRAPARE - Administrator, Civil Service (Medical): No    Lack of Transportation (Non-Medical): No  Physical Activity: Inactive (11/03/2022)   Exercise Vital Sign    Days of Exercise per Week: 0 days    Minutes of Exercise per Session: 0 min  Stress: Stress Concern Present (11/03/2022)   [employer] of Occupational Health - Occupational Stress Questionnaire    Feeling of Stress : Rather much  Social Connections: Moderately  Isolated (11/03/2022)   Social Connection and Isolation Panel [NHANES]    Frequency of Communication with Friends and Family: Twice a week    Frequency of Social Gatherings with Friends and Family: Three times a week    Attends Religious Services: 1 to 4 times per year    Active Member of Clubs or Organizations: No    Attends Banker Meetings: Never    Marital Status: Separated  Intimate Partner Violence: Not At Risk (11/03/2022)   Humiliation, Afraid, Rape, and Kick questionnaire    Fear of Current or Ex-Partner: No    Emotionally Abused: No    Physically Abused: No    Sexually Abused: No    FAMILY HISTORY: Family History  Problem Relation Age of Onset   Cancer Sister    Diabetes Mother    Cancer Maternal Grandmother    Cancer  Paternal Grandmother     ALLERGIES:  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  MEDICATIONS:  Current Outpatient Medications  Medication Sig Dispense Refill   citalopram (CELEXA) 10 MG tablet Take 1 tablet (10 mg total) by mouth daily. 30 tablet 3   lidocaine-prilocaine (EMLA) cream Apply 1 Application topically as needed. Apply to port and cover with saran wrap 1-2 hours prior to port access 30 g 2   nicotine (NICODERM CQ - DOSED IN MG/24 HOURS) 14 mg/24hr patch Place 1 patch (14 mg total) onto the skin daily. 28 patch 2   amoxicillin-clavulanate (AUGMENTIN) 875-125 MG tablet Take 1 tablet by mouth 2 (two) times daily. (Patient not taking: Reported on 12/11/2022) 28 tablet 0   feeding supplement (ENSURE ENLIVE / ENSURE PLUS) LIQD Take 237 mLs by mouth daily. (Patient not taking: Reported on 10/27/2022) 237 mL 12   nicotine (NICODERM CQ - DOSED IN MG/24 HR) 7 mg/24hr patch Place 1 patch (7 mg total) onto the skin daily. BYRD fund to pay (Patient not taking: Reported on 09/22/2022) 28 patch 2   potassium chloride SA (KLOR-CON M) 20 MEQ tablet Take 1 tablet (20 mEq total) by mouth daily. BYRD fund to pay (Patient not taking: Reported on 09/22/2022) 3 tablet 0    traZODone (DESYREL) 50 MG tablet Take 1 tablet (50 mg total) by mouth at bedtime as needed for sleep. (Patient not taking: Reported on 12/26/2022) 30 tablet 3   No current facility-administered medications for this visit.     PHYSICAL EXAMINATION: ECOG PERFORMANCE STATUS: 1 - Symptomatic but completely ambulatory Vitals:   12/26/22 1026  BP: 138/78  Pulse: 90  Resp: 17  Temp: 98 F (36.7 C)  SpO2: 100%     There were no vitals filed for this visit.    Physical Exam HENT:     Head: Normocephalic and atraumatic.  Eyes:     General: No scleral icterus. Cardiovascular:     Rate and Rhythm: Normal rate and regular rhythm.     Heart sounds: Normal heart sounds.  Pulmonary:     Effort: Pulmonary effort is normal. No respiratory distress.     Breath sounds: No wheezing.     Comments: Decreased breath sound bilaterally.  Abdominal:     General: Bowel sounds are normal. There is no distension.     Palpations: Abdomen is soft.     Comments: Colostomy bag  Genitourinary:    Comments: Status post APR, Musculoskeletal:        General: No deformity. Normal range of motion.     Cervical back: Normal range of motion and neck supple.  Skin:    General: Skin is warm and dry.     Findings: No erythema or rash.  Neurological:     Mental Status: He is alert and oriented to person, place, and time. Mental status is at baseline.     Cranial Nerves: No cranial nerve deficit.     Coordination: Coordination normal.  Psychiatric:        Mood and Affect: Mood normal.     LABORATORY DATA:  I have reviewed the data as listed    Latest Ref Rng & Units 12/09/2022    7:41 AM 10/27/2022    8:40 AM 10/13/2022    2:39 PM  CBC  WBC 4.0 - 10.5 K/uL 4.9  5.1  13.8   Hemoglobin 13.0 - 17.0 g/dL 16.1  09.6  04.5   Hematocrit 39.0 - 52.0 % 43.8  39.3  35.1  Platelets 150 - 400 K/uL 206  361  298       Latest Ref Rng & Units 10/27/2022    8:40 AM 10/13/2022    2:39 PM 09/04/2022    9:31 AM   CMP  Glucose 70 - 99 mg/dL 409  94  811   BUN 6 - 20 mg/dL 11  10  11    Creatinine 0.61 - 1.24 mg/dL 9.14  7.82  9.56   Sodium 135 - 145 mmol/L 139  133  142   Potassium 3.5 - 5.1 mmol/L 3.8  3.8  4.1   Chloride 98 - 111 mmol/L 104  101  108   CO2 22 - 32 mmol/L 28  24  28    Calcium 8.9 - 10.3 mg/dL 8.8  8.2  9.0   Total Protein 6.5 - 8.1 g/dL 8.2  8.0  8.3   Total Bilirubin 0.3 - 1.2 mg/dL 0.4  0.6  0.3   Alkaline Phos 38 - 126 U/L 87  87  80   AST 15 - 41 U/L 20  20  19    ALT 0 - 44 U/L 16  14  19       RADIOGRAPHIC STUDIES: I have personally reviewed the radiological images as listed and agreed with the findings in the report. NM PET Image Restage (PS) Skull Base to Thigh (F-18 FDG)  Result Date: 12/18/2022 CLINICAL DATA:  Initial treatment strategy for rectal cancer. EXAM: NUCLEAR MEDICINE PET SKULL BASE TO THIGH TECHNIQUE: 8.9 mCi F-18 FDG was injected intravenously. Full-ring PET imaging was performed from the skull base to thigh after the radiotracer. CT data was obtained and used for attenuation correction and anatomic localization. Fasting blood glucose: 85 mg/dl COMPARISON:  MR pelvis 11/14/2022, CT abdomen pelvis 10/13/2022, CT chest 10/09/2022. FINDINGS: Mediastinal blood pool activity: SUV max 2.3 Liver activity: SUV max NA NECK: No hypermetabolic lymph nodes. Incidental CT findings: None. CHEST: No abnormal hypermetabolism. Incidental CT findings: Right IJ Port-A-Cath terminates near the SVC RA junction. Atherosclerotic calcification of the aorta. Heart size within normal limits. No pericardial or pleural effusion. Centrilobular emphysema. ABDOMEN/PELVIS: Extensive abnormal hypermetabolism associated with soft tissue thickening/mass in the presacral space, status post low anterior resection with SUV max up to 16.1 inferiorly. Given the amorphous nature of the soft tissue, measurement is challenging. Rough axial dimensions are 4.0 x 5.6 cm (4/143). No additional abnormal  hypermetabolism. Incidental CT findings: Probable cyst in the posterior right hepatic lobe. Liver, gallbladder and adrenal glands are otherwise unremarkable. Subcentimeter low-attenuation lesion in the right kidney, too small to characterize. No specific follow-up necessary. Visualized portions of the left kidney, spleen, pancreas and stomach are otherwise unremarkable. Left lower quadrant colostomy. SKELETON: No abnormal hypermetabolism. Incidental CT findings: Degenerative changes in the spine. IMPRESSION: 1. FDG avid soft tissue thickening/mass in the surgical bed, status post low anterior section, indicative of disease recurrence. No evidence of metastatic disease. 2.  Aortic atherosclerosis (ICD10-I70.0). 3.  Emphysema (ICD10-J43.9). Electronically Signed   By: Leanna Battles M.D.   On: 12/18/2022 13:31   CT ABDOMINAL MASS BIOPSY  Result Date: 12/09/2022 INDICATION: 57 year old male referred for biopsy of pelvic mass EXAM: CT BIOPSY MEDICATIONS: None. ANESTHESIA/SEDATION: Moderate (conscious) sedation was employed during this procedure. A total of Versed 2.0 mg and Fentanyl 100 mcg was administered intravenously. Moderate Sedation Time: 20 minutes. The patient's level of consciousness and vital signs were monitored continuously by radiology nursing throughout the procedure under my direct supervision. FLUOROSCOPY TIME:  CT COMPLICATIONS:  None PROCEDURE: Informed written consent was obtained from the patient after a thorough discussion of the procedural risks, benefits and alternatives. All questions were addressed. Maximal Sterile Barrier Technique was utilized including caps, mask, sterile gowns, sterile gloves, sterile drape, hand hygiene and skin antiseptic. A timeout was performed prior to the initiation of the procedure. The procedure risks, benefits, and alternatives were explained to the patient. Questions regarding the procedure were encouraged and answered. The patient understands and consents to  the procedure. Patient was position prone on the CT gantry table. Scout CT of the pelvis was performed for surgical planning purposes. The posterior pelvis was prepped with Chlorhexidine in a sterile fashion, and a sterile drape was applied covering the operative field. A sterile gown and sterile gloves were used for the procedure. Local anesthesia was provided with 1% Lidocaine. Using CT guidance, introducer needle was advanced into the soft tissue mass at the level of the coccyx. Once we confirmed needle tip position multiple 18 gauge core biopsies were performed with images placed into formalin solution. Needle was removed and a final CT was acquired. Manual pressure was used for hemostasis and a sterile dressing was placed. No complications were encountered no significant blood loss was encountered. Patient tolerated the procedure well and remained hemodynamically stable throughout. IMPRESSION: Status post CT-guided biopsy of pelvic mass. Signed, Yvone Neu. Miachel Roux, RPVI [facility] Electronically Signed   By: Gilmer Mor D.O.   On: 12/09/2022 10:10   MR PELVIS W WO CONTRAST  Result Date: 11/15/2022 CLINICAL DATA:  Presacral mass, history of rectal cancer status post resection with colostomy EXAM: MRI PELVIS WITHOUT AND WITH CONTRAST TECHNIQUE: Multiplanar multisequence MR imaging of the pelvis was performed both before and after administration of intravenous contrast. CONTRAST:  7mL GADAVIST GADOBUTROL 1 MMOL/ML IV SOLN COMPARISON:  CT abdomen pelvis, 10/13/2022, MR pelvis, 09/29/2018 FINDINGS: Urinary Tract: Unchanged thickening of the urinary bladder wall (series 8, image 22). Bowel: Status post abdominoperineal resection with left lower quadrant end colostomy. Compared to prior CT and MR, no significant change in appearance of heterogeneous, rim enhancing presacral and low pelvic soft tissue and fluid. Largest heterogeneously enhancing  conglomerate of fluid and soft tissue appears to closely involve the anteriorly abutting the seminal vesicles and measures 3.0 x 2.9 cm in largest axial dimension (series 11, image 22). This extends to superiorly contact the posterior bladder dome (series 11, image 15, series 5, image 17) and inferiorly towards the gluteal cleft. There is a fluid component inferiorly measuring 2.4 x 1.3 cm (series 11, image 29). Additional intrinsically T1 hyperintense, rim enhancing hemorrhagic or proteinaceous fluid collection in the right hemipelvis measuring 3.2 x 2.6 cm (series 11, image 19). Vascular/Lymphatic: No pathologically enlarged lymph nodes. No significant vascular abnormality seen. Reproductive:  No mass or other significant abnormality Other:  None. Musculoskeletal: No suspicious bone lesions identified. IMPRESSION: 1. Status post abdominoperineal resection with left lower quadrant end colostomy. 2. Compared to prior CT and MR, no significant change in appearance of heterogeneous, rim enhancing presacral and low pelvic soft tissue and fluid. Largest heterogeneously enhancing conglomerate of fluid and soft tissue appears to closely involve the anteriorly abutting the seminal vesicles and measures 3.0 x 2.9 cm in largest axial dimension. This extends to superiorly contact the posterior bladder dome and inferiorly towards the gluteal cleft. 3. Discrete fluid component at the most inferior extent measuring 2.4 x 1.3 cm. This is markedly diminished in volume compared to more  remote previous examinations, for example 06/26/2022. 4. Constellation of findings is highly concerning for locally recurrent rectal malignancy with or without superimposed infection. Presence or absence of infection at this time is not established by MR. 5. Additional unchanged hemorrhagic or proteinaceous fluid collection in the right hemipelvis measuring 3.2 x 2.6 cm most consistent with postoperative hematoma or seroma. 6. Unchanged thickening of  the urinary bladder wall. As previously reported, fistula or involvement of bladder wall by malignancy not excluded. Electronically Signed   By: Jearld Lesch M.D.   On: 11/15/2022 15:36   CT ABDOMEN PELVIS W CONTRAST  Result Date: 10/13/2022 CLINICAL DATA:  Abdominal pain, postop rectal pain. EXAM: CT ABDOMEN AND PELVIS WITH CONTRAST TECHNIQUE: Multidetector CT imaging of the abdomen and pelvis was performed using the standard protocol following bolus administration of intravenous contrast. RADIATION DOSE REDUCTION: This exam was performed according to the departmental dose-optimization program which includes automated exposure control, adjustment of the mA and/or kV according to patient size and/or use of iterative reconstruction technique. CONTRAST:  OMNIPAQUE IOHEXOL 300 MG/ML  SOLN COMPARISON:  CT abdomen dated 06/26/2022. FINDINGS: Lower chest: No acute abnormality. Hepatobiliary: No acute or suspicious findings within the liver. Gallbladder is unremarkable. No bile duct dilatation is seen. Pancreas: Unremarkable. No pancreatic ductal dilatation or surrounding inflammatory changes. Spleen: Normal in size without focal abnormality. Adrenals/Urinary Tract: Adrenal glands are unremarkable. Kidneys are unremarkable without suspicious mass, stone or hydronephrosis. Bladder walls are thick walled/edematous. Stomach/Bowel: No dilated large or small bowel loops. LEFT lower abdominal wall colostomy, without obstruction or inflammatory change. Vascular/Lymphatic: Aortic atherosclerosis. No acute-appearing vascular abnormality. Reproductive: History of prostatectomy. Other: Complex hypodense collection extending from the anal verge inferiorly into the presacral space and anteriorly from the presacral space to the bladder dome, not significantly changed in size or extent compared to the earlier CT of 06/26/2022. Stable rounded fluid collection along the posterior portion of the RIGHT pelvic sidewall, not  contiguous with the aforementioned fluid collection in the presacral space and therefore most likely an unrelated postsurgical seroma. Musculoskeletal: No acute-appearing osseous abnormality. No evidence of osteomyelitis. IMPRESSION: 1. Complex collection persists in the lower pelvis, extending from the anal verge upwards into the presacral space and anteriorly from the presacral space to the bladder dome, not significantly changed in size or extent compared to the earlier CT of 06/26/2022. This is most likely a combination of a postoperative seroma and/or chronic phlegmon/abscess versus recurrent abscess. 2. Bladder walls are thick walled/edematous. Given the contiguity of the presacral collection and the bladder dome, this is highly suspicious for a related bladder wall infection and possibly secondary to colovesical fistula. Recommend correlation with urinalysis. 3. No evidence of bowel obstruction. LEFT lower abdominal wall colostomy, without obstruction or inflammatory change. 4. No free intraperitoneal air. Aortic Atherosclerosis (ICD10-I70.0). Electronically Signed   By: Bary Richard M.D.   On: 10/13/2022 17:53   DG Chest Port 1 View  Result Date: 10/13/2022 CLINICAL DATA:  Sepsis EXAM: PORTABLE CHEST 1 VIEW COMPARISON:  06/26/2022, 10/09/2022 FINDINGS: Right IJ approach chest port remains in place. The heart size and mediastinal contours are within normal limits. Both lungs are clear. The visualized skeletal structures are unremarkable. IMPRESSION: No active disease. Electronically Signed   By: Duanne Guess D.O.   On: 10/13/2022 16:47   CT Chest W Contrast  Result Date: 10/09/2022 CLINICAL DATA:  Rectal cancer, chest staging follow-up lung nodule * Tracking Code: BO * EXAM: CT CHEST WITH CONTRAST TECHNIQUE: Multidetector CT  imaging of the chest was performed during intravenous contrast administration. RADIATION DOSE REDUCTION: This exam was performed according to the departmental dose-optimization  program which includes automated exposure control, adjustment of the mA and/or kV according to patient size and/or use of iterative reconstruction technique. CONTRAST:  75mL OMNIPAQUE IOHEXOL 300 MG/ML  SOLN COMPARISON:  CT chest abdomen pelvis, 06/02/2022 FINDINGS: Cardiovascular: Right chest port catheter. Scattered aortic atherosclerosis. Normal heart size. Left coronary artery calcifications. No pericardial effusion. Mediastinum/Nodes: No enlarged mediastinal, hilar, or axillary lymph nodes. Thymic remnant in the anterior mediastinum. Thyroid gland, trachea, and esophagus demonstrate no significant findings. Lungs/Pleura: Moderate centrilobular emphysema. Diffuse bilateral bronchial wall thickening. Unchanged 0.4 cm fissural nodule of the anterior left lower lobe (series 3, image 83). No pleural effusion or pneumothorax. Upper Abdomen: No acute abnormality. Musculoskeletal: No chest wall abnormality. No acute osseous findings. IMPRESSION: 1. Unchanged 0.4 cm fissural nodule of the anterior left lower lobe. This is almost certainly a benign fissural lymph node. No new or suspicious pulmonary nodules. 2. Moderate emphysema and diffuse bilateral bronchial wall thickening. 3. Coronary artery disease. Aortic Atherosclerosis (ICD10-I70.0) and Emphysema (ICD10-J43.9). Electronically Signed   By: Jearld Lesch M.D.   On: 10/09/2022 14:21   MR Pelvis Wo Contrast  Result Date: 10/01/2022 CLINICAL DATA:  Follow-up locally advanced rectal carcinoma. Status post abdominoperitoneal resection, chemotherapy, and radiation therapy. EXAM: MRI PELVIS WITHOUT CONTRAST TECHNIQUE: Multiplanar multisequence MR imaging of the pelvis was performed. No intravenous contrast was administered. Ultrasound gel was administered per rectum to optimize tumor evaluation. COMPARISON:  MRI on 03/07/2021, and CT on 06/26/22 FINDINGS: Lower Urinary Tract: Unremarkable urinary bladder. Bowel: Patient has undergone abdominoperitoneal resection  since prior MRI. A small fluid collection is seen in the surgical bed measuring 1.7 x 1.2 cm, significantly decreased from 7.3 x 4.0 cm on most recent CT. This is consistent with resolving postoperative fluid collection or abscess. A heterogeneous T2 hypointense presacral mass is seen. Comparison with prior preop MRI is difficult, but this does show considerable decrease in bulkiness since the original preop MRI. This currently measures 4.3 by 3.0 x 3.4 cm on images 14/8 and 30/9, and shows no significant change in size compared to recent CT on 06/26/2022. This mass does show diffuse restricted diffusion, and is relatively bulky in appearance. These features favor recurrent carcinoma over posttreatment changes J Vascular/Lymphatic: Enlarged pelvic sidewall lymph node measures 3.2 x 2.5 cm on image 9/8, without significant change since previous study. No new or increased lymphadenopathy identified. Reproductive:  Unremarkable prostate. Other: None. Musculoskeletal: No suspicious bone lesions identified. IMPRESSION: Interval abdominoperitoneal resection since prior MRI. Decreased small fluid collection in the surgical bed compared with more recent CT, consistent with resolving postoperative fluid collection or abscess. Bulky rounded presacral mass, which shows diffuse restricted diffusion, without significant change in size since most recent CT of 06/26/2022. This raises suspicion for recurrent carcinoma over post treatment changes. Recommend correlation with CEA level, and consider tissue sampling or PET-CT. Stable enlarged right pelvic sidewall lymph node. No new or increased adenopathy identified. Electronically Signed   By: Danae Orleans M.D.   On: 10/01/2022 10:48

## 2022-12-26 NOTE — Assessment & Plan Note (Addendum)
locally advanced rectal cancer-Stage IIIB, s/p TNT neoadjuvant protocol, concurrent Xeloda and radiation-Finished 08/27/2021, s/p  APR,rectal adenocarcinoma, ypT3N0. Positive radial margin due to perforation. Biopsy proven recurrent rectal cancer, locally advanced disease, possible colovesical fistula, PET scan did not showed distant metastatic disease.  I have discussed with his [name] Surgeon Dr. Luciano Cutter, recurrence is not resectable.  Recommend combined modality therapy with concurrent Xeloda 825 mg/m2 twice daily  with radiation,  Currently he has no UTI symptoms. For possible colovesical fistula, I will refer him to establish care with urology

## 2022-12-29 ENCOUNTER — Other Ambulatory Visit: Payer: Self-pay

## 2022-12-29 ENCOUNTER — Telehealth: Payer: Self-pay | Admitting: Pharmacist

## 2022-12-29 ENCOUNTER — Telehealth: Payer: Self-pay

## 2022-12-29 ENCOUNTER — Other Ambulatory Visit (HOSPITAL_COMMUNITY): Payer: Self-pay

## 2022-12-29 ENCOUNTER — Encounter: Payer: Self-pay | Admitting: Oncology

## 2022-12-29 DIAGNOSIS — R300 Dysuria: Secondary | ICD-10-CM

## 2022-12-29 DIAGNOSIS — C20 Malignant neoplasm of rectum: Secondary | ICD-10-CM

## 2022-12-29 MED ORDER — CAPECITABINE 500 MG PO TABS
825.0000 mg/m2 | ORAL_TABLET | Freq: Two times a day (BID) | ORAL | 0 refills | Status: DC
Start: 2022-12-29 — End: 2023-02-06

## 2022-12-29 NOTE — Telephone Encounter (Signed)
Can you please add lab encounter (UA & Urine culture) tomorrow prior to going to radiation.

## 2022-12-29 NOTE — Telephone Encounter (Signed)
Oral Oncology Pharmacist Encounter  Received new prescription for capecitabine for the treatment of rectal cancer in conjunction with radiation, planned duration of 5 weeks.  Labs from 12/09/22 assessed, appropriate for initiation. No interventions needed. Prescription dose and frequency assessed for appropriateness.  Current medication list in Epic reviewed, no DDIs identified.  Evaluated chart and no patient barriers to medication adherence noted.   Patient agreement for treatment documented in MD note on 12/26/22. Planned initiation 01/08/23.   Prescription has been e-scribed to [facility] for benefits analysis and approval.  Oral Oncology Clinic will continue to follow for insurance authorization, copayment issues, initial counseling and start date.  Lennie Muckle, PharmD Candidate 12/29/2022 11:33 AM Oral Oncology Clinic [phone]

## 2022-12-29 NOTE — Telephone Encounter (Signed)
Oral Oncology Patient Advocate Encounter  New authorization   Received notification that prior authorization for Capecitabine is required.   PA submitted on 12/29/22  Key [id number]  Status is pending     Ardeen Fillers, CPhT Oncology Pharmacy Patient Advocate  [facility]  [phone] (phone) [phone] (fax) 12/29/2022 9:23 AM

## 2022-12-29 NOTE — Telephone Encounter (Signed)
Oral Oncology Patient Advocate Encounter  Prior Authorization for Capecitabine has been approved.    PA# [id number]  Effective dates: 12/29/22 through 12/29/23  Patient must fill through [facility].   Called [facility] and verified patient's co-pay is $0.00.   Ardeen Fillers, CPhT Oncology Pharmacy Patient Advocate  [facility]  [phone] (phone) [phone] (fax) 12/29/2022 9:25 AM

## 2022-12-29 NOTE — Telephone Encounter (Signed)
Oral Chemotherapy Pharmacist Encounter   I spoke with patient for overview of: Xeloda for the treatment of rectal cancer in conjunction with radiation, planned duration 5 weeks.   Counseled patient on administration, dosing, side effects, monitoring, drug-food interactions, safe handling, storage, and disposal.  Patient will take Xeloda  tablets, 3 tablets (1500 mg) by mouth in AM and 3 tabs (1500 mg) by mouth in PM, within 30 minutes of finishing meals, on days of radiation only.  Xeloda and radiation start date: 01/08/23  Adverse effects of Xeloda include but are not limited to: fatigue, decreased blood counts, GI upset, diarrhea, mouth sores, and hand-foot syndrome.  Patient does not have anti-emetic on hand but knows to reach out to clinic if nausea develops.   Patient will obtain anti diarrheal and alert the office of 4 or more loose stools above baseline.   Reviewed with patient importance of keeping a medication schedule and plan for any missed doses. No barriers to medication adherence identified.  Insurance authorization for Xeloda has been obtained. This will ship from the [facility].  Patient informed the pharmacy will reach out 5-7 days prior to needing next fill of Xeloda to coordinate continued medication acquisition to prevent break in therapy.  All questions answered.  Patient voiced understanding and appreciation.    Medication education handout placed in mail for patient. Patient knows to call the office with questions or concerns. Oral Chemotherapy Clinic phone number provided to patient.   Lennie Muckle, PharmD Candidate 12/29/2022  12:52 PM Oral Oncology Clinic [phone]

## 2022-12-30 ENCOUNTER — Ambulatory Visit
Admission: RE | Admit: 2022-12-30 | Discharge: 2022-12-30 | Disposition: A | Discharge status: Home / Self Care | Payer: 59 | Source: Ambulatory Visit | Attending: Radiation Oncology | Admitting: Radiation Oncology

## 2022-12-30 ENCOUNTER — Inpatient Hospital Stay: Payer: 59

## 2022-12-30 ENCOUNTER — Telehealth: Payer: Self-pay

## 2022-12-30 DIAGNOSIS — C20 Malignant neoplasm of rectum: Secondary | ICD-10-CM

## 2022-12-30 DIAGNOSIS — Z9221 Personal history of antineoplastic chemotherapy: Secondary | ICD-10-CM | POA: Diagnosis not present

## 2022-12-30 DIAGNOSIS — R911 Solitary pulmonary nodule: Secondary | ICD-10-CM | POA: Diagnosis not present

## 2022-12-30 DIAGNOSIS — Z933 Colostomy status: Secondary | ICD-10-CM | POA: Diagnosis not present

## 2022-12-30 DIAGNOSIS — R300 Dysuria: Secondary | ICD-10-CM

## 2022-12-30 LAB — URINALYSIS, COMPLETE (UACMP) WITH MICROSCOPIC
Bacteria, UA: NONE SEEN
Bilirubin Urine: NEGATIVE
Glucose, UA: NEGATIVE mg/dL
Hgb urine dipstick: NEGATIVE
Ketones, ur: NEGATIVE mg/dL
Leukocytes,Ua: NEGATIVE
Nitrite: NEGATIVE
Protein, ur: NEGATIVE mg/dL
Specific Gravity, Urine: 1.012 (ref 1.005–1.030)
WBC, UA: NONE SEEN WBC/hpf (ref 0–5)
pH: 6 (ref 5.0–8.0)

## 2022-12-30 NOTE — Telephone Encounter (Signed)
-----   Message from [name], MD sent at 12/26/2022  8:44 PM EDT ----- I plan to start him on Xeloda  BID [825mg /m2] concurrently with his RT.  [facility] and [place], please arrange medication to be delivered to him. He should not start until his visit with me.  Lanora Manis and Ivin Booty, please arrange him to see me lab MD cbc cmp CEA on D1 of Radiation. Thank you  Janyth Contes

## 2022-12-30 NOTE — Telephone Encounter (Signed)
Pt Scheduled to start radiation on 5/1.

## 2022-12-31 ENCOUNTER — Encounter: Payer: Self-pay | Admitting: Oncology

## 2022-12-31 ENCOUNTER — Other Ambulatory Visit (HOSPITAL_COMMUNITY): Payer: Self-pay

## 2022-12-31 LAB — URINE CULTURE: Culture: NO GROWTH

## 2022-12-31 NOTE — Telephone Encounter (Signed)
Spoke with Mr. Runk, provided him with the phone number to [facility] ([phone]). He states he would call them today.

## 2023-01-02 ENCOUNTER — Encounter: Payer: Self-pay | Admitting: Oncology

## 2023-01-05 DIAGNOSIS — R911 Solitary pulmonary nodule: Secondary | ICD-10-CM | POA: Diagnosis not present

## 2023-01-05 DIAGNOSIS — Z933 Colostomy status: Secondary | ICD-10-CM | POA: Diagnosis not present

## 2023-01-05 DIAGNOSIS — Z9221 Personal history of antineoplastic chemotherapy: Secondary | ICD-10-CM | POA: Diagnosis not present

## 2023-01-05 DIAGNOSIS — C20 Malignant neoplasm of rectum: Secondary | ICD-10-CM | POA: Diagnosis not present

## 2023-01-07 ENCOUNTER — Other Ambulatory Visit: Payer: Self-pay

## 2023-01-07 ENCOUNTER — Inpatient Hospital Stay: Payer: 59

## 2023-01-07 ENCOUNTER — Encounter: Payer: Self-pay | Admitting: Oncology

## 2023-01-07 ENCOUNTER — Ambulatory Visit
Admission: RE | Admit: 2023-01-07 | Discharge: 2023-01-07 | Disposition: A | Discharge status: Home / Self Care | Payer: 59 | Source: Ambulatory Visit | Attending: Radiation Oncology | Admitting: Radiation Oncology

## 2023-01-07 ENCOUNTER — Inpatient Hospital Stay (HOSPITAL_BASED_OUTPATIENT_CLINIC_OR_DEPARTMENT_OTHER): Payer: 59 | Admitting: Oncology

## 2023-01-07 VITALS — BP 132/100 | HR 87 | Temp 96.2°F | Resp 18 | Wt 163.0 lb

## 2023-01-07 DIAGNOSIS — Z85048 Personal history of other malignant neoplasm of rectum, rectosigmoid junction, and anus: Secondary | ICD-10-CM | POA: Insufficient documentation

## 2023-01-07 DIAGNOSIS — Z452 Encounter for adjustment and management of vascular access device: Secondary | ICD-10-CM | POA: Insufficient documentation

## 2023-01-07 DIAGNOSIS — Z9221 Personal history of antineoplastic chemotherapy: Secondary | ICD-10-CM | POA: Diagnosis not present

## 2023-01-07 DIAGNOSIS — Z95828 Presence of other vascular implants and grafts: Secondary | ICD-10-CM

## 2023-01-07 DIAGNOSIS — E876 Hypokalemia: Secondary | ICD-10-CM

## 2023-01-07 DIAGNOSIS — R300 Dysuria: Secondary | ICD-10-CM | POA: Diagnosis not present

## 2023-01-07 DIAGNOSIS — Z87891 Personal history of nicotine dependence: Secondary | ICD-10-CM

## 2023-01-07 DIAGNOSIS — Z933 Colostomy status: Secondary | ICD-10-CM | POA: Diagnosis not present

## 2023-01-07 DIAGNOSIS — C20 Malignant neoplasm of rectum: Secondary | ICD-10-CM | POA: Insufficient documentation

## 2023-01-07 DIAGNOSIS — Z433 Encounter for attention to colostomy: Secondary | ICD-10-CM | POA: Diagnosis not present

## 2023-01-07 DIAGNOSIS — R911 Solitary pulmonary nodule: Secondary | ICD-10-CM | POA: Diagnosis not present

## 2023-01-07 LAB — CMP (CANCER CENTER ONLY)
ALT: 22 U/L (ref 0–44)
AST: 34 U/L (ref 15–41)
Albumin: 3.5 g/dL (ref 3.5–5.0)
Alkaline Phosphatase: 96 U/L (ref 38–126)
Anion gap: 9 (ref 5–15)
BUN: 15 mg/dL (ref 6–20)
CO2: 22 mmol/L (ref 22–32)
Calcium: 8.6 mg/dL — ABNORMAL LOW (ref 8.9–10.3)
Chloride: 105 mmol/L (ref 98–111)
Creatinine: 1.1 mg/dL (ref 0.61–1.24)
GFR, Estimated: >60 mL/min (ref >60)
Glucose, Bld: 170 mg/dL — ABNORMAL HIGH (ref 70–99)
Potassium: 3.5 mmol/L (ref 3.5–5.1)
Sodium: 136 mmol/L (ref 135–145)
Total Bilirubin: 0.3 mg/dL (ref 0.3–1.2)
Total Protein: 7.3 g/dL (ref 6.5–8.1)

## 2023-01-07 LAB — CBC WITH DIFFERENTIAL (CANCER CENTER ONLY)
Abs Immature Granulocytes: 0.01 10*3/uL (ref 0.00–0.07)
Basophils Absolute: 0 10*3/uL (ref 0.0–0.1)
Basophils Relative: 1 %
Eosinophils Absolute: 0.2 10*3/uL (ref 0.0–0.5)
Eosinophils Relative: 3 %
HCT: 41.6 % (ref 39.0–52.0)
Hemoglobin: 14.5 g/dL (ref 13.0–17.0)
Immature Granulocytes: 0 %
Lymphocytes Relative: 31 %
Lymphs Abs: 1.7 10*3/uL (ref 0.7–4.0)
MCH: 30.3 pg (ref 26.0–34.0)
MCHC: 34.9 g/dL (ref 30.0–36.0)
MCV: 87 fL (ref 80.0–100.0)
Monocytes Absolute: 0.7 10*3/uL (ref 0.1–1.0)
Monocytes Relative: 12 %
Neutro Abs: 2.8 10*3/uL (ref 1.7–7.7)
Neutrophils Relative %: 53 %
Platelet Count: 183 10*3/uL (ref 150–400)
RBC: 4.78 MIL/uL (ref 4.22–5.81)
RDW: 15.9 % — ABNORMAL HIGH (ref 11.5–15.5)
Smear Review: NORMAL
WBC Count: 5.3 10*3/uL (ref 4.0–10.5)
nRBC: 0 % (ref 0.0–0.2)

## 2023-01-07 MED ORDER — LOPERAMIDE HCL 2 MG PO CAPS
2.0000 mg | ORAL_CAPSULE | ORAL | 2 refills | Status: DC
  Filled 2023-01-07: qty 90, 11d supply, fill #0

## 2023-01-07 MED ORDER — ONDANSETRON HCL 8 MG PO TABS
8.0000 mg | ORAL_TABLET | Freq: Three times a day (TID) | ORAL | 1 refills | Status: DC | PRN
  Filled 2023-01-07: qty 18, 6d supply, fill #0

## 2023-01-07 MED ORDER — NICOTINE 7 MG/24HR TD PT24
7.0000 mg | MEDICATED_PATCH | Freq: Every day | TRANSDERMAL | 2 refills | Status: DC
  Filled 2023-01-07 (×2): qty 14, 14d supply, fill #0

## 2023-01-07 MED ORDER — PROCHLORPERAZINE MALEATE 10 MG PO TABS
10.0000 mg | ORAL_TABLET | Freq: Four times a day (QID) | ORAL | 1 refills | Status: AC | PRN
  Filled 2023-01-07: qty 90, 23d supply, fill #0

## 2023-01-07 NOTE — Assessment & Plan Note (Addendum)
Continue potassium supplementation 

## 2023-01-07 NOTE — Assessment & Plan Note (Addendum)
locally advanced rectal cancer-Stage IIIB, s/p TNT neoadjuvant protocol, concurrent Xeloda and radiation-Finished 08/27/2021, s/p  APR,rectal adenocarcinoma, ypT3N0. Positive radial margin due to perforation. Biopsy proven recurrent rectal cancer, locally advanced disease, possible colovesical fistula; no distant metastatic disease on PET  I have discussed with his [name] Surgeon Dr. Luciano Cutter, recurrence is not resectable.  Recommend combined modality therapy with concurrent Xeloda 825 mg/m2 twice daily  with radiation Labs are reviewed and recommend patient to start Xeloda today Follow up with [facility] for RT.   Imodum and antiemetics prescription sent to pharmacy

## 2023-01-07 NOTE — Progress Notes (Signed)
Pt here for follow up. Reports that he has been having nausea and needs an antiemetic.

## 2023-01-07 NOTE — Assessment & Plan Note (Signed)
He needs colostomy care supplies. Prefers to establish with local wound care. Will refer.

## 2023-01-07 NOTE — Assessment & Plan Note (Signed)
Referred to Urology. Discussed case with Dr. Apolinar Junes.

## 2023-01-07 NOTE — Progress Notes (Signed)
Hematology/Oncology Progress note [phone] [phone]      CHIEF COMPLAINTS/REASON FOR VISIT:  Follow up for recurrent rectal cancer treatment  ASSESSMENT & PLAN:   Cancer Staging  Rectal cancer [facility]) Staging form: Colon and Rectum, AJCC 8th Edition - Clinical stage from 02/08/2021: Stage IIIB (cT4a, cN1, cM0) - Signed by [name], MD on 03/06/2021   Rectal cancer [facility]) locally advanced rectal cancer-Stage IIIB, s/p TNT neoadjuvant protocol, concurrent Xeloda and radiation-Finished 08/27/2021, s/p  APR,rectal adenocarcinoma, ypT3N0. Positive radial margin due to perforation. Biopsy proven recurrent rectal cancer, locally advanced disease, possible colovesical fistula; no distant metastatic disease on PET  I have discussed with his [name] Surgeon Dr. Luciano Cutter, recurrence is not resectable.  Recommend combined modality therapy with concurrent Xeloda 825 mg/m2 twice daily  with radiation Labs are reviewed and recommend patient to start Xeloda today Follow up with Radonc for RT.   Imodum and antiemetics prescription sent to pharmacy  Dysuria Referred to Urology. Discussed case with Dr. Apolinar Junes.   Port-A-Cath in place Port flush every 6-8 weeks.    Hypokalemia Continue potassium supplementation   Former smoker Refilled  Nicotin patch to 7mg /24 hours  Colostomy care [facility]) He needs colostomy care supplies. Prefers to establish with local wound care. Will refer.     Orders Placed This Encounter  Procedures   CBC with Differential (Cancer Center Only)    Standing Status:   Future    Standing Expiration Date:   01/07/2024   CMP (Cancer Center only)    Standing Status:   Future    Standing Expiration Date:   01/07/2024   Amb Referral to [facility]    Referral Priority:   Routine    Referral Type:   Consultation    Referral Reason:   Specialty Services Required    Number of Visits Requested:   1    Follow-up 1 week  All questions were answered. The  patient knows to call the clinic with any problems, questions or concerns.  [name], MD, PhD [facility] Hematology Oncology 01/07/2023        HISTORY OF PRESENTING ILLNESS:   Barry Happ. is a  57 y.o.  male presents for rectal cancer Oncology History  Rectal cancer (HCC)  02/08/2021 Initial Diagnosis   Rectal cancer   02/05/2021-02/06/2021 patient was hospitalized due to generalized weakness, intermittent lightheadedness, weight loss and worsening constipation.  02/05/2021 CT abdomen showed concerning of severe rectal wall thickening and right internal iliac lymph node concerning for metastatic disease.  Patient was seen by gastroenterology and had colonoscopy which showed a circumferential fungating mass in the rectum.  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   02/14/2021-02/16/2021 hospitalized due to rectal bleeding and pain.   02/14/2021 CT showed perirectal fluid collection/gas concerning for infection with significant leukocytosis, anemia with hemoglobin of 6.8.  Patient received PRBC transfusion, IV antibiotics.  He underwent an IR guided placement of JP drain into the rectal abscess.  Discharged home with oral Augmentin. 02/20/2021 presented to ER with new skin opening and draining from left gluteus.  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  JP drain has been removed.  Patient was continued on Augmentin.   02/13/2021, PET scan showed locally advanced rectal cancer with billowing of the mesorectum now with low attenuation material that was not present on previous examination with extensive stranding and inflammation.   Bulky RIGHT pelvic sidewall/hypogastric lymph node outside of the mesorectum with stippled calcification measuring 19 mm-no  increased metabolic activity. Small LEFT hypogastric lymph node just peripheral to the internal external bifurcation-SUV 3.2 High RIGHT internal just below or at the internal/common iliac transition, lymph node -SUV  4.8 LEFT high hypogastric lymph node  8 mm-SUV 3. Scattered lymph nodes throughout the retroperitoneum with low FDG uptake Bilateral inguinal lymph nodes largest on the RIGHT (image 248/3) 11 mm with a maximum SUV of 2.8 spiculated nodule in the LEFT upper lobe- 9 x 8 mm       02/14/2021, CT abdomen pelvis without contrast Showed perforated rectal mass with contained perforation with fluid and gas extending above and below the pelvic floor,potentially involving the sphincter complex and extending into LEFT ischial rectal fossa   02/20/2021, CT pelvis with contrast showed Large perirectal/perianal abscess has markedly decreased in size since placement of the percutaneous drain. There are residual gas-filled collections in the soft tissues and suspect there is a fistula or sinus tract between the rectal mass and the subcutaneous tissues. Soft tissue gas along the medial left buttock and concern for a cutaneous ulceration in this area. Large rectal mass with evidence for a large necrotic right pelvic lymph node.   His case is complicated with a perirectal abscess. Prior to onset of perirectal abscess, on his 02/05/2021 scan, he was noted to have right internal iliac lymph node 3 x 1 x 2.3 cm which is concerning for nodal disease.On Subsequent images it was difficult to distinguish whether lymphadenopathy was due to nodal disease versus acute inflammation. 03/07/2021 MRI pelvis cT3 N2. Due to the possible contained perforation on previous CT, possible cT4 disease.    02/27/2021 medi port placed by Dr.Dew 03/13/2021 reports right butt cheek and also left perianal area fullness.he was seen by Dr.Pabon urgently  and had right buttock abscess drained. Left perianal fullness was felt to be due to cancer.    02/08/2021 Cancer Staging   Staging form: Colon and Rectum, AJCC 8th Edition - Clinical stage from 02/08/2021: Stage IIIB (cT4a, cN1, cM0) - Signed by [name], MD on 03/06/2021 Stage prefix: Initial diagnosis    02/27/2021 Procedure   medi port placed by Dr.Dew   03/18/2021 - 07/05/2021 Chemotherapy    FOLFOX q14d x 4 months      07/17/2021 - 08/27/2021 Chemotherapy   Xeloda concurrent with Radiation   11/18/2021 Surgery   patient is status post open APR with flap for rectal cancer. Pathology rectal adenomacarcinoma ypT3N0.  Positive margin: Radial (circumferential) or mesenteric: adjacent to perforation      01/27/2022 - 02/02/2022 Hospital Admission   Hospitalized at [facility] due to recurrent abscess.  US guided drain into the pelvic abscess with return of 60 ml of purulent fluid. Cultures positive for staph aureus and strep agalactiae group b, fungal cultures negative.    02/25/2022 Imaging   CT chest angiogram with and without contrast, CT abdomen pelvis with and without contrast 1. Negative for acute pulmonary embolus.2. Emphysema. Further decrease in size of the previously noted irregular nodule in the left upper lobe which is now barely measurable today. No new suspicious lung nodules 3. Status post left lower quadrant colostomy. Further decrease in size since comparison exam from May of the rim enhancing gas and fluid collection at the pelvic surgical bed extending from the perineum superiorly into the pelvis 4. Slightly thickened appearance of terminal ileal small bowel loops in the pelvis with mild stranding suggesting small bowel inflammatory process. 5. Stable enlarged right pelvic sidewall lymph node   06/02/2022 Imaging  CT chest abdomen pelvis w contrast 1. Status post abdominal perineal resection with descending colostomy. 2. At the site of pelvic fluid and gas collection on 02/25/2022, there is residual, decreased presacral soft tissue fullness, without drainable collection. 3. Similar right obturator nodal metastasis.4.  No acute process or evidence of metastatic disease in the chest. 5. Age advanced coronary artery atherosclerosis. Recommend assessment of coronary risk factors. 6.  Aortic atherosclerosis and emphysema     06/26/2022 Imaging   CT abdomen pelvis with contrast showed 1. Interval increase in size of a lobulated partially imaged at least 7.8 x 3.8 x 12 cm abscess in the perianal region in a patient status post abdominal perineal resection and left lower lobe end colostomy formation. Finding extends to involve the pre sacral region up to the urinary dome level. Associated posterior urinary bladder wall thickening with lack of intraperitoneal fat plane between the presacral soft tissue thickening/abscess formation suggestive of possible fistulization and invasion of the posterior bladder wall. Underlying recurrent malignancy is not excluded. 2. Stable 2.7 cm right pelvic sidewall lymph node. 3.  Aortic Atherosclerosis   06/26/2022 - 06/27/2022 Hospital Admission   He went to [facility] ER and CT showed recurrent abscess. He was transferred to [facility] due to recurrent abscess, treated with IV vanc/zosyn, IR was consulted and drainage tube was placed. Wound grew up Strep viridans. Patient was seen by ID at [facility] discharged with Augmentin for 2 weeks.   He was seen by wound care Dr> Kandace Blitz on 07/09/2022, JP drain was removed.    09/29/2022 Imaging   MRI pelvis without contrast showed  Interval abdominoperitoneal resection since prior MRI. Decreased small fluid collection in the surgical bed compared with more recent CT, consistent with resolving postoperative fluid collection or abscess.   Bulky rounded presacral mass, which shows diffuse restricted diffusion, without significant change in size since most recent CT of 06/26/2022. This raises suspicion for recurrent carcinoma over post treatment changes. Recommend correlation with CEA level, and consider tissue sampling or PET-CT.   Stable enlarged right pelvic sidewall lymph node. No new or increased adenopathy identified.   10/09/2022 Imaging   CT chest with contrast showed 1. Unchanged 0.4 cm fissural nodule of the  anterior left lower lobe. This is almost certainly a benign fissural lymph node. No new or suspicious pulmonary nodules. 2. Moderate emphysema and diffuse bilateral bronchial wall thickening. 3. Coronary artery disease.   10/13/2022 Imaging   CT abdomen pelvis with contrast showed 1. Complex collection persists in the lower pelvis, extending from the anal verge upwards into the presacral space and anteriorly from the presacral space to the bladder dome, not significantly changed in size or extent compared to the earlier CT of 06/26/2022. This is most likely a combination of a postoperative seroma and/or chronic phlegmon/abscess versus recurrent abscess. 2. Bladder walls are thick walled/edematous. Given the contiguity of the presacral collection and the bladder dome, this is highly suspicious for a related bladder wall infection and possibly secondary to colovesical fistula. Recommend correlation with urinalysis. 3. No evidence of bowel obstruction. LEFT lower abdominal wall colostomy, without obstruction or inflammatory change. 4. No free intraperitoneal air   10/13/2022 [facility] Admission   Hospitalized due to fever and rectal pain. Urine culture showed mixed urogenital flora. IR CT guided Aspiration of abscess showed Streptococcal Viridans. He was placed back on Augmentin  10/14/2023 CT read by [facility] radiology Postsurgical changes of abdominoperineal resection. Interval enlargement of  fluid collection in the surgical bed.  Multiple foci of enhancing tissue at  the margins of this fluid collection, increased from prior study, suspicious for local recurrence.   Prominent retroperitoneal lymph nodes, some which are increased in size from prior study. These are indeterminate and may be either reactive or represent metastatic disease. Recommend continued attention on follow-up.   Centrally hypoattenuating right obturator lymph node, unchanged in size, consistent with treated disease.   Circumferential  bladder wall thickening and irregularity, most pronounced on the left side. This may be reactive in the setting of adjacent pelvic inflammatory changes. Correlate with urinalysis if there is clinical  concern for urinary tract infection.    11/14/2022 Imaging   MRI pelvis w wo contrast  1. Status post abdominoperineal resection with left lower quadrant end colostomy. 2. Compared to prior CT and MR, no significant change in appearance of heterogeneous, rim enhancing presacral and low pelvic soft tissue and fluid. Largest heterogeneously enhancing conglomerate of fluid and soft tissue appears to closely involve the anteriorly abutting the seminal vesicles and measures 3.0 x 2.9 cm in largest axial dimension. This extends to superiorly contact the posterior bladder dome and inferiorly towards the gluteal cleft. 3. Discrete fluid component at the most inferior extent measuring 2.4 x 1.3 cm. This is markedly diminished in volume compared to more remote previous examinations, for example 06/26/2022. 4. Constellation of findings is highly concerning for locally recurrent rectal malignancy with or without superimposed infection. Presence or absence of infection at this time is not established by MR. 5. Additional unchanged hemorrhagic or proteinaceous fluid collection in the right hemipelvis measuring 3.2 x 2.6 cm most consistent with postoperative hematoma or seroma. 6. Unchanged thickening of the urinary bladder wall. As previously reported, fistula or involvement of bladder wall by malignancy not excluded.   12/09/2022 Procedure   CT guided biopsy of the pelvic mass showed Moderately differentiated adenocarcinoma with dirty necrosis, morphologically identical to patient's prior rectal adenocarcinoma.   [facility] NGS xT 648 panel showed MLH3 start loss,  BCORL1 frame shift, TP53 splice region varian, APC stop gain,  TMB 7.9, MS stable, KRAS/BRAF/NRAS negative.   [facility] NGS xR showed no gene arrangement or  reportable altered splicing events in RNA sequencing        Patient presented to emergency room on 12/08/21, rectal pain.  CT scan showed 18.3 cm fluid and gas collection in the pelvic surgical bed compatible with infected collection/abscess.  Patient was sent to [facility] and admitted for.  Patient has CT-guided drain placement on 12/09/2021.  Blood culture positive for Staph epidermidis which was felt to be contaminant.  Fluid culture grew pansensitive Staph aureus, strep pyogenes and candida albicans.  Patient was treated with IV antibiotics and transition to p.o. Augmentin and fluconazole on 12/11/2021.  #Perirectal abscess status post drainage catheter , patient was recently seen by [facility] oncology surgeon on 12/24/2021 and was recommended to keep drainage catheter and continue antibiotics. Patient had a repeat CT scan done which showed a smaller but persistent abscess.  He will return to [facility] surgery for follow-up of drain removal. His case was discussed on [name] tumor board for the positive mesenteric margin which was felt to be secondary to previous perforation.  Recommend surveillance.  INTERVAL HISTORY Barry Wiechman. is a 57 y.o. male who has above history reviewed by me today presents for follow up visit for management of recurrent locally advanced rectal cancer. +dysuria, Denies fever chills, rectal pain. Loose mushy stool in colostomy bag, getting better.  Review of Systems  Constitutional:  Negative for appetite change, chills, fatigue, fever and unexpected weight change.  HENT:   Negative for hearing loss and voice change.   Eyes:  Negative for eye problems and icterus.  Respiratory:  Negative for chest tightness, cough and shortness of breath.   Cardiovascular:  Negative for chest pain and leg swelling.  Gastrointestinal:  Negative for abdominal distention, abdominal pain, blood in stool, constipation, diarrhea and rectal pain.       Very occasional rectal drainage  Endocrine:  Negative for hot flashes.  Genitourinary:  Negative for difficulty urinating, dysuria and frequency.   Musculoskeletal:  Negative for arthralgias.  Skin:  Negative for itching and rash.  Neurological:  Negative for light-headedness and numbness.  Hematological:  Negative for adenopathy. Does not bruise/bleed easily.  Psychiatric/Behavioral:  Negative for confusion.     MEDICAL HISTORY:  Past Medical History:  Diagnosis Date   Headache    Left shoulder pain    Rectal cancer (HCC)     SURGICAL HISTORY: Past Surgical History:  Procedure Laterality Date   COLON SURGERY     COLONOSCOPY WITH PROPOFOL N/A 02/06/2021   Procedure: COLONOSCOPY WITH PROPOFOL;  Surgeon: Toney Reil, MD;  Location: [facility] ENDOSCOPY;  Service: Gastroenterology;  Laterality: N/A;   PORTA CATH INSERTION N/A 02/27/2021   Procedure: PORTA CATH INSERTION;  Surgeon: Annice Needy, MD;  Location: [facility] INVASIVE CV LAB;  Service: Cardiovascular;  Laterality: N/A;   ROTATOR CUFF REPAIR Left     SOCIAL HISTORY: Social History   Socioeconomic History   Marital status: Married    Spouse name: Not on file   Number of children: Not on file   Years of education: Not on file   Highest education level: Not on file  Occupational History   Not on file  Tobacco Use   Smoking status: Former    Packs/day: 1.00    Years: 10.00    Additional pack years: 0.00    Total pack years: 10.00    Types: Cigars, Cigarettes    Quit date: 02/04/2021    Years since quitting: 1.9   Smokeless tobacco: Never  Substance and Sexual Activity   Alcohol use: Not Currently   Drug use: No   Sexual activity: Not on file  Other Topics Concern   Not on file  Social History Narrative   Not on file   Social Determinants of Health   Financial Resource Strain: Medium Risk (11/03/2022)   Overall Financial Resource Strain (CARDIA)    Difficulty of Paying Living Expenses: Somewhat hard  Food Insecurity: Food Insecurity Present (11/03/2022)    Hunger Vital Sign    Worried About Running Out of Food in the Last Year: Sometimes true    Ran Out of Food in the Last Year: Sometimes true  Transportation Needs: No Transportation Needs (11/03/2022)   PRAPARE - Administrator, Civil Service (Medical): No    Lack of Transportation (Non-Medical): No  Physical Activity: Inactive (11/03/2022)   Exercise Vital Sign    Days of Exercise per Week: 0 days    Minutes of Exercise per Session: 0 min  Stress: Stress Concern Present (11/03/2022)   [employer] of Occupational Health - Occupational Stress Questionnaire    Feeling of Stress : Rather much  Social Connections: Moderately Isolated (11/03/2022)   Social Connection and Isolation Panel [NHANES]    Frequency of Communication with Friends and Family: Twice a week    Frequency of Social  Gatherings with Friends and Family: Three times a week    Attends Religious Services: 1 to 4 times per year    Active Member of Clubs or Organizations: No    Attends Banker Meetings: Never    Marital Status: Separated  Intimate Partner Violence: Not At Risk (11/03/2022)   Humiliation, Afraid, Rape, and Kick questionnaire    Fear of Current or Ex-Partner: No    Emotionally Abused: No    Physically Abused: No    Sexually Abused: No    FAMILY HISTORY: Family History  Problem Relation Age of Onset   Cancer Sister    Diabetes Mother    Cancer Maternal Grandmother    Cancer Paternal Grandmother     ALLERGIES:  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  MEDICATIONS:  Current Outpatient Medications  Medication Sig Dispense Refill   capecitabine (XELODA) 500 MG tablet Take 3 tablets (1,500 mg total) by mouth 2 (two) times daily after a meal. Take Monday thru Friday. Take only on days of radiation. 150 tablet 0   citalopram (CELEXA) 10 MG tablet Take 1 tablet (10 mg total) by mouth daily. 30 tablet 3   lidocaine-prilocaine (EMLA) cream Apply 1 Application topically as needed. Apply to  port and cover with saran wrap 1-2 hours prior to port access 30 g 2   loperamide (IMODIUM) 2 MG capsule Take 2 capsules (4mg ) by mouth initially. Then take 1 capsule by mouth every 2 hours (4mg  every 4 hours at night). Take no more than 16mg /day 90 capsule 2   ondansetron (ZOFRAN) 8 MG tablet Take 1 tablet (8 mg total) by mouth every 8 (eight) hours as needed for nausea or vomiting. 90 tablet 1   prochlorperazine (COMPAZINE) 10 MG tablet Take 1 tablet (10 mg total) by mouth every 6 (six) hours as needed for nausea or vomiting. 90 tablet 1   feeding supplement (ENSURE ENLIVE / ENSURE PLUS) LIQD Take 237 mLs by mouth daily. (Patient not taking: Reported on 10/27/2022) 237 mL 12   nicotine (NICODERM CQ - DOSED IN MG/24 HR) 7 mg/24hr patch Place 1 patch (7 mg total) onto the skin daily. 28 patch 2   potassium chloride SA (KLOR-CON M) 20 MEQ tablet Take 1 tablet (20 mEq total) by mouth daily. BYRD fund to pay (Patient not taking: Reported on 09/22/2022) 3 tablet 0   traZODone (DESYREL) 50 MG tablet Take 1 tablet (50 mg total) by mouth at bedtime as needed for sleep. (Patient not taking: Reported on 12/26/2022) 30 tablet 3   No current facility-administered medications for this visit.     PHYSICAL EXAMINATION: ECOG PERFORMANCE STATUS: 1 - Symptomatic but completely ambulatory Vitals:   01/07/23 0951  BP: (!) 132/100  Pulse: 87  Resp: 18  Temp: (!) 96.2 F (35.7 C)     Filed Weights   01/07/23 0951  Weight: 163 lb (73.9 kg)      Physical Exam HENT:     Head: Normocephalic and atraumatic.  Eyes:     General: No scleral icterus. Cardiovascular:     Rate and Rhythm: Normal rate and regular rhythm.     Heart sounds: Normal heart sounds.  Pulmonary:     Effort: Pulmonary effort is normal. No respiratory distress.     Breath sounds: No wheezing.     Comments: Decreased breath sound bilaterally.  Abdominal:     General: Bowel sounds are normal. There is no distension.     Palpations:  Abdomen is soft.  Comments: Colostomy bag  Genitourinary:    Comments: Status post APR, Musculoskeletal:        General: No deformity. Normal range of motion.     Cervical back: Normal range of motion and neck supple.  Skin:    General: Skin is warm and dry.     Findings: No erythema or rash.  Neurological:     Mental Status: He is alert and oriented to person, place, and time. Mental status is at baseline.     Cranial Nerves: No cranial nerve deficit.     Coordination: Coordination normal.  Psychiatric:        Mood and Affect: Mood normal.     LABORATORY DATA:  I have reviewed the data as listed    Latest Ref Rng & Units 01/07/2023    8:56 AM 12/09/2022    7:41 AM 10/27/2022    8:40 AM  CBC  WBC 4.0 - 10.5 K/uL 5.3  4.9  5.1   Hemoglobin 13.0 - 17.0 g/dL 40.9  81.1  91.4   Hematocrit 39.0 - 52.0 % 41.6  43.8  39.3   Platelets 150 - 400 K/uL 183  206  361       Latest Ref Rng & Units 01/07/2023    8:55 AM 10/27/2022    8:40 AM 10/13/2022    2:39 PM  CMP  Glucose 70 - 99 mg/dL 782  956  94   BUN 6 - 20 mg/dL 15  11  10    Creatinine 0.61 - 1.24 mg/dL 2.13  0.86  5.78   Sodium 135 - 145 mmol/L 136  139  133   Potassium 3.5 - 5.1 mmol/L 3.5  3.8  3.8   Chloride 98 - 111 mmol/L 105  104  101   CO2 22 - 32 mmol/L 22  28  24    Calcium 8.9 - 10.3 mg/dL 8.6  8.8  8.2   Total Protein 6.5 - 8.1 g/dL 7.3  8.2  8.0   Total Bilirubin 0.3 - 1.2 mg/dL 0.3  0.4  0.6   Alkaline Phos 38 - 126 U/L 96  87  87   AST 15 - 41 U/L 34  20  20   ALT 0 - 44 U/L 22  16  14       RADIOGRAPHIC STUDIES: I have personally reviewed the radiological images as listed and agreed with the findings in the report. NM PET Image Restage (PS) Skull Base to Thigh (F-18 FDG)  Result Date: 12/18/2022 CLINICAL DATA:  Initial treatment strategy for rectal cancer. EXAM: NUCLEAR MEDICINE PET SKULL BASE TO THIGH TECHNIQUE: 8.9 mCi F-18 FDG was injected intravenously. Full-ring PET imaging was performed from the skull  base to thigh after the radiotracer. CT data was obtained and used for attenuation correction and anatomic localization. Fasting blood glucose: 85 mg/dl COMPARISON:  MR pelvis 11/14/2022, CT abdomen pelvis 10/13/2022, CT chest 10/09/2022. FINDINGS: Mediastinal blood pool activity: SUV max 2.3 Liver activity: SUV max NA NECK: No hypermetabolic lymph nodes. Incidental CT findings: None. CHEST: No abnormal hypermetabolism. Incidental CT findings: Right IJ Port-A-Cath terminates near the SVC RA junction. Atherosclerotic calcification of the aorta. Heart size within normal limits. No pericardial or pleural effusion. Centrilobular emphysema. ABDOMEN/PELVIS: Extensive abnormal hypermetabolism associated with soft tissue thickening/mass in the presacral space, status post low anterior resection with SUV max up to 16.1 inferiorly. Given the amorphous nature of the soft tissue, measurement is challenging. Rough axial dimensions are 4.0 x 5.6 cm (4/143).  No additional abnormal hypermetabolism. Incidental CT findings: Probable cyst in the posterior right hepatic lobe. Liver, gallbladder and adrenal glands are otherwise unremarkable. Subcentimeter low-attenuation lesion in the right kidney, too small to characterize. No specific follow-up necessary. Visualized portions of the left kidney, spleen, pancreas and stomach are otherwise unremarkable. Left lower quadrant colostomy. SKELETON: No abnormal hypermetabolism. Incidental CT findings: Degenerative changes in the spine. IMPRESSION: 1. FDG avid soft tissue thickening/mass in the surgical bed, status post low anterior section, indicative of disease recurrence. No evidence of metastatic disease. 2.  Aortic atherosclerosis (ICD10-I70.0). 3.  Emphysema (ICD10-J43.9). Electronically Signed   By: Leanna Battles M.D.   On: 12/18/2022 13:31   CT ABDOMINAL MASS BIOPSY  Result Date: 12/09/2022 INDICATION: 57 year old male referred for biopsy of pelvic mass EXAM: CT BIOPSY MEDICATIONS:  None. ANESTHESIA/SEDATION: Moderate (conscious) sedation was employed during this procedure. A total of Versed 2.0 mg and Fentanyl 100 mcg was administered intravenously. Moderate Sedation Time: 20 minutes. The patient's level of consciousness and vital signs were monitored continuously by radiology nursing throughout the procedure under my direct supervision. FLUOROSCOPY TIME:  CT COMPLICATIONS: None PROCEDURE: Informed written consent was obtained from the patient after a thorough discussion of the procedural risks, benefits and alternatives. All questions were addressed. Maximal Sterile Barrier Technique was utilized including caps, mask, sterile gowns, sterile gloves, sterile drape, hand hygiene and skin antiseptic. A timeout was performed prior to the initiation of the procedure. The procedure risks, benefits, and alternatives were explained to the patient. Questions regarding the procedure were encouraged and answered. The patient understands and consents to the procedure. Patient was position prone on the CT gantry table. Scout CT of the pelvis was performed for surgical planning purposes. The posterior pelvis was prepped with Chlorhexidine in a sterile fashion, and a sterile drape was applied covering the operative field. A sterile gown and sterile gloves were used for the procedure. Local anesthesia was provided with 1% Lidocaine. Using CT guidance, introducer needle was advanced into the soft tissue mass at the level of the coccyx. Once we confirmed needle tip position multiple 18 gauge core biopsies were performed with images placed into formalin solution. Needle was removed and a final CT was acquired. Manual pressure was used for hemostasis and a sterile dressing was placed. No complications were encountered no significant blood loss was encountered. Patient tolerated the procedure well and remained hemodynamically stable throughout. IMPRESSION: Status post CT-guided biopsy of pelvic mass. Signed, Yvone Neu. Miachel Roux, RPVI [facility] Electronically Signed   By: Gilmer Mor D.O.   On: 12/09/2022 10:10   MR PELVIS W WO CONTRAST  Result Date: 11/15/2022 CLINICAL DATA:  Presacral mass, history of rectal cancer status post resection with colostomy EXAM: MRI PELVIS WITHOUT AND WITH CONTRAST TECHNIQUE: Multiplanar multisequence MR imaging of the pelvis was performed both before and after administration of intravenous contrast. CONTRAST:  7mL GADAVIST GADOBUTROL 1 MMOL/ML IV SOLN COMPARISON:  CT abdomen pelvis, 10/13/2022, MR pelvis, 09/29/2018 FINDINGS: Urinary Tract: Unchanged thickening of the urinary bladder wall (series 8, image 22). Bowel: Status post abdominoperineal resection with left lower quadrant end colostomy. Compared to prior CT and MR, no significant change in appearance of heterogeneous, rim enhancing presacral and low pelvic soft tissue and fluid. Largest heterogeneously enhancing conglomerate of fluid and soft tissue appears to closely involve the anteriorly abutting the seminal vesicles and measures 3.0 x 2.9 cm in largest axial dimension (series 11, image 22). This  extends to superiorly contact the posterior bladder dome (series 11, image 15, series 5, image 17) and inferiorly towards the gluteal cleft. There is a fluid component inferiorly measuring 2.4 x 1.3 cm (series 11, image 29). Additional intrinsically T1 hyperintense, rim enhancing hemorrhagic or proteinaceous fluid collection in the right hemipelvis measuring 3.2 x 2.6 cm (series 11, image 19). Vascular/Lymphatic: No pathologically enlarged lymph nodes. No significant vascular abnormality seen. Reproductive:  No mass or other significant abnormality Other:  None. Musculoskeletal: No suspicious bone lesions identified. IMPRESSION: 1. Status post abdominoperineal resection with left lower quadrant end colostomy. 2. Compared to prior CT and MR, no significant change in  appearance of heterogeneous, rim enhancing presacral and low pelvic soft tissue and fluid. Largest heterogeneously enhancing conglomerate of fluid and soft tissue appears to closely involve the anteriorly abutting the seminal vesicles and measures 3.0 x 2.9 cm in largest axial dimension. This extends to superiorly contact the posterior bladder dome and inferiorly towards the gluteal cleft. 3. Discrete fluid component at the most inferior extent measuring 2.4 x 1.3 cm. This is markedly diminished in volume compared to more remote previous examinations, for example 06/26/2022. 4. Constellation of findings is highly concerning for locally recurrent rectal malignancy with or without superimposed infection. Presence or absence of infection at this time is not established by MR. 5. Additional unchanged hemorrhagic or proteinaceous fluid collection in the right hemipelvis measuring 3.2 x 2.6 cm most consistent with postoperative hematoma or seroma. 6. Unchanged thickening of the urinary bladder wall. As previously reported, fistula or involvement of bladder wall by malignancy not excluded. Electronically Signed   By: Jearld Lesch M.D.   On: 11/15/2022 15:36   CT ABDOMEN PELVIS W CONTRAST  Result Date: 10/13/2022 CLINICAL DATA:  Abdominal pain, postop rectal pain. EXAM: CT ABDOMEN AND PELVIS WITH CONTRAST TECHNIQUE: Multidetector CT imaging of the abdomen and pelvis was performed using the standard protocol following bolus administration of intravenous contrast. RADIATION DOSE REDUCTION: This exam was performed according to the departmental dose-optimization program which includes automated exposure control, adjustment of the mA and/or kV according to patient size and/or use of iterative reconstruction technique. CONTRAST:  OMNIPAQUE IOHEXOL 300 MG/ML  SOLN COMPARISON:  CT abdomen dated 06/26/2022. FINDINGS: Lower chest: No acute abnormality. Hepatobiliary: No acute or suspicious findings within the liver.  Gallbladder is unremarkable. No bile duct dilatation is seen. Pancreas: Unremarkable. No pancreatic ductal dilatation or surrounding inflammatory changes. Spleen: Normal in size without focal abnormality. Adrenals/Urinary Tract: Adrenal glands are unremarkable. Kidneys are unremarkable without suspicious mass, stone or hydronephrosis. Bladder walls are thick walled/edematous. Stomach/Bowel: No dilated large or small bowel loops. LEFT lower abdominal wall colostomy, without obstruction or inflammatory change. Vascular/Lymphatic: Aortic atherosclerosis. No acute-appearing vascular abnormality. Reproductive: History of prostatectomy. Other: Complex hypodense collection extending from the anal verge inferiorly into the presacral space and anteriorly from the presacral space to the bladder dome, not significantly changed in size or extent compared to the earlier CT of 06/26/2022. Stable rounded fluid collection along the posterior portion of the RIGHT pelvic sidewall, not contiguous with the aforementioned fluid collection in the presacral space and therefore most likely an unrelated postsurgical seroma. Musculoskeletal: No acute-appearing osseous abnormality. No evidence of osteomyelitis. IMPRESSION: 1. Complex collection persists in the lower pelvis, extending from the anal verge upwards into the presacral space and anteriorly from the presacral space to the bladder dome, not significantly changed in size or extent compared to the earlier CT of 06/26/2022. This is most  likely a combination of a postoperative seroma and/or chronic phlegmon/abscess versus recurrent abscess. 2. Bladder walls are thick walled/edematous. Given the contiguity of the presacral collection and the bladder dome, this is highly suspicious for a related bladder wall infection and possibly secondary to colovesical fistula. Recommend correlation with urinalysis. 3. No evidence of bowel obstruction. LEFT lower abdominal wall colostomy, without  obstruction or inflammatory change. 4. No free intraperitoneal air. Aortic Atherosclerosis (ICD10-I70.0). Electronically Signed   By: Bary Richard M.D.   On: 10/13/2022 17:53   DG Chest Port 1 View  Result Date: 10/13/2022 CLINICAL DATA:  Sepsis EXAM: PORTABLE CHEST 1 VIEW COMPARISON:  06/26/2022, 10/09/2022 FINDINGS: Right IJ approach chest port remains in place. The heart size and mediastinal contours are within normal limits. Both lungs are clear. The visualized skeletal structures are unremarkable. IMPRESSION: No active disease. Electronically Signed   By: Duanne Guess D.O.   On: 10/13/2022 16:47

## 2023-01-07 NOTE — Assessment & Plan Note (Signed)
Port flush every 6-8 weeks.

## 2023-01-07 NOTE — Assessment & Plan Note (Addendum)
Refilled  Nicotin patch to 7mg /24 hours

## 2023-01-08 ENCOUNTER — Other Ambulatory Visit: Payer: Self-pay

## 2023-01-08 ENCOUNTER — Encounter: Payer: Self-pay | Admitting: Oncology

## 2023-01-08 ENCOUNTER — Ambulatory Visit
Admission: RE | Admit: 2023-01-08 | Discharge: 2023-01-08 | Disposition: A | Discharge status: Home / Self Care | Payer: 59 | Source: Ambulatory Visit | Attending: Radiation Oncology | Admitting: Radiation Oncology

## 2023-01-08 DIAGNOSIS — C20 Malignant neoplasm of rectum: Secondary | ICD-10-CM | POA: Diagnosis not present

## 2023-01-08 DIAGNOSIS — Z933 Colostomy status: Secondary | ICD-10-CM | POA: Diagnosis not present

## 2023-01-08 DIAGNOSIS — R911 Solitary pulmonary nodule: Secondary | ICD-10-CM | POA: Diagnosis not present

## 2023-01-08 DIAGNOSIS — Z9221 Personal history of antineoplastic chemotherapy: Secondary | ICD-10-CM | POA: Diagnosis not present

## 2023-01-08 DIAGNOSIS — Z510 Encounter for antineoplastic radiation therapy: Secondary | ICD-10-CM | POA: Diagnosis not present

## 2023-01-08 LAB — RAD ONC ARIA SESSION SUMMARY
Course Elapsed Days: 0
Plan Fractions Treated to Date: 1
Plan Prescribed Dose Per Fraction: 1.8 Gy
Plan Total Fractions Prescribed: 25
Plan Total Prescribed Dose: 45 Gy
Reference Point Dosage Given to Date: 1.8 Gy
Reference Point Session Dosage Given: 1.8 Gy
Session Number: 1

## 2023-01-08 LAB — CEA: CEA: 2.4 ng/mL (ref 0.0–4.7)

## 2023-01-09 ENCOUNTER — Ambulatory Visit
Admission: RE | Admit: 2023-01-09 | Discharge: 2023-01-09 | Disposition: A | Discharge status: Home / Self Care | Payer: 59 | Source: Ambulatory Visit | Attending: Radiation Oncology | Admitting: Radiation Oncology

## 2023-01-09 ENCOUNTER — Other Ambulatory Visit: Payer: Self-pay

## 2023-01-09 DIAGNOSIS — Z933 Colostomy status: Secondary | ICD-10-CM | POA: Diagnosis not present

## 2023-01-09 DIAGNOSIS — Z510 Encounter for antineoplastic radiation therapy: Secondary | ICD-10-CM | POA: Diagnosis not present

## 2023-01-09 DIAGNOSIS — Z9221 Personal history of antineoplastic chemotherapy: Secondary | ICD-10-CM | POA: Diagnosis not present

## 2023-01-09 DIAGNOSIS — C20 Malignant neoplasm of rectum: Secondary | ICD-10-CM | POA: Diagnosis not present

## 2023-01-09 DIAGNOSIS — R911 Solitary pulmonary nodule: Secondary | ICD-10-CM | POA: Diagnosis not present

## 2023-01-09 LAB — RAD ONC ARIA SESSION SUMMARY
Course Elapsed Days: 1
Plan Fractions Treated to Date: 2
Plan Prescribed Dose Per Fraction: 1.8 Gy
Plan Total Fractions Prescribed: 25
Plan Total Prescribed Dose: 45 Gy
Reference Point Dosage Given to Date: 3.6 Gy
Reference Point Session Dosage Given: 1.8 Gy
Session Number: 2

## 2023-01-09 MED ORDER — LIDOCAINE-PRILOCAINE 2.5-2.5 % EX CREA: 1.0000 | TOPICAL_CREAM | CUTANEOUS | 2 refills | Status: DC | PRN

## 2023-01-12 ENCOUNTER — Other Ambulatory Visit: Payer: Self-pay

## 2023-01-12 ENCOUNTER — Ambulatory Visit
Admission: RE | Admit: 2023-01-12 | Discharge: 2023-01-12 | Disposition: A | Discharge status: Home / Self Care | Payer: 59 | Source: Ambulatory Visit | Attending: Radiation Oncology | Admitting: Radiation Oncology

## 2023-01-12 DIAGNOSIS — Z9221 Personal history of antineoplastic chemotherapy: Secondary | ICD-10-CM | POA: Diagnosis not present

## 2023-01-12 DIAGNOSIS — Z510 Encounter for antineoplastic radiation therapy: Secondary | ICD-10-CM | POA: Diagnosis not present

## 2023-01-12 DIAGNOSIS — C20 Malignant neoplasm of rectum: Secondary | ICD-10-CM | POA: Diagnosis not present

## 2023-01-12 DIAGNOSIS — R911 Solitary pulmonary nodule: Secondary | ICD-10-CM | POA: Diagnosis not present

## 2023-01-12 DIAGNOSIS — Z933 Colostomy status: Secondary | ICD-10-CM | POA: Diagnosis not present

## 2023-01-12 LAB — RAD ONC ARIA SESSION SUMMARY
Course Elapsed Days: 4
Plan Fractions Treated to Date: 3
Plan Prescribed Dose Per Fraction: 1.8 Gy
Plan Total Fractions Prescribed: 25
Plan Total Prescribed Dose: 45 Gy
Reference Point Dosage Given to Date: 5.4 Gy
Reference Point Session Dosage Given: 1.8 Gy
Session Number: 3

## 2023-01-13 ENCOUNTER — Other Ambulatory Visit (HOSPITAL_COMMUNITY): Payer: Self-pay | Admitting: Nurse Practitioner

## 2023-01-13 ENCOUNTER — Other Ambulatory Visit: Payer: Self-pay

## 2023-01-13 ENCOUNTER — Ambulatory Visit (HOSPITAL_BASED_OUTPATIENT_CLINIC_OR_DEPARTMENT_OTHER)
Admission: RE | Admit: 2023-01-13 | Discharge: 2023-01-13 | Disposition: A | Discharge status: Home / Self Care | Payer: 59 | Source: Ambulatory Visit

## 2023-01-13 ENCOUNTER — Inpatient Hospital Stay: Payer: 59

## 2023-01-13 ENCOUNTER — Inpatient Hospital Stay (HOSPITAL_BASED_OUTPATIENT_CLINIC_OR_DEPARTMENT_OTHER): Payer: 59 | Admitting: Oncology

## 2023-01-13 ENCOUNTER — Encounter: Payer: Self-pay | Admitting: Oncology

## 2023-01-13 ENCOUNTER — Ambulatory Visit
Admission: RE | Admit: 2023-01-13 | Discharge: 2023-01-13 | Disposition: A | Discharge status: Home / Self Care | Payer: 59 | Source: Ambulatory Visit | Attending: Radiation Oncology | Admitting: Radiation Oncology

## 2023-01-13 VITALS — BP 136/100 | HR 82 | Temp 98.2°F | Resp 18 | Wt 165.7 lb

## 2023-01-13 DIAGNOSIS — Z510 Encounter for antineoplastic radiation therapy: Secondary | ICD-10-CM | POA: Diagnosis not present

## 2023-01-13 DIAGNOSIS — Z9221 Personal history of antineoplastic chemotherapy: Secondary | ICD-10-CM | POA: Diagnosis not present

## 2023-01-13 DIAGNOSIS — R911 Solitary pulmonary nodule: Secondary | ICD-10-CM | POA: Diagnosis not present

## 2023-01-13 DIAGNOSIS — Z433 Encounter for attention to colostomy: Secondary | ICD-10-CM

## 2023-01-13 DIAGNOSIS — E876 Hypokalemia: Secondary | ICD-10-CM | POA: Diagnosis not present

## 2023-01-13 DIAGNOSIS — K9400 Colostomy complication, unspecified: Secondary | ICD-10-CM | POA: Diagnosis not present

## 2023-01-13 DIAGNOSIS — C20 Malignant neoplasm of rectum: Secondary | ICD-10-CM

## 2023-01-13 DIAGNOSIS — Z85048 Personal history of other malignant neoplasm of rectum, rectosigmoid junction, and anus: Secondary | ICD-10-CM | POA: Insufficient documentation

## 2023-01-13 DIAGNOSIS — Z933 Colostomy status: Secondary | ICD-10-CM | POA: Diagnosis not present

## 2023-01-13 DIAGNOSIS — L24B3 Irritant contact dermatitis related to fecal or urinary stoma or fistula: Secondary | ICD-10-CM

## 2023-01-13 DIAGNOSIS — Z79899 Other long term (current) drug therapy: Secondary | ICD-10-CM | POA: Insufficient documentation

## 2023-01-13 DIAGNOSIS — Z87891 Personal history of nicotine dependence: Secondary | ICD-10-CM

## 2023-01-13 LAB — CBC WITH DIFFERENTIAL (CANCER CENTER ONLY)
Abs Immature Granulocytes: 0.02 10*3/uL (ref 0.00–0.07)
Basophils Absolute: 0 10*3/uL (ref 0.0–0.1)
Basophils Relative: 1 %
Eosinophils Absolute: 0.2 10*3/uL (ref 0.0–0.5)
Eosinophils Relative: 4 %
HCT: 40.7 % (ref 39.0–52.0)
Hemoglobin: 13.9 g/dL (ref 13.0–17.0)
Immature Granulocytes: 0 %
Lymphocytes Relative: 29 %
Lymphs Abs: 1.6 10*3/uL (ref 0.7–4.0)
MCH: 30 pg (ref 26.0–34.0)
MCHC: 34.2 g/dL (ref 30.0–36.0)
MCV: 87.7 fL (ref 80.0–100.0)
Monocytes Absolute: 0.7 10*3/uL (ref 0.1–1.0)
Monocytes Relative: 13 %
Neutro Abs: 2.9 10*3/uL (ref 1.7–7.7)
Neutrophils Relative %: 53 %
Platelet Count: 196 10*3/uL (ref 150–400)
RBC: 4.64 MIL/uL (ref 4.22–5.81)
RDW: 16.1 % — ABNORMAL HIGH (ref 11.5–15.5)
WBC Count: 5.4 10*3/uL (ref 4.0–10.5)
nRBC: 0 % (ref 0.0–0.2)

## 2023-01-13 LAB — RAD ONC ARIA SESSION SUMMARY
Course Elapsed Days: 5
Plan Fractions Treated to Date: 4
Plan Prescribed Dose Per Fraction: 1.8 Gy
Plan Total Fractions Prescribed: 25
Plan Total Prescribed Dose: 45 Gy
Reference Point Dosage Given to Date: 7.2 Gy
Reference Point Session Dosage Given: 1.8 Gy
Session Number: 4

## 2023-01-13 LAB — CMP (CANCER CENTER ONLY)
ALT: 23 U/L (ref 0–44)
AST: 23 U/L (ref 15–41)
Albumin: 3.7 g/dL (ref 3.5–5.0)
Alkaline Phosphatase: 77 U/L (ref 38–126)
Anion gap: 6 (ref 5–15)
BUN: 11 mg/dL (ref 6–20)
CO2: 25 mmol/L (ref 22–32)
Calcium: 8.8 mg/dL — ABNORMAL LOW (ref 8.9–10.3)
Chloride: 105 mmol/L (ref 98–111)
Creatinine: 0.97 mg/dL (ref 0.61–1.24)
GFR, Estimated: >60 mL/min (ref >60)
Glucose, Bld: 140 mg/dL — ABNORMAL HIGH (ref 70–99)
Potassium: 3.6 mmol/L (ref 3.5–5.1)
Sodium: 136 mmol/L (ref 135–145)
Total Bilirubin: 0.4 mg/dL (ref 0.3–1.2)
Total Protein: 7.5 g/dL (ref 6.5–8.1)

## 2023-01-13 MED ORDER — HEPARIN SOD (PORK) LOCK FLUSH 100 UNIT/ML IV SOLN
500.0000 [IU] | Freq: Once | INTRAVENOUS | Status: AC
  Filled 2023-01-13: qty 5

## 2023-01-13 MED ORDER — SODIUM CHLORIDE 0.9% FLUSH
10.0000 mL | Freq: Once | INTRAVENOUS | Status: AC
  Administered 2023-01-13: 10 mL via INTRAVENOUS
  Filled 2023-01-13: qty 10

## 2023-01-13 MED ORDER — HEPARIN SOD (PORK) LOCK FLUSH 100 UNIT/ML IV SOLN
500.0000 [IU] | Freq: Once | INTRAVENOUS | Status: AC
  Administered 2023-01-13: 500 [IU] via INTRAVENOUS
  Filled 2023-01-13: qty 5

## 2023-01-13 NOTE — Progress Notes (Signed)
Hematology/Oncology Progress note [phone] [phone]      CHIEF COMPLAINTS/REASON FOR VISIT:  Follow up for recurrent rectal cancer treatment  ASSESSMENT & PLAN:   Cancer Staging  Rectal cancer [facility]) Staging form: Colon and Rectum, AJCC 8th Edition - Clinical stage from 02/08/2021: Stage IIIB (cT4a, cN1, cM0) - Signed by [name], MD on 03/06/2021   Rectal cancer [facility]) locally advanced rectal cancer-Stage IIIB, s/p TNT neoadjuvant protocol, concurrent Xeloda and radiation-Finished 08/27/2021, s/p  APR,rectal adenocarcinoma, ypT3N0. Positive radial margin due to perforation. Biopsy proven recurrent rectal cancer, locally advanced disease, possible colovesical fistula; no distant metastatic disease on PET  I have discussed with his [name] Surgeon Dr. Luciano Cutter, recurrence is not resectable.  Recommend combined modality therapy with concurrent Xeloda 825 mg/m2 twice daily  with radiation Labs are reviewed and continue Xeloda 1500mg  BID on treatment days.  Follow up with Radonc for RT.   Imodum and antiemetics prescription sent to pharmacy  Hypokalemia Continue potassium supplementation   Former smoker Continue Nicotin patch to 7mg /24 hours  Colostomy care [facility]) Follow up with ostomy care clinic    Orders Placed This Encounter  Procedures   CBC with Differential (Cancer Center Only)    Standing Status:   Future    Standing Expiration Date:   01/13/2024   CMP (Cancer Center only)    Standing Status:   Future    Standing Expiration Date:   01/13/2024    Follow-up 10 days All questions were answered. The patient knows to call the clinic with any problems, questions or concerns.  [name], MD, PhD [facility] Hematology Oncology 01/13/2023        HISTORY OF PRESENTING ILLNESS:   Barry Josey. is a  57 y.o.  male presents for rectal cancer Oncology History  Rectal cancer (HCC)  02/08/2021 Initial Diagnosis   Rectal cancer    02/05/2021-02/06/2021 patient was hospitalized due to generalized weakness, intermittent lightheadedness, weight loss and worsening constipation.  02/05/2021 CT abdomen showed concerning of severe rectal wall thickening and right internal iliac lymph node concerning for metastatic disease.  Patient was seen by gastroenterology and had colonoscopy which showed a circumferential fungating mass in the rectum.  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   02/14/2021-02/16/2021 hospitalized due to rectal bleeding and pain.   02/14/2021 CT showed perirectal fluid collection/gas concerning for infection with significant leukocytosis, anemia with hemoglobin of 6.8.  Patient received PRBC transfusion, IV antibiotics.  He underwent an IR guided placement of JP drain into the rectal abscess.  Discharged home with oral Augmentin. 02/20/2021 presented to ER with new skin opening and draining from left gluteus.  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  JP drain has been removed.  Patient was continued on Augmentin.   02/13/2021, PET scan showed locally advanced rectal cancer with billowing of the mesorectum now with low attenuation material that was not present on previous examination with extensive stranding and inflammation.   Bulky RIGHT pelvic sidewall/hypogastric lymph node outside of the mesorectum with stippled calcification measuring 19 mm-no increased metabolic activity. Small LEFT hypogastric lymph node just peripheral to the internal external bifurcation-SUV 3.2 High RIGHT internal just below or at the internal/common iliac transition, lymph node -SUV 4.8 LEFT high hypogastric lymph node  8 mm-SUV 3. Scattered lymph nodes throughout the retroperitoneum with low FDG uptake Bilateral inguinal lymph nodes largest on the RIGHT (image 248/3) 11 mm with a maximum SUV of 2.8 spiculated nodule in the LEFT upper  lobe- 9 x 8 mm       02/14/2021, CT abdomen pelvis without contrast Showed  perforated rectal mass with contained perforation with fluid and gas extending above and below the pelvic floor,potentially involving the sphincter complex and extending into LEFT ischial rectal fossa   02/20/2021, CT pelvis with contrast showed Large perirectal/perianal abscess has markedly decreased in size since placement of the percutaneous drain. There are residual gas-filled collections in the soft tissues and suspect there is a fistula or sinus tract between the rectal mass and the subcutaneous tissues. Soft tissue gas along the medial left buttock and concern for a cutaneous ulceration in this area. Large rectal mass with evidence for a large necrotic right pelvic lymph node.   His case is complicated with a perirectal abscess. Prior to onset of perirectal abscess, on his 02/05/2021 scan, he was noted to have right internal iliac lymph node 3 x 1 x 2.3 cm which is concerning for nodal disease.On Subsequent images it was difficult to distinguish whether lymphadenopathy was due to nodal disease versus acute inflammation. 03/07/2021 MRI pelvis cT3 N2. Due to the possible contained perforation on previous CT, possible cT4 disease.    02/27/2021 medi port placed by Dr.Dew 03/13/2021 reports right butt cheek and also left perianal area fullness.he was seen by Dr.Pabon urgently  and had right buttock abscess drained. Left perianal fullness was felt to be due to cancer.    02/08/2021 Cancer Staging   Staging form: Colon and Rectum, AJCC 8th Edition - Clinical stage from 02/08/2021: Stage IIIB (cT4a, cN1, cM0) - Signed by [name], MD on 03/06/2021 Stage prefix: Initial diagnosis   02/27/2021 Procedure   medi port placed by Dr.Dew   03/18/2021 - 07/05/2021 Chemotherapy    FOLFOX q14d x 4 months      07/17/2021 - 08/27/2021 Chemotherapy   Xeloda concurrent with Radiation   11/18/2021 Surgery   patient is status post open APR with flap for rectal cancer. Pathology rectal adenomacarcinoma ypT3N0.  Positive  margin: Radial (circumferential) or mesenteric: adjacent to perforation      01/27/2022 - 02/02/2022 Hospital Admission   Hospitalized at [facility] due to recurrent abscess.  US guided drain into the pelvic abscess with return of 60 ml of purulent fluid. Cultures positive for staph aureus and strep agalactiae group b, fungal cultures negative.    02/25/2022 Imaging   CT chest angiogram with and without contrast, CT abdomen pelvis with and without contrast 1. Negative for acute pulmonary embolus.2. Emphysema. Further decrease in size of the previously noted irregular nodule in the left upper lobe which is now barely measurable today. No new suspicious lung nodules 3. Status post left lower quadrant colostomy. Further decrease in size since comparison exam from May of the rim enhancing gas and fluid collection at the pelvic surgical bed extending from the perineum superiorly into the pelvis 4. Slightly thickened appearance of terminal ileal small bowel loops in the pelvis with mild stranding suggesting small bowel inflammatory process. 5. Stable enlarged right pelvic sidewall lymph node   06/02/2022 Imaging   CT chest abdomen pelvis w contrast 1. Status post abdominal perineal resection with descending colostomy. 2. At the site of pelvic fluid and gas collection on 02/25/2022, there is residual, decreased presacral soft tissue fullness, without drainable collection. 3. Similar right obturator nodal metastasis.4.  No acute process or evidence of metastatic disease in the chest. 5. Age advanced coronary artery atherosclerosis. Recommend assessment of coronary risk factors. 6. Aortic atherosclerosis and emphysema  06/26/2022 Imaging   CT abdomen pelvis with contrast showed 1. Interval increase in size of a lobulated partially imaged at least 7.8 x 3.8 x 12 cm abscess in the perianal region in a patient status post abdominal perineal resection and left lower lobe end colostomy formation. Finding  extends to involve the pre sacral region up to the urinary dome level. Associated posterior urinary bladder wall thickening with lack of intraperitoneal fat plane between the presacral soft tissue thickening/abscess formation suggestive of possible fistulization and invasion of the posterior bladder wall. Underlying recurrent malignancy is not excluded. 2. Stable 2.7 cm right pelvic sidewall lymph node. 3.  Aortic Atherosclerosis   06/26/2022 - 06/27/2022 Hospital Admission   He went to [facility] ER and CT showed recurrent abscess. He was transferred to [facility] due to recurrent abscess, treated with IV vanc/zosyn, IR was consulted and drainage tube was placed. Wound grew up Strep viridans. Patient was seen by ID at [facility] discharged with Augmentin for 2 weeks.   He was seen by wound care Dr> Kandace Blitz on 07/09/2022, JP drain was removed.    09/29/2022 Imaging   MRI pelvis without contrast showed  Interval abdominoperitoneal resection since prior MRI. Decreased small fluid collection in the surgical bed compared with more recent CT, consistent with resolving postoperative fluid collection or abscess.   Bulky rounded presacral mass, which shows diffuse restricted diffusion, without significant change in size since most recent CT of 06/26/2022. This raises suspicion for recurrent carcinoma over post treatment changes. Recommend correlation with CEA level, and consider tissue sampling or PET-CT.   Stable enlarged right pelvic sidewall lymph node. No new or increased adenopathy identified.   10/09/2022 Imaging   CT chest with contrast showed 1. Unchanged 0.4 cm fissural nodule of the anterior left lower lobe. This is almost certainly a benign fissural lymph node. No new or suspicious pulmonary nodules. 2. Moderate emphysema and diffuse bilateral bronchial wall thickening. 3. Coronary artery disease.   10/13/2022 Imaging   CT abdomen pelvis with contrast showed 1. Complex collection persists in the lower  pelvis, extending from the anal verge upwards into the presacral space and anteriorly from the presacral space to the bladder dome, not significantly changed in size or extent compared to the earlier CT of 06/26/2022. This is most likely a combination of a postoperative seroma and/or chronic phlegmon/abscess versus recurrent abscess. 2. Bladder walls are thick walled/edematous. Given the contiguity of the presacral collection and the bladder dome, this is highly suspicious for a related bladder wall infection and possibly secondary to colovesical fistula. Recommend correlation with urinalysis. 3. No evidence of bowel obstruction. LEFT lower abdominal wall colostomy, without obstruction or inflammatory change. 4. No free intraperitoneal air   10/13/2022 [facility] Admission   Hospitalized due to fever and rectal pain. Urine culture showed mixed urogenital flora. IR CT guided Aspiration of abscess showed Streptococcal Viridans. He was placed back on Augmentin  10/14/2023 CT read by [facility] radiology Postsurgical changes of abdominoperineal resection. Interval enlargement of  fluid collection in the surgical bed. Multiple foci of enhancing tissue at  the margins of this fluid collection, increased from prior study, suspicious for local recurrence.   Prominent retroperitoneal lymph nodes, some which are increased in size from prior study. These are indeterminate and may be either reactive or represent metastatic disease. Recommend continued attention on follow-up.   Centrally hypoattenuating right obturator lymph node, unchanged in size, consistent with treated disease.   Circumferential bladder wall thickening and irregularity, most  pronounced on the left side. This may be reactive in the setting of adjacent pelvic inflammatory changes. Correlate with urinalysis if there is clinical  concern for urinary tract infection.    11/14/2022 Imaging   MRI pelvis w wo contrast  1. Status post abdominoperineal  resection with left lower quadrant end colostomy. 2. Compared to prior CT and MR, no significant change in appearance of heterogeneous, rim enhancing presacral and low pelvic soft tissue and fluid. Largest heterogeneously enhancing conglomerate of fluid and soft tissue appears to closely involve the anteriorly abutting the seminal vesicles and measures 3.0 x 2.9 cm in largest axial dimension. This extends to superiorly contact the posterior bladder dome and inferiorly towards the gluteal cleft. 3. Discrete fluid component at the most inferior extent measuring 2.4 x 1.3 cm. This is markedly diminished in volume compared to more remote previous examinations, for example 06/26/2022. 4. Constellation of findings is highly concerning for locally recurrent rectal malignancy with or without superimposed infection. Presence or absence of infection at this time is not established by MR. 5. Additional unchanged hemorrhagic or proteinaceous fluid collection in the right hemipelvis measuring 3.2 x 2.6 cm most consistent with postoperative hematoma or seroma. 6. Unchanged thickening of the urinary bladder wall. As previously reported, fistula or involvement of bladder wall by malignancy not excluded.   12/09/2022 Procedure   CT guided biopsy of the pelvic mass showed Moderately differentiated adenocarcinoma with dirty necrosis, morphologically identical to patient's prior rectal adenocarcinoma.   [facility] NGS xT 648 panel showed MLH3 start loss,  BCORL1 frame shift, TP53 splice region varian, APC stop gain,  TMB 7.9, MS stable, KRAS/BRAF/NRAS negative.   [facility] NGS xR showed no gene arrangement or reportable altered splicing events in RNA sequencing      01/07/2023 -  Chemotherapy   Xeloda 825 mg/m2 twice daily  with radiation     Patient presented to emergency room on 12/08/21, rectal pain.  CT scan showed 18.3 cm fluid and gas collection in the pelvic surgical bed compatible with infected collection/abscess.   Patient was sent to [facility] and admitted for.  Patient has CT-guided drain placement on 12/09/2021.  Blood culture positive for Staph epidermidis which was felt to be contaminant.  Fluid culture grew pansensitive Staph aureus, strep pyogenes and candida albicans.  Patient was treated with IV antibiotics and transition to p.o. Augmentin and fluconazole on 12/11/2021.  #Perirectal abscess status post drainage catheter , patient was recently seen by [facility] oncology surgeon on 12/24/2021 and was recommended to keep drainage catheter and continue antibiotics. Patient had a repeat CT scan done which showed a smaller but persistent abscess.  He will return to [facility] surgery for follow-up of drain removal. His case was discussed on [name] tumor board for the positive mesenteric margin which was felt to be secondary to previous perforation.  Recommend surveillance.  INTERVAL HISTORY Barry Budner. is a 57 y.o. male who has above history reviewed by me today presents for follow up visit for management of recurrent locally advanced rectal cancer. +intermittent dysuria, Denies fever chills, rectal pain. Denies rectal discharge.  Loose mushy stool in colostomy bag, improved after taking imodium PRN.  No new complaints.    Review of Systems  Constitutional:  Negative for appetite change, chills, fatigue, fever and unexpected weight change.  HENT:   Negative for hearing loss and voice change.   Eyes:  Negative for eye problems and icterus.  Respiratory:  Negative for chest tightness, cough and shortness of  breath.   Cardiovascular:  Negative for chest pain and leg swelling.  Gastrointestinal:  Negative for abdominal distention, abdominal pain, blood in stool, constipation, diarrhea and rectal pain.       Very occasional rectal drainage  Endocrine: Negative for hot flashes.  Genitourinary:  Negative for difficulty urinating, dysuria and frequency.   Musculoskeletal:  Negative for arthralgias.  Skin:  Negative for  itching and rash.  Neurological:  Negative for light-headedness and numbness.  Hematological:  Negative for adenopathy. Does not bruise/bleed easily.  Psychiatric/Behavioral:  Negative for confusion.     MEDICAL HISTORY:  Past Medical History:  Diagnosis Date   Headache    Left shoulder pain    Rectal cancer (HCC)     SURGICAL HISTORY: Past Surgical History:  Procedure Laterality Date   COLON SURGERY     COLONOSCOPY WITH PROPOFOL N/A 02/06/2021   Procedure: COLONOSCOPY WITH PROPOFOL;  Surgeon: Toney Reil, MD;  Location: [facility] ENDOSCOPY;  Service: Gastroenterology;  Laterality: N/A;   PORTA CATH INSERTION N/A 02/27/2021   Procedure: PORTA CATH INSERTION;  Surgeon: Annice Needy, MD;  Location: [facility] INVASIVE CV LAB;  Service: Cardiovascular;  Laterality: N/A;   ROTATOR CUFF REPAIR Left     SOCIAL HISTORY: Social History   Socioeconomic History   Marital status: Married    Spouse name: Not on file   Number of children: Not on file   Years of education: Not on file   Highest education level: Not on file  Occupational History   Not on file  Tobacco Use   Smoking status: Former    Packs/day: 1.00    Years: 10.00    Additional pack years: 0.00    Total pack years: 10.00    Types: Cigars, Cigarettes    Quit date: 02/04/2021    Years since quitting: 1.9   Smokeless tobacco: Never  Substance and Sexual Activity   Alcohol use: Not Currently   Drug use: No   Sexual activity: Not on file  Other Topics Concern   Not on file  Social History Narrative   Not on file   Social Determinants of Health   Financial Resource Strain: Medium Risk (11/03/2022)   Overall Financial Resource Strain (CARDIA)    Difficulty of Paying Living Expenses: Somewhat hard  Food Insecurity: Food Insecurity Present (11/03/2022)   Hunger Vital Sign    Worried About Running Out of Food in the Last Year: Sometimes true    Ran Out of Food in the Last Year: Sometimes true  Transportation Needs:  No Transportation Needs (11/03/2022)   PRAPARE - Administrator, Civil Service (Medical): No    Lack of Transportation (Non-Medical): No  Physical Activity: Inactive (11/03/2022)   Exercise Vital Sign    Days of Exercise per Week: 0 days    Minutes of Exercise per Session: 0 min  Stress: Stress Concern Present (11/03/2022)   [employer] of Occupational Health - Occupational Stress Questionnaire    Feeling of Stress : Rather much  Social Connections: Moderately Isolated (11/03/2022)   Social Connection and Isolation Panel [NHANES]    Frequency of Communication with Friends and Family: Twice a week    Frequency of Social Gatherings with Friends and Family: Three times a week    Attends Religious Services: 1 to 4 times per year    Active Member of Clubs or Organizations: No    Attends Banker Meetings: Never    Marital Status: Separated  Intimate Partner  Violence: Not At Risk (11/03/2022)   Humiliation, Afraid, Rape, and Kick questionnaire    Fear of Current or Ex-Partner: No    Emotionally Abused: No    Physically Abused: No    Sexually Abused: No    FAMILY HISTORY: Family History  Problem Relation Age of Onset   Cancer Sister    Diabetes Mother    Cancer Maternal Grandmother    Cancer Paternal Grandmother     ALLERGIES:  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  MEDICATIONS:  Current Outpatient Medications  Medication Sig Dispense Refill   capecitabine (XELODA) 500 MG tablet Take 3 tablets (1,500 mg total) by mouth 2 (two) times daily after a meal. Take Monday thru Friday. Take only on days of radiation. 150 tablet 0   citalopram (CELEXA) 10 MG tablet Take 1 tablet (10 mg total) by mouth daily. 30 tablet 3   lidocaine-prilocaine (EMLA) cream Apply 1 Application topically as needed. Apply small amount to port and cover with saran wrap 1-2 hours prior to port access 30 g 2   nicotine (NICODERM CQ - DOSED IN MG/24 HR) 7 mg/24hr patch Place 1 patch (7 mg  total) onto the skin daily. 28 patch 2   feeding supplement (ENSURE ENLIVE / ENSURE PLUS) LIQD Take 237 mLs by mouth daily. (Patient not taking: Reported on 10/27/2022) 237 mL 12   loperamide (IMODIUM) 2 MG capsule Take 2 capsules (4mg ) by mouth initially. Then take 1 capsule by mouth every 2 hours (4mg  every 4 hours at night). Take no more than 16mg /day (Patient not taking: Reported on 01/13/2023) 90 capsule 2   ondansetron (ZOFRAN) 8 MG tablet Take 1 tablet (8 mg total) by mouth every 8 (eight) hours as needed for nausea or vomiting. (Patient not taking: Reported on 01/13/2023) 90 tablet 1   prochlorperazine (COMPAZINE) 10 MG tablet Take 1 tablet (10 mg total) by mouth every 6 (six) hours as needed for nausea or vomiting. (Patient not taking: Reported on 01/13/2023) 90 tablet 1   traZODone (DESYREL) 50 MG tablet Take 1 tablet (50 mg total) by mouth at bedtime as needed for sleep. (Patient not taking: Reported on 12/26/2022) 30 tablet 3   No current facility-administered medications for this visit.   Facility-Administered Medications Ordered in Other Visits  Medication Dose Route Frequency Provider Last Rate Last Admin   heparin lock flush 100 unit/mL  500 Units Intravenous Once [name], MD         PHYSICAL EXAMINATION: ECOG PERFORMANCE STATUS: 1 - Symptomatic but completely ambulatory Vitals:   01/13/23 1351  BP: (!) 136/100  Pulse: 82  Resp: 18  Temp: 98.2 F (36.8 C)     Filed Weights   01/13/23 1351  Weight: 165 lb 11.2 oz (75.2 kg)      Physical Exam HENT:     Head: Normocephalic and atraumatic.  Eyes:     General: No scleral icterus. Cardiovascular:     Rate and Rhythm: Normal rate and regular rhythm.     Heart sounds: Normal heart sounds.  Pulmonary:     Effort: Pulmonary effort is normal. No respiratory distress.     Breath sounds: No wheezing.     Comments: Decreased breath sound bilaterally.  Abdominal:     General: Bowel sounds are normal. There is no distension.      Palpations: Abdomen is soft.     Comments: Colostomy bag  Genitourinary:    Comments: Status post APR, Musculoskeletal:  General: No deformity. Normal range of motion.     Cervical back: Normal range of motion and neck supple.  Skin:    General: Skin is warm and dry.     Findings: No erythema or rash.  Neurological:     Mental Status: He is alert and oriented to person, place, and time. Mental status is at baseline.     Cranial Nerves: No cranial nerve deficit.     Coordination: Coordination normal.  Psychiatric:        Mood and Affect: Mood normal.     LABORATORY DATA:  I have reviewed the data as listed    Latest Ref Rng & Units 01/13/2023   12:54 PM 01/07/2023    8:56 AM 12/09/2022    7:41 AM  CBC  WBC 4.0 - 10.5 K/uL 5.4  5.3  4.9   Hemoglobin 13.0 - 17.0 g/dL 16.1  09.6  04.5   Hematocrit 39.0 - 52.0 % 40.7  41.6  43.8   Platelets 150 - 400 K/uL 196  183  206       Latest Ref Rng & Units 01/13/2023   12:54 PM 01/07/2023    8:55 AM 10/27/2022    8:40 AM  CMP  Glucose 70 - 99 mg/dL 409  811  914   BUN 6 - 20 mg/dL 11  15  11    Creatinine 0.61 - 1.24 mg/dL 7.82  9.56  2.13   Sodium 135 - 145 mmol/L 136  136  139   Potassium 3.5 - 5.1 mmol/L 3.6  3.5  3.8   Chloride 98 - 111 mmol/L 105  105  104   CO2 22 - 32 mmol/L 25  22  28    Calcium 8.9 - 10.3 mg/dL 8.8  8.6  8.8   Total Protein 6.5 - 8.1 g/dL 7.5  7.3  8.2   Total Bilirubin 0.3 - 1.2 mg/dL 0.4  0.3  0.4   Alkaline Phos 38 - 126 U/L 77  96  87   AST 15 - 41 U/L 23  34  20   ALT 0 - 44 U/L 23  22  16       RADIOGRAPHIC STUDIES: I have personally reviewed the radiological images as listed and agreed with the findings in the report. NM PET Image Restage (PS) Skull Base to Thigh (F-18 FDG)  Result Date: 12/18/2022 CLINICAL DATA:  Initial treatment strategy for rectal cancer. EXAM: NUCLEAR MEDICINE PET SKULL BASE TO THIGH TECHNIQUE: 8.9 mCi F-18 FDG was injected intravenously. Full-ring PET imaging was  performed from the skull base to thigh after the radiotracer. CT data was obtained and used for attenuation correction and anatomic localization. Fasting blood glucose: 85 mg/dl COMPARISON:  MR pelvis 11/14/2022, CT abdomen pelvis 10/13/2022, CT chest 10/09/2022. FINDINGS: Mediastinal blood pool activity: SUV max 2.3 Liver activity: SUV max NA NECK: No hypermetabolic lymph nodes. Incidental CT findings: None. CHEST: No abnormal hypermetabolism. Incidental CT findings: Right IJ Port-A-Cath terminates near the SVC RA junction. Atherosclerotic calcification of the aorta. Heart size within normal limits. No pericardial or pleural effusion. Centrilobular emphysema. ABDOMEN/PELVIS: Extensive abnormal hypermetabolism associated with soft tissue thickening/mass in the presacral space, status post low anterior resection with SUV max up to 16.1 inferiorly. Given the amorphous nature of the soft tissue, measurement is challenging. Rough axial dimensions are 4.0 x 5.6 cm (4/143). No additional abnormal hypermetabolism. Incidental CT findings: Probable cyst in the posterior right hepatic lobe. Liver, gallbladder and adrenal glands are otherwise  unremarkable. Subcentimeter low-attenuation lesion in the right kidney, too small to characterize. No specific follow-up necessary. Visualized portions of the left kidney, spleen, pancreas and stomach are otherwise unremarkable. Left lower quadrant colostomy. SKELETON: No abnormal hypermetabolism. Incidental CT findings: Degenerative changes in the spine. IMPRESSION: 1. FDG avid soft tissue thickening/mass in the surgical bed, status post low anterior section, indicative of disease recurrence. No evidence of metastatic disease. 2.  Aortic atherosclerosis (ICD10-I70.0). 3.  Emphysema (ICD10-J43.9). Electronically Signed   By: Leanna Battles M.D.   On: 12/18/2022 13:31   CT ABDOMINAL MASS BIOPSY  Result Date: 12/09/2022 INDICATION: 57 year old male referred for biopsy of pelvic mass  EXAM: CT BIOPSY MEDICATIONS: None. ANESTHESIA/SEDATION: Moderate (conscious) sedation was employed during this procedure. A total of Versed 2.0 mg and Fentanyl 100 mcg was administered intravenously. Moderate Sedation Time: 20 minutes. The patient's level of consciousness and vital signs were monitored continuously by radiology nursing throughout the procedure under my direct supervision. FLUOROSCOPY TIME:  CT COMPLICATIONS: None PROCEDURE: Informed written consent was obtained from the patient after a thorough discussion of the procedural risks, benefits and alternatives. All questions were addressed. Maximal Sterile Barrier Technique was utilized including caps, mask, sterile gowns, sterile gloves, sterile drape, hand hygiene and skin antiseptic. A timeout was performed prior to the initiation of the procedure. The procedure risks, benefits, and alternatives were explained to the patient. Questions regarding the procedure were encouraged and answered. The patient understands and consents to the procedure. Patient was position prone on the CT gantry table. Scout CT of the pelvis was performed for surgical planning purposes. The posterior pelvis was prepped with Chlorhexidine in a sterile fashion, and a sterile drape was applied covering the operative field. A sterile gown and sterile gloves were used for the procedure. Local anesthesia was provided with 1% Lidocaine. Using CT guidance, introducer needle was advanced into the soft tissue mass at the level of the coccyx. Once we confirmed needle tip position multiple 18 gauge core biopsies were performed with images placed into formalin solution. Needle was removed and a final CT was acquired. Manual pressure was used for hemostasis and a sterile dressing was placed. No complications were encountered no significant blood loss was encountered. Patient tolerated the procedure well and remained hemodynamically stable throughout. IMPRESSION: Status post CT-guided biopsy  of pelvic mass. Signed, Yvone Neu. Miachel Roux, RPVI [facility] Electronically Signed   By: Gilmer Mor D.O.   On: 12/09/2022 10:10   MR PELVIS W WO CONTRAST  Result Date: 11/15/2022 CLINICAL DATA:  Presacral mass, history of rectal cancer status post resection with colostomy EXAM: MRI PELVIS WITHOUT AND WITH CONTRAST TECHNIQUE: Multiplanar multisequence MR imaging of the pelvis was performed both before and after administration of intravenous contrast. CONTRAST:  7mL GADAVIST GADOBUTROL 1 MMOL/ML IV SOLN COMPARISON:  CT abdomen pelvis, 10/13/2022, MR pelvis, 09/29/2018 FINDINGS: Urinary Tract: Unchanged thickening of the urinary bladder wall (series 8, image 22). Bowel: Status post abdominoperineal resection with left lower quadrant end colostomy. Compared to prior CT and MR, no significant change in appearance of heterogeneous, rim enhancing presacral and low pelvic soft tissue and fluid. Largest heterogeneously enhancing conglomerate of fluid and soft tissue appears to closely involve the anteriorly abutting the seminal vesicles and measures 3.0 x 2.9 cm in largest axial dimension (series 11, image 22). This extends to superiorly contact the posterior bladder dome (series 11, image 15, series 5, image 17) and inferiorly towards the gluteal cleft.  There is a fluid component inferiorly measuring 2.4 x 1.3 cm (series 11, image 29). Additional intrinsically T1 hyperintense, rim enhancing hemorrhagic or proteinaceous fluid collection in the right hemipelvis measuring 3.2 x 2.6 cm (series 11, image 19). Vascular/Lymphatic: No pathologically enlarged lymph nodes. No significant vascular abnormality seen. Reproductive:  No mass or other significant abnormality Other:  None. Musculoskeletal: No suspicious bone lesions identified. IMPRESSION: 1. Status post abdominoperineal resection with left lower quadrant end colostomy. 2. Compared to prior CT and MR,  no significant change in appearance of heterogeneous, rim enhancing presacral and low pelvic soft tissue and fluid. Largest heterogeneously enhancing conglomerate of fluid and soft tissue appears to closely involve the anteriorly abutting the seminal vesicles and measures 3.0 x 2.9 cm in largest axial dimension. This extends to superiorly contact the posterior bladder dome and inferiorly towards the gluteal cleft. 3. Discrete fluid component at the most inferior extent measuring 2.4 x 1.3 cm. This is markedly diminished in volume compared to more remote previous examinations, for example 06/26/2022. 4. Constellation of findings is highly concerning for locally recurrent rectal malignancy with or without superimposed infection. Presence or absence of infection at this time is not established by MR. 5. Additional unchanged hemorrhagic or proteinaceous fluid collection in the right hemipelvis measuring 3.2 x 2.6 cm most consistent with postoperative hematoma or seroma. 6. Unchanged thickening of the urinary bladder wall. As previously reported, fistula or involvement of bladder wall by malignancy not excluded. Electronically Signed   By: Jearld Lesch M.D.   On: 11/15/2022 15:36

## 2023-01-13 NOTE — Assessment & Plan Note (Addendum)
locally advanced rectal cancer-Stage IIIB, s/p TNT neoadjuvant protocol, concurrent Xeloda and radiation-Finished 08/27/2021, s/p  APR,rectal adenocarcinoma, ypT3N0. Positive radial margin due to perforation. Biopsy proven recurrent rectal cancer, locally advanced disease, possible colovesical fistula; no distant metastatic disease on PET  I have discussed with his [name] Surgeon Dr. Luciano Cutter, recurrence is not resectable.  Recommend combined modality therapy with concurrent Xeloda 825 mg/m2 twice daily  with radiation Labs are reviewed and continue Xeloda 1500mg  BID on treatment days.  Follow up with Radonc for RT.   Imodum and antiemetics prescription sent to pharmacy

## 2023-01-13 NOTE — Assessment & Plan Note (Signed)
Follow up with ostomy care clinic

## 2023-01-13 NOTE — Progress Notes (Signed)
[facility]   Reason for visit:  LLQ colostomy  Difficulty obtaining supplies HPI:  Rectal cancer with resection and colostomy Past Medical History:  Diagnosis Date  . Headache   . Left shoulder pain   . Rectal cancer (HCC)    Family History  Problem Relation Age of Onset  . Cancer Sister   . Diabetes Mother   . Cancer Maternal Grandmother   . Cancer Paternal Grandmother    Allergies  Allergen Reactions  . Shellfish Allergy Swelling    Contrast Dye is OK    Current Outpatient Medications  Medication Sig Dispense Refill Last Dose  . capecitabine (XELODA) 500 MG tablet Take 3 tablets (1,500 mg total) by mouth 2 (two) times daily after a meal. Take Monday thru Friday. Take only on days of radiation. 150 tablet 0   . citalopram (CELEXA) 10 MG tablet Take 1 tablet (10 mg total) by mouth daily. 30 tablet 3   . feeding supplement (ENSURE ENLIVE / ENSURE PLUS) LIQD Take 237 mLs by mouth daily. (Patient not taking: Reported on 10/27/2022) 237 mL 12   . lidocaine-prilocaine (EMLA) cream Apply 1 Application topically as needed. Apply small amount to port and cover with saran wrap 1-2 hours prior to port access 30 g 2   . loperamide (IMODIUM) 2 MG capsule Take 2 capsules (4mg ) by mouth initially. Then take 1 capsule by mouth every 2 hours (4mg  every 4 hours at night). Take no more than 16mg /day (Patient not taking: Reported on 01/13/2023) 90 capsule 2   . nicotine (NICODERM CQ - DOSED IN MG/24 HR) 7 mg/24hr patch Place 1 patch (7 mg total) onto the skin daily. 28 patch 2   . ondansetron (ZOFRAN) 8 MG tablet Take 1 tablet (8 mg total) by mouth every 8 (eight) hours as needed for nausea or vomiting. (Patient not taking: Reported on 01/13/2023) 90 tablet 1   . prochlorperazine (COMPAZINE) 10 MG tablet Take 1 tablet (10 mg total) by mouth every 6 (six) hours as needed for nausea or vomiting. (Patient not taking: Reported on 01/13/2023) 90 tablet 1   . traZODone (DESYREL) 50 MG tablet Take 1  tablet (50 mg total) by mouth at bedtime as needed for sleep. (Patient not taking: Reported on 12/26/2022) 30 tablet 3    No current facility-administered medications for this encounter.   Facility-Administered Medications Ordered in Other Encounters  Medication Dose Route Frequency Provider Last Rate Last Admin  . heparin lock flush 100 unit/mL  500 Units Intravenous Once [name], MD       ROS  Review of Systems  Gastrointestinal:        LLQ colostomy  Skin:  Positive for color change.       Irritation around stoma  All other systems reviewed and are negative. Vital signs:  BP (!) 136/100 (BP Location: Right Arm)   Pulse 82   Temp 98.2 F (36.8 C) (Oral)   Resp 18   SpO2 98%  Exam:  Physical Exam Vitals reviewed.  Constitutional:      Appearance: Normal appearance.  Abdominal:     Palpations: Abdomen is soft.  Skin:    General: Skin is warm and dry.     Findings: Rash present.  Neurological:     Mental Status: He is alert and oriented to person, place, and time.  Psychiatric:        Mood and Affect: Mood normal.        Behavior: Behavior normal.  Stoma type/location:  LLQ colostomy Stomal assessment/size:  2" round pink and moist Peristomal assessment:  intact slightly budded Treatment options for stomal/peristomal skin: barrier ring and 2 piece pouch Output: thick brown stool Ostomy pouching: 2pc. 2 1/4" with barrier ring Education provided:  pouch change performed.  Fitted into an ostomy belt for added support    Impression/dx  Colostomy complication Set up for supplies Discussion  See back as needed Plan  Will set up with supplies    Visit time: 45 minutes.   Maple Hudson FNP-BC

## 2023-01-13 NOTE — Assessment & Plan Note (Signed)
Continue potassium supplementation 

## 2023-01-13 NOTE — Assessment & Plan Note (Signed)
Continue Nicotin patch to 7mg /24 hours

## 2023-01-14 ENCOUNTER — Other Ambulatory Visit: Payer: Self-pay

## 2023-01-14 ENCOUNTER — Ambulatory Visit
Admission: RE | Admit: 2023-01-14 | Discharge: 2023-01-14 | Disposition: A | Discharge status: Home / Self Care | Payer: 59 | Source: Ambulatory Visit | Attending: Radiation Oncology | Admitting: Radiation Oncology

## 2023-01-14 DIAGNOSIS — R911 Solitary pulmonary nodule: Secondary | ICD-10-CM | POA: Diagnosis not present

## 2023-01-14 DIAGNOSIS — C20 Malignant neoplasm of rectum: Secondary | ICD-10-CM | POA: Diagnosis not present

## 2023-01-14 DIAGNOSIS — Z510 Encounter for antineoplastic radiation therapy: Secondary | ICD-10-CM | POA: Diagnosis not present

## 2023-01-14 DIAGNOSIS — Z933 Colostomy status: Secondary | ICD-10-CM | POA: Diagnosis not present

## 2023-01-14 DIAGNOSIS — Z9221 Personal history of antineoplastic chemotherapy: Secondary | ICD-10-CM | POA: Diagnosis not present

## 2023-01-14 LAB — RAD ONC ARIA SESSION SUMMARY
Course Elapsed Days: 6
Plan Fractions Treated to Date: 5
Plan Prescribed Dose Per Fraction: 1.8 Gy
Plan Total Fractions Prescribed: 25
Plan Total Prescribed Dose: 45 Gy
Reference Point Dosage Given to Date: 9 Gy
Reference Point Session Dosage Given: 1.8 Gy
Session Number: 5

## 2023-01-15 ENCOUNTER — Ambulatory Visit
Admission: RE | Admit: 2023-01-15 | Discharge: 2023-01-15 | Disposition: A | Discharge status: Home / Self Care | Payer: 59 | Source: Ambulatory Visit | Attending: Radiation Oncology | Admitting: Radiation Oncology

## 2023-01-15 ENCOUNTER — Other Ambulatory Visit: Payer: Self-pay

## 2023-01-15 DIAGNOSIS — C20 Malignant neoplasm of rectum: Secondary | ICD-10-CM

## 2023-01-15 DIAGNOSIS — Z9221 Personal history of antineoplastic chemotherapy: Secondary | ICD-10-CM | POA: Diagnosis not present

## 2023-01-15 DIAGNOSIS — Z510 Encounter for antineoplastic radiation therapy: Secondary | ICD-10-CM | POA: Diagnosis not present

## 2023-01-15 DIAGNOSIS — R911 Solitary pulmonary nodule: Secondary | ICD-10-CM | POA: Diagnosis not present

## 2023-01-15 DIAGNOSIS — Z933 Colostomy status: Secondary | ICD-10-CM | POA: Diagnosis not present

## 2023-01-15 LAB — RAD ONC ARIA SESSION SUMMARY
Course Elapsed Days: 7
Plan Fractions Treated to Date: 6
Plan Prescribed Dose Per Fraction: 1.8 Gy
Plan Total Fractions Prescribed: 25
Plan Total Prescribed Dose: 45 Gy
Reference Point Dosage Given to Date: 10.8 Gy
Reference Point Session Dosage Given: 1.8 Gy
Session Number: 6

## 2023-01-16 ENCOUNTER — Other Ambulatory Visit: Payer: Self-pay

## 2023-01-16 ENCOUNTER — Encounter: Payer: Self-pay | Admitting: Oncology

## 2023-01-16 ENCOUNTER — Ambulatory Visit
Admission: RE | Admit: 2023-01-16 | Discharge: 2023-01-16 | Disposition: A | Discharge status: Home / Self Care | Payer: 59 | Source: Ambulatory Visit | Attending: Radiation Oncology | Admitting: Radiation Oncology

## 2023-01-16 DIAGNOSIS — Z510 Encounter for antineoplastic radiation therapy: Secondary | ICD-10-CM | POA: Diagnosis not present

## 2023-01-16 DIAGNOSIS — Z933 Colostomy status: Secondary | ICD-10-CM | POA: Diagnosis not present

## 2023-01-16 DIAGNOSIS — C20 Malignant neoplasm of rectum: Secondary | ICD-10-CM | POA: Diagnosis not present

## 2023-01-16 DIAGNOSIS — R911 Solitary pulmonary nodule: Secondary | ICD-10-CM | POA: Diagnosis not present

## 2023-01-16 DIAGNOSIS — Z9221 Personal history of antineoplastic chemotherapy: Secondary | ICD-10-CM | POA: Diagnosis not present

## 2023-01-16 LAB — RAD ONC ARIA SESSION SUMMARY
Course Elapsed Days: 8
Plan Fractions Treated to Date: 7
Plan Prescribed Dose Per Fraction: 1.8 Gy
Plan Total Fractions Prescribed: 25
Plan Total Prescribed Dose: 45 Gy
Reference Point Dosage Given to Date: 12.6 Gy
Reference Point Session Dosage Given: 1.8 Gy
Session Number: 7

## 2023-01-19 ENCOUNTER — Ambulatory Visit
Admission: RE | Admit: 2023-01-19 | Discharge: 2023-01-19 | Disposition: A | Discharge status: Home / Self Care | Payer: 59 | Source: Ambulatory Visit | Attending: Radiation Oncology | Admitting: Radiation Oncology

## 2023-01-19 ENCOUNTER — Other Ambulatory Visit: Payer: Self-pay

## 2023-01-19 DIAGNOSIS — L24B3 Irritant contact dermatitis related to fecal or urinary stoma or fistula: Secondary | ICD-10-CM | POA: Insufficient documentation

## 2023-01-19 DIAGNOSIS — K9400 Colostomy complication, unspecified: Secondary | ICD-10-CM | POA: Insufficient documentation

## 2023-01-19 DIAGNOSIS — Z9221 Personal history of antineoplastic chemotherapy: Secondary | ICD-10-CM | POA: Diagnosis not present

## 2023-01-19 DIAGNOSIS — C20 Malignant neoplasm of rectum: Secondary | ICD-10-CM | POA: Diagnosis not present

## 2023-01-19 DIAGNOSIS — Z933 Colostomy status: Secondary | ICD-10-CM | POA: Diagnosis not present

## 2023-01-19 DIAGNOSIS — R911 Solitary pulmonary nodule: Secondary | ICD-10-CM | POA: Diagnosis not present

## 2023-01-19 DIAGNOSIS — Z510 Encounter for antineoplastic radiation therapy: Secondary | ICD-10-CM | POA: Diagnosis not present

## 2023-01-19 LAB — RAD ONC ARIA SESSION SUMMARY
Course Elapsed Days: 11
Plan Fractions Treated to Date: 8
Plan Prescribed Dose Per Fraction: 1.8 Gy
Plan Total Fractions Prescribed: 25
Plan Total Prescribed Dose: 45 Gy
Reference Point Dosage Given to Date: 14.4 Gy
Reference Point Session Dosage Given: 1.8 Gy
Session Number: 8

## 2023-01-19 NOTE — Discharge Instructions (Signed)
Set up for supplies

## 2023-01-20 ENCOUNTER — Ambulatory Visit
Admission: RE | Admit: 2023-01-20 | Discharge: 2023-01-20 | Disposition: A | Discharge status: Home / Self Care | Payer: 59 | Source: Ambulatory Visit | Attending: Radiation Oncology | Admitting: Radiation Oncology

## 2023-01-20 ENCOUNTER — Other Ambulatory Visit: Payer: Self-pay

## 2023-01-20 DIAGNOSIS — R911 Solitary pulmonary nodule: Secondary | ICD-10-CM | POA: Diagnosis not present

## 2023-01-20 DIAGNOSIS — C20 Malignant neoplasm of rectum: Secondary | ICD-10-CM | POA: Diagnosis not present

## 2023-01-20 DIAGNOSIS — Z9221 Personal history of antineoplastic chemotherapy: Secondary | ICD-10-CM | POA: Diagnosis not present

## 2023-01-20 DIAGNOSIS — Z933 Colostomy status: Secondary | ICD-10-CM | POA: Diagnosis not present

## 2023-01-20 DIAGNOSIS — Z510 Encounter for antineoplastic radiation therapy: Secondary | ICD-10-CM | POA: Diagnosis not present

## 2023-01-20 LAB — RAD ONC ARIA SESSION SUMMARY
Course Elapsed Days: 12
Plan Fractions Treated to Date: 9
Plan Prescribed Dose Per Fraction: 1.8 Gy
Plan Total Fractions Prescribed: 25
Plan Total Prescribed Dose: 45 Gy
Reference Point Dosage Given to Date: 16.2 Gy
Reference Point Session Dosage Given: 1.8 Gy
Session Number: 9

## 2023-01-21 ENCOUNTER — Ambulatory Visit
Admission: RE | Admit: 2023-01-21 | Discharge: 2023-01-21 | Disposition: A | Discharge status: Home / Self Care | Payer: 59 | Source: Ambulatory Visit | Attending: Radiation Oncology | Admitting: Radiation Oncology

## 2023-01-21 ENCOUNTER — Other Ambulatory Visit: Payer: Self-pay

## 2023-01-21 DIAGNOSIS — C20 Malignant neoplasm of rectum: Secondary | ICD-10-CM | POA: Diagnosis not present

## 2023-01-21 DIAGNOSIS — R911 Solitary pulmonary nodule: Secondary | ICD-10-CM | POA: Diagnosis not present

## 2023-01-21 DIAGNOSIS — Z933 Colostomy status: Secondary | ICD-10-CM | POA: Diagnosis not present

## 2023-01-21 DIAGNOSIS — Z9221 Personal history of antineoplastic chemotherapy: Secondary | ICD-10-CM | POA: Diagnosis not present

## 2023-01-21 DIAGNOSIS — Z510 Encounter for antineoplastic radiation therapy: Secondary | ICD-10-CM | POA: Diagnosis not present

## 2023-01-21 LAB — RAD ONC ARIA SESSION SUMMARY
Course Elapsed Days: 13
Plan Fractions Treated to Date: 10
Plan Prescribed Dose Per Fraction: 1.8 Gy
Plan Total Fractions Prescribed: 25
Plan Total Prescribed Dose: 45 Gy
Reference Point Dosage Given to Date: 18 Gy
Reference Point Session Dosage Given: 1.8 Gy
Session Number: 10

## 2023-01-22 ENCOUNTER — Ambulatory Visit
Admission: RE | Admit: 2023-01-22 | Discharge: 2023-01-22 | Disposition: A | Discharge status: Home / Self Care | Payer: 59 | Source: Ambulatory Visit | Attending: Radiation Oncology | Admitting: Radiation Oncology

## 2023-01-22 ENCOUNTER — Other Ambulatory Visit: Payer: Self-pay

## 2023-01-22 ENCOUNTER — Other Ambulatory Visit: Payer: Self-pay | Admitting: *Deleted

## 2023-01-22 DIAGNOSIS — C20 Malignant neoplasm of rectum: Secondary | ICD-10-CM

## 2023-01-22 DIAGNOSIS — Z9221 Personal history of antineoplastic chemotherapy: Secondary | ICD-10-CM | POA: Diagnosis not present

## 2023-01-22 DIAGNOSIS — Z510 Encounter for antineoplastic radiation therapy: Secondary | ICD-10-CM | POA: Diagnosis not present

## 2023-01-22 DIAGNOSIS — Z933 Colostomy status: Secondary | ICD-10-CM | POA: Diagnosis not present

## 2023-01-22 DIAGNOSIS — R911 Solitary pulmonary nodule: Secondary | ICD-10-CM | POA: Diagnosis not present

## 2023-01-22 LAB — RAD ONC ARIA SESSION SUMMARY
Course Elapsed Days: 14
Plan Fractions Treated to Date: 11
Plan Prescribed Dose Per Fraction: 1.8 Gy
Plan Total Fractions Prescribed: 25
Plan Total Prescribed Dose: 45 Gy
Reference Point Dosage Given to Date: 19.8 Gy
Reference Point Session Dosage Given: 1.8 Gy
Session Number: 11

## 2023-01-22 NOTE — Telephone Encounter (Signed)
CBC with Differential (Cancer Center Only) Order: [id number] Status: Final result     Visible to patient: Yes (not seen)     Next appt: 01/23/2023 at 10:15 AM in Radiation Oncology [facility])     Dx: Rectal cancer (HCC)   0 Result Notes          Component Ref Range & Units 9 d ago 2 wk ago 1 mo ago 2 mo ago 3 mo ago 4 mo ago 7 mo ago  WBC Count 4.0 - 10.5 K/uL 5.4 5.3 CM 4.9 5.1 13.8 High  CM 6.5 11.4 High   RBC 4.22 - 5.81 MIL/uL 4.64 4.78 5.07 4.57 4.07 Low  4.42 4.64  Hemoglobin 13.0 - 17.0 g/dL 40.9 81.1 91.4 78.2 95.6 Low  12.5 Low  13.0  HCT 39.0 - 52.0 % 40.7 41.6 43.8 39.3 35.1 Low  37.0 Low  38.7 Low   MCV 80.0 - 100.0 fL 87.7 87.0 86.4 86.0 86.2 83.7 83.4  MCH 26.0 - 34.0 pg 30.0 30.3 29.4 28.7 28.5 28.3 28.0  MCHC 30.0 - 36.0 g/dL 21.3 08.6 57.8 46.9 62.9 33.8 33.6  RDW 11.5 - 15.5 % 16.1 High  15.9 High  16.5 High  18.5 High  18.3 High  16.3 High  16.2 High   Platelet Count 150 - 400 K/uL 196 183 206 361 298 529 High  208  Comment: SPECIMEN CHECKED FOR CLOTS  nRBC 0.0 - 0.2 % 0.0 0.0 0.0 CM 0.0 0.0 0.0 0.0  Neutrophils Relative % % 53 53  48 83 55 76  Neutro Abs 1.7 - 7.7 K/uL 2.9 2.8  2.4 11.2 High  3.7 8.7 High   Lymphocytes Relative % 29 31  34 9 24 10   Lymphs Abs 0.7 - 4.0 K/uL 1.6 1.7  1.8 1.3 1.6 1.1  Monocytes Relative % 13 12  13 8 14 13   Monocytes Absolute 0.1 - 1.0 K/uL 0.7 0.7  0.7 1.1 High  0.9 1.4 High   Eosinophils Relative % 4 3  4  0 3 1  Eosinophils Absolute 0.0 - 0.5 K/uL 0.2 0.2  0.2 0.1 0.2 0.1  Basophils Relative % 1 1  1  0 1 0  Basophils Absolute 0.0 - 0.1 K/uL 0.0 0.0  0.0 0.0 0.0 0.0  Immature Granulocytes % 0 0  0 0 3 0  Abs Immature Granulocytes 0.00 - 0.07 K/uL 0.02 0.01 CM  0.02 CM 0.04 CM 0.16 High  CM 0.04 CM  Comment: Performed at [facility], [address] [place], [place] [address]  WBC Morphology  DIFF. CONFIRMED BY SMEAR   MORPHOLOGY UNREMARKABLE  MORPHOLOGY UNREMARKABLE  RBC Morphology  MORPHOLOGY  UNREMARKABLE   MIXED RBC MORPHOLOGY  MORPHOLOGY UNREMARKABLE  Smear Review  Normal platelet morphology CM   Normal platelet morphology  Normal platelet morphology CM  Resulting Agency [facility]         Specimen Collected: 01/13/23 12:54 Last Resulted: 01/13/23 14:18      CMP (Cancer Center only) Order: [id number] Status: Final result     Visible to patient: Yes (not seen)     Next appt: 01/23/2023 at 10:15 AM in Radiation Oncology ([id number])     Dx: Rectal cancer (HCC)   0 Result Notes          Component Ref Range & Units 9 d ago (01/13/23) 2 wk ago (01/07/23) 2 mo  ago (10/27/22) 3 mo ago (10/13/22) 4 mo ago (09/04/22) 7 mo ago (06/26/22) 7 mo ago (06/25/22)  Sodium 135 - 145 mmol/L 136 136 139 133 Low  142 140 137  Potassium 3.5 - 5.1 mmol/L 3.6 3.5 3.8 3.8 4.1 3.4 Low  3.3 Low   Chloride 98 - 111 mmol/L 105 105 104 101 108 108 105  CO2 22 - 32 mmol/L 25 22 28 24 28 28 26   Glucose, Bld 70 - 99 mg/dL 161 High  096 High  CM 110 High  CM 94 CM 117 High  CM 94 CM 177 High  CM  Comment: Glucose reference range applies only to samples taken after fasting for at least 8 hours.  BUN 6 - 20 mg/dL 11 15 11 10 11 8 7   Creatinine 0.61 - 1.24 mg/dL 0.45 4.09 8.11 9.14 7.82 0.93 0.99  Calcium 8.9 - 10.3 mg/dL 8.8 Low  8.6 Low  8.8 Low  8.2 Low  9.0 8.4 Low  8.6 Low   Total Protein 6.5 - 8.1 g/dL 7.5 7.3 8.2 High  8.0 8.3 High  7.8 7.9  Albumin 3.5 - 5.0 g/dL 3.7 3.5 3.6 3.6 3.2 Low  3.2 Low  3.4 Low   AST 15 - 41 U/L 23 34 20 20 19 14  Low  18  ALT 0 - 44 U/L 23 22 16 14 19 10 11   Alkaline Phosphatase 38 - 126 U/L 77 96 87 87 80 96 89  Total Bilirubin 0.3 - 1.2 mg/dL 0.4 0.3 0.4 0.6 0.3 0.9 0.6  GFR, Estimated >60 mL/min >60 >60 CM       Comment: (NOTE) Calculated using the CKD-EPI Creatinine Equation (2021)  Anion gap 5 - 15 6 9  CM 7 CM 8 CM 6 CM 4 Low  CM 6 CM  Comment: Performed at [facility], [address] [place], [place] [address]  Resulting Agency [facility]         Specimen Collected: 01/13/23 12:54 Last Resulted: 01/13/23 13:36

## 2023-01-23 ENCOUNTER — Other Ambulatory Visit: Payer: Self-pay

## 2023-01-23 ENCOUNTER — Encounter: Payer: Self-pay | Admitting: Oncology

## 2023-01-23 ENCOUNTER — Inpatient Hospital Stay: Payer: 59

## 2023-01-23 ENCOUNTER — Ambulatory Visit
Admission: RE | Admit: 2023-01-23 | Discharge: 2023-01-23 | Disposition: A | Discharge status: Home / Self Care | Payer: 59 | Source: Ambulatory Visit | Attending: Radiation Oncology | Admitting: Radiation Oncology

## 2023-01-23 ENCOUNTER — Inpatient Hospital Stay (HOSPITAL_BASED_OUTPATIENT_CLINIC_OR_DEPARTMENT_OTHER): Payer: 59 | Admitting: Oncology

## 2023-01-23 VITALS — BP 127/93 | HR 71 | Temp 96.1°F | Resp 18 | Wt 167.1 lb

## 2023-01-23 DIAGNOSIS — R911 Solitary pulmonary nodule: Secondary | ICD-10-CM | POA: Diagnosis not present

## 2023-01-23 DIAGNOSIS — Z5111 Encounter for antineoplastic chemotherapy: Secondary | ICD-10-CM | POA: Diagnosis not present

## 2023-01-23 DIAGNOSIS — Z510 Encounter for antineoplastic radiation therapy: Secondary | ICD-10-CM | POA: Diagnosis not present

## 2023-01-23 DIAGNOSIS — Z9221 Personal history of antineoplastic chemotherapy: Secondary | ICD-10-CM | POA: Diagnosis not present

## 2023-01-23 DIAGNOSIS — C20 Malignant neoplasm of rectum: Secondary | ICD-10-CM | POA: Diagnosis not present

## 2023-01-23 DIAGNOSIS — E876 Hypokalemia: Secondary | ICD-10-CM

## 2023-01-23 DIAGNOSIS — Z933 Colostomy status: Secondary | ICD-10-CM | POA: Diagnosis not present

## 2023-01-23 LAB — CBC WITH DIFFERENTIAL (CANCER CENTER ONLY)
Abs Immature Granulocytes: 0.01 10*3/uL (ref 0.00–0.07)
Basophils Absolute: 0 10*3/uL (ref 0.0–0.1)
Basophils Relative: 1 %
Eosinophils Absolute: 0.3 10*3/uL (ref 0.0–0.5)
Eosinophils Relative: 7 %
HCT: 40.4 % (ref 39.0–52.0)
Hemoglobin: 13.9 g/dL (ref 13.0–17.0)
Immature Granulocytes: 0 %
Lymphocytes Relative: 27 %
Lymphs Abs: 1.2 10*3/uL (ref 0.7–4.0)
MCH: 30 pg (ref 26.0–34.0)
MCHC: 34.4 g/dL (ref 30.0–36.0)
MCV: 87.1 fL (ref 80.0–100.0)
Monocytes Absolute: 0.6 10*3/uL (ref 0.1–1.0)
Monocytes Relative: 13 %
Neutro Abs: 2.2 10*3/uL (ref 1.7–7.7)
Neutrophils Relative %: 52 %
Platelet Count: 199 10*3/uL (ref 150–400)
RBC: 4.64 MIL/uL (ref 4.22–5.81)
RDW: 15.6 % — ABNORMAL HIGH (ref 11.5–15.5)
Smear Review: NORMAL
WBC Count: 4.3 10*3/uL (ref 4.0–10.5)
nRBC: 0 % (ref 0.0–0.2)

## 2023-01-23 LAB — RAD ONC ARIA SESSION SUMMARY
Course Elapsed Days: 15
Plan Fractions Treated to Date: 12
Plan Prescribed Dose Per Fraction: 1.8 Gy
Plan Total Fractions Prescribed: 25
Plan Total Prescribed Dose: 45 Gy
Reference Point Dosage Given to Date: 21.6 Gy
Reference Point Session Dosage Given: 1.8 Gy
Session Number: 12

## 2023-01-23 LAB — CMP (CANCER CENTER ONLY)
ALT: 19 U/L (ref 0–44)
AST: 23 U/L (ref 15–41)
Albumin: 3.6 g/dL (ref 3.5–5.0)
Alkaline Phosphatase: 71 U/L (ref 38–126)
Anion gap: 10 (ref 5–15)
BUN: 11 mg/dL (ref 6–20)
CO2: 23 mmol/L (ref 22–32)
Calcium: 8.4 mg/dL — ABNORMAL LOW (ref 8.9–10.3)
Chloride: 104 mmol/L (ref 98–111)
Creatinine: 1.07 mg/dL (ref 0.61–1.24)
GFR, Estimated: >60 mL/min (ref >60)
Glucose, Bld: 165 mg/dL — ABNORMAL HIGH (ref 70–99)
Potassium: 3.6 mmol/L (ref 3.5–5.1)
Sodium: 137 mmol/L (ref 135–145)
Total Bilirubin: 0.4 mg/dL (ref 0.3–1.2)
Total Protein: 7.5 g/dL (ref 6.5–8.1)

## 2023-01-23 MED ORDER — HEPARIN SOD (PORK) LOCK FLUSH 100 UNIT/ML IV SOLN
500.0000 [IU] | Freq: Once | INTRAVENOUS | Status: AC
  Administered 2023-01-23: 500 [IU] via INTRAVENOUS
  Filled 2023-01-23: qty 5

## 2023-01-23 MED ORDER — SODIUM CHLORIDE 0.9% FLUSH
10.0000 mL | Freq: Once | INTRAVENOUS | Status: AC
  Administered 2023-01-23: 10 mL via INTRAVENOUS
  Filled 2023-01-23: qty 10

## 2023-01-23 NOTE — Assessment & Plan Note (Signed)
locally advanced rectal cancer-Stage IIIB, s/p TNT neoadjuvant protocol, concurrent Xeloda and radiation-Finished 08/27/2021, s/p  APR,rectal adenocarcinoma, ypT3N0. Positive radial margin due to perforation. Biopsy proven recurrent rectal cancer, locally advanced disease, possible colovesical fistula; no distant metastatic disease on PET  I have discussed with his [name] Surgeon Dr. Mantyh, recurrence is not resectable.  Recommend combined modality therapy with concurrent Xeloda 825 mg/m2 twice daily  with radiation Labs are reviewed and continue Xeloda 1500mg BID on treatment days.  Follow up with Radonc for RT.   Imodum and antiemetics prescription sent to pharmacy 

## 2023-01-23 NOTE — Progress Notes (Signed)
Hematology/Oncology Progress note [phone]) [phone] [phone]) [phone]      CHIEF COMPLAINTS/REASON FOR VISIT:  Follow up for recurrent rectal cancer treatment  ASSESSMENT & PLAN:   Cancer Staging  Rectal cancer [facility]) Staging form: Colon and Rectum, AJCC 8th Edition - Clinical stage from 02/08/2021: Stage IIIB (cT4a, cN1, cM0) - Signed by [name], MD on 03/06/2021   Rectal cancer [facility]) locally advanced rectal cancer-Stage IIIB, s/p TNT neoadjuvant protocol, concurrent Xeloda and radiation-Finished 08/27/2021, s/p  APR,rectal adenocarcinoma, ypT3N0. Positive radial margin due to perforation. Biopsy proven recurrent rectal cancer, locally advanced disease, possible colovesical fistula; no distant metastatic disease on PET  I have discussed with his [name] Surgeon Dr. Luciano Cutter, recurrence is not resectable.  Recommend combined modality therapy with concurrent Xeloda 825 mg/m2 twice daily  with radiation Labs are reviewed and continue Xeloda 1500mg  BID on treatment days.  Follow up with Radonc for RT.   Imodum and antiemetics prescription sent to pharmacy  Lung nodule Previously discussed with IR, CT-guided biopsy is difficult due to the position of small size. Negative on PET evaluation.  Attention on follow-up CT scans.   Hypokalemia Continue potassium supplementation   Encounter for antineoplastic chemotherapy Chemotherapy plan as listed above    Orders Placed This Encounter  Procedures   CMP (Cancer Center only)    Standing Status:   Future    Standing Expiration Date:   01/23/2024   CBC with Differential (Cancer Center Only)    Standing Status:   Future    Standing Expiration Date:   01/23/2024    Follow-up 2 weeks All questions were answered. The patient knows to call the clinic with any problems, questions or concerns.  [name], MD, PhD [facility] Hematology Oncology 01/23/2023        HISTORY OF PRESENTING ILLNESS:   Barry Denzel. is a  57  y.o.  male presents for rectal cancer Oncology History  Rectal cancer (HCC)  02/08/2021 Initial Diagnosis   Rectal cancer   02/05/2021-02/06/2021 patient was hospitalized due to generalized weakness, intermittent lightheadedness, weight loss and worsening constipation.  02/05/2021 CT abdomen showed concerning of severe rectal wall thickening and right internal iliac lymph node concerning for metastatic disease.  Patient was seen by gastroenterology and had colonoscopy which showed a circumferential fungating mass in the rectum.  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   02/14/2021-02/16/2021 hospitalized due to rectal bleeding and pain.   02/14/2021 CT showed perirectal fluid collection/gas concerning for infection with significant leukocytosis, anemia with hemoglobin of 6.8.  Patient received PRBC transfusion, IV antibiotics.  He underwent an IR guided placement of JP drain into the rectal abscess.  Discharged home with oral Augmentin. 02/20/2021 presented to ER with new skin opening and draining from left gluteus.  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  JP drain has been removed.  Patient was continued on Augmentin.   02/13/2021, PET scan showed locally advanced rectal cancer with billowing of the mesorectum now with low attenuation material that was not present on previous examination with extensive stranding and inflammation.   Bulky RIGHT pelvic sidewall/hypogastric lymph node outside of the mesorectum with stippled calcification measuring 19 mm-no increased metabolic activity. Small LEFT hypogastric lymph node just peripheral to the internal external bifurcation-SUV 3.2 High RIGHT internal just below or at the internal/common iliac transition, lymph node -SUV 4.8 LEFT high hypogastric lymph node  8 mm-SUV 3. Scattered lymph nodes throughout the retroperitoneum with low FDG uptake Bilateral inguinal lymph nodes  largest on the RIGHT (image 248/3) 11 mm with a maximum SUV of  2.8 spiculated nodule in the LEFT upper lobe- 9 x 8 mm       02/14/2021, CT abdomen pelvis without contrast Showed perforated rectal mass with contained perforation with fluid and gas extending above and below the pelvic floor,potentially involving the sphincter complex and extending into LEFT ischial rectal fossa   02/20/2021, CT pelvis with contrast showed Large perirectal/perianal abscess has markedly decreased in size since placement of the percutaneous drain. There are residual gas-filled collections in the soft tissues and suspect there is a fistula or sinus tract between the rectal mass and the subcutaneous tissues. Soft tissue gas along the medial left buttock and concern for a cutaneous ulceration in this area. Large rectal mass with evidence for a large necrotic right pelvic lymph node.   His case is complicated with a perirectal abscess. Prior to onset of perirectal abscess, on his 02/05/2021 scan, he was noted to have right internal iliac lymph node 3 x 1 x 2.3 cm which is concerning for nodal disease.On Subsequent images it was difficult to distinguish whether lymphadenopathy was due to nodal disease versus acute inflammation. 03/07/2021 MRI pelvis cT3 N2. Due to the possible contained perforation on previous CT, possible cT4 disease.    02/27/2021 medi port placed by Dr.Dew 03/13/2021 reports right butt cheek and also left perianal area fullness.he was seen by Dr.Pabon urgently  and had right buttock abscess drained. Left perianal fullness was felt to be due to cancer.    02/08/2021 Cancer Staging   Staging form: Colon and Rectum, AJCC 8th Edition - Clinical stage from 02/08/2021: Stage IIIB (cT4a, cN1, cM0) - Signed by [name], MD on 03/06/2021 Stage prefix: Initial diagnosis   02/27/2021 Procedure   medi port placed by Dr.Dew   03/18/2021 - 07/05/2021 Chemotherapy    FOLFOX q14d x 4 months      07/17/2021 - 08/27/2021 Chemotherapy   Xeloda concurrent with Radiation   11/18/2021 Surgery    patient is status post open APR with flap for rectal cancer. Pathology rectal adenomacarcinoma ypT3N0.  Positive margin: Radial (circumferential) or mesenteric: adjacent to perforation      01/27/2022 - 02/02/2022 Hospital Admission   Hospitalized at [facility] due to recurrent abscess.  US guided drain into the pelvic abscess with return of 60 ml of purulent fluid. Cultures positive for staph aureus and strep agalactiae group b, fungal cultures negative.    02/25/2022 Imaging   CT chest angiogram with and without contrast, CT abdomen pelvis with and without contrast 1. Negative for acute pulmonary embolus.2. Emphysema. Further decrease in size of the previously noted irregular nodule in the left upper lobe which is now barely measurable today. No new suspicious lung nodules 3. Status post left lower quadrant colostomy. Further decrease in size since comparison exam from May of the rim enhancing gas and fluid collection at the pelvic surgical bed extending from the perineum superiorly into the pelvis 4. Slightly thickened appearance of terminal ileal small bowel loops in the pelvis with mild stranding suggesting small bowel inflammatory process. 5. Stable enlarged right pelvic sidewall lymph node   06/02/2022 Imaging   CT chest abdomen pelvis w contrast 1. Status post abdominal perineal resection with descending colostomy. 2. At the site of pelvic fluid and gas collection on 02/25/2022, there is residual, decreased presacral soft tissue fullness, without drainable collection. 3. Similar right obturator nodal metastasis.4.  No acute process or evidence of metastatic disease  in the chest. 5. Age advanced coronary artery atherosclerosis. Recommend assessment of coronary risk factors. 6. Aortic atherosclerosis and emphysema     06/26/2022 Imaging   CT abdomen pelvis with contrast showed 1. Interval increase in size of a lobulated partially imaged at least 7.8 x 3.8 x 12 cm abscess in the perianal  region in a patient status post abdominal perineal resection and left lower lobe end colostomy formation. Finding extends to involve the pre sacral region up to the urinary dome level. Associated posterior urinary bladder wall thickening with lack of intraperitoneal fat plane between the presacral soft tissue thickening/abscess formation suggestive of possible fistulization and invasion of the posterior bladder wall. Underlying recurrent malignancy is not excluded. 2. Stable 2.7 cm right pelvic sidewall lymph node. 3.  Aortic Atherosclerosis   06/26/2022 - 06/27/2022 Hospital Admission   He went to [facility] ER and CT showed recurrent abscess. He was transferred to [facility] due to recurrent abscess, treated with IV vanc/zosyn, IR was consulted and drainage tube was placed. Wound grew up Strep viridans. Patient was seen by ID at [facility] discharged with Augmentin for 2 weeks.   He was seen by wound care Dr> Kandace Blitz on 07/09/2022, JP drain was removed.    09/29/2022 Imaging   MRI pelvis without contrast showed  Interval abdominoperitoneal resection since prior MRI. Decreased small fluid collection in the surgical bed compared with more recent CT, consistent with resolving postoperative fluid collection or abscess.   Bulky rounded presacral mass, which shows diffuse restricted diffusion, without significant change in size since most recent CT of 06/26/2022. This raises suspicion for recurrent carcinoma over post treatment changes. Recommend correlation with CEA level, and consider tissue sampling or PET-CT.   Stable enlarged right pelvic sidewall lymph node. No new or increased adenopathy identified.   10/09/2022 Imaging   CT chest with contrast showed 1. Unchanged 0.4 cm fissural nodule of the anterior left lower lobe. This is almost certainly a benign fissural lymph node. No new or suspicious pulmonary nodules. 2. Moderate emphysema and diffuse bilateral bronchial wall thickening. 3. Coronary artery  disease.   10/13/2022 Imaging   CT abdomen pelvis with contrast showed 1. Complex collection persists in the lower pelvis, extending from the anal verge upwards into the presacral space and anteriorly from the presacral space to the bladder dome, not significantly changed in size or extent compared to the earlier CT of 06/26/2022. This is most likely a combination of a postoperative seroma and/or chronic phlegmon/abscess versus recurrent abscess. 2. Bladder walls are thick walled/edematous. Given the contiguity of the presacral collection and the bladder dome, this is highly suspicious for a related bladder wall infection and possibly secondary to colovesical fistula. Recommend correlation with urinalysis. 3. No evidence of bowel obstruction. LEFT lower abdominal wall colostomy, without obstruction or inflammatory change. 4. No free intraperitoneal air   10/13/2022 [facility] Admission   Hospitalized due to fever and rectal pain. Urine culture showed mixed urogenital flora. IR CT guided Aspiration of abscess showed Streptococcal Viridans. He was placed back on Augmentin  10/14/2023 CT read by [facility] radiology Postsurgical changes of abdominoperineal resection. Interval enlargement of  fluid collection in the surgical bed. Multiple foci of enhancing tissue at  the margins of this fluid collection, increased from prior study, suspicious for local recurrence.   Prominent retroperitoneal lymph nodes, some which are increased in size from prior study. These are indeterminate and may be either reactive or represent metastatic disease. Recommend continued attention on follow-up.  Centrally hypoattenuating right obturator lymph node, unchanged in size, consistent with treated disease.   Circumferential bladder wall thickening and irregularity, most pronounced on the left side. This may be reactive in the setting of adjacent pelvic inflammatory changes. Correlate with urinalysis if there is clinical  concern for  urinary tract infection.    11/14/2022 Imaging   MRI pelvis w wo contrast  1. Status post abdominoperineal resection with left lower quadrant end colostomy. 2. Compared to prior CT and MR, no significant change in appearance of heterogeneous, rim enhancing presacral and low pelvic soft tissue and fluid. Largest heterogeneously enhancing conglomerate of fluid and soft tissue appears to closely involve the anteriorly abutting the seminal vesicles and measures 3.0 x 2.9 cm in largest axial dimension. This extends to superiorly contact the posterior bladder dome and inferiorly towards the gluteal cleft. 3. Discrete fluid component at the most inferior extent measuring 2.4 x 1.3 cm. This is markedly diminished in volume compared to more remote previous examinations, for example 06/26/2022. 4. Constellation of findings is highly concerning for locally recurrent rectal malignancy with or without superimposed infection. Presence or absence of infection at this time is not established by MR. 5. Additional unchanged hemorrhagic or proteinaceous fluid collection in the right hemipelvis measuring 3.2 x 2.6 cm most consistent with postoperative hematoma or seroma. 6. Unchanged thickening of the urinary bladder wall. As previously reported, fistula or involvement of bladder wall by malignancy not excluded.   12/09/2022 Procedure   CT guided biopsy of the pelvic mass showed Moderately differentiated adenocarcinoma with dirty necrosis, morphologically identical to patient's prior rectal adenocarcinoma.   [facility] NGS xT 648 panel showed MLH3 start loss,  BCORL1 frame shift, TP53 splice region varian, APC stop gain,  TMB 7.9, MS stable, KRAS/BRAF/NRAS negative.   [facility] NGS xR showed no gene arrangement or reportable altered splicing events in RNA sequencing      01/07/2023 -  Chemotherapy   Xeloda 825 mg/m2 twice daily  with radiation     Patient presented to emergency room on 12/08/21, rectal pain.  CT scan  showed 18.3 cm fluid and gas collection in the pelvic surgical bed compatible with infected collection/abscess.  Patient was sent to [facility] and admitted for.  Patient has CT-guided drain placement on 12/09/2021.  Blood culture positive for Staph epidermidis which was felt to be contaminant.  Fluid culture grew pansensitive Staph aureus, strep pyogenes and candida albicans.  Patient was treated with IV antibiotics and transition to p.o. Augmentin and fluconazole on 12/11/2021.  #Perirectal abscess status post drainage catheter , patient was recently seen by [facility] oncology surgeon on 12/24/2021 and was recommended to keep drainage catheter and continue antibiotics. Patient had a repeat CT scan done which showed a smaller but persistent abscess.  He will return to [facility] surgery for follow-up of drain removal. His case was discussed on [name] tumor board for the positive mesenteric margin which was felt to be secondary to previous perforation.  Recommend surveillance.  INTERVAL HISTORY Barry Cuartas. is a 57 y.o. male who has above history reviewed by me today presents for follow up visit for management of recurrent locally advanced rectal cancer. +intermittent dysuria, Denies fever chills, rectal pain. Denies rectal discharge.  Denies nausea vomiting or increased loose bowel movement in the colostomy bag.  He tolerates concurrent chemotherapy with radiation well. No new complaints.    Review of Systems  Constitutional:  Negative for appetite change, chills, fatigue, fever and unexpected weight change.  HENT:  Negative for hearing loss and voice change.   Eyes:  Negative for eye problems and icterus.  Respiratory:  Negative for chest tightness, cough and shortness of breath.   Cardiovascular:  Negative for chest pain and leg swelling.  Gastrointestinal:  Negative for abdominal distention, abdominal pain, blood in stool, constipation, diarrhea and rectal pain.       Very occasional rectal drainage   Endocrine: Negative for hot flashes.  Genitourinary:  Negative for difficulty urinating, dysuria and frequency.   Musculoskeletal:  Negative for arthralgias.  Skin:  Negative for itching and rash.  Neurological:  Negative for light-headedness and numbness.  Hematological:  Negative for adenopathy. Does not bruise/bleed easily.  Psychiatric/Behavioral:  Negative for confusion.     MEDICAL HISTORY:  Past Medical History:  Diagnosis Date   Headache    Left shoulder pain    Rectal cancer (HCC)     SURGICAL HISTORY: Past Surgical History:  Procedure Laterality Date   COLON SURGERY     COLONOSCOPY WITH PROPOFOL N/A 02/06/2021   Procedure: COLONOSCOPY WITH PROPOFOL;  Surgeon: Toney Reil, MD;  Location: [facility] ENDOSCOPY;  Service: Gastroenterology;  Laterality: N/A;   PORTA CATH INSERTION N/A 02/27/2021   Procedure: PORTA CATH INSERTION;  Surgeon: Annice Needy, MD;  Location: [facility] INVASIVE CV LAB;  Service: Cardiovascular;  Laterality: N/A;   ROTATOR CUFF REPAIR Left     SOCIAL HISTORY: Social History   Socioeconomic History   Marital status: Married    Spouse name: Not on file   Number of children: Not on file   Years of education: Not on file   Highest education level: Not on file  Occupational History   Not on file  Tobacco Use   Smoking status: Former    Packs/day: 1.00    Years: 10.00    Additional pack years: 0.00    Total pack years: 10.00    Types: Cigars, Cigarettes    Quit date: 02/04/2021    Years since quitting: 1.9   Smokeless tobacco: Never  Substance and Sexual Activity   Alcohol use: Not Currently   Drug use: No   Sexual activity: Not on file  Other Topics Concern   Not on file  Social History Narrative   Not on file   Social Determinants of Health   Financial Resource Strain: Medium Risk (11/03/2022)   Overall Financial Resource Strain (CARDIA)    Difficulty of Paying Living Expenses: Somewhat hard  Food Insecurity: Food Insecurity  Present (11/03/2022)   Hunger Vital Sign    Worried About Running Out of Food in the Last Year: Sometimes true    Ran Out of Food in the Last Year: Sometimes true  Transportation Needs: No Transportation Needs (11/03/2022)   PRAPARE - Administrator, Civil Service (Medical): No    Lack of Transportation (Non-Medical): No  Physical Activity: Inactive (11/03/2022)   Exercise Vital Sign    Days of Exercise per Week: 0 days    Minutes of Exercise per Session: 0 min  Stress: Stress Concern Present (11/03/2022)   [employer] of Occupational Health - Occupational Stress Questionnaire    Feeling of Stress : Rather much  Social Connections: Moderately Isolated (11/03/2022)   Social Connection and Isolation Panel [NHANES]    Frequency of Communication with Friends and Family: Twice a week    Frequency of Social Gatherings with Friends and Family: Three times a week    Attends Religious Services: 1 to 4 times per year  Active Member of Clubs or Organizations: No    Attends Banker Meetings: Never    Marital Status: Separated  Intimate Partner Violence: Not At Risk (11/03/2022)   Humiliation, Afraid, Rape, and Kick questionnaire    Fear of Current or Ex-Partner: No    Emotionally Abused: No    Physically Abused: No    Sexually Abused: No    FAMILY HISTORY: Family History  Problem Relation Age of Onset   Cancer Sister    Diabetes Mother    Cancer Maternal Grandmother    Cancer Paternal Grandmother     ALLERGIES:  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  MEDICATIONS:  Current Outpatient Medications  Medication Sig Dispense Refill   capecitabine (XELODA) 500 MG tablet Take 3 tablets (1,500 mg total) by mouth 2 (two) times daily after a meal. Take Monday thru Friday. Take only on days of radiation. 150 tablet 0   citalopram (CELEXA) 10 MG tablet Take 1 tablet (10 mg total) by mouth daily. 30 tablet 3   lidocaine-prilocaine (EMLA) cream Apply 1 Application topically  as needed. Apply small amount to port and cover with saran wrap 1-2 hours prior to port access 30 g 2   nicotine (NICODERM CQ - DOSED IN MG/24 HR) 7 mg/24hr patch Place 1 patch (7 mg total) onto the skin daily. 28 patch 2   traZODone (DESYREL) 50 MG tablet Take 1 tablet (50 mg total) by mouth at bedtime as needed for sleep. 30 tablet 3   feeding supplement (ENSURE ENLIVE / ENSURE PLUS) LIQD Take 237 mLs by mouth daily. (Patient not taking: Reported on 10/27/2022) 237 mL 12   loperamide (IMODIUM) 2 MG capsule Take 2 capsules (4mg ) by mouth initially. Then take 1 capsule by mouth every 2 hours (4mg  every 4 hours at night). Take no more than 16mg /day (Patient not taking: Reported on 01/13/2023) 90 capsule 2   ondansetron (ZOFRAN) 8 MG tablet Take 1 tablet (8 mg total) by mouth every 8 (eight) hours as needed for nausea or vomiting. (Patient not taking: Reported on 01/13/2023) 90 tablet 1   prochlorperazine (COMPAZINE) 10 MG tablet Take 1 tablet (10 mg total) by mouth every 6 (six) hours as needed for nausea or vomiting. (Patient not taking: Reported on 01/13/2023) 90 tablet 1   No current facility-administered medications for this visit.   Facility-Administered Medications Ordered in Other Visits  Medication Dose Route Frequency Provider Last Rate Last Admin   heparin lock flush 100 unit/mL  500 Units Intravenous Once [name], MD         PHYSICAL EXAMINATION: ECOG PERFORMANCE STATUS: 1 - Symptomatic but completely ambulatory Vitals:   01/23/23 1054  BP: (!) 127/93  Pulse: 71  Resp: 18  Temp: (!) 96.1 F (35.6 C)     Filed Weights   01/23/23 1054  Weight: 167 lb 1.6 oz (75.8 kg)      Physical Exam HENT:     Head: Normocephalic and atraumatic.  Eyes:     General: No scleral icterus. Cardiovascular:     Rate and Rhythm: Normal rate and regular rhythm.     Heart sounds: Normal heart sounds.  Pulmonary:     Effort: Pulmonary effort is normal. No respiratory distress.     Breath sounds:  No wheezing.     Comments: Decreased breath sound bilaterally.  Abdominal:     General: Bowel sounds are normal. There is no distension.     Palpations: Abdomen is soft.  Comments: Colostomy bag  Genitourinary:    Comments: Status post APR, Musculoskeletal:        General: No deformity. Normal range of motion.     Cervical back: Normal range of motion and neck supple.  Skin:    General: Skin is warm and dry.     Findings: No erythema or rash.  Neurological:     Mental Status: He is alert and oriented to person, place, and time. Mental status is at baseline.     Cranial Nerves: No cranial nerve deficit.     Coordination: Coordination normal.  Psychiatric:        Mood and Affect: Mood normal.     LABORATORY DATA:  I have reviewed the data as listed    Latest Ref Rng & Units 01/23/2023   10:16 AM 01/13/2023   12:54 PM 01/07/2023    8:56 AM  CBC  WBC 4.0 - 10.5 K/uL 4.3  5.4  5.3   Hemoglobin 13.0 - 17.0 g/dL 09.8  11.9  14.7   Hematocrit 39.0 - 52.0 % 40.4  40.7  41.6   Platelets 150 - 400 K/uL 199  196  183       Latest Ref Rng & Units 01/23/2023   10:16 AM 01/13/2023   12:54 PM 01/07/2023    8:55 AM  CMP  Glucose 70 - 99 mg/dL 829  562  130   BUN 6 - 20 mg/dL 11  11  15    Creatinine 0.61 - 1.24 mg/dL 8.65  7.84  6.96   Sodium 135 - 145 mmol/L 137  136  136   Potassium 3.5 - 5.1 mmol/L 3.6  3.6  3.5   Chloride 98 - 111 mmol/L 104  105  105   CO2 22 - 32 mmol/L 23  25  22    Calcium 8.9 - 10.3 mg/dL 8.4  8.8  8.6   Total Protein 6.5 - 8.1 g/dL 7.5  7.5  7.3   Total Bilirubin 0.3 - 1.2 mg/dL 0.4  0.4  0.3   Alkaline Phos 38 - 126 U/L 71  77  96   AST 15 - 41 U/L 23  23  34   ALT 0 - 44 U/L 19  23  22       RADIOGRAPHIC STUDIES: I have personally reviewed the radiological images as listed and agreed with the findings in the report. NM PET Image Restage (PS) Skull Base to Thigh (F-18 FDG)  Result Date: 12/18/2022 CLINICAL DATA:  Initial treatment strategy for rectal  cancer. EXAM: NUCLEAR MEDICINE PET SKULL BASE TO THIGH TECHNIQUE: 8.9 mCi F-18 FDG was injected intravenously. Full-ring PET imaging was performed from the skull base to thigh after the radiotracer. CT data was obtained and used for attenuation correction and anatomic localization. Fasting blood glucose: 85 mg/dl COMPARISON:  MR pelvis 11/14/2022, CT abdomen pelvis 10/13/2022, CT chest 10/09/2022. FINDINGS: Mediastinal blood pool activity: SUV max 2.3 Liver activity: SUV max NA NECK: No hypermetabolic lymph nodes. Incidental CT findings: None. CHEST: No abnormal hypermetabolism. Incidental CT findings: Right IJ Port-A-Cath terminates near the SVC RA junction. Atherosclerotic calcification of the aorta. Heart size within normal limits. No pericardial or pleural effusion. Centrilobular emphysema. ABDOMEN/PELVIS: Extensive abnormal hypermetabolism associated with soft tissue thickening/mass in the presacral space, status post low anterior resection with SUV max up to 16.1 inferiorly. Given the amorphous nature of the soft tissue, measurement is challenging. Rough axial dimensions are 4.0 x 5.6 cm (4/143). No additional abnormal hypermetabolism.  Incidental CT findings: Probable cyst in the posterior right hepatic lobe. Liver, gallbladder and adrenal glands are otherwise unremarkable. Subcentimeter low-attenuation lesion in the right kidney, too small to characterize. No specific follow-up necessary. Visualized portions of the left kidney, spleen, pancreas and stomach are otherwise unremarkable. Left lower quadrant colostomy. SKELETON: No abnormal hypermetabolism. Incidental CT findings: Degenerative changes in the spine. IMPRESSION: 1. FDG avid soft tissue thickening/mass in the surgical bed, status post low anterior section, indicative of disease recurrence. No evidence of metastatic disease. 2.  Aortic atherosclerosis (ICD10-I70.0). 3.  Emphysema (ICD10-J43.9). Electronically Signed   By: Leanna Battles M.D.   On:  12/18/2022 13:31   CT ABDOMINAL MASS BIOPSY  Result Date: 12/09/2022 INDICATION: 57 year old male referred for biopsy of pelvic mass EXAM: CT BIOPSY MEDICATIONS: None. ANESTHESIA/SEDATION: Moderate (conscious) sedation was employed during this procedure. A total of Versed 2.0 mg and Fentanyl 100 mcg was administered intravenously. Moderate Sedation Time: 20 minutes. The patient's level of consciousness and vital signs were monitored continuously by radiology nursing throughout the procedure under my direct supervision. FLUOROSCOPY TIME:  CT COMPLICATIONS: None PROCEDURE: Informed written consent was obtained from the patient after a thorough discussion of the procedural risks, benefits and alternatives. All questions were addressed. Maximal Sterile Barrier Technique was utilized including caps, mask, sterile gowns, sterile gloves, sterile drape, hand hygiene and skin antiseptic. A timeout was performed prior to the initiation of the procedure. The procedure risks, benefits, and alternatives were explained to the patient. Questions regarding the procedure were encouraged and answered. The patient understands and consents to the procedure. Patient was position prone on the CT gantry table. Scout CT of the pelvis was performed for surgical planning purposes. The posterior pelvis was prepped with Chlorhexidine in a sterile fashion, and a sterile drape was applied covering the operative field. A sterile gown and sterile gloves were used for the procedure. Local anesthesia was provided with 1% Lidocaine. Using CT guidance, introducer needle was advanced into the soft tissue mass at the level of the coccyx. Once we confirmed needle tip position multiple 18 gauge core biopsies were performed with images placed into formalin solution. Needle was removed and a final CT was acquired. Manual pressure was used for hemostasis and a sterile dressing was placed. No complications were encountered no significant blood loss was  encountered. Patient tolerated the procedure well and remained hemodynamically stable throughout. IMPRESSION: Status post CT-guided biopsy of pelvic mass. Signed, Yvone Neu. Miachel Roux, RPVI [facility] Electronically Signed   By: Gilmer Mor D.O.   On: 12/09/2022 10:10   MR PELVIS W WO CONTRAST  Result Date: 11/15/2022 CLINICAL DATA:  Presacral mass, history of rectal cancer status post resection with colostomy EXAM: MRI PELVIS WITHOUT AND WITH CONTRAST TECHNIQUE: Multiplanar multisequence MR imaging of the pelvis was performed both before and after administration of intravenous contrast. CONTRAST:  7mL GADAVIST GADOBUTROL 1 MMOL/ML IV SOLN COMPARISON:  CT abdomen pelvis, 10/13/2022, MR pelvis, 09/29/2018 FINDINGS: Urinary Tract: Unchanged thickening of the urinary bladder wall (series 8, image 22). Bowel: Status post abdominoperineal resection with left lower quadrant end colostomy. Compared to prior CT and MR, no significant change in appearance of heterogeneous, rim enhancing presacral and low pelvic soft tissue and fluid. Largest heterogeneously enhancing conglomerate of fluid and soft tissue appears to closely involve the anteriorly abutting the seminal vesicles and measures 3.0 x 2.9 cm in largest axial dimension (series 11, image 22). This extends to superiorly contact  the posterior bladder dome (series 11, image 15, series 5, image 17) and inferiorly towards the gluteal cleft. There is a fluid component inferiorly measuring 2.4 x 1.3 cm (series 11, image 29). Additional intrinsically T1 hyperintense, rim enhancing hemorrhagic or proteinaceous fluid collection in the right hemipelvis measuring 3.2 x 2.6 cm (series 11, image 19). Vascular/Lymphatic: No pathologically enlarged lymph nodes. No significant vascular abnormality seen. Reproductive:  No mass or other significant abnormality Other:  None. Musculoskeletal: No suspicious bone lesions  identified. IMPRESSION: 1. Status post abdominoperineal resection with left lower quadrant end colostomy. 2. Compared to prior CT and MR, no significant change in appearance of heterogeneous, rim enhancing presacral and low pelvic soft tissue and fluid. Largest heterogeneously enhancing conglomerate of fluid and soft tissue appears to closely involve the anteriorly abutting the seminal vesicles and measures 3.0 x 2.9 cm in largest axial dimension. This extends to superiorly contact the posterior bladder dome and inferiorly towards the gluteal cleft. 3. Discrete fluid component at the most inferior extent measuring 2.4 x 1.3 cm. This is markedly diminished in volume compared to more remote previous examinations, for example 06/26/2022. 4. Constellation of findings is highly concerning for locally recurrent rectal malignancy with or without superimposed infection. Presence or absence of infection at this time is not established by MR. 5. Additional unchanged hemorrhagic or proteinaceous fluid collection in the right hemipelvis measuring 3.2 x 2.6 cm most consistent with postoperative hematoma or seroma. 6. Unchanged thickening of the urinary bladder wall. As previously reported, fistula or involvement of bladder wall by malignancy not excluded. Electronically Signed   By: Jearld Lesch M.D.   On: 11/15/2022 15:36

## 2023-01-23 NOTE — Progress Notes (Signed)
Pt here for follow up. No new concerns voiced.   

## 2023-01-23 NOTE — Assessment & Plan Note (Signed)
Previously discussed with IR, CT-guided biopsy is difficult due to the position of small size. Negative on PET evaluation.  Attention on follow-up CT scans.

## 2023-01-23 NOTE — Assessment & Plan Note (Signed)
Continue potassium supplementation 

## 2023-01-23 NOTE — Assessment & Plan Note (Signed)
Chemotherapy plan as listed above 

## 2023-01-26 ENCOUNTER — Other Ambulatory Visit: Payer: Self-pay

## 2023-01-26 ENCOUNTER — Inpatient Hospital Stay (HOSPITAL_BASED_OUTPATIENT_CLINIC_OR_DEPARTMENT_OTHER): Payer: 59 | Admitting: Hospice and Palliative Medicine

## 2023-01-26 ENCOUNTER — Ambulatory Visit
Admission: RE | Admit: 2023-01-26 | Discharge: 2023-01-26 | Disposition: A | Discharge status: Home / Self Care | Payer: 59 | Source: Ambulatory Visit | Attending: Radiation Oncology | Admitting: Radiation Oncology

## 2023-01-26 DIAGNOSIS — Z933 Colostomy status: Secondary | ICD-10-CM | POA: Diagnosis not present

## 2023-01-26 DIAGNOSIS — C20 Malignant neoplasm of rectum: Secondary | ICD-10-CM | POA: Diagnosis not present

## 2023-01-26 DIAGNOSIS — Z9221 Personal history of antineoplastic chemotherapy: Secondary | ICD-10-CM | POA: Diagnosis not present

## 2023-01-26 DIAGNOSIS — Z510 Encounter for antineoplastic radiation therapy: Secondary | ICD-10-CM | POA: Diagnosis not present

## 2023-01-26 DIAGNOSIS — R911 Solitary pulmonary nodule: Secondary | ICD-10-CM | POA: Diagnosis not present

## 2023-01-26 LAB — RAD ONC ARIA SESSION SUMMARY
Course Elapsed Days: 18
Plan Fractions Treated to Date: 13
Plan Prescribed Dose Per Fraction: 1.8 Gy
Plan Total Fractions Prescribed: 25
Plan Total Prescribed Dose: 45 Gy
Reference Point Dosage Given to Date: 23.4 Gy
Reference Point Session Dosage Given: 1.8 Gy
Session Number: 13

## 2023-01-26 NOTE — Progress Notes (Signed)
Voicemail left.  Will reschedule. °

## 2023-01-27 ENCOUNTER — Encounter: Payer: Self-pay | Admitting: Oncology

## 2023-01-27 ENCOUNTER — Ambulatory Visit
Admission: RE | Admit: 2023-01-27 | Discharge: 2023-01-27 | Disposition: A | Discharge status: Home / Self Care | Payer: 59 | Source: Ambulatory Visit | Attending: Radiation Oncology | Admitting: Radiation Oncology

## 2023-01-27 ENCOUNTER — Other Ambulatory Visit: Payer: Self-pay

## 2023-01-27 ENCOUNTER — Other Ambulatory Visit: Payer: Self-pay | Admitting: Oncology

## 2023-01-27 DIAGNOSIS — C20 Malignant neoplasm of rectum: Secondary | ICD-10-CM | POA: Diagnosis not present

## 2023-01-27 DIAGNOSIS — R911 Solitary pulmonary nodule: Secondary | ICD-10-CM | POA: Diagnosis not present

## 2023-01-27 DIAGNOSIS — Z510 Encounter for antineoplastic radiation therapy: Secondary | ICD-10-CM | POA: Diagnosis not present

## 2023-01-27 DIAGNOSIS — Z9221 Personal history of antineoplastic chemotherapy: Secondary | ICD-10-CM | POA: Diagnosis not present

## 2023-01-27 DIAGNOSIS — Z933 Colostomy status: Secondary | ICD-10-CM | POA: Diagnosis not present

## 2023-01-27 LAB — RAD ONC ARIA SESSION SUMMARY
Course Elapsed Days: 19
Plan Fractions Treated to Date: 14
Plan Prescribed Dose Per Fraction: 1.8 Gy
Plan Total Fractions Prescribed: 25
Plan Total Prescribed Dose: 45 Gy
Reference Point Dosage Given to Date: 25.2 Gy
Reference Point Session Dosage Given: 1.8 Gy
Session Number: 14

## 2023-01-27 MED ORDER — OXYCODONE HCL 5 MG PO TABS
5.0000 mg | ORAL_TABLET | Freq: Four times a day (QID) | ORAL | 0 refills | Status: DC | PRN
  Filled 2023-01-27: qty 30, 8d supply, fill #0

## 2023-01-27 NOTE — Telephone Encounter (Signed)
Please move up appt with Josh and inform pt of appt, he is here every day for radiation.

## 2023-01-28 ENCOUNTER — Ambulatory Visit: Payer: 59

## 2023-01-28 ENCOUNTER — Ambulatory Visit: Payer: Self-pay | Admitting: Urology

## 2023-01-29 ENCOUNTER — Other Ambulatory Visit: Payer: Self-pay

## 2023-01-29 ENCOUNTER — Ambulatory Visit
Admission: RE | Admit: 2023-01-29 | Discharge: 2023-01-29 | Disposition: A | Discharge status: Home / Self Care | Payer: 59 | Source: Ambulatory Visit | Attending: Radiation Oncology | Admitting: Radiation Oncology

## 2023-01-29 ENCOUNTER — Inpatient Hospital Stay: Payer: 59 | Admitting: Hospice and Palliative Medicine

## 2023-01-29 DIAGNOSIS — Z933 Colostomy status: Secondary | ICD-10-CM | POA: Diagnosis not present

## 2023-01-29 DIAGNOSIS — C20 Malignant neoplasm of rectum: Secondary | ICD-10-CM | POA: Diagnosis not present

## 2023-01-29 DIAGNOSIS — Z9221 Personal history of antineoplastic chemotherapy: Secondary | ICD-10-CM | POA: Diagnosis not present

## 2023-01-29 DIAGNOSIS — R911 Solitary pulmonary nodule: Secondary | ICD-10-CM | POA: Diagnosis not present

## 2023-01-29 DIAGNOSIS — Z510 Encounter for antineoplastic radiation therapy: Secondary | ICD-10-CM | POA: Diagnosis not present

## 2023-01-29 LAB — RAD ONC ARIA SESSION SUMMARY
Course Elapsed Days: 21
Plan Fractions Treated to Date: 15
Plan Prescribed Dose Per Fraction: 1.8 Gy
Plan Total Fractions Prescribed: 25
Plan Total Prescribed Dose: 45 Gy
Reference Point Dosage Given to Date: 27 Gy
Reference Point Session Dosage Given: 1.8 Gy
Session Number: 15

## 2023-01-30 ENCOUNTER — Other Ambulatory Visit: Payer: Self-pay

## 2023-01-30 ENCOUNTER — Encounter: Payer: Self-pay | Admitting: Urology

## 2023-01-30 ENCOUNTER — Ambulatory Visit
Admission: RE | Admit: 2023-01-30 | Discharge: 2023-01-30 | Disposition: A | Discharge status: Home / Self Care | Payer: 59 | Source: Ambulatory Visit | Attending: Radiation Oncology | Admitting: Radiation Oncology

## 2023-01-30 DIAGNOSIS — Z9221 Personal history of antineoplastic chemotherapy: Secondary | ICD-10-CM | POA: Diagnosis not present

## 2023-01-30 DIAGNOSIS — Z933 Colostomy status: Secondary | ICD-10-CM | POA: Diagnosis not present

## 2023-01-30 DIAGNOSIS — Z510 Encounter for antineoplastic radiation therapy: Secondary | ICD-10-CM | POA: Diagnosis not present

## 2023-01-30 DIAGNOSIS — C20 Malignant neoplasm of rectum: Secondary | ICD-10-CM | POA: Diagnosis not present

## 2023-01-30 DIAGNOSIS — R911 Solitary pulmonary nodule: Secondary | ICD-10-CM | POA: Diagnosis not present

## 2023-01-30 LAB — RAD ONC ARIA SESSION SUMMARY
Course Elapsed Days: 22
Plan Fractions Treated to Date: 16
Plan Prescribed Dose Per Fraction: 1.8 Gy
Plan Total Fractions Prescribed: 25
Plan Total Prescribed Dose: 45 Gy
Reference Point Dosage Given to Date: 28.8 Gy
Reference Point Session Dosage Given: 1.8 Gy
Session Number: 16

## 2023-02-03 ENCOUNTER — Ambulatory Visit
Admission: RE | Admit: 2023-02-03 | Discharge: 2023-02-03 | Disposition: A | Discharge status: Home / Self Care | Payer: 59 | Source: Ambulatory Visit | Attending: Radiation Oncology | Admitting: Radiation Oncology

## 2023-02-03 ENCOUNTER — Other Ambulatory Visit: Payer: Self-pay

## 2023-02-03 DIAGNOSIS — Z510 Encounter for antineoplastic radiation therapy: Secondary | ICD-10-CM | POA: Diagnosis not present

## 2023-02-03 DIAGNOSIS — R911 Solitary pulmonary nodule: Secondary | ICD-10-CM | POA: Diagnosis not present

## 2023-02-03 DIAGNOSIS — C20 Malignant neoplasm of rectum: Secondary | ICD-10-CM | POA: Diagnosis not present

## 2023-02-03 DIAGNOSIS — Z933 Colostomy status: Secondary | ICD-10-CM | POA: Diagnosis not present

## 2023-02-03 DIAGNOSIS — Z9221 Personal history of antineoplastic chemotherapy: Secondary | ICD-10-CM | POA: Diagnosis not present

## 2023-02-03 LAB — RAD ONC ARIA SESSION SUMMARY
Course Elapsed Days: 26
Plan Fractions Treated to Date: 17
Plan Prescribed Dose Per Fraction: 1.8 Gy
Plan Total Fractions Prescribed: 25
Plan Total Prescribed Dose: 45 Gy
Reference Point Dosage Given to Date: 30.6 Gy
Reference Point Session Dosage Given: 1.8 Gy
Session Number: 17

## 2023-02-04 ENCOUNTER — Other Ambulatory Visit: Payer: Self-pay

## 2023-02-04 ENCOUNTER — Ambulatory Visit
Admission: RE | Admit: 2023-02-04 | Discharge: 2023-02-04 | Disposition: A | Discharge status: Home / Self Care | Payer: 59 | Source: Ambulatory Visit | Attending: Radiation Oncology | Admitting: Radiation Oncology

## 2023-02-04 DIAGNOSIS — Z933 Colostomy status: Secondary | ICD-10-CM | POA: Diagnosis not present

## 2023-02-04 DIAGNOSIS — R911 Solitary pulmonary nodule: Secondary | ICD-10-CM | POA: Diagnosis not present

## 2023-02-04 DIAGNOSIS — C20 Malignant neoplasm of rectum: Secondary | ICD-10-CM | POA: Diagnosis not present

## 2023-02-04 DIAGNOSIS — Z9221 Personal history of antineoplastic chemotherapy: Secondary | ICD-10-CM | POA: Diagnosis not present

## 2023-02-04 DIAGNOSIS — Z510 Encounter for antineoplastic radiation therapy: Secondary | ICD-10-CM | POA: Diagnosis not present

## 2023-02-04 LAB — RAD ONC ARIA SESSION SUMMARY
Course Elapsed Days: 27
Plan Fractions Treated to Date: 18
Plan Prescribed Dose Per Fraction: 1.8 Gy
Plan Total Fractions Prescribed: 25
Plan Total Prescribed Dose: 45 Gy
Reference Point Dosage Given to Date: 32.4 Gy
Reference Point Session Dosage Given: 1.8 Gy
Session Number: 18

## 2023-02-05 ENCOUNTER — Other Ambulatory Visit: Payer: Self-pay

## 2023-02-05 ENCOUNTER — Ambulatory Visit
Admission: RE | Admit: 2023-02-05 | Discharge: 2023-02-05 | Disposition: A | Discharge status: Home / Self Care | Payer: 59 | Source: Ambulatory Visit | Attending: Radiation Oncology | Admitting: Radiation Oncology

## 2023-02-05 ENCOUNTER — Encounter: Payer: Self-pay | Admitting: Oncology

## 2023-02-05 DIAGNOSIS — C20 Malignant neoplasm of rectum: Secondary | ICD-10-CM | POA: Diagnosis not present

## 2023-02-05 DIAGNOSIS — Z9221 Personal history of antineoplastic chemotherapy: Secondary | ICD-10-CM | POA: Diagnosis not present

## 2023-02-05 DIAGNOSIS — Z933 Colostomy status: Secondary | ICD-10-CM | POA: Diagnosis not present

## 2023-02-05 DIAGNOSIS — Z510 Encounter for antineoplastic radiation therapy: Secondary | ICD-10-CM | POA: Diagnosis not present

## 2023-02-05 DIAGNOSIS — R911 Solitary pulmonary nodule: Secondary | ICD-10-CM | POA: Diagnosis not present

## 2023-02-05 LAB — RAD ONC ARIA SESSION SUMMARY
Course Elapsed Days: 28
Plan Fractions Treated to Date: 19
Plan Prescribed Dose Per Fraction: 1.8 Gy
Plan Total Fractions Prescribed: 25
Plan Total Prescribed Dose: 45 Gy
Reference Point Dosage Given to Date: 34.2 Gy
Reference Point Session Dosage Given: 1.8 Gy
Session Number: 19

## 2023-02-06 ENCOUNTER — Telehealth: Payer: Self-pay | Admitting: Oncology

## 2023-02-06 ENCOUNTER — Ambulatory Visit: Payer: 59

## 2023-02-06 ENCOUNTER — Inpatient Hospital Stay: Payer: 59 | Admitting: Oncology

## 2023-02-06 ENCOUNTER — Inpatient Hospital Stay: Payer: 59

## 2023-02-06 ENCOUNTER — Other Ambulatory Visit: Payer: Self-pay

## 2023-02-06 DIAGNOSIS — C20 Malignant neoplasm of rectum: Secondary | ICD-10-CM

## 2023-02-06 MED ORDER — CAPECITABINE 500 MG PO TABS
825.0000 mg/m2 | ORAL_TABLET | Freq: Two times a day (BID) | ORAL | 0 refills | Status: DC
Start: 2023-02-06 — End: 2023-03-16

## 2023-02-06 NOTE — Telephone Encounter (Signed)
[facility] called and left message with our answering service.Cancel his appointments today with radiation and Dr Cathie Hoops. He is not feeling well.

## 2023-02-06 NOTE — Assessment & Plan Note (Deleted)
locally advanced rectal cancer-Stage IIIB, s/p TNT neoadjuvant protocol, concurrent Xeloda and radiation-Finished 08/27/2021, s/p  APR,rectal adenocarcinoma, ypT3N0. Positive radial margin due to perforation. Biopsy proven recurrent rectal cancer, locally advanced disease, possible colovesical fistula; no distant metastatic disease on PET  I have discussed with his [name] Surgeon Dr. Mantyh, recurrence is not resectable.  Recommend combined modality therapy with concurrent Xeloda 825 mg/m2 twice daily  with radiation Labs are reviewed and continue Xeloda 1500mg BID on treatment days.  Follow up with Radonc for RT.   Imodum and antiemetics prescription sent to pharmacy 

## 2023-02-09 ENCOUNTER — Ambulatory Visit
Admission: RE | Admit: 2023-02-09 | Discharge: 2023-02-09 | Disposition: A | Discharge status: Home / Self Care | Payer: 59 | Source: Ambulatory Visit | Attending: Radiation Oncology | Admitting: Radiation Oncology

## 2023-02-09 ENCOUNTER — Other Ambulatory Visit: Payer: Self-pay

## 2023-02-09 DIAGNOSIS — Z933 Colostomy status: Secondary | ICD-10-CM | POA: Diagnosis not present

## 2023-02-09 DIAGNOSIS — C20 Malignant neoplasm of rectum: Secondary | ICD-10-CM | POA: Diagnosis not present

## 2023-02-09 DIAGNOSIS — Z510 Encounter for antineoplastic radiation therapy: Secondary | ICD-10-CM | POA: Diagnosis not present

## 2023-02-09 DIAGNOSIS — Z9221 Personal history of antineoplastic chemotherapy: Secondary | ICD-10-CM | POA: Diagnosis not present

## 2023-02-09 DIAGNOSIS — R911 Solitary pulmonary nodule: Secondary | ICD-10-CM | POA: Diagnosis not present

## 2023-02-09 LAB — RAD ONC ARIA SESSION SUMMARY
Course Elapsed Days: 32
Plan Fractions Treated to Date: 20
Plan Prescribed Dose Per Fraction: 1.8 Gy
Plan Total Fractions Prescribed: 25
Plan Total Prescribed Dose: 45 Gy
Reference Point Dosage Given to Date: 36 Gy
Reference Point Session Dosage Given: 1.8 Gy
Session Number: 20

## 2023-02-10 ENCOUNTER — Ambulatory Visit
Admission: RE | Admit: 2023-02-10 | Discharge: 2023-02-10 | Disposition: A | Discharge status: Home / Self Care | Payer: 59 | Source: Ambulatory Visit | Attending: Radiation Oncology | Admitting: Radiation Oncology

## 2023-02-10 ENCOUNTER — Other Ambulatory Visit: Payer: Self-pay

## 2023-02-10 DIAGNOSIS — C20 Malignant neoplasm of rectum: Secondary | ICD-10-CM | POA: Diagnosis not present

## 2023-02-10 DIAGNOSIS — Z933 Colostomy status: Secondary | ICD-10-CM | POA: Diagnosis not present

## 2023-02-10 DIAGNOSIS — Z9221 Personal history of antineoplastic chemotherapy: Secondary | ICD-10-CM | POA: Diagnosis not present

## 2023-02-10 DIAGNOSIS — Z510 Encounter for antineoplastic radiation therapy: Secondary | ICD-10-CM | POA: Diagnosis not present

## 2023-02-10 DIAGNOSIS — R911 Solitary pulmonary nodule: Secondary | ICD-10-CM | POA: Diagnosis not present

## 2023-02-10 LAB — RAD ONC ARIA SESSION SUMMARY
Course Elapsed Days: 33
Plan Fractions Treated to Date: 21
Plan Prescribed Dose Per Fraction: 1.8 Gy
Plan Total Fractions Prescribed: 25
Plan Total Prescribed Dose: 45 Gy
Reference Point Dosage Given to Date: 37.8 Gy
Reference Point Session Dosage Given: 1.8 Gy
Session Number: 21

## 2023-02-11 ENCOUNTER — Ambulatory Visit: Payer: 59

## 2023-02-11 ENCOUNTER — Emergency Department
Admission: EM | Admit: 2023-02-11 | Discharge: 2023-02-11 | Disposition: A | Discharge status: Other Acute Inpatient Hospital | Payer: 59 | Attending: Emergency Medicine | Admitting: Emergency Medicine

## 2023-02-11 ENCOUNTER — Emergency Department: Payer: 59

## 2023-02-11 ENCOUNTER — Encounter: Payer: Self-pay | Admitting: Emergency Medicine

## 2023-02-11 ENCOUNTER — Other Ambulatory Visit: Payer: Self-pay

## 2023-02-11 DIAGNOSIS — Z91013 Allergy to seafood: Secondary | ICD-10-CM | POA: Diagnosis not present

## 2023-02-11 DIAGNOSIS — R112 Nausea with vomiting, unspecified: Secondary | ICD-10-CM | POA: Diagnosis not present

## 2023-02-11 DIAGNOSIS — Z933 Colostomy status: Secondary | ICD-10-CM | POA: Diagnosis not present

## 2023-02-11 DIAGNOSIS — K56600 Partial intestinal obstruction, unspecified as to cause: Secondary | ICD-10-CM | POA: Diagnosis not present

## 2023-02-11 DIAGNOSIS — F17210 Nicotine dependence, cigarettes, uncomplicated: Secondary | ICD-10-CM | POA: Diagnosis not present

## 2023-02-11 DIAGNOSIS — K56609 Unspecified intestinal obstruction, unspecified as to partial versus complete obstruction: Secondary | ICD-10-CM

## 2023-02-11 DIAGNOSIS — R1084 Generalized abdominal pain: Secondary | ICD-10-CM | POA: Diagnosis not present

## 2023-02-11 DIAGNOSIS — Z743 Need for continuous supervision: Secondary | ICD-10-CM | POA: Diagnosis not present

## 2023-02-11 DIAGNOSIS — N3289 Other specified disorders of bladder: Secondary | ICD-10-CM | POA: Diagnosis not present

## 2023-02-11 DIAGNOSIS — R9431 Abnormal electrocardiogram [ECG] [EKG]: Secondary | ICD-10-CM | POA: Diagnosis not present

## 2023-02-11 DIAGNOSIS — Z85048 Personal history of other malignant neoplasm of rectum, rectosigmoid junction, and anus: Secondary | ICD-10-CM | POA: Diagnosis not present

## 2023-02-11 DIAGNOSIS — C218 Malignant neoplasm of overlapping sites of rectum, anus and anal canal: Secondary | ICD-10-CM | POA: Diagnosis not present

## 2023-02-11 DIAGNOSIS — R110 Nausea: Secondary | ICD-10-CM

## 2023-02-11 DIAGNOSIS — R109 Unspecified abdominal pain: Secondary | ICD-10-CM | POA: Diagnosis not present

## 2023-02-11 DIAGNOSIS — N139 Obstructive and reflux uropathy, unspecified: Secondary | ICD-10-CM | POA: Diagnosis not present

## 2023-02-11 DIAGNOSIS — C20 Malignant neoplasm of rectum: Secondary | ICD-10-CM

## 2023-02-11 DIAGNOSIS — N1330 Unspecified hydronephrosis: Secondary | ICD-10-CM | POA: Diagnosis not present

## 2023-02-11 DIAGNOSIS — Z4659 Encounter for fitting and adjustment of other gastrointestinal appliance and device: Secondary | ICD-10-CM | POA: Diagnosis not present

## 2023-02-11 DIAGNOSIS — I10 Essential (primary) hypertension: Secondary | ICD-10-CM | POA: Diagnosis not present

## 2023-02-11 DIAGNOSIS — Z9221 Personal history of antineoplastic chemotherapy: Secondary | ICD-10-CM | POA: Diagnosis not present

## 2023-02-11 DIAGNOSIS — K5902 Outlet dysfunction constipation: Secondary | ICD-10-CM | POA: Diagnosis not present

## 2023-02-11 DIAGNOSIS — N134 Hydroureter: Secondary | ICD-10-CM | POA: Diagnosis not present

## 2023-02-11 LAB — CBC WITH DIFFERENTIAL/PLATELET
Abs Immature Granulocytes: 0.06 10*3/uL (ref 0.00–0.07)
Basophils Absolute: 0 10*3/uL (ref 0.0–0.1)
Basophils Relative: 0 %
Eosinophils Absolute: 0.1 10*3/uL (ref 0.0–0.5)
Eosinophils Relative: 1 %
HCT: 39.5 % (ref 39.0–52.0)
Hemoglobin: 13.7 g/dL (ref 13.0–17.0)
Immature Granulocytes: 1 %
Lymphocytes Relative: 8 %
Lymphs Abs: 0.9 10*3/uL (ref 0.7–4.0)
MCH: 30.6 pg (ref 26.0–34.0)
MCHC: 34.7 g/dL (ref 30.0–36.0)
MCV: 88.2 fL (ref 80.0–100.0)
Monocytes Absolute: 1 10*3/uL (ref 0.1–1.0)
Monocytes Relative: 10 %
Neutro Abs: 8.2 10*3/uL — ABNORMAL HIGH (ref 1.7–7.7)
Neutrophils Relative %: 80 %
Platelets: 204 10*3/uL (ref 150–400)
RBC: 4.48 MIL/uL (ref 4.22–5.81)
RDW: 16.7 % — ABNORMAL HIGH (ref 11.5–15.5)
Smear Review: NORMAL
WBC: 10.3 10*3/uL (ref 4.0–10.5)
nRBC: 0 % (ref 0.0–0.2)

## 2023-02-11 LAB — LACTIC ACID, PLASMA: Lactic Acid, Venous: 1.6 mmol/L (ref 0.5–1.9)

## 2023-02-11 LAB — URINALYSIS, ROUTINE W REFLEX MICROSCOPIC
Bacteria, UA: NONE SEEN
Bilirubin Urine: NEGATIVE
Glucose, UA: NEGATIVE mg/dL
Hgb urine dipstick: NEGATIVE
Ketones, ur: NEGATIVE mg/dL
Leukocytes,Ua: NEGATIVE
Nitrite: NEGATIVE
Protein, ur: 30 mg/dL — AB
Specific Gravity, Urine: 1.012 (ref 1.005–1.030)
Squamous Epithelial / HPF: NONE SEEN /HPF (ref 0–5)
pH: 5 (ref 5.0–8.0)

## 2023-02-11 LAB — COMPREHENSIVE METABOLIC PANEL
ALT: 15 U/L (ref 0–44)
AST: 22 U/L (ref 15–41)
Albumin: 3.6 g/dL (ref 3.5–5.0)
Alkaline Phosphatase: 85 U/L (ref 38–126)
Anion gap: 9 (ref 5–15)
BUN: 12 mg/dL (ref 6–20)
CO2: 23 mmol/L (ref 22–32)
Calcium: 9 mg/dL (ref 8.9–10.3)
Chloride: 104 mmol/L (ref 98–111)
Creatinine, Ser: 0.94 mg/dL (ref 0.61–1.24)
GFR, Estimated: >60 mL/min (ref >60)
Glucose, Bld: 112 mg/dL — ABNORMAL HIGH (ref 70–99)
Potassium: 3.9 mmol/L (ref 3.5–5.1)
Sodium: 136 mmol/L (ref 135–145)
Total Bilirubin: 0.6 mg/dL (ref 0.3–1.2)
Total Protein: 7.6 g/dL (ref 6.5–8.1)

## 2023-02-11 LAB — LIPASE, BLOOD: Lipase: 28 U/L (ref 11–51)

## 2023-02-11 LAB — PROTIME-INR
INR: 1 (ref 0.8–1.2)
Prothrombin Time: 13 seconds (ref 11.4–15.2)

## 2023-02-11 LAB — CULTURE, BLOOD (SINGLE): Culture: NO GROWTH

## 2023-02-11 MED ORDER — DROPERIDOL 2.5 MG/ML IJ SOLN
2.5000 mg | Freq: Once | INTRAMUSCULAR | Status: AC
  Administered 2023-02-11: 2.5 mg via INTRAVENOUS
  Filled 2023-02-11: qty 2

## 2023-02-11 MED ORDER — HYDROMORPHONE HCL 1 MG/ML IJ SOLN
1.0000 mg | INTRAMUSCULAR | Status: AC
  Administered 2023-02-11: 1 mg via INTRAVENOUS
  Filled 2023-02-11: qty 1

## 2023-02-11 MED ORDER — SODIUM CHLORIDE 0.9 % IV SOLN: Freq: Once | INTRAVENOUS | Status: DC

## 2023-02-11 MED ORDER — ONDANSETRON HCL 4 MG/2ML IJ SOLN
4.0000 mg | INTRAMUSCULAR | Status: AC
  Administered 2023-02-11: 4 mg via INTRAVENOUS
  Filled 2023-02-11: qty 2

## 2023-02-11 MED ORDER — LACTATED RINGERS IV BOLUS
1000.0000 mL | Freq: Once | INTRAVENOUS | Status: AC
  Administered 2023-02-11: 1000 mL via INTRAVENOUS

## 2023-02-11 MED ORDER — IOHEXOL 300 MG/ML  SOLN
100.0000 mL | Freq: Once | INTRAMUSCULAR | Status: AC | PRN
  Administered 2023-02-11: 100 mL via INTRAVENOUS

## 2023-02-11 MED ORDER — MORPHINE SULFATE (PF) 4 MG/ML IV SOLN
4.0000 mg | Freq: Once | INTRAVENOUS | Status: AC
  Administered 2023-02-11: 4 mg via INTRAVENOUS
  Filled 2023-02-11: qty 1

## 2023-02-11 NOTE — ED Notes (Signed)
Called [name] for update on bed availability still waiting for bed

## 2023-02-11 NOTE — ED Notes (Signed)
Pt accepted to [name] awaiting bed assignment

## 2023-02-11 NOTE — ED Provider Notes (Addendum)
-----------------------------------------   8:27 AM on 02/11/2023 ----------------------------------------- Patient care assumed from Dr. York Cerise.  Patient CT is consistent with a small bowel obstruction.  Patient states he had the ostomy placed approximately 1.5 years ago at [facility].  Patient denies any history of bowel obstruction previously.  Will place an NG tube for bowel decompression.  We will discuss with the hospitalist for admission for further treatment.  Patient agreeable to plan of care.    I spoke with Dr. Claudine Mouton of general surgery.  He states he would much prefer the patient to be transferred back to [facility] given his complicated history and prior surgery performed by colorectal surgery March 2023.  I spoke to Dr. Abigail Miyamoto at [facility] who has agreed to take the patient back to [name] although she states unlikely that the patient will require surgery.  We will arrange transfer back to [facility].   Minna Antis, MD 02/11/23 1026

## 2023-02-11 NOTE — ED Notes (Signed)
Called xray for NG placement confirmation.

## 2023-02-11 NOTE — ED Notes (Signed)
Pt requested bedside urinal to use and was given one. Pt told to let RN know when done so urine can be disposed of and bed can be repositioned.

## 2023-02-11 NOTE — ED Provider Notes (Signed)
[facility] Provider Note    Event Date/Time   First MD Initiated Contact with Patient 02/11/23 [phone]     (approximate)   History   Abdominal Pain   HPI [name]. is a 57 y.o. male who has a history of rectal cancer currently undergoing radiation therapy directed by Dr. Cathie Hoops and who previously was on chemotherapy but no longer.  He has an ostomy.  He presents for evaluation of acute on sudden severe abdominal pain at approximately 3 AM that woke him from sleep.  He has had nausea but no vomiting.  He notes that he has had substantially decreased ostomy output for more than a day.  He has not felt well for about a week, but in general, not with any specific signs or symptoms.  He said this is completely different and he has never felt pain like this before.  He has never had a bowel obstruction of which he is aware.     Physical Exam   Triage Vital Signs: ED Triage Vitals  Enc Vitals Group     BP 02/11/23 0504 (!) 181/100     Pulse Rate 02/11/23 0504 97     Resp --      Temp 02/11/23 0504 97.9 F (36.6 C)     Temp Source 02/11/23 0504 Oral     SpO2 02/11/23 0504 100 %     Weight 02/11/23 0457 75.3 kg (166 lb)     Height 02/11/23 0457 1.727 m (5\' 8" )     Head Circumference --      Peak Flow --      Pain Score 02/11/23 0457 10     Pain Loc --      Pain Edu? --      Excl. in GC? --     Most recent vital signs: Vitals:   02/11/23 0630 02/11/23 0710  BP: (!) 138/94 (!) 163/110  Pulse: 94 87  Temp:    SpO2: 95% 100%    General: Awake, severe distress from pain.  Alert and oriented. CV:  Good peripheral perfusion. Regular rate/rhythm. Resp:  Normal effort. Speaking easily and comfortably, no accessory muscle usage nor intercostal retractions.   Abd:  possible distention. Severe tenderness to palpation with guarding through the abdomen, though tenderness seems to localize to RLQ.  Question peritonitis. Other:  Mood and affect are  appropriate under the circumstances.  AOx3.   ED Results / Procedures / Treatments   Labs (all labs ordered are listed, but only abnormal results are displayed) Labs Reviewed  CBC WITH DIFFERENTIAL/PLATELET - Abnormal; Notable for the following components:      Result Value   RDW 16.7 (*)    Neutro Abs 8.2 (*)    All other components within normal limits  COMPREHENSIVE METABOLIC PANEL - Abnormal; Notable for the following components:   Glucose, Bld 112 (*)    All other components within normal limits  URINALYSIS, ROUTINE W REFLEX MICROSCOPIC - Abnormal; Notable for the following components:   Color, Urine YELLOW (*)    APPearance CLEAR (*)    Protein, ur 30 (*)    All other components within normal limits  CULTURE, BLOOD (SINGLE)  LIPASE, BLOOD  LACTIC ACID, PLASMA  PROTIME-INR     RADIOLOGY CT of the abdomen and pelvis pending at time of transfer of care to Dr. Lenard Lance.   PROCEDURES:  Critical Care performed: No  .1-3 Lead EKG Interpretation  Performed by: Loleta Rose,  MD Authorized by: Loleta Rose, MD     Interpretation: normal     ECG rate:  90   ECG rate assessment: normal     Rhythm: sinus rhythm     Ectopy: none     Conduction: normal       IMPRESSION / MDM / ASSESSMENT AND PLAN / ED COURSE  I reviewed the triage vital signs and the nursing notes.                              Differential diagnosis includes, but is not limited to, SBO/ileus, volvulus, hemoperitoneum or pneumoperitoneum.  Patient's presentation is most consistent with acute presentation with potential threat to life or bodily function.  Labs/studies ordered: CMP, pro time-INR, lactic acid, lipase, CBC with differential, urinalysis, single blood culture, CT abdomen/pelvis  Interventions/Medications given:  Medications  HYDROmorphone (DILAUDID) injection 1 mg (1 mg Intravenous Given 02/11/23 0547)  ondansetron (ZOFRAN) injection 4 mg (4 mg Intravenous Given 02/11/23 0548)   lactated ringers bolus 1,000 mL (1,000 mLs Intravenous New Bag/Given 02/11/23 0550)  droperidol (INAPSINE) 2.5 MG/ML injection 2.5 mg (2.5 mg Intravenous Given 02/11/23 0709)  iohexol (OMNIPAQUE) 300 MG/ML solution 100 mL (100 mLs Intravenous Contrast Given 02/11/23 0651)    (Note:  hospital course my include additional interventions and/or labs/studies not listed above.)   Strongly suspect bowel obstruction given decreased ostomy output and the amount of severe pain and nausea the patient is experiencing.  Vital signs are stable.  Dilaudid and Zofran with a liter IV bolus of LR as described above.  I will check labs and anticipate proceeding with CT scan preferably with IV contrast if kidney function is appropriate.  The patient is on the cardiac monitor to evaluate for evidence of arrhythmia and/or significant heart rate changes.   Clinical Course as of 02/11/23 0713  Wed Feb 11, 2023  0617 Labs notable for an essentially normal metabolic panel with normal renal function and a normal urinalysis.  Lipase is also within normal limits.  White blood cell count is pending on the CBC, likely as result of the patient's oncological status.  Ordering CT abdomen/pelvis with IV contrast for evaluation of possible bowel obstruction. [CF]  1610 Lactic Acid, Venous: 1.6 [CF]  0644 Patient's pain has improved but he is still severely nauseated.  I ordered droperidol 2.5 mg IV.  I checked a prior EKG and his QTc was not substantially increased, and given that essentially all antiemetic medications have a theoretical risk of QTc prolongation, I think this will be a good choice for him as it will also be an adjunct with the droperidol to help with his pain. [CF]    Clinical Course User Index [CF] Loleta Rose, MD     FINAL CLINICAL IMPRESSION(S) / ED DIAGNOSES   Final diagnoses:  Generalized abdominal pain  Nausea  Rectal cancer (HCC)     Rx / DC Orders   ED Discharge Orders     None         Note:  This document was prepared using Dragon voice recognition software and may include unintentional dictation errors.   Loleta Rose, MD 02/11/23 [phone]

## 2023-02-11 NOTE — Consult Note (Signed)
Initial Consultation Note   Patient: Barry [name]. JXB:[id number] DOB: 15-Feb-1966 PCP: Pcp, No DOA: 02/11/2023 DOS: the patient was seen and examined on 02/11/2023 Primary service: Minna Antis, MD  Referring physician: Lenard Lance MD  Reason for consult: SBO, obstructive uropathy   Assessment/Plan: Assessment and Plan: No notes have been filed under this hospital service. Service: Hospitalist  Initial plan was for admission for small bowel obstruction and as well as right-sided obstructive uropathy in the setting of prior rectal cancer s/p resection and chemotherapy on radiation with plan for consultation with general surgery as well as urology.  However, per Dr. Lenard Lance, general surgery would like to refer patient to [facility] for further evaluation.  Will otherwise sign off on patient pending transfer to [facility].    [facility] will sign off at present, please call us again when needed.  HPI: Barry [name]. is a 57 y.o. 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 Cathie Hoops presenting with small bowel obstruction and obstructive uropathy.  Patient reports being woke up this morning with severe generalized abdominal pain.  No fevers or chills.  No nausea or vomiting.  Positive inability to tolerate p.o. intake.  Positive recurrent nausea and vomiting.  Still making urine.  Has completed course of chemotherapy.  Currently on radiation treatment for rectal cancer.  No reported alcohol or tobacco use.  On nicotine patches. Presented to the ER afebrile, blood pressures 130s to 180s over 100s.  White count 10.3, hemoglobin 13.7, platelets 204, lactate 1.6, creatinine 0.94.  Urinalysis stable.  CT abdomen pelvis noted concerning for small bowel obstruction versus enteritis also with right-sided obstructive uropathy. Review of Systems: As mentioned in the history of present illness. All other systems  reviewed and are negative. Past Medical History:  Diagnosis Date   Headache    Left shoulder pain    Rectal cancer [facility])    Past Surgical History:  Procedure Laterality Date   COLON SURGERY     COLONOSCOPY WITH PROPOFOL N/A 02/06/2021   Procedure: COLONOSCOPY WITH PROPOFOL;  Surgeon: Toney Reil, MD;  Location: [facility] ENDOSCOPY;  Service: Gastroenterology;  Laterality: N/A;   PORTA CATH INSERTION N/A 02/27/2021   Procedure: PORTA CATH INSERTION;  Surgeon: Annice Needy, MD;  Location: [facility] INVASIVE CV LAB;  Service: Cardiovascular;  Laterality: N/A;   ROTATOR CUFF REPAIR Left    Social History:  reports that he quit smoking about 2 years ago. His smoking use included cigars and cigarettes. He has a 10.00 pack-year smoking history. He has never used smokeless tobacco. He reports that he does not currently use alcohol. He reports that he does not use drugs.  Allergies  Allergen Reactions   Shellfish Allergy Swelling    Contrast Dye is OK     Family History  Problem Relation Age of Onset   Cancer Sister    Diabetes Mother    Cancer Maternal Grandmother    Cancer Paternal Grandmother     Prior to Admission medications   Medication Sig Start Date End Date Taking? Authorizing Provider  capecitabine (XELODA) 500 MG tablet Take 3 tablets (1,500 mg total) by mouth 2 (two) times daily after a meal. Take Monday thru Friday. Take only on days of radiation. 02/06/23   [name], MD  citalopram (CELEXA) 10 MG tablet Take 1 tablet (10 mg total) by mouth daily. 10/27/22   Borders, Daryl Eastern, NP  feeding supplement (ENSURE ENLIVE /  ENSURE PLUS) LIQD Take 237 mLs by mouth daily. Patient not taking: Reported on 10/27/2022 02/07/21   Arnetha Courser, MD  lidocaine-prilocaine (EMLA) cream Apply 1 Application topically as needed. Apply small amount to port and cover with saran wrap 1-2 hours prior to port access 01/09/23   [name], MD  loperamide (IMODIUM) 2 MG capsule Take 2 capsules (4mg ) by mouth  initially. Then take 1 capsule by mouth every 2 hours (4mg  every 4 hours at night). Take no more than 16mg /day Patient not taking: Reported on 01/13/2023 01/07/23   [name], MD  nicotine (NICODERM CQ - DOSED IN MG/24 HR) 7 mg/24hr patch Place 1 patch (7 mg total) onto the skin daily. 01/07/23   [name], MD  ondansetron (ZOFRAN) 8 MG tablet Take 1 tablet (8 mg total) by mouth every 8 (eight) hours as needed for nausea or vomiting. Patient not taking: Reported on 01/13/2023 01/07/23   [name], MD  oxyCODONE (OXY IR/ROXICODONE) 5 MG immediate release tablet Take 1 tablet (5 mg total) by mouth every 6 (six) hours as needed for severe pain. 01/27/23   [name], MD  prochlorperazine (COMPAZINE) 10 MG tablet Take 1 tablet (10 mg total) by mouth every 6 (six) hours as needed for nausea or vomiting. Patient not taking: Reported on 01/13/2023 01/07/23   [name], MD  traZODone (DESYREL) 50 MG tablet Take 1 tablet (50 mg total) by mouth at bedtime as needed for sleep. 10/27/22   Malachy Moan, NP    Physical Exam: Vitals:   02/11/23 0710 02/11/23 0730 02/11/23 0800 02/11/23 0830  BP: (!) 163/110 (!) 144/96 (!) 164/133 (!) 158/100  Pulse: 87 90 96 93  Resp: 16     Temp:      TempSrc:      SpO2: 100% 99% 98% 97%  Weight:      Height:       Physical Exam Constitutional:      Appearance: He is normal weight.  HENT:     Head: Normocephalic and atraumatic.     Mouth/Throat:     Mouth: Mucous membranes are moist.  Eyes:     Pupils: Pupils are equal, round, and reactive to light.  Cardiovascular:     Rate and Rhythm: Normal rate and regular rhythm.  Pulmonary:     Effort: Pulmonary effort is normal.  Abdominal:     General: Bowel sounds are normal.     Comments: + generalized abd pain    Musculoskeletal:        General: Normal range of motion.     Cervical back: Normal range of motion.  Skin:    General: Skin is warm.  Neurological:     General: No focal deficit present.  Psychiatric:        Mood  and Affect: Mood normal.     Data Reviewed:   There are no new results to review at this time.  CT ABDOMEN PELVIS W CONTRAST CLINICAL DATA:  History of rectal cancer. Evaluate for bowel obstruction. * Tracking Code: BO *  EXAM: CT ABDOMEN AND PELVIS WITH CONTRAST  TECHNIQUE: Multidetector CT imaging of the abdomen and pelvis was performed using the standard protocol following bolus administration of intravenous contrast.  RADIATION DOSE REDUCTION: This exam was performed according to the departmental dose-optimization program which includes automated exposure control, adjustment of the mA and/or kV according to patient size and/or use of iterative reconstruction technique.  CONTRAST:  OMNIPAQUE IOHEXOL 300 MG/ML  SOLN  COMPARISON:  PET-CT 12/17/2022  FINDINGS: Lower chest: No acute abnormality.  Hepatobiliary: Unchanged cyst along the inferior margin of the right lobe of liver measuring 1.2 cm. No suspicious liver lesions identified at this time. Gallbladder appears normal without bile duct dilatation.  Pancreas: Unremarkable. No pancreatic ductal dilatation or surrounding inflammatory changes.  Spleen: Normal in size without focal abnormality.  Adrenals/Urinary Tract: Normal adrenal glands. No nephrolithiasis. Several too small to characterize kidney lesions are identified. No follow-up imaging recommended. There is new right-sided hydronephrosis and hydroureter up to the level of the bifurcation of right common iliac artery. The distal right ureter appears closely associated with the presacral soft tissue mass, image 62/2. Normal appearance of the left scratch set no left-sided hydronephrosis. There is diffuse circumferential wall thickening involving the urinary bladder.  Stomach/Bowel: Stomach appears normal. Colostomy is identified within the left lower abdominal wall, just left of the midline. The appendix is visualized and appears normal. Within the  left hemiabdomen there are several loops of small bowel which exhibit wall thickening, which measures up to 8 mm in thickness, image 60/2. Within the left hemiabdomen and extending into the pelvis there is a dilated loop of mid small bowel with fecalized is a shin. This measures up to 3.2 cm. Transition to decreased caliber distal small bowel noted within the left iliac fossa. The small bowel loops distal to the transition point exhibit mucosal enhancement and wall thickening which measures up to 8 mm in thickness. Normal caliber of the colon up to the level of the ostomy.  Vascular/Lymphatic: Aortic atherosclerosis without aneurysm. No pelvic or inguinal adenopathy.  Reproductive: No mass.  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 This was tracer avid on the PET-CT from 12/17/2022 measuring 5.7 x 3.9 cm, similar to the previous exam. Within the right ovoid hypoattenuating lesion in the right posterior pelvis (not FDG avid on recent PET-CT) measures 3.3 x 2.6 cm, image 70/2. Previously this measured the same. No significant free fluid or fluid collections. No signs of pneumoperitoneum.  Musculoskeletal: No acute or significant osseous findings.  IMPRESSION: 1. Examination is positive for small bowel obstruction. Transition point is identified within the left iliac fossa. Distal to the transition point the small bowel loops exhibit mucosal enhancement and wall thickening concerning for enteritis. 2. There is new right-sided hydronephrosis and hydroureter up to the level of the bifurcation of right common iliac artery. The distal right ureter appears closely associated with the previously characterized tracer avid presacral soft tissue mass. Cannot exclude obstructive uropathy secondary to tumor involvement. 3. Similar appearance of presacral soft tissue mass. This was tracer avid on the recent PET-CT from 12/17/2022, and concerning for locally recurrent tumor. 4.  Unchanged diffuse circumferential wall thickening involving the urinary bladder. 5. Aortic Atherosclerosis (ICD10-I70.0).  Electronically Signed   By: Signa Kell M.D.   On: 02/11/2023 07:47  Lab Results  Component Value Date   WBC 10.3 02/11/2023   HGB 13.7 02/11/2023   HCT 39.5 02/11/2023   MCV 88.2 02/11/2023   PLT 204 02/11/2023   Last metabolic panel Lab Results  Component Value Date   GLUCOSE 112 (H) 02/11/2023   NA 136 02/11/2023   K 3.9 02/11/2023   CL 104 02/11/2023   CO2 23 02/11/2023   BUN 12 02/11/2023   CREATININE 0.94 02/11/2023   GFRNONAA >60 02/11/2023   CALCIUM 9.0 02/11/2023   PROT 7.6 02/11/2023   ALBUMIN 3.6 02/11/2023   BILITOT 0.6 02/11/2023  ALKPHOS 85 02/11/2023   AST 22 02/11/2023   ALT 15 02/11/2023   ANIONGAP 9 02/11/2023      Family Communication: No family at bedside  Primary team communication: was made aware about plan for transfer by Dr. Lenard Lance w/ ED team  Thank you very much for involving [place] in the care of your patient.  Author: Floydene Flock, MD 02/11/2023 8:56 AM  For on call review [email or web address].

## 2023-02-11 NOTE — ED Notes (Signed)
Called [name] for possible transfer spoke w/Josh

## 2023-02-11 NOTE — ED Triage Notes (Signed)
Patient ambulatory to triage with steady gait, without difficulty, appears uncomfortable; pt currently receiving radiation for rectal CA; st awoke this morning with abd pain, no other accomp symptoms; colostomy in place, denies any changes in output

## 2023-02-12 ENCOUNTER — Ambulatory Visit: Payer: 59

## 2023-02-12 LAB — CULTURE, BLOOD (SINGLE)

## 2023-02-13 ENCOUNTER — Ambulatory Visit: Payer: 59

## 2023-02-13 ENCOUNTER — Inpatient Hospital Stay: Payer: 59 | Attending: Oncology

## 2023-02-13 ENCOUNTER — Inpatient Hospital Stay (HOSPITAL_BASED_OUTPATIENT_CLINIC_OR_DEPARTMENT_OTHER): Payer: 59 | Admitting: Oncology

## 2023-02-13 ENCOUNTER — Ambulatory Visit: Admission: RE | Admit: 2023-02-13 | Payer: 59 | Source: Ambulatory Visit

## 2023-02-13 ENCOUNTER — Other Ambulatory Visit (HOSPITAL_BASED_OUTPATIENT_CLINIC_OR_DEPARTMENT_OTHER): Payer: Self-pay

## 2023-02-13 ENCOUNTER — Other Ambulatory Visit: Payer: Self-pay

## 2023-02-13 ENCOUNTER — Encounter: Payer: Self-pay | Admitting: Oncology

## 2023-02-13 VITALS — BP 114/96 | HR 104 | Temp 96.3°F | Resp 18 | Wt 165.4 lb

## 2023-02-13 DIAGNOSIS — Z933 Colostomy status: Secondary | ICD-10-CM | POA: Diagnosis not present

## 2023-02-13 DIAGNOSIS — K5900 Constipation, unspecified: Secondary | ICD-10-CM | POA: Diagnosis not present

## 2023-02-13 DIAGNOSIS — K5909 Other constipation: Secondary | ICD-10-CM

## 2023-02-13 DIAGNOSIS — C20 Malignant neoplasm of rectum: Secondary | ICD-10-CM | POA: Diagnosis not present

## 2023-02-13 DIAGNOSIS — R911 Solitary pulmonary nodule: Secondary | ICD-10-CM | POA: Insufficient documentation

## 2023-02-13 DIAGNOSIS — E876 Hypokalemia: Secondary | ICD-10-CM | POA: Diagnosis not present

## 2023-02-13 DIAGNOSIS — Z452 Encounter for adjustment and management of vascular access device: Secondary | ICD-10-CM | POA: Insufficient documentation

## 2023-02-13 DIAGNOSIS — Z87891 Personal history of nicotine dependence: Secondary | ICD-10-CM | POA: Insufficient documentation

## 2023-02-13 DIAGNOSIS — Z923 Personal history of irradiation: Secondary | ICD-10-CM | POA: Insufficient documentation

## 2023-02-13 DIAGNOSIS — Z79899 Other long term (current) drug therapy: Secondary | ICD-10-CM | POA: Diagnosis not present

## 2023-02-13 DIAGNOSIS — Z85048 Personal history of other malignant neoplasm of rectum, rectosigmoid junction, and anus: Secondary | ICD-10-CM | POA: Insufficient documentation

## 2023-02-13 DIAGNOSIS — Z9221 Personal history of antineoplastic chemotherapy: Secondary | ICD-10-CM | POA: Diagnosis not present

## 2023-02-13 LAB — CMP (CANCER CENTER ONLY)
ALT: 14 U/L (ref 0–44)
AST: 18 U/L (ref 15–41)
Albumin: 3.7 g/dL (ref 3.5–5.0)
Alkaline Phosphatase: 76 U/L (ref 38–126)
Anion gap: 9 (ref 5–15)
BUN: 9 mg/dL (ref 6–20)
CO2: 25 mmol/L (ref 22–32)
Calcium: 8.6 mg/dL — ABNORMAL LOW (ref 8.9–10.3)
Chloride: 103 mmol/L (ref 98–111)
Creatinine: 0.91 mg/dL (ref 0.61–1.24)
GFR, Estimated: >60 mL/min (ref >60)
Glucose, Bld: 95 mg/dL (ref 70–99)
Potassium: 3.8 mmol/L (ref 3.5–5.1)
Sodium: 137 mmol/L (ref 135–145)
Total Bilirubin: 0.5 mg/dL (ref 0.3–1.2)
Total Protein: 7.5 g/dL (ref 6.5–8.1)

## 2023-02-13 LAB — CBC WITH DIFFERENTIAL (CANCER CENTER ONLY)
Abs Immature Granulocytes: 0.01 10*3/uL (ref 0.00–0.07)
Basophils Absolute: 0 10*3/uL (ref 0.0–0.1)
Basophils Relative: 0 %
Eosinophils Absolute: 0.2 10*3/uL (ref 0.0–0.5)
Eosinophils Relative: 4 %
HCT: 39.4 % (ref 39.0–52.0)
Hemoglobin: 13.6 g/dL (ref 13.0–17.0)
Immature Granulocytes: 0 %
Lymphocytes Relative: 22 %
Lymphs Abs: 1 10*3/uL (ref 0.7–4.0)
MCH: 30.4 pg (ref 26.0–34.0)
MCHC: 34.5 g/dL (ref 30.0–36.0)
MCV: 88.1 fL (ref 80.0–100.0)
Monocytes Absolute: 0.7 10*3/uL (ref 0.1–1.0)
Monocytes Relative: 14 %
Neutro Abs: 2.8 10*3/uL (ref 1.7–7.7)
Neutrophils Relative %: 60 %
Platelet Count: 213 10*3/uL (ref 150–400)
RBC: 4.47 MIL/uL (ref 4.22–5.81)
RDW: 16.5 % — ABNORMAL HIGH (ref 11.5–15.5)
WBC Count: 4.7 10*3/uL (ref 4.0–10.5)
nRBC: 0 % (ref 0.0–0.2)

## 2023-02-13 MED ORDER — SENNA 8.6 MG PO TABS
2.0000 | ORAL_TABLET | Freq: Every day | ORAL | 0 refills | Status: DC
  Filled 2023-02-13: qty 80, 40d supply, fill #0
  Filled 2023-02-13: qty 100, 50d supply, fill #0

## 2023-02-13 MED ORDER — HEPARIN SOD (PORK) LOCK FLUSH 100 UNIT/ML IV SOLN
500.0000 [IU] | Freq: Once | INTRAVENOUS | Status: AC
  Administered 2023-02-13: 500 [IU] via INTRAVENOUS
  Filled 2023-02-13: qty 5

## 2023-02-13 MED ORDER — SODIUM CHLORIDE 0.9% FLUSH
10.0000 mL | Freq: Once | INTRAVENOUS | Status: AC
  Administered 2023-02-13: 10 mL via INTRAVENOUS
  Filled 2023-02-13: qty 10

## 2023-02-13 NOTE — Assessment & Plan Note (Addendum)
locally advanced rectal cancer-Stage IIIB, s/p TNT neoadjuvant protocol, concurrent Xeloda and radiation-Finished 08/27/2021, s/p  APR,rectal adenocarcinoma, [id number]. Positive radial margin due to perforation. Biopsy proven recurrent rectal cancer, locally advanced disease, possible colovesical fistula; no distant metastatic disease on PET  I have discussed with his [name] Surgeon Dr. Luciano Cutter, recurrence is not resectable.  Recommend combined modality therapy with concurrent Xeloda 825 mg/m2 twice daily  with radiation Labs are reviewed and continue Xeloda 1500mg  BID on treatment days.  Follow up with Radonc for RT, last radiation 6/14

## 2023-02-13 NOTE — Assessment & Plan Note (Signed)
Continue Nicotin patch to 7mg/24 hours 

## 2023-02-13 NOTE — Progress Notes (Signed)
Hematology/Oncology Progress note [phone] [phone]      CHIEF COMPLAINTS/REASON FOR VISIT:  Follow up for recurrent rectal cancer treatment  ASSESSMENT & PLAN:   Cancer Staging  Rectal cancer [facility]) Staging form: Colon and Rectum, AJCC 8th Edition - Clinical stage from 02/08/2021: Stage IIIB (cT4a, cN1, cM0) - Signed by [name], MD on 03/06/2021   Rectal cancer [facility]) locally advanced rectal cancer-Stage IIIB, s/p TNT neoadjuvant protocol, concurrent Xeloda and radiation-Finished 08/27/2021, s/p  APR,rectal adenocarcinoma, ypT3N0. Positive radial margin due to perforation. Biopsy proven recurrent rectal cancer, locally advanced disease, possible colovesical fistula; no distant metastatic disease on PET  I have discussed with his [name] Surgeon Dr. Luciano Cutter, recurrence is not resectable.  Recommend combined modality therapy with concurrent Xeloda 825 mg/m2 twice daily  with radiation Labs are reviewed and continue Xeloda 1500mg  BID on treatment days.  Follow up with Radonc for RT, last radiation 6/14   Lung nodule Previously discussed with IR, CT-guided biopsy is difficult due to the position of small size. Negative on PET evaluation.  Attention on follow-up CT scans.   Hypokalemia Continue potassium supplementation   Former smoker Continue Nicotin patch to 7mg /24 hours  Constipation Recommend Senokot 1-2 tablets daily and Miralax daily PRN    No orders of the defined types were placed in this encounter.   Follow-up 2 weeks All questions were answered. The patient knows to call the clinic with any problems, questions or concerns.  [name], MD, PhD [facility] Hematology Oncology 02/13/2023        HISTORY OF PRESENTING ILLNESS:   Barry Lorenz. is a  57 y.o.  male presents for rectal cancer Oncology History  Rectal cancer (HCC)  02/08/2021 Initial Diagnosis   Rectal cancer   02/05/2021-02/06/2021 patient was hospitalized due to  generalized weakness, intermittent lightheadedness, weight loss and worsening constipation.  02/05/2021 CT abdomen showed concerning of severe rectal wall thickening and right internal iliac lymph node concerning for metastatic disease.  Patient was seen by gastroenterology and had colonoscopy which showed a circumferential fungating mass in the rectum.  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   02/14/2021-02/16/2021 hospitalized due to rectal bleeding and pain.   02/14/2021 CT showed perirectal fluid collection/gas concerning for infection with significant leukocytosis, anemia with hemoglobin of 6.8.  Patient received PRBC transfusion, IV antibiotics.  He underwent an IR guided placement of JP drain into the rectal abscess.  Discharged home with oral Augmentin. 02/20/2021 presented to ER with new skin opening and draining from left gluteus.  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  JP drain has been removed.  Patient was continued on Augmentin.   02/13/2021, PET scan showed locally advanced rectal cancer with billowing of the mesorectum now with low attenuation material that was not present on previous examination with extensive stranding and inflammation.   Bulky RIGHT pelvic sidewall/hypogastric lymph node outside of the mesorectum with stippled calcification measuring 19 mm-no increased metabolic activity. Small LEFT hypogastric lymph node just peripheral to the internal external bifurcation-SUV 3.2 High RIGHT internal just below or at the internal/common iliac transition, lymph node -SUV 4.8 LEFT high hypogastric lymph node  8 mm-SUV 3. Scattered lymph nodes throughout the retroperitoneum with low FDG uptake Bilateral inguinal lymph nodes largest on the RIGHT (image 248/3) 11 mm with a maximum SUV of 2.8 spiculated nodule in the LEFT upper lobe- 9 x 8 mm       02/14/2021, CT abdomen pelvis without contrast Showed perforated  rectal mass with contained perforation with fluid  and gas extending above and below the pelvic floor,potentially involving the sphincter complex and extending into LEFT ischial rectal fossa   02/20/2021, CT pelvis with contrast showed Large perirectal/perianal abscess has markedly decreased in size since placement of the percutaneous drain. There are residual gas-filled collections in the soft tissues and suspect there is a fistula or sinus tract between the rectal mass and the subcutaneous tissues. Soft tissue gas along the medial left buttock and concern for a cutaneous ulceration in this area. Large rectal mass with evidence for a large necrotic right pelvic lymph node.   His case is complicated with a perirectal abscess. Prior to onset of perirectal abscess, on his 02/05/2021 scan, he was noted to have right internal iliac lymph node 3 x 1 x 2.3 cm which is concerning for nodal disease.On Subsequent images it was difficult to distinguish whether lymphadenopathy was due to nodal disease versus acute inflammation. 03/07/2021 MRI pelvis cT3 N2. Due to the possible contained perforation on previous CT, possible cT4 disease.    02/27/2021 medi port placed by Dr.Dew 03/13/2021 reports right butt cheek and also left perianal area fullness.he was seen by Dr.Pabon urgently  and had right buttock abscess drained. Left perianal fullness was felt to be due to cancer.    02/08/2021 Cancer Staging   Staging form: Colon and Rectum, AJCC 8th Edition - Clinical stage from 02/08/2021: Stage IIIB (cT4a, cN1, cM0) - Signed by [name], MD on 03/06/2021 Stage prefix: Initial diagnosis   02/27/2021 Procedure   medi port placed by Dr.Dew   03/18/2021 - 07/05/2021 Chemotherapy    FOLFOX q14d x 4 months      07/17/2021 - 08/27/2021 Chemotherapy   Xeloda concurrent with Radiation   11/18/2021 Surgery   patient is status post open APR with flap for rectal cancer. Pathology rectal adenomacarcinoma ypT3N0.  Positive margin: Radial (circumferential) or mesenteric: adjacent to  perforation      01/27/2022 - 02/02/2022 Hospital Admission   Hospitalized at [facility] due to recurrent abscess.  US guided drain into the pelvic abscess with return of 60 ml of purulent fluid. Cultures positive for staph aureus and strep agalactiae group b, fungal cultures negative.    02/25/2022 Imaging   CT chest angiogram with and without contrast, CT abdomen pelvis with and without contrast 1. Negative for acute pulmonary embolus.2. Emphysema. Further decrease in size of the previously noted irregular nodule in the left upper lobe which is now barely measurable today. No new suspicious lung nodules 3. Status post left lower quadrant colostomy. Further decrease in size since comparison exam from May of the rim enhancing gas and fluid collection at the pelvic surgical bed extending from the perineum superiorly into the pelvis 4. Slightly thickened appearance of terminal ileal small bowel loops in the pelvis with mild stranding suggesting small bowel inflammatory process. 5. Stable enlarged right pelvic sidewall lymph node   06/02/2022 Imaging   CT chest abdomen pelvis w contrast 1. Status post abdominal perineal resection with descending colostomy. 2. At the site of pelvic fluid and gas collection on 02/25/2022, there is residual, decreased presacral soft tissue fullness, without drainable collection. 3. Similar right obturator nodal metastasis.4.  No acute process or evidence of metastatic disease in the chest. 5. Age advanced coronary artery atherosclerosis. Recommend assessment of coronary risk factors. 6. Aortic atherosclerosis and emphysema     06/26/2022 Imaging   CT abdomen pelvis with contrast showed 1. Interval increase in size  of a lobulated partially imaged at least 7.8 x 3.8 x 12 cm abscess in the perianal region in a patient status post abdominal perineal resection and left lower lobe end colostomy formation. Finding extends to involve the pre sacral region up to the urinary dome  level. Associated posterior urinary bladder wall thickening with lack of intraperitoneal fat plane between the presacral soft tissue thickening/abscess formation suggestive of possible fistulization and invasion of the posterior bladder wall. Underlying recurrent malignancy is not excluded. 2. Stable 2.7 cm right pelvic sidewall lymph node. 3.  Aortic Atherosclerosis   06/26/2022 - 06/27/2022 Hospital Admission   He went to [facility] ER and CT showed recurrent abscess. He was transferred to [facility] due to recurrent abscess, treated with IV vanc/zosyn, IR was consulted and drainage tube was placed. Wound grew up Strep viridans. Patient was seen by ID at [facility] discharged with Augmentin for 2 weeks.   He was seen by wound care Dr> Kandace Blitz on 07/09/2022, JP drain was removed.    09/29/2022 Imaging   MRI pelvis without contrast showed  Interval abdominoperitoneal resection since prior MRI. Decreased small fluid collection in the surgical bed compared with more recent CT, consistent with resolving postoperative fluid collection or abscess.   Bulky rounded presacral mass, which shows diffuse restricted diffusion, without significant change in size since most recent CT of 06/26/2022. This raises suspicion for recurrent carcinoma over post treatment changes. Recommend correlation with CEA level, and consider tissue sampling or PET-CT.   Stable enlarged right pelvic sidewall lymph node. No new or increased adenopathy identified.   10/09/2022 Imaging   CT chest with contrast showed 1. Unchanged 0.4 cm fissural nodule of the anterior left lower lobe. This is almost certainly a benign fissural lymph node. No new or suspicious pulmonary nodules. 2. Moderate emphysema and diffuse bilateral bronchial wall thickening. 3. Coronary artery disease.   10/13/2022 Imaging   CT abdomen pelvis with contrast showed 1. Complex collection persists in the lower pelvis, extending from the anal verge upwards into the presacral space  and anteriorly from the presacral space to the bladder dome, not significantly changed in size or extent compared to the earlier CT of 06/26/2022. This is most likely a combination of a postoperative seroma and/or chronic phlegmon/abscess versus recurrent abscess. 2. Bladder walls are thick walled/edematous. Given the contiguity of the presacral collection and the bladder dome, this is highly suspicious for a related bladder wall infection and possibly secondary to colovesical fistula. Recommend correlation with urinalysis. 3. No evidence of bowel obstruction. LEFT lower abdominal wall colostomy, without obstruction or inflammatory change. 4. No free intraperitoneal air   10/13/2022 [facility] Admission   Hospitalized due to fever and rectal pain. Urine culture showed mixed urogenital flora. IR CT guided Aspiration of abscess showed Streptococcal Viridans. He was placed back on Augmentin  10/14/2023 CT read by [facility] radiology Postsurgical changes of abdominoperineal resection. Interval enlargement of  fluid collection in the surgical bed. Multiple foci of enhancing tissue at  the margins of this fluid collection, increased from prior study, suspicious for local recurrence.   Prominent retroperitoneal lymph nodes, some which are increased in size from prior study. These are indeterminate and may be either reactive or represent metastatic disease. Recommend continued attention on follow-up.   Centrally hypoattenuating right obturator lymph node, unchanged in size, consistent with treated disease.   Circumferential bladder wall thickening and irregularity, most pronounced on the left side. This may be reactive in the setting of adjacent pelvic  inflammatory changes. Correlate with urinalysis if there is clinical  concern for urinary tract infection.    11/14/2022 Imaging   MRI pelvis w wo contrast  1. Status post abdominoperineal resection with left lower quadrant end colostomy. 2. Compared to prior CT and  MR, no significant change in appearance of heterogeneous, rim enhancing presacral and low pelvic soft tissue and fluid. Largest heterogeneously enhancing conglomerate of fluid and soft tissue appears to closely involve the anteriorly abutting the seminal vesicles and measures 3.0 x 2.9 cm in largest axial dimension. This extends to superiorly contact the posterior bladder dome and inferiorly towards the gluteal cleft. 3. Discrete fluid component at the most inferior extent measuring 2.4 x 1.3 cm. This is markedly diminished in volume compared to more remote previous examinations, for example 06/26/2022. 4. Constellation of findings is highly concerning for locally recurrent rectal malignancy with or without superimposed infection. Presence or absence of infection at this time is not established by MR. 5. Additional unchanged hemorrhagic or proteinaceous fluid collection in the right hemipelvis measuring 3.2 x 2.6 cm most consistent with postoperative hematoma or seroma. 6. Unchanged thickening of the urinary bladder wall. As previously reported, fistula or involvement of bladder wall by malignancy not excluded.   12/09/2022 Procedure   CT guided biopsy of the pelvic mass showed Moderately differentiated adenocarcinoma with dirty necrosis, morphologically identical to patient's prior rectal adenocarcinoma.   [facility] NGS xT 648 panel showed MLH3 start loss,  BCORL1 frame shift, TP53 splice region varian, APC stop gain,  TMB 7.9, MS stable, KRAS/BRAF/NRAS negative.   [facility] NGS xR showed no gene arrangement or reportable altered splicing events in RNA sequencing      01/07/2023 -  Chemotherapy   Xeloda 825 mg/m2 twice daily  with radiation   02/11/2023 Imaging   CT abdomen pelvis w contrast  1. Examination is positive for small bowel obstruction. Transition point is identified within the left iliac fossa. Distal to the transition point the small bowel loops exhibit mucosal enhancement and wall  thickening concerning for enteritis. 2. There is new right-sided hydronephrosis and hydroureter up to the level of the bifurcation of right common iliac artery. The distal right ureter appears closely associated with the previously characterized tracer avid presacral soft tissue mass. Cannot exclude obstructive uropathy secondary to tumor involvement. 3. Similar appearance of presacral soft tissue mass. This was tracer avid on the recent PET-CT from 12/17/2022, and concerning for locally recurrent tumor. 4. Unchanged diffuse circumferential wall thickening involving the urinary bladder. 5. Aortic Atherosclerosis      Patient presented to emergency room on 12/08/21, rectal pain.  CT scan showed 18.3 cm fluid and gas collection in the pelvic surgical bed compatible with infected collection/abscess.  Patient was sent to [facility] and admitted for.  Patient has CT-guided drain placement on 12/09/2021.  Blood culture positive for Staph epidermidis which was felt to be contaminant.  Fluid culture grew pansensitive Staph aureus, strep pyogenes and candida albicans.  Patient was treated with IV antibiotics and transition to p.o. Augmentin and fluconazole on 12/11/2021.  #Perirectal abscess status post drainage catheter , patient was recently seen by [facility] oncology surgeon on 12/24/2021 and was recommended to keep drainage catheter and continue antibiotics. Patient had a repeat CT scan done which showed a smaller but persistent abscess.  He will return to [facility] surgery for follow-up of drain removal. His case was discussed on [name] tumor board for the positive mesenteric margin which was felt to be secondary to previous  perforation.  Recommend surveillance.  INTERVAL HISTORY Barry Lounds. is a 57 y.o. male who has above history reviewed by me today presents for follow up visit for management of recurrent locally advanced rectal cancer. During the interval patient was admitted due to nausea, CT showed small bowel  obstruction, severe constipation, NG tube was placed. His symptoms improved and has ostomy output, NG was removed and he tolerated PO.   Denies fever chills, rectal pain. Denies rectal discharge.  Denies nausea vomiting or abdominal pain.   He resumes  concurrent chemotherapy with radiation well. No new complaints.    Review of Systems  Constitutional:  Negative for appetite change, chills, fatigue, fever and unexpected weight change.  HENT:   Negative for hearing loss and voice change.   Eyes:  Negative for eye problems and icterus.  Respiratory:  Negative for chest tightness, cough and shortness of breath.   Cardiovascular:  Negative for chest pain and leg swelling.  Gastrointestinal:  Positive for constipation. Negative for abdominal distention, abdominal pain, blood in stool, diarrhea and rectal pain.  Endocrine: Negative for hot flashes.  Genitourinary:  Negative for difficulty urinating, dysuria and frequency.   Musculoskeletal:  Negative for arthralgias.  Skin:  Negative for itching and rash.  Neurological:  Negative for light-headedness and numbness.  Hematological:  Negative for adenopathy. Does not bruise/bleed easily.  Psychiatric/Behavioral:  Negative for confusion.     MEDICAL HISTORY:  Past Medical History:  Diagnosis Date   Headache    Left shoulder pain    Rectal cancer (HCC)     SURGICAL HISTORY: Past Surgical History:  Procedure Laterality Date   COLON SURGERY     COLONOSCOPY WITH PROPOFOL N/A 02/06/2021   Procedure: COLONOSCOPY WITH PROPOFOL;  Surgeon: Toney Reil, MD;  Location: [facility] ENDOSCOPY;  Service: Gastroenterology;  Laterality: N/A;   PORTA CATH INSERTION N/A 02/27/2021   Procedure: PORTA CATH INSERTION;  Surgeon: Annice Needy, MD;  Location: [facility] INVASIVE CV LAB;  Service: Cardiovascular;  Laterality: N/A;   ROTATOR CUFF REPAIR Left     SOCIAL HISTORY: Social History   Socioeconomic History   Marital status: Married    Spouse name:  Not on file   Number of children: Not on file   Years of education: Not on file   Highest education level: Not on file  Occupational History   Not on file  Tobacco Use   Smoking status: Former    Packs/day: 1.00    Years: 10.00    Additional pack years: 0.00    Total pack years: 10.00    Types: Cigars, Cigarettes    Quit date: 02/04/2021    Years since quitting: 2.0   Smokeless tobacco: Never  Substance and Sexual Activity   Alcohol use: Not Currently   Drug use: No   Sexual activity: Not on file  Other Topics Concern   Not on file  Social History Narrative   Not on file   Social Determinants of Health   Financial Resource Strain: Medium Risk (11/03/2022)   Overall Financial Resource Strain (CARDIA)    Difficulty of Paying Living Expenses: Somewhat hard  Food Insecurity: Food Insecurity Present (11/03/2022)   Hunger Vital Sign    Worried About Running Out of Food in the Last Year: Sometimes true    Ran Out of Food in the Last Year: Sometimes true  Transportation Needs: No Transportation Needs (11/03/2022)   PRAPARE - Administrator, Civil Service (Medical): No  Lack of Transportation (Non-Medical): No  Physical Activity: Inactive (11/03/2022)   Exercise Vital Sign    Days of Exercise per Week: 0 days    Minutes of Exercise per Session: 0 min  Stress: Stress Concern Present (11/03/2022)   [employer] of Occupational Health - Occupational Stress Questionnaire    Feeling of Stress : Rather much  Social Connections: Moderately Isolated (11/03/2022)   Social Connection and Isolation Panel [NHANES]    Frequency of Communication with Friends and Family: Twice a week    Frequency of Social Gatherings with Friends and Family: Three times a week    Attends Religious Services: 1 to 4 times per year    Active Member of Clubs or Organizations: No    Attends Banker Meetings: Never    Marital Status: Separated  Intimate Partner Violence: Not At Risk  (11/03/2022)   Humiliation, Afraid, Rape, and Kick questionnaire    Fear of Current or Ex-Partner: No    Emotionally Abused: No    Physically Abused: No    Sexually Abused: No    FAMILY HISTORY: Family History  Problem Relation Age of Onset   Cancer Sister    Diabetes Mother    Cancer Maternal Grandmother    Cancer Paternal Grandmother     ALLERGIES:  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  MEDICATIONS:  Current Outpatient Medications  Medication Sig Dispense Refill   capecitabine (XELODA) 500 MG tablet Take 3 tablets (1,500 mg total) by mouth 2 (two) times daily after a meal. Take Monday thru Friday. Take only on days of radiation. 150 tablet 0   citalopram (CELEXA) 10 MG tablet Take 1 tablet (10 mg total) by mouth daily. 30 tablet 3   lidocaine-prilocaine (EMLA) cream Apply 1 Application topically as needed. Apply small amount to port and cover with saran wrap 1-2 hours prior to port access 30 g 2   nicotine (NICODERM CQ - DOSED IN MG/24 HR) 7 mg/24hr patch Place 1 patch (7 mg total) onto the skin daily. 28 patch 2   ondansetron (ZOFRAN) 8 MG tablet Take 1 tablet (8 mg total) by mouth every 8 (eight) hours as needed for nausea or vomiting. 90 tablet 1   oxyCODONE (OXY IR/ROXICODONE) 5 MG immediate release tablet Take 1 tablet (5 mg total) by mouth every 6 (six) hours as needed for severe pain. 30 tablet 0   prochlorperazine (COMPAZINE) 10 MG tablet Take 1 tablet (10 mg total) by mouth every 6 (six) hours as needed for nausea or vomiting. 90 tablet 1   senna (SENOKOT) 8.6 MG TABS tablet Take 2 tablets (17.2 mg total) by mouth daily. 100 tablet 0   traZODone (DESYREL) 50 MG tablet Take 1 tablet (50 mg total) by mouth at bedtime as needed for sleep. 30 tablet 3   loperamide (IMODIUM) 2 MG capsule Take 2 capsules (4mg ) by mouth initially. Then take 1 capsule by mouth every 2 hours (4mg  every 4 hours at night). Take no more than 16mg /day (Patient not taking: Reported on 01/13/2023) 90 capsule 2    No current facility-administered medications for this visit.   Facility-Administered Medications Ordered in Other Visits  Medication Dose Route Frequency Provider Last Rate Last Admin   heparin lock flush 100 unit/mL  500 Units Intravenous Once [name], MD         PHYSICAL EXAMINATION: ECOG PERFORMANCE STATUS: 1 - Symptomatic but completely ambulatory Vitals:   02/13/23 1013  BP: (!) 114/96  Pulse: (!) 104  Resp:  18  Temp: (!) 96.3 F (35.7 C)     Filed Weights   02/13/23 1013  Weight: 165 lb 6.4 oz (75 kg)      Physical Exam HENT:     Head: Normocephalic and atraumatic.  Eyes:     General: No scleral icterus. Cardiovascular:     Rate and Rhythm: Normal rate and regular rhythm.     Heart sounds: Normal heart sounds.  Pulmonary:     Effort: Pulmonary effort is normal. No respiratory distress.     Breath sounds: No wheezing.     Comments: Decreased breath sound bilaterally.  Abdominal:     General: Bowel sounds are normal. There is no distension.     Palpations: Abdomen is soft.     Comments: Colostomy bag  Genitourinary:    Comments: Status post APR, Musculoskeletal:        General: No deformity. Normal range of motion.     Cervical back: Normal range of motion and neck supple.  Skin:    General: Skin is warm and dry.     Findings: No erythema or rash.  Neurological:     Mental Status: He is alert and oriented to person, place, and time. Mental status is at baseline.     Cranial Nerves: No cranial nerve deficit.     Coordination: Coordination normal.  Psychiatric:        Mood and Affect: Mood normal.     LABORATORY DATA:  I have reviewed the data as listed    Latest Ref Rng & Units 02/13/2023   10:00 AM 02/11/2023    5:11 AM 01/23/2023   10:16 AM  CBC  WBC 4.0 - 10.5 K/uL 4.7  10.3  4.3   Hemoglobin 13.0 - 17.0 g/dL 16.1  09.6  04.5   Hematocrit 39.0 - 52.0 % 39.4  39.5  40.4   Platelets 150 - 400 K/uL 213  204  199       Latest Ref Rng & Units  02/13/2023   10:00 AM 02/11/2023    5:11 AM 01/23/2023   10:16 AM  CMP  Glucose 70 - 99 mg/dL 95  409  811   BUN 6 - 20 mg/dL 9  12  11    Creatinine 0.61 - 1.24 mg/dL 9.14  7.82  9.56   Sodium 135 - 145 mmol/L 137  136  137   Potassium 3.5 - 5.1 mmol/L 3.8  3.9  3.6   Chloride 98 - 111 mmol/L 103  104  104   CO2 22 - 32 mmol/L 25  23  23    Calcium 8.9 - 10.3 mg/dL 8.6  9.0  8.4   Total Protein 6.5 - 8.1 g/dL 7.5  7.6  7.5   Total Bilirubin 0.3 - 1.2 mg/dL 0.5  0.6  0.4   Alkaline Phos 38 - 126 U/L 76  85  71   AST 15 - 41 U/L 18  22  23    ALT 0 - 44 U/L 14  15  19       RADIOGRAPHIC STUDIES: I have personally reviewed the radiological images as listed and agreed with the findings in the report. DG Abdomen 1 View  Result Date: 02/11/2023 CLINICAL DATA:  Nasogastric tube placement. EXAM: ABDOMEN - 1 VIEW COMPARISON:  None Available. FINDINGS: The bowel gas pattern is normal. Distal tip of nasogastric tube is seen in expected position of proximal stomach. Residual contrast is noted in mildly dilated right intrarenal collecting system and  proximal ureter and urinary bladder. IMPRESSION: Distal tip of nasogastric tube is seen in expected position of proximal stomach. Electronically Signed   By: Lupita Raider M.D.   On: 02/11/2023 10:18   CT ABDOMEN PELVIS W CONTRAST  Result Date: 02/11/2023 CLINICAL DATA:  History of rectal cancer. Evaluate for bowel obstruction. * Tracking Code: BO * EXAM: CT ABDOMEN AND PELVIS WITH CONTRAST TECHNIQUE: Multidetector CT imaging of the abdomen and pelvis was performed using the standard protocol following bolus administration of intravenous contrast. RADIATION DOSE REDUCTION: This exam was performed according to the departmental dose-optimization program which includes automated exposure control, adjustment of the mA and/or kV according to patient size and/or use of iterative reconstruction technique. CONTRAST:  OMNIPAQUE IOHEXOL 300 MG/ML  SOLN COMPARISON:   PET-CT 12/17/2022 FINDINGS: Lower chest: No acute abnormality. Hepatobiliary: Unchanged cyst along the inferior margin of the right lobe of liver measuring 1.2 cm. No suspicious liver lesions identified at this time. Gallbladder appears normal without bile duct dilatation. Pancreas: Unremarkable. No pancreatic ductal dilatation or surrounding inflammatory changes. Spleen: Normal in size without focal abnormality. Adrenals/Urinary Tract: Normal adrenal glands. No nephrolithiasis. Several too small to characterize kidney lesions are identified. No follow-up imaging recommended. There is new right-sided hydronephrosis and hydroureter up to the level of the bifurcation of right common iliac artery. The distal right ureter appears closely associated with the presacral soft tissue mass, image 62/2. Normal appearance of the left scratch set no left-sided hydronephrosis. There is diffuse circumferential wall thickening involving the urinary bladder. Stomach/Bowel: Stomach appears normal. Colostomy is identified within the left lower abdominal wall, just left of the midline. The appendix is visualized and appears normal. Within the left hemiabdomen there are several loops of small bowel which exhibit wall thickening, which measures up to 8 mm in thickness, image 60/2. Within the left hemiabdomen and extending into the pelvis there is a dilated loop of mid small bowel with fecalized is a shin. This measures up to 3.2 cm. Transition to decreased caliber distal small bowel noted within the left iliac fossa. The small bowel loops distal to the transition point exhibit mucosal enhancement and wall thickening which measures up to 8 mm in thickness. Normal caliber of the colon up to the level of the ostomy. Vascular/Lymphatic: Aortic atherosclerosis without aneurysm. No pelvic or inguinal adenopathy. Reproductive: No mass. 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 This was tracer avid on the PET-CT from 12/17/2022 measuring  5.7 x 3.9 cm, similar to the previous exam. Within the right ovoid hypoattenuating lesion in the right posterior pelvis (not FDG avid on recent PET-CT) measures 3.3 x 2.6 cm, image 70/2. Previously this measured the same. No significant free fluid or fluid collections. No signs of pneumoperitoneum. Musculoskeletal: No acute or significant osseous findings. IMPRESSION: 1. Examination is positive for small bowel obstruction. Transition point is identified within the left iliac fossa. Distal to the transition point the small bowel loops exhibit mucosal enhancement and wall thickening concerning for enteritis. 2. There is new right-sided hydronephrosis and hydroureter up to the level of the bifurcation of right common iliac artery. The distal right ureter appears closely associated with the previously characterized tracer avid presacral soft tissue mass. Cannot exclude obstructive uropathy secondary to tumor involvement. 3. Similar appearance of presacral soft tissue mass. This was tracer avid on the recent PET-CT from 12/17/2022, and concerning for locally recurrent tumor. 4. Unchanged diffuse circumferential wall thickening involving the urinary bladder. 5. Aortic Atherosclerosis (ICD10-I70.0). Electronically  Signed   By: Signa Kell M.D.   On: 02/11/2023 07:47   NM PET Image Restage (PS) Skull Base to Thigh (F-18 FDG)  Result Date: 12/18/2022 CLINICAL DATA:  Initial treatment strategy for rectal cancer. EXAM: NUCLEAR MEDICINE PET SKULL BASE TO THIGH TECHNIQUE: 8.9 mCi F-18 FDG was injected intravenously. Full-ring PET imaging was performed from the skull base to thigh after the radiotracer. CT data was obtained and used for attenuation correction and anatomic localization. Fasting blood glucose: 85 mg/dl COMPARISON:  MR pelvis 11/14/2022, CT abdomen pelvis 10/13/2022, CT chest 10/09/2022. FINDINGS: Mediastinal blood pool activity: SUV max 2.3 Liver activity: SUV max NA NECK: No hypermetabolic lymph nodes.  Incidental CT findings: None. CHEST: No abnormal hypermetabolism. Incidental CT findings: Right IJ Port-A-Cath terminates near the SVC RA junction. Atherosclerotic calcification of the aorta. Heart size within normal limits. No pericardial or pleural effusion. Centrilobular emphysema. ABDOMEN/PELVIS: Extensive abnormal hypermetabolism associated with soft tissue thickening/mass in the presacral space, status post low anterior resection with SUV max up to 16.1 inferiorly. Given the amorphous nature of the soft tissue, measurement is challenging. Rough axial dimensions are 4.0 x 5.6 cm (4/143). No additional abnormal hypermetabolism. Incidental CT findings: Probable cyst in the posterior right hepatic lobe. Liver, gallbladder and adrenal glands are otherwise unremarkable. Subcentimeter low-attenuation lesion in the right kidney, too small to characterize. No specific follow-up necessary. Visualized portions of the left kidney, spleen, pancreas and stomach are otherwise unremarkable. Left lower quadrant colostomy. SKELETON: No abnormal hypermetabolism. Incidental CT findings: Degenerative changes in the spine. IMPRESSION: 1. FDG avid soft tissue thickening/mass in the surgical bed, status post low anterior section, indicative of disease recurrence. No evidence of metastatic disease. 2.  Aortic atherosclerosis (ICD10-I70.0). 3.  Emphysema (ICD10-J43.9). Electronically Signed   By: Leanna Battles M.D.   On: 12/18/2022 13:31   CT ABDOMINAL MASS BIOPSY  Result Date: 12/09/2022 INDICATION: 57 year old male referred for biopsy of pelvic mass EXAM: CT BIOPSY MEDICATIONS: None. ANESTHESIA/SEDATION: Moderate (conscious) sedation was employed during this procedure. A total of Versed 2.0 mg and Fentanyl 100 mcg was administered intravenously. Moderate Sedation Time: 20 minutes. The patient's level of consciousness and vital signs were monitored continuously by radiology nursing throughout the procedure under my direct  supervision. FLUOROSCOPY TIME:  CT COMPLICATIONS: None PROCEDURE: Informed written consent was obtained from the patient after a thorough discussion of the procedural risks, benefits and alternatives. All questions were addressed. Maximal Sterile Barrier Technique was utilized including caps, mask, sterile gowns, sterile gloves, sterile drape, hand hygiene and skin antiseptic. A timeout was performed prior to the initiation of the procedure. The procedure risks, benefits, and alternatives were explained to the patient. Questions regarding the procedure were encouraged and answered. The patient understands and consents to the procedure. Patient was position prone on the CT gantry table. Scout CT of the pelvis was performed for surgical planning purposes. The posterior pelvis was prepped with Chlorhexidine in a sterile fashion, and a sterile drape was applied covering the operative field. A sterile gown and sterile gloves were used for the procedure. Local anesthesia was provided with 1% Lidocaine. Using CT guidance, introducer needle was advanced into the soft tissue mass at the level of the coccyx. Once we confirmed needle tip position multiple 18 gauge core biopsies were performed with images placed into formalin solution. Needle was removed and a final CT was acquired. Manual pressure was used for hemostasis and a sterile dressing was placed. No complications were encountered no significant blood loss  was encountered. Patient tolerated the procedure well and remained hemodynamically stable throughout. IMPRESSION: Status post CT-guided biopsy of pelvic mass. Signed, Yvone Neu. Miachel Roux, RPVI [facility] Radiology Electronically Signed   By: Gilmer Mor D.O.   On: 12/09/2022 10:10

## 2023-02-13 NOTE — Assessment & Plan Note (Signed)
Continue potassium supplementation 

## 2023-02-13 NOTE — Assessment & Plan Note (Signed)
Previously discussed with IR, CT-guided biopsy is difficult due to the position of small size. Negative on PET evaluation.  Attention on follow-up CT scans.  

## 2023-02-13 NOTE — Assessment & Plan Note (Signed)
Recommend Senokot 1-2 tablets daily and Miralax daily PRN

## 2023-02-13 NOTE — Progress Notes (Signed)
Patient here for follow up. Pt reports that she he went to ER due to severe abdominal pain and constipation. He just got discharged yesterday. He would like to know if Dr. Cathie Hoops would prescribe stool softeners.

## 2023-02-14 LAB — CULTURE, BLOOD (SINGLE)

## 2023-02-16 ENCOUNTER — Ambulatory Visit: Payer: 59

## 2023-02-16 ENCOUNTER — Other Ambulatory Visit: Payer: Self-pay

## 2023-02-16 ENCOUNTER — Ambulatory Visit
Admission: RE | Admit: 2023-02-16 | Discharge: 2023-02-16 | Disposition: A | Discharge status: Home / Self Care | Payer: 59 | Source: Ambulatory Visit | Attending: Radiation Oncology | Admitting: Radiation Oncology

## 2023-02-16 DIAGNOSIS — C20 Malignant neoplasm of rectum: Secondary | ICD-10-CM | POA: Diagnosis not present

## 2023-02-16 DIAGNOSIS — R911 Solitary pulmonary nodule: Secondary | ICD-10-CM | POA: Diagnosis not present

## 2023-02-16 DIAGNOSIS — Z9221 Personal history of antineoplastic chemotherapy: Secondary | ICD-10-CM | POA: Diagnosis not present

## 2023-02-16 DIAGNOSIS — Z510 Encounter for antineoplastic radiation therapy: Secondary | ICD-10-CM | POA: Diagnosis not present

## 2023-02-16 DIAGNOSIS — Z933 Colostomy status: Secondary | ICD-10-CM | POA: Diagnosis not present

## 2023-02-16 LAB — RAD ONC ARIA SESSION SUMMARY
Course Elapsed Days: 39
Plan Fractions Treated to Date: 22
Plan Prescribed Dose Per Fraction: 1.8 Gy
Plan Total Fractions Prescribed: 25
Plan Total Prescribed Dose: 45 Gy
Reference Point Dosage Given to Date: 39.6 Gy
Reference Point Session Dosage Given: 1.8 Gy
Session Number: 22

## 2023-02-16 LAB — CULTURE, BLOOD (SINGLE)

## 2023-02-17 ENCOUNTER — Ambulatory Visit
Admission: RE | Admit: 2023-02-17 | Discharge: 2023-02-17 | Disposition: A | Discharge status: Home / Self Care | Payer: 59 | Source: Ambulatory Visit | Attending: Radiation Oncology | Admitting: Radiation Oncology

## 2023-02-17 ENCOUNTER — Ambulatory Visit: Payer: 59

## 2023-02-17 ENCOUNTER — Other Ambulatory Visit: Payer: Self-pay

## 2023-02-17 DIAGNOSIS — Z9221 Personal history of antineoplastic chemotherapy: Secondary | ICD-10-CM | POA: Diagnosis not present

## 2023-02-17 DIAGNOSIS — Z933 Colostomy status: Secondary | ICD-10-CM | POA: Diagnosis not present

## 2023-02-17 DIAGNOSIS — R911 Solitary pulmonary nodule: Secondary | ICD-10-CM | POA: Diagnosis not present

## 2023-02-17 DIAGNOSIS — Z510 Encounter for antineoplastic radiation therapy: Secondary | ICD-10-CM | POA: Diagnosis not present

## 2023-02-17 DIAGNOSIS — C20 Malignant neoplasm of rectum: Secondary | ICD-10-CM | POA: Diagnosis not present

## 2023-02-17 LAB — RAD ONC ARIA SESSION SUMMARY
Course Elapsed Days: 40
Plan Fractions Treated to Date: 23
Plan Prescribed Dose Per Fraction: 1.8 Gy
Plan Total Fractions Prescribed: 25
Plan Total Prescribed Dose: 45 Gy
Reference Point Dosage Given to Date: 41.4 Gy
Reference Point Session Dosage Given: 1.8 Gy
Session Number: 23

## 2023-02-18 ENCOUNTER — Ambulatory Visit: Payer: 59

## 2023-02-18 ENCOUNTER — Ambulatory Visit
Admission: RE | Admit: 2023-02-18 | Discharge: 2023-02-18 | Disposition: A | Discharge status: Home / Self Care | Payer: 59 | Source: Ambulatory Visit | Attending: Radiation Oncology | Admitting: Radiation Oncology

## 2023-02-18 ENCOUNTER — Other Ambulatory Visit: Payer: Self-pay

## 2023-02-18 DIAGNOSIS — Z933 Colostomy status: Secondary | ICD-10-CM | POA: Diagnosis not present

## 2023-02-18 DIAGNOSIS — C20 Malignant neoplasm of rectum: Secondary | ICD-10-CM | POA: Diagnosis not present

## 2023-02-18 DIAGNOSIS — R911 Solitary pulmonary nodule: Secondary | ICD-10-CM | POA: Diagnosis not present

## 2023-02-18 DIAGNOSIS — Z510 Encounter for antineoplastic radiation therapy: Secondary | ICD-10-CM | POA: Diagnosis not present

## 2023-02-18 DIAGNOSIS — Z9221 Personal history of antineoplastic chemotherapy: Secondary | ICD-10-CM | POA: Diagnosis not present

## 2023-02-18 LAB — RAD ONC ARIA SESSION SUMMARY
Course Elapsed Days: 41
Plan Fractions Treated to Date: 24
Plan Prescribed Dose Per Fraction: 1.8 Gy
Plan Total Fractions Prescribed: 25
Plan Total Prescribed Dose: 45 Gy
Reference Point Dosage Given to Date: 43.2 Gy
Reference Point Session Dosage Given: 1.8 Gy
Session Number: 24

## 2023-02-19 ENCOUNTER — Ambulatory Visit: Payer: 59

## 2023-02-19 ENCOUNTER — Ambulatory Visit
Admission: RE | Admit: 2023-02-19 | Discharge: 2023-02-19 | Disposition: A | Discharge status: Home / Self Care | Payer: 59 | Source: Ambulatory Visit | Attending: Radiation Oncology | Admitting: Radiation Oncology

## 2023-02-19 ENCOUNTER — Other Ambulatory Visit: Payer: Self-pay

## 2023-02-19 DIAGNOSIS — Z9221 Personal history of antineoplastic chemotherapy: Secondary | ICD-10-CM | POA: Diagnosis not present

## 2023-02-19 DIAGNOSIS — C20 Malignant neoplasm of rectum: Secondary | ICD-10-CM | POA: Diagnosis not present

## 2023-02-19 DIAGNOSIS — Z510 Encounter for antineoplastic radiation therapy: Secondary | ICD-10-CM | POA: Diagnosis not present

## 2023-02-19 DIAGNOSIS — Z933 Colostomy status: Secondary | ICD-10-CM | POA: Diagnosis not present

## 2023-02-19 DIAGNOSIS — R911 Solitary pulmonary nodule: Secondary | ICD-10-CM | POA: Diagnosis not present

## 2023-02-19 LAB — RAD ONC ARIA SESSION SUMMARY
Course Elapsed Days: 42
Plan Fractions Treated to Date: 25
Plan Prescribed Dose Per Fraction: 1.8 Gy
Plan Total Fractions Prescribed: 25
Plan Total Prescribed Dose: 45 Gy
Reference Point Dosage Given to Date: 45 Gy
Reference Point Session Dosage Given: 1.8 Gy
Session Number: 25

## 2023-03-02 ENCOUNTER — Encounter (HOSPITAL_COMMUNITY): Payer: Self-pay

## 2023-03-02 ENCOUNTER — Other Ambulatory Visit: Payer: Self-pay

## 2023-03-02 ENCOUNTER — Emergency Department (HOSPITAL_COMMUNITY): Payer: 59

## 2023-03-02 ENCOUNTER — Emergency Department (HOSPITAL_COMMUNITY)
Admission: EM | Admit: 2023-03-02 | Discharge: 2023-03-02 | Disposition: A | Discharge status: Home / Self Care | Payer: 59 | Attending: Emergency Medicine | Admitting: Emergency Medicine

## 2023-03-02 DIAGNOSIS — R112 Nausea with vomiting, unspecified: Secondary | ICD-10-CM | POA: Insufficient documentation

## 2023-03-02 DIAGNOSIS — R000 Tachycardia, unspecified: Secondary | ICD-10-CM | POA: Insufficient documentation

## 2023-03-02 DIAGNOSIS — N289 Disorder of kidney and ureter, unspecified: Secondary | ICD-10-CM | POA: Diagnosis not present

## 2023-03-02 DIAGNOSIS — K520 Gastroenteritis and colitis due to radiation: Secondary | ICD-10-CM

## 2023-03-02 DIAGNOSIS — R1084 Generalized abdominal pain: Secondary | ICD-10-CM | POA: Insufficient documentation

## 2023-03-02 DIAGNOSIS — R1013 Epigastric pain: Secondary | ICD-10-CM | POA: Diagnosis not present

## 2023-03-02 DIAGNOSIS — Z85038 Personal history of other malignant neoplasm of large intestine: Secondary | ICD-10-CM | POA: Insufficient documentation

## 2023-03-02 DIAGNOSIS — Z85048 Personal history of other malignant neoplasm of rectum, rectosigmoid junction, and anus: Secondary | ICD-10-CM | POA: Diagnosis not present

## 2023-03-02 DIAGNOSIS — Z743 Need for continuous supervision: Secondary | ICD-10-CM | POA: Diagnosis not present

## 2023-03-02 DIAGNOSIS — R1111 Vomiting without nausea: Secondary | ICD-10-CM | POA: Diagnosis not present

## 2023-03-02 DIAGNOSIS — R109 Unspecified abdominal pain: Secondary | ICD-10-CM | POA: Diagnosis not present

## 2023-03-02 LAB — URINALYSIS, ROUTINE W REFLEX MICROSCOPIC
Bilirubin Urine: NEGATIVE
Glucose, UA: NEGATIVE mg/dL
Hgb urine dipstick: NEGATIVE
Ketones, ur: NEGATIVE mg/dL
Leukocytes,Ua: NEGATIVE
Nitrite: NEGATIVE
Protein, ur: NEGATIVE mg/dL
Specific Gravity, Urine: >1.046 — ABNORMAL HIGH (ref 1.005–1.030)
pH: 5 (ref 5.0–8.0)

## 2023-03-02 LAB — COMPREHENSIVE METABOLIC PANEL
ALT: 15 U/L (ref 0–44)
AST: 21 U/L (ref 15–41)
Albumin: 3.8 g/dL (ref 3.5–5.0)
Alkaline Phosphatase: 83 U/L (ref 38–126)
Anion gap: 10 (ref 5–15)
BUN: 10 mg/dL (ref 6–20)
CO2: 24 mmol/L (ref 22–32)
Calcium: 9 mg/dL (ref 8.9–10.3)
Chloride: 105 mmol/L (ref 98–111)
Creatinine, Ser: 1.07 mg/dL (ref 0.61–1.24)
GFR, Estimated: >60 mL/min (ref >60)
Glucose, Bld: 127 mg/dL — ABNORMAL HIGH (ref 70–99)
Potassium: 3.5 mmol/L (ref 3.5–5.1)
Sodium: 139 mmol/L (ref 135–145)
Total Bilirubin: 0.7 mg/dL (ref 0.3–1.2)
Total Protein: 7.7 g/dL (ref 6.5–8.1)

## 2023-03-02 LAB — CBC
HCT: 41.2 % (ref 39.0–52.0)
Hemoglobin: 14.4 g/dL (ref 13.0–17.0)
MCH: 31.1 pg (ref 26.0–34.0)
MCHC: 35 g/dL (ref 30.0–36.0)
MCV: 89 fL (ref 80.0–100.0)
Platelets: 211 10*3/uL (ref 150–400)
RBC: 4.63 MIL/uL (ref 4.22–5.81)
RDW: 18 % — ABNORMAL HIGH (ref 11.5–15.5)
WBC: 7.6 10*3/uL (ref 4.0–10.5)
nRBC: 0 % (ref 0.0–0.2)

## 2023-03-02 LAB — LIPASE, BLOOD: Lipase: 24 U/L (ref 11–51)

## 2023-03-02 MED ORDER — SODIUM CHLORIDE 0.9% FLUSH: 10.0000 mL | Freq: Two times a day (BID) | INTRAVENOUS | Status: DC

## 2023-03-02 MED ORDER — IOHEXOL 350 MG/ML SOLN
75.0000 mL | Freq: Once | INTRAVENOUS | Status: AC | PRN
  Administered 2023-03-02: 75 mL via INTRAVENOUS

## 2023-03-02 MED ORDER — OXYCODONE HCL 5 MG PO TABS
5.0000 mg | ORAL_TABLET | Freq: Four times a day (QID) | ORAL | 0 refills | Status: DC | PRN
  Filled 2023-03-02: qty 15, 4d supply, fill #0

## 2023-03-02 MED ORDER — SODIUM CHLORIDE 0.9% FLUSH: 10.0000 mL | INTRAVENOUS | Status: DC | PRN

## 2023-03-02 MED ORDER — HYDROMORPHONE HCL 1 MG/ML IJ SOLN
1.0000 mg | Freq: Once | INTRAMUSCULAR | Status: AC
  Administered 2023-03-02: 1 mg via INTRAVENOUS
  Filled 2023-03-02: qty 1

## 2023-03-02 MED ORDER — ONDANSETRON HCL 4 MG/2ML IJ SOLN
4.0000 mg | Freq: Once | INTRAMUSCULAR | Status: AC
  Administered 2023-03-02: 4 mg via INTRAVENOUS
  Filled 2023-03-02: qty 2

## 2023-03-02 MED ORDER — HEPARIN SOD (PORK) LOCK FLUSH 100 UNIT/ML IV SOLN
500.0000 [IU] | Freq: Once | INTRAVENOUS | Status: DC
  Filled 2023-03-02: qty 5

## 2023-03-02 MED ORDER — SODIUM CHLORIDE 0.9 % IV BOLUS
1000.0000 mL | Freq: Once | INTRAVENOUS | Status: AC
  Administered 2023-03-02: 1000 mL via INTRAVENOUS

## 2023-03-02 MED ORDER — CHLORHEXIDINE GLUCONATE CLOTH 2 % EX PADS: 6.0000 | MEDICATED_PAD | Freq: Every day | CUTANEOUS | Status: DC

## 2023-03-02 MED ORDER — HYDROMORPHONE HCL 1 MG/ML IJ SOLN
0.5000 mg | Freq: Once | INTRAMUSCULAR | Status: AC
  Administered 2023-03-02: 0.5 mg via INTRAVENOUS
  Filled 2023-03-02: qty 1

## 2023-03-02 NOTE — ED Provider Notes (Signed)
[place] EMERGENCY DEPARTMENT AT [facility] Provider Note   CSN: [id number] Arrival date & time: 03/02/23  0345     History  Chief Complaint  Patient presents with   Abdominal Pain   Emesis    Barry Apo. is a 57 y.o. male.  HPI   Patient with medical history including rectal cancer status post surgical resection prostatectomy with colostomy followed by Dr. Cathie Hoops, presenting emerged part with complaints of abdominal pain, states it is epigastric, states it started yesterday but has remained constant, has gotten worse, associated nausea vomiting denies any bloody emesis or coffee-ground emesis, states he is still passing gas and having normal ostomy output, denies any bloody stools or dark tarry stools.  No associated fever chills cough congestion general body aches, states that is likely from the food that he ate earlier today, states it happened after he ate lasagna, states that he has had this pain in the past and it resolved.  He denies any excessive alcohol use NSAID use, denies pain rating to his back, denies any stabbing or tearing like sensation, no associated chest pain shortness of breath.  Reviewed patient's chart recently seen at [facility] for similar presentation, was found to have possible constipation versus bowel obstruction, patient had medical management and resolved on its own.  Home Medications Prior to Admission medications   Medication Sig Start Date End Date Taking? Authorizing Provider  capecitabine (XELODA) 500 MG tablet Take 3 tablets (1,500 mg total) by mouth 2 (two) times daily after a meal. Take Monday thru Friday. Take only on days of radiation. 02/06/23   [name], MD  citalopram (CELEXA) 10 MG tablet Take 1 tablet (10 mg total) by mouth daily. 10/27/22   Borders, Daryl Eastern, NP  lidocaine-prilocaine (EMLA) cream Apply 1 Application topically as needed. Apply small amount to port and cover with saran wrap 1-2 hours prior to port access 01/09/23   [name], MD  loperamide (IMODIUM) 2 MG capsule Take 2 capsules (4mg ) by mouth initially. Then take 1 capsule by mouth every 2 hours (4mg  every 4 hours at night). Take no more than 16mg /day Patient not taking: Reported on 01/13/2023 01/07/23   [name], MD  nicotine (NICODERM CQ - DOSED IN MG/24 HR) 7 mg/24hr patch Place 1 patch (7 mg total) onto the skin daily. 01/07/23   [name], MD  ondansetron (ZOFRAN) 8 MG tablet Take 1 tablet (8 mg total) by mouth every 8 (eight) hours as needed for nausea or vomiting. 01/07/23   [name], MD  oxyCODONE (OXY IR/ROXICODONE) 5 MG immediate release tablet Take 1 tablet (5 mg total) by mouth every 6 (six) hours as needed for severe pain. 01/27/23   [name], MD  prochlorperazine (COMPAZINE) 10 MG tablet Take 1 tablet (10 mg total) by mouth every 6 (six) hours as needed for nausea or vomiting. 01/07/23   [name], MD  senna (SENOKOT) 8.6 MG TABS tablet Take 2 tablets (17.2 mg total) by mouth daily. 02/13/23   [name], MD  traZODone (DESYREL) 50 MG tablet Take 1 tablet (50 mg total) by mouth at bedtime as needed for sleep. 10/27/22   Borders, Daryl Eastern, NP      Allergies    Shellfish allergy    Review of Systems   Review of Systems  Constitutional:  Negative for chills and fever.  Respiratory:  Negative for shortness of breath.   Cardiovascular:  Negative for chest pain.  Gastrointestinal:  Positive  for abdominal pain, nausea and vomiting.  Neurological:  Negative for headaches.    Physical Exam Updated Vital Signs BP (!) 116/102   Pulse (!) 107   Resp 20   SpO2 98%  Physical Exam Vitals and nursing note reviewed.  Constitutional:      General: He is not in acute distress.    Appearance: He is not ill-appearing.  HENT:     Head: Normocephalic and atraumatic.     Nose: No congestion.  Eyes:     Conjunctiva/sclera: Conjunctivae normal.  Cardiovascular:     Rate and Rhythm: Regular rhythm. Tachycardia present.     Pulses: Normal pulses.     Heart sounds: No  murmur heard.    No friction rub. No gallop.  Pulmonary:     Effort: No respiratory distress.     Breath sounds: No wheezing, rhonchi or rales.     Comments: Port-A-Cath noted in the right upper chest no evidence of infection present Abdominal:     Palpations: Abdomen is soft.     Tenderness: There is abdominal tenderness. There is no right CVA tenderness or left CVA tenderness.     Comments: Noted ostomy left lower quadrant, no evidence of infection, good stool production no hematochezia or dark tarry stool, abdomen is nondistended, soft, notable tenderness does not focalized without guarding rebound tenderness or peritoneal sign.  Skin:    General: Skin is warm and dry.  Neurological:     Mental Status: He is alert.  Psychiatric:        Mood and Affect: Mood normal.     ED Results / Procedures / Treatments   Labs (all labs ordered are listed, but only abnormal results are displayed) Labs Reviewed  COMPREHENSIVE METABOLIC PANEL - Abnormal; Notable for the following components:      Result Value   Glucose, Bld 127 (*)    All other components within normal limits  CBC - Abnormal; Notable for the following components:   RDW 18.0 (*)    All other components within normal limits  LIPASE, BLOOD  URINALYSIS, ROUTINE W REFLEX MICROSCOPIC    EKG None  Radiology No results found.  Procedures Procedures    Medications Ordered in ED Medications  sodium chloride flush (NS) 0.9 % injection 10-40 mL (has no administration in time range)  sodium chloride flush (NS) 0.9 % injection 10-40 mL (has no administration in time range)  Chlorhexidine Gluconate Cloth 2 % PADS 6 each (has no administration in time range)  HYDROmorphone (DILAUDID) injection 1 mg (1 mg Intravenous Given 03/02/23 0501)  ondansetron (ZOFRAN) injection 4 mg (4 mg Intravenous Given 03/02/23 0459)  iohexol (OMNIPAQUE) 350 MG/ML injection 75 mL (75 mLs Intravenous Contrast Given 03/02/23 0641)    ED Course/ Medical  Decision Making/ A&P Clinical Course as of 03/02/23 0643  Mon Mar 02, 2023  0637 SP resection with [name]. Sudden onset abdominal pain, yesterday after lasagna intake, hsitory of same, history of SBO with spontaneous resolution. Feels great after dilaudid.  [CG]    Clinical Course User Index [CG] Al Decant, PA-C                             Medical Decision Making Amount and/or Complexity of Data Reviewed Labs: ordered. Radiology: ordered.  Risk OTC drugs. Prescription drug management.   This patient presents to the ED for concern of abdominal pain, this involves an extensive number of treatment options,  and is a complaint that carries with it a high risk of complications and morbidity.  The differential diagnosis includes bowel obstruction, volvulus, gastritis, cholecystitis, pancreatitis    Additional history obtained:  Additional history obtained from N/A External records from outside source obtained and reviewed including recent hospitalization   Co morbidities that complicate the patient evaluation  Colon cancer  Social Determinants of Health:  N/A    Lab Tests:  I Ordered, and personally interpreted labs.  The pertinent results include: CBC unremarkable, CMP reveals glucose of 127, lipase 27   Imaging Studies ordered:  I ordered imaging studies including CT abdomen pelvis I independently visualized and interpreted imaging which showed pending at this time I agree with the radiologist interpretation   Cardiac Monitoring:  The patient was maintained on a cardiac monitor.  I personally viewed and interpreted the cardiac monitored which showed an underlying rhythm of: N/A   Medicines ordered and prescription drug management:  I ordered medication including Dilaudid, antiemetics I have reviewed the patients home medicines and have made adjustments as needed  Critical Interventions:  N/A   Reevaluation:  Presenting with abdominal pain,  unclear etiology, will obtain screening lab workup, follow-up with CT imaging for further assessment.  Patient was reassessed after pain medication, he was found asleep, no longer tachycardic, states he is feeling much better, will continue to monitor.    Consultations Obtained:  N/A    Test Considered:  N/A    Rule out low suspicion for lower lobe pneumonia as lung sounds are clear bilaterally, will defer imaging at this time.  I have low suspicion for liver or gallbladder abnormality as she has no right upper quadrant tenderness, liver enzymes, alk phos, T bili all within normal limits.  Low suspicion for pancreatitis as lipase is within normal limits.  Suspicion for UTI Pilo or kidney stone is low not raising any urinary symptoms, he has no flank tenderness or CVA tenderness.  Suspicion for AAA or dissection is also low presentation atypical, pain is focalized reproduced in the abdomen, does not radiate into the back and endorsing any stabbing or tearing like sensation, has had CT scan in the past without evidence of acute abnormality.    Dispostion and problem list  Due to shift change patient be handed off to Gross PA-C  Follow-up on CT scan, if unremarkable, patient is tolerating p.o., patient can likely be discharged home.            Final Clinical Impression(s) / ED Diagnoses Final diagnoses:  Generalized abdominal pain    Rx / DC Orders ED Discharge Orders     None         Carroll Sage, PA-C 03/02/23 1610    Marily Memos, MD 03/02/23 [phone]

## 2023-03-02 NOTE — ED Notes (Signed)
Patient transported to CT 

## 2023-03-02 NOTE — ED Provider Notes (Signed)
Physical Exam  BP (!) 141/106   Pulse 84   Temp 97.7 F (36.5 C)   Resp 17   SpO2 100%   Physical Exam Vitals and nursing note reviewed.  Constitutional:      General: He is not in acute distress.    Appearance: He is well-developed.  HENT:     Head: Normocephalic and atraumatic.  Eyes:     Conjunctiva/sclera: Conjunctivae normal.  Cardiovascular:     Rate and Rhythm: Normal rate and regular rhythm.     Heart sounds: No murmur heard. Pulmonary:     Effort: Pulmonary effort is normal. No respiratory distress.     Breath sounds: Normal breath sounds.  Abdominal:     Palpations: Abdomen is soft.     Tenderness: There is generalized abdominal tenderness.  Musculoskeletal:        General: No swelling.     Cervical back: Neck supple.  Skin:    General: Skin is warm and dry.     Capillary Refill: Capillary refill takes less than 2 seconds.  Neurological:     Mental Status: He is alert.  Psychiatric:        Mood and Affect: Mood normal.     Procedures  Procedures  ED Course / MDM   Clinical Course as of 03/02/23 1039  Mon Mar 02, 2023  2956 SP resection with [name]. Sudden onset abdominal pain, yesterday after lasagna intake, hsitory of same, history of SBO with spontaneous resolution. Feels great after dilaudid.  [CG]  [id number] Imaging studies suggesting probable SBO. Patient with history of the same not requiring surgery in the past. Patient updated of imaging results and states "this has happened in the past and I take prune juice and some stool softeners and it clears right up". Patient would like to attempt this approach. He denies any nausea or vomiting since coming into ED. States he is still passing flatus, has output into ostomy. Will PO fluid challenge patient. If successful, will dc home with pain control and strict return precautions.  [CG]  1028 Full liquid diet for 2 weeks. Full soft diet. Follow up with [facility]. Bowel regimen with miralax. [CG]    Clinical Course  User Index [CG] Al Decant, PA-C   Medical Decision Making Amount and/or Complexity of Data Reviewed Labs: ordered. Radiology: ordered.  Risk OTC drugs. Prescription drug management.   Patient signed out to me pending imaging study from previous provider.  Please see previous provider note for further details.  In short, this is a 57 year old male status post resection with ostomy followed at [name] who presents to ED with sudden onset of abdominal pain yesterday after ingesting lasagna.  Patient has a history of SBO with spontaneous resolution in the past.  Patient signed out to me pending CT imaging study.  CT imaging study shows probable small bowel obstruction related enteritis.  The patient reports he still has output from ostomy, still passing flatus.  Patient states he would like to be sent home if pain well-controlled.  Additional milligram Dilaudid ordered at this time and will reassess.  Update: Patient reassessed and reports pain decreased at this time.  He is requesting discharge.  I offered the patient admission however he states he would like to be sent home at this time.  Will p.o. fluid challenge patient before discharge.  Update 2: Patient p.o. fluid challenge successful.  Discussed imaging study findings again with patient to confirm that he would like to be sent  home.  The patient states that now he is "concerned about not messing around this" and would like to be admitted.  Chart reviewed from past, patient appears to have had small bowel obstructions in the past that were managed nonoperatively.  Will attempt to admit him to hospitalist for observation and hopeful resolution of SBO.  Update 3: Discussed patient with Dr. Ophelia Charter of [facility] hospitalist as well as Barnetta Chapel, PA-C of general surgery.  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  The patient has had good ostomy output, he is  passing flatus into ostomy.  He denies any pain on reevaluation after receiving 2 and half milligrams Dilaudid.  Tresa Endo states that there is nothing to be done acutely about his complaint.  The patient can follow-up as an outpatient with [name].  Will recommend that he go full liquid diet for 2 weeks until seen by [name] and due to pain we will send him home on pain control but will recommend MiraLAX bowel regimen.  Will recommend close follow-up with [name].  The patient had no nausea or vomiting during his stay here.  The patient is agreeable to this plan and reports that he feels much better at this time.  Strict return precautions were given and the patient voiced understanding.  The patient all the questions answered to his satisfaction.  He is stable to discharge home at this time.      Al Decant, PA-C 03/02/23 1039    Virgina Norfolk, DO 03/02/23 1248

## 2023-03-02 NOTE — ED Triage Notes (Signed)
Pt with hx stage four rectal CA here for eval of intermittent abdominal pain, n/v/d x 1 day. Last radiation tx on 6/14.

## 2023-03-02 NOTE — Discharge Instructions (Signed)
Return to the ED with any new or worsening symptoms such as nausea, vomiting, decreased ostomy output or inability to pass flatus Please begin taking pain control medication I prescribed to your pharmacy.  Please also begin doing bowel regimen with MiraLAX to avoid constipation. Please begin for liquid diet for the next 2 weeks until seen by [name]. Please follow-up with your surgeon at [facility] regarding your radiation-induced enteritis

## 2023-03-03 ENCOUNTER — Other Ambulatory Visit: Payer: Self-pay

## 2023-03-09 ENCOUNTER — Telehealth: Payer: Self-pay

## 2023-03-09 NOTE — Telephone Encounter (Signed)
-----   Message from [name], MD sent at 03/07/2023  8:06 PM EDT ----- Please arrange patient to make a follow up appointment with me. MD only  Thank you   zy

## 2023-03-16 ENCOUNTER — Encounter: Payer: Self-pay | Admitting: Radiation Oncology

## 2023-03-16 ENCOUNTER — Ambulatory Visit
Admission: RE | Admit: 2023-03-16 | Discharge: 2023-03-16 | Disposition: A | Discharge status: Home / Self Care | Payer: 59 | Source: Ambulatory Visit | Attending: Radiation Oncology | Admitting: Radiation Oncology

## 2023-03-16 ENCOUNTER — Inpatient Hospital Stay: Payer: 59 | Attending: Oncology | Admitting: Oncology

## 2023-03-16 ENCOUNTER — Encounter: Payer: Self-pay | Admitting: Oncology

## 2023-03-16 VITALS — BP 123/99 | HR 86 | Temp 97.6°F | Resp 20 | Wt 162.4 lb

## 2023-03-16 DIAGNOSIS — E876 Hypokalemia: Secondary | ICD-10-CM | POA: Diagnosis not present

## 2023-03-16 DIAGNOSIS — Z85048 Personal history of other malignant neoplasm of rectum, rectosigmoid junction, and anus: Secondary | ICD-10-CM | POA: Diagnosis not present

## 2023-03-16 DIAGNOSIS — Z87891 Personal history of nicotine dependence: Secondary | ICD-10-CM

## 2023-03-16 DIAGNOSIS — R911 Solitary pulmonary nodule: Secondary | ICD-10-CM

## 2023-03-16 DIAGNOSIS — K5909 Other constipation: Secondary | ICD-10-CM | POA: Diagnosis not present

## 2023-03-16 DIAGNOSIS — C20 Malignant neoplasm of rectum: Secondary | ICD-10-CM

## 2023-03-16 MED ORDER — SENNA 8.6 MG PO TABS: 2.0000 | ORAL_TABLET | Freq: Every day | ORAL | 0 refills | Status: AC

## 2023-03-16 NOTE — Assessment & Plan Note (Signed)
Recommend Senokot 1-2 tablets daily and Miralax daily PRN 

## 2023-03-16 NOTE — Progress Notes (Signed)
Radiation Oncology Follow up Note  Name: Barry [name].   Date:   03/16/2023 MRN:  [id number] DOB: [birth year]    This 57 y.o. 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  REFERRING PROVIDER: [name], MD  HPI: Patient is a 57 year old male who completed FOLFOX chemotherapy then underwent concurrent chemoradiation therapy in a neoadjuvant fashion for stage IIIb adenocarcinoma of the rectum.  Seen today in follow-up he is doing well he states his bowels are fairly normal he is having no pelvic pain no increased lower urinary tract symptoms.  He is was slated to be seen at [facility] for consideration of surgery although up for transportation reasons he is not been for that evaluation..  He has been seen in the ED for constipation.  That has resolved.  MRI by Dr. Cathie Hoops has been scheduled for the end of July  COMPLICATIONS OF TREATMENT: none  FOLLOW UP COMPLIANCE: keeps appointments   PHYSICAL EXAM:  BP (!) 123/99   Pulse 86   Temp 97.6 F (36.4 C)   Resp 20   Wt 162 lb 6.4 oz (73.7 kg)   SpO2 100%   BMI 24.69 kg/m  Well-developed well-nourished patient in NAD. HEENT reveals PERLA, EOMI, discs not visualized.  Oral cavity is clear. No oral mucosal lesions are identified. Neck is clear without evidence of cervical or supraclavicular adenopathy. Lungs are clear to A&P. Cardiac examination is essentially unremarkable with regular rate and rhythm without murmur rub or thrill. Abdomen is benign with no organomegaly or masses noted. Motor sensory and DTR levels are equal and symmetric in the upper and lower extremities. Cranial nerves II through XII are grossly intact. Proprioception is intact. No peripheral adenopathy or edema is identified. No motor or sensory levels are noted. Crude visual fields are within normal range.  RADIOLOGY RESULTS: MRI has been scheduled for the end of  July  PLAN: At this time patient would like to be referred to [facility] for consideration of GI colorectal surgery evaluation.  Also as an MRI the end of this month which I will review when it becomes available.  Otherwise clinically he is doing well.  I have encouraged him that absolute necessity of seeing surgeon for possible resection.  I have asked to see him back in 4 to 5 months for follow-up.  He continues close follow-up care with medical oncology.  I would like to take this opportunity to thank you for allowing me to participate in the care of your patient.Carmina Miller, MD

## 2023-03-16 NOTE — Progress Notes (Signed)
Pt here for follow up. Pt reports that he saw Dr. Rushie Chestnut this morning and was referred to GI surgery.

## 2023-03-16 NOTE — Progress Notes (Signed)
Hematology/Oncology Progress note [phone] [phone]      CHIEF COMPLAINTS/REASON FOR VISIT:  Follow up for recurrent rectal cancer treatment  ASSESSMENT & PLAN:   Cancer Staging  Rectal cancer [facility]) Staging form: Colon and Rectum, AJCC 8th Edition - Clinical stage from 02/08/2021: Stage IIIB (cT4a, cN1, cM0) - Signed by [name], MD on 03/06/2021   Rectal cancer [facility]) locally advanced rectal cancer-Stage IIIB, s/p TNT neoadjuvant protocol, concurrent Xeloda and radiation-Finished 08/27/2021, s/p  APR,rectal adenocarcinoma, ypT3N0. Positive radial margin due to perforation. Biopsy proven recurrent rectal cancer, locally advanced disease, possible colovesical fistula; no distant metastatic disease on PET- per [name] Surgeon Dr. Luciano Cutter, recurrence is not resectable.  S/p concurrent Xeloda 825 mg/m2 twice daily  with radiation, last radiation 6/14 Obtain MRI pelvis w wo contrast 6 weeks after radiation.  He is not able to follow up with [name] due to transportation barrier.  He is interested in going to [facility] for evaluation. Apparently he has a family member who works at [employer] and can transport him for his visits.  Refer to [facility] On[facility] Dr. Timoteo Gaul for evaluation and to be evaluated by [facility] surgeon for resectability after chemoRad   Lung nodule Previously discussed with IR, CT-guided biopsy is difficult due to the position of small size. Negative on PET evaluation.  Attention on follow-up CT scans.   Hypokalemia Continue potassium supplementation   Constipation Recommend Senokot 1-2 tablets daily and Miralax daily PRN    Orders Placed This Encounter  Procedures   MR PELVIS W WO CONTRAST    Standing Status:   Future    Standing Expiration Date:   03/15/2024    Order Specific Question:   If indicated for the ordered procedure, I authorize the administration of contrast media per Radiology protocol    Answer:   Yes    Order Specific Question:   What is the patient's  sedation requirement?    Answer:   No Sedation    Order Specific Question:   Does the patient have a pacemaker or implanted devices?    Answer:   No    Order Specific Question:   Preferred imaging location?    Answer:   [facility] (table limit - 550lbs)   Ambulatory referral to Hematology / Oncology    Referral Priority:   Routine    Referral Type:   Consultation    Referral Reason:   Specialty Services Required    Requested Specialty:   Oncology    Number of Visits Requested:   1     Follow-up TBD All questions were answered. The patient knows to call the clinic with any problems, questions or concerns.  [name], MD, PhD [facility] Hematology Oncology 03/16/2023        HISTORY OF PRESENTING ILLNESS:   Barry [name]. is a  57 y.o.  male presents for rectal cancer Oncology History  Rectal cancer (HCC)  02/08/2021 Initial Diagnosis   Rectal cancer   02/05/2021-02/06/2021 patient was hospitalized due to generalized weakness, intermittent lightheadedness, weight loss and worsening constipation.  02/05/2021 CT abdomen showed concerning of severe rectal wall thickening and right internal iliac lymph node concerning for metastatic disease.  Patient was seen by gastroenterology and had colonoscopy which showed a circumferential fungating mass in the rectum.  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   02/14/2021-02/16/2021 hospitalized due to rectal bleeding and pain.   02/14/2021 CT showed perirectal fluid collection/gas concerning for infection with significant leukocytosis, anemia  with hemoglobin of 6.8.  Patient received PRBC transfusion, IV antibiotics.  He underwent an IR guided placement of JP drain into the rectal abscess.  Discharged home with oral Augmentin. 02/20/2021 presented to ER with new skin opening and draining from left gluteus.  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  JP drain has been removed.  Patient was continued on Augmentin.    02/13/2021, PET scan showed locally advanced rectal cancer with billowing of the mesorectum now with low attenuation material that was not present on previous examination with extensive stranding and inflammation.   Bulky RIGHT pelvic sidewall/hypogastric lymph node outside of the mesorectum with stippled calcification measuring 19 mm-no increased metabolic activity. Small LEFT hypogastric lymph node just peripheral to the internal external bifurcation-SUV 3.2 High RIGHT internal just below or at the internal/common iliac transition, lymph node -SUV 4.8 LEFT high hypogastric lymph node  8 mm-SUV 3. Scattered lymph nodes throughout the retroperitoneum with low FDG uptake Bilateral inguinal lymph nodes largest on the RIGHT (image 248/3) 11 mm with a maximum SUV of 2.8 spiculated nodule in the LEFT upper lobe- 9 x 8 mm       02/14/2021, CT abdomen pelvis without contrast Showed perforated rectal mass with contained perforation with fluid and gas extending above and below the pelvic floor,potentially involving the sphincter complex and extending into LEFT ischial rectal fossa   02/20/2021, CT pelvis with contrast showed Large perirectal/perianal abscess has markedly decreased in size since placement of the percutaneous drain. There are residual gas-filled collections in the soft tissues and suspect there is a fistula or sinus tract between the rectal mass and the subcutaneous tissues. Soft tissue gas along the medial left buttock and concern for a cutaneous ulceration in this area. Large rectal mass with evidence for a large necrotic right pelvic lymph node.   His case is complicated with a perirectal abscess. Prior to onset of perirectal abscess, on his 02/05/2021 scan, he was noted to have right internal iliac lymph node 3 x 1 x 2.3 cm which is concerning for nodal disease.On Subsequent images it was difficult to distinguish whether lymphadenopathy was due to nodal disease versus acute inflammation.  03/07/2021 MRI pelvis cT3 N2. Due to the possible contained perforation on previous CT, possible cT4 disease.    02/27/2021 medi port placed by Dr.Dew 03/13/2021 reports right butt cheek and also left perianal area fullness.he was seen by Dr.Pabon urgently  and had right buttock abscess drained. Left perianal fullness was felt to be due to cancer.    02/08/2021 Cancer Staging   Staging form: Colon and Rectum, AJCC 8th Edition - Clinical stage from 02/08/2021: Stage IIIB (cT4a, cN1, cM0) - Signed by [name], MD on 03/06/2021 Stage prefix: Initial diagnosis   02/27/2021 Procedure   medi port placed by Dr.Dew   03/18/2021 - 07/05/2021 Chemotherapy    FOLFOX q14d x 4 months      07/17/2021 - 08/27/2021 Chemotherapy   Xeloda concurrent with Radiation   11/18/2021 Surgery   patient is status post open APR with flap for rectal cancer. Pathology rectal adenomacarcinoma ypT3N0.  Positive margin: Radial (circumferential) or mesenteric: adjacent to perforation      01/27/2022 - 02/02/2022 Hospital Admission   Hospitalized at [facility] due to recurrent abscess.  US guided drain into the pelvic abscess with return of 60 ml of purulent fluid. Cultures positive for staph aureus and strep agalactiae group b, fungal cultures negative.    02/25/2022 Imaging   CT chest angiogram with  and without contrast, CT abdomen pelvis with and without contrast 1. Negative for acute pulmonary embolus.2. Emphysema. Further decrease in size of the previously noted irregular nodule in the left upper lobe which is now barely measurable today. No new suspicious lung nodules 3. Status post left lower quadrant colostomy. Further decrease in size since comparison exam from May of the rim enhancing gas and fluid collection at the pelvic surgical bed extending from the perineum superiorly into the pelvis 4. Slightly thickened appearance of terminal ileal small bowel loops in the pelvis with mild stranding suggesting small bowel inflammatory  process. 5. Stable enlarged right pelvic sidewall lymph node   06/02/2022 Imaging   CT chest abdomen pelvis w contrast 1. Status post abdominal perineal resection with descending colostomy. 2. At the site of pelvic fluid and gas collection on 02/25/2022, there is residual, decreased presacral soft tissue fullness, without drainable collection. 3. Similar right obturator nodal metastasis.4.  No acute process or evidence of metastatic disease in the chest. 5. Age advanced coronary artery atherosclerosis. Recommend assessment of coronary risk factors. 6. Aortic atherosclerosis and emphysema     06/26/2022 Imaging   CT abdomen pelvis with contrast showed 1. Interval increase in size of a lobulated partially imaged at least 7.8 x 3.8 x 12 cm abscess in the perianal region in a patient status post abdominal perineal resection and left lower lobe end colostomy formation. Finding extends to involve the pre sacral region up to the urinary dome level. Associated posterior urinary bladder wall thickening with lack of intraperitoneal fat plane between the presacral soft tissue thickening/abscess formation suggestive of possible fistulization and invasion of the posterior bladder wall. Underlying recurrent malignancy is not excluded. 2. Stable 2.7 cm right pelvic sidewall lymph node. 3.  Aortic Atherosclerosis   06/26/2022 - 06/27/2022 Hospital Admission   He went to [facility] ER and CT showed recurrent abscess. He was transferred to [facility] due to recurrent abscess, treated with IV vanc/zosyn, IR was consulted and drainage tube was placed. Wound grew up Strep viridans. Patient was seen by ID at [facility] discharged with Augmentin for 2 weeks.   He was seen by wound care Dr> Kandace Blitz on 07/09/2022, JP drain was removed.    09/29/2022 Imaging   MRI pelvis without contrast showed  Interval abdominoperitoneal resection since prior MRI. Decreased small fluid collection in the surgical bed compared with more recent CT,  consistent with resolving postoperative fluid collection or abscess.   Bulky rounded presacral mass, which shows diffuse restricted diffusion, without significant change in size since most recent CT of 06/26/2022. This raises suspicion for recurrent carcinoma over post treatment changes. Recommend correlation with CEA level, and consider tissue sampling or PET-CT.   Stable enlarged right pelvic sidewall lymph node. No new or increased adenopathy identified.   10/09/2022 Imaging   CT chest with contrast showed 1. Unchanged 0.4 cm fissural nodule of the anterior left lower lobe. This is almost certainly a benign fissural lymph node. No new or suspicious pulmonary nodules. 2. Moderate emphysema and diffuse bilateral bronchial wall thickening. 3. Coronary artery disease.   10/13/2022 Imaging   CT abdomen pelvis with contrast showed 1. Complex collection persists in the lower pelvis, extending from the anal verge upwards into the presacral space and anteriorly from the presacral space to the bladder dome, not significantly changed in size or extent compared to the earlier CT of 06/26/2022. This is most likely a combination of a postoperative seroma and/or chronic phlegmon/abscess versus recurrent abscess. 2. Bladder  walls are thick walled/edematous. Given the contiguity of the presacral collection and the bladder dome, this is highly suspicious for a related bladder wall infection and possibly secondary to colovesical fistula. Recommend correlation with urinalysis. 3. No evidence of bowel obstruction. LEFT lower abdominal wall colostomy, without obstruction or inflammatory change. 4. No free intraperitoneal air   10/13/2022 [facility] Admission   Hospitalized due to fever and rectal pain. Urine culture showed mixed urogenital flora. IR CT guided Aspiration of abscess showed Streptococcal Viridans. He was placed back on Augmentin  10/14/2023 CT read by [facility] radiology Postsurgical changes of abdominoperineal  resection. Interval enlargement of  fluid collection in the surgical bed. Multiple foci of enhancing tissue at  the margins of this fluid collection, increased from prior study, suspicious for local recurrence.   Prominent retroperitoneal lymph nodes, some which are increased in size from prior study. These are indeterminate and may be either reactive or represent metastatic disease. Recommend continued attention on follow-up.   Centrally hypoattenuating right obturator lymph node, unchanged in size, consistent with treated disease.   Circumferential bladder wall thickening and irregularity, most pronounced on the left side. This may be reactive in the setting of adjacent pelvic inflammatory changes. Correlate with urinalysis if there is clinical  concern for urinary tract infection.    11/14/2022 Imaging   MRI pelvis w wo contrast  1. Status post abdominoperineal resection with left lower quadrant end colostomy. 2. Compared to prior CT and MR, no significant change in appearance of heterogeneous, rim enhancing presacral and low pelvic soft tissue and fluid. Largest heterogeneously enhancing conglomerate of fluid and soft tissue appears to closely involve the anteriorly abutting the seminal vesicles and measures 3.0 x 2.9 cm in largest axial dimension. This extends to superiorly contact the posterior bladder dome and inferiorly towards the gluteal cleft. 3. Discrete fluid component at the most inferior extent measuring 2.4 x 1.3 cm. This is markedly diminished in volume compared to more remote previous examinations, for example 06/26/2022. 4. Constellation of findings is highly concerning for locally recurrent rectal malignancy with or without superimposed infection. Presence or absence of infection at this time is not established by MR. 5. Additional unchanged hemorrhagic or proteinaceous fluid collection in the right hemipelvis measuring 3.2 x 2.6 cm most consistent with postoperative hematoma or  seroma. 6. Unchanged thickening of the urinary bladder wall. As previously reported, fistula or involvement of bladder wall by malignancy not excluded.   12/09/2022 Procedure   CT guided biopsy of the pelvic mass showed Moderately differentiated adenocarcinoma with dirty necrosis, morphologically identical to patient's prior rectal adenocarcinoma.   [facility] NGS xT 648 panel showed MLH3 start loss,  BCORL1 frame shift, TP53 splice region varian, APC stop gain,  TMB 7.9, MS stable, KRAS/BRAF/NRAS negative.   [facility] NGS xR showed no gene arrangement or reportable altered splicing events in RNA sequencing      01/07/2023 -  Chemotherapy   Xeloda 825 mg/m2 twice daily  with radiation   02/11/2023 Imaging   CT abdomen pelvis w contrast  1. Examination is positive for small bowel obstruction. Transition point is identified within the left iliac fossa. Distal to the transition point the small bowel loops exhibit mucosal enhancement and wall thickening concerning for enteritis. 2. There is new right-sided hydronephrosis and hydroureter up to the level of the bifurcation of right common iliac artery. The distal right ureter appears closely associated with the previously characterized tracer avid presacral soft tissue mass. Cannot exclude obstructive uropathy  secondary to tumor involvement. 3. Similar appearance of presacral soft tissue mass. This was tracer avid on the recent PET-CT from 12/17/2022, and concerning for locally recurrent tumor. 4. Unchanged diffuse circumferential wall thickening involving the urinary bladder. 5. Aortic Atherosclerosis      Patient presented to emergency room on 12/08/21, rectal pain.  CT scan showed 18.3 cm fluid and gas collection in the pelvic surgical bed compatible with infected collection/abscess.  Patient was sent to [facility] and admitted for.  Patient has CT-guided drain placement on 12/09/2021.  Blood culture positive for Staph epidermidis which was felt to be contaminant.   Fluid culture grew pansensitive Staph aureus, strep pyogenes and candida albicans.  Patient was treated with IV antibiotics and transition to p.o. Augmentin and fluconazole on 12/11/2021.  #Perirectal abscess status post drainage catheter , patient was recently seen by [facility] oncology surgeon on 12/24/2021 and was recommended to keep drainage catheter and continue antibiotics. Patient had a repeat CT scan done which showed a smaller but persistent abscess.  He will return to [facility] surgery for follow-up of drain removal. His case was discussed on [name] tumor board for the positive mesenteric margin which was felt to be secondary to previous perforation.  Recommend surveillance.  02/11/23 patient was admitted due to nausea, CT showed small bowel obstruction, severe constipation, NG tube was placed. His symptoms improved and has ostomy output, NG was removed and he tolerated PO.   INTERVAL HISTORY Nygel Farrior. is a 57 y.o. male who has above history reviewed by me today presents for follow up visit for management of recurrent locally advanced rectal cancer. During the interval ER visit due to abdominal pain.  03/02/23 CT abdomen pelvis w contrast showed . Findings are again suggestive of probable partial small bowel obstruction which appears likely related to enteritis. Specifically, there are multiple areas of mural thickening throughout the distal small bowel with increased mucosal and mural enhancement, with some very mild proximal small bowel dilatation. 2. Status post APR with left lower quadrant colostomy and large soft tissue mass in the low anatomic pelvis which is slightly more prominent than the recent prior study, and was previously hypermetabolic on prior PET-CT 12/17/2022, suggesting residual disease. 3. Probable nodal mass along the right pelvic sidewall in the right internal iliac nodal distribution, similar to the prior study, previously not hypermetabolic on prior PET-CT. No other  lymphadenopathy or definitive signs of metastatic disease noted elsewhere on today's examination.  4. Aortic atherosclerosis, as well as calcified atherosclerotic plaque in the right coronary artery. Please note that although the presence of coronary artery calcium documents the presence of coronary artery disease, the severity of this disease and any potential stenosis cannot be assessed on this non-gated CT examination. Assessment for potential risk factor modification, dietary therapy or pharmacologic therapy may be warranted, if clinically indicated. 5. Additional incidental findings, as above. Patient has radiation induced enteritis. His symptoms were mild he was discharged home.   Today he reports feeling well. He has intermittent diarrhea, alternate with constipation.  Currently he has some loose bowel movements. He has taken 2 doses of imodium and diarrhea has improved.  Denies fever chills, rectal pain. Denies rectal discharge.  Denies nausea vomiting or abdominal pain.      Review of Systems  Constitutional:  Negative for appetite change, chills, fatigue, fever and unexpected weight change.  HENT:   Negative for hearing loss and voice change.   Eyes:  Negative for eye problems and icterus.  Respiratory:  Negative for chest tightness, cough and shortness of breath.   Cardiovascular:  Negative for chest pain and leg swelling.  Gastrointestinal:  Positive for constipation and diarrhea. Negative for abdominal distention, abdominal pain, blood in stool and rectal pain.  Endocrine: Negative for hot flashes.  Genitourinary:  Negative for difficulty urinating, dysuria and frequency.   Musculoskeletal:  Negative for arthralgias.  Skin:  Negative for itching and rash.  Neurological:  Negative for light-headedness and numbness.  Hematological:  Negative for adenopathy. Does not bruise/bleed easily.  Psychiatric/Behavioral:  Negative for confusion.     MEDICAL HISTORY:  Past Medical  History:  Diagnosis Date   Headache    Left shoulder pain    Rectal cancer (HCC)     SURGICAL HISTORY: Past Surgical History:  Procedure Laterality Date   COLON SURGERY     COLONOSCOPY WITH PROPOFOL N/A 02/06/2021   Procedure: COLONOSCOPY WITH PROPOFOL;  Surgeon: Toney Reil, MD;  Location: [facility] ENDOSCOPY;  Service: Gastroenterology;  Laterality: N/A;   PORTA CATH INSERTION N/A 02/27/2021   Procedure: PORTA CATH INSERTION;  Surgeon: Annice Needy, MD;  Location: [facility] INVASIVE CV LAB;  Service: Cardiovascular;  Laterality: N/A;   ROTATOR CUFF REPAIR Left     SOCIAL HISTORY: Social History   Socioeconomic History   Marital status: Married    Spouse name: Not on file   Number of children: Not on file   Years of education: Not on file   Highest education level: Not on file  Occupational History   Not on file  Tobacco Use   Smoking status: Former    Packs/day: 1.00    Years: 10.00    Additional pack years: 0.00    Total pack years: 10.00    Types: Cigars, Cigarettes    Quit date: 02/04/2021    Years since quitting: 2.1   Smokeless tobacco: Never  Substance and Sexual Activity   Alcohol use: Not Currently   Drug use: No   Sexual activity: Not on file  Other Topics Concern   Not on file  Social History Narrative   Not on file   Social Determinants of Health   Financial Resource Strain: Medium Risk (11/03/2022)   Overall Financial Resource Strain (CARDIA)    Difficulty of Paying Living Expenses: Somewhat hard  Food Insecurity: Food Insecurity Present (11/03/2022)   Hunger Vital Sign    Worried About Running Out of Food in the Last Year: Sometimes true    Ran Out of Food in the Last Year: Sometimes true  Transportation Needs: No Transportation Needs (11/03/2022)   PRAPARE - Administrator, Civil Service (Medical): No    Lack of Transportation (Non-Medical): No  Physical Activity: Inactive (11/03/2022)   Exercise Vital Sign    Days of Exercise per  Week: 0 days    Minutes of Exercise per Session: 0 min  Stress: Stress Concern Present (11/03/2022)   [employer] of Occupational Health - Occupational Stress Questionnaire    Feeling of Stress : Rather much  Social Connections: Moderately Isolated (11/03/2022)   Social Connection and Isolation Panel [NHANES]    Frequency of Communication with Friends and Family: Twice a week    Frequency of Social Gatherings with Friends and Family: Three times a week    Attends Religious Services: 1 to 4 times per year    Active Member of Clubs or Organizations: No    Attends Banker Meetings: Never    Marital Status: Separated  Intimate Partner Violence: Not At Risk (11/03/2022)   Humiliation, Afraid, Rape, and Kick questionnaire    Fear of Current or Ex-Partner: No    Emotionally Abused: No    Physically Abused: No    Sexually Abused: No    FAMILY HISTORY: Family History  Problem Relation Age of Onset   Cancer Sister    Diabetes Mother    Cancer Maternal Grandmother    Cancer Paternal Grandmother     ALLERGIES:  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  MEDICATIONS:  Current Outpatient Medications  Medication Sig Dispense Refill   citalopram (CELEXA) 10 MG tablet Take 1 tablet (10 mg total) by mouth daily. 30 tablet 3   lidocaine-prilocaine (EMLA) cream Apply 1 Application topically as needed. Apply small amount to port and cover with saran wrap 1-2 hours prior to port access 30 g 2   loperamide (IMODIUM) 2 MG capsule Take 2 capsules (4mg ) by mouth initially. Then take 1 capsule by mouth every 2 hours (4mg  every 4 hours at night). Take no more than 16mg /day (Patient not taking: Reported on 01/13/2023) 90 capsule 2   nicotine (NICODERM CQ - DOSED IN MG/24 HR) 7 mg/24hr patch Place 1 patch (7 mg total) onto the skin daily. 28 patch 2   ondansetron (ZOFRAN) 8 MG tablet Take 1 tablet (8 mg total) by mouth every 8 (eight) hours as needed for nausea or vomiting. 90 tablet 1   oxyCODONE  (ROXICODONE) 5 MG immediate release tablet Take 1 tablet (5 mg total) by mouth every 6 (six) hours as needed for severe pain. 15 tablet 0   prochlorperazine (COMPAZINE) 10 MG tablet Take 1 tablet (10 mg total) by mouth every 6 (six) hours as needed for nausea or vomiting. 90 tablet 1   senna (SENOKOT) 8.6 MG TABS tablet Take 2 tablets (17.2 mg total) by mouth daily. 100 tablet 0   traZODone (DESYREL) 50 MG tablet Take 1 tablet (50 mg total) by mouth at bedtime as needed for sleep. 30 tablet 3   No current facility-administered medications for this visit.   Facility-Administered Medications Ordered in Other Visits  Medication Dose Route Frequency Provider Last Rate Last Admin   heparin lock flush 100 unit/mL  500 Units Intravenous Once [name], MD         PHYSICAL EXAMINATION: ECOG PERFORMANCE STATUS: 1 - Symptomatic but completely ambulatory Vitals:   03/16/23 1035  BP: (!) 123/99  Pulse: 86  Resp: 20  Temp: 97.6 F (36.4 C)     Filed Weights   03/16/23 1035  Weight: 162 lb 6.4 oz (73.7 kg)      Physical Exam HENT:     Head: Normocephalic and atraumatic.  Eyes:     General: No scleral icterus. Cardiovascular:     Rate and Rhythm: Normal rate and regular rhythm.     Heart sounds: Normal heart sounds.  Pulmonary:     Effort: Pulmonary effort is normal. No respiratory distress.     Breath sounds: No wheezing.     Comments: Decreased breath sound bilaterally.  Abdominal:     General: Bowel sounds are normal. There is no distension.     Palpations: Abdomen is soft.     Comments: Colostomy bag  Genitourinary:    Comments: Status post APR, Musculoskeletal:        General: No deformity. Normal range of motion.     Cervical back: Normal range of motion and neck supple.  Skin:    General: Skin is  warm and dry.     Findings: No erythema or rash.  Neurological:     Mental Status: He is alert and oriented to person, place, and time. Mental status is at baseline.      Cranial Nerves: No cranial nerve deficit.     Coordination: Coordination normal.  Psychiatric:        Mood and Affect: Mood normal.     LABORATORY DATA:  I have reviewed the data as listed    Latest Ref Rng & Units 03/02/2023    4:45 AM 02/13/2023   10:00 AM 02/11/2023    5:11 AM  CBC  WBC 4.0 - 10.5 K/uL 7.6  4.7  10.3   Hemoglobin 13.0 - 17.0 g/dL 16.1  09.6  04.5   Hematocrit 39.0 - 52.0 % 41.2  39.4  39.5   Platelets 150 - 400 K/uL 211  213  204       Latest Ref Rng & Units 03/02/2023    4:45 AM 02/13/2023   10:00 AM 02/11/2023    5:11 AM  CMP  Glucose 70 - 99 mg/dL 409  95  811   BUN 6 - 20 mg/dL 10  9  12    Creatinine 0.61 - 1.24 mg/dL 9.14  7.82  9.56   Sodium 135 - 145 mmol/L 139  137  136   Potassium 3.5 - 5.1 mmol/L 3.5  3.8  3.9   Chloride 98 - 111 mmol/L 105  103  104   CO2 22 - 32 mmol/L 24  25  23    Calcium 8.9 - 10.3 mg/dL 9.0  8.6  9.0   Total Protein 6.5 - 8.1 g/dL 7.7  7.5  7.6   Total Bilirubin 0.3 - 1.2 mg/dL 0.7  0.5  0.6   Alkaline Phos 38 - 126 U/L 83  76  85   AST 15 - 41 U/L 21  18  22    ALT 0 - 44 U/L 15  14  15       RADIOGRAPHIC STUDIES: I have personally reviewed the radiological images as listed and agreed with the findings in the report. CT ABDOMEN PELVIS W CONTRAST  Result Date: 03/02/2023 CLINICAL DATA:  57 year old male with history of acute onset of nonlocalized abdominal pain. History of rectal cancer. * Tracking Code: BO * EXAM: CT ABDOMEN AND PELVIS WITH CONTRAST TECHNIQUE: Multidetector CT imaging of the abdomen and pelvis was performed using the standard protocol following bolus administration of intravenous contrast. RADIATION DOSE REDUCTION: This exam was performed according to the departmental dose-optimization program which includes automated exposure control, adjustment of the mA and/or kV according to patient size and/or use of iterative reconstruction technique. CONTRAST:  75mL OMNIPAQUE IOHEXOL 350 MG/ML SOLN COMPARISON:  CT of the  abdomen and pelvis 02/11/2023. FINDINGS: Lower chest: Atherosclerotic calcifications in the right coronary artery. Hepatobiliary: 1.3 cm partially exophytic low-attenuation lesion associated with segment 6 of the liver (axial image 29 of series 3), similar to the prior study, compatible with a small cyst. No other suspicious appearing hepatic lesions are noted. No intra or extrahepatic biliary ductal dilatation. Gallbladder is unremarkable in appearance. Pancreas: No pancreatic mass. No pancreatic ductal dilatation. No pancreatic or peripancreatic fluid collections or inflammatory changes. Spleen: Unremarkable. Adrenals/Urinary Tract: Subcentimeter low-attenuation lesions in both kidneys, too small to definitively characterize, but statistically likely to represent tiny cysts (no imaging follow-up recommended). No other suspicious appearing renal lesions are noted. No hydroureteronephrosis. Bilateral adrenal glands are normal in appearance. Urinary  bladder wall appears severely thickened, most evident posteriorly. Mild haziness in the perivesical fat. Stomach/Bowel: The appearance of the stomach is unremarkable. There continues to be extensive mural thickening and increased mucosal and mural enhancement throughout portions of the distal small bowel. Proximal loops of small bowel are mildly dilated measuring up to 3.1 cm in diameter. The degree of small-bowel dilatation appears slightly increased compared to the prior study. Some gas and stool is noted in the colon. 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 There is a poorly defined soft tissue mass (previously hypermetabolic on prior PET-CT 12/17/2022) in the low anatomic pelvis which appears intimately associated with the seminal vesicles, and makes contact with the posterior aspect of the urinary bladder wall, similar to the prior study best appreciated on axial image 77 of series 3 and sagittal image 99 of series 7, currently estimated  to measure approximately 5.3 x 4.9 x 7.6 cm. Vascular/Lymphatic: Atherosclerosis in the abdominal aorta and pelvic vasculature, without evidence of aneurysm or dissection. Heterogeneously enhancing nodal mass along the right pelvic sidewall measuring 2.8 x 3.2 cm (axial image 74 of series 3), similar to the recent prior study (and not previously hypermetabolic on prior PET-CT). No other definite lymphadenopathy confidently identified elsewhere in the abdomen or pelvis. Reproductive: Prostate gland and seminal vesicles are distorted by postoperative and postradiation changes in the pelvis, but the previously described pelvic mass is intimately associated with the seminal vesicles. Other: No significant volume of ascites.  No pneumoperitoneum. Musculoskeletal: There are no aggressive appearing lytic or blastic lesions noted in the visualized portions of the skeleton. IMPRESSION: 1. Findings are again suggestive of probable partial small bowel obstruction which appears likely related to enteritis. Specifically, there are multiple areas of mural thickening throughout the distal small bowel with increased mucosal and mural enhancement, with some very mild proximal small bowel dilatation. 2. Status post APR with left lower quadrant colostomy and large soft tissue mass in the low anatomic pelvis which is slightly more prominent than the recent prior study, and was previously hypermetabolic on prior PET-CT 12/17/2022, suggesting residual disease. 3. Probable nodal mass along the right pelvic sidewall in the right internal iliac nodal distribution, similar to the prior study, previously not hypermetabolic on prior PET-CT. No other lymphadenopathy or definitive signs of metastatic disease noted elsewhere on today's examination. 4. Aortic atherosclerosis, as well as calcified atherosclerotic plaque in the right coronary artery. Please note that although the presence of coronary artery calcium documents the presence of coronary  artery disease, the severity of this disease and any potential stenosis cannot be assessed on this non-gated CT examination. Assessment for potential risk factor modification, dietary therapy or pharmacologic therapy may be warranted, if clinically indicated. 5. Additional incidental findings, as above. Electronically Signed   By: Trudie Reed M.D.   On: 03/02/2023 07:01   DG Abdomen 1 View  Result Date: 02/11/2023 CLINICAL DATA:  Nasogastric tube placement. EXAM: ABDOMEN - 1 VIEW COMPARISON:  None Available. FINDINGS: The bowel gas pattern is normal. Distal tip of nasogastric tube is seen in expected position of proximal stomach. Residual contrast is noted in mildly dilated right intrarenal collecting system and proximal ureter and urinary bladder. IMPRESSION: Distal tip of nasogastric tube is seen in expected position of proximal stomach. Electronically Signed   By: Lupita Raider M.D.   On: 02/11/2023 10:18   CT ABDOMEN PELVIS W CONTRAST  Result Date: 02/11/2023 CLINICAL DATA:  History of rectal cancer. Evaluate for  bowel obstruction. * Tracking Code: BO * EXAM: CT ABDOMEN AND PELVIS WITH CONTRAST TECHNIQUE: Multidetector CT imaging of the abdomen and pelvis was performed using the standard protocol following bolus administration of intravenous contrast. RADIATION DOSE REDUCTION: This exam was performed according to the departmental dose-optimization program which includes automated exposure control, adjustment of the mA and/or kV according to patient size and/or use of iterative reconstruction technique. CONTRAST:  OMNIPAQUE IOHEXOL 300 MG/ML  SOLN COMPARISON:  PET-CT 12/17/2022 FINDINGS: Lower chest: No acute abnormality. Hepatobiliary: Unchanged cyst along the inferior margin of the right lobe of liver measuring 1.2 cm. No suspicious liver lesions identified at this time. Gallbladder appears normal without bile duct dilatation. Pancreas: Unremarkable. No pancreatic ductal dilatation or  surrounding inflammatory changes. Spleen: Normal in size without focal abnormality. Adrenals/Urinary Tract: Normal adrenal glands. No nephrolithiasis. Several too small to characterize kidney lesions are identified. No follow-up imaging recommended. There is new right-sided hydronephrosis and hydroureter up to the level of the bifurcation of right common iliac artery. The distal right ureter appears closely associated with the presacral soft tissue mass, image 62/2. Normal appearance of the left scratch set no left-sided hydronephrosis. There is diffuse circumferential wall thickening involving the urinary bladder. Stomach/Bowel: Stomach appears normal. Colostomy is identified within the left lower abdominal wall, just left of the midline. The appendix is visualized and appears normal. Within the left hemiabdomen there are several loops of small bowel which exhibit wall thickening, which measures up to 8 mm in thickness, image 60/2. Within the left hemiabdomen and extending into the pelvis there is a dilated loop of mid small bowel with fecalized is a shin. This measures up to 3.2 cm. Transition to decreased caliber distal small bowel noted within the left iliac fossa. The small bowel loops distal to the transition point exhibit mucosal enhancement and wall thickening which measures up to 8 mm in thickness. Normal caliber of the colon up to the level of the ostomy. Vascular/Lymphatic: Aortic atherosclerosis without aneurysm. No pelvic or inguinal adenopathy. Reproductive: No mass. 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 This was tracer avid on the PET-CT from 12/17/2022 measuring 5.7 x 3.9 cm, similar to the previous exam. Within the right ovoid hypoattenuating lesion in the right posterior pelvis (not FDG avid on recent PET-CT) measures 3.3 x 2.6 cm, image 70/2. Previously this measured the same. No significant free fluid or fluid collections. No signs of pneumoperitoneum. Musculoskeletal: No acute or  significant osseous findings. IMPRESSION: 1. Examination is positive for small bowel obstruction. Transition point is identified within the left iliac fossa. Distal to the transition point the small bowel loops exhibit mucosal enhancement and wall thickening concerning for enteritis. 2. There is new right-sided hydronephrosis and hydroureter up to the level of the bifurcation of right common iliac artery. The distal right ureter appears closely associated with the previously characterized tracer avid presacral soft tissue mass. Cannot exclude obstructive uropathy secondary to tumor involvement. 3. Similar appearance of presacral soft tissue mass. This was tracer avid on the recent PET-CT from 12/17/2022, and concerning for locally recurrent tumor. 4. Unchanged diffuse circumferential wall thickening involving the urinary bladder. 5. Aortic Atherosclerosis (ICD10-I70.0). Electronically Signed   By: Signa Kell M.D.   On: 02/11/2023 07:47   NM PET Image Restage (PS) Skull Base to Thigh (F-18 FDG)  Result Date: 12/18/2022 CLINICAL DATA:  Initial treatment strategy for rectal cancer. EXAM: NUCLEAR MEDICINE PET SKULL BASE TO THIGH TECHNIQUE: 8.9 mCi F-18 FDG was injected  intravenously. Full-ring PET imaging was performed from the skull base to thigh after the radiotracer. CT data was obtained and used for attenuation correction and anatomic localization. Fasting blood glucose: 85 mg/dl COMPARISON:  MR pelvis 11/14/2022, CT abdomen pelvis 10/13/2022, CT chest 10/09/2022. FINDINGS: Mediastinal blood pool activity: SUV max 2.3 Liver activity: SUV max NA NECK: No hypermetabolic lymph nodes. Incidental CT findings: None. CHEST: No abnormal hypermetabolism. Incidental CT findings: Right IJ Port-A-Cath terminates near the SVC RA junction. Atherosclerotic calcification of the aorta. Heart size within normal limits. No pericardial or pleural effusion. Centrilobular emphysema. ABDOMEN/PELVIS: Extensive abnormal  hypermetabolism associated with soft tissue thickening/mass in the presacral space, status post low anterior resection with SUV max up to 16.1 inferiorly. Given the amorphous nature of the soft tissue, measurement is challenging. Rough axial dimensions are 4.0 x 5.6 cm (4/143). No additional abnormal hypermetabolism. Incidental CT findings: Probable cyst in the posterior right hepatic lobe. Liver, gallbladder and adrenal glands are otherwise unremarkable. Subcentimeter low-attenuation lesion in the right kidney, too small to characterize. No specific follow-up necessary. Visualized portions of the left kidney, spleen, pancreas and stomach are otherwise unremarkable. Left lower quadrant colostomy. SKELETON: No abnormal hypermetabolism. Incidental CT findings: Degenerative changes in the spine. IMPRESSION: 1. FDG avid soft tissue thickening/mass in the surgical bed, status post low anterior section, indicative of disease recurrence. No evidence of metastatic disease. 2.  Aortic atherosclerosis (ICD10-I70.0). 3.  Emphysema (ICD10-J43.9). Electronically Signed   By: Leanna Battles M.D.   On: 12/18/2022 13:31

## 2023-03-16 NOTE — Assessment & Plan Note (Signed)
Previously discussed with IR, CT-guided biopsy is difficult due to the position of small size. Negative on PET evaluation.  Attention on follow-up CT scans.  

## 2023-03-16 NOTE — Assessment & Plan Note (Addendum)
locally advanced rectal cancer-Stage IIIB, s/p TNT neoadjuvant protocol, concurrent Xeloda and radiation-Finished 08/27/2021, s/p  APR,rectal adenocarcinoma, ypT3N0. Positive radial margin due to perforation. Biopsy proven recurrent rectal cancer, locally advanced disease, possible colovesical fistula; no distant metastatic disease on PET- per [name] Surgeon Dr. Luciano Cutter, recurrence is not resectable.  S/p concurrent Xeloda 825 mg/m2 twice daily  with radiation, last radiation 6/14 Obtain MRI pelvis w wo contrast 6 weeks after radiation.  He is not able to follow up with [name] due to transportation barrier.  He is interested in going to [facility] for evaluation. Apparently he has a family member who works at [employer] and can transport him for his visits.  Refer to [facility] Oncology Dr. Timoteo Gaul for evaluation and to be evaluated by [facility] surgeon for resectability after chemoRad

## 2023-03-16 NOTE — Assessment & Plan Note (Signed)
Continue potassium supplementation 

## 2023-03-16 NOTE — Assessment & Plan Note (Signed)
Continue Nicotin patch to 7mg/24 hours 

## 2023-03-17 ENCOUNTER — Telehealth: Payer: Self-pay

## 2023-03-17 NOTE — Telephone Encounter (Signed)
Referral to [facility] oncology (Dr. Ardelle Anton) has been faxed.

## 2023-03-18 NOTE — Telephone Encounter (Signed)
Received call from Harriett Sine at Dr. Thomes Lolling office stating that [facility] does not take pt's insurance and they were unable to schedule him.  Per Dr. Cathie Hoops, pt to keep appt with [facility] surgeon. Referral sent Karie Soda (colorectal surgeon) by Dr. Kipp Laurence team.

## 2023-03-23 DIAGNOSIS — C20 Malignant neoplasm of rectum: Secondary | ICD-10-CM | POA: Diagnosis not present

## 2023-03-23 NOTE — Telephone Encounter (Signed)
Pt has been seen by surgeon. Note in Media

## 2023-03-30 ENCOUNTER — Telehealth: Payer: 59 | Admitting: Hospice and Palliative Medicine

## 2023-03-30 ENCOUNTER — Ambulatory Visit
Admission: RE | Admit: 2023-03-30 | Discharge: 2023-03-30 | Disposition: A | Discharge status: Home / Self Care | Payer: 59 | Source: Ambulatory Visit | Attending: Oncology | Admitting: Oncology

## 2023-03-30 DIAGNOSIS — K828 Other specified diseases of gallbladder: Secondary | ICD-10-CM | POA: Diagnosis not present

## 2023-03-30 DIAGNOSIS — N3289 Other specified disorders of bladder: Secondary | ICD-10-CM | POA: Diagnosis not present

## 2023-03-30 DIAGNOSIS — R590 Localized enlarged lymph nodes: Secondary | ICD-10-CM | POA: Diagnosis not present

## 2023-03-30 DIAGNOSIS — C20 Malignant neoplasm of rectum: Secondary | ICD-10-CM | POA: Insufficient documentation

## 2023-03-30 MED ORDER — GADOBUTROL 1 MMOL/ML IV SOLN
7.0000 mL | Freq: Once | INTRAVENOUS | Status: AC | PRN
  Administered 2023-03-30: 7 mL via INTRAVENOUS

## 2023-03-30 MED ORDER — HEPARIN SOD (PORK) LOCK FLUSH 100 UNIT/ML IV SOLN
INTRAVENOUS | Status: AC
  Filled 2023-03-30: qty 5

## 2023-04-08 ENCOUNTER — Other Ambulatory Visit: Payer: Self-pay | Admitting: Oncology

## 2023-04-08 ENCOUNTER — Encounter: Payer: Self-pay | Admitting: Oncology

## 2023-04-08 DIAGNOSIS — C20 Malignant neoplasm of rectum: Secondary | ICD-10-CM

## 2023-04-08 NOTE — Progress Notes (Signed)
DISCONTINUE ON PATHWAY REGIMEN - Colorectal     A cycle is every 14 days:     Oxaliplatin      Leucovorin      Fluorouracil      Fluorouracil   **Always confirm dose/schedule in your pharmacy ordering system**  REASON: Disease Progression PRIOR TREATMENT: ROS56: [id number] q14 Days x 4 Months TREATMENT RESPONSE: Progressive Disease (PD)  START ON PATHWAY REGIMEN - Colorectal     A cycle is every 14 days:     Panitumumab      Irinotecan      Leucovorin      Fluorouracil      Fluorouracil   **Always confirm dose/schedule in your pharmacy ordering system**  Patient Characteristics: Distant Metastases, Nonsurgical Candidate, KRAS/NRAS Wild-Type (BRAF V600 Wild-Type/Unknown), Standard Cytotoxic Therapy, Second Line Standard Cytotoxic Therapy, Bevacizumab Ineligible Tumor Location: Rectal Therapeutic Status: Distant Metastases Microsatellite/Mismatch Repair Status: MSS/pMMR BRAF Mutation Status: Wild-Type (no mutation) KRAS/NRAS Mutation Status: Wild-Type (no mutation) Preferred Therapy Approach: Standard Cytotoxic Therapy Standard Cytotoxic Line of Therapy: Second Line Standard Cytotoxic Therapy Bevacizumab Eligibility: Ineligible Intent of Therapy: Non-Curative / Palliative Intent, Discussed with Patient

## 2023-04-09 ENCOUNTER — Other Ambulatory Visit: Payer: Self-pay | Admitting: Oncology

## 2023-04-09 ENCOUNTER — Telehealth: Payer: Self-pay

## 2023-04-09 NOTE — Telephone Encounter (Signed)
-----   Message from [name] sent at 04/08/2023 11:07 PM EDT ----- Please let patient know that I have reviewed his MRI and recommend starting additional chemotherapy Plan 8/7 lab MD folfiri + panitumumab

## 2023-04-09 NOTE — Telephone Encounter (Signed)
Spoke to pt and gave him appt details.

## 2023-04-09 NOTE — Telephone Encounter (Signed)
Called pt x2 this morning, no answer. Detailed VM left with plan to start tx next week.  Please schedule tx per IS and notify pt of appts.

## 2023-04-13 ENCOUNTER — Encounter: Payer: Self-pay | Admitting: Oncology

## 2023-04-14 ENCOUNTER — Encounter: Payer: Self-pay | Admitting: Oncology

## 2023-04-16 MED FILL — Dexamethasone Sodium Phosphate Inj 100 MG/10ML: INTRAMUSCULAR | Qty: 1 | Status: AC

## 2023-04-17 ENCOUNTER — Inpatient Hospital Stay (HOSPITAL_BASED_OUTPATIENT_CLINIC_OR_DEPARTMENT_OTHER): Payer: 59 | Admitting: Oncology

## 2023-04-17 ENCOUNTER — Other Ambulatory Visit: Payer: Self-pay

## 2023-04-17 ENCOUNTER — Inpatient Hospital Stay: Payer: 59

## 2023-04-17 ENCOUNTER — Encounter: Payer: Self-pay | Admitting: Oncology

## 2023-04-17 ENCOUNTER — Inpatient Hospital Stay: Payer: 59 | Attending: Oncology

## 2023-04-17 VITALS — BP 149/99 | HR 90 | Temp 97.5°F | Resp 18 | Wt 162.1 lb

## 2023-04-17 DIAGNOSIS — C20 Malignant neoplasm of rectum: Secondary | ICD-10-CM

## 2023-04-17 DIAGNOSIS — R911 Solitary pulmonary nodule: Secondary | ICD-10-CM | POA: Diagnosis not present

## 2023-04-17 DIAGNOSIS — Z923 Personal history of irradiation: Secondary | ICD-10-CM | POA: Insufficient documentation

## 2023-04-17 DIAGNOSIS — K6289 Other specified diseases of anus and rectum: Secondary | ICD-10-CM

## 2023-04-17 DIAGNOSIS — Z933 Colostomy status: Secondary | ICD-10-CM | POA: Diagnosis not present

## 2023-04-17 DIAGNOSIS — Z5112 Encounter for antineoplastic immunotherapy: Secondary | ICD-10-CM | POA: Diagnosis not present

## 2023-04-17 DIAGNOSIS — Z5111 Encounter for antineoplastic chemotherapy: Secondary | ICD-10-CM | POA: Insufficient documentation

## 2023-04-17 DIAGNOSIS — Z79899 Other long term (current) drug therapy: Secondary | ICD-10-CM | POA: Insufficient documentation

## 2023-04-17 DIAGNOSIS — E876 Hypokalemia: Secondary | ICD-10-CM | POA: Diagnosis not present

## 2023-04-17 DIAGNOSIS — Z9221 Personal history of antineoplastic chemotherapy: Secondary | ICD-10-CM | POA: Insufficient documentation

## 2023-04-17 DIAGNOSIS — Z87891 Personal history of nicotine dependence: Secondary | ICD-10-CM | POA: Insufficient documentation

## 2023-04-17 LAB — CBC WITH DIFFERENTIAL (CANCER CENTER ONLY)
Abs Immature Granulocytes: 0.02 10*3/uL (ref 0.00–0.07)
Basophils Absolute: 0.1 10*3/uL (ref 0.0–0.1)
Basophils Relative: 1 %
Eosinophils Absolute: 0.2 10*3/uL (ref 0.0–0.5)
Eosinophils Relative: 4 %
HCT: 44 % (ref 39.0–52.0)
Hemoglobin: 15.4 g/dL (ref 13.0–17.0)
Immature Granulocytes: 1 %
Lymphocytes Relative: 24 %
Lymphs Abs: 1 10*3/uL (ref 0.7–4.0)
MCH: 32.5 pg (ref 26.0–34.0)
MCHC: 35 g/dL (ref 30.0–36.0)
MCV: 92.8 fL (ref 80.0–100.0)
Monocytes Absolute: 0.6 10*3/uL (ref 0.1–1.0)
Monocytes Relative: 14 %
Neutro Abs: 2.5 10*3/uL (ref 1.7–7.7)
Neutrophils Relative %: 56 %
Platelet Count: 231 10*3/uL (ref 150–400)
RBC: 4.74 MIL/uL (ref 4.22–5.81)
RDW: 14.7 % (ref 11.5–15.5)
Smear Review: NORMAL
WBC Count: 4.4 10*3/uL (ref 4.0–10.5)
nRBC: 0 % (ref 0.0–0.2)

## 2023-04-17 LAB — CMP (CANCER CENTER ONLY)
ALT: 15 U/L (ref 0–44)
AST: 19 U/L (ref 15–41)
Albumin: 3.9 g/dL (ref 3.5–5.0)
Alkaline Phosphatase: 87 U/L (ref 38–126)
Anion gap: 9 (ref 5–15)
BUN: 8 mg/dL (ref 6–20)
CO2: 21 mmol/L — ABNORMAL LOW (ref 22–32)
Calcium: 8.3 mg/dL — ABNORMAL LOW (ref 8.9–10.3)
Chloride: 106 mmol/L (ref 98–111)
Creatinine: 0.85 mg/dL (ref 0.61–1.24)
GFR, Estimated: >60 mL/min (ref >60)
Glucose, Bld: 140 mg/dL — ABNORMAL HIGH (ref 70–99)
Potassium: 3.2 mmol/L — ABNORMAL LOW (ref 3.5–5.1)
Sodium: 136 mmol/L (ref 135–145)
Total Bilirubin: 0.4 mg/dL (ref 0.3–1.2)
Total Protein: 7.5 g/dL (ref 6.5–8.1)

## 2023-04-17 LAB — MAGNESIUM: Magnesium: 2.1 mg/dL (ref 1.7–2.4)

## 2023-04-17 MED ORDER — ATROPINE SULFATE 1 MG/ML IV SOLN
0.5000 mg | Freq: Once | INTRAVENOUS | Status: AC | PRN
  Administered 2023-04-17: 0.5 mg via INTRAVENOUS
  Filled 2023-04-17: qty 1

## 2023-04-17 MED ORDER — POTASSIUM CHLORIDE CRYS ER 20 MEQ PO TBCR
20.0000 meq | EXTENDED_RELEASE_TABLET | Freq: Every day | ORAL | 0 refills | Status: DC
  Filled 2023-04-17: qty 7, 7d supply, fill #0

## 2023-04-17 MED ORDER — SODIUM CHLORIDE 0.9 % IV SOLN
Freq: Once | INTRAVENOUS | Status: AC
  Filled 2023-04-17: qty 250

## 2023-04-17 MED ORDER — SODIUM CHLORIDE 0.9 % IV SOLN
10.0000 mg | Freq: Once | INTRAVENOUS | Status: AC
  Administered 2023-04-17: 10 mg via INTRAVENOUS
  Filled 2023-04-17: qty 10

## 2023-04-17 MED ORDER — SODIUM CHLORIDE 0.9 % IV SOLN
2400.0000 mg/m2 | INTRAVENOUS | Status: DC
  Administered 2023-04-17: 5000 mg via INTRAVENOUS
  Filled 2023-04-17: qty 100

## 2023-04-17 MED ORDER — SODIUM CHLORIDE 0.9 % IV SOLN
400.0000 mg/m2 | Freq: Once | INTRAVENOUS | Status: AC
  Administered 2023-04-17: 752 mg via INTRAVENOUS
  Filled 2023-04-17: qty 37.6

## 2023-04-17 MED ORDER — SODIUM CHLORIDE 0.9 % IV SOLN
6.0000 mg/kg | Freq: Once | INTRAVENOUS | Status: AC
  Administered 2023-04-17: 440 mg via INTRAVENOUS
  Filled 2023-04-17: qty 2

## 2023-04-17 MED ORDER — FLUOROURACIL CHEMO INJECTION 2.5 GM/50ML
400.0000 mg/m2 | Freq: Once | INTRAVENOUS | Status: AC
  Administered 2023-04-17: 750 mg via INTRAVENOUS
  Filled 2023-04-17: qty 15

## 2023-04-17 MED ORDER — OXYCODONE HCL 5 MG PO TABS
5.0000 mg | ORAL_TABLET | Freq: Four times a day (QID) | ORAL | 0 refills | Status: DC | PRN
  Filled 2023-04-17: qty 90, 12d supply, fill #0

## 2023-04-17 MED ORDER — HEPARIN SOD (PORK) LOCK FLUSH 100 UNIT/ML IV SOLN
500.0000 [IU] | Freq: Once | INTRAVENOUS | Status: DC
  Filled 2023-04-17: qty 5

## 2023-04-17 MED ORDER — SODIUM CHLORIDE 0.9 % IV SOLN
180.0000 mg/m2 | Freq: Once | INTRAVENOUS | Status: AC
  Administered 2023-04-17: 340 mg via INTRAVENOUS
  Filled 2023-04-17: qty 2

## 2023-04-17 MED ORDER — PALONOSETRON HCL INJECTION 0.25 MG/5ML
0.2500 mg | Freq: Once | INTRAVENOUS | Status: AC
  Administered 2023-04-17: 0.25 mg via INTRAVENOUS
  Filled 2023-04-17: qty 5

## 2023-04-17 MED ORDER — SODIUM CHLORIDE 0.9% FLUSH
10.0000 mL | Freq: Once | INTRAVENOUS | Status: AC
  Administered 2023-04-17: 10 mL via INTRAVENOUS
  Filled 2023-04-17: qty 10

## 2023-04-17 MED ORDER — OYSTER SHELL CALCIUM/D3 500-5 MG-MCG PO TABS
2.0000 | ORAL_TABLET | Freq: Every day | ORAL | 3 refills | Status: DC
  Filled 2023-04-17: qty 60, 30d supply, fill #0

## 2023-04-17 NOTE — Patient Instructions (Signed)
[place] [facility]  Discharge Instructions: Thank you for choosing [facility] to provide your oncology and hematology care.  If you have a lab appointment with the Cancer Center, please go directly to the Cancer Center and check in at the registration area.  Wear comfortable clothing and clothing appropriate for easy access to any Portacath or PICC line.   We strive to give you quality time with your provider. You may need to reschedule your appointment if you arrive late (15 or more minutes).  Arriving late affects you and other patients whose appointments are after yours.  Also, if you miss three or more appointments without notifying the office, you may be dismissed from the clinic at the provider's discretion.      For prescription refill requests, have your pharmacy contact our office and allow 72 hours for refills to be completed.    Today you received the following chemotherapy and/or immunotherapy agents Vectibix, Irinotecan, Leucovorin, Adrucil      To help prevent nausea and vomiting after your treatment, we encourage you to take your nausea medication as directed.  BELOW ARE SYMPTOMS THAT SHOULD BE REPORTED IMMEDIATELY: *FEVER GREATER THAN 100.4 F (38 C) OR HIGHER *CHILLS OR SWEATING *NAUSEA AND VOMITING THAT IS NOT CONTROLLED WITH YOUR NAUSEA MEDICATION *UNUSUAL SHORTNESS OF BREATH *UNUSUAL BRUISING OR BLEEDING *URINARY PROBLEMS (pain or burning when urinating, or frequent urination) *BOWEL PROBLEMS (unusual diarrhea, constipation, pain near the anus) TENDERNESS IN MOUTH AND THROAT WITH OR WITHOUT PRESENCE OF ULCERS (sore throat, sores in mouth, or a toothache) UNUSUAL RASH, SWELLING OR PAIN  UNUSUAL VAGINAL DISCHARGE OR ITCHING   Items with * indicate a potential emergency and should be followed up as soon as possible or go to the Emergency Department if any problems should occur.  Please show the CHEMOTHERAPY ALERT CARD or  IMMUNOTHERAPY ALERT CARD at check-in to the Emergency Department and triage nurse.  Should you have questions after your visit or need to cancel or reschedule your appointment, please contact [place] [facility]  [phone] and follow the prompts.  Office hours are 8:00 a.m. to 4:30 p.m. Monday - Friday. Please note that voicemails left after 4:00 p.m. may not be returned until the following business day.  We are closed weekends and major holidays. You have access to a nurse at all times for urgent questions. Please call the main number to the clinic [phone] and follow the prompts.  For any non-urgent questions, you may also contact your provider using MyChart. We now offer e-Visits for anyone 40 and older to request care online for non-urgent symptoms. For details visit [email or web address].   Also download the MyChart app! Go to the app store, search "MyChart", open the app, select [facility], and log in with your MyChart username and password.

## 2023-04-17 NOTE — Assessment & Plan Note (Signed)
Previously discussed with IR, CT-guided biopsy is difficult due to the position of small size. Negative on PET evaluation.  Attention on follow-up CT scans.

## 2023-04-17 NOTE — Progress Notes (Signed)
Hematology/Oncology Progress note [phone] [phone]      CHIEF COMPLAINTS/REASON FOR VISIT:  Follow up for recurrent rectal cancer treatment  ASSESSMENT & PLAN:   Cancer Staging  Rectal cancer [facility]) Staging form: Colon and Rectum, AJCC 8th Edition - Clinical stage from 02/08/2021: Stage IIIB (cT4a, cN1, cM0) - Signed by [name], MD on 03/06/2021   Rectal cancer [facility] locally advanced rectal cancer-Stage IIIB, s/p TNT neoadjuvant protocol, concurrent Xeloda and radiation-Finished 08/27/2021, s/p  APR,rectal adenocarcinoma, ypT3N0. Positive radial margin due to perforation. Biopsy proven recurrent rectal cancer, locally advanced disease, possible colovesical fistula; no distant metastatic disease on PET- per [name] Surgeon Dr. Luciano Cutter, recurrence is not resectable.  S/p concurrent Xeloda 825 mg/m2 twice daily  with radiation, last radiation 6/14 MRI pelvis w wo contrast showed persistent disease. Disease is not resectable per colorectal surgeon  Recommend FOLFIRI + Panitumumab Rationale and side effects were reviewed with patient.     Lung nodule Previously discussed with IR, CT-guided biopsy is difficult due to the position of small size. Negative on PET evaluation.  Attention on follow-up CT scans.   Hypokalemia Continue potassium daily.   Encounter for antineoplastic chemotherapy Chemotherapy plan as listed above  Rectal pain Recommend oxycodone 5-10 mg Q6 hours PRN.  Recommend Fentanyl patch, he declined.     Orders Placed This Encounter  Procedures   CBC with Differential (Cancer Center Only)    Standing Status:   Future    Standing Expiration Date:   04/16/2024   CMP (Cancer Center only)    Standing Status:   Future    Standing Expiration Date:   04/16/2024   CBC with Differential (Cancer Center Only)    Standing Status:   Future    Standing Expiration Date:   05/03/2024   CMP (Cancer Center only)    Standing Status:   Future     Standing Expiration Date:   05/03/2024   Magnesium    Standing Status:   Future    Standing Expiration Date:   05/03/2024     Follow-up 1 week All questions were answered. The patient knows to call the clinic with any problems, questions or concerns.  [name], MD, PhD [facility] Hematology Oncology 04/17/2023        HISTORY OF PRESENTING ILLNESS:   Barry Kitchens. is a  57 y.o.  male presents for rectal cancer Oncology History  Rectal cancer (HCC)  02/08/2021 Initial Diagnosis   Rectal cancer   02/05/2021-02/06/2021 patient was hospitalized due to generalized weakness, intermittent lightheadedness, weight loss and worsening constipation.  02/05/2021 CT abdomen showed concerning of severe rectal wall thickening and right internal iliac lymph node concerning for metastatic disease.  Patient was seen by gastroenterology and had colonoscopy which showed a circumferential fungating mass in the rectum.  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   02/14/2021-02/16/2021 hospitalized due to rectal bleeding and pain.   02/14/2021 CT showed perirectal fluid collection/gas concerning for infection with significant leukocytosis, anemia with hemoglobin of 6.8.  Patient received PRBC transfusion, IV antibiotics.  He underwent an IR guided placement of JP drain into the rectal abscess.  Discharged home with oral Augmentin. 02/20/2021 presented to ER with new skin opening and draining from left gluteus.  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  JP drain has been removed.  Patient was continued on Augmentin.   02/13/2021, PET scan showed locally advanced rectal cancer with billowing of the mesorectum now with low attenuation  material that was not present on previous examination with extensive stranding and inflammation.   Bulky RIGHT pelvic sidewall/hypogastric lymph node outside of the mesorectum with stippled calcification measuring 19 mm-no increased metabolic activity. Small  LEFT hypogastric lymph node just peripheral to the internal external bifurcation-SUV 3.2 High RIGHT internal just below or at the internal/common iliac transition, lymph node -SUV 4.8 LEFT high hypogastric lymph node  8 mm-SUV 3. Scattered lymph nodes throughout the retroperitoneum with low FDG uptake Bilateral inguinal lymph nodes largest on the RIGHT (image 248/3) 11 mm with a maximum SUV of 2.8 spiculated nodule in the LEFT upper lobe- 9 x 8 mm       02/14/2021, CT abdomen pelvis without contrast Showed perforated rectal mass with contained perforation with fluid and gas extending above and below the pelvic floor,potentially involving the sphincter complex and extending into LEFT ischial rectal fossa   02/20/2021, CT pelvis with contrast showed Large perirectal/perianal abscess has markedly decreased in size since placement of the percutaneous drain. There are residual gas-filled collections in the soft tissues and suspect there is a fistula or sinus tract between the rectal mass and the subcutaneous tissues. Soft tissue gas along the medial left buttock and concern for a cutaneous ulceration in this area. Large rectal mass with evidence for a large necrotic right pelvic lymph node.   His case is complicated with a perirectal abscess. Prior to onset of perirectal abscess, on his 02/05/2021 scan, he was noted to have right internal iliac lymph node 3 x 1 x 2.3 cm which is concerning for nodal disease.On Subsequent images it was difficult to distinguish whether lymphadenopathy was due to nodal disease versus acute inflammation. 03/07/2021 MRI pelvis cT3 N2. Due to the possible contained perforation on previous CT, possible cT4 disease.    02/27/2021 medi port placed by Dr.Dew 03/13/2021 reports right butt cheek and also left perianal area fullness.he was seen by Dr.Pabon urgently  and had right buttock abscess drained. Left perianal fullness was felt to be due to cancer.    02/08/2021 Cancer Staging    Staging form: Colon and Rectum, AJCC 8th Edition - Clinical stage from 02/08/2021: Stage IIIB (cT4a, cN1, cM0) - Signed by [name], MD on 03/06/2021 Stage prefix: Initial diagnosis   02/27/2021 Procedure   medi port placed by Dr.Dew   03/18/2021 - 07/05/2021 Chemotherapy    FOLFOX q14d x 4 months      07/17/2021 - 08/27/2021 Chemotherapy   Xeloda concurrent with Radiation   11/18/2021 Surgery   patient is status post open APR with flap for rectal cancer. Pathology rectal adenomacarcinoma ypT3N0.  Positive margin: Radial (circumferential) or mesenteric: adjacent to perforation      01/27/2022 - 02/02/2022 Hospital Admission   Hospitalized at [facility] due to recurrent abscess.  US guided drain into the pelvic abscess with return of 60 ml of purulent fluid. Cultures positive for staph aureus and strep agalactiae group b, fungal cultures negative.    02/25/2022 Imaging   CT chest angiogram with and without contrast, CT abdomen pelvis with and without contrast 1. Negative for acute pulmonary embolus.2. Emphysema. Further decrease in size of the previously noted irregular nodule in the left upper lobe which is now barely measurable today. No new suspicious lung nodules 3. Status post left lower quadrant colostomy. Further decrease in size since comparison exam from May of the rim enhancing gas and fluid collection at the pelvic surgical bed extending from the perineum superiorly into the pelvis 4. Slightly thickened  appearance of terminal ileal small bowel loops in the pelvis with mild stranding suggesting small bowel inflammatory process. 5. Stable enlarged right pelvic sidewall lymph node   06/02/2022 Imaging   CT chest abdomen pelvis w contrast 1. Status post abdominal perineal resection with descending colostomy. 2. At the site of pelvic fluid and gas collection on 02/25/2022, there is residual, decreased presacral soft tissue fullness, without drainable collection. 3. Similar right obturator  nodal metastasis.4.  No acute process or evidence of metastatic disease in the chest. 5. Age advanced coronary artery atherosclerosis. Recommend assessment of coronary risk factors. 6. Aortic atherosclerosis and emphysema     06/26/2022 Imaging   CT abdomen pelvis with contrast showed 1. Interval increase in size of a lobulated partially imaged at least 7.8 x 3.8 x 12 cm abscess in the perianal region in a patient status post abdominal perineal resection and left lower lobe end colostomy formation. Finding extends to involve the pre sacral region up to the urinary dome level. Associated posterior urinary bladder wall thickening with lack of intraperitoneal fat plane between the presacral soft tissue thickening/abscess formation suggestive of possible fistulization and invasion of the posterior bladder wall. Underlying recurrent malignancy is not excluded. 2. Stable 2.7 cm right pelvic sidewall lymph node. 3.  Aortic Atherosclerosis   06/26/2022 - 06/27/2022 Hospital Admission   He went to [facility] ER and CT showed recurrent abscess. He was transferred to [facility] due to recurrent abscess, treated with IV vanc/zosyn, IR was consulted and drainage tube was placed. Wound grew up Strep viridans. Patient was seen by ID at [facility] discharged with Augmentin for 2 weeks.   He was seen by wound care Dr> Kandace Blitz on 07/09/2022, JP drain was removed.    09/29/2022 Imaging   MRI pelvis without contrast showed  Interval abdominoperitoneal resection since prior MRI. Decreased small fluid collection in the surgical bed compared with more recent CT, consistent with resolving postoperative fluid collection or abscess.   Bulky rounded presacral mass, which shows diffuse restricted diffusion, without significant change in size since most recent CT of 06/26/2022. This raises suspicion for recurrent carcinoma over post treatment changes. Recommend correlation with CEA level, and consider tissue sampling or PET-CT.   Stable  enlarged right pelvic sidewall lymph node. No new or increased adenopathy identified.   10/09/2022 Imaging   CT chest with contrast showed 1. Unchanged 0.4 cm fissural nodule of the anterior left lower lobe. This is almost certainly a benign fissural lymph node. No new or suspicious pulmonary nodules. 2. Moderate emphysema and diffuse bilateral bronchial wall thickening. 3. Coronary artery disease.   10/13/2022 Imaging   CT abdomen pelvis with contrast showed 1. Complex collection persists in the lower pelvis, extending from the anal verge upwards into the presacral space and anteriorly from the presacral space to the bladder dome, not significantly changed in size or extent compared to the earlier CT of 06/26/2022. This is most likely a combination of a postoperative seroma and/or chronic phlegmon/abscess versus recurrent abscess. 2. Bladder walls are thick walled/edematous. Given the contiguity of the presacral collection and the bladder dome, this is highly suspicious for a related bladder wall infection and possibly secondary to colovesical fistula. Recommend correlation with urinalysis. 3. No evidence of bowel obstruction. LEFT lower abdominal wall colostomy, without obstruction or inflammatory change. 4. No free intraperitoneal air   10/13/2022 [facility] Admission   Hospitalized due to fever and rectal pain. Urine culture showed mixed urogenital flora. IR CT guided Aspiration of  abscess showed Streptococcal Viridans. He was placed back on Augmentin  10/14/2023 CT read by [facility] radiology Postsurgical changes of abdominoperineal resection. Interval enlargement of  fluid collection in the surgical bed. Multiple foci of enhancing tissue at  the margins of this fluid collection, increased from prior study, suspicious for local recurrence.   Prominent retroperitoneal lymph nodes, some which are increased in size from prior study. These are indeterminate and may be either reactive or represent metastatic  disease. Recommend continued attention on follow-up.   Centrally hypoattenuating right obturator lymph node, unchanged in size, consistent with treated disease.   Circumferential bladder wall thickening and irregularity, most pronounced on the left side. This may be reactive in the setting of adjacent pelvic inflammatory changes. Correlate with urinalysis if there is clinical  concern for urinary tract infection.    11/14/2022 Imaging   MRI pelvis w wo contrast  1. Status post abdominoperineal resection with left lower quadrant end colostomy. 2. Compared to prior CT and MR, no significant change in appearance of heterogeneous, rim enhancing presacral and low pelvic soft tissue and fluid. Largest heterogeneously enhancing conglomerate of fluid and soft tissue appears to closely involve the anteriorly abutting the seminal vesicles and measures 3.0 x 2.9 cm in largest axial dimension. This extends to superiorly contact the posterior bladder dome and inferiorly towards the gluteal cleft. 3. Discrete fluid component at the most inferior extent measuring 2.4 x 1.3 cm. This is markedly diminished in volume compared to more remote previous examinations, for example 06/26/2022. 4. Constellation of findings is highly concerning for locally recurrent rectal malignancy with or without superimposed infection. Presence or absence of infection at this time is not established by MR. 5. Additional unchanged hemorrhagic or proteinaceous fluid collection in the right hemipelvis measuring 3.2 x 2.6 cm most consistent with postoperative hematoma or seroma. 6. Unchanged thickening of the urinary bladder wall. As previously reported, fistula or involvement of bladder wall by malignancy not excluded.   12/09/2022 Procedure   CT guided biopsy of the pelvic mass showed Moderately differentiated adenocarcinoma with dirty necrosis, morphologically identical to patient's prior rectal adenocarcinoma.   [facility] NGS xT 648 panel  showed MLH3 start loss,  BCORL1 frame shift, TP53 splice region varian, APC stop gain,  TMB 7.9, MS stable, KRAS/BRAF/NRAS negative.   [facility] NGS xR showed no gene arrangement or reportable altered splicing events in RNA sequencing      01/07/2023 -  Chemotherapy   Xeloda 825 mg/m2 twice daily  with radiation   02/11/2023 Imaging   CT abdomen pelvis w contrast  1. Examination is positive for small bowel obstruction. Transition point is identified within the left iliac fossa. Distal to the transition point the small bowel loops exhibit mucosal enhancement and wall thickening concerning for enteritis. 2. There is new right-sided hydronephrosis and hydroureter up to the level of the bifurcation of right common iliac artery. The distal right ureter appears closely associated with the previously characterized tracer avid presacral soft tissue mass. Cannot exclude obstructive uropathy secondary to tumor involvement. 3. Similar appearance of presacral soft tissue mass. This was tracer avid on the recent PET-CT from 12/17/2022, and concerning for locally recurrent tumor. 4. Unchanged diffuse circumferential wall thickening involving the urinary bladder. 5. Aortic Atherosclerosis    04/17/2023 -  Chemotherapy   Patient is on Treatment Plan : COLORECTAL FOLFIRI + Panitumumab q14d       Patient presented to emergency room on 12/08/21, rectal pain.  CT scan showed 18.3 cm fluid  and gas collection in the pelvic surgical bed compatible with infected collection/abscess.  Patient was sent to [facility] and admitted for.  Patient has CT-guided drain placement on 12/09/2021.  Blood culture positive for Staph epidermidis which was felt to be contaminant.  Fluid culture grew pansensitive Staph aureus, strep pyogenes and candida albicans.  Patient was treated with IV antibiotics and transition to p.o. Augmentin and fluconazole on 12/11/2021.  #Perirectal abscess status post drainage catheter , patient was recently seen by [facility]  oncology surgeon on 12/24/2021 and was recommended to keep drainage catheter and continue antibiotics. Patient had a repeat CT scan done which showed a smaller but persistent abscess.  He will return to [facility] surgery for follow-up of drain removal. His case was discussed on [name] tumor board for the positive mesenteric margin which was felt to be secondary to previous perforation.  Recommend surveillance.  02/11/23 patient was admitted due to nausea, CT showed small bowel obstruction, severe constipation, NG tube was placed. His symptoms improved and has ostomy output, NG was removed and he tolerated PO.   During the interval ER visit due to abdominal pain.  03/02/23 CT abdomen pelvis w contrast showed . Findings are again suggestive of probable partial small bowel obstruction which appears likely related to enteritis. Specifically, there are multiple areas of mural thickening throughout the distal small bowel with increased mucosal and mural enhancement, with some very mild proximal small bowel dilatation. 2. Status post APR with left lower quadrant colostomy and large soft tissue mass in the low anatomic pelvis which is slightly more prominent than the recent prior study, and was previously hypermetabolic on prior PET-CT 12/17/2022, suggesting residual disease. 3. Probable nodal mass along the right pelvic sidewall in the right internal iliac nodal distribution, similar to the prior study, previously not hypermetabolic on prior PET-CT. No other lymphadenopathy or definitive signs of metastatic disease noted elsewhere on today's examination.  4. Aortic atherosclerosis, as well as calcified atherosclerotic plaque in the right coronary artery. Please note that although the presence of coronary artery calcium documents the presence of coronary artery disease, the severity of this disease and any potential stenosis cannot be assessed on this non-gated CT examination. Assessment for potential risk factor  modification, dietary therapy or pharmacologic therapy may be warranted, if clinically indicated. 5. Additional incidental findings, as above. Patient has radiation induced enteritis. His symptoms were mild he was discharged home.   INTERVAL HISTORY Barry [name]. is a 57 y.o. male who has above history reviewed by me today presents for follow up visit for management of recurrent locally advanced rectal cancer.  + worse of rectal pain since early this week. He took Tylenol which did not help.  Denies fever chills, rectal pain. Denies rectal discharge.  Denies nausea vomiting or abdominal pain.      Review of Systems  Constitutional:  Negative for appetite change, chills, fatigue, fever and unexpected weight change.  HENT:   Negative for hearing loss and voice change.   Eyes:  Negative for eye problems and icterus.  Respiratory:  Negative for chest tightness, cough and shortness of breath.   Cardiovascular:  Negative for chest pain and leg swelling.  Gastrointestinal:  Negative for abdominal distention, abdominal pain, blood in stool, constipation, diarrhea and rectal pain.       Rectal pain  Endocrine: Negative for hot flashes.  Genitourinary:  Negative for difficulty urinating, dysuria and frequency.   Musculoskeletal:  Negative for arthralgias.  Skin:  Negative for itching  and rash.  Neurological:  Negative for light-headedness and numbness.  Hematological:  Negative for adenopathy. Does not bruise/bleed easily.  Psychiatric/Behavioral:  Negative for confusion.     MEDICAL HISTORY:  Past Medical History:  Diagnosis Date   Headache    Left shoulder pain    Rectal cancer (HCC)     SURGICAL HISTORY: Past Surgical History:  Procedure Laterality Date   COLON SURGERY     COLONOSCOPY WITH PROPOFOL N/A 02/06/2021   Procedure: COLONOSCOPY WITH PROPOFOL;  Surgeon: Toney Reil, MD;  Location: [facility] ENDOSCOPY;  Service: Gastroenterology;  Laterality: N/A;   PORTA CATH  INSERTION N/A 02/27/2021   Procedure: PORTA CATH INSERTION;  Surgeon: Annice Needy, MD;  Location: [facility] INVASIVE CV LAB;  Service: Cardiovascular;  Laterality: N/A;   ROTATOR CUFF REPAIR Left     SOCIAL HISTORY: Social History   Socioeconomic History   Marital status: Married    Spouse name: Not on file   Number of children: Not on file   Years of education: Not on file   Highest education level: Not on file  Occupational History   Not on file  Tobacco Use   Smoking status: Former    Current packs/day: 0.00    Average packs/day: 1 pack/day for 10.0 years (10.0 ttl pk-yrs)    Types: Cigars, Cigarettes    Start date: 02/05/2011    Quit date: 02/04/2021    Years since quitting: 2.1   Smokeless tobacco: Never  Substance and Sexual Activity   Alcohol use: Not Currently   Drug use: No   Sexual activity: Not on file  Other Topics Concern   Not on file  Social History Narrative   Not on file   Social Determinants of Health   Financial Resource Strain: High Risk (02/13/2023)   Received from [facility], [facility]   Overall Financial Resource Strain (CARDIA)    Difficulty of Paying Living Expenses: Hard  Food Insecurity: No Food Insecurity (02/13/2023)   Received from [facility], [facility]   Hunger Vital Sign    Worried About Running Out of Food in the Last Year: Never true    Ran Out of Food in the Last Year: Never true  Transportation Needs: No Transportation Needs (02/13/2023)   Received from [facility], [facility]   [facility] - Transportation    In the past 12 months, has lack of transportation kept you from medical appointments or from getting medications?: No    Lack of Transportation (Non-Medical): No  Physical Activity: Inactive (11/03/2022)   Exercise Vital Sign    Days of Exercise per Week: 0 days    Minutes of Exercise per Session: 0 min  Stress: Stress  Concern Present (11/03/2022)   [employer] of Occupational Health - Occupational Stress Questionnaire    Feeling of Stress : Rather much  Social Connections: Moderately Isolated (11/03/2022)   Social Connection and Isolation Panel [NHANES]    Frequency of Communication with Friends and Family: Twice a week    Frequency of Social Gatherings with Friends and Family: Three times a week    Attends Religious Services: 1 to 4 times per year    Active Member of Clubs or Organizations: No    Attends Banker Meetings: Never    Marital Status: Separated  Intimate Partner Violence: Not At Risk (11/03/2022)   Humiliation, Afraid, Rape, and Kick questionnaire    Fear of  Current or Ex-Partner: No    Emotionally Abused: No    Physically Abused: No    Sexually Abused: No    FAMILY HISTORY: Family History  Problem Relation Age of Onset   Cancer Sister    Diabetes Mother    Cancer Maternal Grandmother    Cancer Paternal Grandmother     ALLERGIES:  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  MEDICATIONS:  Current Outpatient Medications  Medication Sig Dispense Refill   calcium-vitamin D (OSCAL WITH D) 500-5 MG-MCG tablet Take 2 tablets by mouth daily. 60 tablet 3   citalopram (CELEXA) 10 MG tablet Take 1 tablet (10 mg total) by mouth daily. 30 tablet 3   lidocaine-prilocaine (EMLA) cream Apply 1 Application topically as needed. Apply small amount to port and cover with saran wrap 1-2 hours prior to port access 30 g 2   nicotine (NICODERM CQ - DOSED IN MG/24 HR) 7 mg/24hr patch Place 1 patch (7 mg total) onto the skin daily. 28 patch 2   ondansetron (ZOFRAN) 8 MG tablet Take 1 tablet (8 mg total) by mouth every 8 (eight) hours as needed for nausea or vomiting. 90 tablet 1   potassium chloride SA (KLOR-CON M) 20 MEQ tablet Take 1 tablet (20 mEq total) by mouth daily. 7 tablet 0   prochlorperazine (COMPAZINE) 10 MG tablet Take 1 tablet (10 mg total) by mouth every 6 (six) hours as needed for  nausea or vomiting. 90 tablet 1   senna (SENOKOT) 8.6 MG TABS tablet Take 2 tablets (17.2 mg total) by mouth daily. 100 tablet 0   traZODone (DESYREL) 50 MG tablet Take 1 tablet (50 mg total) by mouth at bedtime as needed for sleep. 30 tablet 3   loperamide (IMODIUM) 2 MG capsule Take 2 capsules (4mg ) by mouth initially. Then take 1 capsule by mouth every 2 hours (4mg  every 4 hours at night). Take no more than 16mg /day (Patient not taking: Reported on 01/13/2023) 90 capsule 2   oxyCODONE (ROXICODONE) 5 MG immediate release tablet Take 1-2 tablets (5-10 mg total) by mouth every 6 (six) hours as needed for severe pain. 90 tablet 0   No current facility-administered medications for this visit.   Facility-Administered Medications Ordered in Other Visits  Medication Dose Route Frequency Provider Last Rate Last Admin   fluorouracil (ADRUCIL) 5,000 mg in sodium chloride 0.9 % 150 mL chemo infusion  2,400 mg/m2 (Treatment Plan Recorded) Intravenous 1 day or 1 dose [name], MD       fluorouracil (ADRUCIL) chemo injection 750 mg  400 mg/m2 (Treatment Plan Recorded) Intravenous Once [name], MD       heparin lock flush 100 unit/mL  500 Units Intravenous Once [name], MD       heparin lock flush 100 unit/mL  500 Units Intravenous Once [name], MD       irinotecan (CAMPTOSAR) 340 mg in sodium chloride 0.9 % 500 mL chemo infusion  180 mg/m2 (Treatment Plan Recorded) Intravenous Once [name], MD       leucovorin 752 mg in sodium chloride 0.9 % 250 mL infusion  400 mg/m2 (Treatment Plan Recorded) Intravenous Once [name], MD       panitumumab (VECTIBIX) 440 mg in sodium chloride 0.9 % 100 mL chemo infusion  6 mg/kg (Treatment Plan Recorded) Intravenous Once [name], MD         PHYSICAL EXAMINATION: ECOG PERFORMANCE STATUS: 1 - Symptomatic but completely ambulatory Vitals:   04/17/23 0824  BP: [name])  149/99  Pulse: 90  Resp: 18  Temp: (!) 97.5 F (36.4 C)     Filed Weights   04/17/23 0824  Weight:  162 lb 1.6 oz (73.5 kg)      Physical Exam HENT:     Head: Normocephalic and atraumatic.  Eyes:     General: No scleral icterus. Cardiovascular:     Rate and Rhythm: Normal rate and regular rhythm.     Heart sounds: Normal heart sounds.  Pulmonary:     Effort: Pulmonary effort is normal. No respiratory distress.     Breath sounds: No wheezing.     Comments: Decreased breath sound bilaterally.  Abdominal:     General: Bowel sounds are normal. There is no distension.     Palpations: Abdomen is soft.     Comments: Colostomy bag  Genitourinary:    Comments: Status post APR, Musculoskeletal:        General: No deformity. Normal range of motion.     Cervical back: Normal range of motion and neck supple.  Skin:    General: Skin is warm and dry.     Findings: No erythema or rash.  Neurological:     Mental Status: He is alert and oriented to person, place, and time. Mental status is at baseline.     Cranial Nerves: No cranial nerve deficit.     Coordination: Coordination normal.  Psychiatric:        Mood and Affect: Mood normal.     LABORATORY DATA:  I have reviewed the data as listed    Latest Ref Rng & Units 04/17/2023    8:15 AM 03/02/2023    4:45 AM 02/13/2023   10:00 AM  CBC  WBC 4.0 - 10.5 K/uL 4.4  7.6  4.7   Hemoglobin 13.0 - 17.0 g/dL 16.1  09.6  04.5   Hematocrit 39.0 - 52.0 % 44.0  41.2  39.4   Platelets 150 - 400 K/uL 231  211  213       Latest Ref Rng & Units 04/17/2023    8:15 AM 03/02/2023    4:45 AM 02/13/2023   10:00 AM  CMP  Glucose 70 - 99 mg/dL 409  811  95   BUN 6 - 20 mg/dL 8  10  9    Creatinine 0.61 - 1.24 mg/dL 9.14  7.82  9.56   Sodium 135 - 145 mmol/L 136  139  137   Potassium 3.5 - 5.1 mmol/L 3.2  3.5  3.8   Chloride 98 - 111 mmol/L 106  105  103   CO2 22 - 32 mmol/L 21  24  25    Calcium 8.9 - 10.3 mg/dL 8.3  9.0  8.6   Total Protein 6.5 - 8.1 g/dL 7.5  7.7  7.5   Total Bilirubin 0.3 - 1.2 mg/dL 0.4  0.7  0.5   Alkaline Phos 38 - 126 U/L 87   83  76   AST 15 - 41 U/L 19  21  18    ALT 0 - 44 U/L 15  15  14       RADIOGRAPHIC STUDIES: I have personally reviewed the radiological images as listed and agreed with the findings in the report. MR PELVIS W WO CONTRAST  Result Date: 04/07/2023 CLINICAL DATA:  Recurrent rectal carcinoma. Presacral mass. Prior APR EXAM: MRI PELVIS WITHOUT AND WITH CONTRAST TECHNIQUE: Multiplanar multisequence MR imaging of the pelvis was performed both before and after administration of intravenous contrast. CONTRAST:  7mL GADAVIST GADOBUTROL 1 MMOL/ML IV SOLN COMPARISON:  CT on 03/02/2023 and MRI on 11/14/2022 FINDINGS: Lower Urinary Tract: Diffuse bladder wall thickening is stable, and may be due to previous radiation therapy or cystitis. Bowel: Postop changes from previous APR. Poorly defined and rounded masslike lesion is seen in the region of the Hartmann's pouch which shows peripheral nodular enhancement. This measures 5.0 x 3.8 cm on image 51/22, without significant change compared to most recent CT, but increased from earlier MRI when it measured approximately 3.0 x 2.9 cm. This is highly suspicious for recurrent rectal carcinoma. Vascular/Lymphatic: Right internal iliac lymphadenopathy measures 3.1 x 2.4 cm on image 46/14, stable compared to prior CT and earlier MRI. No other pathologically enlarged lymph nodes identified. Reproductive:  No mass or other significant abnormality. Other: None. Musculoskeletal: No suspicious bone lesions identified. IMPRESSION: 5 cm masslike lesion in the region of the Hartmann's pouch, increased in size from earlier MRI several months ago, and highly suspicious for recurrent rectal carcinoma. Stable right internal iliac lymphadenopathy, consistent with metastatic disease. Stable diffuse bladder wall thickening, which may be due to previous radiation therapy or other etiology of cystitis. Electronically Signed   By: Danae Orleans M.D.   On: 04/07/2023 16:35   CT ABDOMEN PELVIS W  CONTRAST  Result Date: 03/02/2023 CLINICAL DATA:  57 year old male with history of acute onset of nonlocalized abdominal pain. History of rectal cancer. * Tracking Code: BO * EXAM: CT ABDOMEN AND PELVIS WITH CONTRAST TECHNIQUE: Multidetector CT imaging of the abdomen and pelvis was performed using the standard protocol following bolus administration of intravenous contrast. RADIATION DOSE REDUCTION: This exam was performed according to the departmental dose-optimization program which includes automated exposure control, adjustment of the mA and/or kV according to patient size and/or use of iterative reconstruction technique. CONTRAST:  75mL OMNIPAQUE IOHEXOL 350 MG/ML SOLN COMPARISON:  CT of the abdomen and pelvis 02/11/2023. FINDINGS: Lower chest: Atherosclerotic calcifications in the right coronary artery. Hepatobiliary: 1.3 cm partially exophytic low-attenuation lesion associated with segment 6 of the liver (axial image 29 of series 3), similar to the prior study, compatible with a small cyst. No other suspicious appearing hepatic lesions are noted. No intra or extrahepatic biliary ductal dilatation. Gallbladder is unremarkable in appearance. Pancreas: No pancreatic mass. No pancreatic ductal dilatation. No pancreatic or peripancreatic fluid collections or inflammatory changes. Spleen: Unremarkable. Adrenals/Urinary Tract: Subcentimeter low-attenuation lesions in both kidneys, too small to definitively characterize, but statistically likely to represent tiny cysts (no imaging follow-up recommended). No other suspicious appearing renal lesions are noted. No hydroureteronephrosis. Bilateral adrenal glands are normal in appearance. Urinary bladder wall appears severely thickened, most evident posteriorly. Mild haziness in the perivesical fat. Stomach/Bowel: The appearance of the stomach is unremarkable. There continues to be extensive mural thickening and increased mucosal and mural enhancement throughout portions  of the distal small bowel. Proximal loops of small bowel are mildly dilated measuring up to 3.1 cm in diameter. The degree of small-bowel dilatation appears slightly increased compared to the prior study. Some gas and stool is noted in the colon. 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 There is a poorly defined soft tissue mass (previously hypermetabolic on prior PET-CT 12/17/2022) in the low anatomic pelvis which appears intimately associated with the seminal vesicles, and makes contact with the posterior aspect of the urinary bladder wall, similar to the prior study best appreciated on axial image 77 of series 3 and sagittal image 99 of series 7, currently estimated  to measure approximately 5.3 x 4.9 x 7.6 cm. Vascular/Lymphatic: Atherosclerosis in the abdominal aorta and pelvic vasculature, without evidence of aneurysm or dissection. Heterogeneously enhancing nodal mass along the right pelvic sidewall measuring 2.8 x 3.2 cm (axial image 74 of series 3), similar to the recent prior study (and not previously hypermetabolic on prior PET-CT). No other definite lymphadenopathy confidently identified elsewhere in the abdomen or pelvis. Reproductive: Prostate gland and seminal vesicles are distorted by postoperative and postradiation changes in the pelvis, but the previously described pelvic mass is intimately associated with the seminal vesicles. Other: No significant volume of ascites.  No pneumoperitoneum. Musculoskeletal: There are no aggressive appearing lytic or blastic lesions noted in the visualized portions of the skeleton. IMPRESSION: 1. Findings are again suggestive of probable partial small bowel obstruction which appears likely related to enteritis. Specifically, there are multiple areas of mural thickening throughout the distal small bowel with increased mucosal and mural enhancement, with some very mild proximal small bowel dilatation. 2. Status post APR with left lower  quadrant colostomy and large soft tissue mass in the low anatomic pelvis which is slightly more prominent than the recent prior study, and was previously hypermetabolic on prior PET-CT 12/17/2022, suggesting residual disease. 3. Probable nodal mass along the right pelvic sidewall in the right internal iliac nodal distribution, similar to the prior study, previously not hypermetabolic on prior PET-CT. No other lymphadenopathy or definitive signs of metastatic disease noted elsewhere on today's examination. 4. Aortic atherosclerosis, as well as calcified atherosclerotic plaque in the right coronary artery. Please note that although the presence of coronary artery calcium documents the presence of coronary artery disease, the severity of this disease and any potential stenosis cannot be assessed on this non-gated CT examination. Assessment for potential risk factor modification, dietary therapy or pharmacologic therapy may be warranted, if clinically indicated. 5. Additional incidental findings, as above. Electronically Signed   By: Trudie Reed M.D.   On: 03/02/2023 07:01   DG Abdomen 1 View  Result Date: 02/11/2023 CLINICAL DATA:  Nasogastric tube placement. EXAM: ABDOMEN - 1 VIEW COMPARISON:  None Available. FINDINGS: The bowel gas pattern is normal. Distal tip of nasogastric tube is seen in expected position of proximal stomach. Residual contrast is noted in mildly dilated right intrarenal collecting system and proximal ureter and urinary bladder. IMPRESSION: Distal tip of nasogastric tube is seen in expected position of proximal stomach. Electronically Signed   By: Lupita Raider M.D.   On: 02/11/2023 10:18   CT ABDOMEN PELVIS W CONTRAST  Result Date: 02/11/2023 CLINICAL DATA:  History of rectal cancer. Evaluate for bowel obstruction. * Tracking Code: BO * EXAM: CT ABDOMEN AND PELVIS WITH CONTRAST TECHNIQUE: Multidetector CT imaging of the abdomen and pelvis was performed using the standard protocol  following bolus administration of intravenous contrast. RADIATION DOSE REDUCTION: This exam was performed according to the departmental dose-optimization program which includes automated exposure control, adjustment of the mA and/or kV according to patient size and/or use of iterative reconstruction technique. CONTRAST:  OMNIPAQUE IOHEXOL 300 MG/ML  SOLN COMPARISON:  PET-CT 12/17/2022 FINDINGS: Lower chest: No acute abnormality. Hepatobiliary: Unchanged cyst along the inferior margin of the right lobe of liver measuring 1.2 cm. No suspicious liver lesions identified at this time. Gallbladder appears normal without bile duct dilatation. Pancreas: Unremarkable. No pancreatic ductal dilatation or surrounding inflammatory changes. Spleen: Normal in size without focal abnormality. Adrenals/Urinary Tract: Normal adrenal glands. No nephrolithiasis. Several too small to characterize kidney lesions are  identified. No follow-up imaging recommended. There is new right-sided hydronephrosis and hydroureter up to the level of the bifurcation of right common iliac artery. The distal right ureter appears closely associated with the presacral soft tissue mass, image 62/2. Normal appearance of the left scratch set no left-sided hydronephrosis. There is diffuse circumferential wall thickening involving the urinary bladder. Stomach/Bowel: Stomach appears normal. Colostomy is identified within the left lower abdominal wall, just left of the midline. The appendix is visualized and appears normal. Within the left hemiabdomen there are several loops of small bowel which exhibit wall thickening, which measures up to 8 mm in thickness, image 60/2. Within the left hemiabdomen and extending into the pelvis there is a dilated loop of mid small bowel with fecalized is a shin. This measures up to 3.2 cm. Transition to decreased caliber distal small bowel noted within the left iliac fossa. The small bowel loops distal to the transition point  exhibit mucosal enhancement and wall thickening which measures up to 8 mm in thickness. Normal caliber of the colon up to the level of the ostomy. Vascular/Lymphatic: Aortic atherosclerosis without aneurysm. No pelvic or inguinal adenopathy. Reproductive: No mass. 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 This was tracer avid on the PET-CT from 12/17/2022 measuring 5.7 x 3.9 cm, similar to the previous exam. Within the right ovoid hypoattenuating lesion in the right posterior pelvis (not FDG avid on recent PET-CT) measures 3.3 x 2.6 cm, image 70/2. Previously this measured the same. No significant free fluid or fluid collections. No signs of pneumoperitoneum. Musculoskeletal: No acute or significant osseous findings. IMPRESSION: 1. Examination is positive for small bowel obstruction. Transition point is identified within the left iliac fossa. Distal to the transition point the small bowel loops exhibit mucosal enhancement and wall thickening concerning for enteritis. 2. There is new right-sided hydronephrosis and hydroureter up to the level of the bifurcation of right common iliac artery. The distal right ureter appears closely associated with the previously characterized tracer avid presacral soft tissue mass. Cannot exclude obstructive uropathy secondary to tumor involvement. 3. Similar appearance of presacral soft tissue mass. This was tracer avid on the recent PET-CT from 12/17/2022, and concerning for locally recurrent tumor. 4. Unchanged diffuse circumferential wall thickening involving the urinary bladder. 5. Aortic Atherosclerosis (ICD10-I70.0). Electronically Signed   By: Signa Kell M.D.   On: 02/11/2023 07:47

## 2023-04-17 NOTE — Progress Notes (Signed)
Pt here for follow up. Reports pain 10/10 to rectum, stated on Monday of this week.

## 2023-04-17 NOTE — Assessment & Plan Note (Addendum)
locally advanced rectal cancer-Stage IIIB, s/p TNT neoadjuvant protocol, concurrent Xeloda and radiation-Finished 08/27/2021, s/p  APR,rectal adenocarcinoma, ypT3N0. Positive radial margin due to perforation. Biopsy proven recurrent rectal cancer, locally advanced disease, possible colovesical fistula; no distant metastatic disease on PET- per [name] Surgeon Dr. Luciano Cutter, recurrence is not resectable.  S/p concurrent Xeloda 825 mg/m2 twice daily  with radiation, last radiation 6/14 MRI pelvis w wo contrast showed persistent disease. Disease is not resectable per colorectal surgeon  Recommend FOLFIRI + Panitumumab Rationale and side effects were reviewed with patient.

## 2023-04-17 NOTE — Assessment & Plan Note (Signed)
Chemotherapy plan as listed above 

## 2023-04-17 NOTE — Assessment & Plan Note (Signed)
Recommend oxycodone 5-10 mg Q6 hours PRN.  Recommend Fentanyl patch, he declined.

## 2023-04-17 NOTE — Assessment & Plan Note (Signed)
Continue potassium daily.

## 2023-04-20 ENCOUNTER — Inpatient Hospital Stay: Payer: 59

## 2023-04-20 ENCOUNTER — Telehealth: Payer: Self-pay

## 2023-04-20 VITALS — BP 124/86 | HR 92 | Temp 97.8°F | Resp 18

## 2023-04-20 DIAGNOSIS — C20 Malignant neoplasm of rectum: Secondary | ICD-10-CM | POA: Diagnosis not present

## 2023-04-20 MED ORDER — SODIUM CHLORIDE 0.9% FLUSH
10.0000 mL | INTRAVENOUS | Status: DC | PRN
  Filled 2023-04-20: qty 10

## 2023-04-20 MED ORDER — HEPARIN SOD (PORK) LOCK FLUSH 100 UNIT/ML IV SOLN
500.0000 [IU] | Freq: Once | INTRAVENOUS | Status: DC | PRN
  Filled 2023-04-20: qty 5

## 2023-04-20 NOTE — Telephone Encounter (Signed)
error 

## 2023-04-24 ENCOUNTER — Inpatient Hospital Stay: Payer: 59 | Admitting: Oncology

## 2023-04-24 ENCOUNTER — Inpatient Hospital Stay: Payer: 59

## 2023-04-24 NOTE — Assessment & Plan Note (Deleted)
 locally advanced rectal cancer-Stage IIIB, s/p TNT neoadjuvant protocol, concurrent Xeloda and radiation-Finished 08/27/2021, s/p  APR,rectal adenocarcinoma, ypT3N0. Positive radial margin due to perforation. Biopsy proven recurrent rectal cancer, locally advanced disease, possible colovesical fistula; no distant metastatic disease on PET- per [name] Surgeon Dr. Luciano Cutter, recurrence is not resectable.  S/p concurrent Xeloda 825 mg/m2 twice daily  with radiation, last radiation 6/14 MRI pelvis w wo contrast showed persistent disease. Disease is not resectable per colorectal surgeon  Recommend FOLFIRI + Panitumumab Rationale and side effects were reviewed with patient.

## 2023-04-28 ENCOUNTER — Other Ambulatory Visit: Payer: Self-pay

## 2023-04-28 ENCOUNTER — Encounter: Payer: Self-pay | Admitting: Oncology

## 2023-04-28 ENCOUNTER — Inpatient Hospital Stay: Payer: 59

## 2023-04-28 ENCOUNTER — Inpatient Hospital Stay (HOSPITAL_BASED_OUTPATIENT_CLINIC_OR_DEPARTMENT_OTHER): Payer: 59 | Admitting: Oncology

## 2023-04-28 VITALS — BP 143/94 | HR 89 | Temp 96.7°F | Resp 18 | Wt 163.5 lb

## 2023-04-28 DIAGNOSIS — E876 Hypokalemia: Secondary | ICD-10-CM

## 2023-04-28 DIAGNOSIS — K6289 Other specified diseases of anus and rectum: Secondary | ICD-10-CM | POA: Diagnosis not present

## 2023-04-28 DIAGNOSIS — Z5111 Encounter for antineoplastic chemotherapy: Secondary | ICD-10-CM

## 2023-04-28 DIAGNOSIS — Z9221 Personal history of antineoplastic chemotherapy: Secondary | ICD-10-CM | POA: Diagnosis not present

## 2023-04-28 DIAGNOSIS — R911 Solitary pulmonary nodule: Secondary | ICD-10-CM

## 2023-04-28 DIAGNOSIS — C20 Malignant neoplasm of rectum: Secondary | ICD-10-CM

## 2023-04-28 DIAGNOSIS — Z95828 Presence of other vascular implants and grafts: Secondary | ICD-10-CM

## 2023-04-28 DIAGNOSIS — Z79899 Other long term (current) drug therapy: Secondary | ICD-10-CM | POA: Diagnosis not present

## 2023-04-28 DIAGNOSIS — Z923 Personal history of irradiation: Secondary | ICD-10-CM | POA: Diagnosis not present

## 2023-04-28 DIAGNOSIS — Z5112 Encounter for antineoplastic immunotherapy: Secondary | ICD-10-CM | POA: Diagnosis not present

## 2023-04-28 DIAGNOSIS — Z87891 Personal history of nicotine dependence: Secondary | ICD-10-CM | POA: Diagnosis not present

## 2023-04-28 DIAGNOSIS — Z933 Colostomy status: Secondary | ICD-10-CM | POA: Diagnosis not present

## 2023-04-28 LAB — CMP (CANCER CENTER ONLY)
ALT: 20 U/L (ref 0–44)
AST: 20 U/L (ref 15–41)
Albumin: 3.7 g/dL (ref 3.5–5.0)
Alkaline Phosphatase: 80 U/L (ref 38–126)
Anion gap: 6 (ref 5–15)
BUN: 8 mg/dL (ref 6–20)
CO2: 24 mmol/L (ref 22–32)
Calcium: 8.9 mg/dL (ref 8.9–10.3)
Chloride: 104 mmol/L (ref 98–111)
Creatinine: 0.94 mg/dL (ref 0.61–1.24)
GFR, Estimated: >60 mL/min (ref >60)
Glucose, Bld: 121 mg/dL — ABNORMAL HIGH (ref 70–99)
Potassium: 3.5 mmol/L (ref 3.5–5.1)
Sodium: 134 mmol/L — ABNORMAL LOW (ref 135–145)
Total Bilirubin: 0.2 mg/dL — ABNORMAL LOW (ref 0.3–1.2)
Total Protein: 7.6 g/dL (ref 6.5–8.1)

## 2023-04-28 LAB — CBC WITH DIFFERENTIAL (CANCER CENTER ONLY)
Abs Immature Granulocytes: 0.01 10*3/uL (ref 0.00–0.07)
Basophils Absolute: 0 10*3/uL (ref 0.0–0.1)
Basophils Relative: 0 %
Eosinophils Absolute: 0.1 10*3/uL (ref 0.0–0.5)
Eosinophils Relative: 3 %
HCT: 42.2 % (ref 39.0–52.0)
Hemoglobin: 14.6 g/dL (ref 13.0–17.0)
Immature Granulocytes: 0 %
Lymphocytes Relative: 33 %
Lymphs Abs: 0.9 10*3/uL (ref 0.7–4.0)
MCH: 32.4 pg (ref 26.0–34.0)
MCHC: 34.6 g/dL (ref 30.0–36.0)
MCV: 93.6 fL (ref 80.0–100.0)
Monocytes Absolute: 0.6 10*3/uL (ref 0.1–1.0)
Monocytes Relative: 20 %
Neutro Abs: 1.2 10*3/uL — ABNORMAL LOW (ref 1.7–7.7)
Neutrophils Relative %: 44 %
Platelet Count: 211 10*3/uL (ref 150–400)
RBC: 4.51 MIL/uL (ref 4.22–5.81)
RDW: 13.6 % (ref 11.5–15.5)
WBC Count: 2.8 10*3/uL — ABNORMAL LOW (ref 4.0–10.5)
nRBC: 0 % (ref 0.0–0.2)

## 2023-04-28 MED ORDER — HEPARIN SOD (PORK) LOCK FLUSH 100 UNIT/ML IV SOLN
500.0000 [IU] | Freq: Once | INTRAVENOUS | Status: AC
  Administered 2023-04-28: 500 [IU] via INTRAVENOUS
  Filled 2023-04-28: qty 5

## 2023-04-28 MED ORDER — LIDOCAINE-PRILOCAINE 2.5-2.5 % EX CREA
1.0000 | TOPICAL_CREAM | CUTANEOUS | 2 refills | Status: DC | PRN
  Filled 2023-04-28: qty 30, 15d supply, fill #0

## 2023-04-28 MED ORDER — SODIUM CHLORIDE 0.9% FLUSH
10.0000 mL | Freq: Once | INTRAVENOUS | Status: AC
  Administered 2023-04-28: 10 mL via INTRAVENOUS
  Filled 2023-04-28: qty 10

## 2023-04-28 NOTE — Assessment & Plan Note (Addendum)
Chemotherapy next week.

## 2023-04-28 NOTE — Assessment & Plan Note (Signed)
 Continue potassium daily.

## 2023-04-28 NOTE — Assessment & Plan Note (Addendum)
locally advanced rectal cancer-Stage IIIB, s/p TNT neoadjuvant protocol, concurrent Xeloda and radiation-Finished 08/27/2021, s/p  APR,rectal adenocarcinoma, ypT3N0. Positive radial margin due to perforation. Biopsy proven recurrent rectal cancer, locally advanced disease, possible colovesical fistula; no distant metastatic disease on PET- per [name]geon Dr. Luciano Cutter, recurrence is not resectable.  S/p concurrent Xeloda 825 mg/m2 twice daily  with radiation, last radiation 6/14 MRI pelvis w wo contrast showed persistent disease. Disease is not resectable per colorectal surgeon  Labs are reviewed and discussed with patient. Tolerated cycle 1 FOLFIRI + Panitumumab

## 2023-04-28 NOTE — Progress Notes (Signed)
Hematology/Oncology Progress note [phone] [phone]      CHIEF COMPLAINTS/REASON FOR VISIT:  Follow up for recurrent rectal cancer treatment  ASSESSMENT & PLAN:   Cancer Staging  Rectal cancer [facility]) Staging form: Colon and Rectum, AJCC 8th Edition - Clinical stage from 02/08/2021: Stage IIIB (cT4a, cN1, cM0) - Signed by [name], MD on 03/06/2021   Rectal cancer [facility]) locally advanced rectal cancer-Stage IIIB, s/p TNT neoadjuvant protocol, concurrent Xeloda and radiation-Finished 08/27/2021, s/p  APR,rectal adenocarcinoma, ypT3N0. Positive radial margin due to perforation. Biopsy proven recurrent rectal cancer, locally advanced disease, possible colovesical fistula; no distant metastatic disease on PET- per [name] Surgeon Dr. Luciano Cutter, recurrence is not resectable.  S/p concurrent Xeloda 825 mg/m2 twice daily  with radiation, last radiation 6/14 MRI pelvis w wo contrast showed persistent disease. Disease is not resectable per colorectal surgeon  Labs are reviewed and discussed with patient. Tolerated cycle 1 FOLFIRI + Panitumumab    Encounter for antineoplastic chemotherapy Chemotherapy next week.   Lung nodule Previously discussed with IR, CT-guided biopsy is difficult due to the position of small size. Negative on PET evaluation.  Attention on follow-up CT scans.   Hypokalemia Continue potassium daily.   Former smoker Continue Nicotin patch to 7mg /24 hours  Rectal pain Recommend oxycodone 5-10 mg Q6 hours PRN.  he declined.Fentanyl patch,      Orders Placed This Encounter  Procedures   CBC with Differential (Cancer Center Only)    Standing Status:   Future    Standing Expiration Date:   05/17/2024   CMP (Cancer Center only)    Standing Status:   Future    Standing Expiration Date:   05/17/2024   Magnesium    Standing Status:   Future    Standing Expiration Date:   05/17/2024     Follow-up 1 week All questions were answered. The  patient knows to call the clinic with any problems, questions or concerns.  [name], MD, PhD [facility] Hematology Oncology 04/28/2023        HISTORY OF PRESENTING ILLNESS:   Barry [name]. is a  57 y.o.  male presents for rectal cancer Oncology History  Rectal cancer (HCC)  02/08/2021 Initial Diagnosis   Rectal cancer   02/05/2021-02/06/2021 patient was hospitalized due to generalized weakness, intermittent lightheadedness, weight loss and worsening constipation.  02/05/2021 CT abdomen showed concerning of severe rectal wall thickening and right internal iliac lymph node concerning for metastatic disease.  Patient was seen by gastroenterology and had colonoscopy which showed a circumferential fungating mass in the rectum.  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   02/14/2021-02/16/2021 hospitalized due to rectal bleeding and pain.   02/14/2021 CT showed perirectal fluid collection/gas concerning for infection with significant leukocytosis, anemia with hemoglobin of 6.8.  Patient received PRBC transfusion, IV antibiotics.  He underwent an IR guided placement of JP drain into the rectal abscess.  Discharged home with oral Augmentin. 02/20/2021 presented to ER with new skin opening and draining from left gluteus.  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  JP drain has been removed.  Patient was continued on Augmentin.   02/13/2021, PET scan showed locally advanced rectal cancer with billowing of the mesorectum now with low attenuation material that was not present on previous examination with extensive stranding and inflammation.   Bulky RIGHT pelvic sidewall/hypogastric lymph node outside of the mesorectum with stippled calcification measuring 19 mm-no increased metabolic activity. Small LEFT hypogastric lymph node just peripheral  to the internal external bifurcation-SUV 3.2 High RIGHT internal just below or at the internal/common iliac transition, lymph node -SUV  4.8 LEFT high hypogastric lymph node  8 mm-SUV 3. Scattered lymph nodes throughout the retroperitoneum with low FDG uptake Bilateral inguinal lymph nodes largest on the RIGHT (image 248/3) 11 mm with a maximum SUV of 2.8 spiculated nodule in the LEFT upper lobe- 9 x 8 mm       02/14/2021, CT abdomen pelvis without contrast Showed perforated rectal mass with contained perforation with fluid and gas extending above and below the pelvic floor,potentially involving the sphincter complex and extending into LEFT ischial rectal fossa   02/20/2021, CT pelvis with contrast showed Large perirectal/perianal abscess has markedly decreased in size since placement of the percutaneous drain. There are residual gas-filled collections in the soft tissues and suspect there is a fistula or sinus tract between the rectal mass and the subcutaneous tissues. Soft tissue gas along the medial left buttock and concern for a cutaneous ulceration in this area. Large rectal mass with evidence for a large necrotic right pelvic lymph node.   His case is complicated with a perirectal abscess. Prior to onset of perirectal abscess, on his 02/05/2021 scan, he was noted to have right internal iliac lymph node 3 x 1 x 2.3 cm which is concerning for nodal disease.On Subsequent images it was difficult to distinguish whether lymphadenopathy was due to nodal disease versus acute inflammation. 03/07/2021 MRI pelvis cT3 N2. Due to the possible contained perforation on previous CT, possible cT4 disease.    02/27/2021 medi port placed by Dr.Dew 03/13/2021 reports right butt cheek and also left perianal area fullness.he was seen by Dr.Pabon urgently  and had right buttock abscess drained. Left perianal fullness was felt to be due to cancer.    02/08/2021 Cancer Staging   Staging form: Colon and Rectum, AJCC 8th Edition - Clinical stage from 02/08/2021: Stage IIIB (cT4a, cN1, cM0) - Signed by [name], MD on 03/06/2021 Stage prefix: Initial diagnosis    02/27/2021 Procedure   medi port placed by Dr.Dew   03/18/2021 - 07/05/2021 Chemotherapy    FOLFOX q14d x 4 months      07/17/2021 - 08/27/2021 Chemotherapy   Xeloda concurrent with Radiation   11/18/2021 Surgery   patient is status post open APR with flap for rectal cancer. Pathology rectal adenomacarcinoma ypT3N0.  Positive margin: Radial (circumferential) or mesenteric: adjacent to perforation      01/27/2022 - 02/02/2022 Hospital Admission   Hospitalized at [facility] due to recurrent abscess.  US guided drain into the pelvic abscess with return of 60 ml of purulent fluid. Cultures positive for staph aureus and strep agalactiae group b, fungal cultures negative.    02/25/2022 Imaging   CT chest angiogram with and without contrast, CT abdomen pelvis with and without contrast 1. Negative for acute pulmonary embolus.2. Emphysema. Further decrease in size of the previously noted irregular nodule in the left upper lobe which is now barely measurable today. No new suspicious lung nodules 3. Status post left lower quadrant colostomy. Further decrease in size since comparison exam from May of the rim enhancing gas and fluid collection at the pelvic surgical bed extending from the perineum superiorly into the pelvis 4. Slightly thickened appearance of terminal ileal small bowel loops in the pelvis with mild stranding suggesting small bowel inflammatory process. 5. Stable enlarged right pelvic sidewall lymph node   06/02/2022 Imaging   CT chest abdomen pelvis w contrast 1. Status post  abdominal perineal resection with descending colostomy. 2. At the site of pelvic fluid and gas collection on 02/25/2022, there is residual, decreased presacral soft tissue fullness, without drainable collection. 3. Similar right obturator nodal metastasis.4.  No acute process or evidence of metastatic disease in the chest. 5. Age advanced coronary artery atherosclerosis. Recommend assessment of coronary risk factors. 6.  Aortic atherosclerosis and emphysema     06/26/2022 Imaging   CT abdomen pelvis with contrast showed 1. Interval increase in size of a lobulated partially imaged at least 7.8 x 3.8 x 12 cm abscess in the perianal region in a patient status post abdominal perineal resection and left lower lobe end colostomy formation. Finding extends to involve the pre sacral region up to the urinary dome level. Associated posterior urinary bladder wall thickening with lack of intraperitoneal fat plane between the presacral soft tissue thickening/abscess formation suggestive of possible fistulization and invasion of the posterior bladder wall. Underlying recurrent malignancy is not excluded. 2. Stable 2.7 cm right pelvic sidewall lymph node. 3.  Aortic Atherosclerosis   06/26/2022 - 06/27/2022 Hospital Admission   He went to [facility] ER and CT showed recurrent abscess. He was transferred to [facility] due to recurrent abscess, treated with IV vanc/zosyn, IR was consulted and drainage tube was placed. Wound grew up Strep viridans. Patient was seen by ID at [facility] discharged with Augmentin for 2 weeks.   He was seen by wound care Dr> Kandace Blitz on 07/09/2022, JP drain was removed.    09/29/2022 Imaging   MRI pelvis without contrast showed  Interval abdominoperitoneal resection since prior MRI. Decreased small fluid collection in the surgical bed compared with more recent CT, consistent with resolving postoperative fluid collection or abscess.   Bulky rounded presacral mass, which shows diffuse restricted diffusion, without significant change in size since most recent CT of 06/26/2022. This raises suspicion for recurrent carcinoma over post treatment changes. Recommend correlation with CEA level, and consider tissue sampling or PET-CT.   Stable enlarged right pelvic sidewall lymph node. No new or increased adenopathy identified.   10/09/2022 Imaging   CT chest with contrast showed 1. Unchanged 0.4 cm fissural nodule of the  anterior left lower lobe. This is almost certainly a benign fissural lymph node. No new or suspicious pulmonary nodules. 2. Moderate emphysema and diffuse bilateral bronchial wall thickening. 3. Coronary artery disease.   10/13/2022 Imaging   CT abdomen pelvis with contrast showed 1. Complex collection persists in the lower pelvis, extending from the anal verge upwards into the presacral space and anteriorly from the presacral space to the bladder dome, not significantly changed in size or extent compared to the earlier CT of 06/26/2022. This is most likely a combination of a postoperative seroma and/or chronic phlegmon/abscess versus recurrent abscess. 2. Bladder walls are thick walled/edematous. Given the contiguity of the presacral collection and the bladder dome, this is highly suspicious for a related bladder wall infection and possibly secondary to colovesical fistula. Recommend correlation with urinalysis. 3. No evidence of bowel obstruction. LEFT lower abdominal wall colostomy, without obstruction or inflammatory change. 4. No free intraperitoneal air   10/13/2022 [facility] Admission   Hospitalized due to fever and rectal pain. Urine culture showed mixed urogenital flora. IR CT guided Aspiration of abscess showed Streptococcal Viridans. He was placed back on Augmentin  10/14/2023 CT read by [facility] radiology Postsurgical changes of abdominoperineal resection. Interval enlargement of  fluid collection in the surgical bed. Multiple foci of enhancing tissue at  the margins  of this fluid collection, increased from prior study, suspicious for local recurrence.   Prominent retroperitoneal lymph nodes, some which are increased in size from prior study. These are indeterminate and may be either reactive or represent metastatic disease. Recommend continued attention on follow-up.   Centrally hypoattenuating right obturator lymph node, unchanged in size, consistent with treated disease.   Circumferential  bladder wall thickening and irregularity, most pronounced on the left side. This may be reactive in the setting of adjacent pelvic inflammatory changes. Correlate with urinalysis if there is clinical  concern for urinary tract infection.    11/14/2022 Imaging   MRI pelvis w wo contrast  1. Status post abdominoperineal resection with left lower quadrant end colostomy. 2. Compared to prior CT and MR, no significant change in appearance of heterogeneous, rim enhancing presacral and low pelvic soft tissue and fluid. Largest heterogeneously enhancing conglomerate of fluid and soft tissue appears to closely involve the anteriorly abutting the seminal vesicles and measures 3.0 x 2.9 cm in largest axial dimension. This extends to superiorly contact the posterior bladder dome and inferiorly towards the gluteal cleft. 3. Discrete fluid component at the most inferior extent measuring 2.4 x 1.3 cm. This is markedly diminished in volume compared to more remote previous examinations, for example 06/26/2022. 4. Constellation of findings is highly concerning for locally recurrent rectal malignancy with or without superimposed infection. Presence or absence of infection at this time is not established by MR. 5. Additional unchanged hemorrhagic or proteinaceous fluid collection in the right hemipelvis measuring 3.2 x 2.6 cm most consistent with postoperative hematoma or seroma. 6. Unchanged thickening of the urinary bladder wall. As previously reported, fistula or involvement of bladder wall by malignancy not excluded.   12/09/2022 Procedure   CT guided biopsy of the pelvic mass showed Moderately differentiated adenocarcinoma with dirty necrosis, morphologically identical to patient's prior rectal adenocarcinoma.   [facility] NGS xT 648 panel showed MLH3 start loss,  BCORL1 frame shift, TP53 splice region varian, APC stop gain,  TMB 7.9, MS stable, KRAS/BRAF/NRAS negative.   [facility] NGS xR showed no gene arrangement or  reportable altered splicing events in RNA sequencing      01/07/2023 -  Chemotherapy   Xeloda 825 mg/m2 twice daily  with radiation   02/11/2023 Imaging   CT abdomen pelvis w contrast  1. Examination is positive for small bowel obstruction. Transition point is identified within the left iliac fossa. Distal to the transition point the small bowel loops exhibit mucosal enhancement and wall thickening concerning for enteritis. 2. There is new right-sided hydronephrosis and hydroureter up to the level of the bifurcation of right common iliac artery. The distal right ureter appears closely associated with the previously characterized tracer avid presacral soft tissue mass. Cannot exclude obstructive uropathy secondary to tumor involvement. 3. Similar appearance of presacral soft tissue mass. This was tracer avid on the recent PET-CT from 12/17/2022, and concerning for locally recurrent tumor. 4. Unchanged diffuse circumferential wall thickening involving the urinary bladder. 5. Aortic Atherosclerosis    04/17/2023 -  Chemotherapy   Patient is on Treatment Plan : COLORECTAL FOLFIRI + Panitumumab q14d       Patient presented to emergency room on 12/08/21, rectal pain.  CT scan showed 18.3 cm fluid and gas collection in the pelvic surgical bed compatible with infected collection/abscess.  Patient was sent to [facility] and admitted for.  Patient has CT-guided drain placement on 12/09/2021.  Blood culture positive for Staph epidermidis which was felt to be  contaminant.  Fluid culture grew pansensitive Staph aureus, strep pyogenes and candida albicans.  Patient was treated with IV antibiotics and transition to p.o. Augmentin and fluconazole on 12/11/2021.  #Perirectal abscess status post drainage catheter , patient was recently seen by [facility] oncology surgeon on 12/24/2021 and was recommended to keep drainage catheter and continue antibiotics. Patient had a repeat CT scan done which showed a smaller but persistent abscess.   He will return to [facility] surgery for follow-up of drain removal. His case was discussed on [name] tumor board for the positive mesenteric margin which was felt to be secondary to previous perforation.  Recommend surveillance.  02/11/23 patient was admitted due to nausea, CT showed small bowel obstruction, severe constipation, NG tube was placed. His symptoms improved and has ostomy output, NG was removed and he tolerated PO.   During the interval ER visit due to abdominal pain.  03/02/23 CT abdomen pelvis w contrast showed . Findings are again suggestive of probable partial small bowel obstruction which appears likely related to enteritis. Specifically, there are multiple areas of mural thickening throughout the distal small bowel with increased mucosal and mural enhancement, with some very mild proximal small bowel dilatation. 2. Status post APR with left lower quadrant colostomy and large soft tissue mass in the low anatomic pelvis which is slightly more prominent than the recent prior study, and was previously hypermetabolic on prior PET-CT 12/17/2022, suggesting residual disease. 3. Probable nodal mass along the right pelvic sidewall in the right internal iliac nodal distribution, similar to the prior study, previously not hypermetabolic on prior PET-CT. No other lymphadenopathy or definitive signs of metastatic disease noted elsewhere on today's examination.  4. Aortic atherosclerosis, as well as calcified atherosclerotic plaque in the right coronary artery. Please note that although the presence of coronary artery calcium documents the presence of coronary artery disease, the severity of this disease and any potential stenosis cannot be assessed on this non-gated CT examination. Assessment for potential risk factor modification, dietary therapy or pharmacologic therapy may be warranted, if clinically indicated. 5. Additional incidental findings, as above. Patient has radiation induced enteritis. His  symptoms were mild he was discharged home.   INTERVAL HISTORY Barry [name]. is a 57 y.o. male who has above history reviewed by me today presents for follow up visit for management of recurrent locally advanced rectal cancer.  He feels very good today, Rectal pain is controlled, good appetite.  Denies fever chills, rectal pain. Denies rectal discharge.  Denies nausea vomiting or abdominal pain.      Review of Systems  Constitutional:  Negative for appetite change, chills, fatigue, fever and unexpected weight change.  HENT:   Negative for hearing loss and voice change.   Eyes:  Negative for eye problems and icterus.  Respiratory:  Negative for chest tightness, cough and shortness of breath.   Cardiovascular:  Negative for chest pain and leg swelling.  Gastrointestinal:  Negative for abdominal distention, abdominal pain, blood in stool, constipation, diarrhea and rectal pain.       Rectal pain  Endocrine: Negative for hot flashes.  Genitourinary:  Negative for difficulty urinating, dysuria and frequency.   Musculoskeletal:  Negative for arthralgias.  Skin:  Negative for itching and rash.  Neurological:  Negative for light-headedness and numbness.  Hematological:  Negative for adenopathy. Does not bruise/bleed easily.  Psychiatric/Behavioral:  Negative for confusion.     MEDICAL HISTORY:  Past Medical History:  Diagnosis Date   Headache    Left  shoulder pain    Rectal cancer (HCC)     SURGICAL HISTORY: Past Surgical History:  Procedure Laterality Date   COLON SURGERY     COLONOSCOPY WITH PROPOFOL N/A 02/06/2021   Procedure: COLONOSCOPY WITH PROPOFOL;  Surgeon: Toney Reil, MD;  Location: [facility] ENDOSCOPY;  Service: Gastroenterology;  Laterality: N/A;   PORTA CATH INSERTION N/A 02/27/2021   Procedure: PORTA CATH INSERTION;  Surgeon: Annice Needy, MD;  Location: [facility] INVASIVE CV LAB;  Service: Cardiovascular;  Laterality: N/A;   ROTATOR CUFF REPAIR Left      SOCIAL HISTORY: Social History   Socioeconomic History   Marital status: Married    Spouse name: Not on file   Number of children: Not on file   Years of education: Not on file   Highest education level: Not on file  Occupational History   Not on file  Tobacco Use   Smoking status: Former    Current packs/day: 0.00    Average packs/day: 1 pack/day for 10.0 years (10.0 ttl pk-yrs)    Types: Cigars, Cigarettes    Start date: 02/05/2011    Quit date: 02/04/2021    Years since quitting: 2.2   Smokeless tobacco: Never  Substance and Sexual Activity   Alcohol use: Not Currently   Drug use: No   Sexual activity: Not on file  Other Topics Concern   Not on file  Social History Narrative   Not on file   Social Determinants of Health   Financial Resource Strain: High Risk (02/13/2023)   Received from [facility], [facility]   Overall Financial Resource Strain (CARDIA)    Difficulty of Paying Living Expenses: Hard  Food Insecurity: No Food Insecurity (02/13/2023)   Received from [facility], [facility]   Hunger Vital Sign    Worried About Running Out of Food in the Last Year: Never true    Ran Out of Food in the Last Year: Never true  Transportation Needs: No Transportation Needs (02/13/2023)   Received from [facility], [facility]   [facility] - Transportation    In the past 12 months, has lack of transportation kept you from medical appointments or from getting medications?: No    Lack of Transportation (Non-Medical): No  Physical Activity: Inactive (11/03/2022)   Exercise Vital Sign    Days of Exercise per Week: 0 days    Minutes of Exercise per Session: 0 min  Stress: Stress Concern Present (11/03/2022)   [employer] of Occupational Health - Occupational Stress Questionnaire    Feeling of Stress : Rather much  Social Connections: Moderately Isolated (11/03/2022)    Social Connection and Isolation Panel [NHANES]    Frequency of Communication with Friends and Family: Twice a week    Frequency of Social Gatherings with Friends and Family: Three times a week    Attends Religious Services: 1 to 4 times per year    Active Member of Clubs or Organizations: No    Attends Banker Meetings: Never    Marital Status: Separated  Intimate Partner Violence: Not At Risk (11/03/2022)   Humiliation, Afraid, Rape, and Kick questionnaire    Fear of Current or Ex-Partner: No    Emotionally Abused: No    Physically Abused: No    Sexually Abused: No    FAMILY HISTORY: Family History  Problem Relation Age of Onset   Cancer Sister    Diabetes Mother  Cancer Maternal Grandmother    Cancer Paternal Grandmother     ALLERGIES:  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  MEDICATIONS:  Current Outpatient Medications  Medication Sig Dispense Refill   calcium-vitamin D (OSCAL WITH D) 500-5 MG-MCG tablet Take 2 tablets by mouth daily. 60 tablet 3   citalopram (CELEXA) 10 MG tablet Take 1 tablet (10 mg total) by mouth daily. 30 tablet 3   lidocaine-prilocaine (EMLA) cream Apply 1 Application topically as needed. Apply small amount to port and cover with saran wrap 1-2 hours prior to port access 30 g 2   loperamide (IMODIUM) 2 MG capsule Take 2 capsules (4mg ) by mouth initially. Then take 1 capsule by mouth every 2 hours (4mg  every 4 hours at night). Take no more than 16mg /day 90 capsule 2   ondansetron (ZOFRAN) 8 MG tablet Take 1 tablet (8 mg total) by mouth every 8 (eight) hours as needed for nausea or vomiting. 90 tablet 1   oxyCODONE (ROXICODONE) 5 MG immediate release tablet Take 1-2 tablets (5-10 mg total) by mouth every 6 (six) hours as needed for severe pain. 90 tablet 0   prochlorperazine (COMPAZINE) 10 MG tablet Take 1 tablet (10 mg total) by mouth every 6 (six) hours as needed for nausea or vomiting. 90 tablet 1   nicotine (NICODERM CQ - DOSED IN MG/24 HR) 7  mg/24hr patch Place 1 patch (7 mg total) onto the skin daily. (Patient not taking: Reported on 04/28/2023) 28 patch 2   potassium chloride SA (KLOR-CON M) 20 MEQ tablet Take 1 tablet (20 mEq total) by mouth daily. (Patient not taking: Reported on 04/28/2023) 7 tablet 0   senna (SENOKOT) 8.6 MG TABS tablet Take 2 tablets (17.2 mg total) by mouth daily. (Patient not taking: Reported on 04/28/2023) 100 tablet 0   traZODone (DESYREL) 50 MG tablet Take 1 tablet (50 mg total) by mouth at bedtime as needed for sleep. (Patient not taking: Reported on 04/28/2023) 30 tablet 3   No current facility-administered medications for this visit.   Facility-Administered Medications Ordered in Other Visits  Medication Dose Route Frequency Provider Last Rate Last Admin   heparin lock flush 100 unit/mL  500 Units Intravenous Once [name], MD         PHYSICAL EXAMINATION: ECOG PERFORMANCE STATUS: 1 - Symptomatic but completely ambulatory Vitals:   04/28/23 0907  BP: (!) 143/94  Pulse: 89  Resp: 18  Temp: (!) 96.7 F (35.9 C)  SpO2: 100%     Filed Weights   04/28/23 0907  Weight: 163 lb 8 oz (74.2 kg)      Physical Exam HENT:     Head: Normocephalic and atraumatic.  Eyes:     General: No scleral icterus. Cardiovascular:     Rate and Rhythm: Normal rate and regular rhythm.     Heart sounds: Normal heart sounds.  Pulmonary:     Effort: Pulmonary effort is normal. No respiratory distress.     Breath sounds: No wheezing.     Comments: Decreased breath sound bilaterally.  Abdominal:     General: Bowel sounds are normal. There is no distension.     Palpations: Abdomen is soft.     Comments: Colostomy bag  Genitourinary:    Comments: Status post APR, Musculoskeletal:        General: No deformity. Normal range of motion.     Cervical back: Normal range of motion and neck supple.  Skin:    General: Skin is warm and dry.  Findings: No erythema or rash.  Neurological:     Mental Status: He is  alert and oriented to person, place, and time. Mental status is at baseline.     Cranial Nerves: No cranial nerve deficit.     Coordination: Coordination normal.  Psychiatric:        Mood and Affect: Mood normal.     LABORATORY DATA:  I have reviewed the data as listed    Latest Ref Rng & Units 04/28/2023    8:53 AM 04/17/2023    8:15 AM 03/02/2023    4:45 AM  CBC  WBC 4.0 - 10.5 K/uL 2.8  4.4  7.6   Hemoglobin 13.0 - 17.0 g/dL 96.2  95.2  84.1   Hematocrit 39.0 - 52.0 % 42.2  44.0  41.2   Platelets 150 - 400 K/uL 211  231  211       Latest Ref Rng & Units 04/28/2023    8:53 AM 04/17/2023    8:15 AM 03/02/2023    4:45 AM  CMP  Glucose 70 - 99 mg/dL 324  401  027   BUN 6 - 20 mg/dL 8  8  10    Creatinine 0.61 - 1.24 mg/dL 2.53  6.64  4.03   Sodium 135 - 145 mmol/L 134  136  139   Potassium 3.5 - 5.1 mmol/L 3.5  3.2  3.5   Chloride 98 - 111 mmol/L 104  106  105   CO2 22 - 32 mmol/L 24  21  24    Calcium 8.9 - 10.3 mg/dL 8.9  8.3  9.0   Total Protein 6.5 - 8.1 g/dL 7.6  7.5  7.7   Total Bilirubin 0.3 - 1.2 mg/dL 0.2  0.4  0.7   Alkaline Phos 38 - 126 U/L 80  87  83   AST 15 - 41 U/L 20  19  21    ALT 0 - 44 U/L 20  15  15       RADIOGRAPHIC STUDIES: I have personally reviewed the radiological images as listed and agreed with the findings in the report. MR PELVIS W WO CONTRAST  Result Date: 04/07/2023 CLINICAL DATA:  Recurrent rectal carcinoma. Presacral mass. Prior APR EXAM: MRI PELVIS WITHOUT AND WITH CONTRAST TECHNIQUE: Multiplanar multisequence MR imaging of the pelvis was performed both before and after administration of intravenous contrast. CONTRAST:  7mL GADAVIST GADOBUTROL 1 MMOL/ML IV SOLN COMPARISON:  CT on 03/02/2023 and MRI on 11/14/2022 FINDINGS: Lower Urinary Tract: Diffuse bladder wall thickening is stable, and may be due to previous radiation therapy or cystitis. Bowel: Postop changes from previous APR. Poorly defined and rounded masslike lesion is seen in the region  of the Hartmann's pouch which shows peripheral nodular enhancement. This measures 5.0 x 3.8 cm on image 51/22, without significant change compared to most recent CT, but increased from earlier MRI when it measured approximately 3.0 x 2.9 cm. This is highly suspicious for recurrent rectal carcinoma. Vascular/Lymphatic: Right internal iliac lymphadenopathy measures 3.1 x 2.4 cm on image 46/14, stable compared to prior CT and earlier MRI. No other pathologically enlarged lymph nodes identified. Reproductive:  No mass or other significant abnormality. Other: None. Musculoskeletal: No suspicious bone lesions identified. IMPRESSION: 5 cm masslike lesion in the region of the Hartmann's pouch, increased in size from earlier MRI several months ago, and highly suspicious for recurrent rectal carcinoma. Stable right internal iliac lymphadenopathy, consistent with metastatic disease. Stable diffuse bladder wall thickening, which may be due to  previous radiation therapy or other etiology of cystitis. Electronically Signed   By: Danae Orleans M.D.   On: 04/07/2023 16:35   CT ABDOMEN PELVIS W CONTRAST  Result Date: 03/02/2023 CLINICAL DATA:  57 year old male with history of acute onset of nonlocalized abdominal pain. History of rectal cancer. * Tracking Code: BO * EXAM: CT ABDOMEN AND PELVIS WITH CONTRAST TECHNIQUE: Multidetector CT imaging of the abdomen and pelvis was performed using the standard protocol following bolus administration of intravenous contrast. RADIATION DOSE REDUCTION: This exam was performed according to the departmental dose-optimization program which includes automated exposure control, adjustment of the mA and/or kV according to patient size and/or use of iterative reconstruction technique. CONTRAST:  75mL OMNIPAQUE IOHEXOL 350 MG/ML SOLN COMPARISON:  CT of the abdomen and pelvis 02/11/2023. FINDINGS: Lower chest: Atherosclerotic calcifications in the right coronary artery. Hepatobiliary: 1.3 cm partially  exophytic low-attenuation lesion associated with segment 6 of the liver (axial image 29 of series 3), similar to the prior study, compatible with a small cyst. No other suspicious appearing hepatic lesions are noted. No intra or extrahepatic biliary ductal dilatation. Gallbladder is unremarkable in appearance. Pancreas: No pancreatic mass. No pancreatic ductal dilatation. No pancreatic or peripancreatic fluid collections or inflammatory changes. Spleen: Unremarkable. Adrenals/Urinary Tract: Subcentimeter low-attenuation lesions in both kidneys, too small to definitively characterize, but statistically likely to represent tiny cysts (no imaging follow-up recommended). No other suspicious appearing renal lesions are noted. No hydroureteronephrosis. Bilateral adrenal glands are normal in appearance. Urinary bladder wall appears severely thickened, most evident posteriorly. Mild haziness in the perivesical fat. Stomach/Bowel: The appearance of the stomach is unremarkable. There continues to be extensive mural thickening and increased mucosal and mural enhancement throughout portions of the distal small bowel. Proximal loops of small bowel are mildly dilated measuring up to 3.1 cm in diameter. The degree of small-bowel dilatation appears slightly increased compared to the prior study. Some gas and stool is noted in the colon. 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 There is a poorly defined soft tissue mass (previously hypermetabolic on prior PET-CT 12/17/2022) in the low anatomic pelvis which appears intimately associated with the seminal vesicles, and makes contact with the posterior aspect of the urinary bladder wall, similar to the prior study best appreciated on axial image 77 of series 3 and sagittal image 99 of series 7, currently estimated to measure approximately 5.3 x 4.9 x 7.6 cm. Vascular/Lymphatic: Atherosclerosis in the abdominal aorta and pelvic vasculature, without evidence of  aneurysm or dissection. Heterogeneously enhancing nodal mass along the right pelvic sidewall measuring 2.8 x 3.2 cm (axial image 74 of series 3), similar to the recent prior study (and not previously hypermetabolic on prior PET-CT). No other definite lymphadenopathy confidently identified elsewhere in the abdomen or pelvis. Reproductive: Prostate gland and seminal vesicles are distorted by postoperative and postradiation changes in the pelvis, but the previously described pelvic mass is intimately associated with the seminal vesicles. Other: No significant volume of ascites.  No pneumoperitoneum. Musculoskeletal: There are no aggressive appearing lytic or blastic lesions noted in the visualized portions of the skeleton. IMPRESSION: 1. Findings are again suggestive of probable partial small bowel obstruction which appears likely related to enteritis. Specifically, there are multiple areas of mural thickening throughout the distal small bowel with increased mucosal and mural enhancement, with some very mild proximal small bowel dilatation. 2. Status post APR with left lower quadrant colostomy and large soft tissue mass in the low anatomic pelvis which is  slightly more prominent than the recent prior study, and was previously hypermetabolic on prior PET-CT 12/17/2022, suggesting residual disease. 3. Probable nodal mass along the right pelvic sidewall in the right internal iliac nodal distribution, similar to the prior study, previously not hypermetabolic on prior PET-CT. No other lymphadenopathy or definitive signs of metastatic disease noted elsewhere on today's examination. 4. Aortic atherosclerosis, as well as calcified atherosclerotic plaque in the right coronary artery. Please note that although the presence of coronary artery calcium documents the presence of coronary artery disease, the severity of this disease and any potential stenosis cannot be assessed on this non-gated CT examination. Assessment for  potential risk factor modification, dietary therapy or pharmacologic therapy may be warranted, if clinically indicated. 5. Additional incidental findings, as above. Electronically Signed   By: Trudie Reed M.D.   On: 03/02/2023 07:01   DG Abdomen 1 View  Result Date: 02/11/2023 CLINICAL DATA:  Nasogastric tube placement. EXAM: ABDOMEN - 1 VIEW COMPARISON:  None Available. FINDINGS: The bowel gas pattern is normal. Distal tip of nasogastric tube is seen in expected position of proximal stomach. Residual contrast is noted in mildly dilated right intrarenal collecting system and proximal ureter and urinary bladder. IMPRESSION: Distal tip of nasogastric tube is seen in expected position of proximal stomach. Electronically Signed   By: Lupita Raider M.D.   On: 02/11/2023 10:18   CT ABDOMEN PELVIS W CONTRAST  Result Date: 02/11/2023 CLINICAL DATA:  History of rectal cancer. Evaluate for bowel obstruction. * Tracking Code: BO * EXAM: CT ABDOMEN AND PELVIS WITH CONTRAST TECHNIQUE: Multidetector CT imaging of the abdomen and pelvis was performed using the standard protocol following bolus administration of intravenous contrast. RADIATION DOSE REDUCTION: This exam was performed according to the departmental dose-optimization program which includes automated exposure control, adjustment of the mA and/or kV according to patient size and/or use of iterative reconstruction technique. CONTRAST:  OMNIPAQUE IOHEXOL 300 MG/ML  SOLN COMPARISON:  PET-CT 12/17/2022 FINDINGS: Lower chest: No acute abnormality. Hepatobiliary: Unchanged cyst along the inferior margin of the right lobe of liver measuring 1.2 cm. No suspicious liver lesions identified at this time. Gallbladder appears normal without bile duct dilatation. Pancreas: Unremarkable. No pancreatic ductal dilatation or surrounding inflammatory changes. Spleen: Normal in size without focal abnormality. Adrenals/Urinary Tract: Normal adrenal glands. No  nephrolithiasis. Several too small to characterize kidney lesions are identified. No follow-up imaging recommended. There is new right-sided hydronephrosis and hydroureter up to the level of the bifurcation of right common iliac artery. The distal right ureter appears closely associated with the presacral soft tissue mass, image 62/2. Normal appearance of the left scratch set no left-sided hydronephrosis. There is diffuse circumferential wall thickening involving the urinary bladder. Stomach/Bowel: Stomach appears normal. Colostomy is identified within the left lower abdominal wall, just left of the midline. The appendix is visualized and appears normal. Within the left hemiabdomen there are several loops of small bowel which exhibit wall thickening, which measures up to 8 mm in thickness, image 60/2. Within the left hemiabdomen and extending into the pelvis there is a dilated loop of mid small bowel with fecalized is a shin. This measures up to 3.2 cm. Transition to decreased caliber distal small bowel noted within the left iliac fossa. The small bowel loops distal to the transition point exhibit mucosal enhancement and wall thickening which measures up to 8 mm in thickness. Normal caliber of the colon up to the level of the ostomy. Vascular/Lymphatic: Aortic atherosclerosis without aneurysm.  No pelvic or inguinal adenopathy. Reproductive: No mass. 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 This was tracer avid on the PET-CT from 12/17/2022 measuring 5.7 x 3.9 cm, similar to the previous exam. Within the right ovoid hypoattenuating lesion in the right posterior pelvis (not FDG avid on recent PET-CT) measures 3.3 x 2.6 cm, image 70/2. Previously this measured the same. No significant free fluid or fluid collections. No signs of pneumoperitoneum. Musculoskeletal: No acute or significant osseous findings. IMPRESSION: 1. Examination is positive for small bowel obstruction. Transition point is identified within  the left iliac fossa. Distal to the transition point the small bowel loops exhibit mucosal enhancement and wall thickening concerning for enteritis. 2. There is new right-sided hydronephrosis and hydroureter up to the level of the bifurcation of right common iliac artery. The distal right ureter appears closely associated with the previously characterized tracer avid presacral soft tissue mass. Cannot exclude obstructive uropathy secondary to tumor involvement. 3. Similar appearance of presacral soft tissue mass. This was tracer avid on the recent PET-CT from 12/17/2022, and concerning for locally recurrent tumor. 4. Unchanged diffuse circumferential wall thickening involving the urinary bladder. 5. Aortic Atherosclerosis (ICD10-I70.0). Electronically Signed   By: Signa Kell M.D.   On: 02/11/2023 07:47

## 2023-04-28 NOTE — Assessment & Plan Note (Signed)
Previously discussed with IR, CT-guided biopsy is difficult due to the position of small size. Negative on PET evaluation.  Attention on follow-up CT scans.

## 2023-04-28 NOTE — Progress Notes (Signed)
Pt here for follow up. Pt reports feeling "good". He does report that he has some diarrhea and nausea but he is taking imodium and antiemetics, which are helping.

## 2023-04-28 NOTE — Assessment & Plan Note (Signed)
Continue Nicotin patch to 7mg/24 hours 

## 2023-04-28 NOTE — Assessment & Plan Note (Signed)
Recommend oxycodone 5-10 mg Q6 hours PRN.  he declined.Fentanyl patch,

## 2023-05-01 MED FILL — Dexamethasone Sodium Phosphate Inj 100 MG/10ML: INTRAMUSCULAR | Qty: 1 | Status: AC

## 2023-05-04 ENCOUNTER — Inpatient Hospital Stay (HOSPITAL_BASED_OUTPATIENT_CLINIC_OR_DEPARTMENT_OTHER): Payer: 59 | Admitting: Oncology

## 2023-05-04 ENCOUNTER — Other Ambulatory Visit: Payer: Self-pay

## 2023-05-04 ENCOUNTER — Inpatient Hospital Stay: Payer: 59

## 2023-05-04 ENCOUNTER — Encounter: Payer: Self-pay | Admitting: Oncology

## 2023-05-04 VITALS — BP 137/91 | HR 85 | Temp 96.1°F | Resp 18 | Wt 163.6 lb

## 2023-05-04 DIAGNOSIS — R911 Solitary pulmonary nodule: Secondary | ICD-10-CM

## 2023-05-04 DIAGNOSIS — E876 Hypokalemia: Secondary | ICD-10-CM | POA: Diagnosis not present

## 2023-05-04 DIAGNOSIS — Z87891 Personal history of nicotine dependence: Secondary | ICD-10-CM | POA: Diagnosis not present

## 2023-05-04 DIAGNOSIS — Z5111 Encounter for antineoplastic chemotherapy: Secondary | ICD-10-CM | POA: Diagnosis not present

## 2023-05-04 DIAGNOSIS — K6289 Other specified diseases of anus and rectum: Secondary | ICD-10-CM

## 2023-05-04 DIAGNOSIS — C20 Malignant neoplasm of rectum: Secondary | ICD-10-CM | POA: Diagnosis not present

## 2023-05-04 DIAGNOSIS — Z5112 Encounter for antineoplastic immunotherapy: Secondary | ICD-10-CM | POA: Diagnosis not present

## 2023-05-04 DIAGNOSIS — Z933 Colostomy status: Secondary | ICD-10-CM | POA: Diagnosis not present

## 2023-05-04 DIAGNOSIS — Z9221 Personal history of antineoplastic chemotherapy: Secondary | ICD-10-CM | POA: Diagnosis not present

## 2023-05-04 DIAGNOSIS — Z923 Personal history of irradiation: Secondary | ICD-10-CM | POA: Diagnosis not present

## 2023-05-04 DIAGNOSIS — Z79899 Other long term (current) drug therapy: Secondary | ICD-10-CM | POA: Diagnosis not present

## 2023-05-04 LAB — CMP (CANCER CENTER ONLY)
ALT: 18 U/L (ref 0–44)
AST: 16 U/L (ref 15–41)
Albumin: 3.7 g/dL (ref 3.5–5.0)
Alkaline Phosphatase: 82 U/L (ref 38–126)
Anion gap: 4 — ABNORMAL LOW (ref 5–15)
BUN: 15 mg/dL (ref 6–20)
CO2: 26 mmol/L (ref 22–32)
Calcium: 8.3 mg/dL — ABNORMAL LOW (ref 8.9–10.3)
Chloride: 106 mmol/L (ref 98–111)
Creatinine: 0.99 mg/dL (ref 0.61–1.24)
GFR, Estimated: >60 mL/min (ref >60)
Glucose, Bld: 104 mg/dL — ABNORMAL HIGH (ref 70–99)
Potassium: 3.3 mmol/L — ABNORMAL LOW (ref 3.5–5.1)
Sodium: 136 mmol/L (ref 135–145)
Total Bilirubin: 0.4 mg/dL (ref 0.3–1.2)
Total Protein: 7.2 g/dL (ref 6.5–8.1)

## 2023-05-04 LAB — CBC WITH DIFFERENTIAL (CANCER CENTER ONLY)
Abs Immature Granulocytes: 0.01 10*3/uL (ref 0.00–0.07)
Basophils Absolute: 0 10*3/uL (ref 0.0–0.1)
Basophils Relative: 0 %
Eosinophils Absolute: 0.1 10*3/uL (ref 0.0–0.5)
Eosinophils Relative: 4 %
HCT: 41.8 % (ref 39.0–52.0)
Hemoglobin: 14.6 g/dL (ref 13.0–17.0)
Immature Granulocytes: 0 %
Lymphocytes Relative: 26 %
Lymphs Abs: 0.8 10*3/uL (ref 0.7–4.0)
MCH: 32.2 pg (ref 26.0–34.0)
MCHC: 34.9 g/dL (ref 30.0–36.0)
MCV: 92.1 fL (ref 80.0–100.0)
Monocytes Absolute: 0.6 10*3/uL (ref 0.1–1.0)
Monocytes Relative: 19 %
Neutro Abs: 1.6 10*3/uL — ABNORMAL LOW (ref 1.7–7.7)
Neutrophils Relative %: 51 %
Platelet Count: 191 10*3/uL (ref 150–400)
RBC: 4.54 MIL/uL (ref 4.22–5.81)
RDW: 13.3 % (ref 11.5–15.5)
Smear Review: ADEQUATE
WBC Count: 3.1 10*3/uL — ABNORMAL LOW (ref 4.0–10.5)
nRBC: 0 % (ref 0.0–0.2)

## 2023-05-04 LAB — MAGNESIUM: Magnesium: 2.3 mg/dL (ref 1.7–2.4)

## 2023-05-04 MED ORDER — SODIUM CHLORIDE 0.9 % IV SOLN
6.0000 mg/kg | Freq: Once | INTRAVENOUS | Status: AC
  Administered 2023-05-04: 440 mg via INTRAVENOUS
  Filled 2023-05-04: qty 2

## 2023-05-04 MED ORDER — PALONOSETRON HCL INJECTION 0.25 MG/5ML
0.2500 mg | Freq: Once | INTRAVENOUS | Status: AC
  Administered 2023-05-04: 0.25 mg via INTRAVENOUS
  Filled 2023-05-04: qty 5

## 2023-05-04 MED ORDER — LIDOCAINE-PRILOCAINE 2.5-2.5 % EX CREA: 1.0000 | TOPICAL_CREAM | CUTANEOUS | 2 refills | Status: DC | PRN

## 2023-05-04 MED ORDER — FLUOROURACIL CHEMO INJECTION 2.5 GM/50ML
400.0000 mg/m2 | Freq: Once | INTRAVENOUS | Status: AC
  Administered 2023-05-04: 750 mg via INTRAVENOUS
  Filled 2023-05-04: qty 15

## 2023-05-04 MED ORDER — SODIUM CHLORIDE 0.9 % IV SOLN
180.0000 mg/m2 | Freq: Once | INTRAVENOUS | Status: AC
  Administered 2023-05-04: 340 mg via INTRAVENOUS
  Filled 2023-05-04: qty 17

## 2023-05-04 MED ORDER — ATROPINE SULFATE 1 MG/ML IV SOLN
0.5000 mg | Freq: Once | INTRAVENOUS | Status: AC | PRN
  Administered 2023-05-04: 0.5 mg via INTRAVENOUS
  Filled 2023-05-04: qty 1

## 2023-05-04 MED ORDER — SODIUM CHLORIDE 0.9 % IV SOLN
400.0000 mg/m2 | Freq: Once | INTRAVENOUS | Status: AC
  Administered 2023-05-04: 752 mg via INTRAVENOUS
  Filled 2023-05-04: qty 37.6

## 2023-05-04 MED ORDER — SODIUM CHLORIDE 0.9 % IV SOLN
2400.0000 mg/m2 | INTRAVENOUS | Status: DC
  Administered 2023-05-04: 5000 mg via INTRAVENOUS
  Filled 2023-05-04: qty 100

## 2023-05-04 MED ORDER — SODIUM CHLORIDE 0.9 % IV SOLN
Freq: Once | INTRAVENOUS | Status: AC
  Filled 2023-05-04: qty 250

## 2023-05-04 MED ORDER — POTASSIUM CHLORIDE CRYS ER 10 MEQ PO TBCR
10.0000 meq | EXTENDED_RELEASE_TABLET | Freq: Two times a day (BID) | ORAL | 0 refills | Status: DC
  Filled 2023-05-04: qty 30, 15d supply, fill #0

## 2023-05-04 MED ORDER — SODIUM CHLORIDE 0.9 % IV SOLN
10.0000 mg | Freq: Once | INTRAVENOUS | Status: AC
  Administered 2023-05-04: 10 mg via INTRAVENOUS
  Filled 2023-05-04: qty 10

## 2023-05-04 NOTE — Assessment & Plan Note (Addendum)
locally advanced rectal cancer-Stage IIIB, s/p TNT neoadjuvant protocol, concurrent Xeloda and radiation-Finished 08/27/2021, s/p  APR,rectal adenocarcinoma, [id number]. Positive radial margin due to perforation. Biopsy proven recurrent rectal cancer, locally advanced disease, possible colovesical fistula; no distant metastatic disease on PET- per [name] Surgeon Dr. Luciano Cutter, recurrence is not resectable.  S/p concurrent Xeloda 825 mg/m2 twice daily  with radiation, last radiation 6/14 MRI pelvis w wo contrast showed persistent disease. Disease is not resectable per colorectal surgeon  Labs are reviewed and discussed with patient. Proceed with FOLFIRI + Panitumumab

## 2023-05-04 NOTE — Progress Notes (Signed)
Hematology/Oncology Progress note [phone] [phone]) [phone]      CHIEF COMPLAINTS/REASON FOR VISIT:  Follow up for recurrent rectal cancer treatment  ASSESSMENT & PLAN:   Cancer Staging  Rectal cancer [facility]) Staging form: Colon and Rectum, AJCC 8th Edition - Clinical stage from 02/08/2021: Stage IIIB (cT4a, cN1, cM0) - Signed by [name], MD on 03/06/2021   Rectal cancer [facility]) locally advanced rectal cancer-Stage IIIB, s/p TNT neoadjuvant protocol, concurrent Xeloda and radiation-Finished 08/27/2021, s/p  APR,rectal adenocarcinoma, ypT3N0. Positive radial margin due to perforation. Biopsy proven recurrent rectal cancer, locally advanced disease, possible colovesical fistula; no distant metastatic disease on PET- per [name] Surgeon Dr. Luciano Cutter, recurrence is not resectable.  S/p concurrent Xeloda 825 mg/m2 twice daily  with radiation, last radiation 6/14 MRI pelvis w wo contrast showed persistent disease. Disease is not resectable per colorectal surgeon  Labs are reviewed and discussed with patient. Proceed with FOLFIRI + Panitumumab  Encounter for antineoplastic chemotherapy Chemotherapy next week.   Hypokalemia Continue potassium daily.   Rectal pain Recommend oxycodone 5-10 mg Q6 hours PRN.  he declined.Fentanyl patch,    Lung nodule Previously discussed with IR, CT-guided biopsy is difficult due to the position of small size. Negative on PET evaluation.  Attention on follow-up CT scans.     Orders Placed This Encounter  Procedures   CBC with Differential (Cancer Center Only)    Standing Status:   Future    Standing Expiration Date:   05/31/2024   CMP (Cancer Center only)    Standing Status:   Future    Standing Expiration Date:   05/31/2024   Magnesium    Standing Status:   Future    Standing Expiration Date:   05/31/2024     Follow-up  2 weeks All questions were answered. The patient knows to call the clinic with any problems, questions or  concerns.  [name], MD, PhD [facility] Hematology Oncology 05/04/2023        HISTORY OF PRESENTING ILLNESS:   Barry [name]. is a  57 y.o.  male presents for rectal cancer Oncology History  Rectal cancer (HCC)  02/08/2021 Initial Diagnosis   Rectal cancer   02/05/2021-02/06/2021 patient was hospitalized due to generalized weakness, intermittent lightheadedness, weight loss and worsening constipation.  02/05/2021 CT abdomen showed concerning of severe rectal wall thickening and right internal iliac lymph node concerning for metastatic disease.  Patient was seen by gastroenterology and had colonoscopy which showed a circumferential fungating mass in the rectum.  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   02/14/2021-02/16/2021 hospitalized due to rectal bleeding and pain.   02/14/2021 CT showed perirectal fluid collection/gas concerning for infection with significant leukocytosis, anemia with hemoglobin of 6.8.  Patient received PRBC transfusion, IV antibiotics.  He underwent an IR guided placement of JP drain into the rectal abscess.  Discharged home with oral Augmentin. 02/20/2021 presented to ER with new skin opening and draining from left gluteus.  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  JP drain has been removed.  Patient was continued on Augmentin.   02/13/2021, PET scan showed locally advanced rectal cancer with billowing of the mesorectum now with low attenuation material that was not present on previous examination with extensive stranding and inflammation.   Bulky RIGHT pelvic sidewall/hypogastric lymph node outside of the mesorectum with stippled calcification measuring 19 mm-no increased metabolic activity. Small LEFT hypogastric lymph node just peripheral to the internal external bifurcation-SUV 3.2 High RIGHT internal just below  or at the internal/common iliac transition, lymph node -SUV 4.8 LEFT high hypogastric lymph node  8 mm-SUV 3. Scattered  lymph nodes throughout the retroperitoneum with low FDG uptake Bilateral inguinal lymph nodes largest on the RIGHT (image 248/3) 11 mm with a maximum SUV of 2.8 spiculated nodule in the LEFT upper lobe- 9 x 8 mm       02/14/2021, CT abdomen pelvis without contrast Showed perforated rectal mass with contained perforation with fluid and gas extending above and below the pelvic floor,potentially involving the sphincter complex and extending into LEFT ischial rectal fossa   02/20/2021, CT pelvis with contrast showed Large perirectal/perianal abscess has markedly decreased in size since placement of the percutaneous drain. There are residual gas-filled collections in the soft tissues and suspect there is a fistula or sinus tract between the rectal mass and the subcutaneous tissues. Soft tissue gas along the medial left buttock and concern for a cutaneous ulceration in this area. Large rectal mass with evidence for a large necrotic right pelvic lymph node.   His case is complicated with a perirectal abscess. Prior to onset of perirectal abscess, on his 02/05/2021 scan, he was noted to have right internal iliac lymph node 3 x 1 x 2.3 cm which is concerning for nodal disease.On Subsequent images it was difficult to distinguish whether lymphadenopathy was due to nodal disease versus acute inflammation. 03/07/2021 MRI pelvis cT3 N2. Due to the possible contained perforation on previous CT, possible cT4 disease.    02/27/2021 medi port placed by Dr.Dew 03/13/2021 reports right butt cheek and also left perianal area fullness.he was seen by Dr.Pabon urgently  and had right buttock abscess drained. Left perianal fullness was felt to be due to cancer.    02/08/2021 Cancer Staging   Staging form: Colon and Rectum, AJCC 8th Edition - Clinical stage from 02/08/2021: Stage IIIB (cT4a, cN1, cM0) - Signed by [name], MD on 03/06/2021 Stage prefix: Initial diagnosis   02/27/2021 Procedure   medi port placed by Dr.Dew    03/18/2021 - 07/05/2021 Chemotherapy    FOLFOX q14d x 4 months      07/17/2021 - 08/27/2021 Chemotherapy   Xeloda concurrent with Radiation   11/18/2021 Surgery   patient is status post open APR with flap for rectal cancer. Pathology rectal adenomacarcinoma ypT3N0.  Positive margin: Radial (circumferential) or mesenteric: adjacent to perforation      01/27/2022 - 02/02/2022 Hospital Admission   Hospitalized at [facility] due to recurrent abscess.  US guided drain into the pelvic abscess with return of 60 ml of purulent fluid. Cultures positive for staph aureus and strep agalactiae group b, fungal cultures negative.    02/25/2022 Imaging   CT chest angiogram with and without contrast, CT abdomen pelvis with and without contrast 1. Negative for acute pulmonary embolus.2. Emphysema. Further decrease in size of the previously noted irregular nodule in the left upper lobe which is now barely measurable today. No new suspicious lung nodules 3. Status post left lower quadrant colostomy. Further decrease in size since comparison exam from May of the rim enhancing gas and fluid collection at the pelvic surgical bed extending from the perineum superiorly into the pelvis 4. Slightly thickened appearance of terminal ileal small bowel loops in the pelvis with mild stranding suggesting small bowel inflammatory process. 5. Stable enlarged right pelvic sidewall lymph node   06/02/2022 Imaging   CT chest abdomen pelvis w contrast 1. Status post abdominal perineal resection with descending colostomy. 2. At the site of  pelvic fluid and gas collection on 02/25/2022, there is residual, decreased presacral soft tissue fullness, without drainable collection. 3. Similar right obturator nodal metastasis.4.  No acute process or evidence of metastatic disease in the chest. 5. Age advanced coronary artery atherosclerosis. Recommend assessment of coronary risk factors. 6. Aortic atherosclerosis and emphysema      06/26/2022 Imaging   CT abdomen pelvis with contrast showed 1. Interval increase in size of a lobulated partially imaged at least 7.8 x 3.8 x 12 cm abscess in the perianal region in a patient status post abdominal perineal resection and left lower lobe end colostomy formation. Finding extends to involve the pre sacral region up to the urinary dome level. Associated posterior urinary bladder wall thickening with lack of intraperitoneal fat plane between the presacral soft tissue thickening/abscess formation suggestive of possible fistulization and invasion of the posterior bladder wall. Underlying recurrent malignancy is not excluded. 2. Stable 2.7 cm right pelvic sidewall lymph node. 3.  Aortic Atherosclerosis   06/26/2022 - 06/27/2022 Hospital Admission   He went to [facility] ER and CT showed recurrent abscess. He was transferred to [facility] due to recurrent abscess, treated with IV vanc/zosyn, IR was consulted and drainage tube was placed. Wound grew up Strep viridans. Patient was seen by ID at [facility] discharged with Augmentin for 2 weeks.   He was seen by wound care Dr> Kandace Blitz on 07/09/2022, JP drain was removed.    09/29/2022 Imaging   MRI pelvis without contrast showed  Interval abdominoperitoneal resection since prior MRI. Decreased small fluid collection in the surgical bed compared with more recent CT, consistent with resolving postoperative fluid collection or abscess.   Bulky rounded presacral mass, which shows diffuse restricted diffusion, without significant change in size since most recent CT of 06/26/2022. This raises suspicion for recurrent carcinoma over post treatment changes. Recommend correlation with CEA level, and consider tissue sampling or PET-CT.   Stable enlarged right pelvic sidewall lymph node. No new or increased adenopathy identified.   10/09/2022 Imaging   CT chest with contrast showed 1. Unchanged 0.4 cm fissural nodule of the anterior left lower lobe. This is almost  certainly a benign fissural lymph node. No new or suspicious pulmonary nodules. 2. Moderate emphysema and diffuse bilateral bronchial wall thickening. 3. Coronary artery disease.   10/13/2022 Imaging   CT abdomen pelvis with contrast showed 1. Complex collection persists in the lower pelvis, extending from the anal verge upwards into the presacral space and anteriorly from the presacral space to the bladder dome, not significantly changed in size or extent compared to the earlier CT of 06/26/2022. This is most likely a combination of a postoperative seroma and/or chronic phlegmon/abscess versus recurrent abscess. 2. Bladder walls are thick walled/edematous. Given the contiguity of the presacral collection and the bladder dome, this is highly suspicious for a related bladder wall infection and possibly secondary to colovesical fistula. Recommend correlation with urinalysis. 3. No evidence of bowel obstruction. LEFT lower abdominal wall colostomy, without obstruction or inflammatory change. 4. No free intraperitoneal air   10/13/2022 [facility] Admission   Hospitalized due to fever and rectal pain. Urine culture showed mixed urogenital flora. IR CT guided Aspiration of abscess showed Streptococcal Viridans. He was placed back on Augmentin  10/14/2023 CT read by [facility] radiology Postsurgical changes of abdominoperineal resection. Interval enlargement of  fluid collection in the surgical bed. Multiple foci of enhancing tissue at  the margins of this fluid collection, increased from prior study, suspicious for local  recurrence.   Prominent retroperitoneal lymph nodes, some which are increased in size from prior study. These are indeterminate and may be either reactive or represent metastatic disease. Recommend continued attention on follow-up.   Centrally hypoattenuating right obturator lymph node, unchanged in size, consistent with treated disease.   Circumferential bladder wall thickening and irregularity,  most pronounced on the left side. This may be reactive in the setting of adjacent pelvic inflammatory changes. Correlate with urinalysis if there is clinical  concern for urinary tract infection.    11/14/2022 Imaging   MRI pelvis w wo contrast  1. Status post abdominoperineal resection with left lower quadrant end colostomy. 2. Compared to prior CT and MR, no significant change in appearance of heterogeneous, rim enhancing presacral and low pelvic soft tissue and fluid. Largest heterogeneously enhancing conglomerate of fluid and soft tissue appears to closely involve the anteriorly abutting the seminal vesicles and measures 3.0 x 2.9 cm in largest axial dimension. This extends to superiorly contact the posterior bladder dome and inferiorly towards the gluteal cleft. 3. Discrete fluid component at the most inferior extent measuring 2.4 x 1.3 cm. This is markedly diminished in volume compared to more remote previous examinations, for example 06/26/2022. 4. Constellation of findings is highly concerning for locally recurrent rectal malignancy with or without superimposed infection. Presence or absence of infection at this time is not established by MR. 5. Additional unchanged hemorrhagic or proteinaceous fluid collection in the right hemipelvis measuring 3.2 x 2.6 cm most consistent with postoperative hematoma or seroma. 6. Unchanged thickening of the urinary bladder wall. As previously reported, fistula or involvement of bladder wall by malignancy not excluded.   12/09/2022 Procedure   CT guided biopsy of the pelvic mass showed Moderately differentiated adenocarcinoma with dirty necrosis, morphologically identical to patient's prior rectal adenocarcinoma.   [facility] NGS xT 648 panel showed MLH3 start loss,  BCORL1 frame shift, TP53 splice region varian, APC stop gain,  TMB 7.9, MS stable, KRAS/BRAF/NRAS negative.   [facility] NGS xR showed no gene arrangement or reportable altered splicing events in RNA  sequencing      01/07/2023 -  Chemotherapy   Xeloda 825 mg/m2 twice daily  with radiation   02/11/2023 Imaging   CT abdomen pelvis w contrast  1. Examination is positive for small bowel obstruction. Transition point is identified within the left iliac fossa. Distal to the transition point the small bowel loops exhibit mucosal enhancement and wall thickening concerning for enteritis. 2. There is new right-sided hydronephrosis and hydroureter up to the level of the bifurcation of right common iliac artery. The distal right ureter appears closely associated with the previously characterized tracer avid presacral soft tissue mass. Cannot exclude obstructive uropathy secondary to tumor involvement. 3. Similar appearance of presacral soft tissue mass. This was tracer avid on the recent PET-CT from 12/17/2022, and concerning for locally recurrent tumor. 4. Unchanged diffuse circumferential wall thickening involving the urinary bladder. 5. Aortic Atherosclerosis    04/17/2023 -  Chemotherapy   Patient is on Treatment Plan : COLORECTAL FOLFIRI + Panitumumab q14d       Patient presented to emergency room on 12/08/21, rectal pain.  CT scan showed 18.3 cm fluid and gas collection in the pelvic surgical bed compatible with infected collection/abscess.  Patient was sent to [facility] and admitted for.  Patient has CT-guided drain placement on 12/09/2021.  Blood culture positive for Staph epidermidis which was felt to be contaminant.  Fluid culture grew pansensitive Staph aureus, strep pyogenes and  candida albicans.  Patient was treated with IV antibiotics and transition to p.o. Augmentin and fluconazole on 12/11/2021.  #Perirectal abscess status post drainage catheter , patient was recently seen by [facility] oncology surgeon on 12/24/2021 and was recommended to keep drainage catheter and continue antibiotics. Patient had a repeat CT scan done which showed a smaller but persistent abscess.  He will return to [facility] surgery for  follow-up of drain removal. His case was discussed on [name] tumor board for the positive mesenteric margin which was felt to be secondary to previous perforation.  Recommend surveillance.  02/11/23 patient was admitted due to nausea, CT showed small bowel obstruction, severe constipation, NG tube was placed. His symptoms improved and has ostomy output, NG was removed and he tolerated PO.   During the interval ER visit due to abdominal pain.  03/02/23 CT abdomen pelvis w contrast showed . Findings are again suggestive of probable partial small bowel obstruction which appears likely related to enteritis. Specifically, there are multiple areas of mural thickening throughout the distal small bowel with increased mucosal and mural enhancement, with some very mild proximal small bowel dilatation. 2. Status post APR with left lower quadrant colostomy and large soft tissue mass in the low anatomic pelvis which is slightly more prominent than the recent prior study, and was previously hypermetabolic on prior PET-CT 12/17/2022, suggesting residual disease. 3. Probable nodal mass along the right pelvic sidewall in the right internal iliac nodal distribution, similar to the prior study, previously not hypermetabolic on prior PET-CT. No other lymphadenopathy or definitive signs of metastatic disease noted elsewhere on today's examination.  4. Aortic atherosclerosis, as well as calcified atherosclerotic plaque in the right coronary artery. Please note that although the presence of coronary artery calcium documents the presence of coronary artery disease, the severity of this disease and any potential stenosis cannot be assessed on this non-gated CT examination. Assessment for potential risk factor modification, dietary therapy or pharmacologic therapy may be warranted, if clinically indicated. 5. Additional incidental findings, as above. Patient has radiation induced enteritis. His symptoms were mild he was discharged  home.   INTERVAL HISTORY Jeet Hearing. is a 57 y.o. male who has above history reviewed by me today presents for follow up visit for management of recurrent locally advanced rectal cancer.  He feels  good today, Rectal pain is controlled, good appetite.  Denies fever chills, rectal pain. Denies rectal discharge.  Denies nausea vomiting or abdominal pain.      Review of Systems  Constitutional:  Negative for appetite change, chills, fatigue, fever and unexpected weight change.  HENT:   Negative for hearing loss and voice change.   Eyes:  Negative for eye problems and icterus.  Respiratory:  Negative for chest tightness, cough and shortness of breath.   Cardiovascular:  Negative for chest pain and leg swelling.  Gastrointestinal:  Negative for abdominal distention, abdominal pain, blood in stool, constipation, diarrhea and rectal pain.       Rectal pain  Endocrine: Negative for hot flashes.  Genitourinary:  Negative for difficulty urinating, dysuria and frequency.   Musculoskeletal:  Negative for arthralgias.  Skin:  Negative for itching and rash.  Neurological:  Negative for light-headedness and numbness.  Hematological:  Negative for adenopathy. Does not bruise/bleed easily.  Psychiatric/Behavioral:  Negative for confusion.     MEDICAL HISTORY:  Past Medical History:  Diagnosis Date   Headache    Left shoulder pain    Rectal cancer (HCC)  SURGICAL HISTORY: Past Surgical History:  Procedure Laterality Date   COLON SURGERY     COLONOSCOPY WITH PROPOFOL N/A 02/06/2021   Procedure: COLONOSCOPY WITH PROPOFOL;  Surgeon: Toney Reil, MD;  Location[facility] ENDOSCOPY;  Service: Gastroenterology;  Laterality: N/A;   PORTA CATH INSERTION N/A 02/27/2021   Procedure: PORTA CATH INSERTION;  Surgeon: Annice Needy, MD;  Location: [facility] INVASIVE CV LAB;  Service: Cardiovascular;  Laterality: N/A;   ROTATOR CUFF REPAIR Left     SOCIAL HISTORY: Social History    Socioeconomic History   Marital status: Married    Spouse name: Not on file   Number of children: Not on file   Years of education: Not on file   Highest education level: Not on file  Occupational History   Not on file  Tobacco Use   Smoking status: Former    Current packs/day: 0.00    Average packs/day: 1 pack/day for 10.0 years (10.0 ttl pk-yrs)    Types: Cigars, Cigarettes    Start date: 02/05/2011    Quit date: 02/04/2021    Years since quitting: 2.2   Smokeless tobacco: Never  Substance and Sexual Activity   Alcohol use: Not Currently   Drug use: No   Sexual activity: Not on file  Other Topics Concern   Not on file  Social History Narrative   Not on file   Social Determinants of Health   Financial Resource Strain: High Risk (02/13/2023)   Received from [facility], [facility]   Overall Financial Resource Strain (CARDIA)    Difficulty of Paying Living Expenses: Hard  Food Insecurity: No Food Insecurity (02/13/2023)   Received from [facility], [facility]   Hunger Vital Sign    Worried About Running Out of Food in the Last Year: Never true    Ran Out of Food in the Last Year: Never true  Transportation Needs: No Transportation Needs (02/13/2023)   Received from [facility], [facility]   [facility] - Transportation    In the past 12 months, has lack of transportation kept you from medical appointments or from getting medications?: No    Lack of Transportation (Non-Medical): No  Physical Activity: Inactive (11/03/2022)   Exercise Vital Sign    Days of Exercise per Week: 0 days    Minutes of Exercise per Session: 0 min  Stress: Stress Concern Present (11/03/2022)   [employer] of Occupational Health - Occupational Stress Questionnaire    Feeling of Stress : Rather much  Social Connections: Moderately Isolated (11/03/2022)   Social Connection and Isolation Panel  [NHANES]    Frequency of Communication with Friends and Family: Twice a week    Frequency of Social Gatherings with Friends and Family: Three times a week    Attends Religious Services: 1 to 4 times per year    Active Member of Clubs or Organizations: No    Attends Banker Meetings: Never    Marital Status: Separated  Intimate Partner Violence: Not At Risk (11/03/2022)   Humiliation, Afraid, Rape, and Kick questionnaire    Fear of Current or Ex-Partner: No    Emotionally Abused: No    Physically Abused: No    Sexually Abused: No    FAMILY HISTORY: Family History  Problem Relation Age of Onset   Cancer Sister    Diabetes Mother    Cancer Maternal Grandmother    Cancer Paternal Grandmother  ALLERGIES:  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  MEDICATIONS:  Current Outpatient Medications  Medication Sig Dispense Refill   calcium-vitamin D (OSCAL WITH D) 500-5 MG-MCG tablet Take 2 tablets by mouth daily. 60 tablet 3   citalopram (CELEXA) 10 MG tablet Take 1 tablet (10 mg total) by mouth daily. 30 tablet 3   loperamide (IMODIUM) 2 MG capsule Take 2 capsules (4mg ) by mouth initially. Then take 1 capsule by mouth every 2 hours (4mg  every 4 hours at night). Take no more than 16mg /day 90 capsule 2   ondansetron (ZOFRAN) 8 MG tablet Take 1 tablet (8 mg total) by mouth every 8 (eight) hours as needed for nausea or vomiting. 90 tablet 1   oxyCODONE (ROXICODONE) 5 MG immediate release tablet Take 1-2 tablets (5-10 mg total) by mouth every 6 (six) hours as needed for severe pain. 90 tablet 0   potassium chloride (KLOR-CON M) 10 MEQ tablet Take 1 tablet (10 mEq total) by mouth 2 (two) times daily. 30 tablet 0   prochlorperazine (COMPAZINE) 10 MG tablet Take 1 tablet (10 mg total) by mouth every 6 (six) hours as needed for nausea or vomiting. 90 tablet 1   lidocaine-prilocaine (EMLA) cream Apply 1 Application topically as needed. Apply small amount to port and cover with saran wrap 1-2  hours prior to port access 30 g 2   nicotine (NICODERM CQ - DOSED IN MG/24 HR) 7 mg/24hr patch Place 1 patch (7 mg total) onto the skin daily. (Patient not taking: Reported on 05/04/2023) 28 patch 2   senna (SENOKOT) 8.6 MG TABS tablet Take 2 tablets (17.2 mg total) by mouth daily. (Patient not taking: Reported on 04/28/2023) 100 tablet 0   traZODone (DESYREL) 50 MG tablet Take 1 tablet (50 mg total) by mouth at bedtime as needed for sleep. (Patient not taking: Reported on 04/28/2023) 30 tablet 3   No current facility-administered medications for this visit.   Facility-Administered Medications Ordered in Other Visits  Medication Dose Route Frequency Provider Last Rate Last Admin   fluorouracil (ADRUCIL) 5,000 mg in sodium chloride 0.9 % 150 mL chemo infusion  2,400 mg/m2 (Treatment Plan Recorded) Intravenous 1 day or 1 dose [name], MD   5,000 mg at 05/04/23 1246   heparin lock flush 100 unit/mL  500 Units Intravenous Once [name], MD         PHYSICAL EXAMINATION: ECOG PERFORMANCE STATUS: 1 - Symptomatic but completely ambulatory Vitals:   05/04/23 0848  BP: (!) 137/91  Pulse: 85  Resp: 18  Temp: (!) 96.1 F (35.6 C)  SpO2: 93%     Filed Weights   05/04/23 0848  Weight: 163 lb 9.6 oz (74.2 kg)      Physical Exam HENT:     Head: Normocephalic and atraumatic.  Eyes:     General: No scleral icterus. Cardiovascular:     Rate and Rhythm: Normal rate and regular rhythm.     Heart sounds: Normal heart sounds.  Pulmonary:     Effort: Pulmonary effort is normal. No respiratory distress.     Breath sounds: No wheezing.     Comments: Decreased breath sound bilaterally.  Abdominal:     General: Bowel sounds are normal. There is no distension.     Palpations: Abdomen is soft.     Comments: Colostomy bag  Genitourinary:    Comments: Status post APR, Musculoskeletal:        General: No deformity. Normal range of motion.     Cervical  back: Normal range of motion and neck supple.   Skin:    General: Skin is warm and dry.     Findings: No erythema or rash.  Neurological:     Mental Status: He is alert and oriented to person, place, and time. Mental status is at baseline.     Cranial Nerves: No cranial nerve deficit.     Coordination: Coordination normal.  Psychiatric:        Mood and Affect: Mood normal.     LABORATORY DATA:  I have reviewed the data as listed    Latest Ref Rng & Units 05/04/2023    8:15 AM 04/28/2023    8:53 AM 04/17/2023    8:15 AM  CBC  WBC 4.0 - 10.5 K/uL 3.1  2.8  4.4   Hemoglobin 13.0 - 17.0 g/dL 16.1  09.6  04.5   Hematocrit 39.0 - 52.0 % 41.8  42.2  44.0   Platelets 150 - 400 K/uL 191  211  231       Latest Ref Rng & Units 05/04/2023    8:15 AM 04/28/2023    8:53 AM 04/17/2023    8:15 AM  CMP  Glucose 70 - 99 mg/dL 409  811  914   BUN 6 - 20 mg/dL 15  8  8    Creatinine 0.61 - 1.24 mg/dL 7.82  9.56  2.13   Sodium 135 - 145 mmol/L 136  134  136   Potassium 3.5 - 5.1 mmol/L 3.3  3.5  3.2   Chloride 98 - 111 mmol/L 106  104  106   CO2 22 - 32 mmol/L 26  24  21    Calcium 8.9 - 10.3 mg/dL 8.3  8.9  8.3   Total Protein 6.5 - 8.1 g/dL 7.2  7.6  7.5   Total Bilirubin 0.3 - 1.2 mg/dL 0.4  0.2  0.4   Alkaline Phos 38 - 126 U/L 82  80  87   AST 15 - 41 U/L 16  20  19    ALT 0 - 44 U/L 18  20  15       RADIOGRAPHIC STUDIES: I have personally reviewed the radiological images as listed and agreed with the findings in the report. MR PELVIS W WO CONTRAST  Result Date: 04/07/2023 CLINICAL DATA:  Recurrent rectal carcinoma. Presacral mass. Prior APR EXAM: MRI PELVIS WITHOUT AND WITH CONTRAST TECHNIQUE: Multiplanar multisequence MR imaging of the pelvis was performed both before and after administration of intravenous contrast. CONTRAST:  7mL GADAVIST GADOBUTROL 1 MMOL/ML IV SOLN COMPARISON:  CT on 03/02/2023 and MRI on 11/14/2022 FINDINGS: Lower Urinary Tract: Diffuse bladder wall thickening is stable, and may be due to previous radiation therapy  or cystitis. Bowel: Postop changes from previous APR. Poorly defined and rounded masslike lesion is seen in the region of the Hartmann's pouch which shows peripheral nodular enhancement. This measures 5.0 x 3.8 cm on image 51/22, without significant change compared to most recent CT, but increased from earlier MRI when it measured approximately 3.0 x 2.9 cm. This is highly suspicious for recurrent rectal carcinoma. Vascular/Lymphatic: Right internal iliac lymphadenopathy measures 3.1 x 2.4 cm on image 46/14, stable compared to prior CT and earlier MRI. No other pathologically enlarged lymph nodes identified. Reproductive:  No mass or other significant abnormality. Other: None. Musculoskeletal: No suspicious bone lesions identified. IMPRESSION: 5 cm masslike lesion in the region of the Hartmann's pouch, increased in size from earlier MRI several months ago, and highly suspicious  for recurrent rectal carcinoma. Stable right internal iliac lymphadenopathy, consistent with metastatic disease. Stable diffuse bladder wall thickening, which may be due to previous radiation therapy or other etiology of cystitis. Electronically Signed   By: Danae Orleans M.D.   On: 04/07/2023 16:35   CT ABDOMEN PELVIS W CONTRAST  Result Date: 03/02/2023 CLINICAL DATA:  57 year old male with history of acute onset of nonlocalized abdominal pain. History of rectal cancer. * Tracking Code: BO * EXAM: CT ABDOMEN AND PELVIS WITH CONTRAST TECHNIQUE: Multidetector CT imaging of the abdomen and pelvis was performed using the standard protocol following bolus administration of intravenous contrast. RADIATION DOSE REDUCTION: This exam was performed according to the departmental dose-optimization program which includes automated exposure control, adjustment of the mA and/or kV according to patient size and/or use of iterative reconstruction technique. CONTRAST:  75mL OMNIPAQUE IOHEXOL 350 MG/ML SOLN COMPARISON:  CT of the abdomen and pelvis  02/11/2023. FINDINGS: Lower chest: Atherosclerotic calcifications in the right coronary artery. Hepatobiliary: 1.3 cm partially exophytic low-attenuation lesion associated with segment 6 of the liver (axial image 29 of series 3), similar to the prior study, compatible with a small cyst. No other suspicious appearing hepatic lesions are noted. No intra or extrahepatic biliary ductal dilatation. Gallbladder is unremarkable in appearance. Pancreas: No pancreatic mass. No pancreatic ductal dilatation. No pancreatic or peripancreatic fluid collections or inflammatory changes. Spleen: Unremarkable. Adrenals/Urinary Tract: Subcentimeter low-attenuation lesions in both kidneys, too small to definitively characterize, but statistically likely to represent tiny cysts (no imaging follow-up recommended). No other suspicious appearing renal lesions are noted. No hydroureteronephrosis. Bilateral adrenal glands are normal in appearance. Urinary bladder wall appears severely thickened, most evident posteriorly. Mild haziness in the perivesical fat. Stomach/Bowel: The appearance of the stomach is unremarkable. There continues to be extensive mural thickening and increased mucosal and mural enhancement throughout portions of the distal small bowel. Proximal loops of small bowel are mildly dilated measuring up to 3.1 cm in diameter. The degree of small-bowel dilatation appears slightly increased compared to the prior study. Some gas and stool is noted in the colon. 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 There is a poorly defined soft tissue mass (previously hypermetabolic on prior PET-CT 12/17/2022) in the low anatomic pelvis which appears intimately associated with the seminal vesicles, and makes contact with the posterior aspect of the urinary bladder wall, similar to the prior study best appreciated on axial image 77 of series 3 and sagittal image 99 of series 7, currently estimated to measure  approximately 5.3 x 4.9 x 7.6 cm. Vascular/Lymphatic: Atherosclerosis in the abdominal aorta and pelvic vasculature, without evidence of aneurysm or dissection. Heterogeneously enhancing nodal mass along the right pelvic sidewall measuring 2.8 x 3.2 cm (axial image 74 of series 3), similar to the recent prior study (and not previously hypermetabolic on prior PET-CT). No other definite lymphadenopathy confidently identified elsewhere in the abdomen or pelvis. Reproductive: Prostate gland and seminal vesicles are distorted by postoperative and postradiation changes in the pelvis, but the previously described pelvic mass is intimately associated with the seminal vesicles. Other: No significant volume of ascites.  No pneumoperitoneum. Musculoskeletal: There are no aggressive appearing lytic or blastic lesions noted in the visualized portions of the skeleton. IMPRESSION: 1. Findings are again suggestive of probable partial small bowel obstruction which appears likely related to enteritis. Specifically, there are multiple areas of mural thickening throughout the distal small bowel with increased mucosal and mural enhancement, with some very mild proximal small  bowel dilatation. 2. Status post APR with left lower quadrant colostomy and large soft tissue mass in the low anatomic pelvis which is slightly more prominent than the recent prior study, and was previously hypermetabolic on prior PET-CT 12/17/2022, suggesting residual disease. 3. Probable nodal mass along the right pelvic sidewall in the right internal iliac nodal distribution, similar to the prior study, previously not hypermetabolic on prior PET-CT. No other lymphadenopathy or definitive signs of metastatic disease noted elsewhere on today's examination. 4. Aortic atherosclerosis, as well as calcified atherosclerotic plaque in the right coronary artery. Please note that although the presence of coronary artery calcium documents the presence of coronary artery  disease, the severity of this disease and any potential stenosis cannot be assessed on this non-gated CT examination. Assessment for potential risk factor modification, dietary therapy or pharmacologic therapy may be warranted, if clinically indicated. 5. Additional incidental findings, as above. Electronically Signed   By: Trudie Reed M.D.   On: 03/02/2023 07:01   DG Abdomen 1 View  Result Date: 02/11/2023 CLINICAL DATA:  Nasogastric tube placement. EXAM: ABDOMEN - 1 VIEW COMPARISON:  None Available. FINDINGS: The bowel gas pattern is normal. Distal tip of nasogastric tube is seen in expected position of proximal stomach. Residual contrast is noted in mildly dilated right intrarenal collecting system and proximal ureter and urinary bladder. IMPRESSION: Distal tip of nasogastric tube is seen in expected position of proximal stomach. Electronically Signed   By: Lupita Raider M.D.   On: 02/11/2023 10:18   CT ABDOMEN PELVIS W CONTRAST  Result Date: 02/11/2023 CLINICAL DATA:  History of rectal cancer. Evaluate for bowel obstruction. * Tracking Code: BO * EXAM: CT ABDOMEN AND PELVIS WITH CONTRAST TECHNIQUE: Multidetector CT imaging of the abdomen and pelvis was performed using the standard protocol following bolus administration of intravenous contrast. RADIATION DOSE REDUCTION: This exam was performed according to the departmental dose-optimization program which includes automated exposure control, adjustment of the mA and/or kV according to patient size and/or use of iterative reconstruction technique. CONTRAST:  OMNIPAQUE IOHEXOL 300 MG/ML  SOLN COMPARISON:  PET-CT 12/17/2022 FINDINGS: Lower chest: No acute abnormality. Hepatobiliary: Unchanged cyst along the inferior margin of the right lobe of liver measuring 1.2 cm. No suspicious liver lesions identified at this time. Gallbladder appears normal without bile duct dilatation. Pancreas: Unremarkable. No pancreatic ductal dilatation or surrounding  inflammatory changes. Spleen: Normal in size without focal abnormality. Adrenals/Urinary Tract: Normal adrenal glands. No nephrolithiasis. Several too small to characterize kidney lesions are identified. No follow-up imaging recommended. There is new right-sided hydronephrosis and hydroureter up to the level of the bifurcation of right common iliac artery. The distal right ureter appears closely associated with the presacral soft tissue mass, image 62/2. Normal appearance of the left scratch set no left-sided hydronephrosis. There is diffuse circumferential wall thickening involving the urinary bladder. Stomach/Bowel: Stomach appears normal. Colostomy is identified within the left lower abdominal wall, just left of the midline. The appendix is visualized and appears normal. Within the left hemiabdomen there are several loops of small bowel which exhibit wall thickening, which measures up to 8 mm in thickness, image 60/2. Within the left hemiabdomen and extending into the pelvis there is a dilated loop of mid small bowel with fecalized is a shin. This measures up to 3.2 cm. Transition to decreased caliber distal small bowel noted within the left iliac fossa. The small bowel loops distal to the transition point exhibit mucosal enhancement and wall thickening which measures  up to 8 mm in thickness. Normal caliber of the colon up to the level of the ostomy. Vascular/Lymphatic: Aortic atherosclerosis without aneurysm. No pelvic or inguinal adenopathy. Reproductive: No mass. 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 This was tracer avid on the PET-CT from 12/17/2022 measuring 5.7 x 3.9 cm, similar to the previous exam. Within the right ovoid hypoattenuating lesion in the right posterior pelvis (not FDG avid on recent PET-CT) measures 3.3 x 2.6 cm, image 70/2. Previously this measured the same. No significant free fluid or fluid collections. No signs of pneumoperitoneum. Musculoskeletal: No acute or significant  osseous findings. IMPRESSION: 1. Examination is positive for small bowel obstruction. Transition point is identified within the left iliac fossa. Distal to the transition point the small bowel loops exhibit mucosal enhancement and wall thickening concerning for enteritis. 2. There is new right-sided hydronephrosis and hydroureter up to the level of the bifurcation of right common iliac artery. The distal right ureter appears closely associated with the previously characterized tracer avid presacral soft tissue mass. Cannot exclude obstructive uropathy secondary to tumor involvement. 3. Similar appearance of presacral soft tissue mass. This was tracer avid on the recent PET-CT from 12/17/2022, and concerning for locally recurrent tumor. 4. Unchanged diffuse circumferential wall thickening involving the urinary bladder. 5. Aortic Atherosclerosis (ICD10-I70.0). Electronically Signed   By: Signa Kell M.D.   On: 02/11/2023 07:47

## 2023-05-04 NOTE — Assessment & Plan Note (Addendum)
 Continue potassium daily.

## 2023-05-04 NOTE — Assessment & Plan Note (Signed)
Chemotherapy next week.

## 2023-05-04 NOTE — Patient Instructions (Signed)

## 2023-05-04 NOTE — Assessment & Plan Note (Signed)
Previously discussed with IR, CT-guided biopsy is difficult due to the position of small size. Negative on PET evaluation.  Attention on follow-up CT scans.

## 2023-05-04 NOTE — Assessment & Plan Note (Signed)
 Recommend oxycodone 5-10 mg Q6 hours PRN.  he declined.Fentanyl patch,

## 2023-05-06 ENCOUNTER — Inpatient Hospital Stay: Payer: 59

## 2023-05-06 VITALS — BP 135/96 | HR 98 | Temp 99.0°F | Resp 18

## 2023-05-06 DIAGNOSIS — Z5112 Encounter for antineoplastic immunotherapy: Secondary | ICD-10-CM | POA: Diagnosis not present

## 2023-05-06 DIAGNOSIS — C20 Malignant neoplasm of rectum: Secondary | ICD-10-CM | POA: Diagnosis not present

## 2023-05-06 DIAGNOSIS — Z79899 Other long term (current) drug therapy: Secondary | ICD-10-CM | POA: Diagnosis not present

## 2023-05-06 DIAGNOSIS — Z933 Colostomy status: Secondary | ICD-10-CM | POA: Diagnosis not present

## 2023-05-06 DIAGNOSIS — Z5111 Encounter for antineoplastic chemotherapy: Secondary | ICD-10-CM | POA: Diagnosis not present

## 2023-05-06 DIAGNOSIS — K6289 Other specified diseases of anus and rectum: Secondary | ICD-10-CM | POA: Diagnosis not present

## 2023-05-06 DIAGNOSIS — E876 Hypokalemia: Secondary | ICD-10-CM | POA: Diagnosis not present

## 2023-05-06 DIAGNOSIS — Z923 Personal history of irradiation: Secondary | ICD-10-CM | POA: Diagnosis not present

## 2023-05-06 DIAGNOSIS — Z9221 Personal history of antineoplastic chemotherapy: Secondary | ICD-10-CM | POA: Diagnosis not present

## 2023-05-06 DIAGNOSIS — R911 Solitary pulmonary nodule: Secondary | ICD-10-CM | POA: Diagnosis not present

## 2023-05-06 DIAGNOSIS — Z87891 Personal history of nicotine dependence: Secondary | ICD-10-CM | POA: Diagnosis not present

## 2023-05-06 MED ORDER — SODIUM CHLORIDE 0.9% FLUSH
10.0000 mL | INTRAVENOUS | Status: DC | PRN
  Administered 2023-05-06: 10 mL
  Filled 2023-05-06: qty 10

## 2023-05-06 MED ORDER — HEPARIN SOD (PORK) LOCK FLUSH 100 UNIT/ML IV SOLN
500.0000 [IU] | Freq: Once | INTRAVENOUS | Status: AC | PRN
  Administered 2023-05-06: 500 [IU]
  Filled 2023-05-06: qty 5

## 2023-05-13 ENCOUNTER — Encounter: Payer: Self-pay | Admitting: Oncology

## 2023-05-15 MED FILL — Dexamethasone Sodium Phosphate Inj 100 MG/10ML: INTRAMUSCULAR | Qty: 1 | Status: AC

## 2023-05-18 ENCOUNTER — Encounter: Payer: Self-pay | Admitting: Oncology

## 2023-05-18 ENCOUNTER — Inpatient Hospital Stay (HOSPITAL_BASED_OUTPATIENT_CLINIC_OR_DEPARTMENT_OTHER): Payer: 59 | Admitting: Oncology

## 2023-05-18 ENCOUNTER — Inpatient Hospital Stay: Payer: 59 | Attending: Oncology

## 2023-05-18 ENCOUNTER — Inpatient Hospital Stay: Payer: 59

## 2023-05-18 VITALS — BP 136/103 | HR 87 | Temp 97.6°F | Resp 18 | Wt 164.7 lb

## 2023-05-18 DIAGNOSIS — G893 Neoplasm related pain (acute) (chronic): Secondary | ICD-10-CM | POA: Diagnosis not present

## 2023-05-18 DIAGNOSIS — Z5111 Encounter for antineoplastic chemotherapy: Secondary | ICD-10-CM

## 2023-05-18 DIAGNOSIS — R911 Solitary pulmonary nodule: Secondary | ICD-10-CM

## 2023-05-18 DIAGNOSIS — K6289 Other specified diseases of anus and rectum: Secondary | ICD-10-CM | POA: Diagnosis not present

## 2023-05-18 DIAGNOSIS — Z79891 Long term (current) use of opiate analgesic: Secondary | ICD-10-CM | POA: Diagnosis not present

## 2023-05-18 DIAGNOSIS — C20 Malignant neoplasm of rectum: Secondary | ICD-10-CM | POA: Diagnosis not present

## 2023-05-18 DIAGNOSIS — Z5112 Encounter for antineoplastic immunotherapy: Secondary | ICD-10-CM | POA: Insufficient documentation

## 2023-05-18 DIAGNOSIS — Z923 Personal history of irradiation: Secondary | ICD-10-CM | POA: Diagnosis not present

## 2023-05-18 DIAGNOSIS — E876 Hypokalemia: Secondary | ICD-10-CM | POA: Insufficient documentation

## 2023-05-18 DIAGNOSIS — R110 Nausea: Secondary | ICD-10-CM

## 2023-05-18 LAB — CMP (CANCER CENTER ONLY)
ALT: 20 U/L (ref 0–44)
AST: 20 U/L (ref 15–41)
Albumin: 3.9 g/dL (ref 3.5–5.0)
Alkaline Phosphatase: 87 U/L (ref 38–126)
Anion gap: 7 (ref 5–15)
BUN: 12 mg/dL (ref 6–20)
CO2: 25 mmol/L (ref 22–32)
Calcium: 8.6 mg/dL — ABNORMAL LOW (ref 8.9–10.3)
Chloride: 104 mmol/L (ref 98–111)
Creatinine: 0.87 mg/dL (ref 0.61–1.24)
GFR, Estimated: >60 mL/min (ref >60)
Glucose, Bld: 103 mg/dL — ABNORMAL HIGH (ref 70–99)
Potassium: 3.6 mmol/L (ref 3.5–5.1)
Sodium: 136 mmol/L (ref 135–145)
Total Bilirubin: 0.6 mg/dL (ref 0.3–1.2)
Total Protein: 7.8 g/dL (ref 6.5–8.1)

## 2023-05-18 LAB — CBC WITH DIFFERENTIAL (CANCER CENTER ONLY)
Abs Immature Granulocytes: 0.01 10*3/uL (ref 0.00–0.07)
Basophils Absolute: 0 10*3/uL (ref 0.0–0.1)
Basophils Relative: 1 %
Eosinophils Absolute: 0.1 10*3/uL (ref 0.0–0.5)
Eosinophils Relative: 3 %
HCT: 42.3 % (ref 39.0–52.0)
Hemoglobin: 15.2 g/dL (ref 13.0–17.0)
Immature Granulocytes: 0 %
Lymphocytes Relative: 21 %
Lymphs Abs: 0.8 10*3/uL (ref 0.7–4.0)
MCH: 32.9 pg (ref 26.0–34.0)
MCHC: 35.9 g/dL (ref 30.0–36.0)
MCV: 91.6 fL (ref 80.0–100.0)
Monocytes Absolute: 0.7 10*3/uL (ref 0.1–1.0)
Monocytes Relative: 17 %
Neutro Abs: 2.3 10*3/uL (ref 1.7–7.7)
Neutrophils Relative %: 58 %
Platelet Count: 194 10*3/uL (ref 150–400)
RBC: 4.62 MIL/uL (ref 4.22–5.81)
RDW: 13.7 % (ref 11.5–15.5)
WBC Count: 3.9 10*3/uL — ABNORMAL LOW (ref 4.0–10.5)
nRBC: 0.5 % — ABNORMAL HIGH (ref 0.0–0.2)

## 2023-05-18 LAB — MAGNESIUM: Magnesium: 2 mg/dL (ref 1.7–2.4)

## 2023-05-18 MED ORDER — PROCHLORPERAZINE MALEATE 10 MG PO TABS
10.0000 mg | ORAL_TABLET | Freq: Once | ORAL | Status: AC
  Administered 2023-05-18: 10 mg via ORAL

## 2023-05-18 MED ORDER — SODIUM CHLORIDE 0.9 % IV SOLN
400.0000 mg/m2 | Freq: Once | INTRAVENOUS | Status: AC
  Administered 2023-05-18: 752 mg via INTRAVENOUS
  Filled 2023-05-18: qty 37.6

## 2023-05-18 MED ORDER — SODIUM CHLORIDE 0.9 % IV SOLN
10.0000 mg | Freq: Once | INTRAVENOUS | Status: AC
  Administered 2023-05-18: 10 mg via INTRAVENOUS
  Filled 2023-05-18: qty 10

## 2023-05-18 MED ORDER — ATROPINE SULFATE 1 MG/ML IV SOLN
0.5000 mg | Freq: Once | INTRAVENOUS | Status: AC | PRN
  Administered 2023-05-18: 0.5 mg via INTRAVENOUS
  Filled 2023-05-18: qty 1

## 2023-05-18 MED ORDER — SODIUM CHLORIDE 0.9 % IV SOLN
180.0000 mg/m2 | Freq: Once | INTRAVENOUS | Status: AC
  Administered 2023-05-18: 340 mg via INTRAVENOUS
  Filled 2023-05-18: qty 15

## 2023-05-18 MED ORDER — SODIUM CHLORIDE 0.9 % IV SOLN
2400.0000 mg/m2 | INTRAVENOUS | Status: DC
  Administered 2023-05-18: 5000 mg via INTRAVENOUS
  Filled 2023-05-18: qty 100

## 2023-05-18 MED ORDER — FLUOROURACIL CHEMO INJECTION 2.5 GM/50ML
400.0000 mg/m2 | Freq: Once | INTRAVENOUS | Status: AC
  Administered 2023-05-18: 750 mg via INTRAVENOUS
  Filled 2023-05-18: qty 15

## 2023-05-18 MED ORDER — PALONOSETRON HCL INJECTION 0.25 MG/5ML
0.2500 mg | Freq: Once | INTRAVENOUS | Status: AC
  Administered 2023-05-18: 0.25 mg via INTRAVENOUS
  Filled 2023-05-18: qty 5

## 2023-05-18 MED ORDER — SODIUM CHLORIDE 0.9 % IV SOLN
Freq: Once | INTRAVENOUS | Status: AC
  Filled 2023-05-18: qty 250

## 2023-05-18 MED ORDER — SODIUM CHLORIDE 0.9 % IV SOLN
6.0000 mg/kg | Freq: Once | INTRAVENOUS | Status: AC
  Administered 2023-05-18: 440 mg via INTRAVENOUS
  Filled 2023-05-18: qty 2

## 2023-05-18 NOTE — Progress Notes (Signed)
Hematology/Oncology Progress note [phone] [phone]      CHIEF COMPLAINTS/REASON FOR VISIT:  Follow up for recurrent rectal cancer treatment  ASSESSMENT & PLAN:   Cancer Staging  Rectal cancer [facility]) Staging form: Colon and Rectum, AJCC 8th Edition - Clinical stage from 02/08/2021: Stage IIIB (cT4a, cN1, cM0) - Signed by [name], MD on 03/06/2021   Rectal cancer [facility]) locally advanced rectal cancer-Stage IIIB, s/p TNT neoadjuvant protocol, concurrent Xeloda and radiation-Finished 08/27/2021, s/p  APR,rectal adenocarcinoma, ypT3N0. Positive radial margin due to perforation. Biopsy proven recurrent rectal cancer, locally advanced disease, possible colovesical fistula; no distant metastatic disease on PET- per [name] Surgeon Dr. Luciano Cutter, recurrence is not resectable.  S/p concurrent Xeloda 825 mg/m2 twice daily  with radiation, last radiation 6/14 MRI pelvis w wo contrast showed persistent disease. Disease is not resectable per colorectal surgeon  Labs are reviewed and discussed with patient. Proceed with FOLFIRI + Panitumumab  Encounter for antineoplastic chemotherapy Chemotherapy next week.   Hypokalemia Continue potassium daily.   Rectal pain Controlled with oxycodone 5-10 mg Q6 hours PRN.  he declined.Fentanyl patch,    Lung nodule Previously discussed with IR, CT-guided biopsy is difficult due to the position of small size. Negative on PET evaluation.  Attention on follow-up CT scans.     Orders Placed This Encounter  Procedures   CBC with Differential (Cancer Center Only)    Standing Status:   Future    Standing Expiration Date:   06/14/2024   CMP (Cancer Center only)    Standing Status:   Future    Standing Expiration Date:   06/14/2024   Magnesium    Standing Status:   Future    Standing Expiration Date:   06/14/2024     Follow-up  2 weeks All questions were answered. The patient knows to call the clinic with any problems,  questions or concerns.  [name], MD, PhD [facility] Hematology Oncology 05/18/2023        HISTORY OF PRESENTING ILLNESS:   Barry Muro. is a  57 y.o.  male presents for rectal cancer Oncology History  Rectal cancer (HCC)  02/08/2021 Initial Diagnosis   Rectal cancer   02/05/2021-02/06/2021 patient was hospitalized due to generalized weakness, intermittent lightheadedness, weight loss and worsening constipation.  02/05/2021 CT abdomen showed concerning of severe rectal wall thickening and right internal iliac lymph node concerning for metastatic disease.  Patient was seen by gastroenterology and had colonoscopy which showed a circumferential fungating mass in the rectum.  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   02/14/2021-02/16/2021 hospitalized due to rectal bleeding and pain.   02/14/2021 CT showed perirectal fluid collection/gas concerning for infection with significant leukocytosis, anemia with hemoglobin of 6.8.  Patient received PRBC transfusion, IV antibiotics.  He underwent an IR guided placement of JP drain into the rectal abscess.  Discharged home with oral Augmentin. 02/20/2021 presented to ER with new skin opening and draining from left gluteus.  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  JP drain has been removed.  Patient was continued on Augmentin.   02/13/2021, PET scan showed locally advanced rectal cancer with billowing of the mesorectum now with low attenuation material that was not present on previous examination with extensive stranding and inflammation.   Bulky RIGHT pelvic sidewall/hypogastric lymph node outside of the mesorectum with stippled calcification measuring 19 mm-no increased metabolic activity. Small LEFT hypogastric lymph node just peripheral to the internal external bifurcation-SUV 3.2 High RIGHT internal just  below or at the internal/common iliac transition, lymph node -SUV 4.8 LEFT high hypogastric lymph node  8 mm-SUV  3. Scattered lymph nodes throughout the retroperitoneum with low FDG uptake Bilateral inguinal lymph nodes largest on the RIGHT (image 248/3) 11 mm with a maximum SUV of 2.8 spiculated nodule in the LEFT upper lobe- 9 x 8 mm       02/14/2021, CT abdomen pelvis without contrast Showed perforated rectal mass with contained perforation with fluid and gas extending above and below the pelvic floor,potentially involving the sphincter complex and extending into LEFT ischial rectal fossa   02/20/2021, CT pelvis with contrast showed Large perirectal/perianal abscess has markedly decreased in size since placement of the percutaneous drain. There are residual gas-filled collections in the soft tissues and suspect there is a fistula or sinus tract between the rectal mass and the subcutaneous tissues. Soft tissue gas along the medial left buttock and concern for a cutaneous ulceration in this area. Large rectal mass with evidence for a large necrotic right pelvic lymph node.   His case is complicated with a perirectal abscess. Prior to onset of perirectal abscess, on his 02/05/2021 scan, he was noted to have right internal iliac lymph node 3 x 1 x 2.3 cm which is concerning for nodal disease.On Subsequent images it was difficult to distinguish whether lymphadenopathy was due to nodal disease versus acute inflammation. 03/07/2021 MRI pelvis cT3 N2. Due to the possible contained perforation on previous CT, possible cT4 disease.    02/27/2021 medi port placed by Dr.Dew 03/13/2021 reports right butt cheek and also left perianal area fullness.he was seen by Dr.Pabon urgently  and had right buttock abscess drained. Left perianal fullness was felt to be due to cancer.    02/08/2021 Cancer Staging   Staging form: Colon and Rectum, AJCC 8th Edition - Clinical stage from 02/08/2021: Stage IIIB (cT4a, cN1, cM0) - Signed by [name], MD on 03/06/2021 Stage prefix: Initial diagnosis   02/27/2021 Procedure   medi port placed by  Dr.Dew   03/18/2021 - 07/05/2021 Chemotherapy    FOLFOX q14d x 4 months      07/17/2021 - 08/27/2021 Chemotherapy   Xeloda concurrent with Radiation   11/18/2021 Surgery   patient is status post open APR with flap for rectal cancer. Pathology rectal adenomacarcinoma ypT3N0.  Positive margin: Radial (circumferential) or mesenteric: adjacent to perforation      01/27/2022 - 02/02/2022 Hospital Admission   Hospitalized at [facility] due to recurrent abscess.  US guided drain into the pelvic abscess with return of 60 ml of purulent fluid. Cultures positive for staph aureus and strep agalactiae group b, fungal cultures negative.    02/25/2022 Imaging   CT chest angiogram with and without contrast, CT abdomen pelvis with and without contrast 1. Negative for acute pulmonary embolus.2. Emphysema. Further decrease in size of the previously noted irregular nodule in the left upper lobe which is now barely measurable today. No new suspicious lung nodules 3. Status post left lower quadrant colostomy. Further decrease in size since comparison exam from May of the rim enhancing gas and fluid collection at the pelvic surgical bed extending from the perineum superiorly into the pelvis 4. Slightly thickened appearance of terminal ileal small bowel loops in the pelvis with mild stranding suggesting small bowel inflammatory process. 5. Stable enlarged right pelvic sidewall lymph node   06/02/2022 Imaging   CT chest abdomen pelvis w contrast 1. Status post abdominal perineal resection with descending colostomy. 2. At the site  of pelvic fluid and gas collection on 02/25/2022, there is residual, decreased presacral soft tissue fullness, without drainable collection. 3. Similar right obturator nodal metastasis.4.  No acute process or evidence of metastatic disease in the chest. 5. Age advanced coronary artery atherosclerosis. Recommend assessment of coronary risk factors. 6. Aortic atherosclerosis and emphysema      06/26/2022 Imaging   CT abdomen pelvis with contrast showed 1. Interval increase in size of a lobulated partially imaged at least 7.8 x 3.8 x 12 cm abscess in the perianal region in a patient status post abdominal perineal resection and left lower lobe end colostomy formation. Finding extends to involve the pre sacral region up to the urinary dome level. Associated posterior urinary bladder wall thickening with lack of intraperitoneal fat plane between the presacral soft tissue thickening/abscess formation suggestive of possible fistulization and invasion of the posterior bladder wall. Underlying recurrent malignancy is not excluded. 2. Stable 2.7 cm right pelvic sidewall lymph node. 3.  Aortic Atherosclerosis   06/26/2022 - 06/27/2022 Hospital Admission   He went to [facility] ER and CT showed recurrent abscess. He was transferred to [facility] due to recurrent abscess, treated with IV vanc/zosyn, IR was consulted and drainage tube was placed. Wound grew up Strep viridans. Patient was seen by ID at [facility] discharged with Augmentin for 2 weeks.   He was seen by wound care Dr> Kandace Blitz on 07/09/2022, JP drain was removed.    09/29/2022 Imaging   MRI pelvis without contrast showed  Interval abdominoperitoneal resection since prior MRI. Decreased small fluid collection in the surgical bed compared with more recent CT, consistent with resolving postoperative fluid collection or abscess.   Bulky rounded presacral mass, which shows diffuse restricted diffusion, without significant change in size since most recent CT of 06/26/2022. This raises suspicion for recurrent carcinoma over post treatment changes. Recommend correlation with CEA level, and consider tissue sampling or PET-CT.   Stable enlarged right pelvic sidewall lymph node. No new or increased adenopathy identified.   10/09/2022 Imaging   CT chest with contrast showed 1. Unchanged 0.4 cm fissural nodule of the anterior left lower lobe. This is almost  certainly a benign fissural lymph node. No new or suspicious pulmonary nodules. 2. Moderate emphysema and diffuse bilateral bronchial wall thickening. 3. Coronary artery disease.   10/13/2022 Imaging   CT abdomen pelvis with contrast showed 1. Complex collection persists in the lower pelvis, extending from the anal verge upwards into the presacral space and anteriorly from the presacral space to the bladder dome, not significantly changed in size or extent compared to the earlier CT of 06/26/2022. This is most likely a combination of a postoperative seroma and/or chronic phlegmon/abscess versus recurrent abscess. 2. Bladder walls are thick walled/edematous. Given the contiguity of the presacral collection and the bladder dome, this is highly suspicious for a related bladder wall infection and possibly secondary to colovesical fistula. Recommend correlation with urinalysis. 3. No evidence of bowel obstruction. LEFT lower abdominal wall colostomy, without obstruction or inflammatory change. 4. No free intraperitoneal air   10/13/2022 [facility] Admission   Hospitalized due to fever and rectal pain. Urine culture showed mixed urogenital flora. IR CT guided Aspiration of abscess showed Streptococcal Viridans. He was placed back on Augmentin  10/14/2023 CT read by [facility] radiology Postsurgical changes of abdominoperineal resection. Interval enlargement of  fluid collection in the surgical bed. Multiple foci of enhancing tissue at  the margins of this fluid collection, increased from prior study, suspicious for  local recurrence.   Prominent retroperitoneal lymph nodes, some which are increased in size from prior study. These are indeterminate and may be either reactive or represent metastatic disease. Recommend continued attention on follow-up.   Centrally hypoattenuating right obturator lymph node, unchanged in size, consistent with treated disease.   Circumferential bladder wall thickening and irregularity,  most pronounced on the left side. This may be reactive in the setting of adjacent pelvic inflammatory changes. Correlate with urinalysis if there is clinical  concern for urinary tract infection.    11/14/2022 Imaging   MRI pelvis w wo contrast  1. Status post abdominoperineal resection with left lower quadrant end colostomy. 2. Compared to prior CT and MR, no significant change in appearance of heterogeneous, rim enhancing presacral and low pelvic soft tissue and fluid. Largest heterogeneously enhancing conglomerate of fluid and soft tissue appears to closely involve the anteriorly abutting the seminal vesicles and measures 3.0 x 2.9 cm in largest axial dimension. This extends to superiorly contact the posterior bladder dome and inferiorly towards the gluteal cleft. 3. Discrete fluid component at the most inferior extent measuring 2.4 x 1.3 cm. This is markedly diminished in volume compared to more remote previous examinations, for example 06/26/2022. 4. Constellation of findings is highly concerning for locally recurrent rectal malignancy with or without superimposed infection. Presence or absence of infection at this time is not established by MR. 5. Additional unchanged hemorrhagic or proteinaceous fluid collection in the right hemipelvis measuring 3.2 x 2.6 cm most consistent with postoperative hematoma or seroma. 6. Unchanged thickening of the urinary bladder wall. As previously reported, fistula or involvement of bladder wall by malignancy not excluded.   12/09/2022 Procedure   CT guided biopsy of the pelvic mass showed Moderately differentiated adenocarcinoma with dirty necrosis, morphologically identical to patient's prior rectal adenocarcinoma.   [facility] NGS xT 648 panel showed MLH3 start loss,  BCORL1 frame shift, TP53 splice region varian, APC stop gain,  TMB 7.9, MS stable, KRAS/BRAF/NRAS negative.   [facility] NGS xR showed no gene arrangement or reportable altered splicing events in RNA  sequencing      01/07/2023 -  Chemotherapy   Xeloda 825 mg/m2 twice daily  with radiation   02/11/2023 Imaging   CT abdomen pelvis w contrast  1. Examination is positive for small bowel obstruction. Transition point is identified within the left iliac fossa. Distal to the transition point the small bowel loops exhibit mucosal enhancement and wall thickening concerning for enteritis. 2. There is new right-sided hydronephrosis and hydroureter up to the level of the bifurcation of right common iliac artery. The distal right ureter appears closely associated with the previously characterized tracer avid presacral soft tissue mass. Cannot exclude obstructive uropathy secondary to tumor involvement. 3. Similar appearance of presacral soft tissue mass. This was tracer avid on the recent PET-CT from 12/17/2022, and concerning for locally recurrent tumor. 4. Unchanged diffuse circumferential wall thickening involving the urinary bladder. 5. Aortic Atherosclerosis    04/17/2023 -  Chemotherapy   Patient is on Treatment Plan : COLORECTAL FOLFIRI + Panitumumab q14d       Patient presented to emergency room on 12/08/21, rectal pain.  CT scan showed 18.3 cm fluid and gas collection in the pelvic surgical bed compatible with infected collection/abscess.  Patient was sent to [facility] and admitted for.  Patient has CT-guided drain placement on 12/09/2021.  Blood culture positive for Staph epidermidis which was felt to be contaminant.  Fluid culture grew pansensitive Staph aureus, strep pyogenes  and candida albicans.  Patient was treated with IV antibiotics and transition to p.o. Augmentin and fluconazole on 12/11/2021.  #Perirectal abscess status post drainage catheter , patient was recently seen by [facility] oncology surgeon on 12/24/2021 and was recommended to keep drainage catheter and continue antibiotics. Patient had a repeat CT scan done which showed a smaller but persistent abscess.  He will return to [facility] surgery for  follow-up of drain removal. His case was discussed on [name] tumor board for the positive mesenteric margin which was felt to be secondary to previous perforation.  Recommend surveillance.  02/11/23 patient was admitted due to nausea, CT showed small bowel obstruction, severe constipation, NG tube was placed. His symptoms improved and has ostomy output, NG was removed and he tolerated PO.   During the interval ER visit due to abdominal pain.  03/02/23 CT abdomen pelvis w contrast showed . Findings are again suggestive of probable partial small bowel obstruction which appears likely related to enteritis. Specifically, there are multiple areas of mural thickening throughout the distal small bowel with increased mucosal and mural enhancement, with some very mild proximal small bowel dilatation. 2. Status post APR with left lower quadrant colostomy and large soft tissue mass in the low anatomic pelvis which is slightly more prominent than the recent prior study, and was previously hypermetabolic on prior PET-CT 12/17/2022, suggesting residual disease. 3. Probable nodal mass along the right pelvic sidewall in the right internal iliac nodal distribution, similar to the prior study, previously not hypermetabolic on prior PET-CT. No other lymphadenopathy or definitive signs of metastatic disease noted elsewhere on today's examination.  4. Aortic atherosclerosis, as well as calcified atherosclerotic plaque in the right coronary artery. Please note that although the presence of coronary artery calcium documents the presence of coronary artery disease, the severity of this disease and any potential stenosis cannot be assessed on this non-gated CT examination. Assessment for potential risk factor modification, dietary therapy or pharmacologic therapy may be warranted, if clinically indicated. 5. Additional incidental findings, as above. Patient has radiation induced enteritis. His symptoms were mild he was discharged  home.   INTERVAL HISTORY Barry Space. is a 57 y.o. male who has above history reviewed by me today presents for follow up visit for management of recurrent locally advanced rectal cancer.  He feels  good today, Rectal pain is controlled, good appetite.  Denies fever chills, rectal pain. Denies rectal discharge.  Denies nausea vomiting or abdominal pain.      Review of Systems  Constitutional:  Negative for appetite change, chills, fatigue, fever and unexpected weight change.  HENT:   Negative for hearing loss and voice change.   Eyes:  Negative for eye problems and icterus.  Respiratory:  Negative for chest tightness, cough and shortness of breath.   Cardiovascular:  Negative for chest pain and leg swelling.  Gastrointestinal:  Negative for abdominal distention, abdominal pain, blood in stool, constipation, diarrhea and rectal pain.       Rectal pain  Endocrine: Negative for hot flashes.  Genitourinary:  Negative for difficulty urinating, dysuria and frequency.   Musculoskeletal:  Negative for arthralgias.  Skin:  Negative for itching and rash.  Neurological:  Negative for light-headedness and numbness.  Hematological:  Negative for adenopathy. Does not bruise/bleed easily.  Psychiatric/Behavioral:  Negative for confusion.     MEDICAL HISTORY:  Past Medical History:  Diagnosis Date   Headache    Left shoulder pain    Rectal cancer (HCC)  SURGICAL HISTORY: Past Surgical History:  Procedure Laterality Date   COLON SURGERY     COLONOSCOPY WITH PROPOFOL N/A 02/06/2021   Procedure: COLONOSCOPY WITH PROPOFOL;  Surgeon: Toney Reil, MD;  Location: [facility] ENDOSCOPY;  Service: Gastroenterology;  Laterality: N/A;   PORTA CATH INSERTION N/A 02/27/2021   Procedure: PORTA CATH INSERTION;  Surgeon: Annice Needy, MD;  Location: [facility] INVASIVE CV LAB;  Service: Cardiovascular;  Laterality: N/A;   ROTATOR CUFF REPAIR Left     SOCIAL HISTORY: Social History    Socioeconomic History   Marital status: Married    Spouse name: Not on file   Number of children: Not on file   Years of education: Not on file   Highest education level: Not on file  Occupational History   Not on file  Tobacco Use   Smoking status: Former    Current packs/day: 0.00    Average packs/day: 1 pack/day for 10.0 years (10.0 ttl pk-yrs)    Types: Cigars, Cigarettes    Start date: 02/05/2011    Quit date: 02/04/2021    Years since quitting: 2.2   Smokeless tobacco: Never  Substance and Sexual Activity   Alcohol use: Not Currently   Drug use: No   Sexual activity: Not on file  Other Topics Concern   Not on file  Social History Narrative   Not on file   Social Determinants of Health   Financial Resource Strain: High Risk (02/13/2023)   Received from [facility], [facility]   Overall Financial Resource Strain (CARDIA)    Difficulty of Paying Living Expenses: Hard  Food Insecurity: No Food Insecurity (02/13/2023)   Received from [facility], [facility]   Hunger Vital Sign    Worried About Running Out of Food in the Last Year: Never true    Ran Out of Food in the Last Year: Never true  Transportation Needs: No Transportation Needs (02/13/2023)   Received from [facility], [facility]   [facility] - Transportation    In the past 12 months, has lack of transportation kept you from medical appointments or from getting medications?: No    Lack of Transportation (Non-Medical): No  Physical Activity: Inactive (11/03/2022)   Exercise Vital Sign    Days of Exercise per Week: 0 days    Minutes of Exercise per Session: 0 min  Stress: Stress Concern Present (11/03/2022)   [employer] of Occupational Health - Occupational Stress Questionnaire    Feeling of Stress : Rather much  Social Connections: Moderately Isolated (11/03/2022)   Social Connection and Isolation Panel  [NHANES]    Frequency of Communication with Friends and Family: Twice a week    Frequency of Social Gatherings with Friends and Family: Three times a week    Attends Religious Services: 1 to 4 times per year    Active Member of Clubs or Organizations: No    Attends Banker Meetings: Never    Marital Status: Separated  Intimate Partner Violence: Not At Risk (11/03/2022)   Humiliation, Afraid, Rape, and Kick questionnaire    Fear of Current or Ex-Partner: No    Emotionally Abused: No    Physically Abused: No    Sexually Abused: No    FAMILY HISTORY: Family History  Problem Relation Age of Onset   Cancer Sister    Diabetes Mother    Cancer Maternal Grandmother    Cancer Paternal Grandmother  ALLERGIES:  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  MEDICATIONS:  Current Outpatient Medications  Medication Sig Dispense Refill   calcium-vitamin D (OSCAL WITH D) 500-5 MG-MCG tablet Take 2 tablets by mouth daily. 60 tablet 3   citalopram (CELEXA) 10 MG tablet Take 1 tablet (10 mg total) by mouth daily. 30 tablet 3   lidocaine-prilocaine (EMLA) cream Apply 1 Application topically as needed. Apply small amount to port and cover with saran wrap 1-2 hours prior to port access 30 g 2   loperamide (IMODIUM) 2 MG capsule Take 2 capsules (4mg ) by mouth initially. Then take 1 capsule by mouth every 2 hours (4mg  every 4 hours at night). Take no more than 16mg /day 90 capsule 2   nicotine (NICODERM CQ - DOSED IN MG/24 HR) 7 mg/24hr patch Place 1 patch (7 mg total) onto the skin daily. 28 patch 2   oxyCODONE (ROXICODONE) 5 MG immediate release tablet Take 1-2 tablets (5-10 mg total) by mouth every 6 (six) hours as needed for severe pain. 90 tablet 0   potassium chloride (KLOR-CON M) 10 MEQ tablet Take 1 tablet (10 mEq total) by mouth 2 (two) times daily. 30 tablet 0   ondansetron (ZOFRAN) 8 MG tablet Take 1 tablet (8 mg total) by mouth every 8 (eight) hours as needed for nausea or vomiting.  (Patient not taking: Reported on 05/18/2023) 90 tablet 1   prochlorperazine (COMPAZINE) 10 MG tablet Take 1 tablet (10 mg total) by mouth every 6 (six) hours as needed for nausea or vomiting. (Patient not taking: Reported on 05/18/2023) 90 tablet 1   senna (SENOKOT) 8.6 MG TABS tablet Take 2 tablets (17.2 mg total) by mouth daily. (Patient not taking: Reported on 04/28/2023) 100 tablet 0   traZODone (DESYREL) 50 MG tablet Take 1 tablet (50 mg total) by mouth at bedtime as needed for sleep. (Patient not taking: Reported on 04/28/2023) 30 tablet 3   No current facility-administered medications for this visit.   Facility-Administered Medications Ordered in Other Visits  Medication Dose Route Frequency Provider Last Rate Last Admin   fluorouracil (ADRUCIL) 5,000 mg in sodium chloride 0.9 % 150 mL chemo infusion  2,400 mg/m2 (Treatment Plan Recorded) Intravenous 1 day or 1 dose [name], MD       fluorouracil (ADRUCIL) chemo injection 750 mg  400 mg/m2 (Treatment Plan Recorded) Intravenous Once [name], MD       heparin lock flush 100 unit/mL  500 Units Intravenous Once [name], MD       irinotecan (CAMPTOSAR) 340 mg in sodium chloride 0.9 % 500 mL chemo infusion  180 mg/m2 (Treatment Plan Recorded) Intravenous Once [name], MD       leucovorin 752 mg in sodium chloride 0.9 % 250 mL infusion  400 mg/m2 (Treatment Plan Recorded) Intravenous Once [name], MD       sodium chloride flush (NS) 0.9 % injection 10 mL  10 mL Intracatheter PRN [name], MD   10 mL at 05/06/23 1246     PHYSICAL EXAMINATION: ECOG PERFORMANCE STATUS: 1 - Symptomatic but completely ambulatory Vitals:   05/18/23 0907  BP: (!) 136/103  Pulse: 87  Resp: 18  Temp: 97.6 F (36.4 C)     Filed Weights   05/18/23 0907  Weight: 164 lb 11.2 oz (74.7 kg)      Physical Exam HENT:     Head: Normocephalic and atraumatic.  Eyes:     General: No scleral icterus. Cardiovascular:     Rate  and Rhythm: Normal rate and regular  rhythm.     Heart sounds: Normal heart sounds.  Pulmonary:     Effort: Pulmonary effort is normal. No respiratory distress.     Breath sounds: No wheezing.     Comments: Decreased breath sound bilaterally.  Abdominal:     General: Bowel sounds are normal. There is no distension.     Palpations: Abdomen is soft.     Comments: Colostomy bag  Genitourinary:    Comments: Status post APR, Musculoskeletal:        General: No deformity. Normal range of motion.     Cervical back: Normal range of motion and neck supple.  Skin:    General: Skin is warm and dry.     Findings: No erythema or rash.  Neurological:     Mental Status: He is alert and oriented to person, place, and time. Mental status is at baseline.     Cranial Nerves: No cranial nerve deficit.     Coordination: Coordination normal.  Psychiatric:        Mood and Affect: Mood normal.     LABORATORY DATA:  I have reviewed the data as listed    Latest Ref Rng & Units 05/18/2023    8:50 AM 05/04/2023    8:15 AM 04/28/2023    8:53 AM  CBC  WBC 4.0 - 10.5 K/uL 3.9  3.1  2.8   Hemoglobin 13.0 - 17.0 g/dL 46.9  62.9  52.8   Hematocrit 39.0 - 52.0 % 42.3  41.8  42.2   Platelets 150 - 400 K/uL 194  191  211       Latest Ref Rng & Units 05/18/2023    8:50 AM 05/04/2023    8:15 AM 04/28/2023    8:53 AM  CMP  Glucose 70 - 99 mg/dL 413  244  010   BUN 6 - 20 mg/dL 12  15  8    Creatinine 0.61 - 1.24 mg/dL 2.72  5.36  6.44   Sodium 135 - 145 mmol/L 136  136  134   Potassium 3.5 - 5.1 mmol/L 3.6  3.3  3.5   Chloride 98 - 111 mmol/L 104  106  104   CO2 22 - 32 mmol/L 25  26  24    Calcium 8.9 - 10.3 mg/dL 8.6  8.3  8.9   Total Protein 6.5 - 8.1 g/dL 7.8  7.2  7.6   Total Bilirubin 0.3 - 1.2 mg/dL 0.6  0.4  0.2   Alkaline Phos 38 - 126 U/L 87  82  80   AST 15 - 41 U/L 20  16  20    ALT 0 - 44 U/L 20  18  20       RADIOGRAPHIC STUDIES: I have personally reviewed the radiological images as listed and agreed with the findings in the  report. MR PELVIS W WO CONTRAST  Result Date: 04/07/2023 CLINICAL DATA:  Recurrent rectal carcinoma. Presacral mass. Prior APR EXAM: MRI PELVIS WITHOUT AND WITH CONTRAST TECHNIQUE: Multiplanar multisequence MR imaging of the pelvis was performed both before and after administration of intravenous contrast. CONTRAST:  7mL GADAVIST GADOBUTROL 1 MMOL/ML IV SOLN COMPARISON:  CT on 03/02/2023 and MRI on 11/14/2022 FINDINGS: Lower Urinary Tract: Diffuse bladder wall thickening is stable, and may be due to previous radiation therapy or cystitis. Bowel: Postop changes from previous APR. Poorly defined and rounded masslike lesion is seen in the region of the Hartmann's pouch which shows peripheral nodular enhancement.  This measures 5.0 x 3.8 cm on image 51/22, without significant change compared to most recent CT, but increased from earlier MRI when it measured approximately 3.0 x 2.9 cm. This is highly suspicious for recurrent rectal carcinoma. Vascular/Lymphatic: Right internal iliac lymphadenopathy measures 3.1 x 2.4 cm on image 46/14, stable compared to prior CT and earlier MRI. No other pathologically enlarged lymph nodes identified. Reproductive:  No mass or other significant abnormality. Other: None. Musculoskeletal: No suspicious bone lesions identified. IMPRESSION: 5 cm masslike lesion in the region of the Hartmann's pouch, increased in size from earlier MRI several months ago, and highly suspicious for recurrent rectal carcinoma. Stable right internal iliac lymphadenopathy, consistent with metastatic disease. Stable diffuse bladder wall thickening, which may be due to previous radiation therapy or other etiology of cystitis. Electronically Signed   By: Danae Orleans M.D.   On: 04/07/2023 16:35   CT ABDOMEN PELVIS W CONTRAST  Result Date: 03/02/2023 CLINICAL DATA:  57 year old male with history of acute onset of nonlocalized abdominal pain. History of rectal cancer. * Tracking Code: BO * EXAM: CT ABDOMEN AND  PELVIS WITH CONTRAST TECHNIQUE: Multidetector CT imaging of the abdomen and pelvis was performed using the standard protocol following bolus administration of intravenous contrast. RADIATION DOSE REDUCTION: This exam was performed according to the departmental dose-optimization program which includes automated exposure control, adjustment of the mA and/or kV according to patient size and/or use of iterative reconstruction technique. CONTRAST:  75mL OMNIPAQUE IOHEXOL 350 MG/ML SOLN COMPARISON:  CT of the abdomen and pelvis 02/11/2023. FINDINGS: Lower chest: Atherosclerotic calcifications in the right coronary artery. Hepatobiliary: 1.3 cm partially exophytic low-attenuation lesion associated with segment 6 of the liver (axial image 29 of series 3), similar to the prior study, compatible with a small cyst. No other suspicious appearing hepatic lesions are noted. No intra or extrahepatic biliary ductal dilatation. Gallbladder is unremarkable in appearance. Pancreas: No pancreatic mass. No pancreatic ductal dilatation. No pancreatic or peripancreatic fluid collections or inflammatory changes. Spleen: Unremarkable. Adrenals/Urinary Tract: Subcentimeter low-attenuation lesions in both kidneys, too small to definitively characterize, but statistically likely to represent tiny cysts (no imaging follow-up recommended). No other suspicious appearing renal lesions are noted. No hydroureteronephrosis. Bilateral adrenal glands are normal in appearance. Urinary bladder wall appears severely thickened, most evident posteriorly. Mild haziness in the perivesical fat. Stomach/Bowel: The appearance of the stomach is unremarkable. There continues to be extensive mural thickening and increased mucosal and mural enhancement throughout portions of the distal small bowel. Proximal loops of small bowel are mildly dilated measuring up to 3.1 cm in diameter. The degree of small-bowel dilatation appears slightly increased compared to the prior  study. Some gas and stool is noted in the colon. 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 There is a poorly defined soft tissue mass (previously hypermetabolic on prior PET-CT 12/17/2022) in the low anatomic pelvis which appears intimately associated with the seminal vesicles, and makes contact with the posterior aspect of the urinary bladder wall, similar to the prior study best appreciated on axial image 77 of series 3 and sagittal image 99 of series 7, currently estimated to measure approximately 5.3 x 4.9 x 7.6 cm. Vascular/Lymphatic: Atherosclerosis in the abdominal aorta and pelvic vasculature, without evidence of aneurysm or dissection. Heterogeneously enhancing nodal mass along the right pelvic sidewall measuring 2.8 x 3.2 cm (axial image 74 of series 3), similar to the recent prior study (and not previously hypermetabolic on prior PET-CT). No other definite lymphadenopathy confidently  identified elsewhere in the abdomen or pelvis. Reproductive: Prostate gland and seminal vesicles are distorted by postoperative and postradiation changes in the pelvis, but the previously described pelvic mass is intimately associated with the seminal vesicles. Other: No significant volume of ascites.  No pneumoperitoneum. Musculoskeletal: There are no aggressive appearing lytic or blastic lesions noted in the visualized portions of the skeleton. IMPRESSION: 1. Findings are again suggestive of probable partial small bowel obstruction which appears likely related to enteritis. Specifically, there are multiple areas of mural thickening throughout the distal small bowel with increased mucosal and mural enhancement, with some very mild proximal small bowel dilatation. 2. Status post APR with left lower quadrant colostomy and large soft tissue mass in the low anatomic pelvis which is slightly more prominent than the recent prior study, and was previously hypermetabolic on prior PET-CT 12/17/2022,  suggesting residual disease. 3. Probable nodal mass along the right pelvic sidewall in the right internal iliac nodal distribution, similar to the prior study, previously not hypermetabolic on prior PET-CT. No other lymphadenopathy or definitive signs of metastatic disease noted elsewhere on today's examination. 4. Aortic atherosclerosis, as well as calcified atherosclerotic plaque in the right coronary artery. Please note that although the presence of coronary artery calcium documents the presence of coronary artery disease, the severity of this disease and any potential stenosis cannot be assessed on this non-gated CT examination. Assessment for potential risk factor modification, dietary therapy or pharmacologic therapy may be warranted, if clinically indicated. 5. Additional incidental findings, as above. Electronically Signed   By: Trudie Reed M.D.   On: 03/02/2023 07:01

## 2023-05-18 NOTE — Assessment & Plan Note (Signed)
Previously discussed with IR, CT-guided biopsy is difficult due to the position of small size. Negative on PET evaluation.  Attention on follow-up CT scans.

## 2023-05-18 NOTE — Assessment & Plan Note (Signed)
 Continue potassium daily.

## 2023-05-18 NOTE — Assessment & Plan Note (Signed)
Controlled with oxycodone 5-10 mg Q6 hours PRN.  he declined.Fentanyl patch,

## 2023-05-18 NOTE — Patient Instructions (Signed)

## 2023-05-18 NOTE — Assessment & Plan Note (Signed)
locally advanced rectal cancer-Stage IIIB, s/p TNT neoadjuvant protocol, concurrent Xeloda and radiation-Finished 08/27/2021, s/p  APR,rectal adenocarcinoma, [id number]. Positive radial margin due to perforation. Biopsy proven recurrent rectal cancer, locally advanced disease, possible colovesical fistula; no distant metastatic disease on PET- per [name] Surgeon Dr. Luciano Cutter, recurrence is not resectable.  S/p concurrent Xeloda 825 mg/m2 twice daily  with radiation, last radiation 6/14 MRI pelvis w wo contrast showed persistent disease. Disease is not resectable per colorectal surgeon  Labs are reviewed and discussed with patient. Proceed with FOLFIRI + Panitumumab

## 2023-05-18 NOTE — Assessment & Plan Note (Signed)
Chemotherapy next week.

## 2023-05-20 ENCOUNTER — Inpatient Hospital Stay: Payer: 59

## 2023-05-20 DIAGNOSIS — C20 Malignant neoplasm of rectum: Secondary | ICD-10-CM

## 2023-05-20 DIAGNOSIS — Z923 Personal history of irradiation: Secondary | ICD-10-CM | POA: Diagnosis not present

## 2023-05-20 DIAGNOSIS — Z79891 Long term (current) use of opiate analgesic: Secondary | ICD-10-CM | POA: Diagnosis not present

## 2023-05-20 DIAGNOSIS — Z5112 Encounter for antineoplastic immunotherapy: Secondary | ICD-10-CM | POA: Diagnosis not present

## 2023-05-20 DIAGNOSIS — E876 Hypokalemia: Secondary | ICD-10-CM | POA: Diagnosis not present

## 2023-05-20 DIAGNOSIS — Z5111 Encounter for antineoplastic chemotherapy: Secondary | ICD-10-CM | POA: Diagnosis not present

## 2023-05-20 DIAGNOSIS — G893 Neoplasm related pain (acute) (chronic): Secondary | ICD-10-CM | POA: Diagnosis not present

## 2023-05-20 DIAGNOSIS — R911 Solitary pulmonary nodule: Secondary | ICD-10-CM | POA: Diagnosis not present

## 2023-05-20 MED ORDER — HEPARIN SOD (PORK) LOCK FLUSH 100 UNIT/ML IV SOLN
500.0000 [IU] | Freq: Once | INTRAVENOUS | Status: AC | PRN
  Administered 2023-05-20: 500 [IU]
  Filled 2023-05-20: qty 5

## 2023-05-20 MED ORDER — SODIUM CHLORIDE 0.9% FLUSH
10.0000 mL | INTRAVENOUS | Status: DC | PRN
  Administered 2023-05-20: 10 mL
  Filled 2023-05-20: qty 10

## 2023-05-21 DIAGNOSIS — C20 Malignant neoplasm of rectum: Secondary | ICD-10-CM | POA: Diagnosis not present

## 2023-05-29 MED FILL — Dexamethasone Sodium Phosphate Inj 100 MG/10ML: INTRAMUSCULAR | Qty: 1 | Status: AC

## 2023-06-01 ENCOUNTER — Inpatient Hospital Stay (HOSPITAL_BASED_OUTPATIENT_CLINIC_OR_DEPARTMENT_OTHER): Payer: 59 | Admitting: Oncology

## 2023-06-01 ENCOUNTER — Encounter: Payer: Self-pay | Admitting: Oncology

## 2023-06-01 ENCOUNTER — Inpatient Hospital Stay: Payer: 59

## 2023-06-01 VITALS — BP 122/94 | HR 93 | Temp 97.6°F | Resp 18 | Wt 166.8 lb

## 2023-06-01 DIAGNOSIS — Z5112 Encounter for antineoplastic immunotherapy: Secondary | ICD-10-CM | POA: Diagnosis not present

## 2023-06-01 DIAGNOSIS — Z79891 Long term (current) use of opiate analgesic: Secondary | ICD-10-CM | POA: Diagnosis not present

## 2023-06-01 DIAGNOSIS — C20 Malignant neoplasm of rectum: Secondary | ICD-10-CM | POA: Diagnosis not present

## 2023-06-01 DIAGNOSIS — E876 Hypokalemia: Secondary | ICD-10-CM | POA: Diagnosis not present

## 2023-06-01 DIAGNOSIS — R911 Solitary pulmonary nodule: Secondary | ICD-10-CM

## 2023-06-01 DIAGNOSIS — Z5111 Encounter for antineoplastic chemotherapy: Secondary | ICD-10-CM | POA: Diagnosis not present

## 2023-06-01 DIAGNOSIS — K6289 Other specified diseases of anus and rectum: Secondary | ICD-10-CM

## 2023-06-01 DIAGNOSIS — G893 Neoplasm related pain (acute) (chronic): Secondary | ICD-10-CM | POA: Diagnosis not present

## 2023-06-01 DIAGNOSIS — Z923 Personal history of irradiation: Secondary | ICD-10-CM | POA: Diagnosis not present

## 2023-06-01 LAB — CMP (CANCER CENTER ONLY)
ALT: 16 U/L (ref 0–44)
AST: 22 U/L (ref 15–41)
Albumin: 3.6 g/dL (ref 3.5–5.0)
Alkaline Phosphatase: 85 U/L (ref 38–126)
Anion gap: 8 (ref 5–15)
BUN: 7 mg/dL (ref 6–20)
CO2: 25 mmol/L (ref 22–32)
Calcium: 8.6 mg/dL — ABNORMAL LOW (ref 8.9–10.3)
Chloride: 102 mmol/L (ref 98–111)
Creatinine: 0.88 mg/dL (ref 0.61–1.24)
GFR, Estimated: >60 mL/min (ref >60)
Glucose, Bld: 137 mg/dL — ABNORMAL HIGH (ref 70–99)
Potassium: 3.4 mmol/L — ABNORMAL LOW (ref 3.5–5.1)
Sodium: 135 mmol/L (ref 135–145)
Total Bilirubin: 0.4 mg/dL (ref 0.3–1.2)
Total Protein: 7.3 g/dL (ref 6.5–8.1)

## 2023-06-01 LAB — CBC WITH DIFFERENTIAL (CANCER CENTER ONLY)
Abs Immature Granulocytes: 0.02 10*3/uL (ref 0.00–0.07)
Basophils Absolute: 0 10*3/uL (ref 0.0–0.1)
Basophils Relative: 1 %
Eosinophils Absolute: 0.1 10*3/uL (ref 0.0–0.5)
Eosinophils Relative: 3 %
HCT: 41 % (ref 39.0–52.0)
Hemoglobin: 14.7 g/dL (ref 13.0–17.0)
Immature Granulocytes: 1 %
Lymphocytes Relative: 24 %
Lymphs Abs: 0.8 10*3/uL (ref 0.7–4.0)
MCH: 32.7 pg (ref 26.0–34.0)
MCHC: 35.9 g/dL (ref 30.0–36.0)
MCV: 91.3 fL (ref 80.0–100.0)
Monocytes Absolute: 0.6 10*3/uL (ref 0.1–1.0)
Monocytes Relative: 18 %
Neutro Abs: 1.8 10*3/uL (ref 1.7–7.7)
Neutrophils Relative %: 53 %
Platelet Count: 192 10*3/uL (ref 150–400)
RBC: 4.49 MIL/uL (ref 4.22–5.81)
RDW: 13.8 % (ref 11.5–15.5)
WBC Count: 3.3 10*3/uL — ABNORMAL LOW (ref 4.0–10.5)
nRBC: 0.6 % — ABNORMAL HIGH (ref 0.0–0.2)

## 2023-06-01 LAB — MAGNESIUM: Magnesium: 2 mg/dL (ref 1.7–2.4)

## 2023-06-01 MED ORDER — SODIUM CHLORIDE 0.9 % IV SOLN
10.0000 mg | Freq: Once | INTRAVENOUS | Status: AC
  Administered 2023-06-01: 10 mg via INTRAVENOUS
  Filled 2023-06-01: qty 10

## 2023-06-01 MED ORDER — SODIUM CHLORIDE 0.9 % IV SOLN
400.0000 mg/m2 | Freq: Once | INTRAVENOUS | Status: AC
  Administered 2023-06-01: 760 mg via INTRAVENOUS
  Filled 2023-06-01: qty 38

## 2023-06-01 MED ORDER — SODIUM CHLORIDE 0.9 % IV SOLN
2400.0000 mg/m2 | INTRAVENOUS | Status: DC
  Administered 2023-06-01: 5000 mg via INTRAVENOUS
  Filled 2023-06-01: qty 100

## 2023-06-01 MED ORDER — SODIUM CHLORIDE 0.9 % IV SOLN
6.0000 mg/kg | Freq: Once | INTRAVENOUS | Status: AC
  Administered 2023-06-01: 500 mg via INTRAVENOUS
  Filled 2023-06-01: qty 20

## 2023-06-01 MED ORDER — SODIUM CHLORIDE 0.9 % IV SOLN
Freq: Once | INTRAVENOUS | Status: AC
  Filled 2023-06-01: qty 250

## 2023-06-01 MED ORDER — SODIUM CHLORIDE 0.9 % IV SOLN
180.0000 mg/m2 | Freq: Once | INTRAVENOUS | Status: AC
  Administered 2023-06-01: 340 mg via INTRAVENOUS
  Filled 2023-06-01: qty 15

## 2023-06-01 MED ORDER — SODIUM CHLORIDE 0.9 % IV SOLN
400.0000 mg/m2 | Freq: Once | INTRAVENOUS | Status: DC
  Filled 2023-06-01: qty 38

## 2023-06-01 MED ORDER — PALONOSETRON HCL INJECTION 0.25 MG/5ML
0.2500 mg | Freq: Once | INTRAVENOUS | Status: AC
  Administered 2023-06-01: 0.25 mg via INTRAVENOUS
  Filled 2023-06-01: qty 5

## 2023-06-01 MED ORDER — ATROPINE SULFATE 1 MG/ML IV SOLN
0.5000 mg | Freq: Once | INTRAVENOUS | Status: AC | PRN
  Administered 2023-06-01: 0.5 mg via INTRAVENOUS
  Filled 2023-06-01: qty 1

## 2023-06-01 MED ORDER — FLUOROURACIL CHEMO INJECTION 2.5 GM/50ML
400.0000 mg/m2 | Freq: Once | INTRAVENOUS | Status: AC
  Administered 2023-06-01: 750 mg via INTRAVENOUS
  Filled 2023-06-01: qty 15

## 2023-06-01 NOTE — Assessment & Plan Note (Signed)
locally advanced rectal cancer-Stage IIIB, s/p TNT neoadjuvant protocol, concurrent Xeloda and radiation-Finished 08/27/2021, s/p  APR,rectal adenocarcinoma, [id number]. Positive radial margin due to perforation. Biopsy proven recurrent rectal cancer, locally advanced disease, possible colovesical fistula; no distant metastatic disease on PET- per [name] Surgeon Dr. Luciano Cutter, recurrence is not resectable.  S/p concurrent Xeloda 825 mg/m2 twice daily  with radiation, last radiation 6/14 MRI pelvis w wo contrast showed persistent disease. Disease is not resectable per colorectal surgeon  Labs are reviewed and discussed with patient. Proceed with FOLFIRI + Panitumumab

## 2023-06-01 NOTE — Progress Notes (Signed)
Hematology/Oncology Progress note Telephone:([phone]) [phone] [phone]) [phone]      CHIEF COMPLAINTS/REASON FOR VISIT:  Follow up for recurrent rectal cancer treatment  ASSESSMENT & PLAN:   Cancer Staging  Rectal cancer [facility]) Staging form: Colon and Rectum, AJCC 8th Edition - Clinical stage from 02/08/2021: Stage IIIB (cT4a, cN1, cM0) - Signed by Barry Patience, MD on 03/06/2021   Rectal cancer [facility]) locally advanced rectal cancer-Stage IIIB, s/p TNT neoadjuvant protocol, concurrent Xeloda and radiation-Finished 08/27/2021, s/p  APR,rectal adenocarcinoma, ypT3N0. Positive radial margin due to perforation. Biopsy proven recurrent rectal cancer, locally advanced disease, possible colovesical fistula; no distant metastatic disease on PET- per [name] Surgeon Dr. Luciano Cutter, recurrence is not resectable.  S/p concurrent Xeloda 825 mg/m2 twice daily  with radiation, last radiation 6/14 MRI pelvis w wo contrast showed persistent disease. Disease is not resectable per colorectal surgeon  Labs are reviewed and discussed with patient. Proceed with FOLFIRI + Panitumumab  Encounter for antineoplastic chemotherapy Chemotherapy plan as listed  Lung nodule Previously discussed with IR, CT-guided biopsy is difficult due to the position of small size. Negative on PET evaluation.  Attention on follow-up CT scans.   Hypokalemia Continue potassium daily.   Rectal pain Controlled with oxycodone 5-10 mg Q6 hours PRN.  he declined.Fentanyl patch,       Orders Placed This Encounter  Procedures   CBC with Differential (Cancer Center Only)    Standing Status:   Future    Standing Expiration Date:   06/28/2024   CMP (Cancer Center only)    Standing Status:   Future    Standing Expiration Date:   06/28/2024   Magnesium    Standing Status:   Future    Standing Expiration Date:   06/28/2024   CBC with Differential (Cancer Center Only)    Standing Status:   Future    Standing Expiration Date:    07/12/2024   CMP (Cancer Center only)    Standing Status:   Future    Standing Expiration Date:   07/12/2024   Magnesium    Standing Status:   Future    Standing Expiration Date:   07/12/2024   CBC with Differential (Cancer Center Only)    Standing Status:   Future    Standing Expiration Date:   07/26/2024   CMP (Cancer Center only)    Standing Status:   Future    Standing Expiration Date:   07/26/2024   Magnesium    Standing Status:   Future    Standing Expiration Date:   07/26/2024     Follow-up  2 weeks All questions were answered. The patient knows to call the clinic with any problems, questions or concerns.  Barry Patience, MD, PhD [facility] Hematology Oncology 06/01/2023        HISTORY OF PRESENTING ILLNESS:   Barry [name]. is a  57 y.o.  male presents for rectal cancer Oncology History  Rectal cancer (HCC)  02/08/2021 Initial Diagnosis   Rectal cancer   02/05/2021-02/06/2021 patient was hospitalized due to generalized weakness, intermittent lightheadedness, weight loss and worsening constipation.  02/05/2021 CT abdomen showed concerning of severe rectal wall thickening and right internal iliac lymph node concerning for metastatic disease.  Patient was seen by gastroenterology and had colonoscopy which showed a circumferential fungating mass in the rectum.  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   02/14/2021-02/16/2021 hospitalized due to rectal bleeding and pain.   02/14/2021 CT showed perirectal fluid collection/gas concerning for infection with  significant leukocytosis, anemia with hemoglobin of 6.8.  Patient received PRBC transfusion, IV antibiotics.  He underwent an IR guided placement of JP drain into the rectal abscess.  Discharged home with oral Augmentin. 02/20/2021 presented to ER with new skin opening and draining from left gluteus.  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  JP drain has been removed.  Patient was continued on  Augmentin.   02/13/2021, PET scan showed locally advanced rectal cancer with billowing of the mesorectum now with low attenuation material that was not present on previous examination with extensive stranding and inflammation.   Bulky RIGHT pelvic sidewall/hypogastric lymph node outside of the mesorectum with stippled calcification measuring 19 mm-no increased metabolic activity. Small LEFT hypogastric lymph node just peripheral to the internal external bifurcation-SUV 3.2 High RIGHT internal just below or at the internal/common iliac transition, lymph node -SUV 4.8 LEFT high hypogastric lymph node  8 mm-SUV 3. Scattered lymph nodes throughout the retroperitoneum with low FDG uptake Bilateral inguinal lymph nodes largest on the RIGHT (image 248/3) 11 mm with a maximum SUV of 2.8 spiculated nodule in the LEFT upper lobe- 9 x 8 mm       02/14/2021, CT abdomen pelvis without contrast Showed perforated rectal mass with contained perforation with fluid and gas extending above and below the pelvic floor,potentially involving the sphincter complex and extending into LEFT ischial rectal fossa   02/20/2021, CT pelvis with contrast showed Large perirectal/perianal abscess has markedly decreased in size since placement of the percutaneous drain. There are residual gas-filled collections in the soft tissues and suspect there is a fistula or sinus tract between the rectal mass and the subcutaneous tissues. Soft tissue gas along the medial left buttock and concern for a cutaneous ulceration in this area. Large rectal mass with evidence for a large necrotic right pelvic lymph node.   His case is complicated with a perirectal abscess. Prior to onset of perirectal abscess, on his 02/05/2021 scan, he was noted to have right internal iliac lymph node 3 x 1 x 2.3 cm which is concerning for nodal disease.On Subsequent images it was difficult to distinguish whether lymphadenopathy was due to nodal disease versus acute  inflammation. 03/07/2021 MRI pelvis cT3 N2. Due to the possible contained perforation on previous CT, possible cT4 disease.    02/27/2021 medi port placed by Dr.Dew 03/13/2021 reports right butt cheek and also left perianal area fullness.he was seen by Dr.Pabon urgently  and had right buttock abscess drained. Left perianal fullness was felt to be due to cancer.    02/08/2021 Cancer Staging   Staging form: Colon and Rectum, AJCC 8th Edition - Clinical stage from 02/08/2021: Stage IIIB (cT4a, cN1, cM0) - Signed by Barry Patience, MD on 03/06/2021 Stage prefix: Initial diagnosis   02/27/2021 Procedure   medi port placed by Dr.Dew   03/18/2021 - 07/05/2021 Chemotherapy    FOLFOX q14d x 4 months      07/17/2021 - 08/27/2021 Chemotherapy   Xeloda concurrent with Radiation   11/18/2021 Surgery   patient is status post open APR with flap for rectal cancer. Pathology rectal adenomacarcinoma ypT3N0.  Positive margin: Radial (circumferential) or mesenteric: adjacent to perforation      01/27/2022 - 02/02/2022 Hospital Admission   Hospitalized at [facility] due to recurrent abscess.  US guided drain into the pelvic abscess with return of 60 ml of purulent fluid. Cultures positive for staph aureus and strep agalactiae group b, fungal cultures negative.    02/25/2022 Imaging   CT  chest angiogram with and without contrast, CT abdomen pelvis with and without contrast 1. Negative for acute pulmonary embolus.2. Emphysema. Further decrease in size of the previously noted irregular nodule in the left upper lobe which is now barely measurable today. No new suspicious lung nodules 3. Status post left lower quadrant colostomy. Further decrease in size since comparison exam from May of the rim enhancing gas and fluid collection at the pelvic surgical bed extending from the perineum superiorly into the pelvis 4. Slightly thickened appearance of terminal ileal small bowel loops in the pelvis with mild stranding suggesting small bowel  inflammatory process. 5. Stable enlarged right pelvic sidewall lymph node   06/02/2022 Imaging   CT chest abdomen pelvis w contrast 1. Status post abdominal perineal resection with descending colostomy. 2. At the site of pelvic fluid and gas collection on 02/25/2022, there is residual, decreased presacral soft tissue fullness, without drainable collection. 3. Similar right obturator nodal metastasis.4.  No acute process or evidence of metastatic disease in the chest. 5. Age advanced coronary artery atherosclerosis. Recommend assessment of coronary risk factors. 6. Aortic atherosclerosis and emphysema     06/26/2022 Imaging   CT abdomen pelvis with contrast showed 1. Interval increase in size of a lobulated partially imaged at least 7.8 x 3.8 x 12 cm abscess in the perianal region in a patient status post abdominal perineal resection and left lower lobe end colostomy formation. Finding extends to involve the pre sacral region up to the urinary dome level. Associated posterior urinary bladder wall thickening with lack of intraperitoneal fat plane between the presacral soft tissue thickening/abscess formation suggestive of possible fistulization and invasion of the posterior bladder wall. Underlying recurrent malignancy is not excluded. 2. Stable 2.7 cm right pelvic sidewall lymph node. 3.  Aortic Atherosclerosis   06/26/2022 - 06/27/2022 Hospital Admission   He went to [facility] ER and CT showed recurrent abscess. He was transferred to [facility] due to recurrent abscess, treated with IV vanc/zosyn, IR was consulted and drainage tube was placed. Wound grew up Strep viridans. Patient was seen by ID at [facility] discharged with Augmentin for 2 weeks.   He was seen by wound care Dr> Kandace Blitz on 07/09/2022, JP drain was removed.    09/29/2022 Imaging   MRI pelvis without contrast showed  Interval abdominoperitoneal resection since prior MRI. Decreased small fluid collection in the surgical bed compared with more  recent CT, consistent with resolving postoperative fluid collection or abscess.   Bulky rounded presacral mass, which shows diffuse restricted diffusion, without significant change in size since most recent CT of 06/26/2022. This raises suspicion for recurrent carcinoma over post treatment changes. Recommend correlation with CEA level, and consider tissue sampling or PET-CT.   Stable enlarged right pelvic sidewall lymph node. No new or increased adenopathy identified.   10/09/2022 Imaging   CT chest with contrast showed 1. Unchanged 0.4 cm fissural nodule of the anterior left lower lobe. This is almost certainly a benign fissural lymph node. No new or suspicious pulmonary nodules. 2. Moderate emphysema and diffuse bilateral bronchial wall thickening. 3. Coronary artery disease.   10/13/2022 Imaging   CT abdomen pelvis with contrast showed 1. Complex collection persists in the lower pelvis, extending from the anal verge upwards into the presacral space and anteriorly from the presacral space to the bladder dome, not significantly changed in size or extent compared to the earlier CT of 06/26/2022. This is most likely a combination of a postoperative seroma and/or chronic phlegmon/abscess versus recurrent  abscess. 2. Bladder walls are thick walled/edematous. Given the contiguity of the presacral collection and the bladder dome, this is highly suspicious for a related bladder wall infection and possibly secondary to colovesical fistula. Recommend correlation with urinalysis. 3. No evidence of bowel obstruction. LEFT lower abdominal wall colostomy, without obstruction or inflammatory change. 4. No free intraperitoneal air   10/13/2022 [facility] Admission   Hospitalized due to fever and rectal pain. Urine culture showed mixed urogenital flora. IR CT guided Aspiration of abscess showed Streptococcal Viridans. He was placed back on Augmentin  10/14/2023 CT read by [facility] radiology Postsurgical changes of  abdominoperineal resection. Interval enlargement of  fluid collection in the surgical bed. Multiple foci of enhancing tissue at  the margins of this fluid collection, increased from prior study, suspicious for local recurrence.   Prominent retroperitoneal lymph nodes, some which are increased in size from prior study. These are indeterminate and may be either reactive or represent metastatic disease. Recommend continued attention on follow-up.   Centrally hypoattenuating right obturator lymph node, unchanged in size, consistent with treated disease.   Circumferential bladder wall thickening and irregularity, most pronounced on the left side. This may be reactive in the setting of adjacent pelvic inflammatory changes. Correlate with urinalysis if there is clinical  concern for urinary tract infection.    11/14/2022 Imaging   MRI pelvis w wo contrast  1. Status post abdominoperineal resection with left lower quadrant end colostomy. 2. Compared to prior CT and MR, no significant change in appearance of heterogeneous, rim enhancing presacral and low pelvic soft tissue and fluid. Largest heterogeneously enhancing conglomerate of fluid and soft tissue appears to closely involve the anteriorly abutting the seminal vesicles and measures 3.0 x 2.9 cm in largest axial dimension. This extends to superiorly contact the posterior bladder dome and inferiorly towards the gluteal cleft. 3. Discrete fluid component at the most inferior extent measuring 2.4 x 1.3 cm. This is markedly diminished in volume compared to more remote previous examinations, for example 06/26/2022. 4. Constellation of findings is highly concerning for locally recurrent rectal malignancy with or without superimposed infection. Presence or absence of infection at this time is not established by MR. 5. Additional unchanged hemorrhagic or proteinaceous fluid collection in the right hemipelvis measuring 3.2 x 2.6 cm most consistent with postoperative  hematoma or seroma. 6. Unchanged thickening of the urinary bladder wall. As previously reported, fistula or involvement of bladder wall by malignancy not excluded.   12/09/2022 Procedure   CT guided biopsy of the pelvic mass showed Moderately differentiated adenocarcinoma with dirty necrosis, morphologically identical to patient's prior rectal adenocarcinoma.   [facility] NGS xT 648 panel showed MLH3 start loss,  BCORL1 frame shift, TP53 splice region varian, APC stop gain,  TMB 7.9, MS stable, KRAS/BRAF/NRAS negative.   [facility] NGS xR showed no gene arrangement or reportable altered splicing events in RNA sequencing      01/07/2023 -  Chemotherapy   Xeloda 825 mg/m2 twice daily  with radiation   02/11/2023 Imaging   CT abdomen pelvis w contrast  1. Examination is positive for small bowel obstruction. Transition point is identified within the left iliac fossa. Distal to the transition point the small bowel loops exhibit mucosal enhancement and wall thickening concerning for enteritis. 2. There is new right-sided hydronephrosis and hydroureter up to the level of the bifurcation of right common iliac artery. The distal right ureter appears closely associated with the previously characterized tracer avid presacral soft tissue mass. Cannot  exclude obstructive uropathy secondary to tumor involvement. 3. Similar appearance of presacral soft tissue mass. This was tracer avid on the recent PET-CT from 12/17/2022, and concerning for locally recurrent tumor. 4. Unchanged diffuse circumferential wall thickening involving the urinary bladder. 5. Aortic Atherosclerosis    04/17/2023 -  Chemotherapy   Patient is on Treatment Plan : COLORECTAL FOLFIRI + Panitumumab q14d       Patient presented to emergency room on 12/08/21, rectal pain.  CT scan showed 18.3 cm fluid and gas collection in the pelvic surgical bed compatible with infected collection/abscess.  Patient was sent to [facility] and admitted for.  Patient has  CT-guided drain placement on 12/09/2021.  Blood culture positive for Staph epidermidis which was felt to be contaminant.  Fluid culture grew pansensitive Staph aureus, strep pyogenes and candida albicans.  Patient was treated with IV antibiotics and transition to p.o. Augmentin and fluconazole on 12/11/2021.  #Perirectal abscess status post drainage catheter , patient was recently seen by [facility] oncology surgeon on 12/24/2021 and was recommended to keep drainage catheter and continue antibiotics. Patient had a repeat CT scan done which showed a smaller but persistent abscess.  He will return to [facility] surgery for follow-up of drain removal. His case was discussed on [name] tumor board for the positive mesenteric margin which was felt to be secondary to previous perforation.  Recommend surveillance.  02/11/23 patient was admitted due to nausea, CT showed small bowel obstruction, severe constipation, NG tube was placed. His symptoms improved and has ostomy output, NG was removed and he tolerated PO.   During the interval ER visit due to abdominal pain.  03/02/23 CT abdomen pelvis w contrast showed . Findings are again suggestive of probable partial small bowel obstruction which appears likely related to enteritis. Specifically, there are multiple areas of mural thickening throughout the distal small bowel with increased mucosal and mural enhancement, with some very mild proximal small bowel dilatation. 2. Status post APR with left lower quadrant colostomy and large soft tissue mass in the low anatomic pelvis which is slightly more prominent than the recent prior study, and was previously hypermetabolic on prior PET-CT 12/17/2022, suggesting residual disease. 3. Probable nodal mass along the right pelvic sidewall in the right internal iliac nodal distribution, similar to the prior study, previously not hypermetabolic on prior PET-CT. No other lymphadenopathy or definitive signs of metastatic disease noted elsewhere on  today's examination.  4. Aortic atherosclerosis, as well as calcified atherosclerotic plaque in the right coronary artery. Please note that although the presence of coronary artery calcium documents the presence of coronary artery disease, the severity of this disease and any potential stenosis cannot be assessed on this non-gated CT examination. Assessment for potential risk factor modification, dietary therapy or pharmacologic therapy may be warranted, if clinically indicated. 5. Additional incidental findings, as above. Patient has radiation induced enteritis. His symptoms were mild he was discharged home.   INTERVAL HISTORY Barry [name]. is a 57 y.o. male who has above history reviewed by me today presents for follow up visit for management of recurrent locally advanced rectal cancer.  He feels  good today, Rectal pain is controlled, good appetite.  Denies fever chills, rectal pain. Denies rectal discharge.  Denies nausea vomiting or abdominal pain.      Review of Systems  Constitutional:  Negative for appetite change, chills, fatigue, fever and unexpected weight change.  HENT:   Negative for hearing loss and voice change.   Eyes:  Negative for  eye problems and icterus.  Respiratory:  Negative for chest tightness, cough and shortness of breath.   Cardiovascular:  Negative for chest pain and leg swelling.  Gastrointestinal:  Negative for abdominal distention, abdominal pain, blood in stool, constipation, diarrhea and rectal pain.  Endocrine: Negative for hot flashes.  Genitourinary:  Negative for difficulty urinating, dysuria and frequency.   Musculoskeletal:  Negative for arthralgias.  Skin:  Negative for itching and rash.  Neurological:  Negative for light-headedness and numbness.  Hematological:  Negative for adenopathy. Does not bruise/bleed easily.  Psychiatric/Behavioral:  Negative for confusion.     MEDICAL HISTORY:  Past Medical History:  Diagnosis Date   Headache     Left shoulder pain    Rectal cancer (HCC)     SURGICAL HISTORY: Past Surgical History:  Procedure Laterality Date   COLON SURGERY     COLONOSCOPY WITH PROPOFOL N/A 02/06/2021   Procedure: COLONOSCOPY WITH PROPOFOL;  Surgeon: Toney Reil, MD;  Location: [facility] ENDOSCOPY;  Service: Gastroenterology;  Laterality: N/A;   PORTA CATH INSERTION N/A 02/27/2021   Procedure: PORTA CATH INSERTION;  Surgeon: Annice Needy, MD;  Location: [facility] INVASIVE CV LAB;  Service: Cardiovascular;  Laterality: N/A;   ROTATOR CUFF REPAIR Left     SOCIAL HISTORY: Social History   Socioeconomic History   Marital status: Married    Spouse name: Not on file   Number of children: Not on file   Years of education: Not on file   Highest education level: Not on file  Occupational History   Not on file  Tobacco Use   Smoking status: Former    Current packs/day: 0.00    Average packs/day: 1 pack/day for 10.0 years (10.0 ttl pk-yrs)    Types: Cigars, Cigarettes    Start date: 02/05/2011    Quit date: 02/04/2021    Years since quitting: 2.3   Smokeless tobacco: Never  Substance and Sexual Activity   Alcohol use: Not Currently   Drug use: No   Sexual activity: Not on file  Other Topics Concern   Not on file  Social History Narrative   Not on file   Social Determinants of Health   Financial Resource Strain: High Risk (02/13/2023)   Received from [facility], [facility]   Overall Financial Resource Strain (CARDIA)    Difficulty of Paying Living Expenses: Hard  Food Insecurity: No Food Insecurity (02/13/2023)   Received from [facility], [facility]   Hunger Vital Sign    Worried About Running Out of Food in the Last Year: Never true    Ran Out of Food in the Last Year: Never true  Transportation Needs: No Transportation Needs (02/13/2023)   Received from [facility], [facility]   [facility] -  Transportation    In the past 12 months, has lack of transportation kept you from medical appointments or from getting medications?: No    Lack of Transportation (Non-Medical): No  Physical Activity: Inactive (11/03/2022)   Exercise Vital Sign    Days of Exercise per Week: 0 days    Minutes of Exercise per Session: 0 min  Stress: Stress Concern Present (11/03/2022)   [employer] of Occupational Health - Occupational Stress Questionnaire    Feeling of Stress : Rather much  Social Connections: Moderately Isolated (11/03/2022)   Social Connection and Isolation Panel [NHANES]    Frequency of Communication with Friends and Family: Twice a week  Frequency of Social Gatherings with Friends and Family: Three times a week    Attends Religious Services: 1 to 4 times per year    Active Member of Clubs or Organizations: No    Attends Banker Meetings: Never    Marital Status: Separated  Intimate Partner Violence: Not At Risk (11/03/2022)   Humiliation, Afraid, Rape, and Kick questionnaire    Fear of Current or Ex-Partner: No    Emotionally Abused: No    Physically Abused: No    Sexually Abused: No    FAMILY HISTORY: Family History  Problem Relation Age of Onset   Cancer Sister    Diabetes Mother    Cancer Maternal Grandmother    Cancer Paternal Grandmother     ALLERGIES:  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  MEDICATIONS:  Current Outpatient Medications  Medication Sig Dispense Refill   calcium-vitamin D (OSCAL WITH D) 500-5 MG-MCG tablet Take 2 tablets by mouth daily. 60 tablet 3   citalopram (CELEXA) 10 MG tablet Take 1 tablet (10 mg total) by mouth daily. 30 tablet 3   lidocaine-prilocaine (EMLA) cream Apply 1 Application topically as needed. Apply small amount to port and cover with saran wrap 1-2 hours prior to port access 30 g 2   loperamide (IMODIUM) 2 MG capsule Take 2 capsules (4mg ) by mouth initially. Then take 1 capsule by mouth every 2 hours (4mg  every 4 hours  at night). Take no more than 16mg /day 90 capsule 2   ondansetron (ZOFRAN) 8 MG tablet Take 1 tablet (8 mg total) by mouth every 8 (eight) hours as needed for nausea or vomiting. 90 tablet 1   oxyCODONE (ROXICODONE) 5 MG immediate release tablet Take 1-2 tablets (5-10 mg total) by mouth every 6 (six) hours as needed for severe pain. 90 tablet 0   potassium chloride (KLOR-CON M) 10 MEQ tablet Take 1 tablet (10 mEq total) by mouth 2 (two) times daily. 30 tablet 0   prochlorperazine (COMPAZINE) 10 MG tablet Take 1 tablet (10 mg total) by mouth every 6 (six) hours as needed for nausea or vomiting. 90 tablet 1   nicotine (NICODERM CQ - DOSED IN MG/24 HR) 7 mg/24hr patch Place 1 patch (7 mg total) onto the skin daily. (Patient not taking: Reported on 06/01/2023) 28 patch 2   senna (SENOKOT) 8.6 MG TABS tablet Take 2 tablets (17.2 mg total) by mouth daily. (Patient not taking: Reported on 06/01/2023) 100 tablet 0   traZODone (DESYREL) 50 MG tablet Take 1 tablet (50 mg total) by mouth at bedtime as needed for sleep. (Patient not taking: Reported on 04/28/2023) 30 tablet 3   No current facility-administered medications for this visit.   Facility-Administered Medications Ordered in Other Visits  Medication Dose Route Frequency Provider Last Rate Last Admin   0.9 %  sodium chloride infusion   Intravenous Once Barry Patience, MD       atropine injection 0.5 mg  0.5 mg Intravenous Once PRN Barry Patience, MD       dexamethasone (DECADRON) 10 mg in sodium chloride 0.9 % 50 mL IVPB  10 mg Intravenous Once Barry Patience, MD       fluorouracil (ADRUCIL) 5,000 mg in sodium chloride 0.9 % 150 mL chemo infusion  2,400 mg/m2 (Order-Specific) Intravenous 1 day or 1 dose Barry Patience, MD       fluorouracil (ADRUCIL) chemo injection 750 mg  400 mg/m2 (Order-Specific) Intravenous Once Barry Patience, MD       heparin lock flush  100 unit/mL  500 Units Intravenous Once Barry Patience, MD       irinotecan (CAMPTOSAR) 340 mg in sodium chloride 0.9 % 500 mL chemo  infusion  180 mg/m2 (Order-Specific) Intravenous Once Barry Patience, MD       leucovorin 760 mg in sodium chloride 0.9 % 250 mL infusion  400 mg/m2 (Order-Specific) Intravenous Once Barry Patience, MD       palonosetron (ALOXI) injection 0.25 mg  0.25 mg Intravenous Once Barry Patience, MD       panitumumab (VECTIBIX) 500 mg in sodium chloride 0.9 % 100 mL chemo infusion  6 mg/kg (Order-Specific) Intravenous Once Barry Patience, MD       sodium chloride flush (NS) 0.9 % injection 10 mL  10 mL Intracatheter PRN Barry Patience, MD   10 mL at 05/06/23 1246     PHYSICAL EXAMINATION: ECOG PERFORMANCE STATUS: 1 - Symptomatic but completely ambulatory Vitals:   06/01/23 0847  BP: (!) 122/94  Pulse: 93  Resp: 18  Temp: 97.6 F (36.4 C)  SpO2: 100%     Filed Weights   06/01/23 0847  Weight: 166 lb 12.8 oz (75.7 kg)      Physical Exam HENT:     Head: Normocephalic and atraumatic.  Eyes:     General: No scleral icterus. Cardiovascular:     Rate and Rhythm: Normal rate and regular rhythm.     Heart sounds: Normal heart sounds.  Pulmonary:     Effort: Pulmonary effort is normal. No respiratory distress.     Breath sounds: No wheezing.     Comments: Decreased breath sound bilaterally.  Abdominal:     General: Bowel sounds are normal. There is no distension.     Palpations: Abdomen is soft.     Comments: Colostomy bag  Genitourinary:    Comments: Status post APR, Musculoskeletal:        General: No deformity. Normal range of motion.     Cervical back: Normal range of motion and neck supple.  Skin:    General: Skin is warm and dry.     Findings: No erythema or rash.  Neurological:     Mental Status: He is alert and oriented to person, place, and time. Mental status is at baseline.     Cranial Nerves: No cranial nerve deficit.     Coordination: Coordination normal.  Psychiatric:        Mood and Affect: Mood normal.     LABORATORY DATA:  I have reviewed the data as listed    Latest Ref Rng & Units  06/01/2023    8:34 AM 05/18/2023    8:50 AM 05/04/2023    8:15 AM  CBC  WBC 4.0 - 10.5 K/uL 3.3  3.9  3.1   Hemoglobin 13.0 - 17.0 g/dL 16.1  09.6  04.5   Hematocrit 39.0 - 52.0 % 41.0  42.3  41.8   Platelets 150 - 400 K/uL 192  194  191       Latest Ref Rng & Units 06/01/2023    8:34 AM 05/18/2023    8:50 AM 05/04/2023    8:15 AM  CMP  Glucose 70 - 99 mg/dL 409  811  914   BUN 6 - 20 mg/dL 7  12  15    Creatinine 0.61 - 1.24 mg/dL 7.82  9.56  2.13   Sodium 135 - 145 mmol/L 135  136  136   Potassium 3.5 - 5.1 mmol/L 3.4  3.6  3.3  Chloride 98 - 111 mmol/L 102  104  106   CO2 22 - 32 mmol/L 25  25  26    Calcium 8.9 - 10.3 mg/dL 8.6  8.6  8.3   Total Protein 6.5 - 8.1 g/dL 7.3  7.8  7.2   Total Bilirubin 0.3 - 1.2 mg/dL 0.4  0.6  0.4   Alkaline Phos 38 - 126 U/L 85  87  82   AST 15 - 41 U/L 22  20  16    ALT 0 - 44 U/L 16  20  18       RADIOGRAPHIC STUDIES: I have personally reviewed the radiological images as listed and agreed with the findings in the report. MR PELVIS W WO CONTRAST  Result Date: 04/07/2023 CLINICAL DATA:  Recurrent rectal carcinoma. Presacral mass. Prior APR EXAM: MRI PELVIS WITHOUT AND WITH CONTRAST TECHNIQUE: Multiplanar multisequence MR imaging of the pelvis was performed both before and after administration of intravenous contrast. CONTRAST:  7mL GADAVIST GADOBUTROL 1 MMOL/ML IV SOLN COMPARISON:  CT on 03/02/2023 and MRI on 11/14/2022 FINDINGS: Lower Urinary Tract: Diffuse bladder wall thickening is stable, and may be due to previous radiation therapy or cystitis. Bowel: Postop changes from previous APR. Poorly defined and rounded masslike lesion is seen in the region of the Hartmann's pouch which shows peripheral nodular enhancement. This measures 5.0 x 3.8 cm on image 51/22, without significant change compared to most recent CT, but increased from earlier MRI when it measured approximately 3.0 x 2.9 cm. This is highly suspicious for recurrent rectal carcinoma.  Vascular/Lymphatic: Right internal iliac lymphadenopathy measures 3.1 x 2.4 cm on image 46/14, stable compared to prior CT and earlier MRI. No other pathologically enlarged lymph nodes identified. Reproductive:  No mass or other significant abnormality. Other: None. Musculoskeletal: No suspicious bone lesions identified. IMPRESSION: 5 cm masslike lesion in the region of the Hartmann's pouch, increased in size from earlier MRI several months ago, and highly suspicious for recurrent rectal carcinoma. Stable right internal iliac lymphadenopathy, consistent with metastatic disease. Stable diffuse bladder wall thickening, which may be due to previous radiation therapy or other etiology of cystitis. Electronically Signed   By: Danae Orleans M.D.   On: 04/07/2023 16:35

## 2023-06-01 NOTE — Assessment & Plan Note (Signed)
Previously discussed with IR, CT-guided biopsy is difficult due to the position of small size. Negative on PET evaluation.  Attention on follow-up CT scans.

## 2023-06-01 NOTE — Assessment & Plan Note (Signed)
Continue potassium daily.

## 2023-06-01 NOTE — Assessment & Plan Note (Signed)
Controlled with oxycodone 5-10 mg Q6 hours PRN.  he declined.Fentanyl patch,

## 2023-06-01 NOTE — Assessment & Plan Note (Signed)
Chemotherapy plan as listed

## 2023-06-03 ENCOUNTER — Inpatient Hospital Stay: Payer: 59

## 2023-06-03 VITALS — BP 137/83 | HR 90 | Resp 18

## 2023-06-03 DIAGNOSIS — R911 Solitary pulmonary nodule: Secondary | ICD-10-CM | POA: Diagnosis not present

## 2023-06-03 DIAGNOSIS — Z923 Personal history of irradiation: Secondary | ICD-10-CM | POA: Diagnosis not present

## 2023-06-03 DIAGNOSIS — Z5112 Encounter for antineoplastic immunotherapy: Secondary | ICD-10-CM | POA: Diagnosis not present

## 2023-06-03 DIAGNOSIS — Z5111 Encounter for antineoplastic chemotherapy: Secondary | ICD-10-CM | POA: Diagnosis not present

## 2023-06-03 DIAGNOSIS — C20 Malignant neoplasm of rectum: Secondary | ICD-10-CM | POA: Diagnosis not present

## 2023-06-03 DIAGNOSIS — Z79891 Long term (current) use of opiate analgesic: Secondary | ICD-10-CM | POA: Diagnosis not present

## 2023-06-03 DIAGNOSIS — E876 Hypokalemia: Secondary | ICD-10-CM | POA: Diagnosis not present

## 2023-06-03 DIAGNOSIS — G893 Neoplasm related pain (acute) (chronic): Secondary | ICD-10-CM | POA: Diagnosis not present

## 2023-06-03 MED ORDER — SODIUM CHLORIDE 0.9% FLUSH
10.0000 mL | INTRAVENOUS | Status: DC | PRN
  Administered 2023-06-03: 10 mL
  Filled 2023-06-03: qty 10

## 2023-06-03 MED ORDER — HEPARIN SOD (PORK) LOCK FLUSH 100 UNIT/ML IV SOLN
500.0000 [IU] | Freq: Once | INTRAVENOUS | Status: AC | PRN
  Administered 2023-06-03: 500 [IU]
  Filled 2023-06-03: qty 5

## 2023-06-09 ENCOUNTER — Encounter: Payer: Self-pay | Admitting: Oncology

## 2023-06-12 MED FILL — Dexamethasone Sodium Phosphate Inj 100 MG/10ML: INTRAMUSCULAR | Qty: 1 | Status: AC

## 2023-06-15 ENCOUNTER — Other Ambulatory Visit: Payer: Self-pay | Admitting: Oncology

## 2023-06-15 ENCOUNTER — Other Ambulatory Visit: Payer: Self-pay

## 2023-06-15 ENCOUNTER — Inpatient Hospital Stay: Payer: 59 | Attending: Oncology

## 2023-06-15 ENCOUNTER — Inpatient Hospital Stay: Payer: 59

## 2023-06-15 ENCOUNTER — Inpatient Hospital Stay (HOSPITAL_BASED_OUTPATIENT_CLINIC_OR_DEPARTMENT_OTHER): Payer: 59 | Admitting: Oncology

## 2023-06-15 ENCOUNTER — Encounter: Payer: Self-pay | Admitting: Oncology

## 2023-06-15 VITALS — BP 138/99 | HR 90 | Temp 96.0°F | Resp 18 | Wt 170.3 lb

## 2023-06-15 DIAGNOSIS — Z79899 Other long term (current) drug therapy: Secondary | ICD-10-CM | POA: Insufficient documentation

## 2023-06-15 DIAGNOSIS — Z5112 Encounter for antineoplastic immunotherapy: Secondary | ICD-10-CM | POA: Insufficient documentation

## 2023-06-15 DIAGNOSIS — C20 Malignant neoplasm of rectum: Secondary | ICD-10-CM | POA: Insufficient documentation

## 2023-06-15 DIAGNOSIS — Z87891 Personal history of nicotine dependence: Secondary | ICD-10-CM | POA: Insufficient documentation

## 2023-06-15 DIAGNOSIS — E876 Hypokalemia: Secondary | ICD-10-CM

## 2023-06-15 DIAGNOSIS — Z5111 Encounter for antineoplastic chemotherapy: Secondary | ICD-10-CM | POA: Diagnosis not present

## 2023-06-15 DIAGNOSIS — Z933 Colostomy status: Secondary | ICD-10-CM | POA: Diagnosis not present

## 2023-06-15 DIAGNOSIS — R911 Solitary pulmonary nodule: Secondary | ICD-10-CM | POA: Diagnosis not present

## 2023-06-15 DIAGNOSIS — Z923 Personal history of irradiation: Secondary | ICD-10-CM | POA: Diagnosis not present

## 2023-06-15 LAB — MAGNESIUM: Magnesium: 1.9 mg/dL (ref 1.7–2.4)

## 2023-06-15 LAB — CBC WITH DIFFERENTIAL (CANCER CENTER ONLY)
Abs Immature Granulocytes: 0.02 10*3/uL (ref 0.00–0.07)
Basophils Absolute: 0 10*3/uL (ref 0.0–0.1)
Basophils Relative: 1 %
Eosinophils Absolute: 0.1 10*3/uL (ref 0.0–0.5)
Eosinophils Relative: 3 %
HCT: 39.4 % (ref 39.0–52.0)
Hemoglobin: 13.9 g/dL (ref 13.0–17.0)
Immature Granulocytes: 1 %
Lymphocytes Relative: 23 %
Lymphs Abs: 0.8 10*3/uL (ref 0.7–4.0)
MCH: 32.7 pg (ref 26.0–34.0)
MCHC: 35.3 g/dL (ref 30.0–36.0)
MCV: 92.7 fL (ref 80.0–100.0)
Monocytes Absolute: 0.7 10*3/uL (ref 0.1–1.0)
Monocytes Relative: 21 %
Neutro Abs: 1.8 10*3/uL (ref 1.7–7.7)
Neutrophils Relative %: 51 %
Platelet Count: 161 10*3/uL (ref 150–400)
RBC: 4.25 MIL/uL (ref 4.22–5.81)
RDW: 15.1 % (ref 11.5–15.5)
WBC Count: 3.4 10*3/uL — ABNORMAL LOW (ref 4.0–10.5)
nRBC: 1.2 % — ABNORMAL HIGH (ref 0.0–0.2)

## 2023-06-15 LAB — CMP (CANCER CENTER ONLY)
ALT: 16 U/L (ref 0–44)
AST: 21 U/L (ref 15–41)
Albumin: 3.5 g/dL (ref 3.5–5.0)
Alkaline Phosphatase: 83 U/L (ref 38–126)
Anion gap: 8 (ref 5–15)
BUN: 9 mg/dL (ref 6–20)
CO2: 22 mmol/L (ref 22–32)
Calcium: 8.4 mg/dL — ABNORMAL LOW (ref 8.9–10.3)
Chloride: 106 mmol/L (ref 98–111)
Creatinine: 0.97 mg/dL (ref 0.61–1.24)
GFR, Estimated: >60 mL/min (ref >60)
Glucose, Bld: 170 mg/dL — ABNORMAL HIGH (ref 70–99)
Potassium: 3.2 mmol/L — ABNORMAL LOW (ref 3.5–5.1)
Sodium: 136 mmol/L (ref 135–145)
Total Bilirubin: 0.5 mg/dL (ref 0.3–1.2)
Total Protein: 6.9 g/dL (ref 6.5–8.1)

## 2023-06-15 MED ORDER — SODIUM CHLORIDE 0.9 % IV SOLN
180.0000 mg/m2 | Freq: Once | INTRAVENOUS | Status: AC
  Administered 2023-06-15: 340 mg via INTRAVENOUS
  Filled 2023-06-15: qty 2

## 2023-06-15 MED ORDER — PALONOSETRON HCL INJECTION 0.25 MG/5ML
0.2500 mg | Freq: Once | INTRAVENOUS | Status: AC
  Administered 2023-06-15: 0.25 mg via INTRAVENOUS
  Filled 2023-06-15: qty 5

## 2023-06-15 MED ORDER — SODIUM CHLORIDE 0.9 % IV SOLN
6.0000 mg/kg | Freq: Once | INTRAVENOUS | Status: AC
  Administered 2023-06-15: 500 mg via INTRAVENOUS
  Filled 2023-06-15: qty 5

## 2023-06-15 MED ORDER — HEPARIN SOD (PORK) LOCK FLUSH 100 UNIT/ML IV SOLN
500.0000 [IU] | Freq: Once | INTRAVENOUS | Status: DC
  Filled 2023-06-15: qty 5

## 2023-06-15 MED ORDER — SODIUM CHLORIDE 0.9 % IV SOLN
Freq: Once | INTRAVENOUS | Status: AC
  Filled 2023-06-15: qty 250

## 2023-06-15 MED ORDER — SODIUM CHLORIDE 0.9% FLUSH
10.0000 mL | Freq: Once | INTRAVENOUS | Status: AC
  Administered 2023-06-15: 10 mL via INTRAVENOUS
  Filled 2023-06-15: qty 10

## 2023-06-15 MED ORDER — SODIUM CHLORIDE 0.9 % IV SOLN
10.0000 mg | Freq: Once | INTRAVENOUS | Status: AC
  Administered 2023-06-15: 10 mg via INTRAVENOUS
  Filled 2023-06-15: qty 10

## 2023-06-15 MED ORDER — POTASSIUM CHLORIDE CRYS ER 10 MEQ PO TBCR
10.0000 meq | EXTENDED_RELEASE_TABLET | Freq: Every day | ORAL | 0 refills | Status: DC
  Filled 2023-06-15: qty 30, 30d supply, fill #0

## 2023-06-15 MED ORDER — SODIUM CHLORIDE 0.9 % IV SOLN
400.0000 mg/m2 | Freq: Once | INTRAVENOUS | Status: AC
  Administered 2023-06-15: 760 mg via INTRAVENOUS
  Filled 2023-06-15: qty 38

## 2023-06-15 MED ORDER — FLUOROURACIL CHEMO INJECTION 2.5 GM/50ML
400.0000 mg/m2 | Freq: Once | INTRAVENOUS | Status: AC
  Administered 2023-06-15: 750 mg via INTRAVENOUS
  Filled 2023-06-15: qty 15

## 2023-06-15 MED ORDER — SODIUM CHLORIDE 0.9 % IV SOLN
2400.0000 mg/m2 | INTRAVENOUS | Status: DC
  Administered 2023-06-15: 5000 mg via INTRAVENOUS
  Filled 2023-06-15: qty 100

## 2023-06-15 NOTE — Patient Instructions (Signed)
[facility]  Discharge Instructions: Thank you for choosing [facility] to provide your oncology and hematology care.  If you have a lab appointment with the Cancer Center, please go directly to the Cancer Center and check in at the registration area.  Wear comfortable clothing and clothing appropriate for easy access to any Portacath or PICC line.   We strive to give you quality time with your provider. You may need to reschedule your appointment if you arrive late (15 or more minutes).  Arriving late affects you and other patients whose appointments are after yours.  Also, if you miss three or more appointments without notifying the office, you may be dismissed from the clinic at the provider's discretion.      For prescription refill requests, have your pharmacy contact our office and allow 72 hours for refills to be completed.    Today you received the following chemotherapy and/or immunotherapy agents Vectibix, Irinotecan, Leucovorin & Adrucil      To help prevent nausea and vomiting after your treatment, we encourage you to take your nausea medication as directed.  BELOW ARE SYMPTOMS THAT SHOULD BE REPORTED IMMEDIATELY: *FEVER GREATER THAN 100.4 F (38 C) OR HIGHER *CHILLS OR SWEATING *NAUSEA AND VOMITING THAT IS NOT CONTROLLED WITH YOUR NAUSEA MEDICATION *UNUSUAL SHORTNESS OF BREATH *UNUSUAL BRUISING OR BLEEDING *URINARY PROBLEMS (pain or burning when urinating, or frequent urination) *BOWEL PROBLEMS (unusual diarrhea, constipation, pain near the anus) TENDERNESS IN MOUTH AND THROAT WITH OR WITHOUT PRESENCE OF ULCERS (sore throat, sores in mouth, or a toothache) UNUSUAL RASH, SWELLING OR PAIN  UNUSUAL VAGINAL DISCHARGE OR ITCHING   Items with * indicate a potential emergency and should be followed up as soon as possible or go to the Emergency Department if any problems should occur.  Please show the CHEMOTHERAPY ALERT CARD or  IMMUNOTHERAPY ALERT CARD at check-in to the Emergency Department and triage nurse.  Should you have questions after your visit or need to cancel or reschedule your appointment, please contact [facility]  [phone] and follow the prompts.  Office hours are 8:00 a.m. to 4:30 p.m. Monday - Friday. Please note that voicemails left after 4:00 p.m. may not be returned until the following business day.  We are closed weekends and major holidays. You have access to a nurse at all times for urgent questions. Please call the main number to the clinic [phone] and follow the prompts.  For any non-urgent questions, you may also contact your provider using MyChart. We now offer e-Visits for anyone 54 and older to request care online for non-urgent symptoms. For details visit [email or web address].   Also download the MyChart app! Go to the app store, search "MyChart", open the app, select [place], and log in with your MyChart username and password.

## 2023-06-15 NOTE — Assessment & Plan Note (Signed)
Chemotherapy plan as listed

## 2023-06-15 NOTE — Progress Notes (Signed)
Hematology/Oncology Progress note [phone] [phone]      CHIEF COMPLAINTS/REASON FOR VISIT:  Follow up for recurrent rectal cancer treatment  ASSESSMENT & PLAN:   Cancer Staging  Rectal cancer [facility]) Staging form: Colon and Rectum, AJCC 8th Edition - Clinical stage from 02/08/2021: Stage IIIB (cT4a, cN1, cM0) - Signed by [name], MD on 03/06/2021   Rectal cancer [facility]) locally advanced rectal cancer-Stage IIIB, s/p TNT neoadjuvant protocol, concurrent Xeloda and radiation-Finished 08/27/2021, s/p  APR,rectal adenocarcinoma, ypT3N0. Positive radial margin due to perforation. Biopsy proven recurrent rectal cancer, locally advanced disease, possible colovesical fistula; no distant metastatic disease on PET- per [name] Surgeon Dr. Luciano Cutter, recurrence is not resectable. --> June 2024 S/p concurrent Xeloda 825 mg/m2 twice daily  with radiation --> July 2024 MRI pelvis w wo contrast showed persistent disease. Disease is not resectable per colorectal surgeon --> FOLFIRI Panitumumab  Labs are reviewed and discussed with patient. Proceed with FOLFIRI + Panitumumab Repeat imaging after 6 cycles.   Encounter for antineoplastic chemotherapy Chemotherapy plan as listed  Lung nodule Previously discussed with IR, CT-guided biopsy is difficult due to the position of small size. Negative on PET evaluation.  Attention on follow-up CT scans.   Hypokalemia Continue potassium daily, refills sent     Orders Placed This Encounter  Procedures   CT CHEST ABDOMEN PELVIS W CONTRAST    Standing Status:   Future    Standing Expiration Date:   06/14/2024    Order Specific Question:   If indicated for the ordered procedure, I authorize the administration of contrast media per Radiology protocol    Answer:   Yes    Order Specific Question:   Does the patient have a contrast media/X-ray dye allergy?    Answer:   No    Order Specific Question:   Preferred imaging location?     Answer:   [facility]    Order Specific Question:   If indicated for the ordered procedure, I authorize the administration of oral contrast media per Radiology protocol    Answer:   Yes     Follow-up  2 weeks All questions were answered. The patient knows to call the clinic with any problems, questions or concerns.  [name], MD, PhD [facility] Hematology Oncology 06/15/2023        HISTORY OF PRESENTING ILLNESS:   Barry [name]. is a  57 y.o.  male presents for rectal cancer Oncology History  Rectal cancer (HCC)  02/08/2021 Initial Diagnosis   Rectal cancer   02/05/2021-02/06/2021 patient was hospitalized due to generalized weakness, intermittent lightheadedness, weight loss and worsening constipation.  02/05/2021 CT abdomen showed concerning of severe rectal wall thickening and right internal iliac lymph node concerning for metastatic disease.  Patient was seen by gastroenterology and had colonoscopy which showed a circumferential fungating mass in the rectum.  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   02/14/2021-02/16/2021 hospitalized due to rectal bleeding and pain.   02/14/2021 CT showed perirectal fluid collection/gas concerning for infection with significant leukocytosis, anemia with hemoglobin of 6.8.  Patient received PRBC transfusion, IV antibiotics.  He underwent an IR guided placement of JP drain into the rectal abscess.  Discharged home with oral Augmentin. 02/20/2021 presented to ER with new skin opening and draining from left gluteus.  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  JP drain has been removed.  Patient was continued on Augmentin.   02/13/2021, PET scan showed locally advanced rectal cancer with  billowing of the mesorectum now with low attenuation material that was not present on previous examination with extensive stranding and inflammation.   Bulky RIGHT pelvic sidewall/hypogastric lymph node outside of the mesorectum with  stippled calcification measuring 19 mm-no increased metabolic activity. Small LEFT hypogastric lymph node just peripheral to the internal external bifurcation-SUV 3.2 High RIGHT internal just below or at the internal/common iliac transition, lymph node -SUV 4.8 LEFT high hypogastric lymph node  8 mm-SUV 3. Scattered lymph nodes throughout the retroperitoneum with low FDG uptake Bilateral inguinal lymph nodes largest on the RIGHT (image 248/3) 11 mm with a maximum SUV of 2.8 spiculated nodule in the LEFT upper lobe- 9 x 8 mm       02/14/2021, CT abdomen pelvis without contrast Showed perforated rectal mass with contained perforation with fluid and gas extending above and below the pelvic floor,potentially involving the sphincter complex and extending into LEFT ischial rectal fossa   02/20/2021, CT pelvis with contrast showed Large perirectal/perianal abscess has markedly decreased in size since placement of the percutaneous drain. There are residual gas-filled collections in the soft tissues and suspect there is a fistula or sinus tract between the rectal mass and the subcutaneous tissues. Soft tissue gas along the medial left buttock and concern for a cutaneous ulceration in this area. Large rectal mass with evidence for a large necrotic right pelvic lymph node.   His case is complicated with a perirectal abscess. Prior to onset of perirectal abscess, on his 02/05/2021 scan, he was noted to have right internal iliac lymph node 3 x 1 x 2.3 cm which is concerning for nodal disease.On Subsequent images it was difficult to distinguish whether lymphadenopathy was due to nodal disease versus acute inflammation. 03/07/2021 MRI pelvis cT3 N2. Due to the possible contained perforation on previous CT, possible cT4 disease.    02/27/2021 medi port placed by Dr.Dew 03/13/2021 reports right butt cheek and also left perianal area fullness.he was seen by Dr.Pabon urgently  and had right buttock abscess drained. Left  perianal fullness was felt to be due to cancer.    02/08/2021 Cancer Staging   Staging form: Colon and Rectum, AJCC 8th Edition - Clinical stage from 02/08/2021: Stage IIIB (cT4a, cN1, cM0) - Signed by [name], MD on 03/06/2021 Stage prefix: Initial diagnosis   02/27/2021 Procedure   medi port placed by Dr.Dew   03/18/2021 - 07/05/2021 Chemotherapy    FOLFOX q14d x 4 months      07/17/2021 - 08/27/2021 Chemotherapy   Xeloda concurrent with Radiation   11/18/2021 Surgery   patient is status post open APR with flap for rectal cancer. Pathology rectal adenomacarcinoma ypT3N0.  Positive margin: Radial (circumferential) or mesenteric: adjacent to perforation      01/27/2022 - 02/02/2022 Hospital Admission   Hospitalized at [facility] due to recurrent abscess.  US guided drain into the pelvic abscess with return of 60 ml of purulent fluid. Cultures positive for staph aureus and strep agalactiae group b, fungal cultures negative.    02/25/2022 Imaging   CT chest angiogram with and without contrast, CT abdomen pelvis with and without contrast 1. Negative for acute pulmonary embolus.2. Emphysema. Further decrease in size of the previously noted irregular nodule in the left upper lobe which is now barely measurable today. No new suspicious lung nodules 3. Status post left lower quadrant colostomy. Further decrease in size since comparison exam from May of the rim enhancing gas and fluid collection at the pelvic surgical bed extending from the  perineum superiorly into the pelvis 4. Slightly thickened appearance of terminal ileal small bowel loops in the pelvis with mild stranding suggesting small bowel inflammatory process. 5. Stable enlarged right pelvic sidewall lymph node   06/02/2022 Imaging   CT chest abdomen pelvis w contrast 1. Status post abdominal perineal resection with descending colostomy. 2. At the site of pelvic fluid and gas collection on 02/25/2022, there is residual, decreased presacral  soft tissue fullness, without drainable collection. 3. Similar right obturator nodal metastasis.4.  No acute process or evidence of metastatic disease in the chest. 5. Age advanced coronary artery atherosclerosis. Recommend assessment of coronary risk factors. 6. Aortic atherosclerosis and emphysema     06/26/2022 Imaging   CT abdomen pelvis with contrast showed 1. Interval increase in size of a lobulated partially imaged at least 7.8 x 3.8 x 12 cm abscess in the perianal region in a patient status post abdominal perineal resection and left lower lobe end colostomy formation. Finding extends to involve the pre sacral region up to the urinary dome level. Associated posterior urinary bladder wall thickening with lack of intraperitoneal fat plane between the presacral soft tissue thickening/abscess formation suggestive of possible fistulization and invasion of the posterior bladder wall. Underlying recurrent malignancy is not excluded. 2. Stable 2.7 cm right pelvic sidewall lymph node. 3.  Aortic Atherosclerosis   06/26/2022 - 06/27/2022 Hospital Admission   He went to [facility] ER and CT showed recurrent abscess. He was transferred to [facility] due to recurrent abscess, treated with IV vanc/zosyn, IR was consulted and drainage tube was placed. Wound grew up Strep viridans. Patient was seen by ID at [facility] discharged with Augmentin for 2 weeks.   He was seen by wound care Dr> Kandace Blitz on 07/09/2022, JP drain was removed.    09/29/2022 Imaging   MRI pelvis without contrast showed  Interval abdominoperitoneal resection since prior MRI. Decreased small fluid collection in the surgical bed compared with more recent CT, consistent with resolving postoperative fluid collection or abscess.   Bulky rounded presacral mass, which shows diffuse restricted diffusion, without significant change in size since most recent CT of 06/26/2022. This raises suspicion for recurrent carcinoma over post treatment changes. Recommend  correlation with CEA level, and consider tissue sampling or PET-CT.   Stable enlarged right pelvic sidewall lymph node. No new or increased adenopathy identified.   10/09/2022 Imaging   CT chest with contrast showed 1. Unchanged 0.4 cm fissural nodule of the anterior left lower lobe. This is almost certainly a benign fissural lymph node. No new or suspicious pulmonary nodules. 2. Moderate emphysema and diffuse bilateral bronchial wall thickening. 3. Coronary artery disease.   10/13/2022 Imaging   CT abdomen pelvis with contrast showed 1. Complex collection persists in the lower pelvis, extending from the anal verge upwards into the presacral space and anteriorly from the presacral space to the bladder dome, not significantly changed in size or extent compared to the earlier CT of 06/26/2022. This is most likely a combination of a postoperative seroma and/or chronic phlegmon/abscess versus recurrent abscess. 2. Bladder walls are thick walled/edematous. Given the contiguity of the presacral collection and the bladder dome, this is highly suspicious for a related bladder wall infection and possibly secondary to colovesical fistula. Recommend correlation with urinalysis. 3. No evidence of bowel obstruction. LEFT lower abdominal wall colostomy, without obstruction or inflammatory change. 4. No free intraperitoneal air   10/13/2022 [facility] Admission   Hospitalized due to fever and rectal pain. Urine culture showed  mixed urogenital flora. IR CT guided Aspiration of abscess showed Streptococcal Viridans. He was placed back on Augmentin  10/14/2023 CT read by [facility] radiology Postsurgical changes of abdominoperineal resection. Interval enlargement of  fluid collection in the surgical bed. Multiple foci of enhancing tissue at  the margins of this fluid collection, increased from prior study, suspicious for local recurrence.   Prominent retroperitoneal lymph nodes, some which are increased in size from prior  study. These are indeterminate and may be either reactive or represent metastatic disease. Recommend continued attention on follow-up.   Centrally hypoattenuating right obturator lymph node, unchanged in size, consistent with treated disease.   Circumferential bladder wall thickening and irregularity, most pronounced on the left side. This may be reactive in the setting of adjacent pelvic inflammatory changes. Correlate with urinalysis if there is clinical  concern for urinary tract infection.    11/14/2022 Imaging   MRI pelvis w wo contrast  1. Status post abdominoperineal resection with left lower quadrant end colostomy. 2. Compared to prior CT and MR, no significant change in appearance of heterogeneous, rim enhancing presacral and low pelvic soft tissue and fluid. Largest heterogeneously enhancing conglomerate of fluid and soft tissue appears to closely involve the anteriorly abutting the seminal vesicles and measures 3.0 x 2.9 cm in largest axial dimension. This extends to superiorly contact the posterior bladder dome and inferiorly towards the gluteal cleft. 3. Discrete fluid component at the most inferior extent measuring 2.4 x 1.3 cm. This is markedly diminished in volume compared to more remote previous examinations, for example 06/26/2022. 4. Constellation of findings is highly concerning for locally recurrent rectal malignancy with or without superimposed infection. Presence or absence of infection at this time is not established by MR. 5. Additional unchanged hemorrhagic or proteinaceous fluid collection in the right hemipelvis measuring 3.2 x 2.6 cm most consistent with postoperative hematoma or seroma. 6. Unchanged thickening of the urinary bladder wall. As previously reported, fistula or involvement of bladder wall by malignancy not excluded.   12/09/2022 Procedure   CT guided biopsy of the pelvic mass showed Moderately differentiated adenocarcinoma with dirty necrosis, morphologically  identical to patient's prior rectal adenocarcinoma.   [facility] NGS xT 648 panel showed MLH3 start loss,  BCORL1 frame shift, TP53 splice region varian, APC stop gain,  TMB 7.9, MS stable, KRAS/BRAF/NRAS negative.   [facility] NGS xR showed no gene arrangement or reportable altered splicing events in RNA sequencing      01/07/2023 -  Chemotherapy   Xeloda 825 mg/m2 twice daily  with radiation   02/11/2023 Imaging   CT abdomen pelvis w contrast  1. Examination is positive for small bowel obstruction. Transition point is identified within the left iliac fossa. Distal to the transition point the small bowel loops exhibit mucosal enhancement and wall thickening concerning for enteritis. 2. There is new right-sided hydronephrosis and hydroureter up to the level of the bifurcation of right common iliac artery. The distal right ureter appears closely associated with the previously characterized tracer avid presacral soft tissue mass. Cannot exclude obstructive uropathy secondary to tumor involvement. 3. Similar appearance of presacral soft tissue mass. This was tracer avid on the recent PET-CT from 12/17/2022, and concerning for locally recurrent tumor. 4. Unchanged diffuse circumferential wall thickening involving the urinary bladder. 5. Aortic Atherosclerosis    04/17/2023 -  Chemotherapy   Patient is on Treatment Plan : COLORECTAL FOLFIRI + Panitumumab q14d       Patient presented to emergency room on 12/08/21, rectal  pain.  CT scan showed 18.3 cm fluid and gas collection in the pelvic surgical bed compatible with infected collection/abscess.  Patient was sent to [facility] and admitted for.  Patient has CT-guided drain placement on 12/09/2021.  Blood culture positive for Staph epidermidis which was felt to be contaminant.  Fluid culture grew pansensitive Staph aureus, strep pyogenes and candida albicans.  Patient was treated with IV antibiotics and transition to p.o. Augmentin and fluconazole on  12/11/2021.  #Perirectal abscess status post drainage catheter , patient was recently seen by [facility] oncology surgeon on 12/24/2021 and was recommended to keep drainage catheter and continue antibiotics. Patient had a repeat CT scan done which showed a smaller but persistent abscess.  He will return to [facility] surgery for follow-up of drain removal. His case was discussed on [name] tumor board for the positive mesenteric margin which was felt to be secondary to previous perforation.  Recommend surveillance.  02/11/23 patient was admitted due to nausea, CT showed small bowel obstruction, severe constipation, NG tube was placed. His symptoms improved and has ostomy output, NG was removed and he tolerated PO.   During the interval ER visit due to abdominal pain.  03/02/23 CT abdomen pelvis w contrast showed . Findings are again suggestive of probable partial small bowel obstruction which appears likely related to enteritis. Specifically, there are multiple areas of mural thickening throughout the distal small bowel with increased mucosal and mural enhancement, with some very mild proximal small bowel dilatation. 2. Status post APR with left lower quadrant colostomy and large soft tissue mass in the low anatomic pelvis which is slightly more prominent than the recent prior study, and was previously hypermetabolic on prior PET-CT 12/17/2022, suggesting residual disease. 3. Probable nodal mass along the right pelvic sidewall in the right internal iliac nodal distribution, similar to the prior study, previously not hypermetabolic on prior PET-CT. No other lymphadenopathy or definitive signs of metastatic disease noted elsewhere on today's examination.  4. Aortic atherosclerosis, as well as calcified atherosclerotic plaque in the right coronary artery. Please note that although the presence of coronary artery calcium documents the presence of coronary artery disease, the severity of this disease and any potential stenosis  cannot be assessed on this non-gated CT examination. Assessment for potential risk factor modification, dietary therapy or pharmacologic therapy may be warranted, if clinically indicated. 5. Additional incidental findings, as above. Patient has radiation induced enteritis. His symptoms were mild he was discharged home.   INTERVAL HISTORY Barry [name]. is a 57 y.o. male who has above history reviewed by me today presents for follow up visit for management of recurrent locally advanced rectal cancer.  He feels  good today, Rectal pain is controlled, good appetite.  Denies fever chills, rectal pain. Denies rectal discharge.  Denies nausea vomiting or abdominal pain.      Review of Systems  Constitutional:  Negative for appetite change, chills, fatigue, fever and unexpected weight change.  HENT:   Negative for hearing loss and voice change.   Eyes:  Negative for eye problems and icterus.  Respiratory:  Negative for chest tightness, cough and shortness of breath.   Cardiovascular:  Negative for chest pain and leg swelling.  Gastrointestinal:  Negative for abdominal distention, abdominal pain, blood in stool, constipation, diarrhea and rectal pain.  Endocrine: Negative for hot flashes.  Genitourinary:  Negative for difficulty urinating, dysuria and frequency.   Musculoskeletal:  Negative for arthralgias.  Skin:  Negative for itching and rash.  Neurological:  Negative for light-headedness and numbness.  Hematological:  Negative for adenopathy. Does not bruise/bleed easily.  Psychiatric/Behavioral:  Negative for confusion.     MEDICAL HISTORY:  Past Medical History:  Diagnosis Date   Headache    Left shoulder pain    Rectal cancer (HCC)     SURGICAL HISTORY: Past Surgical History:  Procedure Laterality Date   COLON SURGERY     COLONOSCOPY WITH PROPOFOL N/A 02/06/2021   Procedure: COLONOSCOPY WITH PROPOFOL;  Surgeon: Toney Reil, MD;  Location: [facility] ENDOSCOPY;  Service:  Gastroenterology;  Laterality: N/A;   PORTA CATH INSERTION N/A 02/27/2021   Procedure: PORTA CATH INSERTION;  Surgeon: Annice Needy, MD;  Location: [facility] INVASIVE CV LAB;  Service: Cardiovascular;  Laterality: N/A;   ROTATOR CUFF REPAIR Left     SOCIAL HISTORY: Social History   Socioeconomic History   Marital status: Married    Spouse name: Not on file   Number of children: Not on file   Years of education: Not on file   Highest education level: Not on file  Occupational History   Not on file  Tobacco Use   Smoking status: Former    Current packs/day: 0.00    Average packs/day: 1 pack/day for 10.0 years (10.0 ttl pk-yrs)    Types: Cigars, Cigarettes    Start date: 02/05/2011    Quit date: 02/04/2021    Years since quitting: 2.3   Smokeless tobacco: Never  Substance and Sexual Activity   Alcohol use: Not Currently   Drug use: No   Sexual activity: Not on file  Other Topics Concern   Not on file  Social History Narrative   Not on file   Social Determinants of Health   Financial Resource Strain: High Risk (02/13/2023)   Received from [facility], [facility]   Overall Financial Resource Strain (CARDIA)    Difficulty of Paying Living Expenses: Hard  Food Insecurity: No Food Insecurity (02/13/2023)   Received from [facility], [facility]   Hunger Vital Sign    Worried About Running Out of Food in the Last Year: Never true    Ran Out of Food in the Last Year: Never true  Transportation Needs: No Transportation Needs (02/13/2023)   Received from [facility], [facility]   [facility] - Transportation    In the past 12 months, has lack of transportation kept you from medical appointments or from getting medications?: No    Lack of Transportation (Non-Medical): No  Physical Activity: Inactive (11/03/2022)   Exercise Vital Sign    Days of Exercise per Week: 0 days    Minutes of  Exercise per Session: 0 min  Stress: Stress Concern Present (11/03/2022)   [employer] of Occupational Health - Occupational Stress Questionnaire    Feeling of Stress : Rather much  Social Connections: Moderately Isolated (11/03/2022)   Social Connection and Isolation Panel [NHANES]    Frequency of Communication with Friends and Family: Twice a week    Frequency of Social Gatherings with Friends and Family: Three times a week    Attends Religious Services: 1 to 4 times per year    Active Member of Clubs or Organizations: No    Attends Banker Meetings: Never    Marital Status: Separated  Intimate Partner Violence: Not At Risk (11/03/2022)   Humiliation, Afraid, Rape, and Kick questionnaire    Fear of Current or Ex-Partner: No  Emotionally Abused: No    Physically Abused: No    Sexually Abused: No    FAMILY HISTORY: Family History  Problem Relation Age of Onset   Cancer Sister    Diabetes Mother    Cancer Maternal Grandmother    Cancer Paternal Grandmother     ALLERGIES:  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  MEDICATIONS:  Current Outpatient Medications  Medication Sig Dispense Refill   calcium-vitamin D (OSCAL WITH D) 500-5 MG-MCG tablet Take 2 tablets by mouth daily. 60 tablet 3   citalopram (CELEXA) 10 MG tablet Take 1 tablet (10 mg total) by mouth daily. 30 tablet 3   lidocaine-prilocaine (EMLA) cream Apply 1 Application topically as needed. Apply small amount to port and cover with saran wrap 1-2 hours prior to port access 30 g 2   loperamide (IMODIUM) 2 MG capsule Take 2 capsules (4mg ) by mouth initially. Then take 1 capsule by mouth every 2 hours (4mg  every 4 hours at night). Take no more than 16mg /day 90 capsule 2   ondansetron (ZOFRAN) 8 MG tablet Take 1 tablet (8 mg total) by mouth every 8 (eight) hours as needed for nausea or vomiting. 90 tablet 1   oxyCODONE (ROXICODONE) 5 MG immediate release tablet Take 1-2 tablets (5-10 mg total) by mouth every 6  (six) hours as needed for severe pain. 90 tablet 0   potassium chloride (KLOR-CON M) 10 MEQ tablet Take 1 tablet (10 mEq total) by mouth 2 (two) times daily. 30 tablet 0   prochlorperazine (COMPAZINE) 10 MG tablet Take 1 tablet (10 mg total) by mouth every 6 (six) hours as needed for nausea or vomiting. 90 tablet 1   nicotine (NICODERM CQ - DOSED IN MG/24 HR) 7 mg/24hr patch Place 1 patch (7 mg total) onto the skin daily. (Patient not taking: Reported on 06/01/2023) 28 patch 2   senna (SENOKOT) 8.6 MG TABS tablet Take 2 tablets (17.2 mg total) by mouth daily. (Patient not taking: Reported on 06/01/2023) 100 tablet 0   traZODone (DESYREL) 50 MG tablet Take 1 tablet (50 mg total) by mouth at bedtime as needed for sleep. (Patient not taking: Reported on 04/28/2023) 30 tablet 3   No current facility-administered medications for this visit.   Facility-Administered Medications Ordered in Other Visits  Medication Dose Route Frequency Provider Last Rate Last Admin   heparin lock flush 100 unit/mL  500 Units Intravenous Once [name], MD       heparin lock flush 100 unit/mL  500 Units Intravenous Once [name], MD       sodium chloride flush (NS) 0.9 % injection 10 mL  10 mL Intracatheter PRN [name], MD   10 mL at 05/06/23 1246     PHYSICAL EXAMINATION: ECOG PERFORMANCE STATUS: 1 - Symptomatic but completely ambulatory Vitals:   06/15/23 0905 06/15/23 0912  BP: (!) 144/95 (!) 138/99  Pulse: 90   Resp: 18   Temp: (!) 96 F (35.6 C)   SpO2: 100%      Filed Weights   06/15/23 0905  Weight: 170 lb 4.8 oz (77.2 kg)      Physical Exam HENT:     Head: Normocephalic and atraumatic.  Eyes:     General: No scleral icterus. Cardiovascular:     Rate and Rhythm: Normal rate and regular rhythm.     Heart sounds: Normal heart sounds.  Pulmonary:     Effort: Pulmonary effort is normal. No respiratory distress.     Breath sounds: No  wheezing.     Comments: Decreased breath sound bilaterally.   Abdominal:     General: Bowel sounds are normal. There is no distension.     Palpations: Abdomen is soft.     Comments: Colostomy bag  Genitourinary:    Comments: Status post APR, Musculoskeletal:        General: No deformity. Normal range of motion.     Cervical back: Normal range of motion and neck supple.  Skin:    General: Skin is warm and dry.     Findings: No erythema or rash.  Neurological:     Mental Status: He is alert and oriented to person, place, and time. Mental status is at baseline.     Cranial Nerves: No cranial nerve deficit.     Coordination: Coordination normal.  Psychiatric:        Mood and Affect: Mood normal.     LABORATORY DATA:  I have reviewed the data as listed    Latest Ref Rng & Units 06/15/2023    8:45 AM 06/01/2023    8:34 AM 05/18/2023    8:50 AM  CBC  WBC 4.0 - 10.5 K/uL 3.4  3.3  3.9   Hemoglobin 13.0 - 17.0 g/dL 52.8  41.3  24.4   Hematocrit 39.0 - 52.0 % 39.4  41.0  42.3   Platelets 150 - 400 K/uL 161  192  194       Latest Ref Rng & Units 06/01/2023    8:34 AM 05/18/2023    8:50 AM 05/04/2023    8:15 AM  CMP  Glucose 70 - 99 mg/dL 010  272  536   BUN 6 - 20 mg/dL 7  12  15    Creatinine 0.61 - 1.24 mg/dL 6.44  0.34  7.42   Sodium 135 - 145 mmol/L 135  136  136   Potassium 3.5 - 5.1 mmol/L 3.4  3.6  3.3   Chloride 98 - 111 mmol/L 102  104  106   CO2 22 - 32 mmol/L 25  25  26    Calcium 8.9 - 10.3 mg/dL 8.6  8.6  8.3   Total Protein 6.5 - 8.1 g/dL 7.3  7.8  7.2   Total Bilirubin 0.3 - 1.2 mg/dL 0.4  0.6  0.4   Alkaline Phos 38 - 126 U/L 85  87  82   AST 15 - 41 U/L 22  20  16    ALT 0 - 44 U/L 16  20  18       RADIOGRAPHIC STUDIES: I have personally reviewed the radiological images as listed and agreed with the findings in the report. MR PELVIS W WO CONTRAST  Result Date: 04/07/2023 CLINICAL DATA:  Recurrent rectal carcinoma. Presacral mass. Prior APR EXAM: MRI PELVIS WITHOUT AND WITH CONTRAST TECHNIQUE: Multiplanar multisequence MR  imaging of the pelvis was performed both before and after administration of intravenous contrast. CONTRAST:  7mL GADAVIST GADOBUTROL 1 MMOL/ML IV SOLN COMPARISON:  CT on 03/02/2023 and MRI on 11/14/2022 FINDINGS: Lower Urinary Tract: Diffuse bladder wall thickening is stable, and may be due to previous radiation therapy or cystitis. Bowel: Postop changes from previous APR. Poorly defined and rounded masslike lesion is seen in the region of the Hartmann's pouch which shows peripheral nodular enhancement. This measures 5.0 x 3.8 cm on image 51/22, without significant change compared to most recent CT, but increased from earlier MRI when it measured approximately 3.0 x 2.9 cm. This is highly suspicious for recurrent rectal  carcinoma. Vascular/Lymphatic: Right internal iliac lymphadenopathy measures 3.1 x 2.4 cm on image 46/14, stable compared to prior CT and earlier MRI. No other pathologically enlarged lymph nodes identified. Reproductive:  No mass or other significant abnormality. Other: None. Musculoskeletal: No suspicious bone lesions identified. IMPRESSION: 5 cm masslike lesion in the region of the Hartmann's pouch, increased in size from earlier MRI several months ago, and highly suspicious for recurrent rectal carcinoma. Stable right internal iliac lymphadenopathy, consistent with metastatic disease. Stable diffuse bladder wall thickening, which may be due to previous radiation therapy or other etiology of cystitis. Electronically Signed   By: Danae Orleans M.D.   On: 04/07/2023 16:35

## 2023-06-15 NOTE — Assessment & Plan Note (Addendum)
Continue potassium daily, refills sent

## 2023-06-15 NOTE — Assessment & Plan Note (Addendum)
locally advanced rectal cancer-Stage IIIB, s/p TNT neoadjuvant protocol, concurrent Xeloda and radiation-Finished 08/27/2021, s/p  APR,rectal adenocarcinoma, ypT3N0. Positive radial margin due to perforation. Biopsy proven recurrent rectal cancer, locally advanced disease, possible colovesical fistula; no distant metastatic disease on PET- per [name] Surgeon Dr. Luciano Cutter, recurrence is not resectable. --> June 2024 S/p concurrent Xeloda 825 mg/m2 twice daily  with radiation --> July 2024 MRI pelvis w wo contrast showed persistent disease. Disease is not resectable per colorectal surgeon --> FOLFIRI Panitumumab  Labs are reviewed and discussed with patient. Proceed with FOLFIRI + Panitumumab Repeat imaging after 6 cycles.

## 2023-06-15 NOTE — Assessment & Plan Note (Signed)
Previously discussed with IR, CT-guided biopsy is difficult due to the position of small size. Negative on PET evaluation.  Attention on follow-up CT scans.

## 2023-06-17 ENCOUNTER — Inpatient Hospital Stay: Payer: 59

## 2023-06-17 VITALS — BP 118/71 | HR 88 | Resp 18

## 2023-06-17 DIAGNOSIS — Z5112 Encounter for antineoplastic immunotherapy: Secondary | ICD-10-CM | POA: Diagnosis not present

## 2023-06-17 DIAGNOSIS — Z79899 Other long term (current) drug therapy: Secondary | ICD-10-CM | POA: Diagnosis not present

## 2023-06-17 DIAGNOSIS — C20 Malignant neoplasm of rectum: Secondary | ICD-10-CM

## 2023-06-17 DIAGNOSIS — Z933 Colostomy status: Secondary | ICD-10-CM | POA: Diagnosis not present

## 2023-06-17 DIAGNOSIS — R911 Solitary pulmonary nodule: Secondary | ICD-10-CM | POA: Diagnosis not present

## 2023-06-17 DIAGNOSIS — E876 Hypokalemia: Secondary | ICD-10-CM | POA: Diagnosis not present

## 2023-06-17 DIAGNOSIS — Z923 Personal history of irradiation: Secondary | ICD-10-CM | POA: Diagnosis not present

## 2023-06-17 DIAGNOSIS — Z87891 Personal history of nicotine dependence: Secondary | ICD-10-CM | POA: Diagnosis not present

## 2023-06-17 DIAGNOSIS — Z5111 Encounter for antineoplastic chemotherapy: Secondary | ICD-10-CM | POA: Diagnosis not present

## 2023-06-17 MED ORDER — HEPARIN SOD (PORK) LOCK FLUSH 100 UNIT/ML IV SOLN
500.0000 [IU] | Freq: Once | INTRAVENOUS | Status: AC | PRN
  Administered 2023-06-17: 500 [IU]
  Filled 2023-06-17: qty 5

## 2023-06-17 MED ORDER — SODIUM CHLORIDE 0.9% FLUSH
10.0000 mL | INTRAVENOUS | Status: DC | PRN
  Administered 2023-06-17: 10 mL
  Filled 2023-06-17: qty 10

## 2023-06-20 DIAGNOSIS — C20 Malignant neoplasm of rectum: Secondary | ICD-10-CM | POA: Diagnosis not present

## 2023-06-26 MED ORDER — SODIUM CHLORIDE 0.9 % IV SOLN
2400.0000 mg/m2 | INTRAVENOUS | Status: DC
  Administered 2023-06-29: 5000 mg via INTRAVENOUS
  Filled 2023-06-26: qty 100

## 2023-06-29 ENCOUNTER — Inpatient Hospital Stay: Payer: 59

## 2023-06-29 ENCOUNTER — Encounter: Payer: Self-pay | Admitting: Oncology

## 2023-06-29 ENCOUNTER — Inpatient Hospital Stay (HOSPITAL_BASED_OUTPATIENT_CLINIC_OR_DEPARTMENT_OTHER): Payer: 59 | Admitting: Oncology

## 2023-06-29 VITALS — BP 122/89 | HR 96 | Temp 97.0°F | Resp 18 | Wt 169.7 lb

## 2023-06-29 DIAGNOSIS — Z79899 Other long term (current) drug therapy: Secondary | ICD-10-CM | POA: Diagnosis not present

## 2023-06-29 DIAGNOSIS — Z5111 Encounter for antineoplastic chemotherapy: Secondary | ICD-10-CM | POA: Diagnosis not present

## 2023-06-29 DIAGNOSIS — R911 Solitary pulmonary nodule: Secondary | ICD-10-CM | POA: Diagnosis not present

## 2023-06-29 DIAGNOSIS — C20 Malignant neoplasm of rectum: Secondary | ICD-10-CM

## 2023-06-29 DIAGNOSIS — Z87891 Personal history of nicotine dependence: Secondary | ICD-10-CM | POA: Diagnosis not present

## 2023-06-29 DIAGNOSIS — E876 Hypokalemia: Secondary | ICD-10-CM

## 2023-06-29 DIAGNOSIS — Z5112 Encounter for antineoplastic immunotherapy: Secondary | ICD-10-CM | POA: Diagnosis not present

## 2023-06-29 DIAGNOSIS — Z923 Personal history of irradiation: Secondary | ICD-10-CM | POA: Diagnosis not present

## 2023-06-29 DIAGNOSIS — Z933 Colostomy status: Secondary | ICD-10-CM | POA: Diagnosis not present

## 2023-06-29 LAB — MAGNESIUM: Magnesium: 1.8 mg/dL (ref 1.7–2.4)

## 2023-06-29 LAB — CBC WITH DIFFERENTIAL (CANCER CENTER ONLY)
Abs Immature Granulocytes: 0.02 10*3/uL (ref 0.00–0.07)
Basophils Absolute: 0 10*3/uL (ref 0.0–0.1)
Basophils Relative: 1 %
Eosinophils Absolute: 0.1 10*3/uL (ref 0.0–0.5)
Eosinophils Relative: 3 %
HCT: 39.2 % (ref 39.0–52.0)
Hemoglobin: 13.9 g/dL (ref 13.0–17.0)
Immature Granulocytes: 1 %
Lymphocytes Relative: 23 %
Lymphs Abs: 0.9 10*3/uL (ref 0.7–4.0)
MCH: 32.4 pg (ref 26.0–34.0)
MCHC: 35.5 g/dL (ref 30.0–36.0)
MCV: 91.4 fL (ref 80.0–100.0)
Monocytes Absolute: 0.9 10*3/uL (ref 0.1–1.0)
Monocytes Relative: 22 %
Neutro Abs: 2 10*3/uL (ref 1.7–7.7)
Neutrophils Relative %: 50 %
Platelet Count: 182 10*3/uL (ref 150–400)
RBC: 4.29 MIL/uL (ref 4.22–5.81)
RDW: 15.2 % (ref 11.5–15.5)
WBC Count: 3.9 10*3/uL — ABNORMAL LOW (ref 4.0–10.5)
nRBC: 0.8 % — ABNORMAL HIGH (ref 0.0–0.2)

## 2023-06-29 LAB — CMP (CANCER CENTER ONLY)
ALT: 16 U/L (ref 0–44)
AST: 22 U/L (ref 15–41)
Albumin: 3.6 g/dL (ref 3.5–5.0)
Alkaline Phosphatase: 97 U/L (ref 38–126)
Anion gap: 8 (ref 5–15)
BUN: 9 mg/dL (ref 6–20)
CO2: 25 mmol/L (ref 22–32)
Calcium: 8.4 mg/dL — ABNORMAL LOW (ref 8.9–10.3)
Chloride: 103 mmol/L (ref 98–111)
Creatinine: 1 mg/dL (ref 0.61–1.24)
GFR, Estimated: >60 mL/min (ref >60)
Glucose, Bld: 161 mg/dL — ABNORMAL HIGH (ref 70–99)
Potassium: 2.9 mmol/L — ABNORMAL LOW (ref 3.5–5.1)
Sodium: 136 mmol/L (ref 135–145)
Total Bilirubin: 0.4 mg/dL (ref 0.3–1.2)
Total Protein: 7.1 g/dL (ref 6.5–8.1)

## 2023-06-29 MED ORDER — PALONOSETRON HCL INJECTION 0.25 MG/5ML
0.2500 mg | Freq: Once | INTRAVENOUS | Status: AC
  Administered 2023-06-29: 0.25 mg via INTRAVENOUS
  Filled 2023-06-29: qty 5

## 2023-06-29 MED ORDER — ATROPINE SULFATE 1 MG/ML IV SOLN
0.5000 mg | Freq: Once | INTRAVENOUS | Status: AC | PRN
  Administered 2023-06-29: 0.5 mg via INTRAVENOUS
  Filled 2023-06-29: qty 1

## 2023-06-29 MED ORDER — SODIUM CHLORIDE 0.9 % IV SOLN
INTRAVENOUS | Status: DC
  Filled 2023-06-29: qty 250

## 2023-06-29 MED ORDER — SODIUM CHLORIDE 0.9 % IV SOLN
180.0000 mg/m2 | Freq: Once | INTRAVENOUS | Status: AC
  Administered 2023-06-29: 340 mg via INTRAVENOUS
  Filled 2023-06-29: qty 2

## 2023-06-29 MED ORDER — POTASSIUM CHLORIDE CRYS ER 20 MEQ PO TBCR: 20.0000 meq | EXTENDED_RELEASE_TABLET | Freq: Two times a day (BID) | ORAL | 0 refills | Status: DC

## 2023-06-29 MED ORDER — SODIUM CHLORIDE 0.9 % IV SOLN
400.0000 mg/m2 | Freq: Once | INTRAVENOUS | Status: AC
  Administered 2023-06-29: 760 mg via INTRAVENOUS
  Filled 2023-06-29: qty 38

## 2023-06-29 MED ORDER — DEXAMETHASONE SODIUM PHOSPHATE 10 MG/ML IJ SOLN
10.0000 mg | Freq: Once | INTRAMUSCULAR | Status: AC
  Administered 2023-06-29: 10 mg via INTRAVENOUS
  Filled 2023-06-29: qty 1

## 2023-06-29 MED ORDER — FLUOROURACIL CHEMO INJECTION 2.5 GM/50ML
400.0000 mg/m2 | Freq: Once | INTRAVENOUS | Status: AC
Start: 2023-06-29 — End: 2023-06-29
  Administered 2023-06-29: 750 mg via INTRAVENOUS
  Filled 2023-06-29: qty 15

## 2023-06-29 MED ORDER — SODIUM CHLORIDE 0.9 % IV SOLN
Freq: Once | INTRAVENOUS | Status: AC
  Filled 2023-06-29: qty 250

## 2023-06-29 MED ORDER — PANITUMUMAB CHEMO INJECTION 100 MG/5ML
6.0000 mg/kg | Freq: Once | INTRAVENOUS | Status: AC
  Administered 2023-06-29: 500 mg via INTRAVENOUS
  Filled 2023-06-29: qty 5

## 2023-06-29 MED ORDER — POTASSIUM CHLORIDE 20 MEQ/100ML IV SOLN
20.0000 meq | Freq: Once | INTRAVENOUS | Status: AC
  Administered 2023-06-29: 20 meq via INTRAVENOUS

## 2023-06-29 NOTE — Assessment & Plan Note (Signed)
locally advanced rectal cancer-Stage IIIB, s/p TNT neoadjuvant protocol, concurrent Xeloda and radiation-Finished 08/27/2021, s/p  APR,rectal adenocarcinoma, ypT3N0. Positive radial margin due to perforation. Biopsy proven recurrent rectal cancer, locally advanced disease, possible colovesical fistula; no distant metastatic disease on PET- per [name]geon Dr. Luciano Cutter, recurrence is not resectable. --> June 2024 S/p concurrent Xeloda 825 mg/m2 twice daily  with radiation --> July 2024 MRI pelvis w wo contrast showed persistent disease. Disease is not resectable per colorectal surgeon --> FOLFIRI Panitumumab  Labs are reviewed and discussed with patient. Proceed with FOLFIRI + Panitumumab Repeat imaging after 6 cycles.

## 2023-06-29 NOTE — Progress Notes (Signed)
Hematology/Oncology Progress note [phone] [phone]      CHIEF COMPLAINTS/REASON FOR VISIT:  Follow up for recurrent rectal cancer treatment  ASSESSMENT & PLAN:   Cancer Staging  Rectal cancer [facility]) Staging form: Colon and Rectum, AJCC 8th Edition - Clinical stage from 02/08/2021: Stage IIIB (cT4a, cN1, cM0) - Signed by [name], MD on 03/06/2021   Rectal cancer [facility]) locally advanced rectal cancer-Stage IIIB, s/p TNT neoadjuvant protocol, concurrent Xeloda and radiation-Finished 08/27/2021, s/p  APR,rectal adenocarcinoma, ypT3N0. Positive radial margin due to perforation. Biopsy proven recurrent rectal cancer, locally advanced disease, possible colovesical fistula; no distant metastatic disease on PET- per [name] Surgeon Dr. Luciano Cutter, recurrence is not resectable. --> June 2024 S/p concurrent Xeloda 825 mg/m2 twice daily  with radiation --> July 2024 MRI pelvis w wo contrast showed persistent disease. Disease is not resectable per colorectal surgeon --> FOLFIRI Panitumumab  Labs are reviewed and discussed with patient. Proceed with FOLFIRI + Panitumumab Repeat imaging after 6 cycles.   Encounter for antineoplastic chemotherapy Chemotherapy plan as listed  Lung nodule Previously discussed with IR, CT-guided biopsy is difficult due to the position of small size. Negative on PET evaluation.  Attention on follow-up CT scans.   Hypokalemia Recommend to increase potassium BID.  IV potassium chloride x 1 today     No orders of the defined types were placed in this encounter.    Follow-up  2 weeks All questions were answered. The patient knows to call the clinic with any problems, questions or concerns.  [name], MD, PhD [facility] Hematology Oncology 06/29/2023        HISTORY OF PRESENTING ILLNESS:   Barry [name]. is a  57 y.o.  male presents for rectal cancer Oncology History  Rectal cancer (HCC)  02/08/2021 Initial  Diagnosis   Rectal cancer   02/05/2021-02/06/2021 patient was hospitalized due to generalized weakness, intermittent lightheadedness, weight loss and worsening constipation.  02/05/2021 CT abdomen showed concerning of severe rectal wall thickening and right internal iliac lymph node concerning for metastatic disease.  Patient was seen by gastroenterology and had colonoscopy which showed a circumferential fungating mass in the rectum.  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   02/14/2021-02/16/2021 hospitalized due to rectal bleeding and pain.   02/14/2021 CT showed perirectal fluid collection/gas concerning for infection with significant leukocytosis, anemia with hemoglobin of 6.8.  Patient received PRBC transfusion, IV antibiotics.  He underwent an IR guided placement of JP drain into the rectal abscess.  Discharged home with oral Augmentin. 02/20/2021 presented to ER with new skin opening and draining from left gluteus.  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  JP drain has been removed.  Patient was continued on Augmentin.   02/13/2021, PET scan showed locally advanced rectal cancer with billowing of the mesorectum now with low attenuation material that was not present on previous examination with extensive stranding and inflammation.   Bulky RIGHT pelvic sidewall/hypogastric lymph node outside of the mesorectum with stippled calcification measuring 19 mm-no increased metabolic activity. Small LEFT hypogastric lymph node just peripheral to the internal external bifurcation-SUV 3.2 High RIGHT internal just below or at the internal/common iliac transition, lymph node -SUV 4.8 LEFT high hypogastric lymph node  8 mm-SUV 3. Scattered lymph nodes throughout the retroperitoneum with low FDG uptake Bilateral inguinal lymph nodes largest on the RIGHT (image 248/3) 11 mm with a maximum SUV of 2.8 spiculated nodule in the LEFT upper lobe- 9 x 8 mm  02/14/2021, CT abdomen pelvis  without contrast Showed perforated rectal mass with contained perforation with fluid and gas extending above and below the pelvic floor,potentially involving the sphincter complex and extending into LEFT ischial rectal fossa   02/20/2021, CT pelvis with contrast showed Large perirectal/perianal abscess has markedly decreased in size since placement of the percutaneous drain. There are residual gas-filled collections in the soft tissues and suspect there is a fistula or sinus tract between the rectal mass and the subcutaneous tissues. Soft tissue gas along the medial left buttock and concern for a cutaneous ulceration in this area. Large rectal mass with evidence for a large necrotic right pelvic lymph node.   His case is complicated with a perirectal abscess. Prior to onset of perirectal abscess, on his 02/05/2021 scan, he was noted to have right internal iliac lymph node 3 x 1 x 2.3 cm which is concerning for nodal disease.On Subsequent images it was difficult to distinguish whether lymphadenopathy was due to nodal disease versus acute inflammation. 03/07/2021 MRI pelvis cT3 N2. Due to the possible contained perforation on previous CT, possible cT4 disease.    02/27/2021 medi port placed by Dr.Dew 03/13/2021 reports right butt cheek and also left perianal area fullness.he was seen by Dr.Pabon urgently  and had right buttock abscess drained. Left perianal fullness was felt to be due to cancer.    02/08/2021 Cancer Staging   Staging form: Colon and Rectum, AJCC 8th Edition - Clinical stage from 02/08/2021: Stage IIIB (cT4a, cN1, cM0) - Signed by [name], MD on 03/06/2021 Stage prefix: Initial diagnosis   02/27/2021 Procedure   medi port placed by Dr.Dew   03/18/2021 - 07/05/2021 Chemotherapy    FOLFOX q14d x 4 months      07/17/2021 - 08/27/2021 Chemotherapy   Xeloda concurrent with Radiation   11/18/2021 Surgery   patient is status post open APR with flap for rectal cancer. Pathology rectal  adenomacarcinoma ypT3N0.  Positive margin: Radial (circumferential) or mesenteric: adjacent to perforation      01/27/2022 - 02/02/2022 Hospital Admission   Hospitalized at [facility] due to recurrent abscess.  US guided drain into the pelvic abscess with return of 60 ml of purulent fluid. Cultures positive for staph aureus and strep agalactiae group b, fungal cultures negative.    02/25/2022 Imaging   CT chest angiogram with and without contrast, CT abdomen pelvis with and without contrast 1. Negative for acute pulmonary embolus.2. Emphysema. Further decrease in size of the previously noted irregular nodule in the left upper lobe which is now barely measurable today. No new suspicious lung nodules 3. Status post left lower quadrant colostomy. Further decrease in size since comparison exam from May of the rim enhancing gas and fluid collection at the pelvic surgical bed extending from the perineum superiorly into the pelvis 4. Slightly thickened appearance of terminal ileal small bowel loops in the pelvis with mild stranding suggesting small bowel inflammatory process. 5. Stable enlarged right pelvic sidewall lymph node   06/02/2022 Imaging   CT chest abdomen pelvis w contrast 1. Status post abdominal perineal resection with descending colostomy. 2. At the site of pelvic fluid and gas collection on 02/25/2022, there is residual, decreased presacral soft tissue fullness, without drainable collection. 3. Similar right obturator nodal metastasis.4.  No acute process or evidence of metastatic disease in the chest. 5. Age advanced coronary artery atherosclerosis. Recommend assessment of coronary risk factors. 6. Aortic atherosclerosis and emphysema     06/26/2022 Imaging   CT abdomen pelvis  with contrast showed 1. Interval increase in size of a lobulated partially imaged at least 7.8 x 3.8 x 12 cm abscess in the perianal region in a patient status post abdominal perineal resection and left lower lobe  end colostomy formation. Finding extends to involve the pre sacral region up to the urinary dome level. Associated posterior urinary bladder wall thickening with lack of intraperitoneal fat plane between the presacral soft tissue thickening/abscess formation suggestive of possible fistulization and invasion of the posterior bladder wall. Underlying recurrent malignancy is not excluded. 2. Stable 2.7 cm right pelvic sidewall lymph node. 3.  Aortic Atherosclerosis   06/26/2022 - 06/27/2022 Hospital Admission   He went to [facility] ER and CT showed recurrent abscess. He was transferred to [facility] due to recurrent abscess, treated with IV vanc/zosyn, IR was consulted and drainage tube was placed. Wound grew up Strep viridans. Patient was seen by ID at [facility] discharged with Augmentin for 2 weeks.   He was seen by wound care Dr> Kandace Blitz on 07/09/2022, JP drain was removed.    09/29/2022 Imaging   MRI pelvis without contrast showed  Interval abdominoperitoneal resection since prior MRI. Decreased small fluid collection in the surgical bed compared with more recent CT, consistent with resolving postoperative fluid collection or abscess.   Bulky rounded presacral mass, which shows diffuse restricted diffusion, without significant change in size since most recent CT of 06/26/2022. This raises suspicion for recurrent carcinoma over post treatment changes. Recommend correlation with CEA level, and consider tissue sampling or PET-CT.   Stable enlarged right pelvic sidewall lymph node. No new or increased adenopathy identified.   10/09/2022 Imaging   CT chest with contrast showed 1. Unchanged 0.4 cm fissural nodule of the anterior left lower lobe. This is almost certainly a benign fissural lymph node. No new or suspicious pulmonary nodules. 2. Moderate emphysema and diffuse bilateral bronchial wall thickening. 3. Coronary artery disease.   10/13/2022 Imaging   CT abdomen pelvis with contrast showed 1. Complex  collection persists in the lower pelvis, extending from the anal verge upwards into the presacral space and anteriorly from the presacral space to the bladder dome, not significantly changed in size or extent compared to the earlier CT of 06/26/2022. This is most likely a combination of a postoperative seroma and/or chronic phlegmon/abscess versus recurrent abscess. 2. Bladder walls are thick walled/edematous. Given the contiguity of the presacral collection and the bladder dome, this is highly suspicious for a related bladder wall infection and possibly secondary to colovesical fistula. Recommend correlation with urinalysis. 3. No evidence of bowel obstruction. LEFT lower abdominal wall colostomy, without obstruction or inflammatory change. 4. No free intraperitoneal air   10/13/2022 [facility] Admission   Hospitalized due to fever and rectal pain. Urine culture showed mixed urogenital flora. IR CT guided Aspiration of abscess showed Streptococcal Viridans. He was placed back on Augmentin  10/14/2023 CT read by [facility] radiology Postsurgical changes of abdominoperineal resection. Interval enlargement of  fluid collection in the surgical bed. Multiple foci of enhancing tissue at  the margins of this fluid collection, increased from prior study, suspicious for local recurrence.   Prominent retroperitoneal lymph nodes, some which are increased in size from prior study. These are indeterminate and may be either reactive or represent metastatic disease. Recommend continued attention on follow-up.   Centrally hypoattenuating right obturator lymph node, unchanged in size, consistent with treated disease.   Circumferential bladder wall thickening and irregularity, most pronounced on the left side. This may  be reactive in the setting of adjacent pelvic inflammatory changes. Correlate with urinalysis if there is clinical  concern for urinary tract infection.    11/14/2022 Imaging   MRI pelvis w wo contrast  1.  Status post abdominoperineal resection with left lower quadrant end colostomy. 2. Compared to prior CT and MR, no significant change in appearance of heterogeneous, rim enhancing presacral and low pelvic soft tissue and fluid. Largest heterogeneously enhancing conglomerate of fluid and soft tissue appears to closely involve the anteriorly abutting the seminal vesicles and measures 3.0 x 2.9 cm in largest axial dimension. This extends to superiorly contact the posterior bladder dome and inferiorly towards the gluteal cleft. 3. Discrete fluid component at the most inferior extent measuring 2.4 x 1.3 cm. This is markedly diminished in volume compared to more remote previous examinations, for example 06/26/2022. 4. Constellation of findings is highly concerning for locally recurrent rectal malignancy with or without superimposed infection. Presence or absence of infection at this time is not established by MR. 5. Additional unchanged hemorrhagic or proteinaceous fluid collection in the right hemipelvis measuring 3.2 x 2.6 cm most consistent with postoperative hematoma or seroma. 6. Unchanged thickening of the urinary bladder wall. As previously reported, fistula or involvement of bladder wall by malignancy not excluded.   12/09/2022 Procedure   CT guided biopsy of the pelvic mass showed Moderately differentiated adenocarcinoma with dirty necrosis, morphologically identical to patient's prior rectal adenocarcinoma.   [facility] NGS xT 648 panel showed MLH3 start loss,  BCORL1 frame shift, TP53 splice region varian, APC stop gain,  TMB 7.9, MS stable, KRAS/BRAF/NRAS negative.   [facility] NGS xR showed no gene arrangement or reportable altered splicing events in RNA sequencing      01/07/2023 -  Chemotherapy   Xeloda 825 mg/m2 twice daily  with radiation   02/11/2023 Imaging   CT abdomen pelvis w contrast  1. Examination is positive for small bowel obstruction. Transition point is identified within the left  iliac fossa. Distal to the transition point the small bowel loops exhibit mucosal enhancement and wall thickening concerning for enteritis. 2. There is new right-sided hydronephrosis and hydroureter up to the level of the bifurcation of right common iliac artery. The distal right ureter appears closely associated with the previously characterized tracer avid presacral soft tissue mass. Cannot exclude obstructive uropathy secondary to tumor involvement. 3. Similar appearance of presacral soft tissue mass. This was tracer avid on the recent PET-CT from 12/17/2022, and concerning for locally recurrent tumor. 4. Unchanged diffuse circumferential wall thickening involving the urinary bladder. 5. Aortic Atherosclerosis    04/17/2023 -  Chemotherapy   Patient is on Treatment Plan : COLORECTAL FOLFIRI + Panitumumab q14d       Patient presented to emergency room on 12/08/21, rectal pain.  CT scan showed 18.3 cm fluid and gas collection in the pelvic surgical bed compatible with infected collection/abscess.  Patient was sent to [facility] and admitted for.  Patient has CT-guided drain placement on 12/09/2021.  Blood culture positive for Staph epidermidis which was felt to be contaminant.  Fluid culture grew pansensitive Staph aureus, strep pyogenes and candida albicans.  Patient was treated with IV antibiotics and transition to p.o. Augmentin and fluconazole on 12/11/2021.  #Perirectal abscess status post drainage catheter , patient was recently seen by [facility] oncology surgeon on 12/24/2021 and was recommended to keep drainage catheter and continue antibiotics. Patient had a repeat CT scan done which showed a smaller but persistent abscess.  He will return  to [name] surgery for follow-up of drain removal. His case was discussed on [name] tumor board for the positive mesenteric margin which was felt to be secondary to previous perforation.  Recommend surveillance.  02/11/23 patient was admitted due to nausea, CT showed small bowel  obstruction, severe constipation, NG tube was placed. His symptoms improved and has ostomy output, NG was removed and he tolerated PO.   During the interval ER visit due to abdominal pain.  03/02/23 CT abdomen pelvis w contrast showed . Findings are again suggestive of probable partial small bowel obstruction which appears likely related to enteritis. Specifically, there are multiple areas of mural thickening throughout the distal small bowel with increased mucosal and mural enhancement, with some very mild proximal small bowel dilatation. 2. Status post APR with left lower quadrant colostomy and large soft tissue mass in the low anatomic pelvis which is slightly more prominent than the recent prior study, and was previously hypermetabolic on prior PET-CT 12/17/2022, suggesting residual disease. 3. Probable nodal mass along the right pelvic sidewall in the right internal iliac nodal distribution, similar to the prior study, previously not hypermetabolic on prior PET-CT. No other lymphadenopathy or definitive signs of metastatic disease noted elsewhere on today's examination.  4. Aortic atherosclerosis, as well as calcified atherosclerotic plaque in the right coronary artery. Please note that although the presence of coronary artery calcium documents the presence of coronary artery disease, the severity of this disease and any potential stenosis cannot be assessed on this non-gated CT examination. Assessment for potential risk factor modification, dietary therapy or pharmacologic therapy may be warranted, if clinically indicated. 5. Additional incidental findings, as above. Patient has radiation induced enteritis. His symptoms were mild he was discharged home.   INTERVAL HISTORY Barry [name]. is a 57 y.o. male who has above history reviewed by me today presents for follow up visit for management of recurrent locally advanced rectal cancer.  He feels  good today, Rectal pain is controlled, good  appetite.  Denies fever chills, rectal pain. Denies rectal discharge.  Denies nausea vomiting or abdominal pain.      Review of Systems  Constitutional:  Negative for appetite change, chills, fatigue, fever and unexpected weight change.  HENT:   Negative for hearing loss and voice change.   Eyes:  Negative for eye problems and icterus.  Respiratory:  Negative for chest tightness, cough and shortness of breath.   Cardiovascular:  Negative for chest pain and leg swelling.  Gastrointestinal:  Negative for abdominal distention, abdominal pain, blood in stool, constipation, diarrhea and rectal pain.  Endocrine: Negative for hot flashes.  Genitourinary:  Negative for difficulty urinating, dysuria and frequency.   Musculoskeletal:  Negative for arthralgias.  Skin:  Negative for itching and rash.  Neurological:  Negative for light-headedness and numbness.  Hematological:  Negative for adenopathy. Does not bruise/bleed easily.  Psychiatric/Behavioral:  Negative for confusion.     MEDICAL HISTORY:  Past Medical History:  Diagnosis Date   Headache    Left shoulder pain    Rectal cancer (HCC)     SURGICAL HISTORY: Past Surgical History:  Procedure Laterality Date   COLON SURGERY     COLONOSCOPY WITH PROPOFOL N/A 02/06/2021   Procedure: COLONOSCOPY WITH PROPOFOL;  Surgeon: Toney Reil, MD;  Location: [facility] ENDOSCOPY;  Service: Gastroenterology;  Laterality: N/A;   PORTA CATH INSERTION N/A 02/27/2021   Procedure: PORTA CATH INSERTION;  Surgeon: Annice Needy, MD;  Location: [facility] INVASIVE CV LAB;  Service:  Cardiovascular;  Laterality: N/A;   ROTATOR CUFF REPAIR Left     SOCIAL HISTORY: Social History   Socioeconomic History   Marital status: Married    Spouse name: Not on file   Number of children: Not on file   Years of education: Not on file   Highest education level: Not on file  Occupational History   Not on file  Tobacco Use   Smoking status: Former    Current  packs/day: 0.00    Average packs/day: 1 pack/day for 10.0 years (10.0 ttl pk-yrs)    Types: Cigars, Cigarettes    Start date: 02/05/2011    Quit date: 02/04/2021    Years since quitting: 2.3   Smokeless tobacco: Never  Substance and Sexual Activity   Alcohol use: Not Currently   Drug use: No   Sexual activity: Not on file  Other Topics Concern   Not on file  Social History Narrative   Not on file   Social Determinants of Health   Financial Resource Strain: High Risk (02/13/2023)   Received from [facility], [facility]   Overall Financial Resource Strain (CARDIA)    Difficulty of Paying Living Expenses: Hard  Food Insecurity: No Food Insecurity (02/13/2023)   Received from [facility], [facility]   Hunger Vital Sign    Worried About Running Out of Food in the Last Year: Never true    Ran Out of Food in the Last Year: Never true  Transportation Needs: No Transportation Needs (02/13/2023)   Received from [facility], [facility]   [facility] - Transportation    In the past 12 months, has lack of transportation kept you from medical appointments or from getting medications?: No    Lack of Transportation (Non-Medical): No  Physical Activity: Inactive (11/03/2022)   Exercise Vital Sign    Days of Exercise per Week: 0 days    Minutes of Exercise per Session: 0 min  Stress: Stress Concern Present (11/03/2022)   [employer] of Occupational Health - Occupational Stress Questionnaire    Feeling of Stress : Rather much  Social Connections: Moderately Isolated (11/03/2022)   Social Connection and Isolation Panel [NHANES]    Frequency of Communication with Friends and Family: Twice a week    Frequency of Social Gatherings with Friends and Family: Three times a week    Attends Religious Services: 1 to 4 times per year    Active Member of Clubs or Organizations: No    Attends Tax inspector Meetings: Never    Marital Status: Separated  Intimate Partner Violence: Not At Risk (11/03/2022)   Humiliation, Afraid, Rape, and Kick questionnaire    Fear of Current or Ex-Partner: No    Emotionally Abused: No    Physically Abused: No    Sexually Abused: No    FAMILY HISTORY: Family History  Problem Relation Age of Onset   Cancer Sister    Diabetes Mother    Cancer Maternal Grandmother    Cancer Paternal Grandmother     ALLERGIES:  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  MEDICATIONS:  Current Outpatient Medications  Medication Sig Dispense Refill   calcium-vitamin D (OSCAL WITH D) 500-5 MG-MCG tablet Take 2 tablets by mouth daily. 60 tablet 3   citalopram (CELEXA) 10 MG tablet Take 1 tablet (10 mg total) by mouth daily. 30 tablet 3   lidocaine-prilocaine (EMLA) cream Apply 1 Application topically as needed. Apply small  amount to port and cover with saran wrap 1-2 hours prior to port access 30 g 2   loperamide (IMODIUM) 2 MG capsule Take 2 capsules (4mg ) by mouth initially. Then take 1 capsule by mouth every 2 hours (4mg  every 4 hours at night). Take no more than 16mg /day 90 capsule 2   ondansetron (ZOFRAN) 8 MG tablet Take 1 tablet (8 mg total) by mouth every 8 (eight) hours as needed for nausea or vomiting. 90 tablet 1   potassium chloride SA (KLOR-CON M) 20 MEQ tablet Take 1 tablet (20 mEq total) by mouth 2 (two) times daily. 60 tablet 0   prochlorperazine (COMPAZINE) 10 MG tablet Take 1 tablet (10 mg total) by mouth every 6 (six) hours as needed for nausea or vomiting. 90 tablet 1   nicotine (NICODERM CQ - DOSED IN MG/24 HR) 7 mg/24hr patch Place 1 patch (7 mg total) onto the skin daily. (Patient not taking: Reported on 06/01/2023) 28 patch 2   oxyCODONE (ROXICODONE) 5 MG immediate release tablet Take 1-2 tablets (5-10 mg total) by mouth every 6 (six) hours as needed for severe pain. (Patient not taking: Reported on 06/29/2023) 90 tablet 0   senna (SENOKOT) 8.6 MG TABS  tablet Take 2 tablets (17.2 mg total) by mouth daily. (Patient not taking: Reported on 06/01/2023) 100 tablet 0   traZODone (DESYREL) 50 MG tablet Take 1 tablet (50 mg total) by mouth at bedtime as needed for sleep. (Patient not taking: Reported on 04/28/2023) 30 tablet 3   No current facility-administered medications for this visit.   Facility-Administered Medications Ordered in Other Visits  Medication Dose Route Frequency Provider Last Rate Last Admin   atropine injection 0.5 mg  0.5 mg Intravenous Once PRN [name], MD       fluorouracil (ADRUCIL) 5,000 mg in sodium chloride 0.9 % 150 mL chemo infusion  2,400 mg/m2 (Order-Specific) Intravenous 1 day or 1 dose [name], MD       fluorouracil (ADRUCIL) chemo injection 750 mg  400 mg/m2 (Order-Specific) Intravenous Once [name], MD       heparin lock flush 100 unit/mL  500 Units Intravenous Once [name], MD       irinotecan (CAMPTOSAR) 340 mg in sodium chloride 0.9 % 500 mL chemo infusion  180 mg/m2 (Order-Specific) Intravenous Once [name], MD       leucovorin 760 mg in sodium chloride 0.9 % 250 mL infusion  400 mg/m2 (Order-Specific) Intravenous Once [name], MD       panitumumab (VECTIBIX) 500 mg in sodium chloride 0.9 % 100 mL chemo infusion  6 mg/kg (Order-Specific) Intravenous Once [name], MD       potassium chloride 20 mEq in 100 mL IVPB  20 mEq Intravenous Once [name], MD 100 mL/hr at 06/29/23 0937 20 mEq at 06/29/23 0937   sodium chloride flush (NS) 0.9 % injection 10 mL  10 mL Intracatheter PRN [name], MD   10 mL at 05/06/23 1246     PHYSICAL EXAMINATION: ECOG PERFORMANCE STATUS: 1 - Symptomatic but completely ambulatory Vitals:   06/29/23 0835 06/29/23 0859  BP: (!) 134/93 122/89  Pulse: 96   Resp: 18   Temp: (!) 97 F (36.1 C)      Filed Weights   06/29/23 0835  Weight: 169 lb 11.2 oz (77 kg)      Physical Exam HENT:     Head: Normocephalic and atraumatic.  Eyes:     General: No scleral  icterus. Cardiovascular:     Rate and Rhythm: Normal rate and regular rhythm.     Heart sounds: Normal heart sounds.  Pulmonary:     Effort: Pulmonary effort is normal. No respiratory distress.     Breath sounds: No wheezing.     Comments: Decreased breath sound bilaterally.  Abdominal:     General: Bowel sounds are normal. There is no distension.     Palpations: Abdomen is soft.     Comments: Colostomy bag  Genitourinary:    Comments: Status post APR, Musculoskeletal:        General: No deformity. Normal range of motion.     Cervical back: Normal range of motion and neck supple.  Skin:    General: Skin is warm and dry.     Findings: No erythema or rash.  Neurological:     Mental Status: He is alert and oriented to person, place, and time. Mental status is at baseline.     Cranial Nerves: No cranial nerve deficit.     Coordination: Coordination normal.  Psychiatric:        Mood and Affect: Mood normal.     LABORATORY DATA:  I have reviewed the data as listed    Latest Ref Rng & Units 06/29/2023    8:11 AM 06/15/2023    8:45 AM 06/01/2023    8:34 AM  CBC  WBC 4.0 - 10.5 K/uL 3.9  3.4  3.3   Hemoglobin 13.0 - 17.0 g/dL 40.1  02.7  25.3   Hematocrit 39.0 - 52.0 % 39.2  39.4  41.0   Platelets 150 - 400 K/uL 182  161  192       Latest Ref Rng & Units 06/29/2023    8:11 AM 06/15/2023    8:45 AM 06/01/2023    8:34 AM  CMP  Glucose 70 - 99 mg/dL 664  403  474   BUN 6 - 20 mg/dL 9  9  7    Creatinine 0.61 - 1.24 mg/dL 2.59  5.63  8.75   Sodium 135 - 145 mmol/L 136  136  135   Potassium 3.5 - 5.1 mmol/L 2.9  3.2  3.4   Chloride 98 - 111 mmol/L 103  106  102   CO2 22 - 32 mmol/L 25  22  25    Calcium 8.9 - 10.3 mg/dL 8.4  8.4  8.6   Total Protein 6.5 - 8.1 g/dL 7.1  6.9  7.3   Total Bilirubin 0.3 - 1.2 mg/dL 0.4  0.5  0.4   Alkaline Phos 38 - 126 U/L 97  83  85   AST 15 - 41 U/L 22  21  22    ALT 0 - 44 U/L 16  16  16       RADIOGRAPHIC STUDIES: I have personally  reviewed the radiological images as listed and agreed with the findings in the report. No results found.

## 2023-06-29 NOTE — Patient Instructions (Signed)
[facility]  Discharge Instructions: Thank you for choosing [facility] to provide your oncology and hematology care.  If you have a lab appointment with the Cancer Center, please go directly to the Cancer Center and check in at the registration area.  Wear comfortable clothing and clothing appropriate for easy access to any Portacath or PICC line.   We strive to give you quality time with your provider. You may need to reschedule your appointment if you arrive late (15 or more minutes).  Arriving late affects you and other patients whose appointments are after yours.  Also, if you miss three or more appointments without notifying the office, you may be dismissed from the clinic at the provider's discretion.      For prescription refill requests, have your pharmacy contact our office and allow 72 hours for refills to be completed.    Today you received the following chemotherapy and/or immunotherapy agents- vectibix, leucovorin, irinotecan, 5FU      To help prevent nausea and vomiting after your treatment, we encourage you to take your nausea medication as directed.  BELOW ARE SYMPTOMS THAT SHOULD BE REPORTED IMMEDIATELY: *FEVER GREATER THAN 100.4 F (38 C) OR HIGHER *CHILLS OR SWEATING *NAUSEA AND VOMITING THAT IS NOT CONTROLLED WITH YOUR NAUSEA MEDICATION *UNUSUAL SHORTNESS OF BREATH *UNUSUAL BRUISING OR BLEEDING *URINARY PROBLEMS (pain or burning when urinating, or frequent urination) *BOWEL PROBLEMS (unusual diarrhea, constipation, pain near the anus) TENDERNESS IN MOUTH AND THROAT WITH OR WITHOUT PRESENCE OF ULCERS (sore throat, sores in mouth, or a toothache) UNUSUAL RASH, SWELLING OR PAIN  UNUSUAL VAGINAL DISCHARGE OR ITCHING   Items with * indicate a potential emergency and should be followed up as soon as possible or go to the Emergency Department if any problems should occur.  Please show the CHEMOTHERAPY ALERT CARD or  IMMUNOTHERAPY ALERT CARD at check-in to the Emergency Department and triage nurse.  Should you have questions after your visit or need to cancel or reschedule your appointment, please contact [place] [facility]  [phone] and follow the prompts.  Office hours are 8:00 a.m. to 4:30 p.m. Monday - Friday. Please note that voicemails left after 4:00 p.m. may not be returned until the following business day.  We are closed weekends and major holidays. You have access to a nurse at all times for urgent questions. Please call the main number to the clinic [phone] and follow the prompts.  For any non-urgent questions, you may also contact your provider using MyChart. We now offer e-Visits for anyone 16 and older to request care online for non-urgent symptoms. For details visit [email or web address].   Also download the MyChart app! Go to the app store, search "MyChart", open the app, select , and log in with your MyChart username and password.

## 2023-06-29 NOTE — Assessment & Plan Note (Signed)
Previously discussed with IR, CT-guided biopsy is difficult due to the position of small size. Negative on PET evaluation.  Attention on follow-up CT scans.

## 2023-06-29 NOTE — Assessment & Plan Note (Signed)
Recommend to increase potassium BID.  IV potassium chloride x 1 today

## 2023-06-29 NOTE — Progress Notes (Signed)
Pt here for follow up. No new concerns voiced.   

## 2023-06-29 NOTE — Assessment & Plan Note (Signed)
Chemotherapy plan as listed

## 2023-06-30 LAB — CEA: CEA: 2.4 ng/mL (ref 0.0–4.7)

## 2023-07-01 ENCOUNTER — Inpatient Hospital Stay: Payer: 59

## 2023-07-01 VITALS — BP 135/90 | HR 88 | Resp 18

## 2023-07-01 DIAGNOSIS — Z95828 Presence of other vascular implants and grafts: Secondary | ICD-10-CM

## 2023-07-01 DIAGNOSIS — Z87891 Personal history of nicotine dependence: Secondary | ICD-10-CM | POA: Diagnosis not present

## 2023-07-01 DIAGNOSIS — Z923 Personal history of irradiation: Secondary | ICD-10-CM | POA: Diagnosis not present

## 2023-07-01 DIAGNOSIS — Z79899 Other long term (current) drug therapy: Secondary | ICD-10-CM | POA: Diagnosis not present

## 2023-07-01 DIAGNOSIS — R911 Solitary pulmonary nodule: Secondary | ICD-10-CM | POA: Diagnosis not present

## 2023-07-01 DIAGNOSIS — E876 Hypokalemia: Secondary | ICD-10-CM | POA: Diagnosis not present

## 2023-07-01 DIAGNOSIS — Z5112 Encounter for antineoplastic immunotherapy: Secondary | ICD-10-CM | POA: Diagnosis not present

## 2023-07-01 DIAGNOSIS — C20 Malignant neoplasm of rectum: Secondary | ICD-10-CM | POA: Diagnosis not present

## 2023-07-01 DIAGNOSIS — Z933 Colostomy status: Secondary | ICD-10-CM | POA: Diagnosis not present

## 2023-07-01 DIAGNOSIS — Z5111 Encounter for antineoplastic chemotherapy: Secondary | ICD-10-CM | POA: Diagnosis not present

## 2023-07-01 MED ORDER — SODIUM CHLORIDE 0.9% FLUSH
10.0000 mL | Freq: Once | INTRAVENOUS | Status: AC
  Administered 2023-07-01: 10 mL via INTRAVENOUS
  Filled 2023-07-01: qty 10

## 2023-07-01 MED ORDER — HEPARIN SOD (PORK) LOCK FLUSH 100 UNIT/ML IV SOLN
500.0000 [IU] | Freq: Once | INTRAVENOUS | Status: AC
  Administered 2023-07-01: 500 [IU] via INTRAVENOUS
  Filled 2023-07-01: qty 5

## 2023-07-13 ENCOUNTER — Inpatient Hospital Stay: Payer: Medicare Other | Attending: Oncology

## 2023-07-13 ENCOUNTER — Other Ambulatory Visit: Payer: Self-pay

## 2023-07-13 ENCOUNTER — Encounter: Payer: Self-pay | Admitting: Oncology

## 2023-07-13 ENCOUNTER — Inpatient Hospital Stay (HOSPITAL_BASED_OUTPATIENT_CLINIC_OR_DEPARTMENT_OTHER): Payer: Medicare Other | Admitting: Oncology

## 2023-07-13 ENCOUNTER — Inpatient Hospital Stay: Payer: Medicare Other

## 2023-07-13 VITALS — HR 98

## 2023-07-13 VITALS — BP 126/90 | HR 107 | Temp 97.0°F | Resp 18 | Wt 172.6 lb

## 2023-07-13 DIAGNOSIS — L708 Other acne: Secondary | ICD-10-CM

## 2023-07-13 DIAGNOSIS — Z923 Personal history of irradiation: Secondary | ICD-10-CM | POA: Diagnosis not present

## 2023-07-13 DIAGNOSIS — Z933 Colostomy status: Secondary | ICD-10-CM | POA: Diagnosis not present

## 2023-07-13 DIAGNOSIS — Z5111 Encounter for antineoplastic chemotherapy: Secondary | ICD-10-CM

## 2023-07-13 DIAGNOSIS — E876 Hypokalemia: Secondary | ICD-10-CM | POA: Diagnosis not present

## 2023-07-13 DIAGNOSIS — R911 Solitary pulmonary nodule: Secondary | ICD-10-CM | POA: Insufficient documentation

## 2023-07-13 DIAGNOSIS — C20 Malignant neoplasm of rectum: Secondary | ICD-10-CM | POA: Diagnosis present

## 2023-07-13 DIAGNOSIS — L308 Other specified dermatitis: Secondary | ICD-10-CM | POA: Insufficient documentation

## 2023-07-13 DIAGNOSIS — Z5112 Encounter for antineoplastic immunotherapy: Secondary | ICD-10-CM | POA: Insufficient documentation

## 2023-07-13 LAB — CMP (CANCER CENTER ONLY)
ALT: 14 U/L (ref 0–44)
AST: 17 U/L (ref 15–41)
Albumin: 3.6 g/dL (ref 3.5–5.0)
Alkaline Phosphatase: 90 U/L (ref 38–126)
Anion gap: 8 (ref 5–15)
BUN: 9 mg/dL (ref 6–20)
CO2: 24 mmol/L (ref 22–32)
Calcium: 9 mg/dL (ref 8.9–10.3)
Chloride: 103 mmol/L (ref 98–111)
Creatinine: 0.99 mg/dL (ref 0.61–1.24)
GFR, Estimated: >60 mL/min (ref >60)
Glucose, Bld: 182 mg/dL — ABNORMAL HIGH (ref 70–99)
Potassium: 3.9 mmol/L (ref 3.5–5.1)
Sodium: 135 mmol/L (ref 135–145)
Total Bilirubin: 0.4 mg/dL (ref <1.2)
Total Protein: 7.4 g/dL (ref 6.5–8.1)

## 2023-07-13 LAB — CBC WITH DIFFERENTIAL (CANCER CENTER ONLY)
Abs Immature Granulocytes: 0.03 10*3/uL (ref 0.00–0.07)
Basophils Absolute: 0 10*3/uL (ref 0.0–0.1)
Basophils Relative: 1 %
Eosinophils Absolute: 0.1 10*3/uL (ref 0.0–0.5)
Eosinophils Relative: 3 %
HCT: 39.8 % (ref 39.0–52.0)
Hemoglobin: 14 g/dL (ref 13.0–17.0)
Immature Granulocytes: 1 %
Lymphocytes Relative: 23 %
Lymphs Abs: 1 10*3/uL (ref 0.7–4.0)
MCH: 32.6 pg (ref 26.0–34.0)
MCHC: 35.2 g/dL (ref 30.0–36.0)
MCV: 92.8 fL (ref 80.0–100.0)
Monocytes Absolute: 0.7 10*3/uL (ref 0.1–1.0)
Monocytes Relative: 16 %
Neutro Abs: 2.4 10*3/uL (ref 1.7–7.7)
Neutrophils Relative %: 56 %
Platelet Count: 242 10*3/uL (ref 150–400)
RBC: 4.29 MIL/uL (ref 4.22–5.81)
RDW: 15.3 % (ref 11.5–15.5)
WBC Count: 4.3 10*3/uL (ref 4.0–10.5)
nRBC: 0 % (ref 0.0–0.2)

## 2023-07-13 LAB — MAGNESIUM: Magnesium: 1.7 mg/dL (ref 1.7–2.4)

## 2023-07-13 MED ORDER — SODIUM CHLORIDE 0.9 % IV SOLN
180.0000 mg/m2 | Freq: Once | INTRAVENOUS | Status: AC
  Administered 2023-07-13: 340 mg via INTRAVENOUS
  Filled 2023-07-13: qty 2

## 2023-07-13 MED ORDER — SODIUM CHLORIDE 0.9 % IV SOLN
Freq: Once | INTRAVENOUS | Status: AC
  Filled 2023-07-13: qty 250

## 2023-07-13 MED ORDER — SODIUM CHLORIDE 0.9 % IV SOLN
400.0000 mg/m2 | Freq: Once | INTRAVENOUS | Status: AC
  Administered 2023-07-13: 760 mg via INTRAVENOUS
  Filled 2023-07-13: qty 38

## 2023-07-13 MED ORDER — CLINDAMYCIN PHOSPHATE 1 % EX GEL
Freq: Two times a day (BID) | CUTANEOUS | 4 refills | Status: AC
  Filled 2023-07-13 – 2023-08-10 (×2): qty 30, 20d supply, fill #0
  Filled 2023-08-10: qty 30, 30d supply, fill #0

## 2023-07-13 MED ORDER — FLUOROURACIL CHEMO INJECTION 2.5 GM/50ML
400.0000 mg/m2 | Freq: Once | INTRAVENOUS | Status: AC
  Administered 2023-07-13: 750 mg via INTRAVENOUS
  Filled 2023-07-13: qty 15

## 2023-07-13 MED ORDER — PALONOSETRON HCL INJECTION 0.25 MG/5ML
0.2500 mg | Freq: Once | INTRAVENOUS | Status: AC
  Administered 2023-07-13: 0.25 mg via INTRAVENOUS
  Filled 2023-07-13: qty 5

## 2023-07-13 MED ORDER — DEXAMETHASONE SODIUM PHOSPHATE 10 MG/ML IJ SOLN
10.0000 mg | Freq: Once | INTRAMUSCULAR | Status: AC
  Administered 2023-07-13: 10 mg via INTRAVENOUS
  Filled 2023-07-13: qty 1

## 2023-07-13 MED ORDER — POTASSIUM CHLORIDE CRYS ER 20 MEQ PO TBCR
20.0000 meq | EXTENDED_RELEASE_TABLET | Freq: Two times a day (BID) | ORAL | 2 refills | Status: DC
  Filled 2023-07-13: qty 60, 30d supply, fill #0

## 2023-07-13 MED ORDER — ATROPINE SULFATE 1 MG/ML IV SOLN
0.5000 mg | Freq: Once | INTRAVENOUS | Status: AC | PRN
  Administered 2023-07-13: 0.5 mg via INTRAVENOUS
  Filled 2023-07-13: qty 1

## 2023-07-13 MED ORDER — SODIUM CHLORIDE 0.9 % IV SOLN
6.0000 mg/kg | Freq: Once | INTRAVENOUS | Status: AC
  Administered 2023-07-13: 500 mg via INTRAVENOUS
  Filled 2023-07-13: qty 20

## 2023-07-13 MED ORDER — SODIUM CHLORIDE 0.9 % IV SOLN
2400.0000 mg/m2 | INTRAVENOUS | Status: DC
  Administered 2023-07-13: 5000 mg via INTRAVENOUS
  Filled 2023-07-13: qty 100

## 2023-07-13 NOTE — Assessment & Plan Note (Signed)
Previously discussed with IR, CT-guided biopsy is difficult due to the position of small size. Negative on PET evaluation.  Attention on follow-up CT scans.

## 2023-07-13 NOTE — Assessment & Plan Note (Signed)
Due to chemotherapy.  Recommend clinamycin gel Topical BID PRN

## 2023-07-13 NOTE — Assessment & Plan Note (Signed)
locally advanced rectal cancer-Stage IIIB, s/p TNT neoadjuvant protocol, concurrent Xeloda and radiation-Finished 08/27/2021, s/p  APR,rectal adenocarcinoma, ypT3N0. Positive radial margin due to perforation. Biopsy proven recurrent rectal cancer, locally advanced disease, possible colovesical fistula; no distant metastatic disease on PET- per [name]geon Dr. Luciano Cutter, recurrence is not resectable. --> June 2024 S/p concurrent Xeloda 825 mg/m2 twice daily  with radiation --> July 2024 MRI pelvis w wo contrast showed persistent disease. Disease is not resectable per colorectal surgeon --> FOLFIRI Panitumumab  Labs are reviewed and discussed with patient. Proceed with FOLFIRI + Panitumumab Repeat imaging after 6 cycles.

## 2023-07-13 NOTE — Patient Instructions (Signed)
[facility]  Discharge Instructions: Thank you for choosing [facility] to provide your oncology and hematology care.  If you have a lab appointment with the Cancer Center, please go directly to the Cancer Center and check in at the registration area.  Wear comfortable clothing and clothing appropriate for easy access to any Portacath or PICC line.   We strive to give you quality time with your provider. You may need to reschedule your appointment if you arrive late (15 or more minutes).  Arriving late affects you and other patients whose appointments are after yours.  Also, if you miss three or more appointments without notifying the office, you may be dismissed from the clinic at the provider's discretion.      For prescription refill requests, have your pharmacy contact our office and allow 72 hours for refills to be completed.    Today you received the following chemotherapy and/or immunotherapy agents- vectibix, leucovorin, irinotecan, 5FU      To help prevent nausea and vomiting after your treatment, we encourage you to take your nausea medication as directed.  BELOW ARE SYMPTOMS THAT SHOULD BE REPORTED IMMEDIATELY: *FEVER GREATER THAN 100.4 F (38 C) OR HIGHER *CHILLS OR SWEATING *NAUSEA AND VOMITING THAT IS NOT CONTROLLED WITH YOUR NAUSEA MEDICATION *UNUSUAL SHORTNESS OF BREATH *UNUSUAL BRUISING OR BLEEDING *URINARY PROBLEMS (pain or burning when urinating, or frequent urination) *BOWEL PROBLEMS (unusual diarrhea, constipation, pain near the anus) TENDERNESS IN MOUTH AND THROAT WITH OR WITHOUT PRESENCE OF ULCERS (sore throat, sores in mouth, or a toothache) UNUSUAL RASH, SWELLING OR PAIN  UNUSUAL VAGINAL DISCHARGE OR ITCHING   Items with * indicate a potential emergency and should be followed up as soon as possible or go to the Emergency Department if any problems should occur.  Please show the CHEMOTHERAPY ALERT CARD or  IMMUNOTHERAPY ALERT CARD at check-in to the Emergency Department and triage nurse.  Should you have questions after your visit or need to cancel or reschedule your appointment, please contact [place] [facility]  [phone] and follow the prompts.  Office hours are 8:00 a.m. to 4:30 p.m. Monday - Friday. Please note that voicemails left after 4:00 p.m. may not be returned until the following business day.  We are closed weekends and major holidays. You have access to a nurse at all times for urgent questions. Please call the main number to the clinic [phone] and follow the prompts.  For any non-urgent questions, you may also contact your provider using MyChart. We now offer e-Visits for anyone 16 and older to request care online for non-urgent symptoms. For details visit [email or web address].   Also download the MyChart app! Go to the app store, search "MyChart", open the app, select , and log in with your MyChart username and password.

## 2023-07-13 NOTE — Assessment & Plan Note (Signed)
Chemotherapy plan as listed

## 2023-07-13 NOTE — Progress Notes (Signed)
Hematology/Oncology Progress note [phone] [phone]      CHIEF COMPLAINTS/REASON FOR VISIT:  Follow up for recurrent rectal cancer treatment  ASSESSMENT & PLAN:   Cancer Staging  Rectal cancer [facility]) Staging form: Colon and Rectum, AJCC 8th Edition - Clinical stage from 02/08/2021: Stage IIIB (cT4a, cN1, cM0) - Signed by [name], MD on 03/06/2021   Rectal cancer [facility]) locally advanced rectal cancer-Stage IIIB, s/p TNT neoadjuvant protocol, concurrent Xeloda and radiation-Finished 08/27/2021, s/p  APR,rectal adenocarcinoma, ypT3N0. Positive radial margin due to perforation. Biopsy proven recurrent rectal cancer, locally advanced disease, possible colovesical fistula; no distant metastatic disease on PET- per [name] Surgeon Dr. Luciano Cutter, recurrence is not resectable. --> June 2024 S/p concurrent Xeloda 825 mg/m2 twice daily  with radiation --> July 2024 MRI pelvis w wo contrast showed persistent disease. Disease is not resectable per colorectal surgeon --> FOLFIRI Panitumumab  Labs are reviewed and discussed with patient. Proceed with FOLFIRI + Panitumumab Repeat imaging after 6 cycles.   Acneiform dermatitis Due to chemotherapy.  Recommend clinamycin gel Topical BID PRN  Hypokalemia Recommend to increase potassium BID.  IV potassium chloride x 1 today  Encounter for antineoplastic chemotherapy Chemotherapy plan as listed  Lung nodule Previously discussed with IR, CT-guided biopsy is difficult due to the position of small size. Negative on PET evaluation.  Attention on follow-up CT scans.      No orders of the defined types were placed in this encounter.    Follow-up  2 weeks All questions were answered. The patient knows to call the clinic with any problems, questions or concerns.  [name], MD, PhD [facility] Hematology Oncology 07/13/2023        HISTORY OF PRESENTING ILLNESS:   Barry [name]. is a  57 y.o.  male  presents for rectal cancer Oncology History  Rectal cancer (HCC)  02/08/2021 Initial Diagnosis   Rectal cancer   02/05/2021-02/06/2021 patient was hospitalized due to generalized weakness, intermittent lightheadedness, weight loss and worsening constipation.  02/05/2021 CT abdomen showed concerning of severe rectal wall thickening and right internal iliac lymph node concerning for metastatic disease.  Patient was seen by gastroenterology and had colonoscopy which showed a circumferential fungating mass in the rectum.  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   02/14/2021-02/16/2021 hospitalized due to rectal bleeding and pain.   02/14/2021 CT showed perirectal fluid collection/gas concerning for infection with significant leukocytosis, anemia with hemoglobin of 6.8.  Patient received PRBC transfusion, IV antibiotics.  He underwent an IR guided placement of JP drain into the rectal abscess.  Discharged home with oral Augmentin. 02/20/2021 presented to ER with new skin opening and draining from left gluteus.  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  JP drain has been removed.  Patient was continued on Augmentin.   02/13/2021, PET scan showed locally advanced rectal cancer with billowing of the mesorectum now with low attenuation material that was not present on previous examination with extensive stranding and inflammation.   Bulky RIGHT pelvic sidewall/hypogastric lymph node outside of the mesorectum with stippled calcification measuring 19 mm-no increased metabolic activity. Small LEFT hypogastric lymph node just peripheral to the internal external bifurcation-SUV 3.2 High RIGHT internal just below or at the internal/common iliac transition, lymph node -SUV 4.8 LEFT high hypogastric lymph node  8 mm-SUV 3. Scattered lymph nodes throughout the retroperitoneum with low FDG uptake Bilateral inguinal lymph nodes largest on the RIGHT (image 248/3) 11 mm with a maximum SUV of  2.8  spiculated nodule in the LEFT upper lobe- 9 x 8 mm       02/14/2021, CT abdomen pelvis without contrast Showed perforated rectal mass with contained perforation with fluid and gas extending above and below the pelvic floor,potentially involving the sphincter complex and extending into LEFT ischial rectal fossa   02/20/2021, CT pelvis with contrast showed Large perirectal/perianal abscess has markedly decreased in size since placement of the percutaneous drain. There are residual gas-filled collections in the soft tissues and suspect there is a fistula or sinus tract between the rectal mass and the subcutaneous tissues. Soft tissue gas along the medial left buttock and concern for a cutaneous ulceration in this area. Large rectal mass with evidence for a large necrotic right pelvic lymph node.   His case is complicated with a perirectal abscess. Prior to onset of perirectal abscess, on his 02/05/2021 scan, he was noted to have right internal iliac lymph node 3 x 1 x 2.3 cm which is concerning for nodal disease.On Subsequent images it was difficult to distinguish whether lymphadenopathy was due to nodal disease versus acute inflammation. 03/07/2021 MRI pelvis cT3 N2. Due to the possible contained perforation on previous CT, possible cT4 disease.    02/27/2021 medi port placed by Dr.Dew 03/13/2021 reports right butt cheek and also left perianal area fullness.he was seen by Dr.Pabon urgently  and had right buttock abscess drained. Left perianal fullness was felt to be due to cancer.    02/08/2021 Cancer Staging   Staging form: Colon and Rectum, AJCC 8th Edition - Clinical stage from 02/08/2021: Stage IIIB (cT4a, cN1, cM0) - Signed by [name], MD on 03/06/2021 Stage prefix: Initial diagnosis   02/27/2021 Procedure   medi port placed by Dr.Dew   03/18/2021 - 07/05/2021 Chemotherapy    FOLFOX q14d x 4 months      07/17/2021 - 08/27/2021 Chemotherapy   Xeloda concurrent with Radiation   11/18/2021 Surgery    patient is status post open APR with flap for rectal cancer. Pathology rectal adenomacarcinoma ypT3N0.  Positive margin: Radial (circumferential) or mesenteric: adjacent to perforation      01/27/2022 - 02/02/2022 Hospital Admission   Hospitalized at [facility] due to recurrent abscess.  US guided drain into the pelvic abscess with return of 60 ml of purulent fluid. Cultures positive for staph aureus and strep agalactiae group b, fungal cultures negative.    02/25/2022 Imaging   CT chest angiogram with and without contrast, CT abdomen pelvis with and without contrast 1. Negative for acute pulmonary embolus.2. Emphysema. Further decrease in size of the previously noted irregular nodule in the left upper lobe which is now barely measurable today. No new suspicious lung nodules 3. Status post left lower quadrant colostomy. Further decrease in size since comparison exam from May of the rim enhancing gas and fluid collection at the pelvic surgical bed extending from the perineum superiorly into the pelvis 4. Slightly thickened appearance of terminal ileal small bowel loops in the pelvis with mild stranding suggesting small bowel inflammatory process. 5. Stable enlarged right pelvic sidewall lymph node   06/02/2022 Imaging   CT chest abdomen pelvis w contrast 1. Status post abdominal perineal resection with descending colostomy. 2. At the site of pelvic fluid and gas collection on 02/25/2022, there is residual, decreased presacral soft tissue fullness, without drainable collection. 3. Similar right obturator nodal metastasis.4.  No acute process or evidence of metastatic disease in the chest. 5. Age advanced coronary artery atherosclerosis. Recommend assessment of coronary  risk factors. 6. Aortic atherosclerosis and emphysema     06/26/2022 Imaging   CT abdomen pelvis with contrast showed 1. Interval increase in size of a lobulated partially imaged at least 7.8 x 3.8 x 12 cm abscess in the perianal  region in a patient status post abdominal perineal resection and left lower lobe end colostomy formation. Finding extends to involve the pre sacral region up to the urinary dome level. Associated posterior urinary bladder wall thickening with lack of intraperitoneal fat plane between the presacral soft tissue thickening/abscess formation suggestive of possible fistulization and invasion of the posterior bladder wall. Underlying recurrent malignancy is not excluded. 2. Stable 2.7 cm right pelvic sidewall lymph node. 3.  Aortic Atherosclerosis   06/26/2022 - 06/27/2022 Hospital Admission   He went to [facility] ER and CT showed recurrent abscess. He was transferred to [facility] due to recurrent abscess, treated with IV vanc/zosyn, IR was consulted and drainage tube was placed. Wound grew up Strep viridans. Patient was seen by ID at [facility] discharged with Augmentin for 2 weeks.   He was seen by wound care Dr> Kandace Blitz on 07/09/2022, JP drain was removed.    09/29/2022 Imaging   MRI pelvis without contrast showed  Interval abdominoperitoneal resection since prior MRI. Decreased small fluid collection in the surgical bed compared with more recent CT, consistent with resolving postoperative fluid collection or abscess.   Bulky rounded presacral mass, which shows diffuse restricted diffusion, without significant change in size since most recent CT of 06/26/2022. This raises suspicion for recurrent carcinoma over post treatment changes. Recommend correlation with CEA level, and consider tissue sampling or PET-CT.   Stable enlarged right pelvic sidewall lymph node. No new or increased adenopathy identified.   10/09/2022 Imaging   CT chest with contrast showed 1. Unchanged 0.4 cm fissural nodule of the anterior left lower lobe. This is almost certainly a benign fissural lymph node. No new or suspicious pulmonary nodules. 2. Moderate emphysema and diffuse bilateral bronchial wall thickening. 3. Coronary artery  disease.   10/13/2022 Imaging   CT abdomen pelvis with contrast showed 1. Complex collection persists in the lower pelvis, extending from the anal verge upwards into the presacral space and anteriorly from the presacral space to the bladder dome, not significantly changed in size or extent compared to the earlier CT of 06/26/2022. This is most likely a combination of a postoperative seroma and/or chronic phlegmon/abscess versus recurrent abscess. 2. Bladder walls are thick walled/edematous. Given the contiguity of the presacral collection and the bladder dome, this is highly suspicious for a related bladder wall infection and possibly secondary to colovesical fistula. Recommend correlation with urinalysis. 3. No evidence of bowel obstruction. LEFT lower abdominal wall colostomy, without obstruction or inflammatory change. 4. No free intraperitoneal air   10/13/2022 [facility] Admission   Hospitalized due to fever and rectal pain. Urine culture showed mixed urogenital flora. IR CT guided Aspiration of abscess showed Streptococcal Viridans. He was placed back on Augmentin  10/14/2023 CT read by [facility] radiology Postsurgical changes of abdominoperineal resection. Interval enlargement of  fluid collection in the surgical bed. Multiple foci of enhancing tissue at  the margins of this fluid collection, increased from prior study, suspicious for local recurrence.   Prominent retroperitoneal lymph nodes, some which are increased in size from prior study. These are indeterminate and may be either reactive or represent metastatic disease. Recommend continued attention on follow-up.   Centrally hypoattenuating right obturator lymph node, unchanged in size, consistent with  treated disease.   Circumferential bladder wall thickening and irregularity, most pronounced on the left side. This may be reactive in the setting of adjacent pelvic inflammatory changes. Correlate with urinalysis if there is clinical  concern for  urinary tract infection.    11/14/2022 Imaging   MRI pelvis w wo contrast  1. Status post abdominoperineal resection with left lower quadrant end colostomy. 2. Compared to prior CT and MR, no significant change in appearance of heterogeneous, rim enhancing presacral and low pelvic soft tissue and fluid. Largest heterogeneously enhancing conglomerate of fluid and soft tissue appears to closely involve the anteriorly abutting the seminal vesicles and measures 3.0 x 2.9 cm in largest axial dimension. This extends to superiorly contact the posterior bladder dome and inferiorly towards the gluteal cleft. 3. Discrete fluid component at the most inferior extent measuring 2.4 x 1.3 cm. This is markedly diminished in volume compared to more remote previous examinations, for example 06/26/2022. 4. Constellation of findings is highly concerning for locally recurrent rectal malignancy with or without superimposed infection. Presence or absence of infection at this time is not established by MR. 5. Additional unchanged hemorrhagic or proteinaceous fluid collection in the right hemipelvis measuring 3.2 x 2.6 cm most consistent with postoperative hematoma or seroma. 6. Unchanged thickening of the urinary bladder wall. As previously reported, fistula or involvement of bladder wall by malignancy not excluded.   12/09/2022 Procedure   CT guided biopsy of the pelvic mass showed Moderately differentiated adenocarcinoma with dirty necrosis, morphologically identical to patient's prior rectal adenocarcinoma.   [facility] NGS xT 648 panel showed MLH3 start loss,  BCORL1 frame shift, TP53 splice region varian, APC stop gain,  TMB 7.9, MS stable, KRAS/BRAF/NRAS negative.   [facility] NGS xR showed no gene arrangement or reportable altered splicing events in RNA sequencing      01/07/2023 -  Chemotherapy   Xeloda 825 mg/m2 twice daily  with radiation   02/11/2023 Imaging   CT abdomen pelvis w contrast  1. Examination is positive  for small bowel obstruction. Transition point is identified within the left iliac fossa. Distal to the transition point the small bowel loops exhibit mucosal enhancement and wall thickening concerning for enteritis. 2. There is new right-sided hydronephrosis and hydroureter up to the level of the bifurcation of right common iliac artery. The distal right ureter appears closely associated with the previously characterized tracer avid presacral soft tissue mass. Cannot exclude obstructive uropathy secondary to tumor involvement. 3. Similar appearance of presacral soft tissue mass. This was tracer avid on the recent PET-CT from 12/17/2022, and concerning for locally recurrent tumor. 4. Unchanged diffuse circumferential wall thickening involving the urinary bladder. 5. Aortic Atherosclerosis    04/17/2023 -  Chemotherapy   Patient is on Treatment Plan : COLORECTAL FOLFIRI + Panitumumab q14d       Patient presented to emergency room on 12/08/21, rectal pain.  CT scan showed 18.3 cm fluid and gas collection in the pelvic surgical bed compatible with infected collection/abscess.  Patient was sent to [facility] and admitted for.  Patient has CT-guided drain placement on 12/09/2021.  Blood culture positive for Staph epidermidis which was felt to be contaminant.  Fluid culture grew pansensitive Staph aureus, strep pyogenes and candida albicans.  Patient was treated with IV antibiotics and transition to p.o. Augmentin and fluconazole on 12/11/2021.  #Perirectal abscess status post drainage catheter , patient was recently seen by [facility] oncology surgeon on 12/24/2021 and was recommended to keep drainage catheter and continue antibiotics.  Patient had a repeat CT scan done which showed a smaller but persistent abscess.  He will return to [facility] surgery for follow-up of drain removal. His case was discussed on [name] tumor board for the positive mesenteric margin which was felt to be secondary to previous perforation.  Recommend  surveillance.  02/11/23 patient was admitted due to nausea, CT showed small bowel obstruction, severe constipation, NG tube was placed. His symptoms improved and has ostomy output, NG was removed and he tolerated PO.   During the interval ER visit due to abdominal pain.  03/02/23 CT abdomen pelvis w contrast showed . Findings are again suggestive of probable partial small bowel obstruction which appears likely related to enteritis. Specifically, there are multiple areas of mural thickening throughout the distal small bowel with increased mucosal and mural enhancement, with some very mild proximal small bowel dilatation. 2. Status post APR with left lower quadrant colostomy and large soft tissue mass in the low anatomic pelvis which is slightly more prominent than the recent prior study, and was previously hypermetabolic on prior PET-CT 12/17/2022, suggesting residual disease. 3. Probable nodal mass along the right pelvic sidewall in the right internal iliac nodal distribution, similar to the prior study, previously not hypermetabolic on prior PET-CT. No other lymphadenopathy or definitive signs of metastatic disease noted elsewhere on today's examination.  4. Aortic atherosclerosis, as well as calcified atherosclerotic plaque in the right coronary artery. Please note that although the presence of coronary artery calcium documents the presence of coronary artery disease, the severity of this disease and any potential stenosis cannot be assessed on this non-gated CT examination. Assessment for potential risk factor modification, dietary therapy or pharmacologic therapy may be warranted, if clinically indicated. 5. Additional incidental findings, as above. Patient has radiation induced enteritis. His symptoms were mild he was discharged home.   INTERVAL HISTORY Zarif Rathje. is a 57 y.o. male who has above history reviewed by me today presents for follow up visit for management of recurrent locally  advanced rectal cancer.  He feels  good today, good appetite, more tired.  Denies fever chills, rectal pain. Denies rectal discharge.  Denies nausea vomiting or abdominal pain.      Review of Systems  Constitutional:  Positive for fatigue. Negative for appetite change, chills, fever and unexpected weight change.  HENT:   Negative for hearing loss and voice change.   Eyes:  Negative for eye problems and icterus.  Respiratory:  Negative for chest tightness, cough and shortness of breath.   Cardiovascular:  Negative for chest pain and leg swelling.  Gastrointestinal:  Negative for abdominal distention, abdominal pain, blood in stool, constipation, diarrhea and rectal pain.  Endocrine: Negative for hot flashes.  Genitourinary:  Negative for difficulty urinating, dysuria and frequency.   Musculoskeletal:  Negative for arthralgias.  Skin:  Positive for rash. Negative for itching.  Neurological:  Negative for light-headedness and numbness.  Hematological:  Negative for adenopathy. Does not bruise/bleed easily.  Psychiatric/Behavioral:  Negative for confusion.     MEDICAL HISTORY:  Past Medical History:  Diagnosis Date   Headache    Left shoulder pain    Rectal cancer (HCC)     SURGICAL HISTORY: Past Surgical History:  Procedure Laterality Date   COLON SURGERY     COLONOSCOPY WITH PROPOFOL N/A 02/06/2021   Procedure: COLONOSCOPY WITH PROPOFOL;  Surgeon: Toney Reil, MD;  Location: [facility] ENDOSCOPY;  Service: Gastroenterology;  Laterality: N/A;   PORTA CATH INSERTION N/A 02/27/2021  Procedure: PORTA CATH INSERTION;  Surgeon: Annice Needy, MD;  Location: [facility] INVASIVE CV LAB;  Service: Cardiovascular;  Laterality: N/A;   ROTATOR CUFF REPAIR Left     SOCIAL HISTORY: Social History   Socioeconomic History   Marital status: Married    Spouse name: Not on file   Number of children: Not on file   Years of education: Not on file   Highest education level: Not on file   Occupational History   Not on file  Tobacco Use   Smoking status: Former    Current packs/day: 0.00    Average packs/day: 1 pack/day for 10.0 years (10.0 ttl pk-yrs)    Types: Cigars, Cigarettes    Start date: 02/05/2011    Quit date: 02/04/2021    Years since quitting: 2.4   Smokeless tobacco: Never  Substance and Sexual Activity   Alcohol use: Not Currently   Drug use: No   Sexual activity: Not on file  Other Topics Concern   Not on file  Social History Narrative   Not on file   Social Determinants of Health   Financial Resource Strain: High Risk (02/13/2023)   Received from [facility], [facility]   Overall Financial Resource Strain (CARDIA)    Difficulty of Paying Living Expenses: Hard  Food Insecurity: No Food Insecurity (02/13/2023)   Received from [facility], [facility]   Hunger Vital Sign    Worried About Running Out of Food in the Last Year: Never true    Ran Out of Food in the Last Year: Never true  Transportation Needs: No Transportation Needs (02/13/2023)   Received from [facility], [facility]   [facility] - Transportation    In the past 12 months, has lack of transportation kept you from medical appointments or from getting medications?: No    Lack of Transportation (Non-Medical): No  Physical Activity: Inactive (11/03/2022)   Exercise Vital Sign    Days of Exercise per Week: 0 days    Minutes of Exercise per Session: 0 min  Stress: Stress Concern Present (11/03/2022)   [employer] of Occupational Health - Occupational Stress Questionnaire    Feeling of Stress : Rather much  Social Connections: Moderately Isolated (11/03/2022)   Social Connection and Isolation Panel [NHANES]    Frequency of Communication with Friends and Family: Twice a week    Frequency of Social Gatherings with Friends and Family: Three times a week    Attends Religious Services: 1  to 4 times per year    Active Member of Clubs or Organizations: No    Attends Banker Meetings: Never    Marital Status: Separated  Intimate Partner Violence: Not At Risk (11/03/2022)   Humiliation, Afraid, Rape, and Kick questionnaire    Fear of Current or Ex-Partner: No    Emotionally Abused: No    Physically Abused: No    Sexually Abused: No    FAMILY HISTORY: Family History  Problem Relation Age of Onset   Cancer Sister    Diabetes Mother    Cancer Maternal Grandmother    Cancer Paternal Grandmother     ALLERGIES:  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  MEDICATIONS:  Current Outpatient Medications  Medication Sig Dispense Refill   calcium-vitamin D (OSCAL WITH D) 500-5 MG-MCG tablet Take 2 tablets by mouth daily. 60 tablet 3   citalopram (CELEXA) 10 MG tablet Take 1 tablet (10 mg total) by  mouth daily. 30 tablet 3   clindamycin (CLINDAGEL) 1 % gel Apply topically 2 (two) times daily. 30 g 4   lidocaine-prilocaine (EMLA) cream Apply 1 Application topically as needed. Apply small amount to port and cover with saran wrap 1-2 hours prior to port access 30 g 2   loperamide (IMODIUM) 2 MG capsule Take 2 capsules (4mg ) by mouth initially. Then take 1 capsule by mouth every 2 hours (4mg  every 4 hours at night). Take no more than 16mg /day 90 capsule 2   ondansetron (ZOFRAN) 8 MG tablet Take 1 tablet (8 mg total) by mouth every 8 (eight) hours as needed for nausea or vomiting. 90 tablet 1   prochlorperazine (COMPAZINE) 10 MG tablet Take 1 tablet (10 mg total) by mouth every 6 (six) hours as needed for nausea or vomiting. 90 tablet 1   nicotine (NICODERM CQ - DOSED IN MG/24 HR) 7 mg/24hr patch Place 1 patch (7 mg total) onto the skin daily. (Patient not taking: Reported on 06/01/2023) 28 patch 2   oxyCODONE (ROXICODONE) 5 MG immediate release tablet Take 1-2 tablets (5-10 mg total) by mouth every 6 (six) hours as needed for severe pain. (Patient not taking: Reported on 07/13/2023) 90  tablet 0   potassium chloride SA (KLOR-CON M) 20 MEQ tablet Take 1 tablet (20 mEq total) by mouth 2 (two) times daily. 60 tablet 2   senna (SENOKOT) 8.6 MG TABS tablet Take 2 tablets (17.2 mg total) by mouth daily. (Patient not taking: Reported on 06/01/2023) 100 tablet 0   traZODone (DESYREL) 50 MG tablet Take 1 tablet (50 mg total) by mouth at bedtime as needed for sleep. (Patient not taking: Reported on 04/28/2023) 30 tablet 3   No current facility-administered medications for this visit.   Facility-Administered Medications Ordered in Other Visits  Medication Dose Route Frequency Provider Last Rate Last Admin   fluorouracil (ADRUCIL) 5,000 mg in sodium chloride 0.9 % 150 mL chemo infusion  2,400 mg/m2 (Order-Specific) Intravenous 1 day or 1 dose [name], MD       fluorouracil (ADRUCIL) chemo injection 750 mg  400 mg/m2 (Order-Specific) Intravenous Once [name], MD       heparin lock flush 100 unit/mL  500 Units Intravenous Once [name], MD       irinotecan (CAMPTOSAR) 340 mg in sodium chloride 0.9 % 500 mL chemo infusion  180 mg/m2 (Order-Specific) Intravenous Once [name], MD       leucovorin 760 mg in sodium chloride 0.9 % 250 mL infusion  400 mg/m2 (Order-Specific) Intravenous Once [name], MD       panitumumab (VECTIBIX) 500 mg in sodium chloride 0.9 % 100 mL chemo infusion  6 mg/kg (Order-Specific) Intravenous Once [name], MD       sodium chloride flush (NS) 0.9 % injection 10 mL  10 mL Intracatheter PRN [name], MD   10 mL at 05/06/23 1246     PHYSICAL EXAMINATION: ECOG PERFORMANCE STATUS: 1 - Symptomatic but completely ambulatory Vitals:   07/13/23 0828  BP: (!) 126/90  Pulse: (!) 107  Resp: 18  Temp: (!) 97 F (36.1 C)  SpO2: 100%     Filed Weights   07/13/23 0828  Weight: 172 lb 9.6 oz (78.3 kg)      Physical Exam HENT:     Head: Normocephalic and atraumatic.  Eyes:     General: No scleral icterus. Cardiovascular:     Rate and Rhythm: Normal rate and  regular rhythm.  Heart sounds: Normal heart sounds.  Pulmonary:     Effort: Pulmonary effort is normal. No respiratory distress.     Breath sounds: No wheezing.     Comments: Decreased breath sound bilaterally.  Abdominal:     General: Bowel sounds are normal. There is no distension.     Palpations: Abdomen is soft.     Comments: Colostomy bag  Genitourinary:    Comments: Status post APR, Musculoskeletal:        General: No deformity. Normal range of motion.     Cervical back: Normal range of motion and neck supple.  Skin:    General: Skin is warm and dry.     Findings: No erythema.     Comments: Acne on face.   Neurological:     Mental Status: He is alert and oriented to person, place, and time. Mental status is at baseline.     Cranial Nerves: No cranial nerve deficit.  Psychiatric:        Mood and Affect: Mood normal.     LABORATORY DATA:  I have reviewed the data as listed    Latest Ref Rng & Units 07/13/2023    7:54 AM 06/29/2023    8:11 AM 06/15/2023    8:45 AM  CBC  WBC 4.0 - 10.5 K/uL 4.3  3.9  3.4   Hemoglobin 13.0 - 17.0 g/dL 29.5  28.4  13.2   Hematocrit 39.0 - 52.0 % 39.8  39.2  39.4   Platelets 150 - 400 K/uL 242  182  161       Latest Ref Rng & Units 07/13/2023    7:54 AM 06/29/2023    8:11 AM 06/15/2023    8:45 AM  CMP  Glucose 70 - 99 mg/dL 440  102  725   BUN 6 - 20 mg/dL 9  9  9    Creatinine 0.61 - 1.24 mg/dL 3.66  4.40  3.47   Sodium 135 - 145 mmol/L 135  136  136   Potassium 3.5 - 5.1 mmol/L 3.9  2.9  3.2   Chloride 98 - 111 mmol/L 103  103  106   CO2 22 - 32 mmol/L 24  25  22    Calcium 8.9 - 10.3 mg/dL 9.0  8.4  8.4   Total Protein 6.5 - 8.1 g/dL 7.4  7.1  6.9   Total Bilirubin <1.2 mg/dL 0.4  0.4  0.5   Alkaline Phos 38 - 126 U/L 90  97  83   AST 15 - 41 U/L 17  22  21    ALT 0 - 44 U/L 14  16  16       RADIOGRAPHIC STUDIES: I have personally reviewed the radiological images as listed and agreed with the findings in the report. No  results found.

## 2023-07-13 NOTE — Assessment & Plan Note (Signed)
Recommend to increase potassium BID.  IV potassium chloride x 1 today

## 2023-07-15 ENCOUNTER — Inpatient Hospital Stay: Payer: Medicare Other

## 2023-07-15 VITALS — BP 130/75 | HR 108 | Temp 95.0°F | Resp 19

## 2023-07-15 DIAGNOSIS — E876 Hypokalemia: Secondary | ICD-10-CM | POA: Diagnosis not present

## 2023-07-15 DIAGNOSIS — Z5111 Encounter for antineoplastic chemotherapy: Secondary | ICD-10-CM | POA: Diagnosis not present

## 2023-07-15 DIAGNOSIS — C20 Malignant neoplasm of rectum: Secondary | ICD-10-CM | POA: Diagnosis not present

## 2023-07-15 DIAGNOSIS — Z933 Colostomy status: Secondary | ICD-10-CM | POA: Diagnosis not present

## 2023-07-15 DIAGNOSIS — Z5112 Encounter for antineoplastic immunotherapy: Secondary | ICD-10-CM | POA: Diagnosis not present

## 2023-07-15 DIAGNOSIS — Z923 Personal history of irradiation: Secondary | ICD-10-CM | POA: Diagnosis not present

## 2023-07-15 DIAGNOSIS — L308 Other specified dermatitis: Secondary | ICD-10-CM | POA: Diagnosis not present

## 2023-07-15 DIAGNOSIS — R911 Solitary pulmonary nodule: Secondary | ICD-10-CM | POA: Diagnosis not present

## 2023-07-15 MED ORDER — HEPARIN SOD (PORK) LOCK FLUSH 100 UNIT/ML IV SOLN
500.0000 [IU] | Freq: Once | INTRAVENOUS | Status: AC | PRN
  Administered 2023-07-15: 500 [IU]
  Filled 2023-07-15: qty 5

## 2023-07-21 DIAGNOSIS — C20 Malignant neoplasm of rectum: Secondary | ICD-10-CM | POA: Diagnosis not present

## 2023-07-23 ENCOUNTER — Other Ambulatory Visit: Payer: Self-pay

## 2023-07-27 ENCOUNTER — Encounter: Payer: Self-pay | Admitting: Oncology

## 2023-07-27 ENCOUNTER — Inpatient Hospital Stay: Payer: Medicare Other

## 2023-07-27 ENCOUNTER — Other Ambulatory Visit: Payer: Self-pay

## 2023-07-27 ENCOUNTER — Ambulatory Visit: Payer: 59 | Admitting: Oncology

## 2023-07-27 ENCOUNTER — Ambulatory Visit: Payer: 59

## 2023-07-27 ENCOUNTER — Inpatient Hospital Stay (HOSPITAL_BASED_OUTPATIENT_CLINIC_OR_DEPARTMENT_OTHER): Payer: Medicare Other | Admitting: Oncology

## 2023-07-27 ENCOUNTER — Other Ambulatory Visit: Payer: 59

## 2023-07-27 VITALS — BP 133/97 | HR 89 | Temp 97.6°F | Resp 18 | Wt 173.6 lb

## 2023-07-27 DIAGNOSIS — L308 Other specified dermatitis: Secondary | ICD-10-CM | POA: Diagnosis not present

## 2023-07-27 DIAGNOSIS — Z5111 Encounter for antineoplastic chemotherapy: Secondary | ICD-10-CM | POA: Diagnosis not present

## 2023-07-27 DIAGNOSIS — L708 Other acne: Secondary | ICD-10-CM

## 2023-07-27 DIAGNOSIS — E876 Hypokalemia: Secondary | ICD-10-CM

## 2023-07-27 DIAGNOSIS — Z933 Colostomy status: Secondary | ICD-10-CM | POA: Diagnosis not present

## 2023-07-27 DIAGNOSIS — C20 Malignant neoplasm of rectum: Secondary | ICD-10-CM

## 2023-07-27 DIAGNOSIS — Z923 Personal history of irradiation: Secondary | ICD-10-CM | POA: Diagnosis not present

## 2023-07-27 DIAGNOSIS — Z5112 Encounter for antineoplastic immunotherapy: Secondary | ICD-10-CM | POA: Diagnosis not present

## 2023-07-27 DIAGNOSIS — R911 Solitary pulmonary nodule: Secondary | ICD-10-CM | POA: Diagnosis not present

## 2023-07-27 LAB — CMP (CANCER CENTER ONLY)
ALT: 15 U/L (ref 0–44)
AST: 20 U/L (ref 15–41)
Albumin: 3.3 g/dL — ABNORMAL LOW (ref 3.5–5.0)
Alkaline Phosphatase: 97 U/L (ref 38–126)
Anion gap: 8 (ref 5–15)
BUN: 8 mg/dL (ref 6–20)
CO2: 25 mmol/L (ref 22–32)
Calcium: 8.2 mg/dL — ABNORMAL LOW (ref 8.9–10.3)
Chloride: 107 mmol/L (ref 98–111)
Creatinine: 1.13 mg/dL (ref 0.61–1.24)
GFR, Estimated: >60 mL/min (ref >60)
Glucose, Bld: 164 mg/dL — ABNORMAL HIGH (ref 70–99)
Potassium: 3.3 mmol/L — ABNORMAL LOW (ref 3.5–5.1)
Sodium: 140 mmol/L (ref 135–145)
Total Bilirubin: 0.5 mg/dL (ref <1.2)
Total Protein: 6.6 g/dL (ref 6.5–8.1)

## 2023-07-27 LAB — CBC WITH DIFFERENTIAL (CANCER CENTER ONLY)
Abs Immature Granulocytes: 0.01 10*3/uL (ref 0.00–0.07)
Basophils Absolute: 0 10*3/uL (ref 0.0–0.1)
Basophils Relative: 1 %
Eosinophils Absolute: 0.1 10*3/uL (ref 0.0–0.5)
Eosinophils Relative: 3 %
HCT: 36 % — ABNORMAL LOW (ref 39.0–52.0)
Hemoglobin: 12.6 g/dL — ABNORMAL LOW (ref 13.0–17.0)
Immature Granulocytes: 0 %
Lymphocytes Relative: 25 %
Lymphs Abs: 0.8 10*3/uL (ref 0.7–4.0)
MCH: 33.1 pg (ref 26.0–34.0)
MCHC: 35 g/dL (ref 30.0–36.0)
MCV: 94.5 fL (ref 80.0–100.0)
Monocytes Absolute: 0.7 10*3/uL (ref 0.1–1.0)
Monocytes Relative: 22 %
Neutro Abs: 1.6 10*3/uL — ABNORMAL LOW (ref 1.7–7.7)
Neutrophils Relative %: 49 %
Platelet Count: 184 10*3/uL (ref 150–400)
RBC: 3.81 MIL/uL — ABNORMAL LOW (ref 4.22–5.81)
RDW: 15.7 % — ABNORMAL HIGH (ref 11.5–15.5)
WBC Count: 3.3 10*3/uL — ABNORMAL LOW (ref 4.0–10.5)
nRBC: 0.6 % — ABNORMAL HIGH (ref 0.0–0.2)

## 2023-07-27 LAB — MAGNESIUM: Magnesium: 1.6 mg/dL — ABNORMAL LOW (ref 1.7–2.4)

## 2023-07-27 MED ORDER — DEXAMETHASONE SODIUM PHOSPHATE 10 MG/ML IJ SOLN
10.0000 mg | Freq: Once | INTRAMUSCULAR | Status: AC
  Administered 2023-07-27: 10 mg via INTRAVENOUS
  Filled 2023-07-27: qty 1

## 2023-07-27 MED ORDER — ATROPINE SULFATE 1 MG/ML IV SOLN
0.5000 mg | Freq: Once | INTRAVENOUS | Status: AC | PRN
  Administered 2023-07-27: 0.5 mg via INTRAVENOUS
  Filled 2023-07-27: qty 1

## 2023-07-27 MED ORDER — PALONOSETRON HCL INJECTION 0.25 MG/5ML
0.2500 mg | Freq: Once | INTRAVENOUS | Status: AC
  Administered 2023-07-27: 0.25 mg via INTRAVENOUS
  Filled 2023-07-27: qty 5

## 2023-07-27 MED ORDER — POTASSIUM CHLORIDE CRYS ER 20 MEQ PO TBCR
20.0000 meq | EXTENDED_RELEASE_TABLET | ORAL | 2 refills | Status: DC
  Filled 2023-07-27: qty 60, 20d supply, fill #0
  Filled 2023-08-10: qty 60, 30d supply, fill #0
  Filled 2023-08-10: qty 60, 20d supply, fill #0

## 2023-07-27 MED ORDER — SODIUM CHLORIDE 0.9 % IV SOLN
400.0000 mg/m2 | Freq: Once | INTRAVENOUS | Status: AC
  Administered 2023-07-27: 760 mg via INTRAVENOUS
  Filled 2023-07-27: qty 38

## 2023-07-27 MED ORDER — FLUOROURACIL CHEMO INJECTION 5 GM/100ML
2400.0000 mg/m2 | INTRAVENOUS | Status: DC
  Administered 2023-07-27: 5000 mg via INTRAVENOUS
  Filled 2023-07-27: qty 100

## 2023-07-27 MED ORDER — SODIUM CHLORIDE 0.9 % IV SOLN
180.0000 mg/m2 | Freq: Once | INTRAVENOUS | Status: AC
  Administered 2023-07-27: 340 mg via INTRAVENOUS
  Filled 2023-07-27: qty 17

## 2023-07-27 MED ORDER — FLUOROURACIL CHEMO INJECTION 2.5 GM/50ML
400.0000 mg/m2 | Freq: Once | INTRAVENOUS | Status: AC
  Administered 2023-07-27: 750 mg via INTRAVENOUS
  Filled 2023-07-27: qty 15

## 2023-07-27 MED ORDER — MAGNESIUM CHLORIDE 64 MG PO TBEC
1.0000 | DELAYED_RELEASE_TABLET | Freq: Every day | ORAL | 1 refills | Status: DC
  Filled 2023-07-27: qty 30, 30d supply, fill #0

## 2023-07-27 MED ORDER — PANITUMUMAB CHEMO INJECTION 100 MG/5ML
6.0000 mg/kg | Freq: Once | INTRAVENOUS | Status: AC
  Administered 2023-07-27: 500 mg via INTRAVENOUS
  Filled 2023-07-27: qty 5

## 2023-07-27 MED ORDER — SODIUM CHLORIDE 0.9 % IV SOLN
Freq: Once | INTRAVENOUS | Status: AC
  Filled 2023-07-27: qty 250

## 2023-07-27 MED ORDER — MAGNESIUM SULFATE 2 GM/50ML IV SOLN
2.0000 g | Freq: Once | INTRAVENOUS | Status: AC
Start: 2023-07-27 — End: 2023-07-27
  Administered 2023-07-27: 2 g via INTRAVENOUS
  Filled 2023-07-27: qty 50

## 2023-07-27 NOTE — Progress Notes (Signed)
Hematology/Oncology Progress note [phone] [phone]      CHIEF COMPLAINTS/REASON FOR VISIT:  Follow up for recurrent rectal cancer treatment  ASSESSMENT & PLAN:   Cancer Staging  Rectal cancer [facility]) Staging form: Colon and Rectum, AJCC 8th Edition - Clinical stage from 02/08/2021: Stage IIIB (cT4a, cN1, cM0) - Signed by [name], MD on 03/06/2021   Rectal cancer [facility]) locally advanced rectal cancer-Stage IIIB, s/p TNT neoadjuvant protocol, concurrent Xeloda and radiation-Finished 08/27/2021, s/p  APR,rectal adenocarcinoma, ypT3N0. Positive radial margin due to perforation. Biopsy proven recurrent rectal cancer, locally advanced disease, possible colovesical fistula; no distant metastatic disease on PET- per [name] Surgeon Dr. Luciano Cutter, recurrence is not resectable. --> June 2024 S/p concurrent Xeloda 825 mg/m2 twice daily  with radiation --> July 2024 MRI pelvis w wo contrast showed persistent disease. Disease is not resectable per colorectal surgeon --> FOLFIRI Panitumumab  Labs are reviewed and discussed with patient. Proceed with FOLFIRI + Panitumumab Repeat imaging after 6 cycles.   Encounter for antineoplastic chemotherapy Chemotherapy plan as listed  Hypokalemia Recommend to increase potassium to in AM and in PM.   Acneiform dermatitis Due to chemotherapy.  Recommend clinamycin gel Topical BID PRN  Hypomagnesemia IV Mag 2g  Recommend patient to start slow Mag 1 tab daily.       No orders of the defined types were placed in this encounter.    Follow-up  2 weeks All questions were answered. The patient knows to call the clinic with any problems, questions or concerns.  [name], MD, PhD [facility] Hematology Oncology 07/27/2023        HISTORY OF PRESENTING ILLNESS:   Barry [name]. is a  57 y.o.  male presents for rectal cancer Oncology History  Rectal cancer (HCC)  02/08/2021 Initial Diagnosis   Rectal cancer    02/05/2021-02/06/2021 patient was hospitalized due to generalized weakness, intermittent lightheadedness, weight loss and worsening constipation.  02/05/2021 CT abdomen showed concerning of severe rectal wall thickening and right internal iliac lymph node concerning for metastatic disease.  Patient was seen by gastroenterology and had colonoscopy which showed a circumferential fungating mass in the rectum.  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   02/14/2021-02/16/2021 hospitalized due to rectal bleeding and pain.   02/14/2021 CT showed perirectal fluid collection/gas concerning for infection with significant leukocytosis, anemia with hemoglobin of 6.8.  Patient received PRBC transfusion, IV antibiotics.  He underwent an IR guided placement of JP drain into the rectal abscess.  Discharged home with oral Augmentin. 02/20/2021 presented to ER with new skin opening and draining from left gluteus.  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  JP drain has been removed.  Patient was continued on Augmentin.   02/13/2021, PET scan showed locally advanced rectal cancer with billowing of the mesorectum now with low attenuation material that was not present on previous examination with extensive stranding and inflammation.   Bulky RIGHT pelvic sidewall/hypogastric lymph node outside of the mesorectum with stippled calcification measuring 19 mm-no increased metabolic activity. Small LEFT hypogastric lymph node just peripheral to the internal external bifurcation-SUV 3.2 High RIGHT internal just below or at the internal/common iliac transition, lymph node -SUV 4.8 LEFT high hypogastric lymph node  8 mm-SUV 3. Scattered lymph nodes throughout the retroperitoneum with low FDG uptake Bilateral inguinal lymph nodes largest on the RIGHT (image 248/3) 11 mm with a maximum SUV of 2.8 spiculated nodule in the LEFT upper lobe- 9 x 8 mm  02/14/2021, CT abdomen pelvis without contrast Showed  perforated rectal mass with contained perforation with fluid and gas extending above and below the pelvic floor,potentially involving the sphincter complex and extending into LEFT ischial rectal fossa   02/20/2021, CT pelvis with contrast showed Large perirectal/perianal abscess has markedly decreased in size since placement of the percutaneous drain. There are residual gas-filled collections in the soft tissues and suspect there is a fistula or sinus tract between the rectal mass and the subcutaneous tissues. Soft tissue gas along the medial left buttock and concern for a cutaneous ulceration in this area. Large rectal mass with evidence for a large necrotic right pelvic lymph node.   His case is complicated with a perirectal abscess. Prior to onset of perirectal abscess, on his 02/05/2021 scan, he was noted to have right internal iliac lymph node 3 x 1 x 2.3 cm which is concerning for nodal disease.On Subsequent images it was difficult to distinguish whether lymphadenopathy was due to nodal disease versus acute inflammation. 03/07/2021 MRI pelvis cT3 N2. Due to the possible contained perforation on previous CT, possible cT4 disease.    02/27/2021 medi port placed by Dr.Dew 03/13/2021 reports right butt cheek and also left perianal area fullness.he was seen by Dr.Pabon urgently  and had right buttock abscess drained. Left perianal fullness was felt to be due to cancer.    02/08/2021 Cancer Staging   Staging form: Colon and Rectum, AJCC 8th Edition - Clinical stage from 02/08/2021: Stage IIIB (cT4a, cN1, cM0) - Signed by [name], MD on 03/06/2021 Stage prefix: Initial diagnosis   02/27/2021 Procedure   medi port placed by Dr.Dew   03/18/2021 - 07/05/2021 Chemotherapy    FOLFOX q14d x 4 months      07/17/2021 - 08/27/2021 Chemotherapy   Xeloda concurrent with Radiation   11/18/2021 Surgery   patient is status post open APR with flap for rectal cancer. Pathology rectal adenomacarcinoma ypT3N0.  Positive  margin: Radial (circumferential) or mesenteric: adjacent to perforation      01/27/2022 - 02/02/2022 Hospital Admission   Hospitalized at [facility] due to recurrent abscess.  US guided drain into the pelvic abscess with return of 60 ml of purulent fluid. Cultures positive for staph aureus and strep agalactiae group b, fungal cultures negative.    02/25/2022 Imaging   CT chest angiogram with and without contrast, CT abdomen pelvis with and without contrast 1. Negative for acute pulmonary embolus.2. Emphysema. Further decrease in size of the previously noted irregular nodule in the left upper lobe which is now barely measurable today. No new suspicious lung nodules 3. Status post left lower quadrant colostomy. Further decrease in size since comparison exam from May of the rim enhancing gas and fluid collection at the pelvic surgical bed extending from the perineum superiorly into the pelvis 4. Slightly thickened appearance of terminal ileal small bowel loops in the pelvis with mild stranding suggesting small bowel inflammatory process. 5. Stable enlarged right pelvic sidewall lymph node   06/02/2022 Imaging   CT chest abdomen pelvis w contrast 1. Status post abdominal perineal resection with descending colostomy. 2. At the site of pelvic fluid and gas collection on 02/25/2022, there is residual, decreased presacral soft tissue fullness, without drainable collection. 3. Similar right obturator nodal metastasis.4.  No acute process or evidence of metastatic disease in the chest. 5. Age advanced coronary artery atherosclerosis. Recommend assessment of coronary risk factors. 6. Aortic atherosclerosis and emphysema     06/26/2022 Imaging   CT abdomen pelvis  with contrast showed 1. Interval increase in size of a lobulated partially imaged at least 7.8 x 3.8 x 12 cm abscess in the perianal region in a patient status post abdominal perineal resection and left lower lobe end colostomy formation. Finding  extends to involve the pre sacral region up to the urinary dome level. Associated posterior urinary bladder wall thickening with lack of intraperitoneal fat plane between the presacral soft tissue thickening/abscess formation suggestive of possible fistulization and invasion of the posterior bladder wall. Underlying recurrent malignancy is not excluded. 2. Stable 2.7 cm right pelvic sidewall lymph node. 3.  Aortic Atherosclerosis   06/26/2022 - 06/27/2022 Hospital Admission   He went to [facility] ER and CT showed recurrent abscess. He was transferred to [facility] due to recurrent abscess, treated with IV vanc/zosyn, IR was consulted and drainage tube was placed. Wound grew up Strep viridans. Patient was seen by ID at [facility] discharged with Augmentin for 2 weeks.   He was seen by wound care Dr> Kandace Blitz on 07/09/2022, JP drain was removed.    09/29/2022 Imaging   MRI pelvis without contrast showed  Interval abdominoperitoneal resection since prior MRI. Decreased small fluid collection in the surgical bed compared with more recent CT, consistent with resolving postoperative fluid collection or abscess.   Bulky rounded presacral mass, which shows diffuse restricted diffusion, without significant change in size since most recent CT of 06/26/2022. This raises suspicion for recurrent carcinoma over post treatment changes. Recommend correlation with CEA level, and consider tissue sampling or PET-CT.   Stable enlarged right pelvic sidewall lymph node. No new or increased adenopathy identified.   10/09/2022 Imaging   CT chest with contrast showed 1. Unchanged 0.4 cm fissural nodule of the anterior left lower lobe. This is almost certainly a benign fissural lymph node. No new or suspicious pulmonary nodules. 2. Moderate emphysema and diffuse bilateral bronchial wall thickening. 3. Coronary artery disease.   10/13/2022 Imaging   CT abdomen pelvis with contrast showed 1. Complex collection persists in the lower  pelvis, extending from the anal verge upwards into the presacral space and anteriorly from the presacral space to the bladder dome, not significantly changed in size or extent compared to the earlier CT of 06/26/2022. This is most likely a combination of a postoperative seroma and/or chronic phlegmon/abscess versus recurrent abscess. 2. Bladder walls are thick walled/edematous. Given the contiguity of the presacral collection and the bladder dome, this is highly suspicious for a related bladder wall infection and possibly secondary to colovesical fistula. Recommend correlation with urinalysis. 3. No evidence of bowel obstruction. LEFT lower abdominal wall colostomy, without obstruction or inflammatory change. 4. No free intraperitoneal air   10/13/2022 [facility] Admission   Hospitalized due to fever and rectal pain. Urine culture showed mixed urogenital flora. IR CT guided Aspiration of abscess showed Streptococcal Viridans. He was placed back on Augmentin  10/14/2023 CT read by [facility] radiology Postsurgical changes of abdominoperineal resection. Interval enlargement of  fluid collection in the surgical bed. Multiple foci of enhancing tissue at  the margins of this fluid collection, increased from prior study, suspicious for local recurrence.   Prominent retroperitoneal lymph nodes, some which are increased in size from prior study. These are indeterminate and may be either reactive or represent metastatic disease. Recommend continued attention on follow-up.   Centrally hypoattenuating right obturator lymph node, unchanged in size, consistent with treated disease.   Circumferential bladder wall thickening and irregularity, most pronounced on the left side. This may  be reactive in the setting of adjacent pelvic inflammatory changes. Correlate with urinalysis if there is clinical  concern for urinary tract infection.    11/14/2022 Imaging   MRI pelvis w wo contrast  1. Status post abdominoperineal  resection with left lower quadrant end colostomy. 2. Compared to prior CT and MR, no significant change in appearance of heterogeneous, rim enhancing presacral and low pelvic soft tissue and fluid. Largest heterogeneously enhancing conglomerate of fluid and soft tissue appears to closely involve the anteriorly abutting the seminal vesicles and measures 3.0 x 2.9 cm in largest axial dimension. This extends to superiorly contact the posterior bladder dome and inferiorly towards the gluteal cleft. 3. Discrete fluid component at the most inferior extent measuring 2.4 x 1.3 cm. This is markedly diminished in volume compared to more remote previous examinations, for example 06/26/2022. 4. Constellation of findings is highly concerning for locally recurrent rectal malignancy with or without superimposed infection. Presence or absence of infection at this time is not established by MR. 5. Additional unchanged hemorrhagic or proteinaceous fluid collection in the right hemipelvis measuring 3.2 x 2.6 cm most consistent with postoperative hematoma or seroma. 6. Unchanged thickening of the urinary bladder wall. As previously reported, fistula or involvement of bladder wall by malignancy not excluded.   12/09/2022 Procedure   CT guided biopsy of the pelvic mass showed Moderately differentiated adenocarcinoma with dirty necrosis, morphologically identical to patient's prior rectal adenocarcinoma.   [facility] NGS xT 648 panel showed MLH3 start loss,  BCORL1 frame shift, TP53 splice region varian, APC stop gain,  TMB 7.9, MS stable, KRAS/BRAF/NRAS negative.   [facility] NGS xR showed no gene arrangement or reportable altered splicing events in RNA sequencing      01/07/2023 -  Chemotherapy   Xeloda 825 mg/m2 twice daily  with radiation   02/11/2023 Imaging   CT abdomen pelvis w contrast  1. Examination is positive for small bowel obstruction. Transition point is identified within the left iliac fossa. Distal to the  transition point the small bowel loops exhibit mucosal enhancement and wall thickening concerning for enteritis. 2. There is new right-sided hydronephrosis and hydroureter up to the level of the bifurcation of right common iliac artery. The distal right ureter appears closely associated with the previously characterized tracer avid presacral soft tissue mass. Cannot exclude obstructive uropathy secondary to tumor involvement. 3. Similar appearance of presacral soft tissue mass. This was tracer avid on the recent PET-CT from 12/17/2022, and concerning for locally recurrent tumor. 4. Unchanged diffuse circumferential wall thickening involving the urinary bladder. 5. Aortic Atherosclerosis    04/17/2023 -  Chemotherapy   Patient is on Treatment Plan : COLORECTAL FOLFIRI + Panitumumab q14d       Patient presented to emergency room on 12/08/21, rectal pain.  CT scan showed 18.3 cm fluid and gas collection in the pelvic surgical bed compatible with infected collection/abscess.  Patient was sent to [facility] and admitted for.  Patient has CT-guided drain placement on 12/09/2021.  Blood culture positive for Staph epidermidis which was felt to be contaminant.  Fluid culture grew pansensitive Staph aureus, strep pyogenes and candida albicans.  Patient was treated with IV antibiotics and transition to p.o. Augmentin and fluconazole on 12/11/2021.  #Perirectal abscess status post drainage catheter , patient was recently seen by [facility] oncology surgeon on 12/24/2021 and was recommended to keep drainage catheter and continue antibiotics. Patient had a repeat CT scan done which showed a smaller but persistent abscess.  He will return  to [name] surgery for follow-up of drain removal. His case was discussed on [name] tumor board for the positive mesenteric margin which was felt to be secondary to previous perforation.  Recommend surveillance.  02/11/23 patient was admitted due to nausea, CT showed small bowel obstruction, severe  constipation, NG tube was placed. His symptoms improved and has ostomy output, NG was removed and he tolerated PO.   During the interval ER visit due to abdominal pain.  03/02/23 CT abdomen pelvis w contrast showed . Findings are again suggestive of probable partial small bowel obstruction which appears likely related to enteritis. Specifically, there are multiple areas of mural thickening throughout the distal small bowel with increased mucosal and mural enhancement, with some very mild proximal small bowel dilatation. 2. Status post APR with left lower quadrant colostomy and large soft tissue mass in the low anatomic pelvis which is slightly more prominent than the recent prior study, and was previously hypermetabolic on prior PET-CT 12/17/2022, suggesting residual disease. 3. Probable nodal mass along the right pelvic sidewall in the right internal iliac nodal distribution, similar to the prior study, previously not hypermetabolic on prior PET-CT. No other lymphadenopathy or definitive signs of metastatic disease noted elsewhere on today's examination.  4. Aortic atherosclerosis, as well as calcified atherosclerotic plaque in the right coronary artery. Please note that although the presence of coronary artery calcium documents the presence of coronary artery disease, the severity of this disease and any potential stenosis cannot be assessed on this non-gated CT examination. Assessment for potential risk factor modification, dietary therapy or pharmacologic therapy may be warranted, if clinically indicated. 5. Additional incidental findings, as above. Patient has radiation induced enteritis. His symptoms were mild he was discharged home.   INTERVAL HISTORY Barry [name]. is a 57 y.o. male who has above history reviewed by me today presents for follow up visit for management of recurrent locally advanced rectal cancer.  He feels  good today, good appetite, more tired.  Denies fever chills,  rectal pain. Denies rectal discharge.  Denies nausea vomiting or abdominal pain.      Review of Systems  Constitutional:  Positive for fatigue. Negative for appetite change, chills, fever and unexpected weight change.  HENT:   Negative for hearing loss and voice change.   Eyes:  Negative for eye problems and icterus.  Respiratory:  Negative for chest tightness, cough and shortness of breath.   Cardiovascular:  Negative for chest pain and leg swelling.  Gastrointestinal:  Negative for abdominal distention, abdominal pain, blood in stool, constipation, diarrhea and rectal pain.  Endocrine: Negative for hot flashes.  Genitourinary:  Negative for difficulty urinating, dysuria and frequency.   Musculoskeletal:  Negative for arthralgias.  Skin:  Positive for rash. Negative for itching.  Neurological:  Negative for light-headedness and numbness.  Hematological:  Negative for adenopathy. Does not bruise/bleed easily.  Psychiatric/Behavioral:  Negative for confusion.     MEDICAL HISTORY:  Past Medical History:  Diagnosis Date   Headache    Left shoulder pain    Rectal cancer (HCC)     SURGICAL HISTORY: Past Surgical History:  Procedure Laterality Date   COLON SURGERY     COLONOSCOPY WITH PROPOFOL N/A 02/06/2021   Procedure: COLONOSCOPY WITH PROPOFOL;  Surgeon: Toney Reil, MD;  Location: [facility] ENDOSCOPY;  Service: Gastroenterology;  Laterality: N/A;   PORTA CATH INSERTION N/A 02/27/2021   Procedure: PORTA CATH INSERTION;  Surgeon: Annice Needy, MD;  Location: [facility] INVASIVE CV LAB;  Service: Cardiovascular;  Laterality: N/A;   ROTATOR CUFF REPAIR Left     SOCIAL HISTORY: Social History   Socioeconomic History   Marital status: Married    Spouse name: Not on file   Number of children: Not on file   Years of education: Not on file   Highest education level: Not on file  Occupational History   Not on file  Tobacco Use   Smoking status: Former    Current packs/day: 0.00     Average packs/day: 1 pack/day for 10.0 years (10.0 ttl pk-yrs)    Types: Cigars, Cigarettes    Start date: 02/05/2011    Quit date: 02/04/2021    Years since quitting: 2.4   Smokeless tobacco: Never  Substance and Sexual Activity   Alcohol use: Not Currently   Drug use: No   Sexual activity: Not on file  Other Topics Concern   Not on file  Social History Narrative   Not on file   Social Determinants of Health   Financial Resource Strain: High Risk (02/13/2023)   Received from [facility], [facility]   Overall Financial Resource Strain (CARDIA)    Difficulty of Paying Living Expenses: Hard  Food Insecurity: No Food Insecurity (02/13/2023)   Received from [facility], [facility]   Hunger Vital Sign    Worried About Running Out of Food in the Last Year: Never true    Ran Out of Food in the Last Year: Never true  Transportation Needs: No Transportation Needs (02/13/2023)   Received from [facility], [facility]   [facility] - Transportation    In the past 12 months, has lack of transportation kept you from medical appointments or from getting medications?: No    Lack of Transportation (Non-Medical): No  Physical Activity: Inactive (11/03/2022)   Exercise Vital Sign    Days of Exercise per Week: 0 days    Minutes of Exercise per Session: 0 min  Stress: Stress Concern Present (11/03/2022)   [employer] of Occupational Health - Occupational Stress Questionnaire    Feeling of Stress : Rather much  Social Connections: Moderately Isolated (11/03/2022)   Social Connection and Isolation Panel [NHANES]    Frequency of Communication with Friends and Family: Twice a week    Frequency of Social Gatherings with Friends and Family: Three times a week    Attends Religious Services: 1 to 4 times per year    Active Member of Clubs or Organizations: No    Attends Banker  Meetings: Never    Marital Status: Separated  Intimate Partner Violence: Not At Risk (11/03/2022)   Humiliation, Afraid, Rape, and Kick questionnaire    Fear of Current or Ex-Partner: No    Emotionally Abused: No    Physically Abused: No    Sexually Abused: No    FAMILY HISTORY: Family History  Problem Relation Age of Onset   Cancer Sister    Diabetes Mother    Cancer Maternal Grandmother    Cancer Paternal Grandmother     ALLERGIES:  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  MEDICATIONS:  Current Outpatient Medications  Medication Sig Dispense Refill   calcium-vitamin D (OSCAL WITH D) 500-5 MG-MCG tablet Take 2 tablets by mouth daily. 60 tablet 3   citalopram (CELEXA) 10 MG tablet Take 1 tablet (10 mg total) by mouth daily. 30 tablet 3   clindamycin (CLINDAGEL) 1 % gel Apply topically 2 (two) times  daily. 30 g 4   lidocaine-prilocaine (EMLA) cream Apply 1 Application topically as needed. Apply small amount to port and cover with saran wrap 1-2 hours prior to port access 30 g 2   loperamide (IMODIUM) 2 MG capsule Take 2 capsules (4mg ) by mouth initially. Then take 1 capsule by mouth every 2 hours (4mg  every 4 hours at night). Take no more than 16mg /day 90 capsule 2   magnesium chloride (SLOW-MAG) 64 MG TBEC SR tablet Take 1 tablet (64 mg total) by mouth daily. 30 tablet 1   ondansetron (ZOFRAN) 8 MG tablet Take 1 tablet (8 mg total) by mouth every 8 (eight) hours as needed for nausea or vomiting. 90 tablet 1   prochlorperazine (COMPAZINE) 10 MG tablet Take 1 tablet (10 mg total) by mouth every 6 (six) hours as needed for nausea or vomiting. 90 tablet 1   nicotine (NICODERM CQ - DOSED IN MG/24 HR) 7 mg/24hr patch Place 1 patch (7 mg total) onto the skin daily. (Patient not taking: Reported on 06/01/2023) 28 patch 2   oxyCODONE (ROXICODONE) 5 MG immediate release tablet Take 1-2 tablets (5-10 mg total) by mouth every 6 (six) hours as needed for severe pain. (Patient not taking: Reported on  07/13/2023) 90 tablet 0   potassium chloride SA (KLOR-CON M) 20 MEQ tablet Take 2 tablets (40 mEq total) by mouth in the morning and take 1 tablet ( ) in the evening. See administration instructions. 60 tablet 2   senna (SENOKOT) 8.6 MG TABS tablet Take 2 tablets (17.2 mg total) by mouth daily. (Patient not taking: Reported on 07/27/2023) 100 tablet 0   traZODone (DESYREL) 50 MG tablet Take 1 tablet (50 mg total) by mouth at bedtime as needed for sleep. (Patient not taking: Reported on 04/28/2023) 30 tablet 3   No current facility-administered medications for this visit.   Facility-Administered Medications Ordered in Other Visits  Medication Dose Route Frequency Provider Last Rate Last Admin   atropine injection 0.5 mg  0.5 mg Intravenous Once PRN [name], MD       dexamethasone (DECADRON) injection 10 mg  10 mg Intravenous Once [name], MD       fluorouracil (ADRUCIL) 5,000 mg in sodium chloride 0.9 % 150 mL chemo infusion  2,400 mg/m2 (Order-Specific) Intravenous 1 day or 1 dose [name], MD       fluorouracil (ADRUCIL) chemo injection 750 mg  400 mg/m2 (Order-Specific) Intravenous Once [name], MD       heparin lock flush 100 unit/mL  500 Units Intravenous Once [name], MD       irinotecan (CAMPTOSAR) 340 mg in sodium chloride 0.9 % 500 mL chemo infusion  180 mg/m2 (Order-Specific) Intravenous Once [name], MD       leucovorin 760 mg in sodium chloride 0.9 % 250 mL infusion  400 mg/m2 (Order-Specific) Intravenous Once [name], MD       magnesium sulfate IVPB 2 g 50 mL  2 g Intravenous Once [name], MD 50 mL/hr at 07/27/23 0914 2 g at 07/27/23 0914   palonosetron (ALOXI) injection 0.25 mg  0.25 mg Intravenous Once [name], MD       panitumumab (VECTIBIX) 500 mg in sodium chloride 0.9 % 100 mL chemo infusion  6 mg/kg (Order-Specific) Intravenous Once [name], MD       sodium chloride flush (NS) 0.9 % injection 10 mL  10 mL Intracatheter PRN [name], MD   10 mL at 05/06/23 1246  PHYSICAL EXAMINATION: ECOG PERFORMANCE STATUS: 1 - Symptomatic but completely ambulatory Vitals:   07/27/23 0847  BP: (!) 133/97  Pulse: 89  Resp: 18  Temp: 97.6 F (36.4 C)  SpO2: 100%     Filed Weights   07/27/23 0847  Weight: 173 lb 9.6 oz (78.7 kg)      Physical Exam HENT:     Head: Normocephalic and atraumatic.  Eyes:     General: No scleral icterus. Cardiovascular:     Rate and Rhythm: Normal rate and regular rhythm.     Heart sounds: Normal heart sounds.  Pulmonary:     Effort: Pulmonary effort is normal. No respiratory distress.     Breath sounds: No wheezing.     Comments: Decreased breath sound bilaterally.  Abdominal:     General: Bowel sounds are normal. There is no distension.     Palpations: Abdomen is soft.     Comments: Colostomy bag  Genitourinary:    Comments: Status post APR, Musculoskeletal:        General: No deformity. Normal range of motion.     Cervical back: Normal range of motion and neck supple.  Skin:    General: Skin is warm and dry.     Findings: No erythema.     Comments: Acne on face.   Neurological:     Mental Status: He is alert and oriented to person, place, and time. Mental status is at baseline.     Cranial Nerves: No cranial nerve deficit.  Psychiatric:        Mood and Affect: Mood normal.     LABORATORY DATA:  I have reviewed the data as listed    Latest Ref Rng & Units 07/27/2023    8:05 AM 07/13/2023    7:54 AM 06/29/2023    8:11 AM  CBC  WBC 4.0 - 10.5 K/uL 3.3  4.3  3.9   Hemoglobin 13.0 - 17.0 g/dL 95.2  84.1  32.4   Hematocrit 39.0 - 52.0 % 36.0  39.8  39.2   Platelets 150 - 400 K/uL 184  242  182       Latest Ref Rng & Units 07/27/2023    8:05 AM 07/13/2023    7:54 AM 06/29/2023    8:11 AM  CMP  Glucose 70 - 99 mg/dL 401  027  253   BUN 6 - 20 mg/dL 8  9  9    Creatinine 0.61 - 1.24 mg/dL 6.64  4.03  4.74   Sodium 135 - 145 mmol/L 140  135  136   Potassium 3.5 - 5.1 mmol/L 3.3  3.9  2.9    Chloride 98 - 111 mmol/L 107  103  103   CO2 22 - 32 mmol/L 25  24  25    Calcium 8.9 - 10.3 mg/dL 8.2  9.0  8.4   Total Protein 6.5 - 8.1 g/dL 6.6  7.4  7.1   Total Bilirubin <1.2 mg/dL 0.5  0.4  0.4   Alkaline Phos 38 - 126 U/L 97  90  97   AST 15 - 41 U/L 20  17  22    ALT 0 - 44 U/L 15  14  16       RADIOGRAPHIC STUDIES: I have personally reviewed the radiological images as listed and agreed with the findings in the report. No results found.

## 2023-07-27 NOTE — Assessment & Plan Note (Signed)
IV Mag 2g  Recommend patient to start slow Mag 1 tab daily.

## 2023-07-27 NOTE — Progress Notes (Signed)
Patient here for follow up. No new concerns voiced.  °

## 2023-07-27 NOTE — Assessment & Plan Note (Signed)
 Due to chemotherapy.  Recommend clinamycin gel Topical BID PRN

## 2023-07-27 NOTE — Patient Instructions (Signed)
[facility] - A DEPT OF [facility]  Discharge Instructions: Thank you for choosing [facility] to provide your oncology and hematology care.  If you have a lab appointment with the Cancer Center, please go directly to the Cancer Center and check in at the registration area.  Wear comfortable clothing and clothing appropriate for easy access to any Portacath or PICC line.   We strive to give you quality time with your provider. You may need to reschedule your appointment if you arrive late (15 or more minutes).  Arriving late affects you and other patients whose appointments are after yours.  Also, if you miss three or more appointments without notifying the office, you may be dismissed from the clinic at the provider's discretion.      For prescription refill requests, have your pharmacy contact our office and allow 72 hours for refills to be completed.    Today you received the following chemotherapy and/or immunotherapy agents VECTEBEX, IRINOTECAN, LEUCOVORIN, MAGNESIUM SULFATE, 5 FU      To help prevent nausea and vomiting after your treatment, we encourage you to take your nausea medication as directed.  BELOW ARE SYMPTOMS THAT SHOULD BE REPORTED IMMEDIATELY: *FEVER GREATER THAN 100.4 F (38 C) OR HIGHER *CHILLS OR SWEATING *NAUSEA AND VOMITING THAT IS NOT CONTROLLED WITH YOUR NAUSEA MEDICATION *UNUSUAL SHORTNESS OF BREATH *UNUSUAL BRUISING OR BLEEDING *URINARY PROBLEMS (pain or burning when urinating, or frequent urination) *BOWEL PROBLEMS (unusual diarrhea, constipation, pain near the anus) TENDERNESS IN MOUTH AND THROAT WITH OR WITHOUT PRESENCE OF ULCERS (sore throat, sores in mouth, or a toothache) UNUSUAL RASH, SWELLING OR PAIN  UNUSUAL VAGINAL DISCHARGE OR ITCHING   Items with * indicate a potential emergency and should be followed up as soon as possible or go to the Emergency Department if any problems should occur.  Please show  the CHEMOTHERAPY ALERT CARD or IMMUNOTHERAPY ALERT CARD at check-in to the Emergency Department and triage nurse.  Should you have questions after your visit or need to cancel or reschedule your appointment, please contact [facility] - A DEPT OF [facility]  [phone] and follow the prompts.  Office hours are 8:00 a.m. to 4:30 p.m. Monday - Friday. Please note that voicemails left after 4:00 p.m. may not be returned until the following business day.  We are closed weekends and major holidays. You have access to a nurse at all times for urgent questions. Please call the main number to the clinic [phone] and follow the prompts.  For any non-urgent questions, you may also contact your provider using MyChart. We now offer e-Visits for anyone 56 and older to request care online for non-urgent symptoms. For details visit [email or web address].   Also download the MyChart app! Go to the app store, search "MyChart", open the app, select [place], and log in with your MyChart username and password.  Panitumumab Injection What is this medication? PANITUMUMAB (pan i TOOM ue mab) treats colorectal cancer. It works by blocking a protein that causes cancer cells to grow and multiply. This helps to slow or stop the spread of cancer cells. It is a monoclonal antibody. This medicine may be used for other purposes; ask your health care provider or pharmacist if you have questions. COMMON BRAND NAME(S): Vectibix What should I tell my care team before I take this medication? They need to know if you have any of these conditions: Eye disease Low levels of  magnesium in the blood Lung disease An unusual or allergic reaction to panitumumab, other medications, foods, dyes, or preservatives Pregnant or trying to get pregnant Breast-feeding How should I use this medication? This medication is injected into a vein. It is given by your care team in a hospital or clinic  setting. Talk to your care team about the use of this medication in children. Special care may be needed. Overdosage: If you think you have taken too much of this medicine contact a poison control center or emergency room at once. NOTE: This medicine is only for you. Do not share this medicine with others. What if I miss a dose? Keep appointments for follow-up doses. It is important not to miss your dose. Call your care team if you are unable to keep an appointment. What may interact with this medication? Bevacizumab This list may not describe all possible interactions. Give your health care provider a list of all the medicines, herbs, non-prescription drugs, or dietary supplements you use. Also tell them if you smoke, drink alcohol, or use illegal drugs. Some items may interact with your medicine. What should I watch for while using this medication? Your condition will be monitored carefully while you are receiving this medication. This medication may make you feel generally unwell. This is not uncommon as chemotherapy can affect healthy cells as well as cancer cells. Report any side effects. Continue your course of treatment even though you feel ill unless your care team tells you to stop. This medication can make you more sensitive to the sun. Keep out of the sun while receiving this medication and for 2 months after stopping therapy. If you cannot avoid being in the sun, wear protective clothing and sunscreen. Do not use sun lamps, tanning beds, or tanning booths. Check with your care team if you have severe diarrhea, nausea, and vomiting or if you sweat a lot. The loss of too much body fluid may make it dangerous for you to take this medication. This medication may cause serious skin reactions. They can happen weeks to months after starting the medication. Contact your care team right away if you notice fevers or flu-like symptoms with a rash. The rash may be red or purple and then turn into  blisters or peeling of the skin. You may also notice a red rash with swelling of the face, lips, or lymph nodes in your neck or under your arms. Talk to your care team if you may be pregnant. Serious birth defects can occur if you take this medication during pregnancy and for 2 months after the last dose. Contraception is recommended while taking this medication and for 2 months after the last dose. Your care team can help you find the option that works for you. Do not breastfeed while taking this medication and for 2 months after the last dose. This medication may cause infertility. Talk to your care team if you are concerned about your fertility. What side effects may I notice from receiving this medication? Side effects that you should report to your care team as soon as possible: Allergic reactions--skin rash, itching, hives, swelling of the face, lips, tongue, or throat Dry cough, shortness of breath or trouble breathing Eye pain, redness, irritation, or discharge with blurry or decreased vision Infusion reactions--chest pain, shortness of breath or trouble breathing, feeling faint or lightheaded Low magnesium level--muscle pain or cramps, unusual weakness or fatigue, fast or irregular heartbeat, tremors Low potassium level--muscle pain or cramps, unusual weakness or fatigue,  fast or irregular heartbeat, constipation Redness, blistering, peeling, or loosening of the skin, including inside the mouth Skin reactions on sun-exposed areas Side effects that usually do not require medical attention (report to your care team if they continue or are bothersome): Change in nail shape, thickness, or color Diarrhea Dry skin Fatigue Nausea Vomiting This list may not describe all possible side effects. Call your doctor for medical advice about side effects. You may report side effects to FDA at 1-800-FDA-1088. Where should I keep my medication? This medication is given in a hospital or clinic. It will  not be stored at home. NOTE: This sheet is a summary. It may not cover all possible information. If you have questions about this medicine, talk to your doctor, pharmacist, or health care provider.  2024 Elsevier/Gold Standard (2022-01-08 00:00:00)  Irinotecan Injection What is this medication? IRINOTECAN (ir in oh TEE kan) treats some types of cancer. It works by slowing down the growth of cancer cells. This medicine may be used for other purposes; ask your health care provider or pharmacist if you have questions. COMMON BRAND NAME(S): Camptosar What should I tell my care team before I take this medication? They need to know if you have any of these conditions: Dehydration Diarrhea Infection, especially a viral infection, such as chickenpox, cold sores, herpes Liver disease Low blood cell levels (white cells, red cells, and platelets) Low levels of electrolytes, such as calcium, magnesium, or potassium in your blood Recent or ongoing radiation An unusual or allergic reaction to irinotecan, other medications, foods, dyes, or preservatives If you or your partner are pregnant or trying to get pregnant Breast-feeding How should I use this medication? This medication is injected into a vein. It is given by your care team in a hospital or clinic setting. Talk to your care team about the use of this medication in children. Special care may be needed. Overdosage: If you think you have taken too much of this medicine contact a poison control center or emergency room at once. NOTE: This medicine is only for you. Do not share this medicine with others. What if I miss a dose? Keep appointments for follow-up doses. It is important not to miss your dose. Call your care team if you are unable to keep an appointment. What may interact with this medication? Do not take this medication with any of the following: Cobicistat Itraconazole This medication may also interact with the following: Certain  antibiotics, such as clarithromycin, rifampin, rifabutin Certain antivirals for HIV or AIDS Certain medications for fungal infections, such as ketoconazole, posaconazole, voriconazole Certain medications for seizures, such as carbamazepine, phenobarbital, phenytoin Gemfibrozil Nefazodone St. John's wort This list may not describe all possible interactions. Give your health care provider a list of all the medicines, herbs, non-prescription drugs, or dietary supplements you use. Also tell them if you smoke, drink alcohol, or use illegal drugs. Some items may interact with your medicine. What should I watch for while using this medication? Your condition will be monitored carefully while you are receiving this medication. You may need blood work while taking this medication. This medication may make you feel generally unwell. This is not uncommon as chemotherapy can affect healthy cells as well as cancer cells. Report any side effects. Continue your course of treatment even though you feel ill unless your care team tells you to stop. This medication can cause serious side effects. To reduce the risk, your care team may give you other medications to take before  receiving this one. Be sure to follow the directions from your care team. This medication may affect your coordination, reaction time, or judgement. Do not drive or operate machinery until you know how this medication affects you. Sit up or stand slowly to reduce the risk of dizzy or fainting spells. Drinking alcohol with this medication can increase the risk of these side effects. This medication may increase your risk of getting an infection. Call your care team for advice if you get a fever, chills, sore throat, or other symptoms of a cold or flu. Do not treat yourself. Try to avoid being around people who are sick. Avoid taking medications that contain aspirin, acetaminophen, ibuprofen, naproxen, or ketoprofen unless instructed by your care  team. These medications may hide a fever. This medication may increase your risk to bruise or bleed. Call your care team if you notice any unusual bleeding. Be careful brushing or flossing your teeth or using a toothpick because you may get an infection or bleed more easily. If you have any dental work done, tell your dentist you are receiving this medication. Talk to your care team if you or your partner are pregnant or think either of you might be pregnant. This medication can cause serious birth defects if taken during pregnancy and for 6 months after the last dose. You will need a negative pregnancy test before starting this medication. Contraception is recommended while taking this medication and for 6 months after the last dose. Your care team can help you find the option that works for you. Do not father a child while taking this medication and for 3 months after the last dose. Use a condom for contraception during this time period. Do not breastfeed while taking this medication and for 7 days after the last dose. This medication may cause infertility. Talk to your care team if you are concerned about your fertility. What side effects may I notice from receiving this medication? Side effects that you should report to your care team as soon as possible: Allergic reactions--skin rash, itching, hives, swelling of the face, lips, tongue, or throat Dry cough, shortness of breath or trouble breathing Increased saliva or tears, increased sweating, stomach cramping, diarrhea, small pupils, unusual weakness or fatigue, slow heartbeat Infection--fever, chills, cough, sore throat, wounds that don't heal, pain or trouble when passing urine, general feeling of discomfort or being unwell Kidney injury--decrease in the amount of urine, swelling of the ankles, hands, or feet Low red blood cell level--unusual weakness or fatigue, dizziness, headache, trouble breathing Severe or prolonged diarrhea Unusual  bruising or bleeding Side effects that usually do not require medical attention (report to your care team if they continue or are bothersome): Constipation Diarrhea Hair loss Loss of appetite Nausea Stomach pain This list may not describe all possible side effects. Call your doctor for medical advice about side effects. You may report side effects to FDA at 1-800-FDA-1088. Where should I keep my medication? This medication is given in a hospital or clinic. It will not be stored at home. NOTE: This sheet is a summary. It may not cover all possible information. If you have questions about this medicine, talk to your doctor, pharmacist, or health care provider.  2024 Elsevier/Gold Standard (2022-01-06 00:00:00)  Leucovorin Injection What is this medication? LEUCOVORIN (loo koe VOR in) prevents side effects from certain medications, such as methotrexate. It works by increasing folate levels. This helps protect healthy cells in your body. It may also be used to treat  anemia caused by low levels of folate. It can also be used with fluorouracil, a type of chemotherapy, to treat colorectal cancer. It works by increasing the effects of fluorouracil in the body. This medicine may be used for other purposes; ask your health care provider or pharmacist if you have questions. What should I tell my care team before I take this medication? They need to know if you have any of these conditions: Anemia from low levels of vitamin B12 in the blood An unusual or allergic reaction to leucovorin, folic acid, other medications, foods, dyes, or preservatives Pregnant or trying to get pregnant Breastfeeding How should I use this medication? This medication is injected into a vein or a muscle. It is given by your care team in a hospital or clinic setting. Talk to your care team about the use of this medication in children. Special care may be needed. Overdosage: If you think you have taken too much of this  medicine contact a poison control center or emergency room at once. NOTE: This medicine is only for you. Do not share this medicine with others. What if I miss a dose? Keep appointments for follow-up doses. It is important not to miss your dose. Call your care team if you are unable to keep an appointment. What may interact with this medication? Capecitabine Fluorouracil Phenobarbital Phenytoin Primidone Trimethoprim;sulfamethoxazole This list may not describe all possible interactions. Give your health care provider a list of all the medicines, herbs, non-prescription drugs, or dietary supplements you use. Also tell them if you smoke, drink alcohol, or use illegal drugs. Some items may interact with your medicine. What should I watch for while using this medication? Your condition will be monitored carefully while you are receiving this medication. This medication may increase the side effects of 5-fluorouracil. Tell your care team if you have diarrhea or mouth sores that do not get better or that get worse. What side effects may I notice from receiving this medication? Side effects that you should report to your care team as soon as possible: Allergic reactions--skin rash, itching, hives, swelling of the face, lips, tongue, or throat This list may not describe all possible side effects. Call your doctor for medical advice about side effects. You may report side effects to FDA at 1-800-FDA-1088. Where should I keep my medication? This medication is given in a hospital or clinic. It will not be stored at home. NOTE: This sheet is a summary. It may not cover all possible information. If you have questions about this medicine, talk to your doctor, pharmacist, or health care provider.  2024 Elsevier/Gold Standard (2022-01-28 00:00:00)  Magnesium Sulfate Injection What is this medication? MAGNESIUM SULFATE (mag NEE zee um SUL fate) prevents and treats low levels of magnesium in your body. It  may also be used to prevent and treat seizures during pregnancy in people with high blood pressure disorders, such as preeclampsia or eclampsia. Magnesium plays an important role in maintaining the health of your muscles and nervous system. This medicine may be used for other purposes; ask your health care provider or pharmacist if you have questions. What should I tell my care team before I take this medication? They need to know if you have any of these conditions: Heart disease History of irregular heart beat Kidney disease An unusual or allergic reaction to magnesium sulfate, medications, foods, dyes, or preservatives Pregnant or trying to get pregnant Breast-feeding How should I use this medication? This medication is for infusion into  a vein. It is given in a hospital or clinic setting. Talk to your care team about the use of this medication in children. While this medication may be prescribed for selected conditions, precautions do apply. Overdosage: If you think you have taken too much of this medicine contact a poison control center or emergency room at once. NOTE: This medicine is only for you. Do not share this medicine with others. What if I miss a dose? This does not apply. What may interact with this medication? Certain medications for anxiety or sleep Certain medications for seizures, such phenobarbital Digoxin Medications that relax muscles for surgery Narcotic medications for pain This list may not describe all possible interactions. Give your health care provider a list of all the medicines, herbs, non-prescription drugs, or dietary supplements you use. Also tell them if you smoke, drink alcohol, or use illegal drugs. Some items may interact with your medicine. What should I watch for while using this medication? Your condition will be monitored carefully while you are receiving this medication. You may need blood work done while you are receiving this medication. What  side effects may I notice from receiving this medication? Side effects that you should report to your care team as soon as possible: Allergic reactions--skin rash, itching, hives, swelling of the face, lips, tongue, or throat High magnesium level--confusion, drowsiness, facial flushing, redness, sweating, muscle weakness, fast or irregular heartbeat, trouble breathing Low blood pressure--dizziness, feeling faint or lightheaded, blurry vision Side effects that usually do not require medical attention (report to your care team if they continue or are bothersome): Headache Nausea This list may not describe all possible side effects. Call your doctor for medical advice about side effects. You may report side effects to FDA at 1-800-FDA-1088. Where should I keep my medication? This medication is given in a hospital or clinic and will not be stored at home. NOTE: This sheet is a summary. It may not cover all possible information. If you have questions about this medicine, talk to your doctor, pharmacist, or health care provider.  2024 Elsevier/Gold Standard (2021-05-08 00:00:00)  Fluorouracil Injection What is this medication? FLUOROURACIL (flure oh YOOR a sil) treats some types of cancer. It works by slowing down the growth of cancer cells. This medicine may be used for other purposes; ask your health care provider or pharmacist if you have questions. COMMON BRAND NAME(S): Adrucil What should I tell my care team before I take this medication? They need to know if you have any of these conditions: Blood disorders Dihydropyrimidine dehydrogenase (DPD) deficiency Infection, such as chickenpox, cold sores, herpes Kidney disease Liver disease Poor nutrition Recent or ongoing radiation therapy An unusual or allergic reaction to fluorouracil, other medications, foods, dyes, or preservatives If you or your partner are pregnant or trying to get pregnant Breast-feeding How should I use this  medication? This medication is injected into a vein. It is administered by your care team in a hospital or clinic setting. Talk to your care team about the use of this medication in children. Special care may be needed. Overdosage: If you think you have taken too much of this medicine contact a poison control center or emergency room at once. NOTE: This medicine is only for you. Do not share this medicine with others. What if I miss a dose? Keep appointments for follow-up doses. It is important not to miss your dose. Call your care team if you are unable to keep an appointment. What may interact with this  medication? Do not take this medication with any of the following: Live virus vaccines This medication may also interact with the following: Medications that treat or prevent blood clots, such as warfarin, enoxaparin, dalteparin This list may not describe all possible interactions. Give your health care provider a list of all the medicines, herbs, non-prescription drugs, or dietary supplements you use. Also tell them if you smoke, drink alcohol, or use illegal drugs. Some items may interact with your medicine. What should I watch for while using this medication? Your condition will be monitored carefully while you are receiving this medication. This medication may make you feel generally unwell. This is not uncommon as chemotherapy can affect healthy cells as well as cancer cells. Report any side effects. Continue your course of treatment even though you feel ill unless your care team tells you to stop. In some cases, you may be given additional medications to help with side effects. Follow all directions for their use. This medication may increase your risk of getting an infection. Call your care team for advice if you get a fever, chills, sore throat, or other symptoms of a cold or flu. Do not treat yourself. Try to avoid being around people who are sick. This medication may increase your risk  to bruise or bleed. Call your care team if you notice any unusual bleeding. Be careful brushing or flossing your teeth or using a toothpick because you may get an infection or bleed more easily. If you have any dental work done, tell your dentist you are receiving this medication. Avoid taking medications that contain aspirin, acetaminophen, ibuprofen, naproxen, or ketoprofen unless instructed by your care team. These medications may hide a fever. Do not treat diarrhea with over the counter products. Contact your care team if you have diarrhea that lasts more than 2 days or if it is severe and watery. This medication can make you more sensitive to the sun. Keep out of the sun. If you cannot avoid being in the sun, wear protective clothing and sunscreen. Do not use sun lamps, tanning beds, or tanning booths. Talk to your care team if you or your partner wish to become pregnant or think you might be pregnant. This medication can cause serious birth defects if taken during pregnancy and for 3 months after the last dose. A reliable form of contraception is recommended while taking this medication and for 3 months after the last dose. Talk to your care team about effective forms of contraception. Do not father a child while taking this medication and for 3 months after the last dose. Use a condom while having sex during this time period. Do not breastfeed while taking this medication. This medication may cause infertility. Talk to your care team if you are concerned about your fertility. What side effects may I notice from receiving this medication? Side effects that you should report to your care team as soon as possible: Allergic reactions--skin rash, itching, hives, swelling of the face, lips, tongue, or throat Heart attack--pain or tightness in the chest, shoulders, arms, or jaw, nausea, shortness of breath, cold or clammy skin, feeling faint or lightheaded Heart failure--shortness of breath, swelling of  the ankles, feet, or hands, sudden weight gain, unusual weakness or fatigue Heart rhythm changes--fast or irregular heartbeat, dizziness, feeling faint or lightheaded, chest pain, trouble breathing High ammonia level--unusual weakness or fatigue, confusion, loss of appetite, nausea, vomiting, seizures Infection--fever, chills, cough, sore throat, wounds that don't heal, pain or trouble when passing urine,  general feeling of discomfort or being unwell Low red blood cell level--unusual weakness or fatigue, dizziness, headache, trouble breathing Pain, tingling, or numbness in the hands or feet, muscle weakness, change in vision, confusion or trouble speaking, loss of balance or coordination, trouble walking, seizures Redness, swelling, and blistering of the skin over hands and feet Severe or prolonged diarrhea Unusual bruising or bleeding Side effects that usually do not require medical attention (report to your care team if they continue or are bothersome): Dry skin Headache Increased tears Nausea Pain, redness, or swelling with sores inside the mouth or throat Sensitivity to light Vomiting This list may not describe all possible side effects. Call your doctor for medical advice about side effects. You may report side effects to FDA at 1-800-FDA-1088. Where should I keep my medication? This medication is given in a hospital or clinic. It will not be stored at home. NOTE: This sheet is a summary. It may not cover all possible information. If you have questions about this medicine, talk to your doctor, pharmacist, or health care provider.  2024 [employer] Standard (2021-12-31 00:00:00)

## 2023-07-27 NOTE — Assessment & Plan Note (Signed)
Chemotherapy plan as listed

## 2023-07-27 NOTE — Assessment & Plan Note (Signed)
locally advanced rectal cancer-Stage IIIB, s/p TNT neoadjuvant protocol, concurrent Xeloda and radiation-Finished 08/27/2021, s/p  APR,rectal adenocarcinoma, ypT3N0. Positive radial margin due to perforation. Biopsy proven recurrent rectal cancer, locally advanced disease, possible colovesical fistula; no distant metastatic disease on PET- per [name]geon Dr. Luciano Cutter, recurrence is not resectable. --> June 2024 S/p concurrent Xeloda 825 mg/m2 twice daily  with radiation --> July 2024 MRI pelvis w wo contrast showed persistent disease. Disease is not resectable per colorectal surgeon --> FOLFIRI Panitumumab  Labs are reviewed and discussed with patient. Proceed with FOLFIRI + Panitumumab Repeat imaging after 6 cycles.

## 2023-07-27 NOTE — Assessment & Plan Note (Addendum)
Recommend to increase potassium to in AM and in PM.

## 2023-07-29 ENCOUNTER — Inpatient Hospital Stay: Payer: Medicare Other

## 2023-07-29 VITALS — BP 134/85 | HR 95 | Resp 18

## 2023-07-29 DIAGNOSIS — Z5111 Encounter for antineoplastic chemotherapy: Secondary | ICD-10-CM | POA: Diagnosis not present

## 2023-07-29 DIAGNOSIS — Z933 Colostomy status: Secondary | ICD-10-CM | POA: Diagnosis not present

## 2023-07-29 DIAGNOSIS — R911 Solitary pulmonary nodule: Secondary | ICD-10-CM | POA: Diagnosis not present

## 2023-07-29 DIAGNOSIS — Z5112 Encounter for antineoplastic immunotherapy: Secondary | ICD-10-CM | POA: Diagnosis not present

## 2023-07-29 DIAGNOSIS — L308 Other specified dermatitis: Secondary | ICD-10-CM | POA: Diagnosis not present

## 2023-07-29 DIAGNOSIS — C20 Malignant neoplasm of rectum: Secondary | ICD-10-CM | POA: Diagnosis not present

## 2023-07-29 DIAGNOSIS — Z923 Personal history of irradiation: Secondary | ICD-10-CM | POA: Diagnosis not present

## 2023-07-29 DIAGNOSIS — E876 Hypokalemia: Secondary | ICD-10-CM | POA: Diagnosis not present

## 2023-07-29 DIAGNOSIS — Z95828 Presence of other vascular implants and grafts: Secondary | ICD-10-CM

## 2023-07-29 MED ORDER — SODIUM CHLORIDE 0.9% FLUSH
10.0000 mL | Freq: Once | INTRAVENOUS | Status: AC
  Administered 2023-07-29: 10 mL via INTRAVENOUS
  Filled 2023-07-29: qty 10

## 2023-07-29 MED ORDER — HEPARIN SOD (PORK) LOCK FLUSH 100 UNIT/ML IV SOLN
500.0000 [IU] | Freq: Once | INTRAVENOUS | Status: AC
  Administered 2023-07-29: 500 [IU] via INTRAVENOUS
  Filled 2023-07-29: qty 5

## 2023-08-03 ENCOUNTER — Ambulatory Visit: Admission: RE | Admit: 2023-08-03 | Payer: Medicare Other | Source: Ambulatory Visit

## 2023-08-05 ENCOUNTER — Ambulatory Visit: Payer: 59 | Admitting: Radiation Oncology

## 2023-08-10 ENCOUNTER — Other Ambulatory Visit: Payer: 59

## 2023-08-10 ENCOUNTER — Ambulatory Visit: Payer: 59 | Admitting: Oncology

## 2023-08-10 ENCOUNTER — Other Ambulatory Visit (HOSPITAL_COMMUNITY): Payer: Self-pay

## 2023-08-10 ENCOUNTER — Ambulatory Visit
Admission: RE | Admit: 2023-08-10 | Discharge: 2023-08-10 | Disposition: A | Discharge status: Home / Self Care | Payer: Medicare Other | Source: Ambulatory Visit | Attending: Oncology | Admitting: Oncology

## 2023-08-10 ENCOUNTER — Other Ambulatory Visit: Payer: Self-pay

## 2023-08-10 ENCOUNTER — Ambulatory Visit: Payer: 59

## 2023-08-10 DIAGNOSIS — C20 Malignant neoplasm of rectum: Secondary | ICD-10-CM | POA: Diagnosis present

## 2023-08-10 DIAGNOSIS — K8689 Other specified diseases of pancreas: Secondary | ICD-10-CM | POA: Diagnosis not present

## 2023-08-10 DIAGNOSIS — I2699 Other pulmonary embolism without acute cor pulmonale: Secondary | ICD-10-CM | POA: Diagnosis not present

## 2023-08-10 DIAGNOSIS — R1900 Intra-abdominal and pelvic swelling, mass and lump, unspecified site: Secondary | ICD-10-CM | POA: Diagnosis not present

## 2023-08-10 DIAGNOSIS — R932 Abnormal findings on diagnostic imaging of liver and biliary tract: Secondary | ICD-10-CM | POA: Diagnosis not present

## 2023-08-10 MED ORDER — IOHEXOL 300 MG/ML  SOLN
85.0000 mL | Freq: Once | INTRAMUSCULAR | Status: AC | PRN
  Administered 2023-08-10: 85 mL via INTRAVENOUS

## 2023-08-10 MED ORDER — BARIUM SULFATE 2 % PO SUSP
450.0000 mL | ORAL | Status: AC
  Administered 2023-08-10 (×2): 450 mL via ORAL

## 2023-08-10 MED ORDER — HEPARIN SOD (PORK) LOCK FLUSH 100 UNIT/ML IV SOLN
500.0000 [IU] | Freq: Once | INTRAVENOUS | Status: AC
  Administered 2023-08-10: 500 [IU] via INTRAVENOUS

## 2023-08-12 ENCOUNTER — Other Ambulatory Visit: Payer: Self-pay

## 2023-08-14 ENCOUNTER — Other Ambulatory Visit: Payer: Self-pay | Admitting: Oncology

## 2023-08-14 MED ORDER — APIXABAN (ELIQUIS) VTE STARTER PACK (10MG AND 5MG): ORAL_TABLET | ORAL | 0 refills | Status: DC

## 2023-08-17 ENCOUNTER — Other Ambulatory Visit: Payer: Self-pay | Admitting: Oncology

## 2023-08-17 ENCOUNTER — Encounter: Payer: Self-pay | Admitting: Oncology

## 2023-08-17 ENCOUNTER — Other Ambulatory Visit: Payer: Self-pay

## 2023-08-17 ENCOUNTER — Inpatient Hospital Stay: Payer: Medicare Other

## 2023-08-17 ENCOUNTER — Telehealth: Payer: Self-pay

## 2023-08-17 ENCOUNTER — Inpatient Hospital Stay: Payer: Medicare Other | Admitting: Oncology

## 2023-08-17 DIAGNOSIS — Z86711 Personal history of pulmonary embolism: Secondary | ICD-10-CM | POA: Insufficient documentation

## 2023-08-17 DIAGNOSIS — C20 Malignant neoplasm of rectum: Secondary | ICD-10-CM

## 2023-08-17 DIAGNOSIS — I2699 Other pulmonary embolism without acute cor pulmonale: Secondary | ICD-10-CM | POA: Insufficient documentation

## 2023-08-17 MED ORDER — APIXABAN (ELIQUIS) VTE STARTER PACK (10MG AND 5MG)
ORAL_TABLET | ORAL | 0 refills | Status: DC
  Filled 2023-08-17: qty 74, 30d supply, fill #0

## 2023-08-17 NOTE — Telephone Encounter (Signed)
Called pt to inform him of new tx appt details next week. Pt states that he does not want anymore chemo but he will see Dr. Cathie Hoops and labs for follow up. Informed him that we will keep treatment appt as scheduled and he can further discuss discontinuing treatment at follow up visit.  Also informed about CT scan resuts and incidental PE finding. Pt request Eliquis to be sent to [facility] pharmacy. Medication sent to [facility] pharm. Advised him to go to ER if he starts experincing shortness of breath, chest pain. Pt verbalized understanding.   Will call [facility] to cancel Eliquis script

## 2023-08-17 NOTE — Assessment & Plan Note (Signed)
locally advanced rectal cancer-Stage IIIB, s/p TNT neoadjuvant protocol, concurrent Xeloda and radiation-Finished 08/27/2021, s/p  APR,rectal adenocarcinoma, [id number]. Positive radial margin due to perforation. Biopsy proven recurrent rectal cancer, locally advanced disease, possible colovesical fistula; no distant metastatic disease on PET- per [name] Surgeon Dr. Luciano Cutter, recurrence is not resectable. --> June 2024 S/p concurrent Xeloda 825 mg/m2 twice daily  with radiation --> July 2024 MRI pelvis w wo contrast showed persistent disease. Disease is not resectable per colorectal surgeon --> FOLFIRI Panitumumab  Labs are reviewed and discussed with patient. Proceed with FOLFIRI + Panitumumab

## 2023-08-19 ENCOUNTER — Inpatient Hospital Stay: Payer: Medicare Other

## 2023-08-21 ENCOUNTER — Other Ambulatory Visit: Payer: Self-pay | Admitting: Oncology

## 2023-08-24 ENCOUNTER — Ambulatory Visit: Payer: 59

## 2023-08-24 ENCOUNTER — Other Ambulatory Visit: Payer: 59

## 2023-08-24 ENCOUNTER — Ambulatory Visit: Payer: 59 | Admitting: Oncology

## 2023-08-25 ENCOUNTER — Inpatient Hospital Stay: Payer: Medicare Other | Attending: Oncology

## 2023-08-25 ENCOUNTER — Inpatient Hospital Stay (HOSPITAL_BASED_OUTPATIENT_CLINIC_OR_DEPARTMENT_OTHER): Payer: Medicare Other | Admitting: Oncology

## 2023-08-25 ENCOUNTER — Encounter: Payer: Self-pay | Admitting: Oncology

## 2023-08-25 ENCOUNTER — Inpatient Hospital Stay: Payer: Medicare Other

## 2023-08-25 ENCOUNTER — Other Ambulatory Visit: Payer: Self-pay

## 2023-08-25 VITALS — BP 145/92 | HR 89 | Temp 96.3°F | Resp 18 | Ht 68.0 in | Wt 181.1 lb

## 2023-08-25 DIAGNOSIS — R911 Solitary pulmonary nodule: Secondary | ICD-10-CM

## 2023-08-25 DIAGNOSIS — Z933 Colostomy status: Secondary | ICD-10-CM | POA: Insufficient documentation

## 2023-08-25 DIAGNOSIS — Z923 Personal history of irradiation: Secondary | ICD-10-CM | POA: Diagnosis not present

## 2023-08-25 DIAGNOSIS — E876 Hypokalemia: Secondary | ICD-10-CM | POA: Insufficient documentation

## 2023-08-25 DIAGNOSIS — Z95828 Presence of other vascular implants and grafts: Secondary | ICD-10-CM

## 2023-08-25 DIAGNOSIS — C20 Malignant neoplasm of rectum: Secondary | ICD-10-CM | POA: Insufficient documentation

## 2023-08-25 DIAGNOSIS — Z79899 Other long term (current) drug therapy: Secondary | ICD-10-CM | POA: Insufficient documentation

## 2023-08-25 DIAGNOSIS — Z87891 Personal history of nicotine dependence: Secondary | ICD-10-CM | POA: Diagnosis not present

## 2023-08-25 DIAGNOSIS — I2699 Other pulmonary embolism without acute cor pulmonale: Secondary | ICD-10-CM

## 2023-08-25 LAB — CMP (CANCER CENTER ONLY)
ALT: 16 U/L (ref 0–44)
AST: 21 U/L (ref 15–41)
Albumin: 3.5 g/dL (ref 3.5–5.0)
Alkaline Phosphatase: 75 U/L (ref 38–126)
Anion gap: 10 (ref 5–15)
BUN: 6 mg/dL (ref 6–20)
CO2: 22 mmol/L (ref 22–32)
Calcium: 8.5 mg/dL — ABNORMAL LOW (ref 8.9–10.3)
Chloride: 106 mmol/L (ref 98–111)
Creatinine: 0.93 mg/dL (ref 0.61–1.24)
GFR, Estimated: >60 mL/min (ref >60)
Glucose, Bld: 124 mg/dL — ABNORMAL HIGH (ref 70–99)
Potassium: 3.6 mmol/L (ref 3.5–5.1)
Sodium: 138 mmol/L (ref 135–145)
Total Bilirubin: 0.5 mg/dL (ref <1.2)
Total Protein: 6.7 g/dL (ref 6.5–8.1)

## 2023-08-25 LAB — CBC WITH DIFFERENTIAL (CANCER CENTER ONLY)
Abs Immature Granulocytes: 0.03 10*3/uL (ref 0.00–0.07)
Basophils Absolute: 0 10*3/uL (ref 0.0–0.1)
Basophils Relative: 1 %
Eosinophils Absolute: 0.1 10*3/uL (ref 0.0–0.5)
Eosinophils Relative: 2 %
HCT: 37.7 % — ABNORMAL LOW (ref 39.0–52.0)
Hemoglobin: 13.1 g/dL (ref 13.0–17.0)
Immature Granulocytes: 1 %
Lymphocytes Relative: 20 %
Lymphs Abs: 1 10*3/uL (ref 0.7–4.0)
MCH: 33.1 pg (ref 26.0–34.0)
MCHC: 34.7 g/dL (ref 30.0–36.0)
MCV: 95.2 fL (ref 80.0–100.0)
Monocytes Absolute: 1.1 10*3/uL — ABNORMAL HIGH (ref 0.1–1.0)
Monocytes Relative: 21 %
Neutro Abs: 2.9 10*3/uL (ref 1.7–7.7)
Neutrophils Relative %: 55 %
Platelet Count: 214 10*3/uL (ref 150–400)
RBC: 3.96 MIL/uL — ABNORMAL LOW (ref 4.22–5.81)
RDW: 15.3 % (ref 11.5–15.5)
Smear Review: NORMAL
WBC Count: 5.2 10*3/uL (ref 4.0–10.5)
nRBC: 0.4 % — ABNORMAL HIGH (ref 0.0–0.2)

## 2023-08-25 LAB — MAGNESIUM: Magnesium: 1.6 mg/dL — ABNORMAL LOW (ref 1.7–2.4)

## 2023-08-25 MED ORDER — LOPERAMIDE HCL 2 MG PO CAPS
2.0000 mg | ORAL_CAPSULE | ORAL | 2 refills | Status: AC
  Filled 2023-08-25: qty 90, 11d supply, fill #0

## 2023-08-25 MED ORDER — CAPECITABINE 500 MG PO TABS: 850.0000 mg/m2 | ORAL_TABLET | Freq: Two times a day (BID) | ORAL | Status: DC

## 2023-08-25 MED ORDER — SODIUM CHLORIDE 0.9% FLUSH
10.0000 mL | INTRAVENOUS | Status: DC | PRN
  Administered 2023-08-25: 10 mL via INTRAVENOUS
  Filled 2023-08-25: qty 10

## 2023-08-25 MED ORDER — ONDANSETRON HCL 8 MG PO TABS
8.0000 mg | ORAL_TABLET | Freq: Three times a day (TID) | ORAL | 1 refills | Status: AC | PRN
  Filled 2023-08-25: qty 18, 6d supply, fill #0

## 2023-08-25 MED ORDER — CAPECITABINE 150 MG PO TABS: 150.0000 mg | ORAL_TABLET | Freq: Two times a day (BID) | ORAL | Status: DC

## 2023-08-25 MED ORDER — HEPARIN SOD (PORK) LOCK FLUSH 100 UNIT/ML IV SOLN
500.0000 [IU] | Freq: Once | INTRAVENOUS | Status: AC
  Administered 2023-08-25: 500 [IU] via INTRAVENOUS
  Filled 2023-08-25: qty 5

## 2023-08-25 NOTE — Assessment & Plan Note (Signed)
K is stable continue potassium to in AM and in PM.

## 2023-08-25 NOTE — Assessment & Plan Note (Signed)
Previously discussed with IR, CT-guided biopsy is difficult due to the position of small size. Negative on PET evaluation.  Attention on follow-up CT scans.

## 2023-08-25 NOTE — Progress Notes (Signed)
Hematology/Oncology Progress note [phone] [phone]      CHIEF COMPLAINTS/REASON FOR VISIT:  Follow up for recurrent rectal cancer treatment  ASSESSMENT & PLAN:   Cancer Staging  Rectal cancer [facility]) Staging form: Colon and Rectum, AJCC 8th Edition - Clinical stage from 02/08/2021: Stage IIIB (cT4a, cN1, cM0) - Signed by [name], MD on 03/06/2021   Rectal cancer [facility]) locally advanced rectal cancer-Stage IIIB, s/p TNT neoadjuvant protocol, concurrent Xeloda and radiation-Finished 08/27/2021, s/p  APR,rectal adenocarcinoma, ypT3N0. Positive radial margin due to perforation. Biopsy proven recurrent rectal cancer, locally advanced disease, possible colovesical fistula; no distant metastatic disease on PET- per [name] Surgeon Dr. Luciano Cutter, recurrence is not resectable. --> June 2024 S/p concurrent Xeloda 825 mg/m2 twice daily  with radiation --> July 2024 MRI pelvis w wo contrast showed persistent disease. Disease is not resectable per colorectal surgeon --> FOLFIRI Panitumumab --> Dec 2025 partial response.  Labs are reviewed and discussed with patient. Patient adamantly did not want to continue 5-FU pump. He also prefers no weekly 5-FU infusion as alternative.  Shared decision was made to switch to Xeloda with panitumumab. I will omit Irinotecan due to toxicity concerns combining with Xeloda.  Plan Xeloda 850 mg/m2 twice daily on days 1 to 14 of a 21-day cycle plus Panitumumab Q3 weeks. [Panel study regimen]   Acute pulmonary embolism (HCC) Continue Eliquis 5mg  BID  Hypomagnesemia Recommend patient to continue slow Mag 1 tab daily.   Hypokalemia K is stable continue potassium to in AM and in PM.   Lung nodule Previously discussed with IR, CT-guided biopsy is difficult due to the position of small size. Negative on PET evaluation.  Attention on follow-up CT scans.       No orders of the defined types were placed in this encounter. Follow-up   2 weeks All questions were answered. The patient knows to call the clinic with any problems, questions or concerns.  [name], MD, PhD [facility] Hematology Oncology 08/25/2023        HISTORY OF PRESENTING ILLNESS:   Barry [name]. is a  57 y.o.  male presents for rectal cancer Oncology History  Rectal cancer (HCC)  02/08/2021 Initial Diagnosis   Rectal cancer   02/05/2021-02/06/2021 patient was hospitalized due to generalized weakness, intermittent lightheadedness, weight loss and worsening constipation.  02/05/2021 CT abdomen showed concerning of severe rectal wall thickening and right internal iliac lymph node concerning for metastatic disease.  Patient was seen by gastroenterology and had colonoscopy which showed a circumferential fungating mass in the rectum.  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   02/14/2021-02/16/2021 hospitalized due to rectal bleeding and pain.   02/14/2021 CT showed perirectal fluid collection/gas concerning for infection with significant leukocytosis, anemia with hemoglobin of 6.8.  Patient received PRBC transfusion, IV antibiotics.  He underwent an IR guided placement of JP drain into the rectal abscess.  Discharged home with oral Augmentin. 02/20/2021 presented to ER with new skin opening and draining from left gluteus.  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  JP drain has been removed.  Patient was continued on Augmentin.   02/13/2021, PET scan showed locally advanced rectal cancer with billowing of the mesorectum now with low attenuation material that was not present on previous examination with extensive stranding and inflammation.   Bulky RIGHT pelvic sidewall/hypogastric lymph node outside of the mesorectum with stippled calcification measuring 19 mm-no increased metabolic activity. Small LEFT hypogastric lymph node just peripheral to the  internal external bifurcation-SUV 3.2 High RIGHT internal just below or at the  internal/common iliac transition, lymph node -SUV 4.8 LEFT high hypogastric lymph node  8 mm-SUV 3. Scattered lymph nodes throughout the retroperitoneum with low FDG uptake Bilateral inguinal lymph nodes largest on the RIGHT (image 248/3) 11 mm with a maximum SUV of 2.8 spiculated nodule in the LEFT upper lobe- 9 x 8 mm       02/14/2021, CT abdomen pelvis without contrast Showed perforated rectal mass with contained perforation with fluid and gas extending above and below the pelvic floor,potentially involving the sphincter complex and extending into LEFT ischial rectal fossa   02/20/2021, CT pelvis with contrast showed Large perirectal/perianal abscess has markedly decreased in size since placement of the percutaneous drain. There are residual gas-filled collections in the soft tissues and suspect there is a fistula or sinus tract between the rectal mass and the subcutaneous tissues. Soft tissue gas along the medial left buttock and concern for a cutaneous ulceration in this area. Large rectal mass with evidence for a large necrotic right pelvic lymph node.   His case is complicated with a perirectal abscess. Prior to onset of perirectal abscess, on his 02/05/2021 scan, he was noted to have right internal iliac lymph node 3 x 1 x 2.3 cm which is concerning for nodal disease.On Subsequent images it was difficult to distinguish whether lymphadenopathy was due to nodal disease versus acute inflammation. 03/07/2021 MRI pelvis cT3 N2. Due to the possible contained perforation on previous CT, possible cT4 disease.    02/27/2021 medi port placed by Dr.Dew 03/13/2021 reports right butt cheek and also left perianal area fullness.he was seen by Dr.Pabon urgently  and had right buttock abscess drained. Left perianal fullness was felt to be due to cancer.    02/08/2021 Cancer Staging   Staging form: Colon and Rectum, AJCC 8th Edition - Clinical stage from 02/08/2021: Stage IIIB (cT4a, cN1, cM0) - Signed by [name], MD  on 03/06/2021 Stage prefix: Initial diagnosis   02/27/2021 Procedure   medi port placed by Dr.Dew   03/18/2021 - 07/05/2021 Chemotherapy    FOLFOX q14d x 4 months      07/17/2021 - 08/27/2021 Chemotherapy   Xeloda concurrent with Radiation   11/18/2021 Surgery   patient is status post open APR with flap for rectal cancer. Pathology rectal adenomacarcinoma ypT3N0.  Positive margin: Radial (circumferential) or mesenteric: adjacent to perforation      01/27/2022 - 02/02/2022 Hospital Admission   Hospitalized at [facility] due to recurrent abscess.  US guided drain into the pelvic abscess with return of 60 ml of purulent fluid. Cultures positive for staph aureus and strep agalactiae group b, fungal cultures negative.    02/25/2022 Imaging   CT chest angiogram with and without contrast, CT abdomen pelvis with and without contrast 1. Negative for acute pulmonary embolus.2. Emphysema. Further decrease in size of the previously noted irregular nodule in the left upper lobe which is now barely measurable today. No new suspicious lung nodules 3. Status post left lower quadrant colostomy. Further decrease in size since comparison exam from May of the rim enhancing gas and fluid collection at the pelvic surgical bed extending from the perineum superiorly into the pelvis 4. Slightly thickened appearance of terminal ileal small bowel loops in the pelvis with mild stranding suggesting small bowel inflammatory process. 5. Stable enlarged right pelvic sidewall lymph node   06/02/2022 Imaging   CT chest abdomen pelvis w contrast 1. Status post abdominal perineal  resection with descending colostomy. 2. At the site of pelvic fluid and gas collection on 02/25/2022, there is residual, decreased presacral soft tissue fullness, without drainable collection. 3. Similar right obturator nodal metastasis.4.  No acute process or evidence of metastatic disease in the chest. 5. Age advanced coronary artery atherosclerosis.  Recommend assessment of coronary risk factors. 6. Aortic atherosclerosis and emphysema     06/26/2022 Imaging   CT abdomen pelvis with contrast showed 1. Interval increase in size of a lobulated partially imaged at least 7.8 x 3.8 x 12 cm abscess in the perianal region in a patient status post abdominal perineal resection and left lower lobe end colostomy formation. Finding extends to involve the pre sacral region up to the urinary dome level. Associated posterior urinary bladder wall thickening with lack of intraperitoneal fat plane between the presacral soft tissue thickening/abscess formation suggestive of possible fistulization and invasion of the posterior bladder wall. Underlying recurrent malignancy is not excluded. 2. Stable 2.7 cm right pelvic sidewall lymph node. 3.  Aortic Atherosclerosis   06/26/2022 - 06/27/2022 Hospital Admission   He went to [facility] ER and CT showed recurrent abscess. He was transferred to [facility] due to recurrent abscess, treated with IV vanc/zosyn, IR was consulted and drainage tube was placed. Wound grew up Strep viridans. Patient was seen by ID at [facility] discharged with Augmentin for 2 weeks.   He was seen by wound care Dr> Kandace Blitz on 07/09/2022, JP drain was removed.    09/29/2022 Imaging   MRI pelvis without contrast showed  Interval abdominoperitoneal resection since prior MRI. Decreased small fluid collection in the surgical bed compared with more recent CT, consistent with resolving postoperative fluid collection or abscess.   Bulky rounded presacral mass, which shows diffuse restricted diffusion, without significant change in size since most recent CT of 06/26/2022. This raises suspicion for recurrent carcinoma over post treatment changes. Recommend correlation with CEA level, and consider tissue sampling or PET-CT.   Stable enlarged right pelvic sidewall lymph node. No new or increased adenopathy identified.   10/09/2022 Imaging   CT chest with contrast  showed 1. Unchanged 0.4 cm fissural nodule of the anterior left lower lobe. This is almost certainly a benign fissural lymph node. No new or suspicious pulmonary nodules. 2. Moderate emphysema and diffuse bilateral bronchial wall thickening. 3. Coronary artery disease.   10/13/2022 Imaging   CT abdomen pelvis with contrast showed 1. Complex collection persists in the lower pelvis, extending from the anal verge upwards into the presacral space and anteriorly from the presacral space to the bladder dome, not significantly changed in size or extent compared to the earlier CT of 06/26/2022. This is most likely a combination of a postoperative seroma and/or chronic phlegmon/abscess versus recurrent abscess. 2. Bladder walls are thick walled/edematous. Given the contiguity of the presacral collection and the bladder dome, this is highly suspicious for a related bladder wall infection and possibly secondary to colovesical fistula. Recommend correlation with urinalysis. 3. No evidence of bowel obstruction. LEFT lower abdominal wall colostomy, without obstruction or inflammatory change. 4. No free intraperitoneal air   10/13/2022 [facility] Admission   Hospitalized due to fever and rectal pain. Urine culture showed mixed urogenital flora. IR CT guided Aspiration of abscess showed Streptococcal Viridans. He was placed back on Augmentin  10/14/2023 CT read by [facility] radiology Postsurgical changes of abdominoperineal resection. Interval enlargement of  fluid collection in the surgical bed. Multiple foci of enhancing tissue at  the margins of this  fluid collection, increased from prior study, suspicious for local recurrence.   Prominent retroperitoneal lymph nodes, some which are increased in size from prior study. These are indeterminate and may be either reactive or represent metastatic disease. Recommend continued attention on follow-up.   Centrally hypoattenuating right obturator lymph node, unchanged in size,  consistent with treated disease.   Circumferential bladder wall thickening and irregularity, most pronounced on the left side. This may be reactive in the setting of adjacent pelvic inflammatory changes. Correlate with urinalysis if there is clinical  concern for urinary tract infection.    11/14/2022 Imaging   MRI pelvis w wo contrast  1. Status post abdominoperineal resection with left lower quadrant end colostomy. 2. Compared to prior CT and MR, no significant change in appearance of heterogeneous, rim enhancing presacral and low pelvic soft tissue and fluid. Largest heterogeneously enhancing conglomerate of fluid and soft tissue appears to closely involve the anteriorly abutting the seminal vesicles and measures 3.0 x 2.9 cm in largest axial dimension. This extends to superiorly contact the posterior bladder dome and inferiorly towards the gluteal cleft. 3. Discrete fluid component at the most inferior extent measuring 2.4 x 1.3 cm. This is markedly diminished in volume compared to more remote previous examinations, for example 06/26/2022. 4. Constellation of findings is highly concerning for locally recurrent rectal malignancy with or without superimposed infection. Presence or absence of infection at this time is not established by MR. 5. Additional unchanged hemorrhagic or proteinaceous fluid collection in the right hemipelvis measuring 3.2 x 2.6 cm most consistent with postoperative hematoma or seroma. 6. Unchanged thickening of the urinary bladder wall. As previously reported, fistula or involvement of bladder wall by malignancy not excluded.   12/09/2022 Procedure   CT guided biopsy of the pelvic mass showed Moderately differentiated adenocarcinoma with dirty necrosis, morphologically identical to patient's prior rectal adenocarcinoma.   [facility] NGS xT 648 panel showed MLH3 start loss,  BCORL1 frame shift, TP53 splice region varian, APC stop gain,  TMB 7.9, MS stable, KRAS/BRAF/NRAS negative.    [facility] NGS xR showed no gene arrangement or reportable altered splicing events in RNA sequencing      01/07/2023 -  Chemotherapy   Xeloda 825 mg/m2 twice daily  with radiation   02/11/2023 Imaging   CT abdomen pelvis w contrast  1. Examination is positive for small bowel obstruction. Transition point is identified within the left iliac fossa. Distal to the transition point the small bowel loops exhibit mucosal enhancement and wall thickening concerning for enteritis. 2. There is new right-sided hydronephrosis and hydroureter up to the level of the bifurcation of right common iliac artery. The distal right ureter appears closely associated with the previously characterized tracer avid presacral soft tissue mass. Cannot exclude obstructive uropathy secondary to tumor involvement. 3. Similar appearance of presacral soft tissue mass. This was tracer avid on the recent PET-CT from 12/17/2022, and concerning for locally recurrent tumor. 4. Unchanged diffuse circumferential wall thickening involving the urinary bladder. 5. Aortic Atherosclerosis    04/17/2023 - 07/29/2023 Chemotherapy   Patient is on Treatment Plan : COLORECTAL FOLFIRI + Panitumumab q14d     08/14/2023 Imaging   CT chest abdomen pelvis w contrast showed  Heterogeneous central pelvic mass in the area of the rectum overall similar configuration to the previous CT scan. Again the lesion abuts the bladder in the area of the seminal vesicles. Extension up along the presacral space. Surgical changes from diverting colostomy.   Essentially stable right pelvic sidewall  soft tissue mass or nodal enlargement. No new nodal enlargement elsewhere in the chest, abdomen or pelvis.   Fatty liver infiltration.   Subtle nodular opacity along the right lung base. Infiltrates possible. Please correlate with symptoms and recommend follow-up.   Incidental right lower lobe segmental and lobar pulmonary embolism.       Patient presented to  emergency room on 12/08/21, rectal pain.  CT scan showed 18.3 cm fluid and gas collection in the pelvic surgical bed compatible with infected collection/abscess.  Patient was sent to [facility] and admitted for.  Patient has CT-guided drain placement on 12/09/2021.  Blood culture positive for Staph epidermidis which was felt to be contaminant.  Fluid culture grew pansensitive Staph aureus, strep pyogenes and candida albicans.  Patient was treated with IV antibiotics and transition to p.o. Augmentin and fluconazole on 12/11/2021.  #Perirectal abscess status post drainage catheter , patient was recently seen by [facility] oncology surgeon on 12/24/2021 and was recommended to keep drainage catheter and continue antibiotics. Patient had a repeat CT scan done which showed a smaller but persistent abscess.  He will return to [facility] surgery for follow-up of drain removal. His case was discussed on [name] tumor board for the positive mesenteric margin which was felt to be secondary to previous perforation.  Recommend surveillance.  02/11/23 patient was admitted due to nausea, CT showed small bowel obstruction, severe constipation, NG tube was placed. His symptoms improved and has ostomy output, NG was removed and he tolerated PO.   During the interval ER visit due to abdominal pain.  03/02/23 CT abdomen pelvis w contrast showed . Findings are again suggestive of probable partial small bowel obstruction which appears likely related to enteritis. Specifically, there are multiple areas of mural thickening throughout the distal small bowel with increased mucosal and mural enhancement, with some very mild proximal small bowel dilatation. 2. Status post APR with left lower quadrant colostomy and large soft tissue mass in the low anatomic pelvis which is slightly more prominent than the recent prior study, and was previously hypermetabolic on prior PET-CT 12/17/2022, suggesting residual disease. 3. Probable nodal mass along the right pelvic  sidewall in the right internal iliac nodal distribution, similar to the prior study, previously not hypermetabolic on prior PET-CT. No other lymphadenopathy or definitive signs of metastatic disease noted elsewhere on today's examination.  4. Aortic atherosclerosis, as well as calcified atherosclerotic plaque in the right coronary artery. Please note that although the presence of coronary artery calcium documents the presence of coronary artery disease, the severity of this disease and any potential stenosis cannot be assessed on this non-gated CT examination. Assessment for potential risk factor modification, dietary therapy or pharmacologic therapy may be warranted, if clinically indicated. 5. Additional incidental findings, as above. Patient has radiation induced enteritis. His symptoms were mild he was discharged home.   INTERVAL HISTORY Minoru Quain. is a 57 y.o. male who has above history reviewed by me today presents for follow up visit for management of recurrent locally advanced rectal cancer.  He feels  good today, good appetite, more tired.  Denies fever chills, rectal pain. Denies rectal discharge.  Denies nausea vomiting or abdominal pain.   He takes Eliquis, no bleedings events. No SOB.  He declines chemotherapy today.     Review of Systems  Constitutional:  Positive for fatigue. Negative for appetite change, chills, fever and unexpected weight change.  HENT:   Negative for hearing loss and voice change.   Eyes:  Negative for eye problems and icterus.  Respiratory:  Negative for chest tightness, cough and shortness of breath.   Cardiovascular:  Negative for chest pain and leg swelling.  Gastrointestinal:  Negative for abdominal distention, abdominal pain, blood in stool, constipation, diarrhea and rectal pain.  Endocrine: Negative for hot flashes.  Genitourinary:  Negative for difficulty urinating, dysuria and frequency.   Musculoskeletal:  Negative for arthralgias.   Skin:  Negative for itching and rash.  Neurological:  Negative for light-headedness and numbness.  Hematological:  Negative for adenopathy. Does not bruise/bleed easily.  Psychiatric/Behavioral:  Negative for confusion.     MEDICAL HISTORY:  Past Medical History:  Diagnosis Date   Headache    Left shoulder pain    Rectal cancer (HCC)     SURGICAL HISTORY: Past Surgical History:  Procedure Laterality Date   COLON SURGERY     COLONOSCOPY WITH PROPOFOL N/A 02/06/2021   Procedure: COLONOSCOPY WITH PROPOFOL;  Surgeon: Toney Reil, MD;  Location: [facility] ENDOSCOPY;  Service: Gastroenterology;  Laterality: N/A;   PORTA CATH INSERTION N/A 02/27/2021   Procedure: PORTA CATH INSERTION;  Surgeon: Annice Needy, MD;  Location: [facility] INVASIVE CV LAB;  Service: Cardiovascular;  Laterality: N/A;   ROTATOR CUFF REPAIR Left     SOCIAL HISTORY: Social History   Socioeconomic History   Marital status: Married    Spouse name: Not on file   Number of children: Not on file   Years of education: Not on file   Highest education level: Not on file  Occupational History   Not on file  Tobacco Use   Smoking status: Former    Current packs/day: 0.00    Average packs/day: 1 pack/day for 10.0 years (10.0 ttl pk-yrs)    Types: Cigars, Cigarettes    Start date: 02/05/2011    Quit date: 02/04/2021    Years since quitting: 2.5   Smokeless tobacco: Never  Substance and Sexual Activity   Alcohol use: Not Currently   Drug use: No   Sexual activity: Not on file  Other Topics Concern   Not on file  Social History Narrative   Not on file   Social Drivers of Health   Financial Resource Strain: High Risk (02/13/2023)   Received from [facility], [facility]   Overall Financial Resource Strain (CARDIA)    Difficulty of Paying Living Expenses: Hard  Food Insecurity: No Food Insecurity (02/13/2023)   [facility], [facility]   Hunger Vital Sign    Worried About Running Out of Food in the Last Year: Never true    Ran Out of Food in the Last Year: Never true  Transportation Needs: No Transportation Needs (02/13/2023)   Received from [facility], [facility]   [facility] - Transportation    In the past 12 months, has lack of transportation kept you from medical appointments or from getting medications?: No    Lack of Transportation (Non-Medical): No  Physical Activity: Inactive (11/03/2022)   Exercise Vital Sign    Days of Exercise per Week: 0 days    Minutes of Exercise per Session: 0 min  Stress: Stress Concern Present (11/03/2022)   [employer] of Occupational Health - Occupational Stress Questionnaire    Feeling of Stress : Rather much  Social Connections: Moderately Isolated (11/03/2022)   Social Connection and Isolation Panel [NHANES]    Frequency of Communication with Friends and Family: Twice  a week    Frequency of Social Gatherings with Friends and Family: Three times a week    Attends Religious Services: 1 to 4 times per year    Active Member of Clubs or Organizations: No    Attends Banker Meetings: Never    Marital Status: Separated  Intimate Partner Violence: Not At Risk (11/03/2022)   Humiliation, Afraid, Rape, and Kick questionnaire    Fear of Current or Ex-Partner: No    Emotionally Abused: No    Physically Abused: No    Sexually Abused: No    FAMILY HISTORY: Family History  Problem Relation Age of Onset   Cancer Sister    Diabetes Mother    Cancer Maternal Grandmother    Cancer Paternal Grandmother     ALLERGIES:  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  MEDICATIONS:  Current Outpatient Medications  Medication Sig Dispense Refill   APIXABAN (ELIQUIS) VTE STARTER PACK (10MG  AND 5MG ) Take as directed on package: start with two-5mg  tablets twice daily for 7 days. On day 8, switch to one-5mg  tablet twice daily. 74 each 0    potassium chloride SA (KLOR-CON M) 20 MEQ tablet Take 2 tablets (40 mEq total) by mouth in the morning and take 1 tablet ( ) in the evening. 60 tablet 2   prochlorperazine (COMPAZINE) 10 MG tablet Take 1 tablet (10 mg total) by mouth every 6 (six) hours as needed for nausea or vomiting. 90 tablet 1   calcium-vitamin D (OSCAL WITH D) 500-5 MG-MCG tablet Take 2 tablets by mouth daily. (Patient not taking: Reported on 08/25/2023) 60 tablet 3   citalopram (CELEXA) 10 MG tablet Take 1 tablet (10 mg total) by mouth daily. (Patient not taking: Reported on 08/25/2023) 30 tablet 3   clindamycin (CLINDAGEL) 1 % gel Apply topically 2 (two) times daily. (Patient not taking: Reported on 08/25/2023) 30 g 4   lidocaine-prilocaine (EMLA) cream Apply 1 Application topically as needed. Apply small amount to port and cover with saran wrap 1-2 hours prior to port access (Patient not taking: Reported on 08/25/2023) 30 g 2   loperamide (IMODIUM) 2 MG capsule Take 1 capsule (2 mg total) by mouth See admin instructions. 90 capsule 2   magnesium chloride (SLOW-MAG) 64 MG TBEC SR tablet Take 1 tablet (64 mg total) by mouth daily. (Patient not taking: Reported on 08/25/2023) 30 tablet 1   nicotine (NICODERM CQ - DOSED IN MG/24 HR) 7 mg/24hr patch Place 1 patch (7 mg total) onto the skin daily. (Patient not taking: Reported on 08/25/2023) 28 patch 2   ondansetron (ZOFRAN) 8 MG tablet Take 1 tablet (8 mg total) by mouth every 8 (eight) hours as needed for nausea or vomiting. 90 tablet 1   oxyCODONE (ROXICODONE) 5 MG immediate release tablet Take 1-2 tablets (5-10 mg total) by mouth every 6 (six) hours as needed for severe pain. (Patient not taking: Reported on 08/25/2023) 90 tablet 0   senna (SENOKOT) 8.6 MG TABS tablet Take 2 tablets (17.2 mg total) by mouth daily. (Patient not taking: Reported on 08/25/2023) 100 tablet 0   traZODone (DESYREL) 50 MG tablet Take 1 tablet (50 mg total) by mouth at bedtime as needed for sleep.  (Patient not taking: Reported on 08/25/2023) 30 tablet 3   No current facility-administered medications for this visit.   Facility-Administered Medications Ordered in Other Visits  Medication Dose Route Frequency Provider Last Rate Last Admin   heparin lock flush 100 unit/mL  500 Units Intravenous Once [name],  MD         PHYSICAL EXAMINATION: ECOG PERFORMANCE STATUS: 1 - Symptomatic but completely ambulatory Vitals:   08/25/23 1057  BP: (!) 145/92  Pulse: 89  Resp: 18  Temp: (!) 96.3 F (35.7 C)  SpO2: 100%     Filed Weights   08/25/23 1057  Weight: 181 lb 1.6 oz (82.1 kg)      Physical Exam HENT:     Head: Normocephalic and atraumatic.  Eyes:     General: No scleral icterus. Cardiovascular:     Rate and Rhythm: Normal rate and regular rhythm.     Heart sounds: Normal heart sounds.  Pulmonary:     Effort: Pulmonary effort is normal. No respiratory distress.     Breath sounds: No wheezing.     Comments: Decreased breath sound bilaterally.  Abdominal:     General: Bowel sounds are normal. There is no distension.     Palpations: Abdomen is soft.     Comments: Colostomy bag  Genitourinary:    Comments: Status post APR, Musculoskeletal:        General: No deformity. Normal range of motion.     Cervical back: Normal range of motion and neck supple.  Skin:    General: Skin is warm and dry.     Findings: No erythema.     Comments: Acne on face.   Neurological:     Mental Status: He is alert and oriented to person, place, and time. Mental status is at baseline.     Cranial Nerves: No cranial nerve deficit.  Psychiatric:        Mood and Affect: Mood normal.     LABORATORY DATA:  I have reviewed the data as listed    Latest Ref Rng & Units 08/25/2023   10:32 AM 07/27/2023    8:05 AM 07/13/2023    7:54 AM  CBC  WBC 4.0 - 10.5 K/uL 5.2  3.3  4.3   Hemoglobin 13.0 - 17.0 g/dL 44.0  10.2  72.5   Hematocrit 39.0 - 52.0 % 37.7  36.0  39.8   Platelets 150 -  400 K/uL 214  184  242       Latest Ref Rng & Units 08/25/2023   10:32 AM 07/27/2023    8:05 AM 07/13/2023    7:54 AM  CMP  Glucose 70 - 99 mg/dL 366  440  347   BUN 6 - 20 mg/dL 6  8  9    Creatinine 0.61 - 1.24 mg/dL 4.25  9.56  3.87   Sodium 135 - 145 mmol/L 138  140  135   Potassium 3.5 - 5.1 mmol/L 3.6  3.3  3.9   Chloride 98 - 111 mmol/L 106  107  103   CO2 22 - 32 mmol/L 22  25  24    Calcium 8.9 - 10.3 mg/dL 8.5  8.2  9.0   Total Protein 6.5 - 8.1 g/dL 6.7  6.6  7.4   Total Bilirubin <1.2 mg/dL 0.5  0.5  0.4   Alkaline Phos 38 - 126 U/L 75  97  90   AST 15 - 41 U/L 21  20  17    ALT 0 - 44 U/L 16  15  14       RADIOGRAPHIC STUDIES: I have personally reviewed the radiological images as listed and agreed with the findings in the report. CT CHEST ABDOMEN PELVIS W CONTRAST Result Date: 08/14/2023 CLINICAL DATA:  Rectal cancer staging.  * Tracking  Code: BO *. EXAM: CT CHEST, ABDOMEN, AND PELVIS WITH CONTRAST TECHNIQUE: Multidetector CT imaging of the chest, abdomen and pelvis was performed following the standard protocol during bolus administration of intravenous contrast. RADIATION DOSE REDUCTION: This exam was performed according to the departmental dose-optimization program which includes automated exposure control, adjustment of the mA and/or kV according to patient size and/or use of iterative reconstruction technique. CONTRAST:  85mL OMNIPAQUE IOHEXOL 300 MG/ML  SOLN COMPARISON:  Pelvic MRI 03/30/2023. Abdomen and pelvis CT 03/02/2023 and older. PET-CT scan 12/17/2022. FINDINGS: CT CHEST FINDINGS Cardiovascular: Right upper chest port in place with the tip seen as far as the right atrium. The port is accessed. Heart is nonenlarged. No pericardial effusion. Thoracic aorta has a normal course and caliber with slight atherosclerotic change. Note is made of right lower lobe pulmonary embolism in the anterior lower lobe such as series 2, image 35. Small amount of clot along the lobar  vessels as well as seen on coronal image 75. No larger or additional central emboli identified. Mediastinum/Nodes: Slightly patulous thoracic esophagus. Preserved thyroid gland. No discrete abnormal lymph node enlargement present in the axillary regions, hilum or mediastinum. Lungs/Pleura: Centrilobular emphysematous changes identified. No pneumothorax. There is breathing motion. There is some patchy opacity identified along the right lower lobe. Atelectasis versus infiltrates possible. Recommend correlation with a particular symptoms. Follow up as clinically directed. There is a small nodule along the margin of the left interlobar fissure measuring 5 mm. Essentially unchanged from a chest CT of 10/09/2022. This has been stable since at least September 2023 as well. Musculoskeletal: Scattered degenerative changes. CT ABDOMEN PELVIS FINDINGS Hepatobiliary: Fatty liver infiltration. Patent portal vein. Gallbladder is present low-attenuation lesion posteroinferior along the right hepatic lobe previously measuring 13 mm is stable today on series 2, image 67 again likely a benign cystic lesion. Otherwise no clear space-occupying liver lesion at this time. Pancreas: Moderate atrophy of the pancreas without obvious mass. Spleen: Small spleen. Adrenals/Urinary Tract: Stable slight nonspecific thickening of the adrenal glands. No enhancing renal mass or collecting system dilatation. The ureters have normal course and caliber extending down to the underdistended urinary bladder. There are some tiny low-attenuation foci along the kidneys which are too small to completely characterize but likely benign cystic foci, Bosniak 2 lesions. No specific imaging follow-up of these renal foci Stomach/Bowel: Oral contrast was administered. The stomach is underdistended. Small bowel is nondilated. 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 Proximal this there is moderate colonic stool. The colon is nondilated. Normal retrocecal appendix. In  the area of the rectum is a focal heterogeneous mass abutting the posterior aspect of the urinary bladder. In the axial plane previous study measured of the lesion at 5.3 x 4.9 cm. When measuring in the same fashion at a similar level today 5.6 by 4.6 cm, similar. This abuts the adjacent musculature laterally. Cephalocaudal extension in the presacral space approaches 10.3 cm today and going back to the prior when measured in the same fashion 9.8 cm. Also abuts some small bowel loops in the central pelvis posteriorly. Involvement of the seminal vesicles is possible. Vascular/Lymphatic: Diffuse vascular calcifications. Normal caliber aorta and IVC. Circumaortic left renal vein. No new lymph node enlargement identified in the upper abdomen. The right pelvic sidewall lesion previously measuring 2.8 x 3.2 cm, today on series 2, image 112 measures 3.3 x 2.6 cm. Similar. Reproductive: Poor definition of the prostate and seminal vesicles. Other: Stranding of the fat in the pelvis and lower abdomen.  No free intra-air. No loculated fluid collections clearly seen. Musculoskeletal: Curvature of the spine. Scattered degenerative changes of the spine and pelvis. Scattered Schmorl's node changes. IMPRESSION: Heterogeneous central pelvic mass in the area of the rectum overall similar configuration to the previous CT scan. Again the lesion abuts the bladder in the area of the seminal vesicles. Extension up along the presacral space. Surgical changes from diverting colostomy. Essentially stable right pelvic sidewall soft tissue mass or nodal enlargement. No new nodal enlargement elsewhere in the chest, abdomen or pelvis. Fatty liver infiltration. Subtle nodular opacity along the right lung base. Infiltrates possible. Please correlate with symptoms and recommend follow-up. Incidental right lower lobe segmental and lobar pulmonary embolism. Critical Value/emergent results were called by telephone at the time of interpretation on  08/14/2023 at 5:58 pm to provider Dr. Orlie Dakin, who verbally acknowledged these results. Electronically Signed   By: Karen Kays M.D.   On: 08/14/2023 17:59

## 2023-08-25 NOTE — Assessment & Plan Note (Signed)
Recommend patient to continue slow Mag 1 tab daily.

## 2023-08-25 NOTE — Progress Notes (Signed)
DISCONTINUE ON PATHWAY REGIMEN - Colorectal     A cycle is every 14 days:     Panitumumab      Irinotecan      Leucovorin      Fluorouracil      Fluorouracil   **Always confirm dose/schedule in your pharmacy ordering system**  PRIOR TREATMENT: COS84: FOLFIRI + Panitumumab q14 Days  START OFF PATHWAY REGIMEN - Colorectal   [id number]:Capecitabine 850 mg/m2 PO BID D1-14 + Oxaliplatin 130 mg/m2 IV D1 + Panitumumab 9 mg/kg IV D1 q21 Days:   A cycle is every 21 days:     Capecitabine      Panitumumab      Oxaliplatin   **Always confirm dose/schedule in your pharmacy ordering system**  Patient Characteristics: Distant Metastases, Nonsurgical Candidate, KRAS/NRAS Wild-Type (BRAF V600 Wild-Type/Unknown), Standard Cytotoxic Therapy, Second Line Standard Cytotoxic Therapy, Bevacizumab Ineligible Tumor Location: Rectal Therapeutic Status: Distant Metastases Microsatellite/Mismatch Repair Status: MSS/pMMR BRAF Mutation Status: Wild-Type (no mutation) KRAS/NRAS Mutation Status: Wild-Type (no mutation) Preferred Therapy Approach: Standard Cytotoxic Therapy Standard Cytotoxic Line of Therapy: Second Line Standard Cytotoxic Therapy Bevacizumab Eligibility: Ineligible Intent of Therapy: Non-Curative / Palliative Intent, Discussed with Patient

## 2023-08-25 NOTE — Assessment & Plan Note (Signed)
Continue Eliquis 5mg BID 

## 2023-08-25 NOTE — Assessment & Plan Note (Addendum)
locally advanced rectal cancer-Stage IIIB, s/p TNT neoadjuvant protocol, concurrent Xeloda and radiation-Finished 08/27/2021, s/p  APR,rectal adenocarcinoma, ypT3N0. Positive radial margin due to perforation. Biopsy proven recurrent rectal cancer, locally advanced disease, possible colovesical fistula; no distant metastatic disease on PET- per [name] Surgeon Dr. Luciano Cutter, recurrence is not resectable. --> June 2024 S/p concurrent Xeloda 825 mg/m2 twice daily  with radiation --> July 2024 MRI pelvis w wo contrast showed persistent disease. Disease is not resectable per colorectal surgeon --> FOLFIRI Panitumumab --> Dec 2025 partial response.  Labs are reviewed and discussed with patient. Patient adamantly did not want to continue 5-FU pump. He also prefers no weekly 5-FU infusion as alternative.  Shared decision was made to switch to Xeloda with panitumumab. I will omit Irinotecan due to toxicity concerns combining with Xeloda.  Plan Xeloda 850 mg/m2 twice daily on days 1 to 14 of a 21-day cycle plus Panitumumab Q3 weeks. [Panel study regimen]

## 2023-08-26 ENCOUNTER — Encounter: Payer: Self-pay | Admitting: Oncology

## 2023-08-26 ENCOUNTER — Other Ambulatory Visit (HOSPITAL_COMMUNITY): Payer: Self-pay

## 2023-08-26 ENCOUNTER — Telehealth: Payer: Self-pay | Admitting: Pharmacist

## 2023-08-26 ENCOUNTER — Telehealth: Payer: Self-pay

## 2023-08-26 DIAGNOSIS — C20 Malignant neoplasm of rectum: Secondary | ICD-10-CM

## 2023-08-26 LAB — CEA: CEA: 2 ng/mL (ref 0.0–4.7)

## 2023-08-26 MED ORDER — CAPECITABINE 500 MG PO TABS: 1500.0000 mg | ORAL_TABLET | Freq: Two times a day (BID) | ORAL | 1 refills | Status: DC

## 2023-08-26 MED ORDER — CAPECITABINE 150 MG PO TABS: 150.0000 mg | ORAL_TABLET | Freq: Two times a day (BID) | ORAL | 1 refills | Status: DC

## 2023-08-26 NOTE — Telephone Encounter (Signed)
Clinical Pharmacist Practitioner Encounter   Received new prescription for Xeloda (capecitabine) for the treatment of unresectable rectal cancer in conjunction with panitumumab, planned duration until disease progression or unacceptable drug toxicity. Planned start 09/10/22.   CMP from 08/25/23 assessed, no relevant lab abnormalities. Prescription dose and frequency assessed.   Current medication list in Epic reviewed, one DDIs with capecitabine identified: Citalopram: Fluorouracil Products may enhance the QTc-prolonging effect of QT-prolonging Antidepressants such as citalopram. Not a new interaction for the patient, he has been on Fluorouracil Products and citalopram for awhile.   Evaluated chart and no patient barriers to medication adherence identified.   Prescription has been e-scribed to the [facility] for benefits analysis and approval.  Oral Oncology Clinic will continue to follow for insurance authorization, copayment issues, initial counseling and start date.   Remi Haggard, PharmD, BCPS, BCOP, CPP Hematology/Oncology Clinical Pharmacist Practitioner [facility] [phone]  08/26/2023 9:04 AM

## 2023-08-26 NOTE — Telephone Encounter (Addendum)
Oral Oncology Patient Advocate Encounter  After completing a benefits investigation, prior authorization for Capecitabine is not required at this time through [employer].  Patient must fill through [facility] per insurance.    Scripts and all supporting documents sent to [facility] for processing and fulfillment.    Ardeen Fillers, CPhT Oncology Pharmacy Patient Advocate  [facility]  [phone] (phone) [phone] (fax) 08/26/2023 8:45 AM

## 2023-08-26 NOTE — Telephone Encounter (Signed)
Patient knows his Rx was sent to [facility]. I asked him to call [facility] next week if he has not heard from them to make sure he has medication in hand by 1/3. He already has [facility] phone number.   Will mail med calendar to patient. Patient did not require education as he has been on capecitabine in the past

## 2023-08-26 NOTE — Telephone Encounter (Signed)
Patient knows Rx was sent to [facility]

## 2023-08-27 ENCOUNTER — Inpatient Hospital Stay: Payer: Medicare Other

## 2023-08-27 ENCOUNTER — Ambulatory Visit: Payer: Medicare Other | Attending: Radiation Oncology | Admitting: Radiation Oncology

## 2023-08-31 ENCOUNTER — Other Ambulatory Visit: Payer: 59

## 2023-08-31 ENCOUNTER — Ambulatory Visit: Payer: 59

## 2023-08-31 ENCOUNTER — Ambulatory Visit: Payer: 59 | Admitting: Internal Medicine

## 2023-08-31 NOTE — Progress Notes (Signed)
Pharmacist Chemotherapy Monitoring - Initial Assessment    Anticipated start date: 09/11/23   The following has been reviewed per standard work regarding the patient's treatment regimen: The patient's diagnosis, treatment plan and drug doses, and organ/hematologic function Lab orders and baseline tests specific to treatment regimen  The treatment plan start date, drug sequencing, and pre-medications Prior authorization status  Patient's documented medication list, including drug-drug interaction screen and prescriptions for anti-emetics and supportive care specific to the treatment regimen The drug concentrations, fluid compatibility, administration routes, and timing of the medications to be used The patient's access for treatment and lifetime cumulative dose history, if applicable  The patient's medication allergies and previous infusion related reactions, if applicable   Changes made to treatment plan:  N/A  Follow up needed:  N/A   Ebony Hail, Pharm.D., CPP 08/31/2023@1 :41 PM

## 2023-09-07 NOTE — Telephone Encounter (Signed)
Called [facility] to check status of Capecitabine scripts (150 mg and 500 mg). Informed by representative that patient had medications delivered today, 09/07/23.    Ardeen Fillers, CPhT Oncology Pharmacy Patient Advocate  [facility]  [phone] (phone) [phone] (fax) 09/07/2023 2:36 PM

## 2023-09-08 ENCOUNTER — Other Ambulatory Visit: Payer: 59

## 2023-09-08 ENCOUNTER — Ambulatory Visit: Payer: 59 | Admitting: Oncology

## 2023-09-08 ENCOUNTER — Ambulatory Visit: Payer: 59

## 2023-09-08 ENCOUNTER — Other Ambulatory Visit: Payer: Self-pay

## 2023-09-10 NOTE — Assessment & Plan Note (Addendum)
 locally advanced rectal cancer-Stage IIIB, s/p TNT neoadjuvant protocol, concurrent Xeloda  and radiation-Finished 08/27/2021, s/p  APR,rectal adenocarcinoma, ypT3N0. Positive radial margin due to perforation. Biopsy proven recurrent rectal cancer, locally advanced disease, possible colovesical fistula; no distant metastatic disease on PET- per [name] Surgeon Dr. Florie, recurrence is not resectable. --> June 2024 S/p concurrent Xeloda  825 mg/m2 twice daily  with radiation --> July 2024 MRI pelvis w wo contrast showed persistent disease. Disease is not resectable per colorectal surgeon --> FOLFIRI Panitumumab  --> Dec 2025 partial response.  Labs are reviewed and discussed with patient. Patient adamantly did not want to continue 5-FU pump. He also prefers no weekly 5-FU infusion as alternative.  Shared decision was made to switch to Xeloda  with panitumumab . I will omit Irinotecan  due to toxicity concerns combining with Xeloda .  Plan Xeloda  850 mg/m2 twice daily on days 1 to 14 of a 21-day cycle plus Panitumumab  Q3 weeks. [Panel study regimen] Hold treatment today due to dysuria

## 2023-09-11 ENCOUNTER — Inpatient Hospital Stay: Payer: Medicare Other

## 2023-09-11 ENCOUNTER — Inpatient Hospital Stay: Payer: Medicare Other | Attending: Oncology

## 2023-09-11 ENCOUNTER — Inpatient Hospital Stay (HOSPITAL_BASED_OUTPATIENT_CLINIC_OR_DEPARTMENT_OTHER): Payer: Medicare Other | Attending: Oncology | Admitting: Oncology

## 2023-09-11 ENCOUNTER — Ambulatory Visit: Payer: Medicare Other

## 2023-09-11 ENCOUNTER — Encounter: Payer: Self-pay | Admitting: Oncology

## 2023-09-11 VITALS — BP 165/105 | HR 86 | Temp 97.3°F

## 2023-09-11 DIAGNOSIS — E876 Hypokalemia: Secondary | ICD-10-CM | POA: Diagnosis not present

## 2023-09-11 DIAGNOSIS — Z933 Colostomy status: Secondary | ICD-10-CM | POA: Insufficient documentation

## 2023-09-11 DIAGNOSIS — C20 Malignant neoplasm of rectum: Secondary | ICD-10-CM | POA: Diagnosis not present

## 2023-09-11 DIAGNOSIS — Z923 Personal history of irradiation: Secondary | ICD-10-CM | POA: Insufficient documentation

## 2023-09-11 DIAGNOSIS — I10 Essential (primary) hypertension: Secondary | ICD-10-CM | POA: Diagnosis not present

## 2023-09-11 DIAGNOSIS — Z87891 Personal history of nicotine dependence: Secondary | ICD-10-CM | POA: Diagnosis not present

## 2023-09-11 DIAGNOSIS — Z5111 Encounter for antineoplastic chemotherapy: Secondary | ICD-10-CM

## 2023-09-11 DIAGNOSIS — R300 Dysuria: Secondary | ICD-10-CM | POA: Diagnosis not present

## 2023-09-11 DIAGNOSIS — Z86711 Personal history of pulmonary embolism: Secondary | ICD-10-CM | POA: Diagnosis not present

## 2023-09-11 DIAGNOSIS — Z7901 Long term (current) use of anticoagulants: Secondary | ICD-10-CM | POA: Insufficient documentation

## 2023-09-11 DIAGNOSIS — Z5112 Encounter for antineoplastic immunotherapy: Secondary | ICD-10-CM | POA: Insufficient documentation

## 2023-09-11 DIAGNOSIS — I2699 Other pulmonary embolism without acute cor pulmonale: Secondary | ICD-10-CM

## 2023-09-11 DIAGNOSIS — Z79899 Other long term (current) drug therapy: Secondary | ICD-10-CM | POA: Insufficient documentation

## 2023-09-11 LAB — URINALYSIS, COMPLETE (UACMP) WITH MICROSCOPIC
Bacteria, UA: NONE SEEN
Bilirubin Urine: NEGATIVE
Glucose, UA: NEGATIVE mg/dL
Hgb urine dipstick: NEGATIVE
Ketones, ur: NEGATIVE mg/dL
Leukocytes,Ua: NEGATIVE
Nitrite: NEGATIVE
Protein, ur: 30 mg/dL — AB
Specific Gravity, Urine: 1.014 (ref 1.005–1.030)
pH: 6 (ref 5.0–8.0)

## 2023-09-11 LAB — CBC WITH DIFFERENTIAL (CANCER CENTER ONLY)
Abs Immature Granulocytes: 0.01 10*3/uL (ref 0.00–0.07)
Basophils Absolute: 0 10*3/uL (ref 0.0–0.1)
Basophils Relative: 0 %
Eosinophils Absolute: 0.1 10*3/uL (ref 0.0–0.5)
Eosinophils Relative: 3 %
HCT: 40.1 % (ref 39.0–52.0)
Hemoglobin: 13.7 g/dL (ref 13.0–17.0)
Immature Granulocytes: 0 %
Lymphocytes Relative: 20 %
Lymphs Abs: 0.8 10*3/uL (ref 0.7–4.0)
MCH: 32.6 pg (ref 26.0–34.0)
MCHC: 34.2 g/dL (ref 30.0–36.0)
MCV: 95.5 fL (ref 80.0–100.0)
Monocytes Absolute: 0.7 10*3/uL (ref 0.1–1.0)
Monocytes Relative: 17 %
Neutro Abs: 2.5 10*3/uL (ref 1.7–7.7)
Neutrophils Relative %: 60 %
Platelet Count: 214 10*3/uL (ref 150–400)
RBC: 4.2 MIL/uL — ABNORMAL LOW (ref 4.22–5.81)
RDW: 14.8 % (ref 11.5–15.5)
WBC Count: 4.2 10*3/uL (ref 4.0–10.5)
nRBC: 0 % (ref 0.0–0.2)

## 2023-09-11 LAB — CMP (CANCER CENTER ONLY)
ALT: 14 U/L (ref 0–44)
AST: 18 U/L (ref 15–41)
Albumin: 3.6 g/dL (ref 3.5–5.0)
Alkaline Phosphatase: 75 U/L (ref 38–126)
Anion gap: 8 (ref 5–15)
BUN: 10 mg/dL (ref 6–20)
CO2: 23 mmol/L (ref 22–32)
Calcium: 8.5 mg/dL — ABNORMAL LOW (ref 8.9–10.3)
Chloride: 109 mmol/L (ref 98–111)
Creatinine: 0.93 mg/dL (ref 0.61–1.24)
GFR, Estimated: >60 mL/min (ref >60)
Glucose, Bld: 112 mg/dL — ABNORMAL HIGH (ref 70–99)
Potassium: 3.8 mmol/L (ref 3.5–5.1)
Sodium: 140 mmol/L (ref 135–145)
Total Bilirubin: 0.6 mg/dL (ref 0.0–1.2)
Total Protein: 7 g/dL (ref 6.5–8.1)

## 2023-09-11 LAB — MAGNESIUM: Magnesium: 1.9 mg/dL (ref 1.7–2.4)

## 2023-09-11 MED ORDER — HEPARIN SOD (PORK) LOCK FLUSH 100 UNIT/ML IV SOLN
500.0000 [IU] | Freq: Once | INTRAVENOUS | Status: AC
Start: 2023-09-11 — End: 2023-09-11
  Administered 2023-09-11: 500 [IU] via INTRAVENOUS
  Filled 2023-09-11: qty 5

## 2023-09-11 NOTE — Assessment & Plan Note (Signed)
Check UA and urine culture.

## 2023-09-11 NOTE — Assessment & Plan Note (Signed)
 Chemotherapy plan as listed

## 2023-09-11 NOTE — Assessment & Plan Note (Signed)
 Continue Eliquis 5mg  BID

## 2023-09-11 NOTE — Progress Notes (Signed)
 Hematology/Oncology Progress note [phone] [phone]      CHIEF COMPLAINTS/REASON FOR VISIT:  Follow up for recurrent rectal cancer treatment  ASSESSMENT & PLAN:   Cancer Staging  Rectal cancer [facility]) Staging form: Colon and Rectum, AJCC 8th Edition - Clinical stage from 02/08/2021: Stage IIIB (cT4a, cN1, cM0) - Signed by Babara Call, MD on 03/06/2021   Rectal cancer [facility]) locally advanced rectal cancer-Stage IIIB, s/p TNT neoadjuvant protocol, concurrent Xeloda  and radiation-Finished 08/27/2021, s/p  APR,rectal adenocarcinoma, ypT3N0. Positive radial margin due to perforation. Biopsy proven recurrent rectal cancer, locally advanced disease, possible colovesical fistula; no distant metastatic disease on PET- per [name] Surgeon Dr. Florie, recurrence is not resectable. --> June 2024 S/p concurrent Xeloda  825 mg/m2 twice daily  with radiation --> July 2024 MRI pelvis w wo contrast showed persistent disease. Disease is not resectable per colorectal surgeon --> FOLFIRI Panitumumab  --> Dec 2025 partial response.  Labs are reviewed and discussed with patient. Patient adamantly did not want to continue 5-FU pump. He also prefers no weekly 5-FU infusion as alternative.  Shared decision was made to switch to Xeloda  with panitumumab . I will omit Irinotecan  due to toxicity concerns combining with Xeloda .  Plan Xeloda  850 mg/m2 twice daily on days 1 to 14 of a 21-day cycle plus Panitumumab  Q3 weeks. [Panel study regimen] Hold treatment today due to dysuria   Acute pulmonary embolism (HCC) Continue Eliquis  5mg  BID  Encounter for antineoplastic chemotherapy Chemotherapy plan as listed  Hypokalemia K is stable continue potassium to 40meq in AM and 20meq in PM.   Hypomagnesemia Recommend patient to continue slow Mag 1 tab daily.  Mag has improved  Dysuria Check UA and urine culture      Orders Placed This Encounter  Procedures   Urine Culture    Standing Status:    Future    Number of Occurrences:   1    Expected Date:   09/11/2023    Expiration Date:   09/10/2024   Magnesium     Standing Status:   Future    Expected Date:   09/18/2023    Expiration Date:   09/17/2024   CEA    Standing Status:   Future    Expected Date:   09/18/2023    Expiration Date:   09/17/2024   CBC with Differential (Cancer Center Only)    Standing Status:   Future    Expected Date:   09/18/2023    Expiration Date:   09/17/2024   CMP (Cancer Center only)    Standing Status:   Future    Expected Date:   09/18/2023    Expiration Date:   09/17/2024   Magnesium     Standing Status:   Future    Expected Date:   09/18/2023    Expiration Date:   09/17/2024   Urinalysis, Complete w Microscopic    Standing Status:   Future    Number of Occurrences:   1    Expected Date:   09/11/2023    Expiration Date:   09/10/2024  Follow-up  1 week All questions were answered. The patient knows to call the clinic with any problems, questions or concerns.  Call Babara, MD, PhD [facility] Hematology Oncology 09/11/2023        HISTORY OF PRESENTING ILLNESS:   Barry Verba. is a  58 y.o.  male presents for rectal cancer Oncology History  Rectal cancer (HCC)  02/08/2021 Initial Diagnosis   Rectal cancer   02/05/2021-02/06/2021 patient  was hospitalized due to generalized weakness, intermittent lightheadedness, weight loss and worsening constipation.  02/05/2021 CT abdomen showed concerning of severe rectal wall thickening and right internal iliac lymph node concerning for metastatic disease.  Patient was seen by gastroenterology and had colonoscopy which showed a circumferential fungating mass in the rectum.  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   02/14/2021-02/16/2021 hospitalized due to rectal bleeding and pain.   02/14/2021 CT showed perirectal fluid collection/gas concerning for infection with significant leukocytosis, anemia with hemoglobin of 6.8.  Patient received PRBC  transfusion, IV antibiotics.  He underwent an IR guided placement of JP drain into the rectal abscess.  Discharged home with oral Augmentin . 02/20/2021 presented to ER with new skin opening and draining from left gluteus.  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  JP drain has been removed.  Patient was continued on Augmentin .   02/13/2021, PET scan showed locally advanced rectal cancer with billowing of the mesorectum now with low attenuation material that was not present on previous examination with extensive stranding and inflammation.   Bulky RIGHT pelvic sidewall/hypogastric lymph node outside of the mesorectum with stippled calcification measuring 19 mm-no increased metabolic activity. Small LEFT hypogastric lymph node just peripheral to the internal external bifurcation-SUV 3.2 High RIGHT internal just below or at the internal/common iliac transition, lymph node -SUV 4.8 LEFT high hypogastric lymph node  8 mm-SUV 3. Scattered lymph nodes throughout the retroperitoneum with low FDG uptake Bilateral inguinal lymph nodes largest on the RIGHT (image 248/3) 11 mm with a maximum SUV of 2.8 spiculated nodule in the LEFT upper lobe- 9 x 8 mm       02/14/2021, CT abdomen pelvis without contrast Showed perforated rectal mass with contained perforation with fluid and gas extending above and below the pelvic floor,potentially involving the sphincter complex and extending into LEFT ischial rectal fossa   02/20/2021, CT pelvis with contrast showed Large perirectal/perianal abscess has markedly decreased in size since placement of the percutaneous drain. There are residual gas-filled collections in the soft tissues and suspect there is a fistula or sinus tract between the rectal mass and the subcutaneous tissues. Soft tissue gas along the medial left buttock and concern for a cutaneous ulceration in this area. Large rectal mass with evidence for a large necrotic right pelvic lymph node.   His case is  complicated with a perirectal abscess. Prior to onset of perirectal abscess, on his 02/05/2021 scan, he was noted to have right internal iliac lymph node 3 x 1 x 2.3 cm which is concerning for nodal disease.On Subsequent images it was difficult to distinguish whether lymphadenopathy was due to nodal disease versus acute inflammation. 03/07/2021 MRI pelvis cT3 N2. Due to the possible contained perforation on previous CT, possible cT4 disease.    02/27/2021 medi port placed by Dr.Dew 03/13/2021 reports right butt cheek and also left perianal area fullness.he was seen by Dr.Pabon urgently  and had right buttock abscess drained. Left perianal fullness was felt to be due to cancer.    02/08/2021 Cancer Staging   Staging form: Colon and Rectum, AJCC 8th Edition - Clinical stage from 02/08/2021: Stage IIIB (cT4a, cN1, cM0) - Signed by Babara Call, MD on 03/06/2021 Stage prefix: Initial diagnosis   02/27/2021 Procedure   medi port placed by Dr.Dew   03/18/2021 - 07/05/2021 Chemotherapy    FOLFOX q14d x 4 months      07/17/2021 - 08/27/2021 Chemotherapy   Xeloda  concurrent with Radiation   11/18/2021 Surgery  patient is status post open APR with flap for rectal cancer. Pathology rectal adenomacarcinoma ypT3N0.  Positive margin: Radial (circumferential) or mesenteric: adjacent to perforation      01/27/2022 - 02/02/2022 Hospital Admission   Hospitalized at [facility] due to recurrent abscess.  US  guided drain into the pelvic abscess with return of 60 ml of purulent fluid. Cultures positive for staph aureus and strep agalactiae group b, fungal cultures negative.    02/25/2022 Imaging   CT chest angiogram with and without contrast, CT abdomen pelvis with and without contrast 1. Negative for acute pulmonary embolus.2. Emphysema. Further decrease in size of the previously noted irregular nodule in the left upper lobe which is now barely measurable today. No new suspicious lung nodules 3. Status post left lower quadrant  colostomy. Further decrease in size since comparison exam from May of the rim enhancing gas and fluid collection at the pelvic surgical bed extending from the perineum superiorly into the pelvis 4. Slightly thickened appearance of terminal ileal small bowel loops in the pelvis with mild stranding suggesting small bowel inflammatory process. 5. Stable enlarged right pelvic sidewall lymph node   06/02/2022 Imaging   CT chest abdomen pelvis w contrast 1. Status post abdominal perineal resection with descending colostomy. 2. At the site of pelvic fluid and gas collection on 02/25/2022, there is residual, decreased presacral soft tissue fullness, without drainable collection. 3. Similar right obturator nodal metastasis.4.  No acute process or evidence of metastatic disease in the chest. 5. Age advanced coronary artery atherosclerosis. Recommend assessment of coronary risk factors. 6. Aortic atherosclerosis and emphysema     06/26/2022 Imaging   CT abdomen pelvis with contrast showed 1. Interval increase in size of a lobulated partially imaged at least 7.8 x 3.8 x 12 cm abscess in the perianal region in a patient status post abdominal perineal resection and left lower lobe end colostomy formation. Finding extends to involve the pre sacral region up to the urinary dome level. Associated posterior urinary bladder wall thickening with lack of intraperitoneal fat plane between the presacral soft tissue thickening/abscess formation suggestive of possible fistulization and invasion of the posterior bladder wall. Underlying recurrent malignancy is not excluded. 2. Stable 2.7 cm right pelvic sidewall lymph node. 3.  Aortic Atherosclerosis   06/26/2022 - 06/27/2022 Hospital Admission   He went to [facility] ER and CT showed recurrent abscess. He was transferred to [facility] due to recurrent abscess, treated with IV vanc/zosyn , IR was consulted and drainage tube was placed. Wound grew up Strep viridans. Patient was seen  by ID at [facility] discharged with Augmentin  for 2 weeks.   He was seen by wound care Dr> Georgie on 07/09/2022, JP drain was removed.    09/29/2022 Imaging   MRI pelvis without contrast showed  Interval abdominoperitoneal resection since prior MRI. Decreased small fluid collection in the surgical bed compared with more recent CT, consistent with resolving postoperative fluid collection or abscess.   Bulky rounded presacral mass, which shows diffuse restricted diffusion, without significant change in size since most recent CT of 06/26/2022. This raises suspicion for recurrent carcinoma over post treatment changes. Recommend correlation with CEA level, and consider tissue sampling or PET-CT.   Stable enlarged right pelvic sidewall lymph node. No new or increased adenopathy identified.   10/09/2022 Imaging   CT chest with contrast showed 1. Unchanged 0.4 cm fissural nodule of the anterior left lower lobe. This is almost certainly a benign fissural lymph node. No new or suspicious pulmonary nodules. 2.  Moderate emphysema and diffuse bilateral bronchial wall thickening. 3. Coronary artery disease.   10/13/2022 Imaging   CT abdomen pelvis with contrast showed 1. Complex collection persists in the lower pelvis, extending from the anal verge upwards into the presacral space and anteriorly from the presacral space to the bladder dome, not significantly changed in size or extent compared to the earlier CT of 06/26/2022. This is most likely a combination of a postoperative seroma and/or chronic phlegmon/abscess versus recurrent abscess. 2. Bladder walls are thick walled/edematous. Given the contiguity of the presacral collection and the bladder dome, this is highly suspicious for a related bladder wall infection and possibly secondary to colovesical fistula. Recommend correlation with urinalysis. 3. No evidence of bowel obstruction. LEFT lower abdominal wall colostomy, without obstruction or inflammatory  change. 4. No free intraperitoneal air   10/13/2022 [facility] Admission   Hospitalized due to fever and rectal pain. Urine culture showed mixed urogenital flora. IR CT guided Aspiration of abscess showed Streptococcal Viridans. He was placed back on Augmentin   10/14/2023 CT read by [facility] radiology Postsurgical changes of abdominoperineal resection. Interval enlargement of  fluid collection in the surgical bed. Multiple foci of enhancing tissue at  the margins of this fluid collection, increased from prior study, suspicious for local recurrence.   Prominent retroperitoneal lymph nodes, some which are increased in size from prior study. These are indeterminate and may be either reactive or represent metastatic disease. Recommend continued attention on follow-up.   Centrally hypoattenuating right obturator lymph node, unchanged in size, consistent with treated disease.   Circumferential bladder wall thickening and irregularity, most pronounced on the left side. This may be reactive in the setting of adjacent pelvic inflammatory changes. Correlate with urinalysis if there is clinical  concern for urinary tract infection.    11/14/2022 Imaging   MRI pelvis w wo contrast  1. Status post abdominoperineal resection with left lower quadrant end colostomy. 2. Compared to prior CT and MR, no significant change in appearance of heterogeneous, rim enhancing presacral and low pelvic soft tissue and fluid. Largest heterogeneously enhancing conglomerate of fluid and soft tissue appears to closely involve the anteriorly abutting the seminal vesicles and measures 3.0 x 2.9 cm in largest axial dimension. This extends to superiorly contact the posterior bladder dome and inferiorly towards the gluteal cleft. 3. Discrete fluid component at the most inferior extent measuring 2.4 x 1.3 cm. This is markedly diminished in volume compared to more remote previous examinations, for example 06/26/2022. 4. Constellation of findings  is highly concerning for locally recurrent rectal malignancy with or without superimposed infection. Presence or absence of infection at this time is not established by MR. 5. Additional unchanged hemorrhagic or proteinaceous fluid collection in the right hemipelvis measuring 3.2 x 2.6 cm most consistent with postoperative hematoma or seroma. 6. Unchanged thickening of the urinary bladder wall. As previously reported, fistula or involvement of bladder wall by malignancy not excluded.   12/09/2022 Procedure   CT guided biopsy of the pelvic mass showed Moderately differentiated adenocarcinoma with dirty necrosis, morphologically identical to patient's prior rectal adenocarcinoma.   [facility] NGS xT 648 panel showed MLH3 start loss,  BCORL1 frame shift, TP53 splice region varian, APC stop gain,  TMB 7.9, MS stable, KRAS/BRAF/NRAS negative.   [facility] NGS xR showed no gene arrangement or reportable altered splicing events in RNA sequencing      01/07/2023 -  Chemotherapy   Xeloda  825 mg/m2 twice daily  with radiation   02/11/2023 Imaging  CT abdomen pelvis w contrast  1. Examination is positive for small bowel obstruction. Transition point is identified within the left iliac fossa. Distal to the transition point the small bowel loops exhibit mucosal enhancement and wall thickening concerning for enteritis. 2. There is new right-sided hydronephrosis and hydroureter up to the level of the bifurcation of right common iliac artery. The distal right ureter appears closely associated with the previously characterized tracer avid presacral soft tissue mass. Cannot exclude obstructive uropathy secondary to tumor involvement. 3. Similar appearance of presacral soft tissue mass. This was tracer avid on the recent PET-CT from 12/17/2022, and concerning for locally recurrent tumor. 4. Unchanged diffuse circumferential wall thickening involving the urinary bladder. 5. Aortic Atherosclerosis    04/17/2023 - 07/29/2023  Chemotherapy   Patient is on Treatment Plan : COLORECTAL FOLFIRI + Panitumumab  q14d     08/14/2023 Imaging   CT chest abdomen pelvis w contrast showed  Heterogeneous central pelvic mass in the area of the rectum overall similar configuration to the previous CT scan. Again the lesion abuts the bladder in the area of the seminal vesicles. Extension up along the presacral space. Surgical changes from diverting colostomy.   Essentially stable right pelvic sidewall soft tissue mass or nodal enlargement. No new nodal enlargement elsewhere in the chest, abdomen or pelvis.   Fatty liver infiltration.   Subtle nodular opacity along the right lung base. Infiltrates possible. Please correlate with symptoms and recommend follow-up.   Incidental right lower lobe segmental and lobar pulmonary embolism.     09/18/2023 -  Chemotherapy   Patient is on Treatment Plan : COLORECTAL Xeloda  + Panitumumab  q21d       Patient presented to emergency room on 12/08/21, rectal pain.  CT scan showed 18.3 cm fluid and gas collection in the pelvic surgical bed compatible with infected collection/abscess.  Patient was sent to [facility] and admitted for.  Patient has CT-guided drain placement on 12/09/2021.  Blood culture positive for Staph epidermidis which was felt to be contaminant.  Fluid culture grew pansensitive Staph aureus, strep pyogenes and candida albicans.  Patient was treated with IV antibiotics and transition to p.o. Augmentin  and fluconazole on 12/11/2021.  #Perirectal abscess status post drainage catheter , patient was recently seen by [facility] oncology surgeon on 12/24/2021 and was recommended to keep drainage catheter and continue antibiotics. Patient had a repeat CT scan done which showed a smaller but persistent abscess.  He will return to [facility] surgery for follow-up of drain removal. His case was discussed on [name] tumor board for the positive mesenteric margin which was felt to be secondary to previous perforation.   Recommend surveillance.  02/11/23 patient was admitted due to nausea, CT showed small bowel obstruction, severe constipation, NG tube was placed. His symptoms improved and has ostomy output, NG was removed and he tolerated PO.   During the interval ER visit due to abdominal pain.  03/02/23 CT abdomen pelvis w contrast showed . Findings are again suggestive of probable partial small bowel obstruction which appears likely related to enteritis. Specifically, there are multiple areas of mural thickening throughout the distal small bowel with increased mucosal and mural enhancement, with some very mild proximal small bowel dilatation. 2. Status post APR with left lower quadrant colostomy and large soft tissue mass in the low anatomic pelvis which is slightly more prominent than the recent prior study, and was previously hypermetabolic on prior PET-CT 12/17/2022, suggesting residual disease. 3. Probable nodal mass along the right pelvic sidewall in  the right internal iliac nodal distribution, similar to the prior study, previously not hypermetabolic on prior PET-CT. No other lymphadenopathy or definitive signs of metastatic disease noted elsewhere on today's examination.  4. Aortic atherosclerosis, as well as calcified atherosclerotic plaque in the right coronary artery. Please note that although the presence of coronary artery calcium  documents the presence of coronary artery disease, the severity of this disease and any potential stenosis cannot be assessed on this non-gated CT examination. Assessment for potential risk factor modification, dietary therapy or pharmacologic therapy may be warranted, if clinically indicated. 5. Additional incidental findings, as above. Patient has radiation induced enteritis. His symptoms were mild he was discharged home.   INTERVAL HISTORY Marcos Ruelas. is a 58 y.o. male who has above history reviewed by me today presents for follow up visit for management of recurrent  locally advanced rectal cancer. Denies fever chills, rectal pain. Denies rectal discharge.  Denies nausea vomiting or abdominal pain.   He takes Eliquis , no bleedings events. No SOB.  He report dysuria today    Review of Systems  Constitutional:  Positive for fatigue. Negative for appetite change, chills, fever and unexpected weight change.  HENT:   Negative for hearing loss and voice change.   Eyes:  Negative for eye problems and icterus.  Respiratory:  Negative for chest tightness, cough and shortness of breath.   Cardiovascular:  Negative for chest pain and leg swelling.  Gastrointestinal:  Negative for abdominal distention, abdominal pain, blood in stool, constipation, diarrhea and rectal pain.  Endocrine: Negative for hot flashes.  Genitourinary:  Negative for difficulty urinating, dysuria and frequency.   Musculoskeletal:  Negative for arthralgias.  Skin:  Negative for itching and rash.  Neurological:  Negative for light-headedness and numbness.  Hematological:  Negative for adenopathy. Does not bruise/bleed easily.  Psychiatric/Behavioral:  Negative for confusion.     MEDICAL HISTORY:  Past Medical History:  Diagnosis Date   Headache    Left shoulder pain    Rectal cancer (HCC)     SURGICAL HISTORY: Past Surgical History:  Procedure Laterality Date   COLON SURGERY     COLONOSCOPY WITH PROPOFOL  N/A 02/06/2021   Procedure: COLONOSCOPY WITH PROPOFOL ;  Surgeon: Unk Corinn Skiff, MD;  Location: [facility] ENDOSCOPY;  Service: Gastroenterology;  Laterality: N/A;   PORTA CATH INSERTION N/A 02/27/2021   Procedure: PORTA CATH INSERTION;  Surgeon: Marea Selinda RAMAN, MD;  Location: [facility] INVASIVE CV LAB;  Service: Cardiovascular;  Laterality: N/A;   ROTATOR CUFF REPAIR Left     SOCIAL HISTORY: Social History   Socioeconomic History   Marital status: Married    Spouse name: Not on file   Number of children: Not on file   Years of education: Not on file   Highest education level:  Not on file  Occupational History   Not on file  Tobacco Use   Smoking status: Former    Current packs/day: 0.00    Average packs/day: 1 pack/day for 10.0 years (10.0 ttl pk-yrs)    Types: Cigars, Cigarettes    Start date: 02/05/2011    Quit date: 02/04/2021    Years since quitting: 2.6   Smokeless tobacco: Never  Substance and Sexual Activity   Alcohol use: Not Currently   Drug use: No   Sexual activity: Not on file  Other Topics Concern   Not on file  Social History Narrative   Not on file   Social Drivers of Health   Financial Resource Strain: High  Risk (02/13/2023)   Received from [facility], [facility]   Overall Financial Resource Strain (CARDIA)    Difficulty of Paying Living Expenses: Hard  Food Insecurity: No Food Insecurity (02/13/2023)   Received from [facility], [facility]   Hunger Vital Sign    Worried About Running Out of Food in the Last Year: Never true    Ran Out of Food in the Last Year: Never true  Transportation Needs: No Transportation Needs (02/13/2023)   Received from [facility], [facility]   [facility] - Transportation    In the past 12 months, has lack of transportation kept you from medical appointments or from getting medications?: No    Lack of Transportation (Non-Medical): No  Physical Activity: Inactive (11/03/2022)   Exercise Vital Sign    Days of Exercise per Week: 0 days    Minutes of Exercise per Session: 0 min  Stress: Stress Concern Present (11/03/2022)   [employer] - Occupational Stress Questionnaire    Feeling of Stress : Rather much  Social Connections: Moderately Isolated (11/03/2022)   Social Connection and Isolation Panel [NHANES]    Frequency of Communication with Friends and Family: Twice a week    Frequency of Social Gatherings with Friends and Family: Three times a week    Attends Religious  Services: 1 to 4 times per year    Active Member of Clubs or Organizations: No    Attends Banker Meetings: Never    Marital Status: Separated  Intimate Partner Violence: Not At Risk (11/03/2022)   Humiliation, Afraid, Rape, and Kick questionnaire    Fear of Current or Ex-Partner: No    Emotionally Abused: No    Physically Abused: No    Sexually Abused: No    FAMILY HISTORY: Family History  Problem Relation Age of Onset   Cancer Sister    Diabetes Mother    Cancer Maternal Grandmother    Cancer Paternal Grandmother     ALLERGIES:  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  MEDICATIONS:  Current Outpatient Medications  Medication Sig Dispense Refill   APIXABAN  (ELIQUIS ) VTE STARTER PACK (10MG  AND 5MG ) Take as directed on package: start with two-5mg  tablets twice daily for 7 days. On day 8, switch to one-5mg  tablet twice daily. 74 each 0   calcium -vitamin D  (OSCAL WITH D) 500-5 MG-MCG tablet Take 2 tablets by mouth daily. 60 tablet 3   capecitabine  (XELODA ) 150 MG tablet Take 1 tablet (150 mg total) by mouth 2 (two) times daily after a meal. Take 14 days on, 7 days off, repeat every 21 days. Take along with capecitabine  500mg  prescription. 28 tablet 1   capecitabine  (XELODA ) 500 MG tablet Take 3 tablets (1,500 mg total) by mouth 2 (two) times daily after a meal. Take 14 days on, 7 days off, repeat every 21 days. Take along with capecitabine  150mg  prescription. 84 tablet 1   citalopram  (CELEXA ) 10 MG tablet Take 1 tablet (10 mg total) by mouth daily. 30 tablet 3   clindamycin  (CLINDAGEL ) 1 % gel Apply topically 2 (two) times daily. 30 g 4   lidocaine -prilocaine  (EMLA ) cream Apply 1 Application topically as needed. Apply small amount to port and cover with saran wrap 1-2 hours prior to port access 30 g 2   loperamide  (IMODIUM ) 2 MG capsule Take 1 capsule (2 mg total) by mouth See admin instructions. 90 capsule 2   magnesium  chloride (  SLOW-MAG) 64 MG TBEC SR tablet Take 1 tablet (64  mg total) by mouth daily. 30 tablet 1   ondansetron  (ZOFRAN ) 8 MG tablet Take 1 tablet (8 mg total) by mouth every 8 (eight) hours as needed for nausea or vomiting. 90 tablet 1   potassium chloride  SA (KLOR-CON  M) 20 MEQ tablet Take 2 tablets (40 mEq total) by mouth in the morning and take 1 tablet ( ) in the evening. 60 tablet 2   senna (SENOKOT) 8.6 MG TABS tablet Take 2 tablets (17.2 mg total) by mouth daily. 100 tablet 0   traZODone  (DESYREL ) 50 MG tablet Take 1 tablet (50 mg total) by mouth at bedtime as needed for sleep. 30 tablet 3   nicotine  (NICODERM CQ  - DOSED IN MG/24 HR) 7 mg/24hr patch Place 1 patch (7 mg total) onto the skin daily. (Patient not taking: Reported on 06/01/2023) 28 patch 2   oxyCODONE  (ROXICODONE ) 5 MG immediate release tablet Take 1-2 tablets (5-10 mg total) by mouth every 6 (six) hours as needed for severe pain. (Patient not taking: Reported on 07/13/2023) 90 tablet 0   prochlorperazine  (COMPAZINE ) 10 MG tablet Take 1 tablet (10 mg total) by mouth every 6 (six) hours as needed for nausea or vomiting. (Patient not taking: Reported on 09/11/2023) 90 tablet 1   No current facility-administered medications for this visit.   Facility-Administered Medications Ordered in Other Visits  Medication Dose Route Frequency Provider Last Rate Last Admin   heparin  lock flush 100 unit/mL  500 Units Intravenous Once Babara Call, MD         PHYSICAL EXAMINATION: ECOG PERFORMANCE STATUS: 1 - Symptomatic but completely ambulatory Vitals:   09/11/23 0849  BP: (!) 165/105  Pulse: 86  Temp: (!) 97.3 F (36.3 C)  SpO2: 100%     There were no vitals filed for this visit.     Physical Exam HENT:     Head: Normocephalic and atraumatic.  Eyes:     General: No scleral icterus. Cardiovascular:     Rate and Rhythm: Normal rate and regular rhythm.     Heart sounds: Normal heart sounds.  Pulmonary:     Effort: Pulmonary effort is normal. No respiratory distress.     Breath  sounds: No wheezing.     Comments: Decreased breath sound bilaterally.  Abdominal:     General: Bowel sounds are normal. There is no distension.     Palpations: Abdomen is soft.     Comments: Colostomy bag  Genitourinary:    Comments: Status post APR, Musculoskeletal:        General: No deformity. Normal range of motion.     Cervical back: Normal range of motion and neck supple.  Skin:    General: Skin is warm and dry.     Findings: No erythema.     Comments: Acne on face.   Neurological:     Mental Status: He is alert and oriented to person, place, and time. Mental status is at baseline.     Cranial Nerves: No cranial nerve deficit.  Psychiatric:        Mood and Affect: Mood normal.     LABORATORY DATA:  I have reviewed the data as listed    Latest Ref Rng & Units 09/11/2023    8:31 AM 08/25/2023   10:32 AM 07/27/2023    8:05 AM  CBC  WBC 4.0 - 10.5 K/uL 4.2  5.2  3.3   Hemoglobin 13.0 - 17.0 g/dL 13.7  13.1  12.6   Hematocrit 39.0 - 52.0 % 40.1  37.7  36.0   Platelets 150 - 400 K/uL 214  214  184       Latest Ref Rng & Units 09/11/2023    8:31 AM 08/25/2023   10:32 AM 07/27/2023    8:05 AM  CMP  Glucose 70 - 99 mg/dL 887  875  835   BUN 6 - 20 mg/dL 10  6  8    Creatinine 0.61 - 1.24 mg/dL 9.06  9.06  8.86   Sodium 135 - 145 mmol/L 140  138  140   Potassium 3.5 - 5.1 mmol/L 3.8  3.6  3.3   Chloride 98 - 111 mmol/L 109  106  107   CO2 22 - 32 mmol/L 23  22  25    Calcium  8.9 - 10.3 mg/dL 8.5  8.5  8.2   Total Protein 6.5 - 8.1 g/dL 7.0  6.7  6.6   Total Bilirubin 0.0 - 1.2 mg/dL 0.6  0.5  0.5   Alkaline Phos 38 - 126 U/L 75  75  97   AST 15 - 41 U/L 18  21  20    ALT 0 - 44 U/L 14  16  15       RADIOGRAPHIC STUDIES: I have personally reviewed the radiological images as listed and agreed with the findings in the report. CT CHEST ABDOMEN PELVIS W CONTRAST Result Date: 08/14/2023 CLINICAL DATA:  Rectal cancer staging.  * Tracking Code: BO *. EXAM: CT CHEST, ABDOMEN,  AND PELVIS WITH CONTRAST TECHNIQUE: Multidetector CT imaging of the chest, abdomen and pelvis was performed following the standard protocol during bolus administration of intravenous contrast. RADIATION DOSE REDUCTION: This exam was performed according to the departmental dose-optimization program which includes automated exposure control, adjustment of the mA and/or kV according to patient size and/or use of iterative reconstruction technique. CONTRAST:  85mL OMNIPAQUE  IOHEXOL  300 MG/ML  SOLN COMPARISON:  Pelvic MRI 03/30/2023. Abdomen and pelvis CT 03/02/2023 and older. PET-CT scan 12/17/2022. FINDINGS: CT CHEST FINDINGS Cardiovascular: Right upper chest port in place with the tip seen as far as the right atrium. The port is accessed. Heart is nonenlarged. No pericardial effusion. Thoracic aorta has a normal course and caliber with slight atherosclerotic change. Note is made of right lower lobe pulmonary embolism in the anterior lower lobe such as series 2, image 35. Small amount of clot along the lobar vessels as well as seen on coronal image 75. No larger or additional central emboli identified. Mediastinum/Nodes: Slightly patulous thoracic esophagus. Preserved thyroid gland. No discrete abnormal lymph node enlargement present in the axillary regions, hilum or mediastinum. Lungs/Pleura: Centrilobular emphysematous changes identified. No pneumothorax. There is breathing motion. There is some patchy opacity identified along the right lower lobe. Atelectasis versus infiltrates possible. Recommend correlation with a particular symptoms. Follow up as clinically directed. There is a small nodule along the margin of the left interlobar fissure measuring 5 mm. Essentially unchanged from a chest CT of 10/09/2022. This has been stable since at least September 2023 as well. Musculoskeletal: Scattered degenerative changes. CT ABDOMEN PELVIS FINDINGS Hepatobiliary: Fatty liver infiltration. Patent portal vein. Gallbladder is  present low-attenuation lesion posteroinferior along the right hepatic lobe previously measuring 13 mm is stable today on series 2, image 67 again likely a benign cystic lesion. Otherwise no clear space-occupying liver lesion at this time. Pancreas: Moderate atrophy of the pancreas without obvious mass. Spleen: Small spleen. Adrenals/Urinary Tract: Stable slight nonspecific  thickening of the adrenal glands. No enhancing renal mass or collecting system dilatation. The ureters have normal course and caliber extending down to the underdistended urinary bladder. There are some tiny low-attenuation foci along the kidneys which are too small to completely characterize but likely benign cystic foci, Bosniak 2 lesions. No specific imaging follow-up of these renal foci Stomach/Bowel: Oral contrast was administered. The stomach is underdistended. Small bowel is nondilated. 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 Proximal this there is moderate colonic stool. The colon is nondilated. Normal retrocecal appendix. In the area of the rectum is a focal heterogeneous mass abutting the posterior aspect of the urinary bladder. In the axial plane previous study measured of the lesion at 5.3 x 4.9 cm. When measuring in the same fashion at a similar level today 5.6 by 4.6 cm, similar. This abuts the adjacent musculature laterally. Cephalocaudal extension in the presacral space approaches 10.3 cm today and going back to the prior when measured in the same fashion 9.8 cm. Also abuts some small bowel loops in the central pelvis posteriorly. Involvement of the seminal vesicles is possible. Vascular/Lymphatic: Diffuse vascular calcifications. Normal caliber aorta and IVC. Circumaortic left renal vein. No new lymph node enlargement identified in the upper abdomen. The right pelvic sidewall lesion previously measuring 2.8 x 3.2 cm, today on series 2, image 112 measures 3.3 x 2.6 cm. Similar. Reproductive: Poor definition of the prostate and  seminal vesicles. Other: Stranding of the fat in the pelvis and lower abdomen. No free intra-air. No loculated fluid collections clearly seen. Musculoskeletal: Curvature of the spine. Scattered degenerative changes of the spine and pelvis. Scattered Schmorl's node changes. IMPRESSION: Heterogeneous central pelvic mass in the area of the rectum overall similar configuration to the previous CT scan. Again the lesion abuts the bladder in the area of the seminal vesicles. Extension up along the presacral space. Surgical changes from diverting colostomy. Essentially stable right pelvic sidewall soft tissue mass or nodal enlargement. No new nodal enlargement elsewhere in the chest, abdomen or pelvis. Fatty liver infiltration. Subtle nodular opacity along the right lung base. Infiltrates possible. Please correlate with symptoms and recommend follow-up. Incidental right lower lobe segmental and lobar pulmonary embolism. Critical Value/emergent results were called by telephone at the time of interpretation on 08/14/2023 at 5:58 pm to provider Dr. Jacobo, who verbally acknowledged these results. Electronically Signed   By: Ranell Bring M.D.   On: 08/14/2023 17:59

## 2023-09-11 NOTE — Assessment & Plan Note (Signed)
 K is stable continue potassium to in AM and in PM.

## 2023-09-11 NOTE — Assessment & Plan Note (Signed)
 Recommend patient to continue slow Mag 1 tab daily.  Mag has improved

## 2023-09-12 LAB — URINE CULTURE: Culture: NO GROWTH

## 2023-09-12 LAB — CEA: CEA: 1.6 ng/mL (ref 0.0–4.7)

## 2023-09-17 ENCOUNTER — Other Ambulatory Visit: Payer: Self-pay | Admitting: Oncology

## 2023-09-17 ENCOUNTER — Telehealth: Payer: Self-pay | Admitting: Oncology

## 2023-09-17 NOTE — Telephone Encounter (Signed)
 Patient has no ride to get home from treatment tomorrow. He states he can come any day next week Monday preferred- please advise reschedule.  Thank you

## 2023-09-18 ENCOUNTER — Inpatient Hospital Stay: Payer: Medicare Other

## 2023-09-18 ENCOUNTER — Encounter: Payer: Self-pay | Admitting: Oncology

## 2023-09-18 ENCOUNTER — Inpatient Hospital Stay: Payer: Medicare Other | Admitting: Oncology

## 2023-09-23 ENCOUNTER — Other Ambulatory Visit: Payer: Self-pay

## 2023-09-23 ENCOUNTER — Inpatient Hospital Stay: Payer: Medicare Other

## 2023-09-23 ENCOUNTER — Inpatient Hospital Stay (HOSPITAL_BASED_OUTPATIENT_CLINIC_OR_DEPARTMENT_OTHER): Payer: Medicare Other | Admitting: Oncology

## 2023-09-23 ENCOUNTER — Other Ambulatory Visit: Payer: Medicare Other

## 2023-09-23 ENCOUNTER — Ambulatory Visit: Payer: Medicare Other | Admitting: Oncology

## 2023-09-23 ENCOUNTER — Encounter: Payer: Self-pay | Admitting: Oncology

## 2023-09-23 ENCOUNTER — Ambulatory Visit: Payer: Medicare Other

## 2023-09-23 VITALS — BP 151/104 | HR 89 | Temp 96.0°F | Resp 18 | Wt 183.8 lb

## 2023-09-23 VITALS — BP 166/114 | HR 83

## 2023-09-23 DIAGNOSIS — C20 Malignant neoplasm of rectum: Secondary | ICD-10-CM

## 2023-09-23 DIAGNOSIS — Z923 Personal history of irradiation: Secondary | ICD-10-CM | POA: Diagnosis not present

## 2023-09-23 DIAGNOSIS — E876 Hypokalemia: Secondary | ICD-10-CM

## 2023-09-23 DIAGNOSIS — I10 Essential (primary) hypertension: Secondary | ICD-10-CM | POA: Diagnosis not present

## 2023-09-23 DIAGNOSIS — Z933 Colostomy status: Secondary | ICD-10-CM | POA: Diagnosis not present

## 2023-09-23 DIAGNOSIS — R300 Dysuria: Secondary | ICD-10-CM | POA: Diagnosis not present

## 2023-09-23 DIAGNOSIS — Z5112 Encounter for antineoplastic immunotherapy: Secondary | ICD-10-CM | POA: Diagnosis not present

## 2023-09-23 DIAGNOSIS — Z7901 Long term (current) use of anticoagulants: Secondary | ICD-10-CM | POA: Diagnosis not present

## 2023-09-23 DIAGNOSIS — Z79899 Other long term (current) drug therapy: Secondary | ICD-10-CM | POA: Diagnosis not present

## 2023-09-23 DIAGNOSIS — I2699 Other pulmonary embolism without acute cor pulmonale: Secondary | ICD-10-CM

## 2023-09-23 DIAGNOSIS — Z5111 Encounter for antineoplastic chemotherapy: Secondary | ICD-10-CM

## 2023-09-23 DIAGNOSIS — Z86711 Personal history of pulmonary embolism: Secondary | ICD-10-CM | POA: Diagnosis not present

## 2023-09-23 DIAGNOSIS — Z87891 Personal history of nicotine dependence: Secondary | ICD-10-CM | POA: Diagnosis not present

## 2023-09-23 LAB — CBC WITH DIFFERENTIAL (CANCER CENTER ONLY)
Abs Immature Granulocytes: 0.02 10*3/uL (ref 0.00–0.07)
Basophils Absolute: 0 10*3/uL (ref 0.0–0.1)
Basophils Relative: 1 %
Eosinophils Absolute: 0.1 10*3/uL (ref 0.0–0.5)
Eosinophils Relative: 2 %
HCT: 41.1 % (ref 39.0–52.0)
Hemoglobin: 14.3 g/dL (ref 13.0–17.0)
Immature Granulocytes: 1 %
Lymphocytes Relative: 25 %
Lymphs Abs: 1.1 10*3/uL (ref 0.7–4.0)
MCH: 32.7 pg (ref 26.0–34.0)
MCHC: 34.8 g/dL (ref 30.0–36.0)
MCV: 94.1 fL (ref 80.0–100.0)
Monocytes Absolute: 0.7 10*3/uL (ref 0.1–1.0)
Monocytes Relative: 16 %
Neutro Abs: 2.4 10*3/uL (ref 1.7–7.7)
Neutrophils Relative %: 55 %
Platelet Count: 175 10*3/uL (ref 150–400)
RBC: 4.37 MIL/uL (ref 4.22–5.81)
RDW: 14.1 % (ref 11.5–15.5)
Smear Review: ADEQUATE
WBC Count: 4.3 10*3/uL (ref 4.0–10.5)
nRBC: 0 % (ref 0.0–0.2)

## 2023-09-23 LAB — CMP (CANCER CENTER ONLY)
ALT: 16 U/L (ref 0–44)
AST: 18 U/L (ref 15–41)
Albumin: 3.8 g/dL (ref 3.5–5.0)
Alkaline Phosphatase: 75 U/L (ref 38–126)
Anion gap: 8 (ref 5–15)
BUN: 9 mg/dL (ref 6–20)
CO2: 23 mmol/L (ref 22–32)
Calcium: 8.8 mg/dL — ABNORMAL LOW (ref 8.9–10.3)
Chloride: 107 mmol/L (ref 98–111)
Creatinine: 0.93 mg/dL (ref 0.61–1.24)
GFR, Estimated: >60 mL/min (ref >60)
Glucose, Bld: 134 mg/dL — ABNORMAL HIGH (ref 70–99)
Potassium: 3.9 mmol/L (ref 3.5–5.1)
Sodium: 138 mmol/L (ref 135–145)
Total Bilirubin: 0.6 mg/dL (ref 0.0–1.2)
Total Protein: 7.1 g/dL (ref 6.5–8.1)

## 2023-09-23 LAB — MAGNESIUM: Magnesium: 1.9 mg/dL (ref 1.7–2.4)

## 2023-09-23 MED ORDER — APIXABAN 5 MG PO TABS
5.0000 mg | ORAL_TABLET | Freq: Two times a day (BID) | ORAL | 1 refills | Status: DC
  Filled 2023-09-23: qty 60, 30d supply, fill #0

## 2023-09-23 MED ORDER — HEPARIN SOD (PORK) LOCK FLUSH 100 UNIT/ML IV SOLN
500.0000 [IU] | Freq: Once | INTRAVENOUS | Status: AC | PRN
  Administered 2023-09-23: 500 [IU]
  Filled 2023-09-23: qty 5

## 2023-09-23 MED ORDER — SODIUM CHLORIDE 0.9 % IV SOLN
9.0000 mg/kg | Freq: Once | INTRAVENOUS | Status: AC
  Administered 2023-09-23: 700 mg via INTRAVENOUS
  Filled 2023-09-23: qty 15

## 2023-09-23 MED ORDER — SODIUM CHLORIDE 0.9% FLUSH
10.0000 mL | INTRAVENOUS | Status: DC | PRN
  Administered 2023-09-23: 10 mL
  Filled 2023-09-23: qty 10

## 2023-09-23 MED ORDER — SODIUM CHLORIDE 0.9 % IV SOLN
INTRAVENOUS | Status: DC
  Filled 2023-09-23: qty 250

## 2023-09-23 NOTE — Assessment & Plan Note (Signed)
 Recommend patient to continue slow Mag 1 tab daily.  Mag has improved

## 2023-09-23 NOTE — Assessment & Plan Note (Signed)
 K is stable continue potassium to in AM and in PM.

## 2023-09-23 NOTE — Progress Notes (Signed)
 Patient noted to have elevated BP today 170/111, 165/106, 151/104, and 166/114.  Pt does not take BP medication at home.  MD notified.  Okay to proceed with treatment today despite elevated BP.  Pt encouraged to follow up with PCP regarding hypertension.  Pt verbalized understanding.  No complaints noted while patient was in infusion suite today.

## 2023-09-23 NOTE — Progress Notes (Signed)
 Hematology/Oncology Progress note [phone] [phone]      CHIEF COMPLAINTS/REASON FOR VISIT:  Follow up for recurrent rectal cancer treatment  ASSESSMENT & PLAN:   Cancer Staging  Rectal cancer [facility]) Staging form: Colon and Rectum, AJCC 8th Edition - Clinical stage from 02/08/2021: Stage IIIB (cT4a, cN1, cM0) - Signed by [name], MD on 03/06/2021   Rectal cancer [facility]) locally advanced rectal cancer-Stage IIIB, s/p TNT neoadjuvant protocol, concurrent Xeloda  and radiation-Finished 08/27/2021, s/p  APR,rectal adenocarcinoma, ypT3N0. Positive radial margin due to perforation. Biopsy proven recurrent rectal cancer, locally advanced disease, possible colovesical fistula; no distant metastatic disease on PET- per [name] Surgeon Dr. Lenore Rafter, recurrence is not resectable. --> June 2024 S/p concurrent Xeloda  825 mg/m2 twice daily  with radiation --> July 2024 MRI pelvis w wo contrast showed persistent disease. Disease is not resectable per colorectal surgeon --> FOLFIRI Panitumumab  --> Dec 2025 partial response.  Labs are reviewed and discussed with patient. Patient adamantly did not want to continue 5-FU pump. He also prefers no weekly 5-FU infusion as alternative.  Shared decision was made to switch to Xeloda  with panitumumab . I will omit Irinotecan  due to toxicity concerns combining with Xeloda .  Plan Xeloda  850 mg/m2 twice daily on days 1 to 14 of a 21-day cycle plus Panitumumab  Q3 weeks. [Panel study regimen]. Rationale and side effects were reviewed with patient and he agrees.  Proceed with treatment.    Acute pulmonary embolism (HCC) Continue Eliquis  5mg  BID  Encounter for antineoplastic chemotherapy Chemotherapy plan as listed  Hypokalemia K is stable continue potassium to 40meq in AM and 20meq in PM.   Hypomagnesemia Recommend patient to continue slow Mag 1 tab daily.  Mag has improved  Hypertension His diastolic BP is chronically elevated.  Discussed  about starting BP medication and he declines.  I recommend him to further discuss with PCP      Orders Placed This Encounter  Procedures   CMP (Cancer Center only)    Standing Status:   Future    Expected Date:   10/02/2023    Expiration Date:   09/22/2024  Follow-up  1 week All questions were answered. The patient knows to call the clinic with any problems, questions or concerns.  [name], MD, PhD [facility] Hematology Oncology 09/23/2023        HISTORY OF PRESENTING ILLNESS:   Barry [name]. is a  58 y.o.  male presents for rectal cancer Oncology History  Rectal cancer (HCC)  02/08/2021 Initial Diagnosis   Rectal cancer   02/05/2021-02/06/2021 patient was hospitalized due to generalized weakness, intermittent lightheadedness, weight loss and worsening constipation.  02/05/2021 CT abdomen showed concerning of severe rectal wall thickening and right internal iliac lymph node concerning for metastatic disease.  Patient was seen by gastroenterology and had colonoscopy which showed a circumferential fungating mass in the rectum.  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   02/14/2021-02/16/2021 hospitalized due to rectal bleeding and pain.   02/14/2021 CT showed perirectal fluid collection/gas concerning for infection with significant leukocytosis, anemia with hemoglobin of 6.8.  Patient received PRBC transfusion, IV antibiotics.  He underwent an IR guided placement of JP drain into the rectal abscess.  Discharged home with oral Augmentin . 02/20/2021 presented to ER with new skin opening and draining from left gluteus.  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  JP drain has been removed.  Patient was continued on Augmentin .   02/13/2021, PET scan showed locally advanced rectal cancer with  billowing of the mesorectum now with low attenuation material that was not present on previous examination with extensive stranding and inflammation.   Bulky RIGHT pelvic  sidewall/hypogastric lymph node outside of the mesorectum with stippled calcification measuring 19 mm-no increased metabolic activity. Small LEFT hypogastric lymph node just peripheral to the internal external bifurcation-SUV 3.2 High RIGHT internal just below or at the internal/common iliac transition, lymph node -SUV 4.8 LEFT high hypogastric lymph node  8 mm-SUV 3. Scattered lymph nodes throughout the retroperitoneum with low FDG uptake Bilateral inguinal lymph nodes largest on the RIGHT (image 248/3) 11 mm with a maximum SUV of 2.8 spiculated nodule in the LEFT upper lobe- 9 x 8 mm       02/14/2021, CT abdomen pelvis without contrast Showed perforated rectal mass with contained perforation with fluid and gas extending above and below the pelvic floor,potentially involving the sphincter complex and extending into LEFT ischial rectal fossa   02/20/2021, CT pelvis with contrast showed Large perirectal/perianal abscess has markedly decreased in size since placement of the percutaneous drain. There are residual gas-filled collections in the soft tissues and suspect there is a fistula or sinus tract between the rectal mass and the subcutaneous tissues. Soft tissue gas along the medial left buttock and concern for a cutaneous ulceration in this area. Large rectal mass with evidence for a large necrotic right pelvic lymph node.   His case is complicated with a perirectal abscess. Prior to onset of perirectal abscess, on his 02/05/2021 scan, he was noted to have right internal iliac lymph node 3 x 1 x 2.3 cm which is concerning for nodal disease.On Subsequent images it was difficult to distinguish whether lymphadenopathy was due to nodal disease versus acute inflammation. 03/07/2021 MRI pelvis cT3 N2. Due to the possible contained perforation on previous CT, possible cT4 disease.    02/27/2021 medi port placed by Dr.Dew 03/13/2021 reports right butt cheek and also left perianal area fullness.he was seen by  Dr.Pabon urgently  and had right buttock abscess drained. Left perianal fullness was felt to be due to cancer.    02/08/2021 Cancer Staging   Staging form: Colon and Rectum, AJCC 8th Edition - Clinical stage from 02/08/2021: Stage IIIB (cT4a, cN1, cM0) - Signed by [name], MD on 03/06/2021 Stage prefix: Initial diagnosis   02/27/2021 Procedure   medi port placed by Dr.Dew   03/18/2021 - 07/05/2021 Chemotherapy    FOLFOX q14d x 4 months      07/17/2021 - 08/27/2021 Chemotherapy   Xeloda  concurrent with Radiation   11/18/2021 Surgery   patient is status post open APR with flap for rectal cancer. Pathology rectal adenomacarcinoma ypT3N0.  Positive margin: Radial (circumferential) or mesenteric: adjacent to perforation      01/27/2022 - 02/02/2022 Hospital Admission   Hospitalized at [facility] due to recurrent abscess.  US  guided drain into the pelvic abscess with return of 60 ml of purulent fluid. Cultures positive for staph aureus and strep agalactiae group b, fungal cultures negative.    02/25/2022 Imaging   CT chest angiogram with and without contrast, CT abdomen pelvis with and without contrast 1. Negative for acute pulmonary embolus.2. Emphysema. Further decrease in size of the previously noted irregular nodule in the left upper lobe which is now barely measurable today. No new suspicious lung nodules 3. Status post left lower quadrant colostomy. Further decrease in size since comparison exam from May of the rim enhancing gas and fluid collection at the pelvic surgical bed extending from the  perineum superiorly into the pelvis 4. Slightly thickened appearance of terminal ileal small bowel loops in the pelvis with mild stranding suggesting small bowel inflammatory process. 5. Stable enlarged right pelvic sidewall lymph node   06/02/2022 Imaging   CT chest abdomen pelvis w contrast 1. Status post abdominal perineal resection with descending colostomy. 2. At the site of pelvic fluid and gas  collection on 02/25/2022, there is residual, decreased presacral soft tissue fullness, without drainable collection. 3. Similar right obturator nodal metastasis.4.  No acute process or evidence of metastatic disease in the chest. 5. Age advanced coronary artery atherosclerosis. Recommend assessment of coronary risk factors. 6. Aortic atherosclerosis and emphysema     06/26/2022 Imaging   CT abdomen pelvis with contrast showed 1. Interval increase in size of a lobulated partially imaged at least 7.8 x 3.8 x 12 cm abscess in the perianal region in a patient status post abdominal perineal resection and left lower lobe end colostomy formation. Finding extends to involve the pre sacral region up to the urinary dome level. Associated posterior urinary bladder wall thickening with lack of intraperitoneal fat plane between the presacral soft tissue thickening/abscess formation suggestive of possible fistulization and invasion of the posterior bladder wall. Underlying recurrent malignancy is not excluded. 2. Stable 2.7 cm right pelvic sidewall lymph node. 3.  Aortic Atherosclerosis   06/26/2022 - 06/27/2022 Hospital Admission   He went to [facility] ER and CT showed recurrent abscess. He was transferred to [facility] due to recurrent abscess, treated with IV vanc/zosyn , IR was consulted and drainage tube was placed. Wound grew up Strep viridans. Patient was seen by ID at [facility] discharged with Augmentin  for 2 weeks.   He was seen by wound care Dr> Miki Alert on 07/09/2022, JP drain was removed.    09/29/2022 Imaging   MRI pelvis without contrast showed  Interval abdominoperitoneal resection since prior MRI. Decreased small fluid collection in the surgical bed compared with more recent CT, consistent with resolving postoperative fluid collection or abscess.   Bulky rounded presacral mass, which shows diffuse restricted diffusion, without significant change in size since most recent CT of 06/26/2022. This raises  suspicion for recurrent carcinoma over post treatment changes. Recommend correlation with CEA level, and consider tissue sampling or PET-CT.   Stable enlarged right pelvic sidewall lymph node. No new or increased adenopathy identified.   10/09/2022 Imaging   CT chest with contrast showed 1. Unchanged 0.4 cm fissural nodule of the anterior left lower lobe. This is almost certainly a benign fissural lymph node. No new or suspicious pulmonary nodules. 2. Moderate emphysema and diffuse bilateral bronchial wall thickening. 3. Coronary artery disease.   10/13/2022 Imaging   CT abdomen pelvis with contrast showed 1. Complex collection persists in the lower pelvis, extending from the anal verge upwards into the presacral space and anteriorly from the presacral space to the bladder dome, not significantly changed in size or extent compared to the earlier CT of 06/26/2022. This is most likely a combination of a postoperative seroma and/or chronic phlegmon/abscess versus recurrent abscess. 2. Bladder walls are thick walled/edematous. Given the contiguity of the presacral collection and the bladder dome, this is highly suspicious for a related bladder wall infection and possibly secondary to colovesical fistula. Recommend correlation with urinalysis. 3. No evidence of bowel obstruction. LEFT lower abdominal wall colostomy, without obstruction or inflammatory change. 4. No free intraperitoneal air   10/13/2022 [facility] Admission   Hospitalized due to fever and rectal pain. Urine culture showed  mixed urogenital flora. IR CT guided Aspiration of abscess showed Streptococcal Viridans. He was placed back on Augmentin   10/14/2023 CT read by [facility] radiology Postsurgical changes of abdominoperineal resection. Interval enlargement of  fluid collection in the surgical bed. Multiple foci of enhancing tissue at  the margins of this fluid collection, increased from prior study, suspicious for local recurrence.   Prominent  retroperitoneal lymph nodes, some which are increased in size from prior study. These are indeterminate and may be either reactive or represent metastatic disease. Recommend continued attention on follow-up.   Centrally hypoattenuating right obturator lymph node, unchanged in size, consistent with treated disease.   Circumferential bladder wall thickening and irregularity, most pronounced on the left side. This may be reactive in the setting of adjacent pelvic inflammatory changes. Correlate with urinalysis if there is clinical  concern for urinary tract infection.    11/14/2022 Imaging   MRI pelvis w wo contrast  1. Status post abdominoperineal resection with left lower quadrant end colostomy. 2. Compared to prior CT and MR, no significant change in appearance of heterogeneous, rim enhancing presacral and low pelvic soft tissue and fluid. Largest heterogeneously enhancing conglomerate of fluid and soft tissue appears to closely involve the anteriorly abutting the seminal vesicles and measures 3.0 x 2.9 cm in largest axial dimension. This extends to superiorly contact the posterior bladder dome and inferiorly towards the gluteal cleft. 3. Discrete fluid component at the most inferior extent measuring 2.4 x 1.3 cm. This is markedly diminished in volume compared to more remote previous examinations, for example 06/26/2022. 4. Constellation of findings is highly concerning for locally recurrent rectal malignancy with or without superimposed infection. Presence or absence of infection at this time is not established by MR. 5. Additional unchanged hemorrhagic or proteinaceous fluid collection in the right hemipelvis measuring 3.2 x 2.6 cm most consistent with postoperative hematoma or seroma. 6. Unchanged thickening of the urinary bladder wall. As previously reported, fistula or involvement of bladder wall by malignancy not excluded.   12/09/2022 Procedure   CT guided biopsy of the pelvic mass  showed Moderately differentiated adenocarcinoma with dirty necrosis, morphologically identical to patient's prior rectal adenocarcinoma.   [facility] NGS xT 648 panel showed MLH3 start loss,  BCORL1 frame shift, TP53 splice region varian, APC stop gain,  TMB 7.9, MS stable, KRAS/BRAF/NRAS negative.   [facility] NGS xR showed no gene arrangement or reportable altered splicing events in RNA sequencing      01/07/2023 -  Chemotherapy   Xeloda  825 mg/m2 twice daily  with radiation   02/11/2023 Imaging   CT abdomen pelvis w contrast  1. Examination is positive for small bowel obstruction. Transition point is identified within the left iliac fossa. Distal to the transition point the small bowel loops exhibit mucosal enhancement and wall thickening concerning for enteritis. 2. There is new right-sided hydronephrosis and hydroureter up to the level of the bifurcation of right common iliac artery. The distal right ureter appears closely associated with the previously characterized tracer avid presacral soft tissue mass. Cannot exclude obstructive uropathy secondary to tumor involvement. 3. Similar appearance of presacral soft tissue mass. This was tracer avid on the recent PET-CT from 12/17/2022, and concerning for locally recurrent tumor. 4. Unchanged diffuse circumferential wall thickening involving the urinary bladder. 5. Aortic Atherosclerosis    04/17/2023 - 07/29/2023 Chemotherapy   Patient is on Treatment Plan : COLORECTAL FOLFIRI + Panitumumab  q14d     08/14/2023 Imaging   CT chest abdomen pelvis w contrast  showed  Heterogeneous central pelvic mass in the area of the rectum overall similar configuration to the previous CT scan. Again the lesion abuts the bladder in the area of the seminal vesicles. Extension up along the presacral space. Surgical changes from diverting colostomy.   Essentially stable right pelvic sidewall soft tissue mass or nodal enlargement. No new nodal enlargement elsewhere in  the chest, abdomen or pelvis.   Fatty liver infiltration.   Subtle nodular opacity along the right lung base. Infiltrates possible. Please correlate with symptoms and recommend follow-up.   Incidental right lower lobe segmental and lobar pulmonary embolism.     09/23/2023 -  Chemotherapy   Patient is on Treatment Plan : COLORECTAL Xeloda  + Panitumumab  q21d       Patient presented to emergency room on 12/08/21, rectal pain.  CT scan showed 18.3 cm fluid and gas collection in the pelvic surgical bed compatible with infected collection/abscess.  Patient was sent to [facility] and admitted for.  Patient has CT-guided drain placement on 12/09/2021.  Blood culture positive for Staph epidermidis which was felt to be contaminant.  Fluid culture grew pansensitive Staph aureus, strep pyogenes and candida albicans.  Patient was treated with IV antibiotics and transition to p.o. Augmentin  and fluconazole on 12/11/2021.  #Perirectal abscess status post drainage catheter , patient was recently seen by [facility] oncology surgeon on 12/24/2021 and was recommended to keep drainage catheter and continue antibiotics. Patient had a repeat CT scan done which showed a smaller but persistent abscess.  He will return to [facility] surgery for follow-up of drain removal. His case was discussed on [name] tumor board for the positive mesenteric margin which was felt to be secondary to previous perforation.  Recommend surveillance.  02/11/23 patient was admitted due to nausea, CT showed small bowel obstruction, severe constipation, NG tube was placed. His symptoms improved and has ostomy output, NG was removed and he tolerated PO.   During the interval ER visit due to abdominal pain.  03/02/23 CT abdomen pelvis w contrast showed . Findings are again suggestive of probable partial small bowel obstruction which appears likely related to enteritis. Specifically, there are multiple areas of mural thickening throughout the distal small bowel with  increased mucosal and mural enhancement, with some very mild proximal small bowel dilatation. 2. Status post APR with left lower quadrant colostomy and large soft tissue mass in the low anatomic pelvis which is slightly more prominent than the recent prior study, and was previously hypermetabolic on prior PET-CT 12/17/2022, suggesting residual disease. 3. Probable nodal mass along the right pelvic sidewall in the right internal iliac nodal distribution, similar to the prior study, previously not hypermetabolic on prior PET-CT. No other lymphadenopathy or definitive signs of metastatic disease noted elsewhere on today's examination.  4. Aortic atherosclerosis, as well as calcified atherosclerotic plaque in the right coronary artery. Please note that although the presence of coronary artery calcium  documents the presence of coronary artery disease, the severity of this disease and any potential stenosis cannot be assessed on this non-gated CT examination. Assessment for potential risk factor modification, dietary therapy or pharmacologic therapy may be warranted, if clinically indicated. 5. Additional incidental findings, as above. Patient has radiation induced enteritis. His symptoms were mild he was discharged home.   INTERVAL HISTORY Barry [name]. is a 59 y.o. male who has above history reviewed by me today presents for follow up visit for management of recurrent locally advanced rectal cancer. Denies fever chills, rectal pain. Denies rectal  discharge.  Denies nausea vomiting or abdominal pain.   He takes Eliquis , no bleedings events. No SOB.  Urine culture is negative.  dysuria resolved.     Review of Systems  Constitutional:  Positive for fatigue. Negative for appetite change, chills, fever and unexpected weight change.  HENT:   Negative for hearing loss and voice change.   Eyes:  Negative for eye problems and icterus.  Respiratory:  Negative for chest tightness, cough and shortness of  breath.   Cardiovascular:  Negative for chest pain and leg swelling.  Gastrointestinal:  Negative for abdominal distention, abdominal pain, blood in stool, constipation, diarrhea and rectal pain.  Endocrine: Negative for hot flashes.  Genitourinary:  Negative for difficulty urinating, dysuria and frequency.   Musculoskeletal:  Negative for arthralgias.  Skin:  Negative for itching and rash.  Neurological:  Negative for light-headedness and numbness.  Hematological:  Negative for adenopathy. Does not bruise/bleed easily.  Psychiatric/Behavioral:  Negative for confusion.     MEDICAL HISTORY:  Past Medical History:  Diagnosis Date   Headache    Left shoulder pain    Rectal cancer (HCC)     SURGICAL HISTORY: Past Surgical History:  Procedure Laterality Date   COLON SURGERY     COLONOSCOPY WITH PROPOFOL  N/A 02/06/2021   Procedure: COLONOSCOPY WITH PROPOFOL ;  Surgeon: Selena Daily, MD;  Location: [facility] ENDOSCOPY;  Service: Gastroenterology;  Laterality: N/A;   PORTA CATH INSERTION N/A 02/27/2021   Procedure: PORTA CATH INSERTION;  Surgeon: Celso College, MD;  Location: [facility] INVASIVE CV LAB;  Service: Cardiovascular;  Laterality: N/A;   ROTATOR CUFF REPAIR Left     SOCIAL HISTORY: Social History   Socioeconomic History   Marital status: Married    Spouse name: Not on file   Number of children: Not on file   Years of education: Not on file   Highest education level: Not on file  Occupational History   Not on file  Tobacco Use   Smoking status: Former    Current packs/day: 0.00    Average packs/day: 1 pack/day for 10.0 years (10.0 ttl pk-yrs)    Types: Cigars, Cigarettes    Start date: 02/05/2011    Quit date: 02/04/2021    Years since quitting: 2.6   Smokeless tobacco: Never  Substance and Sexual Activity   Alcohol use: Not Currently   Drug use: No   Sexual activity: Not on file  Other Topics Concern   Not on file  Social History Narrative   Not on file   Social  Drivers of Health   Financial Resource Strain: High Risk (02/13/2023)   Received from [facility], [facility]   Overall Financial Resource Strain (CARDIA)    Difficulty of Paying Living Expenses: Hard  Food Insecurity: No Food Insecurity (02/13/2023)   [facility], [facility]   Hunger Vital Sign    Worried About Running Out of Food in the Last Year: Never true    Ran Out of Food in the Last Year: Never true  Transportation Needs: No Transportation Needs (02/13/2023)   Received from [facility], [facility]   [facility] - Transportation    In the past 12 months, has lack of transportation kept you from medical appointments or from getting medications?: No    Lack of Transportation (Non-Medical): No  Physical Activity: Inactive (11/03/2022)   Exercise Vital Sign    Days of Exercise per  Week: 0 days    Minutes of Exercise per Session: 0 min  Stress: Stress Concern Present (11/03/2022)   [employer] of Occupational Health - Occupational Stress Questionnaire    Feeling of Stress : Rather much  Social Connections: Moderately Isolated (11/03/2022)   Social Connection and Isolation Panel [NHANES]    Frequency of Communication with Friends and Family: Twice a week    Frequency of Social Gatherings with Friends and Family: Three times a week    Attends Religious Services: 1 to 4 times per year    Active Member of Clubs or Organizations: No    Attends Banker Meetings: Never    Marital Status: Separated  Intimate Partner Violence: Not At Risk (11/03/2022)   Humiliation, Afraid, Rape, and Kick questionnaire    Fear of Current or Ex-Partner: No    Emotionally Abused: No    Physically Abused: No    Sexually Abused: No    FAMILY HISTORY: Family History  Problem Relation Age of Onset   Cancer Sister    Diabetes Mother    Cancer Maternal Grandmother    Cancer  Paternal Grandmother     ALLERGIES:  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  MEDICATIONS:  Current Outpatient Medications  Medication Sig Dispense Refill   apixaban  (ELIQUIS ) 5 MG TABS tablet Take 1 tablet (5 mg total) by mouth 2 (two) times daily. 60 tablet 1   APIXABAN  (ELIQUIS ) VTE STARTER PACK (10MG  AND 5MG ) Take as directed on package: start with two-5mg  tablets twice daily for 7 days. On day 8, switch to one-5mg  tablet twice daily. 74 each 0   calcium -vitamin D (OSCAL WITH D) 500-5 MG-MCG tablet Take 2 tablets by mouth daily. 60 tablet 3   capecitabine  (XELODA ) 150 MG tablet Take 1 tablet (150 mg total) by mouth 2 (two) times daily after a meal. Take 14 days on, 7 days off, repeat every 21 days. Take along with capecitabine  500mg  prescription. 28 tablet 1   capecitabine  (XELODA ) 500 MG tablet Take 3 tablets (1,500 mg total) by mouth 2 (two) times daily after a meal. Take 14 days on, 7 days off, repeat every 21 days. Take along with capecitabine  150mg  prescription. 84 tablet 1   citalopram  (CELEXA ) 10 MG tablet Take 1 tablet (10 mg total) by mouth daily. 30 tablet 3   clindamycin  (CLINDAGEL ) 1 % gel Apply topically 2 (two) times daily. 30 g 4   lidocaine -prilocaine  (EMLA ) cream Apply 1 Application topically as needed. Apply small amount to port and cover with saran wrap 1-2 hours prior to port access 30 g 2   loperamide  (IMODIUM ) 2 MG capsule Take 1 capsule (2 mg total) by mouth See admin instructions. 90 capsule 2   magnesium  chloride (SLOW-MAG) 64 MG TBEC SR tablet Take 1 tablet (64 mg total) by mouth daily. 30 tablet 1   ondansetron  (ZOFRAN ) 8 MG tablet Take 1 tablet (8 mg total) by mouth every 8 (eight) hours as needed for nausea or vomiting. 90 tablet 1   potassium chloride  SA (KLOR-Barry  M) 20 MEQ tablet Take 2 tablets (40 mEq total) by mouth in the morning and take 1 tablet ( ) in the evening. 60 tablet 2   prochlorperazine  (COMPAZINE ) 10 MG tablet Take 1 tablet (10 mg total) by mouth  every 6 (six) hours as needed for nausea or vomiting. 90 tablet 1   senna (SENOKOT) 8.6 MG TABS tablet Take 2 tablets (17.2 mg total) by mouth daily. 100 tablet 0   traZODone  (  DESYREL ) 50 MG tablet Take 1 tablet (50 mg total) by mouth at bedtime as needed for sleep. 30 tablet 3   nicotine  (NICODERM CQ  - DOSED IN MG/24 HR) 7 mg/24hr patch Place 1 patch (7 mg total) onto the skin daily. (Patient not taking: Reported on 09/23/2023) 28 patch 2   oxyCODONE  (ROXICODONE ) 5 MG immediate release tablet Take 1-2 tablets (5-10 mg total) by mouth every 6 (six) hours as needed for severe pain. (Patient not taking: Reported on 09/23/2023) 90 tablet 0   No current facility-administered medications for this visit.   Facility-Administered Medications Ordered in Other Visits  Medication Dose Route Frequency Provider Last Rate Last Admin   0.9 %  sodium chloride  infusion   Intravenous Continuous [name], MD   Stopped at 09/23/23 1238   heparin  lock flush 100 unit/mL  500 Units Intravenous Once [name], MD       sodium chloride  flush (NS) 0.9 % injection 10 mL  10 mL Intracatheter PRN [name], MD   10 mL at 09/23/23 1240     PHYSICAL EXAMINATION: ECOG PERFORMANCE STATUS: 1 - Symptomatic but completely ambulatory Vitals:   09/23/23 0935 09/23/23 0945  BP: (!) 165/106 (!) 151/104  Pulse: 89   Resp: 18   Temp: (!) 96 F (35.6 C)   SpO2: 100%      Filed Weights   09/23/23 0935  Weight: 183 lb 12.8 oz (83.4 kg)       Physical Exam HENT:     Head: Normocephalic and atraumatic.  Eyes:     General: No scleral icterus. Cardiovascular:     Rate and Rhythm: Normal rate and regular rhythm.     Heart sounds: Normal heart sounds.  Pulmonary:     Effort: Pulmonary effort is normal. No respiratory distress.     Breath sounds: No wheezing.     Comments: Decreased breath sound bilaterally.  Abdominal:     General: Bowel sounds are normal. There is no distension.     Palpations: Abdomen is soft.      Comments: Colostomy bag  Genitourinary:    Comments: History of APR, Musculoskeletal:        General: No deformity. Normal range of motion.     Cervical back: Normal range of motion and neck supple.  Skin:    General: Skin is warm and dry.     Findings: No erythema.  Neurological:     Mental Status: He is alert and oriented to person, place, and time. Mental status is at baseline.     Cranial Nerves: No cranial nerve deficit.  Psychiatric:        Mood and Affect: Mood normal.     LABORATORY DATA:  I have reviewed the data as listed    Latest Ref Rng & Units 09/23/2023    8:49 AM 09/11/2023    8:31 AM 08/25/2023   10:32 AM  CBC  WBC 4.0 - 10.5 K/uL 4.3  4.2  5.2   Hemoglobin 13.0 - 17.0 g/dL 09.8  11.9  14.7   Hematocrit 39.0 - 52.0 % 41.1  40.1  37.7   Platelets 150 - 400 K/uL 175  214  214       Latest Ref Rng & Units 09/23/2023    8:49 AM 09/11/2023    8:31 AM 08/25/2023   10:32 AM  CMP  Glucose 70 - 99 mg/dL 829  562  130   BUN 6 - 20 mg/dL 9  10  6   Creatinine 0.61 - 1.24 mg/dL 1.61  0.96  0.45   Sodium 135 - 145 mmol/L 138  140  138   Potassium 3.5 - 5.1 mmol/L 3.9  3.8  3.6   Chloride 98 - 111 mmol/L 107  109  106   CO2 22 - 32 mmol/L 23  23  22    Calcium  8.9 - 10.3 mg/dL 8.8  8.5  8.5   Total Protein 6.5 - 8.1 g/dL 7.1  7.0  6.7   Total Bilirubin 0.0 - 1.2 mg/dL 0.6  0.6  0.5   Alkaline Phos 38 - 126 U/L 75  75  75   AST 15 - 41 U/L 18  18  21    ALT 0 - 44 U/L 16  14  16       RADIOGRAPHIC STUDIES: I have personally reviewed the radiological images as listed and agreed with the findings in the report. CT CHEST ABDOMEN PELVIS W CONTRAST Result Date: 08/14/2023 CLINICAL DATA:  Rectal cancer staging.  * Tracking Code: BO *. EXAM: CT CHEST, ABDOMEN, AND PELVIS WITH CONTRAST TECHNIQUE: Multidetector CT imaging of the chest, abdomen and pelvis was performed following the standard protocol during bolus administration of intravenous contrast. RADIATION DOSE REDUCTION:  This exam was performed according to the departmental dose-optimization program which includes automated exposure control, adjustment of the mA and/or kV according to patient size and/or use of iterative reconstruction technique. CONTRAST:  85mL OMNIPAQUE  IOHEXOL  300 MG/ML  SOLN COMPARISON:  Pelvic MRI 03/30/2023. Abdomen and pelvis CT 03/02/2023 and older. PET-CT scan 12/17/2022. FINDINGS: CT CHEST FINDINGS Cardiovascular: Right upper chest port in place with the tip seen as far as the right atrium. The port is accessed. Heart is nonenlarged. No pericardial effusion. Thoracic aorta has a normal course and caliber with slight atherosclerotic change. Note is made of right lower lobe pulmonary embolism in the anterior lower lobe such as series 2, image 35. Small amount of clot along the lobar vessels as well as seen on coronal image 75. No larger or additional central emboli identified. Mediastinum/Nodes: Slightly patulous thoracic esophagus. Preserved thyroid gland. No discrete abnormal lymph node enlargement present in the axillary regions, hilum or mediastinum. Lungs/Pleura: Centrilobular emphysematous changes identified. No pneumothorax. There is breathing motion. There is some patchy opacity identified along the right lower lobe. Atelectasis versus infiltrates possible. Recommend correlation with a particular symptoms. Follow up as clinically directed. There is a small nodule along the margin of the left interlobar fissure measuring 5 mm. Essentially unchanged from a chest CT of 10/09/2022. This has been stable since at least September 2023 as well. Musculoskeletal: Scattered degenerative changes. CT ABDOMEN PELVIS FINDINGS Hepatobiliary: Fatty liver infiltration. Patent portal vein. Gallbladder is present low-attenuation lesion posteroinferior along the right hepatic lobe previously measuring 13 mm is stable today on series 2, image 67 again likely a benign cystic lesion. Otherwise no clear space-occupying liver  lesion at this time. Pancreas: Moderate atrophy of the pancreas without obvious mass. Spleen: Small spleen. Adrenals/Urinary Tract: Stable slight nonspecific thickening of the adrenal glands. No enhancing renal mass or collecting system dilatation. The ureters have normal course and caliber extending down to the underdistended urinary bladder. There are some tiny low-attenuation foci along the kidneys which are too small to completely characterize but likely benign cystic foci, Bosniak 2 lesions. No specific imaging follow-up of these renal foci Stomach/Bowel: Oral contrast was administered. The stomach is underdistended. Small bowel is nondilated. 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  Proximal this there is moderate colonic stool. The colon is nondilated. Normal retrocecal appendix. In the area of the rectum is a focal heterogeneous mass abutting the posterior aspect of the urinary bladder. In the axial plane previous study measured of the lesion at 5.3 x 4.9 cm. When measuring in the same fashion at a similar level today 5.6 by 4.6 cm, similar. This abuts the adjacent musculature laterally. Cephalocaudal extension in the presacral space approaches 10.3 cm today and going back to the prior when measured in the same fashion 9.8 cm. Also abuts some small bowel loops in the central pelvis posteriorly. Involvement of the seminal vesicles is possible. Vascular/Lymphatic: Diffuse vascular calcifications. Normal caliber aorta and IVC. Circumaortic left renal vein. No new lymph node enlargement identified in the upper abdomen. The right pelvic sidewall lesion previously measuring 2.8 x 3.2 cm, today on series 2, image 112 measures 3.3 x 2.6 cm. Similar. Reproductive: Poor definition of the prostate and seminal vesicles. Other: Stranding of the fat in the pelvis and lower abdomen. No free intra-air. No loculated fluid collections clearly seen. Musculoskeletal: Curvature of the spine. Scattered degenerative changes of  the spine and pelvis. Scattered Schmorl's node changes. IMPRESSION: Heterogeneous central pelvic mass in the area of the rectum overall similar configuration to the previous CT scan. Again the lesion abuts the bladder in the area of the seminal vesicles. Extension up along the presacral space. Surgical changes from diverting colostomy. Essentially stable right pelvic sidewall soft tissue mass or nodal enlargement. No new nodal enlargement elsewhere in the chest, abdomen or pelvis. Fatty liver infiltration. Subtle nodular opacity along the right lung base. Infiltrates possible. Please correlate with symptoms and recommend follow-up. Incidental right lower lobe segmental and lobar pulmonary embolism. Critical Value/emergent results were called by telephone at the time of interpretation on 08/14/2023 at 5:58 pm to provider Dr. Adrian Alba, who verbally acknowledged these results. Electronically Signed   By: Adrianna Horde M.D.   On: 08/14/2023 17:59

## 2023-09-23 NOTE — Assessment & Plan Note (Signed)
 His diastolic BP is chronically elevated.  Discussed about starting BP medication and he declines.  I recommend him to further discuss with PCP

## 2023-09-23 NOTE — Assessment & Plan Note (Signed)
 Chemotherapy plan as listed

## 2023-09-23 NOTE — Assessment & Plan Note (Signed)
 Continue Eliquis 5mg  BID

## 2023-09-23 NOTE — Patient Instructions (Signed)
 [facility] - A DEPT OF [facility]  Discharge Instructions: Thank you for choosing [facility] to provide your oncology and hematology care.  If you have a lab appointment with the Cancer Center, please go directly to the Cancer Center and check in at the registration area.  Wear comfortable clothing and clothing appropriate for easy access to any Portacath or PICC line.   We strive to give you quality time with your provider. You may need to reschedule your appointment if you arrive late (15 or more minutes).  Arriving late affects you and other patients whose appointments are after yours.  Also, if you miss three or more appointments without notifying the office, you may be dismissed from the clinic at the provider's discretion.      For prescription refill requests, have your pharmacy contact our office and allow 72 hours for refills to be completed.    Today you received the following chemotherapy and/or immunotherapy agents panitumumab        To help prevent nausea and vomiting after your treatment, we encourage you to take your nausea medication as directed.  BELOW ARE SYMPTOMS THAT SHOULD BE REPORTED IMMEDIATELY: *FEVER GREATER THAN 100.4 F (38 C) OR HIGHER *CHILLS OR SWEATING *NAUSEA AND VOMITING THAT IS NOT CONTROLLED WITH YOUR NAUSEA MEDICATION *UNUSUAL SHORTNESS OF BREATH *UNUSUAL BRUISING OR BLEEDING *URINARY PROBLEMS (pain or burning when urinating, or frequent urination) *BOWEL PROBLEMS (unusual diarrhea, constipation, pain near the anus) TENDERNESS IN MOUTH AND THROAT WITH OR WITHOUT PRESENCE OF ULCERS (sore throat, sores in mouth, or a toothache) UNUSUAL RASH, SWELLING OR PAIN  UNUSUAL VAGINAL DISCHARGE OR ITCHING   Items with * indicate a potential emergency and should be followed up as soon as possible or go to the Emergency Department if any problems should occur.  Please show the CHEMOTHERAPY ALERT CARD or IMMUNOTHERAPY  ALERT CARD at check-in to the Emergency Department and triage nurse.  Should you have questions after your visit or need to cancel or reschedule your appointment, please contact [facility] - A DEPT OF [facility]  [phone] and follow the prompts.  Office hours are 8:00 a.m. to 4:30 p.m. Monday - Friday. Please note that voicemails left after 4:00 p.m. may not be returned until the following business day.  We are closed weekends and major holidays. You have access to a nurse at all times for urgent questions. Please call the main number to the clinic [phone] and follow the prompts.  For any non-urgent questions, you may also contact your provider using MyChart. We now offer e-Visits for anyone 66 and older to request care online for non-urgent symptoms. For details visit [email or web address].   Also download the MyChart app! Go to the app store, search "MyChart", open the app, select [facility], and log in with your MyChart username and password.

## 2023-09-23 NOTE — Assessment & Plan Note (Addendum)
 locally advanced rectal cancer-Stage IIIB, s/p TNT neoadjuvant protocol, concurrent Xeloda and radiation-Finished 08/27/2021, s/p  APR,rectal adenocarcinoma, ypT3N0. Positive radial margin due to perforation. Biopsy proven recurrent rectal cancer, locally advanced disease, possible colovesical fistula; no distant metastatic disease on PET- per [name] Surgeon Dr. Luciano Cutter, recurrence is not resectable. --> June 2024 S/p concurrent Xeloda 825 mg/m2 twice daily  with radiation --> July 2024 MRI pelvis w wo contrast showed persistent disease. Disease is not resectable per colorectal surgeon --> FOLFIRI Panitumumab --> Dec 2025 partial response.  Labs are reviewed and discussed with patient. Patient adamantly did not want to continue 5-FU pump. He also prefers no weekly 5-FU infusion as alternative.  Shared decision was made to switch to Xeloda with panitumumab. I will omit Irinotecan due to toxicity concerns combining with Xeloda.  Plan Xeloda 850 mg/m2 twice daily on days 1 to 14 of a 21-day cycle plus Panitumumab Q3 weeks. [Panel study regimen]. Rationale and side effects were reviewed with patient and he agrees.  Proceed with treatment.

## 2023-09-24 LAB — CEA: CEA: 1.7 ng/mL (ref 0.0–4.7)

## 2023-09-29 NOTE — Assessment & Plan Note (Deleted)
locally advanced rectal cancer-Stage IIIB, s/p TNT neoadjuvant protocol, concurrent Xeloda and radiation-Finished 08/27/2021, s/p  APR,rectal adenocarcinoma, ypT3N0. Positive radial margin due to perforation. Biopsy proven recurrent rectal cancer, locally advanced disease, possible colovesical fistula; no distant metastatic disease on PET- per [name] Surgeon Dr. Luciano Cutter, recurrence is not resectable. --> June 2024 S/p concurrent Xeloda 825 mg/m2 twice daily  with radiation --> July 2024 MRI pelvis w wo contrast showed persistent disease. Disease is not resectable per colorectal surgeon --> FOLFIRI Panitumumab --> Dec 2025 partial response.  Labs are reviewed and discussed with patient. Patient adamantly did not want to continue 5-FU pump. He also prefers no weekly 5-FU infusion as alternative.  Shared decision was made to switch to Xeloda with panitumumab. I will omit Irinotecan due to toxicity concerns combining with Xeloda.  Plan Xeloda 850 mg/m2 twice daily on days 1 to 14 of a 21-day cycle plus Panitumumab Q3 weeks. [Panel study regimen]. Rationale and side effects were reviewed with patient and he agrees.  Proceed with treatment.

## 2023-09-30 ENCOUNTER — Inpatient Hospital Stay: Payer: Medicare Other

## 2023-09-30 ENCOUNTER — Inpatient Hospital Stay: Payer: Medicare Other | Admitting: Oncology

## 2023-09-30 DIAGNOSIS — C20 Malignant neoplasm of rectum: Secondary | ICD-10-CM

## 2023-10-01 ENCOUNTER — Other Ambulatory Visit: Payer: Self-pay

## 2023-10-01 DIAGNOSIS — C20 Malignant neoplasm of rectum: Secondary | ICD-10-CM

## 2023-10-02 ENCOUNTER — Ambulatory Visit: Payer: Medicare Other | Admitting: Oncology

## 2023-10-02 ENCOUNTER — Inpatient Hospital Stay: Payer: Medicare Other

## 2023-10-02 ENCOUNTER — Encounter: Payer: Self-pay | Admitting: Oncology

## 2023-10-02 ENCOUNTER — Ambulatory Visit: Payer: Medicare Other

## 2023-10-02 ENCOUNTER — Other Ambulatory Visit: Payer: Medicare Other

## 2023-10-02 ENCOUNTER — Other Ambulatory Visit: Payer: Self-pay

## 2023-10-02 ENCOUNTER — Inpatient Hospital Stay (HOSPITAL_BASED_OUTPATIENT_CLINIC_OR_DEPARTMENT_OTHER): Payer: Medicare Other | Admitting: Oncology

## 2023-10-02 VITALS — BP 139/96 | HR 99 | Temp 96.3°F | Resp 18 | Wt 179.8 lb

## 2023-10-02 DIAGNOSIS — Z7901 Long term (current) use of anticoagulants: Secondary | ICD-10-CM | POA: Diagnosis not present

## 2023-10-02 DIAGNOSIS — I2699 Other pulmonary embolism without acute cor pulmonale: Secondary | ICD-10-CM | POA: Diagnosis not present

## 2023-10-02 DIAGNOSIS — C20 Malignant neoplasm of rectum: Secondary | ICD-10-CM

## 2023-10-02 DIAGNOSIS — Z86711 Personal history of pulmonary embolism: Secondary | ICD-10-CM | POA: Diagnosis not present

## 2023-10-02 DIAGNOSIS — Z5112 Encounter for antineoplastic immunotherapy: Secondary | ICD-10-CM | POA: Diagnosis not present

## 2023-10-02 DIAGNOSIS — E876 Hypokalemia: Secondary | ICD-10-CM | POA: Diagnosis not present

## 2023-10-02 DIAGNOSIS — R300 Dysuria: Secondary | ICD-10-CM | POA: Diagnosis not present

## 2023-10-02 DIAGNOSIS — I10 Essential (primary) hypertension: Secondary | ICD-10-CM | POA: Diagnosis not present

## 2023-10-02 DIAGNOSIS — Z5111 Encounter for antineoplastic chemotherapy: Secondary | ICD-10-CM

## 2023-10-02 DIAGNOSIS — Z79899 Other long term (current) drug therapy: Secondary | ICD-10-CM | POA: Diagnosis not present

## 2023-10-02 DIAGNOSIS — Z87891 Personal history of nicotine dependence: Secondary | ICD-10-CM | POA: Diagnosis not present

## 2023-10-02 DIAGNOSIS — Z933 Colostomy status: Secondary | ICD-10-CM | POA: Diagnosis not present

## 2023-10-02 DIAGNOSIS — Z923 Personal history of irradiation: Secondary | ICD-10-CM | POA: Diagnosis not present

## 2023-10-02 LAB — CMP (CANCER CENTER ONLY)
ALT: 17 U/L (ref 0–44)
AST: 19 U/L (ref 15–41)
Albumin: 3.9 g/dL (ref 3.5–5.0)
Alkaline Phosphatase: 76 U/L (ref 38–126)
Anion gap: 8 (ref 5–15)
BUN: 9 mg/dL (ref 6–20)
CO2: 27 mmol/L (ref 22–32)
Calcium: 8.9 mg/dL (ref 8.9–10.3)
Chloride: 101 mmol/L (ref 98–111)
Creatinine: 0.88 mg/dL (ref 0.61–1.24)
GFR, Estimated: >60 mL/min (ref >60)
Glucose, Bld: 136 mg/dL — ABNORMAL HIGH (ref 70–99)
Potassium: 3 mmol/L — ABNORMAL LOW (ref 3.5–5.1)
Sodium: 136 mmol/L (ref 135–145)
Total Bilirubin: 0.8 mg/dL (ref 0.0–1.2)
Total Protein: 7.4 g/dL (ref 6.5–8.1)

## 2023-10-02 LAB — CBC WITH DIFFERENTIAL (CANCER CENTER ONLY)
Abs Immature Granulocytes: 0.02 10*3/uL (ref 0.00–0.07)
Basophils Absolute: 0 10*3/uL (ref 0.0–0.1)
Basophils Relative: 0 %
Eosinophils Absolute: 0.1 10*3/uL (ref 0.0–0.5)
Eosinophils Relative: 2 %
HCT: 43.4 % (ref 39.0–52.0)
Hemoglobin: 15.4 g/dL (ref 13.0–17.0)
Immature Granulocytes: 1 %
Lymphocytes Relative: 23 %
Lymphs Abs: 1 10*3/uL (ref 0.7–4.0)
MCH: 32.7 pg (ref 26.0–34.0)
MCHC: 35.5 g/dL (ref 30.0–36.0)
MCV: 92.1 fL (ref 80.0–100.0)
Monocytes Absolute: 0.8 10*3/uL (ref 0.1–1.0)
Monocytes Relative: 19 %
Neutro Abs: 2.4 10*3/uL (ref 1.7–7.7)
Neutrophils Relative %: 55 %
Platelet Count: 180 10*3/uL (ref 150–400)
RBC: 4.71 MIL/uL (ref 4.22–5.81)
RDW: 13.9 % (ref 11.5–15.5)
Smear Review: NORMAL
WBC Count: 4.3 10*3/uL (ref 4.0–10.5)
nRBC: 0 % (ref 0.0–0.2)

## 2023-10-02 MED ORDER — POTASSIUM CHLORIDE CRYS ER 20 MEQ PO TBCR
20.0000 meq | EXTENDED_RELEASE_TABLET | ORAL | 2 refills | Status: DC
  Filled 2023-10-02: qty 60, 20d supply, fill #0

## 2023-10-02 MED ORDER — RIVAROXABAN 20 MG PO TABS
20.0000 mg | ORAL_TABLET | Freq: Every day | ORAL | 4 refills | Status: DC
  Filled 2023-10-02: qty 30, 30d supply, fill #0

## 2023-10-02 MED ORDER — HEPARIN SOD (PORK) LOCK FLUSH 100 UNIT/ML IV SOLN
500.0000 [IU] | Freq: Once | INTRAVENOUS | Status: AC
  Administered 2023-10-02: 500 [IU] via INTRAVENOUS
  Filled 2023-10-02: qty 5

## 2023-10-02 NOTE — Assessment & Plan Note (Signed)
Recommend patient to continue slow Mag 1 tab daily.

## 2023-10-02 NOTE — Assessment & Plan Note (Addendum)
locally advanced rectal cancer-Stage IIIB, s/p TNT neoadjuvant protocol, concurrent Xeloda and radiation-Finished 08/27/2021, s/p  APR,rectal adenocarcinoma, ypT3N0. Positive radial margin due to perforation. Biopsy proven recurrent rectal cancer, locally advanced disease, possible colovesical fistula; no distant metastatic disease on PET- per [name] Surgeon Dr. Luciano Cutter, recurrence is not resectable. --> June 2024 S/p concurrent Xeloda 825 mg/m2 twice daily  with radiation --> July 2024 MRI pelvis w wo contrast showed persistent disease. Disease is not resectable per colorectal surgeon --> FOLFIRI Panitumumab --> Dec 2025 partial response.  Labs are reviewed and discussed with patient. Patient adamantly did not want to continue 5-FU pump. He also prefers no weekly 5-FU infusion as alternative.  Shared decision was made to switch to Xeloda with panitumumab. I will omit Irinotecan due to toxicity concerns combining with Xeloda.  Recommend Xeloda 850 mg/m2 twice daily on days 1 to 14 of a 21-day cycle plus Panitumumab Q3 weeks. [Panel study regimen].  He tolerates regimen well. Continue and finish first cycle of xeloda

## 2023-10-02 NOTE — Progress Notes (Signed)
Hematology/Oncology Progress note [phone] [phone]      CHIEF COMPLAINTS/REASON FOR VISIT:  Follow up for recurrent rectal cancer treatment  ASSESSMENT & PLAN:   Cancer Staging  Rectal cancer [facility]) Staging form: Colon and Rectum, AJCC 8th Edition - Clinical stage from 02/08/2021: Stage IIIB (cT4a, cN1, cM0) - Signed by Barry Patience, MD on 03/06/2021   Rectal cancer [facility]) locally advanced rectal cancer-Stage IIIB, s/p TNT neoadjuvant protocol, concurrent Xeloda and radiation-Finished 08/27/2021, s/p  APR,rectal adenocarcinoma, ypT3N0. Positive radial margin due to perforation. Biopsy proven recurrent rectal cancer, locally advanced disease, possible colovesical fistula; no distant metastatic disease on PET- per [name] Surgeon Dr. Luciano Cutter, recurrence is not resectable. --> June 2024 S/p concurrent Xeloda 825 mg/m2 twice daily  with radiation --> July 2024 MRI pelvis w wo contrast showed persistent disease. Disease is not resectable per colorectal surgeon --> FOLFIRI Panitumumab --> Dec 2025 partial response.  Labs are reviewed and discussed with patient. Patient adamantly did not want to continue 5-FU pump. He also prefers no weekly 5-FU infusion as alternative.  Shared decision was made to switch to Xeloda with panitumumab. I will omit Irinotecan due to toxicity concerns combining with Xeloda.  Recommend Xeloda 850 mg/m2 twice daily on days 1 to 14 of a 21-day cycle plus Panitumumab Q3 weeks. [Panel study regimen].  He tolerates regimen well. Continue and finish first cycle of xeloda  Acute pulmonary embolism (HCC) He can not afford copay for Eliquis  Refer to Child psychotherapist.  Switch to Xarelto 20mg  daily. 1 month supply free coupon provided to patient.   Encounter for antineoplastic chemotherapy Chemotherapy plan as listed  Hypokalemia K is decreased. He declined IV KCL.  Recommend potassium to in AM and in PM.   Hypomagnesemia Recommend patient to  continue slow Mag 1 tab daily.       Orders Placed This Encounter  Procedures   Magnesium    Standing Status:   Future    Expected Date:   11/04/2023    Expiration Date:   11/03/2024   CEA    Standing Status:   Future    Expected Date:   11/04/2023    Expiration Date:   11/03/2024   CBC with Differential (Cancer Center Only)    Standing Status:   Future    Expected Date:   11/04/2023    Expiration Date:   11/03/2024   CMP (Cancer Center only)    Standing Status:   Future    Expected Date:   11/04/2023    Expiration Date:   11/03/2024   Magnesium    Standing Status:   Future    Expected Date:   11/04/2023    Expiration Date:   11/03/2024   Magnesium    Standing Status:   Future    Expected Date:   11/25/2023    Expiration Date:   11/24/2024   CEA    Standing Status:   Future    Expected Date:   11/25/2023    Expiration Date:   11/24/2024   CBC with Differential (Cancer Center Only)    Standing Status:   Future    Expected Date:   11/25/2023    Expiration Date:   11/24/2024   CMP (Cancer Center only)    Standing Status:   Future    Expected Date:   11/25/2023    Expiration Date:   11/24/2024   Magnesium    Standing Status:   Future    Expected  Date:   11/25/2023    Expiration Date:   11/24/2024  Follow-up  2 weeks All questions were answered. The patient knows to call the clinic with any problems, questions or concerns.  Barry Patience, MD, PhD [facility] Hematology Oncology 10/02/2023        HISTORY OF PRESENTING ILLNESS:   Barry [name]. is a  58 y.o.  male presents for rectal cancer Oncology History  Rectal cancer (HCC)  02/08/2021 Initial Diagnosis   Rectal cancer   02/05/2021-02/06/2021 patient was hospitalized due to generalized weakness, intermittent lightheadedness, weight loss and worsening constipation.  02/05/2021 CT abdomen showed concerning of severe rectal wall thickening and right internal iliac lymph node concerning for metastatic disease.  Patient was seen by  gastroenterology and had colonoscopy which showed a circumferential fungating mass in the rectum.  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   02/14/2021-02/16/2021 hospitalized due to rectal bleeding and pain.   02/14/2021 CT showed perirectal fluid collection/gas concerning for infection with significant leukocytosis, anemia with hemoglobin of 6.8.  Patient received PRBC transfusion, IV antibiotics.  He underwent an IR guided placement of JP drain into the rectal abscess.  Discharged home with oral Augmentin. 02/20/2021 presented to ER with new skin opening and draining from left gluteus.  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  JP drain has been removed.  Patient was continued on Augmentin.   02/13/2021, PET scan showed locally advanced rectal cancer with billowing of the mesorectum now with low attenuation material that was not present on previous examination with extensive stranding and inflammation.   Bulky RIGHT pelvic sidewall/hypogastric lymph node outside of the mesorectum with stippled calcification measuring 19 mm-no increased metabolic activity. Small LEFT hypogastric lymph node just peripheral to the internal external bifurcation-SUV 3.2 High RIGHT internal just below or at the internal/common iliac transition, lymph node -SUV 4.8 LEFT high hypogastric lymph node  8 mm-SUV 3. Scattered lymph nodes throughout the retroperitoneum with low FDG uptake Bilateral inguinal lymph nodes largest on the RIGHT (image 248/3) 11 mm with a maximum SUV of 2.8 spiculated nodule in the LEFT upper lobe- 9 x 8 mm       02/14/2021, CT abdomen pelvis without contrast Showed perforated rectal mass with contained perforation with fluid and gas extending above and below the pelvic floor,potentially involving the sphincter complex and extending into LEFT ischial rectal fossa   02/20/2021, CT pelvis with contrast showed Large perirectal/perianal abscess has markedly decreased in size since  placement of the percutaneous drain. There are residual gas-filled collections in the soft tissues and suspect there is a fistula or sinus tract between the rectal mass and the subcutaneous tissues. Soft tissue gas along the medial left buttock and concern for a cutaneous ulceration in this area. Large rectal mass with evidence for a large necrotic right pelvic lymph node.   His case is complicated with a perirectal abscess. Prior to onset of perirectal abscess, on his 02/05/2021 scan, he was noted to have right internal iliac lymph node 3 x 1 x 2.3 cm which is concerning for nodal disease.On Subsequent images it was difficult to distinguish whether lymphadenopathy was due to nodal disease versus acute inflammation. 03/07/2021 MRI pelvis cT3 N2. Due to the possible contained perforation on previous CT, possible cT4 disease.    02/27/2021 medi port placed by Dr.Dew 03/13/2021 reports right butt cheek and also left perianal area fullness.he was seen by Dr.Pabon urgently  and had right buttock abscess drained. Left perianal fullness  was felt to be due to cancer.    02/08/2021 Cancer Staging   Staging form: Colon and Rectum, AJCC 8th Edition - Clinical stage from 02/08/2021: Stage IIIB (cT4a, cN1, cM0) - Signed by Barry Patience, MD on 03/06/2021 Stage prefix: Initial diagnosis   02/27/2021 Procedure   medi port placed by Dr.Dew   03/18/2021 - 07/05/2021 Chemotherapy    FOLFOX q14d x 4 months      07/17/2021 - 08/27/2021 Chemotherapy   Xeloda concurrent with Radiation   11/18/2021 Surgery   patient is status post open APR with flap for rectal cancer. Pathology rectal adenomacarcinoma ypT3N0.  Positive margin: Radial (circumferential) or mesenteric: adjacent to perforation      01/27/2022 - 02/02/2022 Hospital Admission   Hospitalized at [facility] due to recurrent abscess.  US guided drain into the pelvic abscess with return of 60 ml of purulent fluid. Cultures positive for staph aureus and strep agalactiae group b,  fungal cultures negative.    02/25/2022 Imaging   CT chest angiogram with and without contrast, CT abdomen pelvis with and without contrast 1. Negative for acute pulmonary embolus.2. Emphysema. Further decrease in size of the previously noted irregular nodule in the left upper lobe which is now barely measurable today. No new suspicious lung nodules 3. Status post left lower quadrant colostomy. Further decrease in size since comparison exam from May of the rim enhancing gas and fluid collection at the pelvic surgical bed extending from the perineum superiorly into the pelvis 4. Slightly thickened appearance of terminal ileal small bowel loops in the pelvis with mild stranding suggesting small bowel inflammatory process. 5. Stable enlarged right pelvic sidewall lymph node   06/02/2022 Imaging   CT chest abdomen pelvis w contrast 1. Status post abdominal perineal resection with descending colostomy. 2. At the site of pelvic fluid and gas collection on 02/25/2022, there is residual, decreased presacral soft tissue fullness, without drainable collection. 3. Similar right obturator nodal metastasis.4.  No acute process or evidence of metastatic disease in the chest. 5. Age advanced coronary artery atherosclerosis. Recommend assessment of coronary risk factors. 6. Aortic atherosclerosis and emphysema     06/26/2022 Imaging   CT abdomen pelvis with contrast showed 1. Interval increase in size of a lobulated partially imaged at least 7.8 x 3.8 x 12 cm abscess in the perianal region in a patient status post abdominal perineal resection and left lower lobe end colostomy formation. Finding extends to involve the pre sacral region up to the urinary dome level. Associated posterior urinary bladder wall thickening with lack of intraperitoneal fat plane between the presacral soft tissue thickening/abscess formation suggestive of possible fistulization and invasion of the posterior bladder wall. Underlying  recurrent malignancy is not excluded. 2. Stable 2.7 cm right pelvic sidewall lymph node. 3.  Aortic Atherosclerosis   06/26/2022 - 06/27/2022 Hospital Admission   He went to [facility] ER and CT showed recurrent abscess. He was transferred to [facility] due to recurrent abscess, treated with IV vanc/zosyn, IR was consulted and drainage tube was placed. Wound grew up Strep viridans. Patient was seen by ID at [facility] [place] discharged with Augmentin for 2 weeks.   He was seen by wound care Dr> Kandace Blitz on 07/09/2022, JP drain was removed.    09/29/2022 Imaging   MRI pelvis without contrast showed  Interval abdominoperitoneal resection since prior MRI. Decreased small fluid collection in the surgical bed compared with more recent CT, consistent with resolving postoperative fluid collection or abscess.   Bulky rounded presacral mass,  which shows diffuse restricted diffusion, without significant change in size since most recent CT of 06/26/2022. This raises suspicion for recurrent carcinoma over post treatment changes. Recommend correlation with CEA level, and consider tissue sampling or PET-CT.   Stable enlarged right pelvic sidewall lymph node. No new or increased adenopathy identified.   10/09/2022 Imaging   CT chest with contrast showed 1. Unchanged 0.4 cm fissural nodule of the anterior left lower lobe. This is almost certainly a benign fissural lymph node. No new or suspicious pulmonary nodules. 2. Moderate emphysema and diffuse bilateral bronchial wall thickening. 3. Coronary artery disease.   10/13/2022 Imaging   CT abdomen pelvis with contrast showed 1. Complex collection persists in the lower pelvis, extending from the anal verge upwards into the presacral space and anteriorly from the presacral space to the bladder dome, not significantly changed in size or extent compared to the earlier CT of 06/26/2022. This is most likely a combination of a postoperative seroma and/or chronic phlegmon/abscess versus recurrent  abscess. 2. Bladder walls are thick walled/edematous. Given the contiguity of the presacral collection and the bladder dome, this is highly suspicious for a related bladder wall infection and possibly secondary to colovesical fistula. Recommend correlation with urinalysis. 3. No evidence of bowel obstruction. LEFT lower abdominal wall colostomy, without obstruction or inflammatory change. 4. No free intraperitoneal air   10/13/2022 [facility] Admission   Hospitalized due to fever and rectal pain. Urine culture showed mixed urogenital flora. IR CT guided Aspiration of abscess showed Streptococcal Viridans. He was placed back on Augmentin  10/14/2023 CT read by [facility] radiology Postsurgical changes of abdominoperineal resection. Interval enlargement of  fluid collection in the surgical bed. Multiple foci of enhancing tissue at  the margins of this fluid collection, increased from prior study, suspicious for local recurrence.   Prominent retroperitoneal lymph nodes, some which are increased in size from prior study. These are indeterminate and may be either reactive or represent metastatic disease. Recommend continued attention on follow-up.   Centrally hypoattenuating right obturator lymph node, unchanged in size, consistent with treated disease.   Circumferential bladder wall thickening and irregularity, most pronounced on the left side. This may be reactive in the setting of adjacent pelvic inflammatory changes. Correlate with urinalysis if there is clinical  concern for urinary tract infection.    11/14/2022 Imaging   MRI pelvis w wo contrast  1. Status post abdominoperineal resection with left lower quadrant end colostomy. 2. Compared to prior CT and MR, no significant change in appearance of heterogeneous, rim enhancing presacral and low pelvic soft tissue and fluid. Largest heterogeneously enhancing conglomerate of fluid and soft tissue appears to closely involve the anteriorly abutting the seminal  vesicles and measures 3.0 x 2.9 cm in largest axial dimension. This extends to superiorly contact the posterior bladder dome and inferiorly towards the gluteal cleft. 3. Discrete fluid component at the most inferior extent measuring 2.4 x 1.3 cm. This is markedly diminished in volume compared to more remote previous examinations, for example 06/26/2022. 4. Constellation of findings is highly concerning for locally recurrent rectal malignancy with or without superimposed infection. Presence or absence of infection at this time is not established by MR. 5. Additional unchanged hemorrhagic or proteinaceous fluid collection in the right hemipelvis measuring 3.2 x 2.6 cm most consistent with postoperative hematoma or seroma. 6. Unchanged thickening of the urinary bladder wall. As previously reported, fistula or involvement of bladder wall by malignancy not excluded.   12/09/2022 Procedure  CT guided biopsy of the pelvic mass showed Moderately differentiated adenocarcinoma with dirty necrosis, morphologically identical to patient's prior rectal adenocarcinoma.   [facility] NGS xT 648 panel showed MLH3 start loss,  BCORL1 frame shift, TP53 splice region varian, APC stop gain,  TMB 7.9, MS stable, KRAS/BRAF/NRAS negative.   [facility] NGS xR showed no gene arrangement or reportable altered splicing events in RNA sequencing      01/07/2023 -  Chemotherapy   Xeloda 825 mg/m2 twice daily  with radiation   02/11/2023 Imaging   CT abdomen pelvis w contrast  1. Examination is positive for small bowel obstruction. Transition point is identified within the left iliac fossa. Distal to the transition point the small bowel loops exhibit mucosal enhancement and wall thickening concerning for enteritis. 2. There is new right-sided hydronephrosis and hydroureter up to the level of the bifurcation of right common iliac artery. The distal right ureter appears closely associated with the previously characterized tracer avid  presacral soft tissue mass. Cannot exclude obstructive uropathy secondary to tumor involvement. 3. Similar appearance of presacral soft tissue mass. This was tracer avid on the recent PET-CT from 12/17/2022, and concerning for locally recurrent tumor. 4. Unchanged diffuse circumferential wall thickening involving the urinary bladder. 5. Aortic Atherosclerosis    04/17/2023 - 07/29/2023 Chemotherapy   Patient is on Treatment Plan : COLORECTAL FOLFIRI + Panitumumab q14d     08/14/2023 Imaging   CT chest abdomen pelvis w contrast showed  Heterogeneous central pelvic mass in the area of the rectum overall similar configuration to the previous CT scan. Again the lesion abuts the bladder in the area of the seminal vesicles. Extension up along the presacral space. Surgical changes from diverting colostomy.   Essentially stable right pelvic sidewall soft tissue mass or nodal enlargement. No new nodal enlargement elsewhere in the chest, abdomen or pelvis.   Fatty liver infiltration.   Subtle nodular opacity along the right lung base. Infiltrates possible. Please correlate with symptoms and recommend follow-up.   Incidental right lower lobe segmental and lobar pulmonary embolism.     09/23/2023 -  Chemotherapy   Patient is on Treatment Plan : COLORECTAL Xeloda + Panitumumab q21d       Patient presented to emergency room on 12/08/21, rectal pain.  CT scan showed 18.3 cm fluid and gas collection in the pelvic surgical bed compatible with infected collection/abscess.  Patient was sent to [facility] and admitted for.  Patient has CT-guided drain placement on 12/09/2021.  Blood culture positive for Staph epidermidis which was felt to be contaminant.  Fluid culture grew pansensitive Staph aureus, strep pyogenes and candida albicans.  Patient was treated with IV antibiotics and transition to p.o. Augmentin and fluconazole on 12/11/2021.  #Perirectal abscess status post drainage catheter , patient was recently seen  by [facility] oncology surgeon on 12/24/2021 and was recommended to keep drainage catheter and continue antibiotics. Patient had a repeat CT scan done which showed a smaller but persistent abscess.  He will return to [facility] surgery for follow-up of drain removal. His case was discussed on [name] tumor board for the positive mesenteric margin which was felt to be secondary to previous perforation.  Recommend surveillance.  02/11/23 patient was admitted due to nausea, CT showed small bowel obstruction, severe constipation, NG tube was placed. His symptoms improved and has ostomy output, NG was removed and he tolerated PO.   During the interval ER visit due to abdominal pain.  03/02/23 CT abdomen pelvis w contrast showed . Findings  are again suggestive of probable partial small bowel obstruction which appears likely related to enteritis. Specifically, there are multiple areas of mural thickening throughout the distal small bowel with increased mucosal and mural enhancement, with some very mild proximal small bowel dilatation. 2. Status post APR with left lower quadrant colostomy and large soft tissue mass in the low anatomic pelvis which is slightly more prominent than the recent prior study, and was previously hypermetabolic on prior PET-CT 12/17/2022, suggesting residual disease. 3. Probable nodal mass along the right pelvic sidewall in the right internal iliac nodal distribution, similar to the prior study, previously not hypermetabolic on prior PET-CT. No other lymphadenopathy or definitive signs of metastatic disease noted elsewhere on today's examination.  4. Aortic atherosclerosis, as well as calcified atherosclerotic plaque in the right coronary artery. Please note that although the presence of coronary artery calcium documents the presence of coronary artery disease, the severity of this disease and any potential stenosis cannot be assessed on this non-gated CT examination. Assessment for potential risk factor  modification, dietary therapy or pharmacologic therapy may be warranted, if clinically indicated. 5. Additional incidental findings, as above. Patient has radiation induced enteritis. His symptoms were mild he was discharged home.   INTERVAL HISTORY Asberry Lascola. is a 58 y.o. male who has above history reviewed by me today presents for follow up visit for management of recurrent locally advanced rectal cancer. Denies fever chills, rectal pain. Denies rectal discharge.  Denies nausea vomiting or abdominal pain.   He takes Eliquis, no bleedings events. No SOB. He ran out of Eliquis due to not able to afford copay - 150$      Review of Systems  Constitutional:  Positive for fatigue. Negative for appetite change, chills, fever and unexpected weight change.  HENT:   Negative for hearing loss and voice change.   Eyes:  Negative for eye problems and icterus.  Respiratory:  Negative for chest tightness, cough and shortness of breath.   Cardiovascular:  Negative for chest pain and leg swelling.  Gastrointestinal:  Negative for abdominal distention, abdominal pain, blood in stool, constipation, diarrhea and rectal pain.  Endocrine: Negative for hot flashes.  Genitourinary:  Negative for difficulty urinating, dysuria and frequency.   Musculoskeletal:  Negative for arthralgias.  Skin:  Negative for itching and rash.  Neurological:  Negative for light-headedness and numbness.  Hematological:  Negative for adenopathy. Does not bruise/bleed easily.  Psychiatric/Behavioral:  Negative for confusion.     MEDICAL HISTORY:  Past Medical History:  Diagnosis Date   Headache    Left shoulder pain    Rectal cancer (HCC)     SURGICAL HISTORY: Past Surgical History:  Procedure Laterality Date   COLON SURGERY     COLONOSCOPY WITH PROPOFOL N/A 02/06/2021   Procedure: COLONOSCOPY WITH PROPOFOL;  Surgeon: Toney Reil, MD;  Location: [facility] ENDOSCOPY;  Service: Gastroenterology;  Laterality:  N/A;   PORTA CATH INSERTION N/A 02/27/2021   Procedure: PORTA CATH INSERTION;  Surgeon: Annice Needy, MD;  Location: [facility] INVASIVE CV LAB;  Service: Cardiovascular;  Laterality: N/A;   ROTATOR CUFF REPAIR Left     SOCIAL HISTORY: Social History   Socioeconomic History   Marital status: Married    Spouse name: Not on file   Number of children: Not on file   Years of education: Not on file   Highest education level: Not on file  Occupational History   Not on file  Tobacco Use   Smoking status:  Former    Current packs/day: 0.00    Average packs/day: 1 pack/day for 10.0 years (10.0 ttl pk-yrs)    Types: Cigars, Cigarettes    Start date: 02/05/2011    Quit date: 02/04/2021    Years since quitting: 2.6   Smokeless tobacco: Never  Substance and Sexual Activity   Alcohol use: Not Currently   Drug use: No   Sexual activity: Not on file  Other Topics Concern   Not on file  Social History Narrative   Not on file   Social Drivers of Health   Financial Resource Strain: High Risk (02/13/2023)   Received from [facility], [facility]   Overall Financial Resource Strain (CARDIA)    Difficulty of Paying Living Expenses: Hard  Food Insecurity: No Food Insecurity (02/13/2023)   Received from [facility], [facility]   Hunger Vital Sign    Worried About Running Out of Food in the Last Year: Never true    Ran Out of Food in the Last Year: Never true  Transportation Needs: No Transportation Needs (02/13/2023)   Received from [facility], [facility]   [facility] - Transportation    In the past 12 months, has lack of transportation kept you from medical appointments or from getting medications?: No    Lack of Transportation (Non-Medical): No  Physical Activity: Inactive (11/03/2022)   Exercise Vital Sign    Days of Exercise per Week: 0 days    Minutes of Exercise per Session: 0 min  Stress:  Stress Concern Present (11/03/2022)   [employer] of Occupational Health - Occupational Stress Questionnaire    Feeling of Stress : Rather much  Social Connections: Moderately Isolated (11/03/2022)   Social Connection and Isolation Panel [NHANES]    Frequency of Communication with Friends and Family: Twice a week    Frequency of Social Gatherings with Friends and Family: Three times a week    Attends Religious Services: 1 to 4 times per year    Active Member of Clubs or Organizations: No    Attends Banker Meetings: Never    Marital Status: Separated  Intimate Partner Violence: Not At Risk (11/03/2022)   Humiliation, Afraid, Rape, and Kick questionnaire    Fear of Current or Ex-Partner: No    Emotionally Abused: No    Physically Abused: No    Sexually Abused: No    FAMILY HISTORY: Family History  Problem Relation Age of Onset   Cancer Sister    Diabetes Mother    Cancer Maternal Grandmother    Cancer Paternal Grandmother     ALLERGIES:  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  MEDICATIONS:  Current Outpatient Medications  Medication Sig Dispense Refill   calcium-vitamin D (OSCAL WITH D) 500-5 MG-MCG tablet Take 2 tablets by mouth daily. 60 tablet 3   capecitabine (XELODA) 150 MG tablet Take 1 tablet (150 mg total) by mouth 2 (two) times daily after a meal. Take 14 days on, 7 days off, repeat every 21 days. Take along with capecitabine 500mg  prescription. 28 tablet 1   capecitabine (XELODA) 500 MG tablet Take 3 tablets (1,500 mg total) by mouth 2 (two) times daily after a meal. Take 14 days on, 7 days off, repeat every 21 days. Take along with capecitabine 150mg  prescription. 84 tablet 1   citalopram (CELEXA) 10 MG tablet Take 1 tablet (10 mg total) by mouth daily. 30 tablet 3   clindamycin (CLINDAGEL)  1 % gel Apply topically 2 (two) times daily. 30 g 4   lidocaine-prilocaine (EMLA) cream Apply 1 Application topically as needed. Apply small amount to port and cover with  saran wrap 1-2 hours prior to port access 30 g 2   loperamide (IMODIUM) 2 MG capsule Take 1 capsule (2 mg total) by mouth See admin instructions. 90 capsule 2   magnesium chloride (SLOW-MAG) 64 MG TBEC SR tablet Take 1 tablet (64 mg total) by mouth daily. 30 tablet 1   ondansetron (ZOFRAN) 8 MG tablet Take 1 tablet (8 mg total) by mouth every 8 (eight) hours as needed for nausea or vomiting. 90 tablet 1   prochlorperazine (COMPAZINE) 10 MG tablet Take 1 tablet (10 mg total) by mouth every 6 (six) hours as needed for nausea or vomiting. 90 tablet 1   rivaroxaban (XARELTO) 20 MG TABS tablet Take 1 tablet (20 mg total) by mouth daily with supper. 30 tablet 4   senna (SENOKOT) 8.6 MG TABS tablet Take 2 tablets (17.2 mg total) by mouth daily. 100 tablet 0   traZODone (DESYREL) 50 MG tablet Take 1 tablet (50 mg total) by mouth at bedtime as needed for sleep. 30 tablet 3   nicotine (NICODERM CQ - DOSED IN MG/24 HR) 7 mg/24hr patch Place 1 patch (7 mg total) onto the skin daily. (Patient not taking: Reported on 06/01/2023) 28 patch 2   oxyCODONE (ROXICODONE) 5 MG immediate release tablet Take 1-2 tablets (5-10 mg total) by mouth every 6 (six) hours as needed for severe pain. (Patient not taking: Reported on 07/13/2023) 90 tablet 0   potassium chloride SA (KLOR-CON M) 20 MEQ tablet Take 2 tablets (40 mEq total) by mouth in the morning and take 1 tablet ( ) in the evening. 60 tablet 2   No current facility-administered medications for this visit.   Facility-Administered Medications Ordered in Other Visits  Medication Dose Route Frequency Provider Last Rate Last Admin   heparin lock flush 100 unit/mL  500 Units Intravenous Once Barry Patience, MD         PHYSICAL EXAMINATION: ECOG PERFORMANCE STATUS: 1 - Symptomatic but completely ambulatory Vitals:   10/02/23 1001  BP: (!) 139/96  Pulse: 99  Resp: 18  Temp: (!) 96.3 F (35.7 C)  SpO2: 99%     Filed Weights   10/02/23 1001  Weight: 179 lb 12.8 oz  (81.6 kg)       Physical Exam HENT:     Head: Normocephalic and atraumatic.  Eyes:     General: No scleral icterus. Cardiovascular:     Rate and Rhythm: Normal rate and regular rhythm.     Heart sounds: Normal heart sounds.  Pulmonary:     Effort: Pulmonary effort is normal. No respiratory distress.     Breath sounds: No wheezing.     Comments: Decreased breath sound bilaterally.  Abdominal:     General: Bowel sounds are normal. There is no distension.     Palpations: Abdomen is soft.     Comments: Colostomy bag  Genitourinary:    Comments: History of APR, Musculoskeletal:        General: No deformity. Normal range of motion.     Cervical back: Normal range of motion and neck supple.  Skin:    General: Skin is warm and dry.     Findings: No erythema.  Neurological:     Mental Status: He is alert and oriented to person, place, and time. Mental status is at baseline.  Cranial Nerves: No cranial nerve deficit.  Psychiatric:        Mood and Affect: Mood normal.     LABORATORY DATA:  I have reviewed the data as listed    Latest Ref Rng & Units 10/02/2023    9:36 AM 09/23/2023    8:49 AM 09/11/2023    8:31 AM  CBC  WBC 4.0 - 10.5 K/uL 4.3  4.3  4.2   Hemoglobin 13.0 - 17.0 g/dL 16.1  09.6  04.5   Hematocrit 39.0 - 52.0 % 43.4  41.1  40.1   Platelets 150 - 400 K/uL 180  175  214       Latest Ref Rng & Units 10/02/2023    9:36 AM 09/23/2023    8:49 AM 09/11/2023    8:31 AM  CMP  Glucose 70 - 99 mg/dL 409  811  914   BUN 6 - 20 mg/dL 9  9  10    Creatinine 0.61 - 1.24 mg/dL 7.82  9.56  2.13   Sodium 135 - 145 mmol/L 136  138  140   Potassium 3.5 - 5.1 mmol/L 3.0  3.9  3.8   Chloride 98 - 111 mmol/L 101  107  109   CO2 22 - 32 mmol/L 27  23  23    Calcium 8.9 - 10.3 mg/dL 8.9  8.8  8.5   Total Protein 6.5 - 8.1 g/dL 7.4  7.1  7.0   Total Bilirubin 0.0 - 1.2 mg/dL 0.8  0.6  0.6   Alkaline Phos 38 - 126 U/L 76  75  75   AST 15 - 41 U/L 19  18  18    ALT 0 - 44 U/L 17   16  14       RADIOGRAPHIC STUDIES: I have personally reviewed the radiological images as listed and agreed with the findings in the report. CT CHEST ABDOMEN PELVIS W CONTRAST Result Date: 08/14/2023 CLINICAL DATA:  Rectal cancer staging.  * Tracking Code: BO *. EXAM: CT CHEST, ABDOMEN, AND PELVIS WITH CONTRAST TECHNIQUE: Multidetector CT imaging of the chest, abdomen and pelvis was performed following the standard protocol during bolus administration of intravenous contrast. RADIATION DOSE REDUCTION: This exam was performed according to the departmental dose-optimization program which includes automated exposure control, adjustment of the mA and/or kV according to patient size and/or use of iterative reconstruction technique. CONTRAST:  85mL OMNIPAQUE IOHEXOL 300 MG/ML  SOLN COMPARISON:  Pelvic MRI 03/30/2023. Abdomen and pelvis CT 03/02/2023 and older. PET-CT scan 12/17/2022. FINDINGS: CT CHEST FINDINGS Cardiovascular: Right upper chest port in place with the tip seen as far as the right atrium. The port is accessed. Heart is nonenlarged. No pericardial effusion. Thoracic aorta has a normal course and caliber with slight atherosclerotic change. Note is made of right lower lobe pulmonary embolism in the anterior lower lobe such as series 2, image 35. Small amount of clot along the lobar vessels as well as seen on coronal image 75. No larger or additional central emboli identified. Mediastinum/Nodes: Slightly patulous thoracic esophagus. Preserved thyroid gland. No discrete abnormal lymph node enlargement present in the axillary regions, hilum or mediastinum. Lungs/Pleura: Centrilobular emphysematous changes identified. No pneumothorax. There is breathing motion. There is some patchy opacity identified along the right lower lobe. Atelectasis versus infiltrates possible. Recommend correlation with a particular symptoms. Follow up as clinically directed. There is a small nodule along the margin of the left  interlobar fissure measuring 5 mm. Essentially unchanged from a chest CT  of 10/09/2022. This has been stable since at least September 2023 as well. Musculoskeletal: Scattered degenerative changes. CT ABDOMEN PELVIS FINDINGS Hepatobiliary: Fatty liver infiltration. Patent portal vein. Gallbladder is present low-attenuation lesion posteroinferior along the right hepatic lobe previously measuring 13 mm is stable today on series 2, image 67 again likely a benign cystic lesion. Otherwise no clear space-occupying liver lesion at this time. Pancreas: Moderate atrophy of the pancreas without obvious mass. Spleen: Small spleen. Adrenals/Urinary Tract: Stable slight nonspecific thickening of the adrenal glands. No enhancing renal mass or collecting system dilatation. The ureters have normal course and caliber extending down to the underdistended urinary bladder. There are some tiny low-attenuation foci along the kidneys which are too small to completely characterize but likely benign cystic foci, Bosniak 2 lesions. No specific imaging follow-up of these renal foci Stomach/Bowel: Oral contrast was administered. The stomach is underdistended. Small bowel is nondilated. 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 Proximal this there is moderate colonic stool. The colon is nondilated. Normal retrocecal appendix. In the area of the rectum is a focal heterogeneous mass abutting the posterior aspect of the urinary bladder. In the axial plane previous study measured of the lesion at 5.3 x 4.9 cm. When measuring in the same fashion at a similar level today 5.6 by 4.6 cm, similar. This abuts the adjacent musculature laterally. Cephalocaudal extension in the presacral space approaches 10.3 cm today and going back to the prior when measured in the same fashion 9.8 cm. Also abuts some small bowel loops in the central pelvis posteriorly. Involvement of the seminal vesicles is possible. Vascular/Lymphatic: Diffuse vascular calcifications.  Normal caliber aorta and IVC. Circumaortic left renal vein. No new lymph node enlargement identified in the upper abdomen. The right pelvic sidewall lesion previously measuring 2.8 x 3.2 cm, today on series 2, image 112 measures 3.3 x 2.6 cm. Similar. Reproductive: Poor definition of the prostate and seminal vesicles. Other: Stranding of the fat in the pelvis and lower abdomen. No free intra-air. No loculated fluid collections clearly seen. Musculoskeletal: Curvature of the spine. Scattered degenerative changes of the spine and pelvis. Scattered Schmorl's node changes. IMPRESSION: Heterogeneous central pelvic mass in the area of the rectum overall similar configuration to the previous CT scan. Again the lesion abuts the bladder in the area of the seminal vesicles. Extension up along the presacral space. Surgical changes from diverting colostomy. Essentially stable right pelvic sidewall soft tissue mass or nodal enlargement. No new nodal enlargement elsewhere in the chest, abdomen or pelvis. Fatty liver infiltration. Subtle nodular opacity along the right lung base. Infiltrates possible. Please correlate with symptoms and recommend follow-up. Incidental right lower lobe segmental and lobar pulmonary embolism. Critical Value/emergent results were called by telephone at the time of interpretation on 08/14/2023 at 5:58 pm to provider Dr. Orlie Dakin, who verbally acknowledged these results. Electronically Signed   By: Karen Kays M.D.   On: 08/14/2023 17:59

## 2023-10-02 NOTE — Assessment & Plan Note (Signed)
Chemotherapy plan as listed

## 2023-10-02 NOTE — Assessment & Plan Note (Signed)
K is decreased. He declined IV KCL.  Recommend potassium to in AM and in PM.

## 2023-10-02 NOTE — Assessment & Plan Note (Signed)
He can not afford copay for Eliquis  Refer to Child psychotherapist.  Switch to Xarelto 20mg  daily. 1 month supply free coupon provided to patient.

## 2023-10-06 ENCOUNTER — Other Ambulatory Visit: Payer: Self-pay

## 2023-10-08 ENCOUNTER — Encounter: Payer: Self-pay | Admitting: Oncology

## 2023-10-08 ENCOUNTER — Other Ambulatory Visit: Payer: Self-pay

## 2023-10-09 ENCOUNTER — Ambulatory Visit: Payer: Medicare Other

## 2023-10-09 ENCOUNTER — Other Ambulatory Visit: Payer: Medicare Other

## 2023-10-09 ENCOUNTER — Ambulatory Visit: Payer: Medicare Other | Admitting: Oncology

## 2023-10-13 ENCOUNTER — Other Ambulatory Visit: Payer: Self-pay

## 2023-10-14 ENCOUNTER — Encounter: Payer: Self-pay | Admitting: Oncology

## 2023-10-14 ENCOUNTER — Inpatient Hospital Stay: Payer: Medicare Other | Admitting: Oncology

## 2023-10-14 ENCOUNTER — Inpatient Hospital Stay: Payer: Medicare Other

## 2023-10-14 ENCOUNTER — Telehealth: Payer: Self-pay

## 2023-10-14 NOTE — Telephone Encounter (Signed)
 Spoke to pt and informed him of MD recommendations below. Pt verbalized understanding.

## 2023-10-14 NOTE — Telephone Encounter (Signed)
 Pt sent mychart message this morning stating that he is having fllu-like symptoms. Called pt back, no answer, but advised him to seek PCP/ urgent care visit for flu/ covid testing. Mychart message also sent. Asked pt to hold Xeloda  and let us  know about test results so we can reschedule appts.

## 2023-10-15 ENCOUNTER — Other Ambulatory Visit: Payer: Self-pay | Admitting: Oncology

## 2023-10-15 DIAGNOSIS — C20 Malignant neoplasm of rectum: Secondary | ICD-10-CM

## 2023-10-22 ENCOUNTER — Other Ambulatory Visit: Payer: Self-pay

## 2023-10-23 ENCOUNTER — Encounter: Payer: Self-pay | Admitting: Oncology

## 2023-11-04 ENCOUNTER — Encounter: Payer: Self-pay | Admitting: Oncology

## 2023-11-04 ENCOUNTER — Inpatient Hospital Stay: Payer: Medicare Other | Attending: Oncology

## 2023-11-04 ENCOUNTER — Other Ambulatory Visit: Payer: Self-pay

## 2023-11-04 ENCOUNTER — Inpatient Hospital Stay: Payer: Medicare Other

## 2023-11-04 ENCOUNTER — Inpatient Hospital Stay (HOSPITAL_BASED_OUTPATIENT_CLINIC_OR_DEPARTMENT_OTHER): Payer: Medicare Other | Admitting: Oncology

## 2023-11-04 VITALS — BP 132/91 | HR 97 | Temp 98.0°F | Resp 18 | Ht 68.0 in | Wt 181.0 lb

## 2023-11-04 DIAGNOSIS — Z86718 Personal history of other venous thrombosis and embolism: Secondary | ICD-10-CM | POA: Diagnosis not present

## 2023-11-04 DIAGNOSIS — I2699 Other pulmonary embolism without acute cor pulmonale: Secondary | ICD-10-CM | POA: Diagnosis not present

## 2023-11-04 DIAGNOSIS — C20 Malignant neoplasm of rectum: Secondary | ICD-10-CM | POA: Diagnosis present

## 2023-11-04 DIAGNOSIS — Z5112 Encounter for antineoplastic immunotherapy: Secondary | ICD-10-CM | POA: Diagnosis present

## 2023-11-04 DIAGNOSIS — Z5111 Encounter for antineoplastic chemotherapy: Secondary | ICD-10-CM

## 2023-11-04 DIAGNOSIS — E876 Hypokalemia: Secondary | ICD-10-CM | POA: Diagnosis not present

## 2023-11-04 DIAGNOSIS — Z95828 Presence of other vascular implants and grafts: Secondary | ICD-10-CM

## 2023-11-04 DIAGNOSIS — Z9221 Personal history of antineoplastic chemotherapy: Secondary | ICD-10-CM | POA: Diagnosis not present

## 2023-11-04 DIAGNOSIS — Z79899 Other long term (current) drug therapy: Secondary | ICD-10-CM | POA: Insufficient documentation

## 2023-11-04 DIAGNOSIS — Z923 Personal history of irradiation: Secondary | ICD-10-CM | POA: Diagnosis not present

## 2023-11-04 DIAGNOSIS — I10 Essential (primary) hypertension: Secondary | ICD-10-CM | POA: Insufficient documentation

## 2023-11-04 DIAGNOSIS — Z7901 Long term (current) use of anticoagulants: Secondary | ICD-10-CM | POA: Diagnosis not present

## 2023-11-04 LAB — CBC WITH DIFFERENTIAL (CANCER CENTER ONLY)
Abs Immature Granulocytes: 0.03 10*3/uL (ref 0.00–0.07)
Basophils Absolute: 0 10*3/uL (ref 0.0–0.1)
Basophils Relative: 1 %
Eosinophils Absolute: 0.2 10*3/uL (ref 0.0–0.5)
Eosinophils Relative: 3 %
HCT: 44.3 % (ref 39.0–52.0)
Hemoglobin: 15.7 g/dL (ref 13.0–17.0)
Immature Granulocytes: 1 %
Lymphocytes Relative: 15 %
Lymphs Abs: 0.7 10*3/uL (ref 0.7–4.0)
MCH: 33 pg (ref 26.0–34.0)
MCHC: 35.4 g/dL (ref 30.0–36.0)
MCV: 93.1 fL (ref 80.0–100.0)
Monocytes Absolute: 0.7 10*3/uL (ref 0.1–1.0)
Monocytes Relative: 15 %
Neutro Abs: 3.1 10*3/uL (ref 1.7–7.7)
Neutrophils Relative %: 65 %
Platelet Count: 211 10*3/uL (ref 150–400)
RBC: 4.76 MIL/uL (ref 4.22–5.81)
RDW: 14.9 % (ref 11.5–15.5)
WBC Count: 4.8 10*3/uL (ref 4.0–10.5)
nRBC: 0.4 % — ABNORMAL HIGH (ref 0.0–0.2)

## 2023-11-04 LAB — CMP (CANCER CENTER ONLY)
ALT: 15 U/L (ref 0–44)
AST: 17 U/L (ref 15–41)
Albumin: 3.7 g/dL (ref 3.5–5.0)
Alkaline Phosphatase: 98 U/L (ref 38–126)
Anion gap: 8 (ref 5–15)
BUN: 14 mg/dL (ref 6–20)
CO2: 21 mmol/L — ABNORMAL LOW (ref 22–32)
Calcium: 8.5 mg/dL — ABNORMAL LOW (ref 8.9–10.3)
Chloride: 110 mmol/L (ref 98–111)
Creatinine: 1.1 mg/dL (ref 0.61–1.24)
GFR, Estimated: >60 mL/min (ref >60)
Glucose, Bld: 145 mg/dL — ABNORMAL HIGH (ref 70–99)
Potassium: 4 mmol/L (ref 3.5–5.1)
Sodium: 139 mmol/L (ref 135–145)
Total Bilirubin: 0.5 mg/dL (ref 0.0–1.2)
Total Protein: 7.4 g/dL (ref 6.5–8.1)

## 2023-11-04 LAB — MAGNESIUM: Magnesium: 2.1 mg/dL (ref 1.7–2.4)

## 2023-11-04 MED ORDER — HEPARIN SOD (PORK) LOCK FLUSH 100 UNIT/ML IV SOLN
500.0000 [IU] | Freq: Once | INTRAVENOUS | Status: AC | PRN
  Administered 2023-11-04: 500 [IU]
  Filled 2023-11-04: qty 5

## 2023-11-04 MED ORDER — ALTEPLASE 2 MG IJ SOLR
2.0000 mg | Freq: Once | INTRAMUSCULAR | Status: AC
  Administered 2023-11-04: 2 mg
  Filled 2023-11-04: qty 2

## 2023-11-04 MED ORDER — PANITUMUMAB CHEMO INJECTION 100 MG/5ML
9.0000 mg/kg | Freq: Once | INTRAVENOUS | Status: DC
  Filled 2023-11-04: qty 35

## 2023-11-04 MED ORDER — SODIUM CHLORIDE 0.9 % IV SOLN
INTRAVENOUS | Status: DC
  Filled 2023-11-04: qty 250

## 2023-11-04 MED ORDER — LIDOCAINE-PRILOCAINE 2.5-2.5 % EX CREA
1.0000 | TOPICAL_CREAM | CUTANEOUS | 2 refills | Status: AC | PRN
  Filled 2023-11-04: qty 30, 15d supply, fill #0
  Filled 2023-12-16: qty 30, 30d supply, fill #0
  Filled 2024-01-05: qty 30, 15d supply, fill #0

## 2023-11-04 NOTE — Progress Notes (Signed)
 Patient here for vectibix infusion. Infusion was delayed due to not having the medication and having to receive the dose from [facility]. At 1145 the medication was on its way but had not arrived. Pt stated he had to go pick his granddaughter up at 1230 and was unable to stay for infusion today. Pharmacy notified and care team notified. Pt was rescheduled to receive treatment tomorrow on 2/26 at 1000. Pt educated about rescheduling appointment and was stable at discharge.

## 2023-11-04 NOTE — Assessment & Plan Note (Addendum)
 His diastolic BP is chronically elevated.  Previously discussed about starting BP medication and he declines.  BP has improved. monitor

## 2023-11-04 NOTE — Progress Notes (Signed)
 Hematology/Oncology Progress note [phone] [phone]      CHIEF COMPLAINTS/REASON FOR VISIT:  Follow up for recurrent rectal cancer treatment  ASSESSMENT & PLAN:   Cancer Staging  Rectal cancer [facility]) Staging form: Colon and Rectum, AJCC 8th Edition - Clinical stage from 02/08/2021: Stage IIIB (cT4a, cN1, cM0) - Signed by [name], MD on 03/06/2021   Rectal cancer [facility]) locally advanced rectal cancer-Stage IIIB, s/p TNT neoadjuvant protocol, concurrent Xeloda and radiation-Finished 08/27/2021, s/p  APR,rectal adenocarcinoma, ypT3N0. Positive radial margin due to perforation. Biopsy proven recurrent rectal cancer, locally advanced disease, possible colovesical fistula; no distant metastatic disease on PET- per [name] Surgeon Dr. Luciano Cutter, recurrence is not resectable. --> June 2024 S/p concurrent Xeloda 825 mg/m2 twice daily  with radiation --> July 2024 MRI pelvis w wo contrast showed persistent disease. Disease is not resectable per colorectal surgeon --> FOLFIRI Panitumumab --> Dec 2025 partial response.  Labs are reviewed and discussed with patient.  Proceed with cycle 2 Xeloda 850 mg/m2 twice daily on days 1 to 14 of a 21-day cycle plus Panitumumab Q3 weeks. [Panel study regimen].  He tolerates regimen well.   Acute pulmonary embolism (HCC) Continue  Xarelto 20mg  daily.  Hypomagnesemia Recommend patient to continue slow Mag 1 tab daily.   Encounter for antineoplastic chemotherapy Chemotherapy plan as listed  Hypertension His diastolic BP is chronically elevated.  Previously discussed about starting BP medication and he declines.  BP has improved. monitor  Hypokalemia Continue  potassium to in AM and in PM.       Orders Placed This Encounter  Procedures   Magnesium    Standing Status:   Future    Expected Date:   11/25/2023    Expiration Date:   11/24/2024   Ok to seek dental care. Given that he is on chemotherapy, I recommend him to  notify us if any upcoming invasive dental work. I recommend blood work prior to that.    Follow-up  3 weeks All questions were answered. The patient knows to call the clinic with any problems, questions or concerns.  [name], MD, PhD [facility] Hematology Oncology 11/04/2023        HISTORY OF PRESENTING ILLNESS:   Barry [name]. is a  58 y.o.  male presents for rectal cancer Oncology History  Rectal cancer (HCC)  02/08/2021 Initial Diagnosis   Rectal cancer   02/05/2021-02/06/2021 patient was hospitalized due to generalized weakness, intermittent lightheadedness, weight loss and worsening constipation.  02/05/2021 CT abdomen showed concerning of severe rectal wall thickening and right internal iliac lymph node concerning for metastatic disease.  Patient was seen by gastroenterology and had colonoscopy which showed a circumferential fungating mass in the rectum.  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   02/14/2021-02/16/2021 hospitalized due to rectal bleeding and pain.   02/14/2021 CT showed perirectal fluid collection/gas concerning for infection with significant leukocytosis, anemia with hemoglobin of 6.8.  Patient received PRBC transfusion, IV antibiotics.  He underwent an IR guided placement of JP drain into the rectal abscess.  Discharged home with oral Augmentin. 02/20/2021 presented to ER with new skin opening and draining from left gluteus.  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  JP drain has been removed.  Patient was continued on Augmentin.   02/13/2021, PET scan showed locally advanced rectal cancer with billowing of the mesorectum now with low attenuation material that was not present on previous examination with extensive stranding and inflammation.   Bulky  RIGHT pelvic sidewall/hypogastric lymph node outside of the mesorectum with stippled calcification measuring 19 mm-no increased metabolic activity. Small LEFT hypogastric lymph node just  peripheral to the internal external bifurcation-SUV 3.2 High RIGHT internal just below or at the internal/common iliac transition, lymph node -SUV 4.8 LEFT high hypogastric lymph node  8 mm-SUV 3. Scattered lymph nodes throughout the retroperitoneum with low FDG uptake Bilateral inguinal lymph nodes largest on the RIGHT (image 248/3) 11 mm with a maximum SUV of 2.8 spiculated nodule in the LEFT upper lobe- 9 x 8 mm       02/14/2021, CT abdomen pelvis without contrast Showed perforated rectal mass with contained perforation with fluid and gas extending above and below the pelvic floor,potentially involving the sphincter complex and extending into LEFT ischial rectal fossa   02/20/2021, CT pelvis with contrast showed Large perirectal/perianal abscess has markedly decreased in size since placement of the percutaneous drain. There are residual gas-filled collections in the soft tissues and suspect there is a fistula or sinus tract between the rectal mass and the subcutaneous tissues. Soft tissue gas along the medial left buttock and concern for a cutaneous ulceration in this area. Large rectal mass with evidence for a large necrotic right pelvic lymph node.   His case is complicated with a perirectal abscess. Prior to onset of perirectal abscess, on his 02/05/2021 scan, he was noted to have right internal iliac lymph node 3 x 1 x 2.3 cm which is concerning for nodal disease.On Subsequent images it was difficult to distinguish whether lymphadenopathy was due to nodal disease versus acute inflammation. 03/07/2021 MRI pelvis cT3 N2. Due to the possible contained perforation on previous CT, possible cT4 disease.    02/27/2021 medi port placed by Dr.Dew 03/13/2021 reports right butt cheek and also left perianal area fullness.he was seen by Dr.Pabon urgently  and had right buttock abscess drained. Left perianal fullness was felt to be due to cancer.    02/08/2021 Cancer Staging   Staging form: Colon and Rectum, AJCC  8th Edition - Clinical stage from 02/08/2021: Stage IIIB (cT4a, cN1, cM0) - Signed by [name], MD on 03/06/2021 Stage prefix: Initial diagnosis   02/27/2021 Procedure   medi port placed by Dr.Dew   03/18/2021 - 07/05/2021 Chemotherapy    FOLFOX q14d x 4 months      07/17/2021 - 08/27/2021 Chemotherapy   Xeloda concurrent with Radiation   11/18/2021 Surgery   patient is status post open APR with flap for rectal cancer. Pathology rectal adenomacarcinoma ypT3N0.  Positive margin: Radial (circumferential) or mesenteric: adjacent to perforation      01/27/2022 - 02/02/2022 Hospital Admission   Hospitalized at [facility] due to recurrent abscess.  US guided drain into the pelvic abscess with return of 60 ml of purulent fluid. Cultures positive for staph aureus and strep agalactiae group b, fungal cultures negative.    02/25/2022 Imaging   CT chest angiogram with and without contrast, CT abdomen pelvis with and without contrast 1. Negative for acute pulmonary embolus.2. Emphysema. Further decrease in size of the previously noted irregular nodule in the left upper lobe which is now barely measurable today. No new suspicious lung nodules 3. Status post left lower quadrant colostomy. Further decrease in size since comparison exam from May of the rim enhancing gas and fluid collection at the pelvic surgical bed extending from the perineum superiorly into the pelvis 4. Slightly thickened appearance of terminal ileal small bowel loops in the pelvis with mild stranding suggesting small bowel  inflammatory process. 5. Stable enlarged right pelvic sidewall lymph node   06/02/2022 Imaging   CT chest abdomen pelvis w contrast 1. Status post abdominal perineal resection with descending colostomy. 2. At the site of pelvic fluid and gas collection on 02/25/2022, there is residual, decreased presacral soft tissue fullness, without drainable collection. 3. Similar right obturator nodal metastasis.4.  No acute process or  evidence of metastatic disease in the chest. 5. Age advanced coronary artery atherosclerosis. Recommend assessment of coronary risk factors. 6. Aortic atherosclerosis and emphysema     06/26/2022 Imaging   CT abdomen pelvis with contrast showed 1. Interval increase in size of a lobulated partially imaged at least 7.8 x 3.8 x 12 cm abscess in the perianal region in a patient status post abdominal perineal resection and left lower lobe end colostomy formation. Finding extends to involve the pre sacral region up to the urinary dome level. Associated posterior urinary bladder wall thickening with lack of intraperitoneal fat plane between the presacral soft tissue thickening/abscess formation suggestive of possible fistulization and invasion of the posterior bladder wall. Underlying recurrent malignancy is not excluded. 2. Stable 2.7 cm right pelvic sidewall lymph node. 3.  Aortic Atherosclerosis   06/26/2022 - 06/27/2022 Hospital Admission   He went to [facility] ER and CT showed recurrent abscess. He was transferred to [facility] due to recurrent abscess, treated with IV vanc/zosyn, IR was consulted and drainage tube was placed. Wound grew up Strep viridans. Patient was seen by ID at [facility] discharged with Augmentin for 2 weeks.   He was seen by wound care Dr> Kandace Blitz on 07/09/2022, JP drain was removed.    09/29/2022 Imaging   MRI pelvis without contrast showed  Interval abdominoperitoneal resection since prior MRI. Decreased small fluid collection in the surgical bed compared with more recent CT, consistent with resolving postoperative fluid collection or abscess.   Bulky rounded presacral mass, which shows diffuse restricted diffusion, without significant change in size since most recent CT of 06/26/2022. This raises suspicion for recurrent carcinoma over post treatment changes. Recommend correlation with CEA level, and consider tissue sampling or PET-CT.   Stable enlarged right pelvic sidewall lymph  node. No new or increased adenopathy identified.   10/09/2022 Imaging   CT chest with contrast showed 1. Unchanged 0.4 cm fissural nodule of the anterior left lower lobe. This is almost certainly a benign fissural lymph node. No new or suspicious pulmonary nodules. 2. Moderate emphysema and diffuse bilateral bronchial wall thickening. 3. Coronary artery disease.   10/13/2022 Imaging   CT abdomen pelvis with contrast showed 1. Complex collection persists in the lower pelvis, extending from the anal verge upwards into the presacral space and anteriorly from the presacral space to the bladder dome, not significantly changed in size or extent compared to the earlier CT of 06/26/2022. This is most likely a combination of a postoperative seroma and/or chronic phlegmon/abscess versus recurrent abscess. 2. Bladder walls are thick walled/edematous. Given the contiguity of the presacral collection and the bladder dome, this is highly suspicious for a related bladder wall infection and possibly secondary to colovesical fistula. Recommend correlation with urinalysis. 3. No evidence of bowel obstruction. LEFT lower abdominal wall colostomy, without obstruction or inflammatory change. 4. No free intraperitoneal air   10/13/2022 [facility] Admission   Hospitalized due to fever and rectal pain. Urine culture showed mixed urogenital flora. IR CT guided Aspiration of abscess showed Streptococcal Viridans. He was placed back on Augmentin  10/14/2023 CT read by [facility]  radiology Postsurgical changes of abdominoperineal resection. Interval enlargement of  fluid collection in the surgical bed. Multiple foci of enhancing tissue at  the margins of this fluid collection, increased from prior study, suspicious for local recurrence.   Prominent retroperitoneal lymph nodes, some which are increased in size from prior study. These are indeterminate and may be either reactive or represent metastatic disease. Recommend continued  attention on follow-up.   Centrally hypoattenuating right obturator lymph node, unchanged in size, consistent with treated disease.   Circumferential bladder wall thickening and irregularity, most pronounced on the left side. This may be reactive in the setting of adjacent pelvic inflammatory changes. Correlate with urinalysis if there is clinical  concern for urinary tract infection.    11/14/2022 Imaging   MRI pelvis w wo contrast  1. Status post abdominoperineal resection with left lower quadrant end colostomy. 2. Compared to prior CT and MR, no significant change in appearance of heterogeneous, rim enhancing presacral and low pelvic soft tissue and fluid. Largest heterogeneously enhancing conglomerate of fluid and soft tissue appears to closely involve the anteriorly abutting the seminal vesicles and measures 3.0 x 2.9 cm in largest axial dimension. This extends to superiorly contact the posterior bladder dome and inferiorly towards the gluteal cleft. 3. Discrete fluid component at the most inferior extent measuring 2.4 x 1.3 cm. This is markedly diminished in volume compared to more remote previous examinations, for example 06/26/2022. 4. Constellation of findings is highly concerning for locally recurrent rectal malignancy with or without superimposed infection. Presence or absence of infection at this time is not established by MR. 5. Additional unchanged hemorrhagic or proteinaceous fluid collection in the right hemipelvis measuring 3.2 x 2.6 cm most consistent with postoperative hematoma or seroma. 6. Unchanged thickening of the urinary bladder wall. As previously reported, fistula or involvement of bladder wall by malignancy not excluded.   12/09/2022 Procedure   CT guided biopsy of the pelvic mass showed Moderately differentiated adenocarcinoma with dirty necrosis, morphologically identical to patient's prior rectal adenocarcinoma.   [facility] NGS xT 648 panel showed MLH3 start loss,  BCORL1  frame shift, TP53 splice region varian, APC stop gain,  TMB 7.9, MS stable, KRAS/BRAF/NRAS negative.   [facility] N[facility] xR showed no gene arrangement or reportable altered splicing events in RNA sequencing      01/07/2023 -  Chemotherapy   Xeloda 825 mg/m2 twice daily  with radiation   02/11/2023 Imaging   CT abdomen pelvis w contrast  1. Examination is positive for small bowel obstruction. Transition point is identified within the left iliac fossa. Distal to the transition point the small bowel loops exhibit mucosal enhancement and wall thickening concerning for enteritis. 2. There is new right-sided hydronephrosis and hydroureter up to the level of the bifurcation of right common iliac artery. The distal right ureter appears closely associated with the previously characterized tracer avid presacral soft tissue mass. Cannot exclude obstructive uropathy secondary to tumor involvement. 3. Similar appearance of presacral soft tissue mass. This was tracer avid on the recent PET-CT from 12/17/2022, and concerning for locally recurrent tumor. 4. Unchanged diffuse circumferential wall thickening involving the urinary bladder. 5. Aortic Atherosclerosis    04/17/2023 - 07/29/2023 Chemotherapy   Patient is on Treatment Plan : COLORECTAL FOLFIRI + Panitumumab q14d     08/14/2023 Imaging   CT chest abdomen pelvis w contrast showed  Heterogeneous central pelvic mass in the area of the rectum overall similar configuration to the previous CT scan. Again the lesion abuts  the bladder in the area of the seminal vesicles. Extension up along the presacral space. Surgical changes from diverting colostomy.   Essentially stable right pelvic sidewall soft tissue mass or nodal enlargement. No new nodal enlargement elsewhere in the chest, abdomen or pelvis.   Fatty liver infiltration.   Subtle nodular opacity along the right lung base. Infiltrates possible. Please correlate with symptoms and recommend follow-up.    Incidental right lower lobe segmental and lobar pulmonary embolism.     09/23/2023 -  Chemotherapy   Patient is on Treatment Plan : COLORECTAL Xeloda + Panitumumab q21d       Patient presented to emergency room on 12/08/21, rectal pain.  CT scan showed 18.3 cm fluid and gas collection in the pelvic surgical bed compatible with infected collection/abscess.  Patient was sent to [facility] and admitted for.  Patient has CT-guided drain placement on 12/09/2021.  Blood culture positive for Staph epidermidis which was felt to be contaminant.  Fluid culture grew pansensitive Staph aureus, strep pyogenes and candida albicans.  Patient was treated with IV antibiotics and transition to p.o. Augmentin and fluconazole on 12/11/2021.  #Perirectal abscess status post drainage catheter , patient was recently seen by [facility] oncology surgeon on 12/24/2021 and was recommended to keep drainage catheter and continue antibiotics. Patient had a repeat CT scan done which showed a smaller but persistent abscess.  He will return to [facility] surgery for follow-up of drain removal. His case was discussed on [name] tumor board for the positive mesenteric margin which was felt to be secondary to previous perforation.  Recommend surveillance.  02/11/23 patient was admitted due to nausea, CT showed small bowel obstruction, severe constipation, NG tube was placed. His symptoms improved and has ostomy output, NG was removed and he tolerated PO.   During the interval ER visit due to abdominal pain.  03/02/23 CT abdomen pelvis w contrast showed . Findings are again suggestive of probable partial small bowel obstruction which appears likely related to enteritis. Specifically, there are multiple areas of mural thickening throughout the distal small bowel with increased mucosal and mural enhancement, with some very mild proximal small bowel dilatation. 2. Status post APR with left lower quadrant colostomy and large soft tissue mass in the low anatomic  pelvis which is slightly more prominent than the recent prior study, and was previously hypermetabolic on prior PET-CT 12/17/2022, suggesting residual disease. 3. Probable nodal mass along the right pelvic sidewall in the right internal iliac nodal distribution, similar to the prior study, previously not hypermetabolic on prior PET-CT. No other lymphadenopathy or definitive signs of metastatic disease noted elsewhere on today's examination.  4. Aortic atherosclerosis, as well as calcified atherosclerotic plaque in the right coronary artery. Please note that although the presence of coronary artery calcium documents the presence of coronary artery disease, the severity of this disease and any potential stenosis cannot be assessed on this non-gated CT examination. Assessment for potential risk factor modification, dietary therapy or pharmacologic therapy may be warranted, if clinically indicated. 5. Additional incidental findings, as above. Patient has radiation induced enteritis. His symptoms were mild he was discharged home.   INTERVAL HISTORY Barry [name]. is a 58 y.o. male who has above history reviewed by me today presents for follow up visit for management of recurrent locally advanced rectal cancer. Denies fever chills, rectal pain. Denies rectal discharge.  Denies nausea vomiting or abdominal pain.   There is delay of cycle 2 treatment due to initially he had URI symptoms,  and then due to inclement weather. Today he feels well.  He reports tooth problem and needs to see dentist.     Review of Systems  Constitutional:  Positive for fatigue. Negative for appetite change, chills, fever and unexpected weight change.  HENT:   Negative for hearing loss and voice change.   Eyes:  Negative for eye problems and icterus.  Respiratory:  Negative for chest tightness, cough and shortness of breath.   Cardiovascular:  Negative for chest pain and leg swelling.  Gastrointestinal:  Negative for  abdominal distention, abdominal pain, blood in stool, constipation, diarrhea and rectal pain.  Endocrine: Negative for hot flashes.  Genitourinary:  Negative for difficulty urinating, dysuria and frequency.   Musculoskeletal:  Negative for arthralgias.  Skin:  Negative for itching and rash.  Neurological:  Negative for light-headedness and numbness.  Hematological:  Negative for adenopathy. Does not bruise/bleed easily.  Psychiatric/Behavioral:  Negative for confusion.     MEDICAL HISTORY:  Past Medical History:  Diagnosis Date   Headache    Left shoulder pain    Rectal cancer (HCC)     SURGICAL HISTORY: Past Surgical History:  Procedure Laterality Date   COLON SURGERY     COLONOSCOPY WITH PROPOFOL N/A 02/06/2021   Procedure: COLONOSCOPY WITH PROPOFOL;  Surgeon: Toney Reil, MD;  Location: [facility] ENDOSCOPY;  Service: Gastroenterology;  Laterality: N/A;   PORTA CATH INSERTION N/A 02/27/2021   Procedure: PORTA CATH INSERTION;  Surgeon: Annice Needy, MD;  Location: [facility] INVASIVE CV LAB;  Service: Cardiovascular;  Laterality: N/A;   ROTATOR CUFF REPAIR Left     SOCIAL HISTORY: Social History   Socioeconomic History   Marital status: Married    Spouse name: Not on file   Number of children: Not on file   Years of education: Not on file   Highest education level: Not on file  Occupational History   Not on file  Tobacco Use   Smoking status: Former    Current packs/day: 0.00    Average packs/day: 1 pack/day for 10.0 years (10.0 ttl pk-yrs)    Types: Cigars, Cigarettes    Start date: 02/05/2011    Quit date: 02/04/2021    Years since quitting: 2.7   Smokeless tobacco: Never  Substance and Sexual Activity   Alcohol use: Not Currently   Drug use: No   Sexual activity: Not on file  Other Topics Concern   Not on file  Social History Narrative   Not on file   Social Drivers of Health   Financial Resource Strain: High Risk (02/13/2023)   Received from [facility], [facility]   Overall Financial Resource Strain (CARDIA)    Difficulty of Paying Living Expenses: Hard  Food Insecurity: No Food Insecurity (02/13/2023)   Received from [facility], [facility]   Hunger Vital Sign    Worried About Running Out of Food in the Last Year: Never true    Ran Out of Food in the Last Year: Never true  Transportation Needs: No Transportation Needs (02/13/2023)   Received from [facility], [facility]   [facility] - Transportation    In the past 12 months, has lack of transportation kept you from medical appointments or from getting medications?: No    Lack of Transportation (Non-Medical): No  Physical Activity: Inactive (11/03/2022)   Exercise Vital Sign    Days of Exercise per Week: 0 days  Minutes of Exercise per Session: 0 min  Stress: Stress Concern Present (11/03/2022)   [employer] of Occupational Health - Occupational Stress Questionnaire    Feeling of Stress : Rather much  Social Connections: Moderately Isolated (11/03/2022)   Social Connection and Isolation Panel [NHANES]    Frequency of Communication with Friends and Family: Twice a week    Frequency of Social Gatherings with Friends and Family: Three times a week    Attends Religious Services: 1 to 4 times per year    Active Member of Clubs or Organizations: No    Attends Banker Meetings: Never    Marital Status: Separated  Intimate Partner Violence: Not At Risk (11/03/2022)   Humiliation, Afraid, Rape, and Kick questionnaire    Fear of Current or Ex-Partner: No    Emotionally Abused: No    Physically Abused: No    Sexually Abused: No    FAMILY HISTORY: Family History  Problem Relation Age of Onset   Cancer Sister    Diabetes Mother    Cancer Maternal Grandmother    Cancer Paternal Grandmother     ALLERGIES:  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  MEDICATIONS:  Current  Outpatient Medications  Medication Sig Dispense Refill   calcium-vitamin D (OSCAL WITH D) 500-5 MG-MCG tablet Take 2 tablets by mouth daily. 60 tablet 3   capecitabine (XELODA) 150 MG tablet TAKE 1 TABLET BY MOUTH 2 TIMES A DAY AFTER A MEAL FOR 14 DAYS ON, THEN 7 DAYS OFF, REPEAT EVERY 21 DAYS. (TOTAL DOSE OF 1,650 MG 2 TIMES A DAY) 28 tablet 1   capecitabine (XELODA) 500 MG tablet TAKE 3 TABLETS BY MOUTH 2 TIMES A DAY AFTER A MEAL FOR 14 DAYS ON, THEN 7 DAYS OFF, REPEAT EVERY 21 DAYS. (TOTAL DOSE OF 1,650 MG 2 TIMES A DAY) 84 tablet 1   citalopram (CELEXA) 10 MG tablet Take 1 tablet (10 mg total) by mouth daily. 30 tablet 3   clindamycin (CLINDAGEL) 1 % gel Apply topically 2 (two) times daily. 30 g 4   loperamide (IMODIUM) 2 MG capsule Take 1 capsule (2 mg total) by mouth See admin instructions. 90 capsule 2   magnesium chloride (SLOW-MAG) 64 MG TBEC SR tablet Take 1 tablet (64 mg total) by mouth daily. 30 tablet 1   ondansetron (ZOFRAN) 8 MG tablet Take 1 tablet (8 mg total) by mouth every 8 (eight) hours as needed for nausea or vomiting. 90 tablet 1   potassium chloride SA (KLOR-CON M) 20 MEQ tablet Take 2 tablets (40 mEq total) by mouth in the morning and take 1 tablet ( ) in the evening. 60 tablet 2   prochlorperazine (COMPAZINE) 10 MG tablet Take 1 tablet (10 mg total) by mouth every 6 (six) hours as needed for nausea or vomiting. 90 tablet 1   rivaroxaban (XARELTO) 20 MG TABS tablet Take 1 tablet (20 mg total) by mouth daily with supper. 30 tablet 4   senna (SENOKOT) 8.6 MG TABS tablet Take 2 tablets (17.2 mg total) by mouth daily. 100 tablet 0   traZODone (DESYREL) 50 MG tablet Take 1 tablet (50 mg total) by mouth at bedtime as needed for sleep. 30 tablet 3   lidocaine-prilocaine (EMLA) cream Apply 1 Application topically as needed. Apply small amount to port and cover with saran wrap 1-2 hours prior to port access 30 g 2   No current facility-administered medications for this visit.    Facility-Administered Medications Ordered in Other Visits  Medication Dose  Route Frequency Provider Last Rate Last Admin   0.9 %  sodium chloride infusion   Intravenous Continuous [name], MD   Stopped at 11/04/23 1130   heparin lock flush 100 unit/mL  500 Units Intravenous Once [name], MD       panitumumab (VECTIBIX) 700 mg in sodium chloride 0.9 % 100 mL chemo infusion  9 mg/kg (Order-Specific) Intravenous Once [name], MD         PHYSICAL EXAMINATION: ECOG PERFORMANCE STATUS: 1 - Symptomatic but completely ambulatory Vitals:   11/04/23 0918 11/04/23 0919  BP: (!) 141/93 (!) 132/91  Pulse: 97   Resp: 18   Temp: 98 F (36.7 C)   SpO2: 99%      Filed Weights   11/04/23 0918  Weight: 181 lb (82.1 kg)       Physical Exam HENT:     Head: Normocephalic and atraumatic.  Eyes:     General: No scleral icterus. Cardiovascular:     Rate and Rhythm: Normal rate and regular rhythm.     Heart sounds: Normal heart sounds.  Pulmonary:     Effort: Pulmonary effort is normal. No respiratory distress.     Breath sounds: No wheezing.     Comments: Decreased breath sound bilaterally.  Abdominal:     General: Bowel sounds are normal. There is no distension.     Palpations: Abdomen is soft.     Comments: Colostomy bag  Genitourinary:    Comments: History of APR, Musculoskeletal:        General: No deformity. Normal range of motion.     Cervical back: Normal range of motion and neck supple.  Skin:    General: Skin is warm and dry.     Findings: No erythema.  Neurological:     Mental Status: He is alert and oriented to person, place, and time. Mental status is at baseline.     Cranial Nerves: No cranial nerve deficit.  Psychiatric:        Mood and Affect: Mood normal.     LABORATORY DATA:  I have reviewed the data as listed    Latest Ref Rng & Units 11/04/2023    8:24 AM 10/02/2023    9:36 AM 09/23/2023    8:49 AM  CBC  WBC 4.0 - 10.5 K/uL 4.8  4.3  4.3    Hemoglobin 13.0 - 17.0 g/dL 16.1  09.6  04.5   Hematocrit 39.0 - 52.0 % 44.3  43.4  41.1   Platelets 150 - 400 K/uL 211  180  175       Latest Ref Rng & Units 11/04/2023    8:24 AM 10/02/2023    9:36 AM 09/23/2023    8:49 AM  CMP  Glucose 70 - 99 mg/dL 409  811  914   BUN 6 - 20 mg/dL 14  9  9    Creatinine 0.61 - 1.24 mg/dL 7.82  9.56  2.13   Sodium 135 - 145 mmol/L 139  136  138   Potassium 3.5 - 5.1 mmol/L 4.0  3.0  3.9   Chloride 98 - 111 mmol/L 110  101  107   CO2 22 - 32 mmol/L 21  27  23    Calcium 8.9 - 10.3 mg/dL 8.5  8.9  8.8   Total Protein 6.5 - 8.1 g/dL 7.4  7.4  7.1   Total Bilirubin 0.0 - 1.2 mg/dL 0.5  0.8  0.6   Alkaline Phos 38 - 126 U/L  98  76  75   AST 15 - 41 U/L 17  19  18    ALT 0 - 44 U/L 15  17  16       RADIOGRAPHIC STUDIES: I have personally reviewed the radiological images as listed and agreed with the findings in the report. CT CHEST ABDOMEN PELVIS W CONTRAST Result Date: 08/14/2023 CLINICAL DATA:  Rectal cancer staging.  * Tracking Code: BO *. EXAM: CT CHEST, ABDOMEN, AND PELVIS WITH CONTRAST TECHNIQUE: Multidetector CT imaging of the chest, abdomen and pelvis was performed following the standard protocol during bolus administration of intravenous contrast. RADIATION DOSE REDUCTION: This exam was performed according to the departmental dose-optimization program which includes automated exposure control, adjustment of the mA and/or kV according to patient size and/or use of iterative reconstruction technique. CONTRAST:  85mL OMNIPAQUE IOHEXOL 300 MG/ML  SOLN COMPARISON:  Pelvic MRI 03/30/2023. Abdomen and pelvis CT 03/02/2023 and older. PET-CT scan 12/17/2022. FINDINGS: CT CHEST FINDINGS Cardiovascular: Right upper chest port in place with the tip seen as far as the right atrium. The port is accessed. Heart is nonenlarged. No pericardial effusion. Thoracic aorta has a normal course and caliber with slight atherosclerotic change. Note is made of right lower lobe  pulmonary embolism in the anterior lower lobe such as series 2, image 35. Small amount of clot along the lobar vessels as well as seen on coronal image 75. No larger or additional central emboli identified. Mediastinum/Nodes: Slightly patulous thoracic esophagus. Preserved thyroid gland. No discrete abnormal lymph node enlargement present in the axillary regions, hilum or mediastinum. Lungs/Pleura: Centrilobular emphysematous changes identified. No pneumothorax. There is breathing motion. There is some patchy opacity identified along the right lower lobe. Atelectasis versus infiltrates possible. Recommend correlation with a particular symptoms. Follow up as clinically directed. There is a small nodule along the margin of the left interlobar fissure measuring 5 mm. Essentially unchanged from a chest CT of 10/09/2022. This has been stable since at least September 2023 as well. Musculoskeletal: Scattered degenerative changes. CT ABDOMEN PELVIS FINDINGS Hepatobiliary: Fatty liver infiltration. Patent portal vein. Gallbladder is present low-attenuation lesion posteroinferior along the right hepatic lobe previously measuring 13 mm is stable today on series 2, image 67 again likely a benign cystic lesion. Otherwise no clear space-occupying liver lesion at this time. Pancreas: Moderate atrophy of the pancreas without obvious mass. Spleen: Small spleen. Adrenals/Urinary Tract: Stable slight nonspecific thickening of the adrenal glands. No enhancing renal mass or collecting system dilatation. The ureters have normal course and caliber extending down to the underdistended urinary bladder. There are some tiny low-attenuation foci along the kidneys which are too small to completely characterize but likely benign cystic foci, Bosniak 2 lesions. No specific imaging follow-up of these renal foci Stomach/Bowel: Oral contrast was administered. The stomach is underdistended. Small bowel is nondilated. There is a left lower quadrant  colostomy. Proximal this there is moderate colonic stool. The colon is nondilated. Normal retrocecal appendix. In the area of the rectum is a focal heterogeneous mass abutting the posterior aspect of the urinary bladder. In the axial plane previous study measured of the lesion at 5.3 x 4.9 cm. When measuring in the same fashion at a similar level today 5.6 by 4.6 cm, similar. This abuts the adjacent musculature laterally. Cephalocaudal extension in the presacral space approaches 10.3 cm today and going back to the prior when measured in the same fashion 9.8 cm. Also abuts some small bowel loops in the central pelvis posteriorly. Involvement of  the seminal vesicles is possible. Vascular/Lymphatic: Diffuse vascular calcifications. Normal caliber aorta and IVC. Circumaortic left renal vein. No new lymph node enlargement identified in the upper abdomen. The right pelvic sidewall lesion previously measuring 2.8 x 3.2 cm, today on series 2, image 112 measures 3.3 x 2.6 cm. Similar. Reproductive: Poor definition of the prostate and seminal vesicles. Other: Stranding of the fat in the pelvis and lower abdomen. No free intra-air. No loculated fluid collections clearly seen. Musculoskeletal: Curvature of the spine. Scattered degenerative changes of the spine and pelvis. Scattered Schmorl's node changes. IMPRESSION: Heterogeneous central pelvic mass in the area of the rectum overall similar configuration to the previous CT scan. Again the lesion abuts the bladder in the area of the seminal vesicles. Extension up along the presacral space. Surgical changes from diverting colostomy. Essentially stable right pelvic sidewall soft tissue mass or nodal enlargement. No new nodal enlargement elsewhere in the chest, abdomen or pelvis. Fatty liver infiltration. Subtle nodular opacity along the right lung base. Infiltrates possible. Please correlate with symptoms and recommend follow-up. Incidental right lower lobe segmental and lobar  pulmonary embolism. Critical Value/emergent results were called by telephone at the time of interpretation on 08/14/2023 at 5:58 pm to provider Dr. Orlie Dakin, who verbally acknowledged these results. Electronically Signed   By: Karen Kays M.D.   On: 08/14/2023 17:59

## 2023-11-04 NOTE — Progress Notes (Signed)
 Pt here for follow up. Pt reports that teeth are breaking up and wants to know if he can go to dentist for work up.

## 2023-11-04 NOTE — Assessment & Plan Note (Signed)
 Continue  potassium to in AM and in PM.

## 2023-11-04 NOTE — Assessment & Plan Note (Signed)
 Chemotherapy plan as listed

## 2023-11-04 NOTE — Assessment & Plan Note (Signed)
 Recommend patient to continue slow Mag 1 tab daily.

## 2023-11-04 NOTE — Patient Instructions (Signed)
 [facility] - A DEPT OF [facility]  Discharge Instructions: Thank you for choosing [facility] to provide your oncology and hematology care.  If you have a lab appointment with the Cancer Center, please go directly to the Cancer Center and check in at the registration area.  Wear comfortable clothing and clothing appropriate for easy access to any Portacath or PICC line.   We strive to give you quality time with your provider. You may need to reschedule your appointment if you arrive late (15 or more minutes).  Arriving late affects you and other patients whose appointments are after yours.  Also, if you miss three or more appointments without notifying the office, you may be dismissed from the clinic at the provider's discretion.      For prescription refill requests, have your pharmacy contact our office and allow 72 hours for refills to be completed.    Today you received the following chemotherapy and/or immunotherapy agents vectibix      To help prevent nausea and vomiting after your treatment, we encourage you to take your nausea medication as directed.  BELOW ARE SYMPTOMS THAT SHOULD BE REPORTED IMMEDIATELY: *FEVER GREATER THAN 100.4 F (38 C) OR HIGHER *CHILLS OR SWEATING *NAUSEA AND VOMITING THAT IS NOT CONTROLLED WITH YOUR NAUSEA MEDICATION *UNUSUAL SHORTNESS OF BREATH *UNUSUAL BRUISING OR BLEEDING *URINARY PROBLEMS (pain or burning when urinating, or frequent urination) *BOWEL PROBLEMS (unusual diarrhea, constipation, pain near the anus) TENDERNESS IN MOUTH AND THROAT WITH OR WITHOUT PRESENCE OF ULCERS (sore throat, sores in mouth, or a toothache) UNUSUAL RASH, SWELLING OR PAIN  UNUSUAL VAGINAL DISCHARGE OR ITCHING   Items with * indicate a potential emergency and should be followed up as soon as possible or go to the Emergency Department if any problems should occur.  Please show the CHEMOTHERAPY ALERT CARD or IMMUNOTHERAPY  ALERT CARD at check-in to the Emergency Department and triage nurse.  Should you have questions after your visit or need to cancel or reschedule your appointment, please contact [facility] - A DEPT OF [facility]  [phone] and follow the prompts.  Office hours are 8:00 a.m. to 4:30 p.m. Monday - Friday. Please note that voicemails left after 4:00 p.m. may not be returned until the following business day.  We are closed weekends and major holidays. You have access to a nurse at all times for urgent questions. Please call the main number to the clinic [phone] and follow the prompts.  For any non-urgent questions, you may also contact your provider using MyChart. We now offer e-Visits for anyone 53 and older to request care online for non-urgent symptoms. For details visit [email or web address].   Also download the MyChart app! Go to the app store, search "MyChart", open the app, select [place], and log in with your MyChart username and password.

## 2023-11-04 NOTE — Assessment & Plan Note (Addendum)
 locally advanced rectal cancer-Stage IIIB, s/p TNT neoadjuvant protocol, concurrent Xeloda and radiation-Finished 08/27/2021, s/p  APR,rectal adenocarcinoma, [id number]. Positive radial margin due to perforation. Biopsy proven recurrent rectal cancer, locally advanced disease, possible colovesical fistula; no distant metastatic disease on PET- per [name] Surgeon Dr. Luciano Cutter, recurrence is not resectable. --> June 2024 S/p concurrent Xeloda 825 mg/m2 twice daily  with radiation --> July 2024 MRI pelvis w wo contrast showed persistent disease. Disease is not resectable per colorectal surgeon --> FOLFIRI Panitumumab --> Dec 2025 partial response.  Labs are reviewed and discussed with patient.  Proceed with cycle 2 Xeloda 850 mg/m2 twice daily on days 1 to 14 of a 21-day cycle plus Panitumumab Q3 weeks. [Panel study regimen].  He tolerates regimen well.

## 2023-11-04 NOTE — Assessment & Plan Note (Addendum)
 Continue Xarelto 20mg  daily.

## 2023-11-05 ENCOUNTER — Inpatient Hospital Stay: Payer: Medicare Other

## 2023-11-05 VITALS — BP 152/104 | HR 107 | Temp 96.4°F | Resp 19

## 2023-11-05 DIAGNOSIS — Z5112 Encounter for antineoplastic immunotherapy: Secondary | ICD-10-CM | POA: Diagnosis not present

## 2023-11-05 DIAGNOSIS — C20 Malignant neoplasm of rectum: Secondary | ICD-10-CM

## 2023-11-05 LAB — CEA: CEA: 1.6 ng/mL (ref 0.0–4.7)

## 2023-11-05 MED ORDER — SODIUM CHLORIDE 0.9 % IV SOLN
INTRAVENOUS | Status: DC
  Filled 2023-11-05: qty 250

## 2023-11-05 MED ORDER — SODIUM CHLORIDE 0.9 % IV SOLN
9.0000 mg/kg | Freq: Once | INTRAVENOUS | Status: AC
  Administered 2023-11-05: 700 mg via INTRAVENOUS
  Filled 2023-11-05: qty 15

## 2023-11-05 MED ORDER — HEPARIN SOD (PORK) LOCK FLUSH 100 UNIT/ML IV SOLN
500.0000 [IU] | Freq: Once | INTRAVENOUS | Status: AC | PRN
Start: 2023-11-05 — End: 2023-11-05
  Administered 2023-11-05: 500 [IU]
  Filled 2023-11-05: qty 5

## 2023-11-05 NOTE — Patient Instructions (Signed)
 [facility]  Discharge Instructions: Thank you for choosing [facility] to provide your oncology and hematology care.  If you have a lab appointment with the Cancer Center, please go directly to the Cancer Center and check in at the registration area.  Wear comfortable clothing and clothing appropriate for easy access to any Portacath or PICC line.   We strive to give you quality time with your provider. You may need to reschedule your appointment if you arrive late (15 or more minutes).  Arriving late affects you and other patients whose appointments are after yours.  Also, if you miss three or more appointments without notifying the office, you may be dismissed from the clinic at the provider's discretion.      For prescription refill requests, have your pharmacy contact our office and allow 72 hours for refills to be completed.    Today you received the following chemotherapy and/or immunotherapy agents Vectibix.      To help prevent nausea and vomiting after your treatment, we encourage you to take your nausea medication as directed.  BELOW ARE SYMPTOMS THAT SHOULD BE REPORTED IMMEDIATELY: *FEVER GREATER THAN 100.4 F (38 C) OR HIGHER *CHILLS OR SWEATING *NAUSEA AND VOMITING THAT IS NOT CONTROLLED WITH YOUR NAUSEA MEDICATION *UNUSUAL SHORTNESS OF BREATH *UNUSUAL BRUISING OR BLEEDING *URINARY PROBLEMS (pain or burning when urinating, or frequent urination) *BOWEL PROBLEMS (unusual diarrhea, constipation, pain near the anus) TENDERNESS IN MOUTH AND THROAT WITH OR WITHOUT PRESENCE OF ULCERS (sore throat, sores in mouth, or a toothache) UNUSUAL RASH, SWELLING OR PAIN  UNUSUAL VAGINAL DISCHARGE OR ITCHING   Items with * indicate a potential emergency and should be followed up as soon as possible or go to the Emergency Department if any problems should occur.  Please show the CHEMOTHERAPY ALERT CARD or IMMUNOTHERAPY  ALERT CARD at check-in to the Emergency Department and triage nurse.  Should you have questions after your visit or need to cancel or reschedule your appointment, please contact [facility] - A DEPT OF [facility]  [phone] and follow the prompts.  Office hours are 8:00 a.m. to 4:30 p.m. Monday - Friday. Please note that voicemails left after 4:00 p.m. may not be returned until the following business day.  We are closed weekends and major holidays. You have access to a nurse at all times for urgent questions. Please call the main number to the clinic [phone] and follow the prompts.  For any non-urgent questions, you may also contact your provider using MyChart. We now offer e-Visits for anyone 49 and older to request care online for non-urgent symptoms. For details visit [email or web address].   Also download the MyChart app! Go to the app store, search "MyChart", open the app, select [place], and log in with your MyChart username and password.

## 2023-11-06 ENCOUNTER — Other Ambulatory Visit: Payer: Self-pay

## 2023-11-16 ENCOUNTER — Other Ambulatory Visit: Payer: Self-pay

## 2023-11-18 ENCOUNTER — Encounter: Payer: Self-pay | Admitting: Oncology

## 2023-11-24 ENCOUNTER — Other Ambulatory Visit: Payer: Self-pay

## 2023-11-24 DIAGNOSIS — C20 Malignant neoplasm of rectum: Secondary | ICD-10-CM

## 2023-11-25 ENCOUNTER — Inpatient Hospital Stay: Payer: Medicare Other | Attending: Oncology

## 2023-11-25 ENCOUNTER — Ambulatory Visit: Payer: Medicare Other | Admitting: Oncology

## 2023-11-25 ENCOUNTER — Ambulatory Visit: Payer: Medicare Other

## 2023-11-25 ENCOUNTER — Inpatient Hospital Stay (HOSPITAL_BASED_OUTPATIENT_CLINIC_OR_DEPARTMENT_OTHER): Payer: Medicare Other | Admitting: Oncology

## 2023-11-25 ENCOUNTER — Other Ambulatory Visit: Payer: Medicare Other

## 2023-11-25 ENCOUNTER — Other Ambulatory Visit: Payer: Self-pay | Admitting: Oncology

## 2023-11-25 ENCOUNTER — Encounter: Payer: Self-pay | Admitting: Oncology

## 2023-11-25 ENCOUNTER — Inpatient Hospital Stay: Payer: Medicare Other

## 2023-11-25 VITALS — BP 133/102 | HR 94 | Temp 97.3°F | Resp 18 | Wt 182.1 lb

## 2023-11-25 DIAGNOSIS — R5383 Other fatigue: Secondary | ICD-10-CM | POA: Diagnosis not present

## 2023-11-25 DIAGNOSIS — K521 Toxic gastroenteritis and colitis: Secondary | ICD-10-CM | POA: Insufficient documentation

## 2023-11-25 DIAGNOSIS — C20 Malignant neoplasm of rectum: Secondary | ICD-10-CM | POA: Insufficient documentation

## 2023-11-25 DIAGNOSIS — T451X5A Adverse effect of antineoplastic and immunosuppressive drugs, initial encounter: Secondary | ICD-10-CM | POA: Insufficient documentation

## 2023-11-25 DIAGNOSIS — Z79899 Other long term (current) drug therapy: Secondary | ICD-10-CM | POA: Insufficient documentation

## 2023-11-25 DIAGNOSIS — Z86718 Personal history of other venous thrombosis and embolism: Secondary | ICD-10-CM | POA: Insufficient documentation

## 2023-11-25 DIAGNOSIS — Z923 Personal history of irradiation: Secondary | ICD-10-CM | POA: Insufficient documentation

## 2023-11-25 DIAGNOSIS — I10 Essential (primary) hypertension: Secondary | ICD-10-CM

## 2023-11-25 DIAGNOSIS — I2699 Other pulmonary embolism without acute cor pulmonale: Secondary | ICD-10-CM

## 2023-11-25 DIAGNOSIS — Z5112 Encounter for antineoplastic immunotherapy: Secondary | ICD-10-CM | POA: Insufficient documentation

## 2023-11-25 DIAGNOSIS — Z7901 Long term (current) use of anticoagulants: Secondary | ICD-10-CM | POA: Diagnosis not present

## 2023-11-25 DIAGNOSIS — Z5111 Encounter for antineoplastic chemotherapy: Secondary | ICD-10-CM

## 2023-11-25 DIAGNOSIS — E876 Hypokalemia: Secondary | ICD-10-CM | POA: Insufficient documentation

## 2023-11-25 LAB — CMP (CANCER CENTER ONLY)
ALT: 18 U/L (ref 0–44)
AST: 20 U/L (ref 15–41)
Albumin: 3.5 g/dL (ref 3.5–5.0)
Alkaline Phosphatase: 85 U/L (ref 38–126)
Anion gap: 5 (ref 5–15)
BUN: 12 mg/dL (ref 6–20)
CO2: 26 mmol/L (ref 22–32)
Calcium: 8.1 mg/dL — ABNORMAL LOW (ref 8.9–10.3)
Chloride: 105 mmol/L (ref 98–111)
Creatinine: 0.77 mg/dL (ref 0.61–1.24)
GFR, Estimated: >60 mL/min (ref >60)
Glucose, Bld: 187 mg/dL — ABNORMAL HIGH (ref 70–99)
Potassium: 3.2 mmol/L — ABNORMAL LOW (ref 3.5–5.1)
Sodium: 136 mmol/L (ref 135–145)
Total Bilirubin: 0.6 mg/dL (ref 0.0–1.2)
Total Protein: 7.2 g/dL (ref 6.5–8.1)

## 2023-11-25 LAB — CBC WITH DIFFERENTIAL (CANCER CENTER ONLY)
Abs Immature Granulocytes: 0.01 10*3/uL (ref 0.00–0.07)
Basophils Absolute: 0 10*3/uL (ref 0.0–0.1)
Basophils Relative: 1 %
Eosinophils Absolute: 0.1 10*3/uL (ref 0.0–0.5)
Eosinophils Relative: 3 %
HCT: 47 % (ref 39.0–52.0)
Hemoglobin: 16.7 g/dL (ref 13.0–17.0)
Immature Granulocytes: 0 %
Lymphocytes Relative: 24 %
Lymphs Abs: 1 10*3/uL (ref 0.7–4.0)
MCH: 33.1 pg (ref 26.0–34.0)
MCHC: 35.5 g/dL (ref 30.0–36.0)
MCV: 93.3 fL (ref 80.0–100.0)
Monocytes Absolute: 0.7 10*3/uL (ref 0.1–1.0)
Monocytes Relative: 17 %
Neutro Abs: 2.2 10*3/uL (ref 1.7–7.7)
Neutrophils Relative %: 55 %
Platelet Count: 192 10*3/uL (ref 150–400)
RBC: 5.04 MIL/uL (ref 4.22–5.81)
RDW: 14.5 % (ref 11.5–15.5)
WBC Count: 4 10*3/uL (ref 4.0–10.5)
nRBC: 0 % (ref 0.0–0.2)

## 2023-11-25 LAB — MAGNESIUM: Magnesium: 1.9 mg/dL (ref 1.7–2.4)

## 2023-11-25 MED ORDER — SODIUM CHLORIDE 0.9 % IV SOLN
INTRAVENOUS | Status: DC
  Filled 2023-11-25: qty 250

## 2023-11-25 MED ORDER — HEPARIN SOD (PORK) LOCK FLUSH 100 UNIT/ML IV SOLN
500.0000 [IU] | Freq: Once | INTRAVENOUS | Status: AC | PRN
  Administered 2023-11-25: 500 [IU]
  Filled 2023-11-25: qty 5

## 2023-11-25 MED ORDER — SODIUM CHLORIDE 0.9 % IV SOLN
2.0000 g | Freq: Once | INTRAVENOUS | Status: AC
Start: 2023-11-25 — End: 2023-11-25
  Administered 2023-11-25: 2 g via INTRAVENOUS
  Filled 2023-11-25: qty 20

## 2023-11-25 MED ORDER — SODIUM CHLORIDE 0.9 % IV SOLN
9.0000 mg/kg | Freq: Once | INTRAVENOUS | Status: AC
  Administered 2023-11-25: 700 mg via INTRAVENOUS
  Filled 2023-11-25: qty 15

## 2023-11-25 NOTE — Progress Notes (Signed)
 Hematology/Oncology Progress note [phone] [phone]      CHIEF COMPLAINTS/REASON FOR VISIT:  Follow up for recurrent rectal cancer treatment  ASSESSMENT & PLAN:   Cancer Staging  Rectal cancer [facility]) Staging form: Colon and Rectum, AJCC 8th Edition - Clinical stage from 02/08/2021: Stage IIIB (cT4a, cN1, cM0) - Signed by Barry Patience, MD on 03/06/2021   Rectal cancer [facility]) locally advanced rectal cancer-Stage IIIB, s/p TNT neoadjuvant protocol, concurrent Xeloda and radiation-Finished 08/27/2021, s/p  APR,rectal adenocarcinoma, ypT3N0. Positive radial margin due to perforation. Biopsy proven recurrent rectal cancer, locally advanced disease, possible colovesical fistula; no distant metastatic disease on PET- per [name] Surgeon Dr. Luciano Cutter, recurrence is not resectable. --> June 2024 S/p concurrent Xeloda 825 mg/m2 twice daily  with radiation --> July 2024 MRI pelvis w wo contrast showed persistent disease. Disease is not resectable per colorectal surgeon --> FOLFIRI Panitumumab --> Dec 2025 partial response. --> per pt's preference switched to Xeloda + Panitumumab.  Labs are reviewed and discussed with patient.  Proceed with cycle 3 Xeloda 850 mg/m2 twice daily on days 1 to 14 of a 21-day cycle plus Panitumumab Q3 weeks. [Panel study regimen].  He tolerates regimen well.   Acute pulmonary embolism (HCC) Continue  Xarelto 20mg  daily.  Encounter for antineoplastic chemotherapy Chemotherapy plan as listed  Hypertension His diastolic BP is chronically elevated.  Previously discussed about starting BP medication and he declines.  monitor  Hypokalemia Continue  potassium to in AM and in PM.   Hypomagnesemia Recommend patient to continue slow Mag 1 tab daily.   Hypocalcemia Continue calcium 1000mg  daily. He will get IV calcium gluconate 2g x 1.  Check Vitamin D level  Chemotherapy induced diarrhea Recommend imodium PRN as directed.        Orders Placed This Encounter  Procedures   CT CHEST ABDOMEN PELVIS W CONTRAST    Standing Status:   Future    Expected Date:   12/09/2023    Expiration Date:   11/24/2024    If indicated for the ordered procedure, I authorize the administration of contrast media per Radiology protocol:   Yes    Does the patient have a contrast media/X-ray dye allergy?:   No    Preferred imaging location?:   [facility]    If indicated for the ordered procedure, I authorize the administration of oral contrast media per Radiology protocol:   Yes   Vitamin D 25 hydroxy    Standing Status:   Future    Expected Date:   11/25/2023    Expiration Date:   11/24/2024    Follow-up  3 weeks All questions were answered. The patient knows to call the clinic with any problems, questions or concerns.  Barry Patience, MD, PhD [facility] Hematology Oncology 11/25/2023        HISTORY OF PRESENTING ILLNESS:   Barry [name]. is a  58 y.o.  male presents for rectal cancer Oncology History  Rectal cancer (HCC)  02/08/2021 Initial Diagnosis   Rectal cancer   02/05/2021-02/06/2021 patient was hospitalized due to generalized weakness, intermittent lightheadedness, weight loss and worsening constipation.  02/05/2021 CT abdomen showed concerning of severe rectal wall thickening and right internal iliac lymph node concerning for metastatic disease.  Patient was seen by gastroenterology and had colonoscopy which showed a circumferential fungating mass in the rectum.  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   02/14/2021-02/16/2021 hospitalized due to rectal bleeding and pain.   02/14/2021 CT showed  perirectal fluid collection/gas concerning for infection with significant leukocytosis, anemia with hemoglobin of 6.8.  Patient received PRBC transfusion, IV antibiotics.  He underwent an IR guided placement of JP drain into the rectal abscess.  Discharged home with oral Augmentin. 02/20/2021  presented to ER with new skin opening and draining from left gluteus.  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  JP drain has been removed.  Patient was continued on Augmentin.   02/13/2021, PET scan showed locally advanced rectal cancer with billowing of the mesorectum now with low attenuation material that was not present on previous examination with extensive stranding and inflammation.   Bulky RIGHT pelvic sidewall/hypogastric lymph node outside of the mesorectum with stippled calcification measuring 19 mm-no increased metabolic activity. Small LEFT hypogastric lymph node just peripheral to the internal external bifurcation-SUV 3.2 High RIGHT internal just below or at the internal/common iliac transition, lymph node -SUV 4.8 LEFT high hypogastric lymph node  8 mm-SUV 3. Scattered lymph nodes throughout the retroperitoneum with low FDG uptake Bilateral inguinal lymph nodes largest on the RIGHT (image 248/3) 11 mm with a maximum SUV of 2.8 spiculated nodule in the LEFT upper lobe- 9 x 8 mm       02/14/2021, CT abdomen pelvis without contrast Showed perforated rectal mass with contained perforation with fluid and gas extending above and below the pelvic floor,potentially involving the sphincter complex and extending into LEFT ischial rectal fossa   02/20/2021, CT pelvis with contrast showed Large perirectal/perianal abscess has markedly decreased in size since placement of the percutaneous drain. There are residual gas-filled collections in the soft tissues and suspect there is a fistula or sinus tract between the rectal mass and the subcutaneous tissues. Soft tissue gas along the medial left buttock and concern for a cutaneous ulceration in this area. Large rectal mass with evidence for a large necrotic right pelvic lymph node.   His case is complicated with a perirectal abscess. Prior to onset of perirectal abscess, on his 02/05/2021 scan, he was noted to have right internal iliac lymph node 3 x 1  x 2.3 cm which is concerning for nodal disease.On Subsequent images it was difficult to distinguish whether lymphadenopathy was due to nodal disease versus acute inflammation. 03/07/2021 MRI pelvis cT3 N2. Due to the possible contained perforation on previous CT, possible cT4 disease.    02/27/2021 medi port placed by Dr.Dew 03/13/2021 reports right butt cheek and also left perianal area fullness.he was seen by Dr.Pabon urgently  and had right buttock abscess drained. Left perianal fullness was felt to be due to cancer.    02/08/2021 Cancer Staging   Staging form: Colon and Rectum, AJCC 8th Edition - Clinical stage from 02/08/2021: Stage IIIB (cT4a, cN1, cM0) - Signed by Barry Patience, MD on 03/06/2021 Stage prefix: Initial diagnosis   02/27/2021 Procedure   medi port placed by Dr.Dew   03/18/2021 - 07/05/2021 Chemotherapy    FOLFOX q14d x 4 months      07/17/2021 - 08/27/2021 Chemotherapy   Xeloda concurrent with Radiation   11/18/2021 Surgery   patient is status post open APR with flap for rectal cancer. Pathology rectal adenomacarcinoma ypT3N0.  Positive margin: Radial (circumferential) or mesenteric: adjacent to perforation      01/27/2022 - 02/02/2022 Hospital Admission   Hospitalized at [facility] due to recurrent abscess.  US guided drain into the pelvic abscess with return of 60 ml of purulent fluid. Cultures positive for staph aureus and strep agalactiae group b, fungal cultures negative.  02/25/2022 Imaging   CT chest angiogram with and without contrast, CT abdomen pelvis with and without contrast 1. Negative for acute pulmonary embolus.2. Emphysema. Further decrease in size of the previously noted irregular nodule in the left upper lobe which is now barely measurable today. No new suspicious lung nodules 3. Status post left lower quadrant colostomy. Further decrease in size since comparison exam from May of the rim enhancing gas and fluid collection at the pelvic surgical bed extending from  the perineum superiorly into the pelvis 4. Slightly thickened appearance of terminal ileal small bowel loops in the pelvis with mild stranding suggesting small bowel inflammatory process. 5. Stable enlarged right pelvic sidewall lymph node   06/02/2022 Imaging   CT chest abdomen pelvis w contrast 1. Status post abdominal perineal resection with descending colostomy. 2. At the site of pelvic fluid and gas collection on 02/25/2022, there is residual, decreased presacral soft tissue fullness, without drainable collection. 3. Similar right obturator nodal metastasis.4.  No acute process or evidence of metastatic disease in the chest. 5. Age advanced coronary artery atherosclerosis. Recommend assessment of coronary risk factors. 6. Aortic atherosclerosis and emphysema     06/26/2022 Imaging   CT abdomen pelvis with contrast showed 1. Interval increase in size of a lobulated partially imaged at least 7.8 x 3.8 x 12 cm abscess in the perianal region in a patient status post abdominal perineal resection and left lower lobe end colostomy formation. Finding extends to involve the pre sacral region up to the urinary dome level. Associated posterior urinary bladder wall thickening with lack of intraperitoneal fat plane between the presacral soft tissue thickening/abscess formation suggestive of possible fistulization and invasion of the posterior bladder wall. Underlying recurrent malignancy is not excluded. 2. Stable 2.7 cm right pelvic sidewall lymph node. 3.  Aortic Atherosclerosis   06/26/2022 - 06/27/2022 Hospital Admission   He went to [facility] ER and CT showed recurrent abscess. He was transferred to [facility] due to recurrent abscess, treated with IV vanc/zosyn, IR was consulted and drainage tube was placed. Wound grew up Strep viridans. Patient was seen by ID at [facility] discharged with Augmentin for 2 weeks.   He was seen by wound care Dr> Kandace Blitz on 07/09/2022, JP drain was removed.    09/29/2022 Imaging    MRI pelvis without contrast showed  Interval abdominoperitoneal resection since prior MRI. Decreased small fluid collection in the surgical bed compared with more recent CT, consistent with resolving postoperative fluid collection or abscess.   Bulky rounded presacral mass, which shows diffuse restricted diffusion, without significant change in size since most recent CT of 06/26/2022. This raises suspicion for recurrent carcinoma over post treatment changes. Recommend correlation with CEA level, and consider tissue sampling or PET-CT.   Stable enlarged right pelvic sidewall lymph node. No new or increased adenopathy identified.   10/09/2022 Imaging   CT chest with contrast showed 1. Unchanged 0.4 cm fissural nodule of the anterior left lower lobe. This is almost certainly a benign fissural lymph node. No new or suspicious pulmonary nodules. 2. Moderate emphysema and diffuse bilateral bronchial wall thickening. 3. Coronary artery disease.   10/13/2022 Imaging   CT abdomen pelvis with contrast showed 1. Complex collection persists in the lower pelvis, extending from the anal verge upwards into the presacral space and anteriorly from the presacral space to the bladder dome, not significantly changed in size or extent compared to the earlier CT of 06/26/2022. This is most likely a combination of a postoperative seroma  and/or chronic phlegmon/abscess versus recurrent abscess. 2. Bladder walls are thick walled/edematous. Given the contiguity of the presacral collection and the bladder dome, this is highly suspicious for a related bladder wall infection and possibly secondary to colovesical fistula. Recommend correlation with urinalysis. 3. No evidence of bowel obstruction. LEFT lower abdominal wall colostomy, without obstruction or inflammatory change. 4. No free intraperitoneal air   10/13/2022 [facility] Admission   Hospitalized due to fever and rectal pain. Urine culture showed mixed urogenital  flora. IR CT guided Aspiration of abscess showed Streptococcal Viridans. He was placed back on Augmentin  10/14/2023 CT read by [facility] radiology Postsurgical changes of abdominoperineal resection. Interval enlargement of  fluid collection in the surgical bed. Multiple foci of enhancing tissue at  the margins of this fluid collection, increased from prior study, suspicious for local recurrence.   Prominent retroperitoneal lymph nodes, some which are increased in size from prior study. These are indeterminate and may be either reactive or represent metastatic disease. Recommend continued attention on follow-up.   Centrally hypoattenuating right obturator lymph node, unchanged in size, consistent with treated disease.   Circumferential bladder wall thickening and irregularity, most pronounced on the left side. This may be reactive in the setting of adjacent pelvic inflammatory changes. Correlate with urinalysis if there is clinical  concern for urinary tract infection.    11/14/2022 Imaging   MRI pelvis w wo contrast  1. Status post abdominoperineal resection with left lower quadrant end colostomy. 2. Compared to prior CT and MR, no significant change in appearance of heterogeneous, rim enhancing presacral and low pelvic soft tissue and fluid. Largest heterogeneously enhancing conglomerate of fluid and soft tissue appears to closely involve the anteriorly abutting the seminal vesicles and measures 3.0 x 2.9 cm in largest axial dimension. This extends to superiorly contact the posterior bladder dome and inferiorly towards the gluteal cleft. 3. Discrete fluid component at the most inferior extent measuring 2.4 x 1.3 cm. This is markedly diminished in volume compared to more remote previous examinations, for example 06/26/2022. 4. Constellation of findings is highly concerning for locally recurrent rectal malignancy with or without superimposed infection. Presence or absence of infection at this time is not  established by MR. 5. Additional unchanged hemorrhagic or proteinaceous fluid collection in the right hemipelvis measuring 3.2 x 2.6 cm most consistent with postoperative hematoma or seroma. 6. Unchanged thickening of the urinary bladder wall. As previously reported, fistula or involvement of bladder wall by malignancy not excluded.   12/09/2022 Procedure   CT guided biopsy of the pelvic mass showed Moderately differentiated adenocarcinoma with dirty necrosis, morphologically identical to patient's prior rectal adenocarcinoma.   [facility] NGS xT 648 panel showed MLH3 start loss,  BCORL1 frame shift, TP53 splice region varian, APC stop gain,  TMB 7.9, MS stable, KRAS/BRAF/NRAS negative.   [facility] NGS xR showed no gene arrangement or reportable altered splicing events in RNA sequencing      01/07/2023 -  Chemotherapy   Xeloda 825 mg/m2 twice daily  with radiation   02/11/2023 Imaging   CT abdomen pelvis w contrast  1. Examination is positive for small bowel obstruction. Transition point is identified within the left iliac fossa. Distal to the transition point the small bowel loops exhibit mucosal enhancement and wall thickening concerning for enteritis. 2. There is new right-sided hydronephrosis and hydroureter up to the level of the bifurcation of right common iliac artery. The distal right ureter appears closely associated with the previously characterized tracer avid  presacral soft tissue mass. Cannot exclude obstructive uropathy secondary to tumor involvement. 3. Similar appearance of presacral soft tissue mass. This was tracer avid on the recent PET-CT from 12/17/2022, and concerning for locally recurrent tumor. 4. Unchanged diffuse circumferential wall thickening involving the urinary bladder. 5. Aortic Atherosclerosis    04/17/2023 - 07/29/2023 Chemotherapy   Patient is on Treatment Plan : COLORECTAL FOLFIRI + Panitumumab q14d     08/14/2023 Imaging   CT chest abdomen pelvis w contrast showed   Heterogeneous central pelvic mass in the area of the rectum overall similar configuration to the previous CT scan. Again the lesion abuts the bladder in the area of the seminal vesicles. Extension up along the presacral space. Surgical changes from diverting colostomy.   Essentially stable right pelvic sidewall soft tissue mass or nodal enlargement. No new nodal enlargement elsewhere in the chest, abdomen or pelvis.   Fatty liver infiltration.   Subtle nodular opacity along the right lung base. Infiltrates possible. Please correlate with symptoms and recommend follow-up.   Incidental right lower lobe segmental and lobar pulmonary embolism.     09/23/2023 -  Chemotherapy   Patient is on Treatment Plan : COLORECTAL Xeloda + Panitumumab q21d       Patient presented to emergency room on 12/08/21, rectal pain.  CT scan showed 18.3 cm fluid and gas collection in the pelvic surgical bed compatible with infected collection/abscess.  Patient was sent to [facility] and admitted for.  Patient has CT-guided drain placement on 12/09/2021.  Blood culture positive for Staph epidermidis which was felt to be contaminant.  Fluid culture grew pansensitive Staph aureus, strep pyogenes and candida albicans.  Patient was treated with IV antibiotics and transition to p.o. Augmentin and fluconazole on 12/11/2021.  #Perirectal abscess status post drainage catheter , patient was recently seen by [facility] oncology surgeon on 12/24/2021 and was recommended to keep drainage catheter and continue antibiotics. Patient had a repeat CT scan done which showed a smaller but persistent abscess.  He will return to [facility] surgery for follow-up of drain removal. His case was discussed on [name] tumor board for the positive mesenteric margin which was felt to be secondary to previous perforation.  Recommend surveillance.  02/11/23 patient was admitted due to nausea, CT showed small bowel obstruction, severe constipation, NG tube was placed. His  symptoms improved and has ostomy output, NG was removed and he tolerated PO.   During the interval ER visit due to abdominal pain.  03/02/23 CT abdomen pelvis w contrast showed . Findings are again suggestive of probable partial small bowel obstruction which appears likely related to enteritis. Specifically, there are multiple areas of mural thickening throughout the distal small bowel with increased mucosal and mural enhancement, with some very mild proximal small bowel dilatation. 2. Status post APR with left lower quadrant colostomy and large soft tissue mass in the low anatomic pelvis which is slightly more prominent than the recent prior study, and was previously hypermetabolic on prior PET-CT 12/17/2022, suggesting residual disease. 3. Probable nodal mass along the right pelvic sidewall in the right internal iliac nodal distribution, similar to the prior study, previously not hypermetabolic on prior PET-CT. No other lymphadenopathy or definitive signs of metastatic disease noted elsewhere on today's examination.  4. Aortic atherosclerosis, as well as calcified atherosclerotic plaque in the right coronary artery. Please note that although the presence of coronary artery calcium documents the presence of coronary artery disease, the severity of this disease and any potential stenosis cannot be  assessed on this non-gated CT examination. Assessment for potential risk factor modification, dietary therapy or pharmacologic therapy may be warranted, if clinically indicated. 5. Additional incidental findings, as above. Patient has radiation induced enteritis. His symptoms were mild he was discharged home.   INTERVAL HISTORY Barry [name]. is a 58 y.o. male who has above history reviewed by me today presents for follow up visit for management of recurrent locally advanced rectal cancer. Denies fever chills, rectal pain. Denies rectal discharge.   He has increase colostomy output today.  Denies  nausea vomiting or abdominal pain.        Review of Systems  Constitutional:  Positive for fatigue. Negative for appetite change, chills, fever and unexpected weight change.  HENT:   Negative for hearing loss and voice change.   Eyes:  Negative for eye problems and icterus.  Respiratory:  Negative for chest tightness, cough and shortness of breath.   Cardiovascular:  Negative for chest pain and leg swelling.  Gastrointestinal:  Negative for abdominal distention, abdominal pain, blood in stool, constipation, diarrhea and rectal pain.  Endocrine: Negative for hot flashes.  Genitourinary:  Negative for difficulty urinating, dysuria and frequency.   Musculoskeletal:  Negative for arthralgias.  Skin:  Negative for itching and rash.  Neurological:  Negative for light-headedness and numbness.  Hematological:  Negative for adenopathy. Does not bruise/bleed easily.  Psychiatric/Behavioral:  Negative for confusion.     MEDICAL HISTORY:  Past Medical History:  Diagnosis Date   Headache    Left shoulder pain    Rectal cancer (HCC)     SURGICAL HISTORY: Past Surgical History:  Procedure Laterality Date   COLON SURGERY     COLONOSCOPY WITH PROPOFOL N/A 02/06/2021   Procedure: COLONOSCOPY WITH PROPOFOL;  Surgeon: Toney Reil, MD;  Location: [facility] ENDOSCOPY;  Service: Gastroenterology;  Laterality: N/A;   PORTA CATH INSERTION N/A 02/27/2021   Procedure: PORTA CATH INSERTION;  Surgeon: Annice Needy, MD;  Location: [facility] INVASIVE CV LAB;  Service: Cardiovascular;  Laterality: N/A;   ROTATOR CUFF REPAIR Left     SOCIAL HISTORY: Social History   Socioeconomic History   Marital status: Married    Spouse name: Not on file   Number of children: Not on file   Years of education: Not on file   Highest education level: Not on file  Occupational History   Not on file  Tobacco Use   Smoking status: Former    Current packs/day: 0.00    Average packs/day: 1 pack/day for 10.0 years (10.0  ttl pk-yrs)    Types: Cigars, Cigarettes    Start date: 02/05/2011    Quit date: 02/04/2021    Years since quitting: 2.8   Smokeless tobacco: Never  Substance and Sexual Activity   Alcohol use: Not Currently   Drug use: No   Sexual activity: Not on file  Other Topics Concern   Not on file  Social History Narrative   Not on file   Social Drivers of Health   Financial Resource Strain: High Risk (02/13/2023)   Received from [facility], [facility]   Overall Financial Resource Strain (CARDIA)    Difficulty of Paying Living Expenses: Hard  Food Insecurity: No Food Insecurity (02/13/2023)   Received from [facility], [facility]   Hunger Vital Sign    Worried About Running Out of Food in the Last Year: Never true    Ran Out of Food in  the Last Year: Never true  Transportation Needs: No Transportation Needs (02/13/2023)   Received from [facility], [facility]   [facility] - Transportation    In the past 12 months, has lack of transportation kept you from medical appointments or from getting medications?: No    Lack of Transportation (Non-Medical): No  Physical Activity: Inactive (11/03/2022)   Exercise Vital Sign    Days of Exercise per Week: 0 days    Minutes of Exercise per Session: 0 min  Stress: Stress Concern Present (11/03/2022)   [employer] of Occupational Health - Occupational Stress Questionnaire    Feeling of Stress : Rather much  Social Connections: Moderately Isolated (11/03/2022)   Social Connection and Isolation Panel [NHANES]    Frequency of Communication with Friends and Family: Twice a week    Frequency of Social Gatherings with Friends and Family: Three times a week    Attends Religious Services: 1 to 4 times per year    Active Member of Clubs or Organizations: No    Attends Banker Meetings: Never    Marital Status: Separated  Intimate Partner  Violence: Not At Risk (11/03/2022)   Humiliation, Afraid, Rape, and Kick questionnaire    Fear of Current or Ex-Partner: No    Emotionally Abused: No    Physically Abused: No    Sexually Abused: No    FAMILY HISTORY: Family History  Problem Relation Age of Onset   Cancer Sister    Diabetes Mother    Cancer Maternal Grandmother    Cancer Paternal Grandmother     ALLERGIES:  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  MEDICATIONS:  Current Outpatient Medications  Medication Sig Dispense Refill   calcium-vitamin D (OSCAL WITH D) 500-5 MG-MCG tablet Take 2 tablets by mouth daily. 60 tablet 3   capecitabine (XELODA) 150 MG tablet TAKE 1 TABLET BY MOUTH 2 TIMES A DAY AFTER A MEAL FOR 14 DAYS ON, THEN 7 DAYS OFF, REPEAT EVERY 21 DAYS. (TOTAL DOSE OF 1,650 MG 2 TIMES A DAY) 28 tablet 1   capecitabine (XELODA) 500 MG tablet TAKE 3 TABLETS BY MOUTH 2 TIMES A DAY AFTER A MEAL FOR 14 DAYS ON, THEN 7 DAYS OFF, REPEAT EVERY 21 DAYS. (TOTAL DOSE OF 1,650 MG 2 TIMES A DAY) 84 tablet 1   citalopram (CELEXA) 10 MG tablet Take 1 tablet (10 mg total) by mouth daily. 30 tablet 3   clindamycin (CLINDAGEL) 1 % gel Apply topically 2 (two) times daily. 30 g 4   lidocaine-prilocaine (EMLA) cream Apply 1 Application topically as needed. Apply small amount to port and cover with saran wrap 1-2 hours prior to port access 30 g 2   loperamide (IMODIUM) 2 MG capsule Take 1 capsule (2 mg total) by mouth See admin instructions. 90 capsule 2   magnesium chloride (SLOW-MAG) 64 MG TBEC SR tablet Take 1 tablet (64 mg total) by mouth daily. 30 tablet 1   ondansetron (ZOFRAN) 8 MG tablet Take 1 tablet (8 mg total) by mouth every 8 (eight) hours as needed for nausea or vomiting. 90 tablet 1   potassium chloride SA (KLOR-CON M) 20 MEQ tablet Take 2 tablets (40 mEq total) by mouth in the morning and take 1 tablet ( ) in the evening. 60 tablet 2   prochlorperazine (COMPAZINE) 10 MG tablet Take 1 tablet (10 mg total) by mouth every 6  (six) hours as needed for nausea or vomiting. 90 tablet 1   rivaroxaban (XARELTO)  20 MG TABS tablet Take 1 tablet (20 mg total) by mouth daily with supper. 30 tablet 4   senna (SENOKOT) 8.6 MG TABS tablet Take 2 tablets (17.2 mg total) by mouth daily. 100 tablet 0   traZODone (DESYREL) 50 MG tablet Take 1 tablet (50 mg total) by mouth at bedtime as needed for sleep. 30 tablet 3   No current facility-administered medications for this visit.   Facility-Administered Medications Ordered in Other Visits  Medication Dose Route Frequency Provider Last Rate Last Admin   0.9 %  sodium chloride infusion   Intravenous Continuous Barry Patience, MD   Stopped at 11/25/23 1130   heparin lock flush 100 unit/mL  500 Units Intravenous Once Barry Patience, MD         PHYSICAL EXAMINATION: ECOG PERFORMANCE STATUS: 1 - Symptomatic but completely ambulatory Vitals:   11/25/23 0856 11/25/23 0904  BP: (!) 132/105 (!) 133/102  Pulse:  94  Resp:    Temp:    SpO2:       Filed Weights   11/25/23 0854  Weight: 182 lb 1.6 oz (82.6 kg)       Physical Exam HENT:     Head: Normocephalic and atraumatic.  Eyes:     General: No scleral icterus. Cardiovascular:     Rate and Rhythm: Normal rate and regular rhythm.     Heart sounds: Normal heart sounds.  Pulmonary:     Effort: Pulmonary effort is normal. No respiratory distress.     Breath sounds: No wheezing.     Comments: Decreased breath sound bilaterally.  Abdominal:     General: Bowel sounds are normal. There is no distension.     Palpations: Abdomen is soft.     Comments: Colostomy bag  Genitourinary:    Comments: History of APR, Musculoskeletal:        General: No deformity. Normal range of motion.     Cervical back: Normal range of motion and neck supple.  Skin:    General: Skin is warm and dry.     Findings: No erythema.  Neurological:     Mental Status: He is alert and oriented to person, place, and time. Mental status is at baseline.     Cranial  Nerves: No cranial nerve deficit.  Psychiatric:        Mood and Affect: Mood normal.     LABORATORY DATA:  I have reviewed the data as listed    Latest Ref Rng & Units 11/25/2023    8:38 AM 11/04/2023    8:24 AM 10/02/2023    9:36 AM  CBC  WBC 4.0 - 10.5 K/uL 4.0  4.8  4.3   Hemoglobin 13.0 - 17.0 g/dL 30.8  65.7  84.6   Hematocrit 39.0 - 52.0 % 47.0  44.3  43.4   Platelets 150 - 400 K/uL 192  211  180       Latest Ref Rng & Units 11/25/2023    8:38 AM 11/04/2023    8:24 AM 10/02/2023    9:36 AM  CMP  Glucose 70 - 99 mg/dL 962  952  841   BUN 6 - 20 mg/dL 12  14  9    Creatinine 0.61 - 1.24 mg/dL 3.24  4.01  0.27   Sodium 135 - 145 mmol/L 136  139  136   Potassium 3.5 - 5.1 mmol/L 3.2  4.0  3.0   Chloride 98 - 111 mmol/L 105  110  101   CO2 22 -  32 mmol/L 26  21  27    Calcium 8.9 - 10.3 mg/dL 8.1  8.5  8.9   Total Protein 6.5 - 8.1 g/dL 7.2  7.4  7.4   Total Bilirubin 0.0 - 1.2 mg/dL 0.6  0.5  0.8   Alkaline Phos 38 - 126 U/L 85  98  76   AST 15 - 41 U/L 20  17  19    ALT 0 - 44 U/L 18  15  17       RADIOGRAPHIC STUDIES: I have personally reviewed the radiological images as listed and agreed with the findings in the report. No results found.

## 2023-11-25 NOTE — Assessment & Plan Note (Signed)
 Continue Xarelto 20mg  daily.

## 2023-11-25 NOTE — Assessment & Plan Note (Signed)
 Recommend patient to continue slow Mag 1 tab daily.

## 2023-11-25 NOTE — Assessment & Plan Note (Signed)
 Chemotherapy plan as listed

## 2023-11-25 NOTE — Assessment & Plan Note (Signed)
 His diastolic BP is chronically elevated.  Previously discussed about starting BP medication and he declines.  monitor

## 2023-11-25 NOTE — Assessment & Plan Note (Addendum)
 locally advanced rectal cancer-Stage IIIB, s/p TNT neoadjuvant protocol, concurrent Xeloda and radiation-Finished 08/27/2021, s/p  APR,rectal adenocarcinoma, [id number]. Positive radial margin due to perforation. Biopsy proven recurrent rectal cancer, locally advanced disease, possible colovesical fistula; no distant metastatic disease on PET- per [name] Surgeon Dr. Luciano Cutter, recurrence is not resectable. --> June 2024 S/p concurrent Xeloda 825 mg/m2 twice daily  with radiation --> July 2024 MRI pelvis w wo contrast showed persistent disease. Disease is not resectable per colorectal surgeon --> FOLFIRI Panitumumab --> Dec 2025 partial response. --> per pt's preference switched to Xeloda + Panitumumab.  Labs are reviewed and discussed with patient.  Proceed with cycle 3 Xeloda 850 mg/m2 twice daily on days 1 to 14 of a 21-day cycle plus Panitumumab Q3 weeks. [Panel study regimen].  He tolerates regimen well.

## 2023-11-25 NOTE — Assessment & Plan Note (Signed)
 Recommend imodium PRN as directed.

## 2023-11-25 NOTE — Assessment & Plan Note (Signed)
 Continue  potassium to in AM and in PM.

## 2023-11-25 NOTE — Assessment & Plan Note (Signed)
 Continue calcium 1000mg  daily. He will get IV calcium gluconate 2g x 1.  Check Vitamin D level

## 2023-12-07 ENCOUNTER — Encounter: Payer: Self-pay | Admitting: Oncology

## 2023-12-07 ENCOUNTER — Telehealth: Payer: Self-pay | Admitting: *Deleted

## 2023-12-07 NOTE — Telephone Encounter (Signed)
 He spoke to Baraga and she will send him a letter when it is done. . Pt. Aware of this

## 2023-12-07 NOTE — Telephone Encounter (Signed)
 Letter of exemption of court ordered community service has been completed and faxed to Lakeland Kussin @ [phone].   Letter available in media

## 2023-12-09 ENCOUNTER — Ambulatory Visit: Admission: RE | Admit: 2023-12-09 | Source: Ambulatory Visit

## 2023-12-10 ENCOUNTER — Ambulatory Visit
Admission: RE | Admit: 2023-12-10 | Discharge: 2023-12-10 | Disposition: A | Discharge status: Home / Self Care | Source: Ambulatory Visit | Attending: Oncology | Admitting: Oncology

## 2023-12-10 DIAGNOSIS — C20 Malignant neoplasm of rectum: Secondary | ICD-10-CM | POA: Diagnosis present

## 2023-12-10 MED ORDER — IOHEXOL 300 MG/ML  SOLN
100.0000 mL | Freq: Once | INTRAMUSCULAR | Status: AC | PRN
  Administered 2023-12-10: 100 mL via INTRAVENOUS

## 2023-12-10 MED ORDER — HEPARIN SOD (PORK) LOCK FLUSH 100 UNIT/ML IV SOLN
500.0000 [IU] | Freq: Once | INTRAVENOUS | Status: DC
  Filled 2023-12-10: qty 5

## 2023-12-16 ENCOUNTER — Inpatient Hospital Stay: Payer: Medicare Other | Attending: Oncology

## 2023-12-16 ENCOUNTER — Encounter: Payer: Self-pay | Admitting: Oncology

## 2023-12-16 ENCOUNTER — Inpatient Hospital Stay: Payer: Medicare Other

## 2023-12-16 ENCOUNTER — Other Ambulatory Visit (HOSPITAL_COMMUNITY): Payer: Self-pay

## 2023-12-16 ENCOUNTER — Other Ambulatory Visit: Payer: Self-pay

## 2023-12-16 ENCOUNTER — Inpatient Hospital Stay (HOSPITAL_BASED_OUTPATIENT_CLINIC_OR_DEPARTMENT_OTHER): Payer: Medicare Other | Admitting: Oncology

## 2023-12-16 VITALS — BP 132/100 | HR 87 | Temp 96.1°F | Resp 18 | Ht 68.0 in | Wt 180.0 lb

## 2023-12-16 DIAGNOSIS — I2699 Other pulmonary embolism without acute cor pulmonale: Secondary | ICD-10-CM | POA: Insufficient documentation

## 2023-12-16 DIAGNOSIS — E876 Hypokalemia: Secondary | ICD-10-CM

## 2023-12-16 DIAGNOSIS — C20 Malignant neoplasm of rectum: Secondary | ICD-10-CM | POA: Insufficient documentation

## 2023-12-16 DIAGNOSIS — I10 Essential (primary) hypertension: Secondary | ICD-10-CM | POA: Insufficient documentation

## 2023-12-16 DIAGNOSIS — Z7901 Long term (current) use of anticoagulants: Secondary | ICD-10-CM | POA: Insufficient documentation

## 2023-12-16 DIAGNOSIS — Z5111 Encounter for antineoplastic chemotherapy: Secondary | ICD-10-CM

## 2023-12-16 DIAGNOSIS — Z5112 Encounter for antineoplastic immunotherapy: Secondary | ICD-10-CM | POA: Diagnosis present

## 2023-12-16 LAB — MAGNESIUM: Magnesium: 1.9 mg/dL (ref 1.7–2.4)

## 2023-12-16 LAB — CBC WITH DIFFERENTIAL (CANCER CENTER ONLY)
Abs Immature Granulocytes: 0.03 10*3/uL (ref 0.00–0.07)
Basophils Absolute: 0 10*3/uL (ref 0.0–0.1)
Basophils Relative: 1 %
Eosinophils Absolute: 0.1 10*3/uL (ref 0.0–0.5)
Eosinophils Relative: 3 %
HCT: 46.6 % (ref 39.0–52.0)
Hemoglobin: 16.5 g/dL (ref 13.0–17.0)
Immature Granulocytes: 1 %
Lymphocytes Relative: 30 %
Lymphs Abs: 1.2 10*3/uL (ref 0.7–4.0)
MCH: 33.1 pg (ref 26.0–34.0)
MCHC: 35.4 g/dL (ref 30.0–36.0)
MCV: 93.4 fL (ref 80.0–100.0)
Monocytes Absolute: 0.6 10*3/uL (ref 0.1–1.0)
Monocytes Relative: 16 %
Neutro Abs: 2 10*3/uL (ref 1.7–7.7)
Neutrophils Relative %: 49 %
Platelet Count: 218 10*3/uL (ref 150–400)
RBC: 4.99 MIL/uL (ref 4.22–5.81)
RDW: 14.2 % (ref 11.5–15.5)
WBC Count: 3.9 10*3/uL — ABNORMAL LOW (ref 4.0–10.5)
nRBC: 0 % (ref 0.0–0.2)

## 2023-12-16 LAB — CMP (CANCER CENTER ONLY)
ALT: 19 U/L (ref 0–44)
AST: 20 U/L (ref 15–41)
Albumin: 3.5 g/dL (ref 3.5–5.0)
Alkaline Phosphatase: 88 U/L (ref 38–126)
Anion gap: 9 (ref 5–15)
BUN: 9 mg/dL (ref 6–20)
CO2: 25 mmol/L (ref 22–32)
Calcium: 8.8 mg/dL — ABNORMAL LOW (ref 8.9–10.3)
Chloride: 104 mmol/L (ref 98–111)
Creatinine: 0.89 mg/dL (ref 0.61–1.24)
GFR, Estimated: >60 mL/min (ref >60)
Glucose, Bld: 117 mg/dL — ABNORMAL HIGH (ref 70–99)
Potassium: 3.9 mmol/L (ref 3.5–5.1)
Sodium: 138 mmol/L (ref 135–145)
Total Bilirubin: 0.7 mg/dL (ref 0.0–1.2)
Total Protein: 7.1 g/dL (ref 6.5–8.1)

## 2023-12-16 MED ORDER — SODIUM CHLORIDE 0.9 % IV SOLN
9.0000 mg/kg | Freq: Once | INTRAVENOUS | Status: AC
  Administered 2023-12-16: 700 mg via INTRAVENOUS
  Filled 2023-12-16: qty 15

## 2023-12-16 MED ORDER — HEPARIN SOD (PORK) LOCK FLUSH 100 UNIT/ML IV SOLN
500.0000 [IU] | Freq: Once | INTRAVENOUS | Status: AC | PRN
  Administered 2023-12-16: 500 [IU]
  Filled 2023-12-16: qty 5

## 2023-12-16 MED ORDER — SODIUM CHLORIDE 0.9 % IV SOLN
INTRAVENOUS | Status: DC
  Filled 2023-12-16: qty 250

## 2023-12-16 MED ORDER — LOSARTAN POTASSIUM 25 MG PO TABS
25.0000 mg | ORAL_TABLET | Freq: Every day | ORAL | 0 refills | Status: DC
  Filled 2023-12-16 (×2): qty 30, 30d supply, fill #0

## 2023-12-16 NOTE — Assessment & Plan Note (Addendum)
 His diastolic BP is chronically elevated.  I recommend losartan 25mg  daily. Rationale and side effects were reviewed with patient.  He agrees with the plan.  Recommend patient to monitor BP at home.  I have provided list of local PCP provider office information and urged him to establish care with PCP

## 2023-12-16 NOTE — Assessment & Plan Note (Signed)
 Chemotherapy plan as listed

## 2023-12-16 NOTE — Assessment & Plan Note (Signed)
 Continue calcium 1000mg  daily.  Check Vitamin D level

## 2023-12-16 NOTE — Progress Notes (Signed)
 Hematology/Oncology Progress note [phone] [phone]      CHIEF COMPLAINTS/REASON FOR VISIT:  Follow up for recurrent rectal cancer treatment  ASSESSMENT & PLAN:   Cancer Staging  Rectal cancer [facility]) Staging form: Colon and Rectum, AJCC 8th Edition - Clinical stage from 02/08/2021: Stage IIIB (cT4a, cN1, cM0) - Signed by [name], MD on 03/06/2021   Rectal cancer [facility]) locally advanced rectal cancer-Stage IIIB, s/p TNT neoadjuvant protocol, concurrent Xeloda and radiation-Finished 08/27/2021, s/p  APR,rectal adenocarcinoma, ypT3N0. Positive radial margin due to perforation. Biopsy proven recurrent rectal cancer, locally advanced disease, possible colovesical fistula; no distant metastatic disease on PET- per [name] Surgeon Dr. Luciano Cutter, recurrence is not resectable. --> June 2024 S/p concurrent Xeloda 825 mg/m2 twice daily  with radiation --> July 2024 MRI pelvis w wo contrast showed persistent disease. Disease is not resectable per colorectal surgeon --> FOLFIRI Panitumumab --> Dec 2025 partial response. --> per pt's preference switched to Xeloda + Panitumumab.  Labs are reviewed and discussed with patient.  Proceed with cycle 4 Xeloda 850 mg/m2 twice daily on days 1 to 14 of a 21-day cycle plus Panitumumab Q3 weeks. [Panel study regimen].  He tolerates regimen well.  CT result is back after his encounter. - stable disease- mild treatment response.   Hypertension His diastolic BP is chronically elevated.  I recommend losartan 25mg  daily. Rationale and side effects were reviewed with patient.  He agrees with the plan.  Recommend patient to monitor BP at home.  I have provided list of local PCP provider office information and urged him to establish care with PCP  Acute pulmonary embolism (HCC) Continue  Xarelto 20mg  daily.  Encounter for antineoplastic chemotherapy Chemotherapy plan as listed  Hypocalcemia Continue calcium 1000mg  daily.  Check Vitamin D  level  Hypokalemia Continue  potassium to in AM and in PM.    Hypomagnesemia Recommend patient to continue slow Mag 1 tab daily.       Orders Placed This Encounter  Procedures   Magnesium    Standing Status:   Future    Expected Date:   01/06/2024    Expiration Date:   01/05/2025   CEA    Standing Status:   Future    Expected Date:   01/06/2024    Expiration Date:   01/05/2025   CBC with Differential (Cancer Center Only)    Standing Status:   Future    Expected Date:   01/06/2024    Expiration Date:   01/05/2025   CMP (Cancer Center only)    Standing Status:   Future    Expected Date:   01/06/2024    Expiration Date:   01/05/2025    Follow-up  3 weeks All questions were answered. The patient knows to call the clinic with any problems, questions or concerns.  [name], MD, PhD [facility] Hematology Oncology 12/16/2023     HISTORY OF PRESENTING ILLNESS:   Barry [name]. is a  58 y.o.  male presents for rectal cancer Oncology History  Rectal cancer (HCC)  02/08/2021 Initial Diagnosis   Rectal cancer   02/05/2021-02/06/2021 patient was hospitalized due to generalized weakness, intermittent lightheadedness, weight loss and worsening constipation.  02/05/2021 CT abdomen showed concerning of severe rectal wall thickening and right internal iliac lymph node concerning for metastatic disease.  Patient was seen by gastroenterology and had colonoscopy which showed a circumferential fungating mass in the rectum.  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   02/14/2021-02/16/2021  hospitalized due to rectal bleeding and pain.   02/14/2021 CT showed perirectal fluid collection/gas concerning for infection with significant leukocytosis, anemia with hemoglobin of 6.8.  Patient received PRBC transfusion, IV antibiotics.  He underwent an IR guided placement of JP drain into the rectal abscess.  Discharged home with oral Augmentin. 02/20/2021 presented to ER  with new skin opening and draining from left gluteus.  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  JP drain has been removed.  Patient was continued on Augmentin.   02/13/2021, PET scan showed locally advanced rectal cancer with billowing of the mesorectum now with low attenuation material that was not present on previous examination with extensive stranding and inflammation.   Bulky RIGHT pelvic sidewall/hypogastric lymph node outside of the mesorectum with stippled calcification measuring 19 mm-no increased metabolic activity. Small LEFT hypogastric lymph node just peripheral to the internal external bifurcation-SUV 3.2 High RIGHT internal just below or at the internal/common iliac transition, lymph node -SUV 4.8 LEFT high hypogastric lymph node  8 mm-SUV 3. Scattered lymph nodes throughout the retroperitoneum with low FDG uptake Bilateral inguinal lymph nodes largest on the RIGHT (image 248/3) 11 mm with a maximum SUV of 2.8 spiculated nodule in the LEFT upper lobe- 9 x 8 mm       02/14/2021, CT abdomen pelvis without contrast Showed perforated rectal mass with contained perforation with fluid and gas extending above and below the pelvic floor,potentially involving the sphincter complex and extending into LEFT ischial rectal fossa   02/20/2021, CT pelvis with contrast showed Large perirectal/perianal abscess has markedly decreased in size since placement of the percutaneous drain. There are residual gas-filled collections in the soft tissues and suspect there is a fistula or sinus tract between the rectal mass and the subcutaneous tissues. Soft tissue gas along the medial left buttock and concern for a cutaneous ulceration in this area. Large rectal mass with evidence for a large necrotic right pelvic lymph node.   His case is complicated with a perirectal abscess. Prior to onset of perirectal abscess, on his 02/05/2021 scan, he was noted to have right internal iliac lymph node 3 x 1 x 2.3 cm which  is concerning for nodal disease.On Subsequent images it was difficult to distinguish whether lymphadenopathy was due to nodal disease versus acute inflammation. 03/07/2021 MRI pelvis cT3 N2. Due to the possible contained perforation on previous CT, possible cT4 disease.    02/27/2021 medi port placed by Dr.Dew 03/13/2021 reports right butt cheek and also left perianal area fullness.he was seen by Dr.Pabon urgently  and had right buttock abscess drained. Left perianal fullness was felt to be due to cancer.    02/08/2021 Cancer Staging   Staging form: Colon and Rectum, AJCC 8th Edition - Clinical stage from 02/08/2021: Stage IIIB (cT4a, cN1, cM0) - Signed by [name], MD on 03/06/2021 Stage prefix: Initial diagnosis   02/27/2021 Procedure   medi port placed by Dr.Dew   03/18/2021 - 07/05/2021 Chemotherapy    FOLFOX q14d x 4 months      07/17/2021 - 08/27/2021 Chemotherapy   Xeloda concurrent with Radiation   11/18/2021 Surgery   patient is status post open APR with flap for rectal cancer. Pathology rectal adenomacarcinoma ypT3N0.  Positive margin: Radial (circumferential) or mesenteric: adjacent to perforation      01/27/2022 - 02/02/2022 Hospital Admission   Hospitalized at [facility] due to recurrent abscess.  US guided drain into the pelvic abscess with return of 60 ml of purulent fluid. Cultures positive  for staph aureus and strep agalactiae group b, fungal cultures negative.    02/25/2022 Imaging   CT chest angiogram with and without contrast, CT abdomen pelvis with and without contrast 1. Negative for acute pulmonary embolus.2. Emphysema. Further decrease in size of the previously noted irregular nodule in the left upper lobe which is now barely measurable today. No new suspicious lung nodules 3. Status post left lower quadrant colostomy. Further decrease in size since comparison exam from May of the rim enhancing gas and fluid collection at the pelvic surgical bed extending from the perineum  superiorly into the pelvis 4. Slightly thickened appearance of terminal ileal small bowel loops in the pelvis with mild stranding suggesting small bowel inflammatory process. 5. Stable enlarged right pelvic sidewall lymph node   06/02/2022 Imaging   CT chest abdomen pelvis w contrast 1. Status post abdominal perineal resection with descending colostomy. 2. At the site of pelvic fluid and gas collection on 02/25/2022, there is residual, decreased presacral soft tissue fullness, without drainable collection. 3. Similar right obturator nodal metastasis.4.  No acute process or evidence of metastatic disease in the chest. 5. Age advanced coronary artery atherosclerosis. Recommend assessment of coronary risk factors. 6. Aortic atherosclerosis and emphysema     06/26/2022 Imaging   CT abdomen pelvis with contrast showed 1. Interval increase in size of a lobulated partially imaged at least 7.8 x 3.8 x 12 cm abscess in the perianal region in a patient status post abdominal perineal resection and left lower lobe end colostomy formation. Finding extends to involve the pre sacral region up to the urinary dome level. Associated posterior urinary bladder wall thickening with lack of intraperitoneal fat plane between the presacral soft tissue thickening/abscess formation suggestive of possible fistulization and invasion of the posterior bladder wall. Underlying recurrent malignancy is not excluded. 2. Stable 2.7 cm right pelvic sidewall lymph node. 3.  Aortic Atherosclerosis   06/26/2022 - 06/27/2022 Hospital Admission   He went to [facility] ER and CT showed recurrent abscess. He was transferred to [facility] due to recurrent abscess, treated with IV vanc/zosyn, IR was consulted and drainage tube was placed. Wound grew up Strep viridans. Patient was seen by ID at [facility] discharged with Augmentin for 2 weeks.   He was seen by wound care Dr> Kandace Blitz on 07/09/2022, JP drain was removed.    09/29/2022 Imaging   MRI pelvis  without contrast showed  Interval abdominoperitoneal resection since prior MRI. Decreased small fluid collection in the surgical bed compared with more recent CT, consistent with resolving postoperative fluid collection or abscess.   Bulky rounded presacral mass, which shows diffuse restricted diffusion, without significant change in size since most recent CT of 06/26/2022. This raises suspicion for recurrent carcinoma over post treatment changes. Recommend correlation with CEA level, and consider tissue sampling or PET-CT.   Stable enlarged right pelvic sidewall lymph node. No new or increased adenopathy identified.   10/09/2022 Imaging   CT chest with contrast showed 1. Unchanged 0.4 cm fissural nodule of the anterior left lower lobe. This is almost certainly a benign fissural lymph node. No new or suspicious pulmonary nodules. 2. Moderate emphysema and diffuse bilateral bronchial wall thickening. 3. Coronary artery disease.   10/13/2022 Imaging   CT abdomen pelvis with contrast showed 1. Complex collection persists in the lower pelvis, extending from the anal verge upwards into the presacral space and anteriorly from the presacral space to the bladder dome, not significantly changed in size or extent compared to the  earlier CT of 06/26/2022. This is most likely a combination of a postoperative seroma and/or chronic phlegmon/abscess versus recurrent abscess. 2. Bladder walls are thick walled/edematous. Given the contiguity of the presacral collection and the bladder dome, this is highly suspicious for a related bladder wall infection and possibly secondary to colovesical fistula. Recommend correlation with urinalysis. 3. No evidence of bowel obstruction. LEFT lower abdominal wall colostomy, without obstruction or inflammatory change. 4. No free intraperitoneal air   10/13/2022 [facility] Admission   Hospitalized due to fever and rectal pain. Urine culture showed mixed urogenital flora. IR CT  guided Aspiration of abscess showed Streptococcal Viridans. He was placed back on Augmentin  10/14/2023 CT read by [facility] radiology Postsurgical changes of abdominoperineal resection. Interval enlargement of  fluid collection in the surgical bed. Multiple foci of enhancing tissue at  the margins of this fluid collection, increased from prior study, suspicious for local recurrence.   Prominent retroperitoneal lymph nodes, some which are increased in size from prior study. These are indeterminate and may be either reactive or represent metastatic disease. Recommend continued attention on follow-up.   Centrally hypoattenuating right obturator lymph node, unchanged in size, consistent with treated disease.   Circumferential bladder wall thickening and irregularity, most pronounced on the left side. This may be reactive in the setting of adjacent pelvic inflammatory changes. Correlate with urinalysis if there is clinical  concern for urinary tract infection.    11/14/2022 Imaging   MRI pelvis w wo contrast  1. Status post abdominoperineal resection with left lower quadrant end colostomy. 2. Compared to prior CT and MR, no significant change in appearance of heterogeneous, rim enhancing presacral and low pelvic soft tissue and fluid. Largest heterogeneously enhancing conglomerate of fluid and soft tissue appears to closely involve the anteriorly abutting the seminal vesicles and measures 3.0 x 2.9 cm in largest axial dimension. This extends to superiorly contact the posterior bladder dome and inferiorly towards the gluteal cleft. 3. Discrete fluid component at the most inferior extent measuring 2.4 x 1.3 cm. This is markedly diminished in volume compared to more remote previous examinations, for example 06/26/2022. 4. Constellation of findings is highly concerning for locally recurrent rectal malignancy with or without superimposed infection. Presence or absence of infection at this time is not established  by MR. 5. Additional unchanged hemorrhagic or proteinaceous fluid collection in the right hemipelvis measuring 3.2 x 2.6 cm most consistent with postoperative hematoma or seroma. 6. Unchanged thickening of the urinary bladder wall. As previously reported, fistula or involvement of bladder wall by malignancy not excluded.   12/09/2022 Procedure   CT guided biopsy of the pelvic mass showed Moderately differentiated adenocarcinoma with dirty necrosis, morphologically identical to patient's prior rectal adenocarcinoma.   [facility] NGS xT 648 panel showed MLH3 start loss,  BCORL1 frame shift, TP53 splice region varian, APC stop gain,  TMB 7.9, MS stable, KRAS/BRAF/NRAS negative.   [facility] NGS xR showed no gene arrangement or reportable altered splicing events in RNA sequencing      01/07/2023 -  Chemotherapy   Xeloda 825 mg/m2 twice daily  with radiation   02/11/2023 Imaging   CT abdomen pelvis w contrast  1. Examination is positive for small bowel obstruction. Transition point is identified within the left iliac fossa. Distal to the transition point the small bowel loops exhibit mucosal enhancement and wall thickening concerning for enteritis. 2. There is new right-sided hydronephrosis and hydroureter up to the level of the bifurcation of right common iliac  artery. The distal right ureter appears closely associated with the previously characterized tracer avid presacral soft tissue mass. Cannot exclude obstructive uropathy secondary to tumor involvement. 3. Similar appearance of presacral soft tissue mass. This was tracer avid on the recent PET-CT from 12/17/2022, and concerning for locally recurrent tumor. 4. Unchanged diffuse circumferential wall thickening involving the urinary bladder. 5. Aortic Atherosclerosis    04/17/2023 - 07/29/2023 Chemotherapy   Patient is on Treatment Plan : COLORECTAL FOLFIRI + Panitumumab q14d     08/14/2023 Imaging   CT chest abdomen pelvis w contrast showed   Heterogeneous central pelvic mass in the area of the rectum overall similar configuration to the previous CT scan. Again the lesion abuts the bladder in the area of the seminal vesicles. Extension up along the presacral space. Surgical changes from diverting colostomy.   Essentially stable right pelvic sidewall soft tissue mass or nodal enlargement. No new nodal enlargement elsewhere in the chest, abdomen or pelvis.   Fatty liver infiltration.   Subtle nodular opacity along the right lung base. Infiltrates possible. Please correlate with symptoms and recommend follow-up.   Incidental right lower lobe segmental and lobar pulmonary embolism.     09/23/2023 -  Chemotherapy   Patient is on Treatment Plan : COLORECTAL Xeloda + Panitumumab q21d     12/10/2023 Imaging   CT chest abdomen pelvis w contrast showed 1. Similar appearance of soft tissue mass within the posterior pelvis in the region of the rectum. This is stable to mildly decreased in the interval. As noted previously tumor extends along the presacral soft tissue space and abuts the posterior dome of bladder and posterior bladder base. 2. Stable appearance of soft tissue mass/enlarged lymph node within the right posterior pelvic sidewall. 3. No new or progressive disease identified. 4.  Aortic Atherosclerosis (ICD10-I70.0).      Patient presented to emergency room on 12/08/21, rectal pain.  CT scan showed 18.3 cm fluid and gas collection in the pelvic surgical bed compatible with infected collection/abscess.  Patient was sent to [facility] and admitted for.  Patient has CT-guided drain placement on 12/09/2021.  Blood culture positive for Staph epidermidis which was felt to be contaminant.  Fluid culture grew pansensitive Staph aureus, strep pyogenes and candida albicans.  Patient was treated with IV antibiotics and transition to p.o. Augmentin and fluconazole on 12/11/2021.  #Perirectal abscess status post drainage catheter , patient was  recently seen by [facility] oncology surgeon on 12/24/2021 and was recommended to keep drainage catheter and continue antibiotics. Patient had a repeat CT scan done which showed a smaller but persistent abscess.  He will return to [facility] surgery for follow-up of drain removal. His case was discussed on [name] tumor board for the positive mesenteric margin which was felt to be secondary to previous perforation.  Recommend surveillance.  02/11/23 patient was admitted due to nausea, CT showed small bowel obstruction, severe constipation, NG tube was placed. His symptoms improved and has ostomy output, NG was removed and he tolerated PO.   During the interval ER visit due to abdominal pain.  03/02/23 CT abdomen pelvis w contrast showed . Findings are again suggestive of probable partial small bowel obstruction which appears likely related to enteritis. Specifically, there are multiple areas of mural thickening throughout the distal small bowel with increased mucosal and mural enhancement, with some very mild proximal small bowel dilatation. 2. Status post APR with left lower quadrant colostomy and large soft tissue mass in the low anatomic pelvis which is  slightly more prominent than the recent prior study, and was previously hypermetabolic on prior PET-CT 12/17/2022, suggesting residual disease. 3. Probable nodal mass along the right pelvic sidewall in the right internal iliac nodal distribution, similar to the prior study, previously not hypermetabolic on prior PET-CT. No other lymphadenopathy or definitive signs of metastatic disease noted elsewhere on today's examination.  4. Aortic atherosclerosis, as well as calcified atherosclerotic plaque in the right coronary artery. Please note that although the presence of coronary artery calcium documents the presence of coronary artery disease, the severity of this disease and any potential stenosis cannot be assessed on this non-gated CT examination. Assessment for  potential risk factor modification, dietary therapy or pharmacologic therapy may be warranted, if clinically indicated. 5. Additional incidental findings, as above. Patient has radiation induced enteritis. His symptoms were mild he was discharged home.   INTERVAL HISTORY Barry [name]. is a 58 y.o. male who has above history reviewed by me today presents for follow up visit for management of recurrent locally advanced rectal cancer. Denies fever chills, rectal pain. Denies rectal discharge.   He has increase colostomy output before his appointment. He attributes that to stress.   No diarrhea after each treatments.   Denies nausea vomiting or abdominal pain.        Review of Systems  Constitutional:  Positive for fatigue. Negative for appetite change, chills, fever and unexpected weight change.  HENT:   Negative for hearing loss and voice change.   Eyes:  Negative for eye problems and icterus.  Respiratory:  Negative for chest tightness, cough and shortness of breath.   Cardiovascular:  Negative for chest pain and leg swelling.  Gastrointestinal:  Negative for abdominal distention, abdominal pain, blood in stool, constipation, diarrhea and rectal pain.  Endocrine: Negative for hot flashes.  Genitourinary:  Negative for difficulty urinating, dysuria and frequency.   Musculoskeletal:  Negative for arthralgias.  Skin:  Negative for itching and rash.  Neurological:  Negative for light-headedness and numbness.  Hematological:  Negative for adenopathy. Does not bruise/bleed easily.  Psychiatric/Behavioral:  Negative for confusion.     MEDICAL HISTORY:  Past Medical History:  Diagnosis Date   Headache    Left shoulder pain    Rectal cancer (HCC)     SURGICAL HISTORY: Past Surgical History:  Procedure Laterality Date   COLON SURGERY     COLONOSCOPY WITH PROPOFOL N/A 02/06/2021   Procedure: COLONOSCOPY WITH PROPOFOL;  Surgeon: Toney Reil, MD;  Location: [facility] ENDOSCOPY;   Service: Gastroenterology;  Laterality: N/A;   PORTA CATH INSERTION N/A 02/27/2021   Procedure: PORTA CATH INSERTION;  Surgeon: Annice Needy, MD;  Location: [facility] INVASIVE CV LAB;  Service: Cardiovascular;  Laterality: N/A;   ROTATOR CUFF REPAIR Left     SOCIAL HISTORY: Social History   Socioeconomic History   Marital status: Married    Spouse name: Not on file   Number of children: Not on file   Years of education: Not on file   Highest education level: Not on file  Occupational History   Not on file  Tobacco Use   Smoking status: Former    Current packs/day: 0.00    Average packs/day: 1 pack/day for 10.0 years (10.0 ttl pk-yrs)    Types: Cigars, Cigarettes    Start date: 02/05/2011    Quit date: 02/04/2021    Years since quitting: 2.8   Smokeless tobacco: Never  Substance and Sexual Activity   Alcohol use: Not Currently  Drug use: No   Sexual activity: Not on file  Other Topics Concern   Not on file  Social History Narrative   Not on file   Social Drivers of Health   Financial Resource Strain: High Risk (02/13/2023)   Received from [facility], [facility]   Overall Financial Resource Strain (CARDIA)    Difficulty of Paying Living Expenses: Hard  Food Insecurity: No Food Insecurity (02/13/2023)   Received from [facility], [facility]   Hunger Vital Sign    Worried About Running Out of Food in the Last Year: Never true    Ran Out of Food in the Last Year: Never true  Transportation Needs: No Transportation Needs (02/13/2023)   Received from [facility], [facility]   [facility] - Transportation    In the past 12 months, has lack of transportation kept you from medical appointments or from getting medications?: No    Lack of Transportation (Non-Medical): No  Physical Activity: Inactive (11/03/2022)   Exercise Vital Sign    Days of Exercise per Week: 0 days    Minutes  of Exercise per Session: 0 min  Stress: Stress Concern Present (11/03/2022)   [employer] of Occupational Health - Occupational Stress Questionnaire    Feeling of Stress : Rather much  Social Connections: Moderately Isolated (11/03/2022)   Social Connection and Isolation Panel [NHANES]    Frequency of Communication with Friends and Family: Twice a week    Frequency of Social Gatherings with Friends and Family: Three times a week    Attends Religious Services: 1 to 4 times per year    Active Member of Clubs or Organizations: No    Attends Banker Meetings: Never    Marital Status: Separated  Intimate Partner Violence: Not At Risk (11/03/2022)   Humiliation, Afraid, Rape, and Kick questionnaire    Fear of Current or Ex-Partner: No    Emotionally Abused: No    Physically Abused: No    Sexually Abused: No    FAMILY HISTORY: Family History  Problem Relation Age of Onset   Cancer Sister    Diabetes Mother    Cancer Maternal Grandmother    Cancer Paternal Grandmother     ALLERGIES:  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  MEDICATIONS:  Current Outpatient Medications  Medication Sig Dispense Refill   calcium-vitamin D (OSCAL WITH D) 500-5 MG-MCG tablet Take 2 tablets by mouth daily. 60 tablet 3   capecitabine (XELODA) 150 MG tablet TAKE 1 TABLET BY MOUTH 2 TIMES A DAY AFTER A MEAL FOR 14 DAYS ON, THEN 7 DAYS OFF, REPEAT EVERY 21 DAYS. (TOTAL DOSE OF 1,650 MG 2 TIMES A DAY) 28 tablet 1   capecitabine (XELODA) 500 MG tablet TAKE 3 TABLETS BY MOUTH 2 TIMES A DAY AFTER A MEAL FOR 14 DAYS ON, THEN 7 DAYS OFF, REPEAT EVERY 21 DAYS. (TOTAL DOSE OF 1,650 MG 2 TIMES A DAY) 84 tablet 1   citalopram (CELEXA) 10 MG tablet Take 1 tablet (10 mg total) by mouth daily. 30 tablet 3   clindamycin (CLINDAGEL) 1 % gel Apply topically 2 (two) times daily. 30 g 4   lidocaine-prilocaine (EMLA) cream Apply 1 Application topically as needed. Apply small amount to port and cover with saran wrap 1-2  hours prior to port access 30 g 2   loperamide (IMODIUM) 2 MG capsule Take 1 capsule (2 mg total) by mouth See admin instructions.  90 capsule 2   losartan (COZAAR) 25 MG tablet Take 1 tablet (25 mg total) by mouth daily. 30 tablet 0   magnesium chloride (SLOW-MAG) 64 MG TBEC SR tablet Take 1 tablet (64 mg total) by mouth daily. 30 tablet 1   ondansetron (ZOFRAN) 8 MG tablet Take 1 tablet (8 mg total) by mouth every 8 (eight) hours as needed for nausea or vomiting. 90 tablet 1   potassium chloride SA (KLOR-CON M) 20 MEQ tablet Take 2 tablets (40 mEq total) by mouth in the morning and take 1 tablet ( ) in the evening. 60 tablet 2   prochlorperazine (COMPAZINE) 10 MG tablet Take 1 tablet (10 mg total) by mouth every 6 (six) hours as needed for nausea or vomiting. 90 tablet 1   rivaroxaban (XARELTO) 20 MG TABS tablet Take 1 tablet (20 mg total) by mouth daily with supper. 30 tablet 4   senna (SENOKOT) 8.6 MG TABS tablet Take 2 tablets (17.2 mg total) by mouth daily. 100 tablet 0   traZODone (DESYREL) 50 MG tablet Take 1 tablet (50 mg total) by mouth at bedtime as needed for sleep. 30 tablet 3   No current facility-administered medications for this visit.   Facility-Administered Medications Ordered in Other Visits  Medication Dose Route Frequency Provider Last Rate Last Admin   0.9 %  sodium chloride infusion   Intravenous Continuous [name], MD   Stopped at 12/16/23 1107   heparin lock flush 100 unit/mL  500 Units Intravenous Once [name], MD         PHYSICAL EXAMINATION: ECOG PERFORMANCE STATUS: 1 - Symptomatic but completely ambulatory Vitals:   12/16/23 0857  BP: (!) 132/100  Pulse: 87  Resp: 18  Temp: (!) 96.1 F (35.6 C)  SpO2: 100%     Filed Weights   12/16/23 0857  Weight: 180 lb (81.6 kg)       Physical Exam HENT:     Head: Normocephalic and atraumatic.  Eyes:     General: No scleral icterus. Cardiovascular:     Rate and Rhythm: Normal rate and regular rhythm.      Heart sounds: Normal heart sounds.  Pulmonary:     Effort: Pulmonary effort is normal. No respiratory distress.     Breath sounds: No wheezing.     Comments: Decreased breath sound bilaterally.  Abdominal:     General: Bowel sounds are normal. There is no distension.     Palpations: Abdomen is soft.     Comments: Colostomy bag  Genitourinary:    Comments: History of APR, Musculoskeletal:        General: No deformity. Normal range of motion.     Cervical back: Normal range of motion and neck supple.  Skin:    General: Skin is warm and dry.     Findings: No erythema.  Neurological:     Mental Status: He is alert and oriented to person, place, and time. Mental status is at baseline.     Cranial Nerves: No cranial nerve deficit.  Psychiatric:        Mood and Affect: Mood normal.     LABORATORY DATA:  I have reviewed the data as listed    Latest Ref Rng & Units 12/16/2023    8:38 AM 11/25/2023    8:38 AM 11/04/2023    8:24 AM  CBC  WBC 4.0 - 10.5 K/uL 3.9  4.0  4.8   Hemoglobin 13.0 - 17.0 g/dL 78.2  95.6  15.7  Hematocrit 39.0 - 52.0 % 46.6  47.0  44.3   Platelets 150 - 400 K/uL 218  192  211       Latest Ref Rng & Units 12/16/2023    8:38 AM 11/25/2023    8:38 AM 11/04/2023    8:24 AM  CMP  Glucose 70 - 99 mg/dL 161  096  045   BUN 6 - 20 mg/dL 9  12  14    Creatinine 0.61 - 1.24 mg/dL 4.09  8.11  9.14   Sodium 135 - 145 mmol/L 138  136  139   Potassium 3.5 - 5.1 mmol/L 3.9  3.2  4.0   Chloride 98 - 111 mmol/L 104  105  110   CO2 22 - 32 mmol/L 25  26  21    Calcium 8.9 - 10.3 mg/dL 8.8  8.1  8.5   Total Protein 6.5 - 8.1 g/dL 7.1  7.2  7.4   Total Bilirubin 0.0 - 1.2 mg/dL 0.7  0.6  0.5   Alkaline Phos 38 - 126 U/L 88  85  98   AST 15 - 41 U/L 20  20  17    ALT 0 - 44 U/L 19  18  15       RADIOGRAPHIC STUDIES: I have personally reviewed the radiological images as listed and agreed with the findings in the report. CT CHEST ABDOMEN PELVIS W CONTRAST Result Date:  12/16/2023 CLINICAL DATA:  Restaging rectal cancer.  * Tracking Code: BO * EXAM: CT CHEST, ABDOMEN, AND PELVIS WITH CONTRAST TECHNIQUE: Multidetector CT imaging of the chest, abdomen and pelvis was performed following the standard protocol during bolus administration of intravenous contrast. RADIATION DOSE REDUCTION: This exam was performed according to the departmental dose-optimization program which includes automated exposure control, adjustment of the mA and/or kV according to patient size and/or use of iterative reconstruction technique. CONTRAST:  OMNIPAQUE IOHEXOL 300 MG/ML  SOLN COMPARISON:  08/10/2023 FINDINGS: CT CHEST FINDINGS Cardiovascular: Heart size appears within normal limits. No pericardial effusion. Aortic atherosclerosis. Mediastinum/Nodes: Thyroid gland, trachea and esophagus are unremarkable. No enlarged axillary, supraclavicular, mediastinal or hilar lymph nodes. Lungs/Pleura: No pleural effusion, airspace consolidation, atelectasis or pneumothorax. Centrilobular emphysema with diffuse bronchial wall thickening noted. Perifissural nodule along the lateral aspect of the oblique fissure measures 5 mm and most likely reflects a benign intrapulmonary lymph node. Unchanged from previous exam, image 102/4. Musculoskeletal: No chest wall mass or suspicious bone lesions identified. CT ABDOMEN PELVIS FINDINGS Hepatobiliary: Small subcapsular cyst along the inferior medial margin of the right lobe of liver is unchanged measuring 1.1 cm, image 77/2. Gallbladder appears normal. No bile duct dilatation. Pancreas: Unremarkable. No pancreatic ductal dilatation or surrounding inflammatory changes. Spleen: Normal in size without focal abnormality. Adrenals/Urinary Tract: Normal adrenal glands. No nephrolithiasis, hydronephrosis or suspicious mass. Stomach/Bowel: Stomach appears normal. Left lower quadrant colostomy. No pathologic dilatation of the large or small bowel loops. The appendix is visualized and  appears normal. Soft tissue mass within the posterior pelvis is again noted. On the axial images this measures 4.9 x 4.8 cm,, image 125/2. Previously 5.3 x 4.9 cm. Cephalocaudal extension within the presacral soft tissue space measures 9.3 cm, image 80/6. This is compared with 10.3 cm previously Similar appearance of tumor extension from the presacral soft tissue space to the posterior dome of the bladder. Tumor also appears to abut the posterior bladder base at the level of the seminal vesicles. Vascular/Lymphatic: Aortic atherosclerosis. No abdominal adenopathy identified. Within the right posterior  pelvic sidewall there is a soft tissue mass/enlarged lymph node which measures 3.1 x 2.6 cm, image 121/2. On the previous exam this measured 3.3 by 2.6 cm Reproductive: Poor definition of the prostate gland and seminal vesicles. Other: There is no ascites or focal fluid collections. No signs of pneumoperitoneum. Musculoskeletal: No acute or significant osseous findings. IMPRESSION: 1. Similar appearance of soft tissue mass within the posterior pelvis in the region of the rectum. This is stable to mildly decreased in the interval. As noted previously tumor extends along the presacral soft tissue space and abuts the posterior dome of bladder and posterior bladder base. 2. Stable appearance of soft tissue mass/enlarged lymph node within the right posterior pelvic sidewall. 3. No new or progressive disease identified. 4.  Aortic Atherosclerosis (ICD10-I70.0). Electronically Signed   By: Signa Kell M.D.   On: 12/16/2023 10:59

## 2023-12-16 NOTE — Patient Instructions (Signed)

## 2023-12-16 NOTE — Assessment & Plan Note (Signed)
 Continue Xarelto 20mg  daily.

## 2023-12-16 NOTE — Assessment & Plan Note (Signed)
 Continue  potassium to in AM and in PM.

## 2023-12-16 NOTE — Assessment & Plan Note (Signed)
 Recommend patient to continue slow Mag 1 tab daily.

## 2023-12-16 NOTE — Assessment & Plan Note (Addendum)
 locally advanced rectal cancer-Stage IIIB, s/p TNT neoadjuvant protocol, concurrent Xeloda and radiation-Finished 08/27/2021, s/p  APR,rectal adenocarcinoma, [id number]. Positive radial margin due to perforation. Biopsy proven recurrent rectal cancer, locally advanced disease, possible colovesical fistula; no distant metastatic disease on PET- per [name] Surgeon Dr. Luciano Cutter, recurrence is not resectable. --> June 2024 S/p concurrent Xeloda 825 mg/m2 twice daily  with radiation --> July 2024 MRI pelvis w wo contrast showed persistent disease. Disease is not resectable per colorectal surgeon --> FOLF[facility]I Panitumumab --> Dec 2025 partial response. --> per pt's preference switched to Xeloda + Panitumumab.  Labs are reviewed and discussed with patient.  Proceed with cycle 4 Xeloda 850 mg/m2 twice daily on days 1 to 14 of a 21-day cycle plus Panitumumab Q3 weeks. [Panel study regimen].  He tolerates regimen well.  CT result is back after his encounter. - stable disease- mild treatment response.

## 2023-12-17 LAB — CEA: CEA: 2 ng/mL (ref 0.0–4.7)

## 2023-12-28 ENCOUNTER — Other Ambulatory Visit: Payer: Self-pay

## 2023-12-30 ENCOUNTER — Encounter: Payer: Self-pay | Admitting: Oncology

## 2024-01-05 ENCOUNTER — Other Ambulatory Visit: Payer: Self-pay

## 2024-01-05 ENCOUNTER — Encounter: Payer: Self-pay | Admitting: Oncology

## 2024-01-06 ENCOUNTER — Inpatient Hospital Stay: Admitting: Oncology

## 2024-01-06 ENCOUNTER — Other Ambulatory Visit: Payer: Self-pay

## 2024-01-06 ENCOUNTER — Encounter: Payer: Self-pay | Admitting: Oncology

## 2024-01-06 ENCOUNTER — Inpatient Hospital Stay

## 2024-01-06 VITALS — BP 131/94 | HR 95 | Temp 96.2°F | Resp 18 | Wt 180.2 lb

## 2024-01-06 DIAGNOSIS — C20 Malignant neoplasm of rectum: Secondary | ICD-10-CM

## 2024-01-06 DIAGNOSIS — E876 Hypokalemia: Secondary | ICD-10-CM

## 2024-01-06 DIAGNOSIS — Z5112 Encounter for antineoplastic immunotherapy: Secondary | ICD-10-CM | POA: Diagnosis not present

## 2024-01-06 DIAGNOSIS — I10 Essential (primary) hypertension: Secondary | ICD-10-CM

## 2024-01-06 DIAGNOSIS — I2699 Other pulmonary embolism without acute cor pulmonale: Secondary | ICD-10-CM

## 2024-01-06 DIAGNOSIS — Z5111 Encounter for antineoplastic chemotherapy: Secondary | ICD-10-CM | POA: Diagnosis not present

## 2024-01-06 LAB — CBC WITH DIFFERENTIAL (CANCER CENTER ONLY)
Abs Immature Granulocytes: 0.01 10*3/uL (ref 0.00–0.07)
Basophils Absolute: 0 10*3/uL (ref 0.0–0.1)
Basophils Relative: 1 %
Eosinophils Absolute: 0.1 10*3/uL (ref 0.0–0.5)
Eosinophils Relative: 2 %
HCT: 45.4 % (ref 39.0–52.0)
Hemoglobin: 16.1 g/dL (ref 13.0–17.0)
Immature Granulocytes: 0 %
Lymphocytes Relative: 26 %
Lymphs Abs: 1.1 10*3/uL (ref 0.7–4.0)
MCH: 32.7 pg (ref 26.0–34.0)
MCHC: 35.5 g/dL (ref 30.0–36.0)
MCV: 92.1 fL (ref 80.0–100.0)
Monocytes Absolute: 0.6 10*3/uL (ref 0.1–1.0)
Monocytes Relative: 15 %
Neutro Abs: 2.4 10*3/uL (ref 1.7–7.7)
Neutrophils Relative %: 56 %
Platelet Count: 188 10*3/uL (ref 150–400)
RBC: 4.93 MIL/uL (ref 4.22–5.81)
RDW: 13.5 % (ref 11.5–15.5)
Smear Review: NORMAL
WBC Count: 4.2 10*3/uL (ref 4.0–10.5)
nRBC: 0 % (ref 0.0–0.2)

## 2024-01-06 LAB — CMP (CANCER CENTER ONLY)
ALT: 23 U/L (ref 0–44)
AST: 24 U/L (ref 15–41)
Albumin: 3.6 g/dL (ref 3.5–5.0)
Alkaline Phosphatase: 83 U/L (ref 38–126)
Anion gap: 9 (ref 5–15)
BUN: 9 mg/dL (ref 6–20)
CO2: 25 mmol/L (ref 22–32)
Calcium: 8.6 mg/dL — ABNORMAL LOW (ref 8.9–10.3)
Chloride: 104 mmol/L (ref 98–111)
Creatinine: 0.93 mg/dL (ref 0.61–1.24)
GFR, Estimated: >60 mL/min (ref >60)
Glucose, Bld: 162 mg/dL — ABNORMAL HIGH (ref 70–99)
Potassium: 3.2 mmol/L — ABNORMAL LOW (ref 3.5–5.1)
Sodium: 138 mmol/L (ref 135–145)
Total Bilirubin: 0.7 mg/dL (ref 0.0–1.2)
Total Protein: 7.2 g/dL (ref 6.5–8.1)

## 2024-01-06 LAB — MAGNESIUM: Magnesium: 1.5 mg/dL — ABNORMAL LOW (ref 1.7–2.4)

## 2024-01-06 MED ORDER — CAPECITABINE 150 MG PO TABS: ORAL_TABLET | ORAL | 1 refills | Status: AC

## 2024-01-06 MED ORDER — CAPECITABINE 500 MG PO TABS: ORAL_TABLET | ORAL | 1 refills | Status: AC

## 2024-01-06 MED ORDER — MAGNESIUM CHLORIDE 64 MG PO TBEC
1.0000 | DELAYED_RELEASE_TABLET | Freq: Every day | ORAL | 1 refills | Status: DC
  Filled 2024-01-06: qty 60, 60d supply, fill #0

## 2024-01-06 MED ORDER — SODIUM CHLORIDE 0.9 % IV SOLN
9.0000 mg/kg | Freq: Once | INTRAVENOUS | Status: AC
  Administered 2024-01-06: 700 mg via INTRAVENOUS
  Filled 2024-01-06: qty 15

## 2024-01-06 MED ORDER — SODIUM CHLORIDE 0.9 % IV SOLN
INTRAVENOUS | Status: DC
  Filled 2024-01-06: qty 250

## 2024-01-06 MED ORDER — MAGNESIUM SULFATE 2 GM/50ML IV SOLN
2.0000 g | Freq: Once | INTRAVENOUS | Status: DC
Start: 2024-01-06 — End: 2024-01-06

## 2024-01-06 MED ORDER — RIVAROXABAN 20 MG PO TABS
20.0000 mg | ORAL_TABLET | Freq: Every day | ORAL | 1 refills | Status: DC
  Filled 2024-01-06: qty 30, 30d supply, fill #0

## 2024-01-06 MED ORDER — HEPARIN SOD (PORK) LOCK FLUSH 100 UNIT/ML IV SOLN
500.0000 [IU] | Freq: Once | INTRAVENOUS | Status: AC | PRN
  Administered 2024-01-06: 500 [IU]
  Filled 2024-01-06: qty 5

## 2024-01-06 MED ORDER — LOSARTAN POTASSIUM 25 MG PO TABS
25.0000 mg | ORAL_TABLET | Freq: Every day | ORAL | 1 refills | Status: DC
  Filled 2024-01-06: qty 90, 90d supply, fill #0

## 2024-01-06 NOTE — Assessment & Plan Note (Signed)
 Chemotherapy plan as listed

## 2024-01-06 NOTE — Assessment & Plan Note (Signed)
 Continue Xarelto 20mg  daily.

## 2024-01-06 NOTE — Assessment & Plan Note (Signed)
 His diastolic BP is chronically elevated.  I recommend losartan  25mg  daily. Recommend patient to monitor BP at home.  I have provided list of local PCP provider office information and urged him to establish care with PCP

## 2024-01-06 NOTE — Progress Notes (Signed)
 Hematology/Oncology Progress note [phone] [phone]      CHIEF COMPLAINTS/REASON FOR VISIT:  Follow up for recurrent rectal cancer treatment  ASSESSMENT & PLAN:   Cancer Staging  Rectal cancer [facility]) Staging form: Colon and Rectum, AJCC 8th Edition - Clinical stage from 02/08/2021: Stage IIIB (cT4a, cN1, cM0) - Signed by Barry Forbes, MD on 03/06/2021   Rectal cancer [facility]) locally advanced rectal cancer-Stage IIIB, s/p TNT neoadjuvant protocol, concurrent Xeloda  and radiation-Finished 08/27/2021, s/p  APR,rectal adenocarcinoma, ypT3N0. Positive radial margin due to perforation. Biopsy proven recurrent rectal cancer, locally advanced disease, possible colovesical fistula; no distant metastatic disease on PET- per [name] Surgeon Dr. Lenore Rafter, recurrence is not resectable. --> June 2024 S/p concurrent Xeloda  825 mg/m2 twice daily  with radiation --> July 2024 MRI pelvis w wo contrast showed persistent disease. Disease is not resectable per colorectal surgeon --> FOLFIRI Panitumumab  --> Dec 2025 partial response. --> per pt's preference switched to Xeloda  + Panitumumab . [Pt declined 5-FU pump] Labs are reviewed and discussed with patient.  Proceed with cycle 4 Xeloda  850 mg/m2 twice daily on days 1 to 14 of a 21-day cycle plus Panitumumab  Q3 weeks. [Panel study regimen].  He tolerates regimen well.  CT results were reviewed.  stable disease- mild treatment response.   Hypomagnesemia Recommend patient to continue slow Mag 1 tab daily.  Recommend IV Mag sulfate 2g x 1  Acute pulmonary embolism (HCC) Continue  Xarelto  20mg  daily.  Encounter for antineoplastic chemotherapy Chemotherapy plan as listed  Hypertension His diastolic BP is chronically elevated.  I recommend losartan  25mg  daily. Recommend patient to monitor BP at home.  I have provided list of local PCP provider office information and urged him to establish care with PCP  Hypocalcemia Continue calcium   1000mg  daily.  Check Vitamin D level  Hypokalemia Continue  potassium to 40meq in AM and 20meq in PM.        Orders Placed This Encounter  Procedures   Magnesium     Standing Status:   Future    Expected Date:   01/27/2024    Expiration Date:   01/26/2025   CEA    Standing Status:   Future    Expected Date:   01/27/2024    Expiration Date:   01/26/2025   CBC with Differential (Cancer Center Only)    Standing Status:   Future    Expected Date:   01/27/2024    Expiration Date:   01/26/2025   CMP (Cancer Center only)    Standing Status:   Future    Expected Date:   01/27/2024    Expiration Date:   01/26/2025    Follow-up  3 weeks All questions were answered. The patient knows to call the clinic with any problems, questions or concerns.  Barry Forbes, MD, PhD [facility] Hematology Oncology 01/06/2024     HISTORY OF PRESENTING ILLNESS:   Barry [name]. is a  58 y.o.  male presents for rectal cancer Oncology History  Rectal cancer (HCC)  02/08/2021 Initial Diagnosis   Rectal cancer   02/05/2021-02/06/2021 patient was hospitalized due to generalized weakness, intermittent lightheadedness, weight loss and worsening constipation.  02/05/2021 CT abdomen showed concerning of severe rectal wall thickening and right internal iliac lymph node concerning for metastatic disease.  Patient was seen by gastroenterology and had colonoscopy which showed a circumferential fungating mass in the rectum.  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   02/14/2021-02/16/2021 hospitalized due to rectal bleeding and pain.  02/14/2021 CT showed perirectal fluid collection/gas concerning for infection with significant leukocytosis, anemia with hemoglobin of 6.8.  Patient received PRBC transfusion, IV antibiotics.  He underwent an IR guided placement of JP drain into the rectal abscess.  Discharged home with oral Augmentin . 02/20/2021 presented to ER with new skin opening and draining from  left gluteus.  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  JP drain has been removed.  Patient was continued on Augmentin .   02/13/2021, PET scan showed locally advanced rectal cancer with billowing of the mesorectum now with low attenuation material that was not present on previous examination with extensive stranding and inflammation.   Bulky RIGHT pelvic sidewall/hypogastric lymph node outside of the mesorectum with stippled calcification measuring 19 mm-no increased metabolic activity. Small LEFT hypogastric lymph node just peripheral to the internal external bifurcation-SUV 3.2 High RIGHT internal just below or at the internal/common iliac transition, lymph node -SUV 4.8 LEFT high hypogastric lymph node  8 mm-SUV 3. Scattered lymph nodes throughout the retroperitoneum with low FDG uptake Bilateral inguinal lymph nodes largest on the RIGHT (image 248/3) 11 mm with a maximum SUV of 2.8 spiculated nodule in the LEFT upper lobe- 9 x 8 mm       02/14/2021, CT abdomen pelvis without contrast Showed perforated rectal mass with contained perforation with fluid and gas extending above and below the pelvic floor,potentially involving the sphincter complex and extending into LEFT ischial rectal fossa   02/20/2021, CT pelvis with contrast showed Large perirectal/perianal abscess has markedly decreased in size since placement of the percutaneous drain. There are residual gas-filled collections in the soft tissues and suspect there is a fistula or sinus tract between the rectal mass and the subcutaneous tissues. Soft tissue gas along the medial left buttock and concern for a cutaneous ulceration in this area. Large rectal mass with evidence for a large necrotic right pelvic lymph node.   His case is complicated with a perirectal abscess. Prior to onset of perirectal abscess, on his 02/05/2021 scan, he was noted to have right internal iliac lymph node 3 x 1 x 2.3 cm which is concerning for nodal disease.On  Subsequent images it was difficult to distinguish whether lymphadenopathy was due to nodal disease versus acute inflammation. 03/07/2021 MRI pelvis cT3 N2. Due to the possible contained perforation on previous CT, possible cT4 disease.    02/27/2021 medi port placed by Dr.Dew 03/13/2021 reports right butt cheek and also left perianal area fullness.he was seen by Dr.Pabon urgently  and had right buttock abscess drained. Left perianal fullness was felt to be due to cancer.    02/08/2021 Cancer Staging   Staging form: Colon and Rectum, AJCC 8th Edition - Clinical stage from 02/08/2021: Stage IIIB (cT4a, cN1, cM0) - Signed by Barry Forbes, MD on 03/06/2021 Stage prefix: Initial diagnosis   02/27/2021 Procedure   medi port placed by Dr.Dew   03/18/2021 - 07/05/2021 Chemotherapy    FOLFOX q14d x 4 months      07/17/2021 - 08/27/2021 Chemotherapy   Xeloda  concurrent with Radiation   11/18/2021 Surgery   patient is status post open APR with flap for rectal cancer. Pathology rectal adenomacarcinoma ypT3N0.  Positive margin: Radial (circumferential) or mesenteric: adjacent to perforation      01/27/2022 - 02/02/2022 Hospital Admission   Hospitalized at [facility] due to recurrent abscess.  US  guided drain into the pelvic abscess with return of 60 ml of purulent fluid. Cultures positive for staph aureus and strep agalactiae group b, fungal  cultures negative.    02/25/2022 Imaging   CT chest angiogram with and without contrast, CT abdomen pelvis with and without contrast 1. Negative for acute pulmonary embolus.2. Emphysema. Further decrease in size of the previously noted irregular nodule in the left upper lobe which is now barely measurable today. No new suspicious lung nodules 3. Status post left lower quadrant colostomy. Further decrease in size since comparison exam from May of the rim enhancing gas and fluid collection at the pelvic surgical bed extending from the perineum superiorly into the pelvis 4. Slightly  thickened appearance of terminal ileal small bowel loops in the pelvis with mild stranding suggesting small bowel inflammatory process. 5. Stable enlarged right pelvic sidewall lymph node   06/02/2022 Imaging   CT chest abdomen pelvis w contrast 1. Status post abdominal perineal resection with descending colostomy. 2. At the site of pelvic fluid and gas collection on 02/25/2022, there is residual, decreased presacral soft tissue fullness, without drainable collection. 3. Similar right obturator nodal metastasis.4.  No acute process or evidence of metastatic disease in the chest. 5. Age advanced coronary artery atherosclerosis. Recommend assessment of coronary risk factors. 6. Aortic atherosclerosis and emphysema     06/26/2022 Imaging   CT abdomen pelvis with contrast showed 1. Interval increase in size of a lobulated partially imaged at least 7.8 x 3.8 x 12 cm abscess in the perianal region in a patient status post abdominal perineal resection and left lower lobe end colostomy formation. Finding extends to involve the pre sacral region up to the urinary dome level. Associated posterior urinary bladder wall thickening with lack of intraperitoneal fat plane between the presacral soft tissue thickening/abscess formation suggestive of possible fistulization and invasion of the posterior bladder wall. Underlying recurrent malignancy is not excluded. 2. Stable 2.7 cm right pelvic sidewall lymph node. 3.  Aortic Atherosclerosis   06/26/2022 - 06/27/2022 Hospital Admission   He went to [facility] ER and CT showed recurrent abscess. He was transferred to [facility] due to recurrent abscess, treated with IV vanc/zosyn , IR was consulted and drainage tube was placed. Wound grew up Strep viridans. Patient was seen by ID at [facility] discharged with Augmentin  for 2 weeks.   He was seen by wound care Dr> Miki Alert on 07/09/2022, JP drain was removed.    09/29/2022 Imaging   MRI pelvis without contrast showed  Interval  abdominoperitoneal resection since prior MRI. Decreased small fluid collection in the surgical bed compared with more recent CT, consistent with resolving postoperative fluid collection or abscess.   Bulky rounded presacral mass, which shows diffuse restricted diffusion, without significant change in size since most recent CT of 06/26/2022. This raises suspicion for recurrent carcinoma over post treatment changes. Recommend correlation with CEA level, and consider tissue sampling or PET-CT.   Stable enlarged right pelvic sidewall lymph node. No new or increased adenopathy identified.   10/09/2022 Imaging   CT chest with contrast showed 1. Unchanged 0.4 cm fissural nodule of the anterior left lower lobe. This is almost certainly a benign fissural lymph node. No new or suspicious pulmonary nodules. 2. Moderate emphysema and diffuse bilateral bronchial wall thickening. 3. Coronary artery disease.   10/13/2022 Imaging   CT abdomen pelvis with contrast showed 1. Complex collection persists in the lower pelvis, extending from the anal verge upwards into the presacral space and anteriorly from the presacral space to the bladder dome, not significantly changed in size or extent compared to the earlier CT of 06/26/2022. This is most likely a  combination of a postoperative seroma and/or chronic phlegmon/abscess versus recurrent abscess. 2. Bladder walls are thick walled/edematous. Given the contiguity of the presacral collection and the bladder dome, this is highly suspicious for a related bladder wall infection and possibly secondary to colovesical fistula. Recommend correlation with urinalysis. 3. No evidence of bowel obstruction. LEFT lower abdominal wall colostomy, without obstruction or inflammatory change. 4. No free intraperitoneal air   10/13/2022 [facility] Admission   Hospitalized due to fever and rectal pain. Urine culture showed mixed urogenital flora. IR CT guided Aspiration of abscess showed  Streptococcal Viridans. He was placed back on Augmentin   10/14/2023 CT read by [facility] radiology Postsurgical changes of abdominoperineal resection. Interval enlargement of  fluid collection in the surgical bed. Multiple foci of enhancing tissue at  the margins of this fluid collection, increased from prior study, suspicious for local recurrence.   Prominent retroperitoneal lymph nodes, some which are increased in size from prior study. These are indeterminate and may be either reactive or represent metastatic disease. Recommend continued attention on follow-up.   Centrally hypoattenuating right obturator lymph node, unchanged in size, consistent with treated disease.   Circumferential bladder wall thickening and irregularity, most pronounced on the left side. This may be reactive in the setting of adjacent pelvic inflammatory changes. Correlate with urinalysis if there is clinical  concern for urinary tract infection.    11/14/2022 Imaging   MRI pelvis w wo contrast  1. Status post abdominoperineal resection with left lower quadrant end colostomy. 2. Compared to prior CT and MR, no significant change in appearance of heterogeneous, rim enhancing presacral and low pelvic soft tissue and fluid. Largest heterogeneously enhancing conglomerate of fluid and soft tissue appears to closely involve the anteriorly abutting the seminal vesicles and measures 3.0 x 2.9 cm in largest axial dimension. This extends to superiorly contact the posterior bladder dome and inferiorly towards the gluteal cleft. 3. Discrete fluid component at the most inferior extent measuring 2.4 x 1.3 cm. This is markedly diminished in volume compared to more remote previous examinations, for example 06/26/2022. 4. Constellation of findings is highly concerning for locally recurrent rectal malignancy with or without superimposed infection. Presence or absence of infection at this time is not established by MR. 5. Additional unchanged  hemorrhagic or proteinaceous fluid collection in the right hemipelvis measuring 3.2 x 2.6 cm most consistent with postoperative hematoma or seroma. 6. Unchanged thickening of the urinary bladder wall. As previously reported, fistula or involvement of bladder wall by malignancy not excluded.   12/09/2022 Procedure   CT guided biopsy of the pelvic mass showed Moderately differentiated adenocarcinoma with dirty necrosis, morphologically identical to patient's prior rectal adenocarcinoma.   [facility] NGS xT 648 panel showed MLH3 start loss,  BCORL1 frame shift, TP53 splice region varian, APC stop gain,  TMB 7.9, MS stable, KRAS/BRAF/NRAS negative.   [facility] NGS xR showed no gene arrangement or reportable altered splicing events in RNA sequencing      01/07/2023 -  Chemotherapy   Xeloda  825 mg/m2 twice daily  with radiation   02/11/2023 Imaging   CT abdomen pelvis w contrast  1. Examination is positive for small bowel obstruction. Transition point is identified within the left iliac fossa. Distal to the transition point the small bowel loops exhibit mucosal enhancement and wall thickening concerning for enteritis. 2. There is new right-sided hydronephrosis and hydroureter up to the level of the bifurcation of right common iliac artery. The distal right ureter appears closely associated with  the previously characterized tracer avid presacral soft tissue mass. Cannot exclude obstructive uropathy secondary to tumor involvement. 3. Similar appearance of presacral soft tissue mass. This was tracer avid on the recent PET-CT from 12/17/2022, and concerning for locally recurrent tumor. 4. Unchanged diffuse circumferential wall thickening involving the urinary bladder. 5. Aortic Atherosclerosis    04/17/2023 - 07/29/2023 Chemotherapy   Patient is on Treatment Plan : COLORECTAL FOLFIRI + Panitumumab  q14d     08/14/2023 Imaging   CT chest abdomen pelvis w contrast showed  Heterogeneous central pelvic mass in the  area of the rectum overall similar configuration to the previous CT scan. Again the lesion abuts the bladder in the area of the seminal vesicles. Extension up along the presacral space. Surgical changes from diverting colostomy.   Essentially stable right pelvic sidewall soft tissue mass or nodal enlargement. No new nodal enlargement elsewhere in the chest, abdomen or pelvis.   Fatty liver infiltration.   Subtle nodular opacity along the right lung base. Infiltrates possible. Please correlate with symptoms and recommend follow-up.   Incidental right lower lobe segmental and lobar pulmonary embolism.     09/23/2023 -  Chemotherapy   Patient is on Treatment Plan : COLORECTAL Xeloda  + Panitumumab  q21d     12/10/2023 Imaging   CT chest abdomen pelvis w contrast showed 1. Similar appearance of soft tissue mass within the posterior pelvis in the region of the rectum. This is stable to mildly decreased in the interval. As noted previously tumor extends along the presacral soft tissue space and abuts the posterior dome of bladder and posterior bladder base. 2. Stable appearance of soft tissue mass/enlarged lymph node within the right posterior pelvic sidewall. 3. No new or progressive disease identified. 4.  Aortic Atherosclerosis (ICD10-I70.0).       INTERVAL HISTORY Barry [name]. is a 58 y.o. male who has above history reviewed by me today presents for follow up visit for management of recurrent locally advanced rectal cancer. Denies fever chills, rectal pain. Denies rectal discharge.   No diarrhea after each treatments.   Denies nausea vomiting or abdominal pain.  He reports feeling tired, SOB + exertion. No leg swelling.  He reports that he has been compliant with Xarelto  20mg  daily      Review of Systems  Constitutional:  Positive for fatigue. Negative for appetite change, chills, fever and unexpected weight change.  HENT:   Negative for hearing loss and voice change.    Eyes:  Negative for eye problems and icterus.  Respiratory:  Negative for chest tightness, cough and shortness of breath.   Cardiovascular:  Negative for chest pain and leg swelling.  Gastrointestinal:  Negative for abdominal distention, abdominal pain, blood in stool, constipation, diarrhea and rectal pain.  Endocrine: Negative for hot flashes.  Genitourinary:  Negative for difficulty urinating, dysuria and frequency.   Musculoskeletal:  Negative for arthralgias.  Skin:  Negative for itching and rash.  Neurological:  Negative for light-headedness and numbness.  Hematological:  Negative for adenopathy. Does not bruise/bleed easily.  Psychiatric/Behavioral:  Negative for confusion.     MEDICAL HISTORY:  Past Medical History:  Diagnosis Date   Headache    Left shoulder pain    Rectal cancer (HCC)     SURGICAL HISTORY: Past Surgical History:  Procedure Laterality Date   COLON SURGERY     COLONOSCOPY WITH PROPOFOL  N/A 02/06/2021   Procedure: COLONOSCOPY WITH PROPOFOL ;  Surgeon: Selena Daily, MD;  Location: [facility] ENDOSCOPY;  Service:  Gastroenterology;  Laterality: N/A;   PORTA CATH INSERTION N/A 02/27/2021   Procedure: PORTA CATH INSERTION;  Surgeon: Celso College, MD;  Location: [facility] INVASIVE CV LAB;  Service: Cardiovascular;  Laterality: N/A;   ROTATOR CUFF REPAIR Left     SOCIAL HISTORY: Social History   Socioeconomic History   Marital status: Married    Spouse name: Not on file   Number of children: Not on file   Years of education: Not on file   Highest education level: Not on file  Occupational History   Not on file  Tobacco Use   Smoking status: Former    Current packs/day: 0.00    Average packs/day: 1 pack/day for 10.0 years (10.0 ttl pk-yrs)    Types: Cigars, Cigarettes    Start date: 02/05/2011    Quit date: 02/04/2021    Years since quitting: 2.9   Smokeless tobacco: Never  Substance and Sexual Activity   Alcohol use: Not Currently   Drug use: No    Sexual activity: Not on file  Other Topics Concern   Not on file  Social History Narrative   Not on file   Social Drivers of Health   Financial Resource Strain: High Risk (02/13/2023)   Received from [facility], [facility]   Overall Financial Resource Strain (CARDIA)    Difficulty of Paying Living Expenses: Hard  Food Insecurity: No Food Insecurity (02/13/2023)   Received from [facility], [facility]   Hunger Vital Sign    Worried About Running Out of Food in the Last Year: Never true    Ran Out of Food in the Last Year: Never true  Transportation Needs: No Transportation Needs (02/13/2023)   Received from [facility], [facility]   [facility] - Transportation    In the past 12 months, has lack of transportation kept you from medical appointments or from getting medications?: No    Lack of Transportation (Non-Medical): No  Physical Activity: Inactive (11/03/2022)   Exercise Vital Sign    Days of Exercise per Week: 0 days    Minutes of Exercise per Session: 0 min  Stress: Stress Concern Present (11/03/2022)   [employer] of Occupational Health - Occupational Stress Questionnaire    Feeling of Stress : Rather much  Social Connections: Moderately Isolated (11/03/2022)   Social Connection and Isolation Panel [NHANES]    Frequency of Communication with Friends and Family: Twice a week    Frequency of Social Gatherings with Friends and Family: Three times a week    Attends Religious Services: 1 to 4 times per year    Active Member of Clubs or Organizations: No    Attends Banker Meetings: Never    Marital Status: Separated  Intimate Partner Violence: Not At Risk (11/03/2022)   Humiliation, Afraid, Rape, and Kick questionnaire    Fear of Current or Ex-Partner: No    Emotionally Abused: No    Physically Abused: No    Sexually Abused: No    FAMILY HISTORY: Family  History  Problem Relation Age of Onset   Cancer Sister    Diabetes Mother    Cancer Maternal Grandmother    Cancer Paternal Grandmother     ALLERGIES:  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  MEDICATIONS:  Current Outpatient Medications  Medication Sig Dispense Refill   calcium -vitamin D (OSCAL WITH D) 500-5 MG-MCG tablet Take 2 tablets by mouth daily. 60 tablet 3  citalopram  (CELEXA ) 10 MG tablet Take 1 tablet (10 mg total) by mouth daily. 30 tablet 3   clindamycin  (CLINDAGEL ) 1 % gel Apply topically 2 (two) times daily. 30 g 4   lidocaine -prilocaine  (EMLA ) cream Apply 1 Application topically as needed. Apply small amount to port and cover with saran wrap 1-2 hours prior to port access 30 g 2   loperamide  (IMODIUM ) 2 MG capsule Take 1 capsule (2 mg total) by mouth See admin instructions. 90 capsule 2   ondansetron  (ZOFRAN ) 8 MG tablet Take 1 tablet (8 mg total) by mouth every 8 (eight) hours as needed for nausea or vomiting. 90 tablet 1   potassium chloride  SA (KLOR-CON  M) 20 MEQ tablet Take 2 tablets (40 mEq total) by mouth in the morning and take 1 tablet ( ) in the evening. 60 tablet 2   prochlorperazine  (COMPAZINE ) 10 MG tablet Take 1 tablet (10 mg total) by mouth every 6 (six) hours as needed for nausea or vomiting. 90 tablet 1   rivaroxaban  (XARELTO ) 20 MG TABS tablet Take 1 tablet (20 mg total) by mouth daily with supper. 30 tablet 1   senna (SENOKOT) 8.6 MG TABS tablet Take 2 tablets (17.2 mg total) by mouth daily. 100 tablet 0   traZODone  (DESYREL ) 50 MG tablet Take 1 tablet (50 mg total) by mouth at bedtime as needed for sleep. 30 tablet 3   capecitabine  (XELODA ) 150 MG tablet TAKE 1 TABLET BY MOUTH 2 TIMES A DAY AFTER A MEAL FOR 14 DAYS ON, THEN 7 DAYS OFF, REPEAT EVERY 21 DAYS. (TOTAL DOSE OF 1,650 MG 2 TIMES A DAY) 28 tablet 1   capecitabine  (XELODA ) 500 MG tablet TAKE 3 TABLETS BY MOUTH 2 TIMES A DAY AFTER A MEAL FOR 14 DAYS ON, THEN 7 DAYS OFF, REPEAT EVERY 21 DAYS. (TOTAL  DOSE OF 1,650 MG 2 TIMES A DAY) 84 tablet 1   losartan  (COZAAR ) 25 MG tablet Take 1 tablet (25 mg total) by mouth daily. 90 tablet 1   magnesium  chloride (SLOW-MAG) 64 MG TBEC SR tablet Take 1 tablet (64 mg total) by mouth daily. 90 tablet 1   No current facility-administered medications for this visit.   Facility-Administered Medications Ordered in Other Visits  Medication Dose Route Frequency Provider Last Rate Last Admin   0.9 %  sodium chloride  infusion   Intravenous Continuous Barry Forbes, MD   Stopped at 01/06/24 1107   heparin  lock flush 100 unit/mL  500 Units Intravenous Once Barry Forbes, MD       magnesium  sulfate IVPB 2 g 50 mL  2 g Intravenous Once Barry Forbes, MD         PHYSICAL EXAMINATION: ECOG PERFORMANCE STATUS: 1 - Symptomatic but completely ambulatory Vitals:   01/06/24 0906  BP: (!) 131/94  Pulse: 95  Resp: 18  Temp: (!) 96.2 F (35.7 C)  SpO2: 100%     Filed Weights   01/06/24 0906  Weight: 180 lb 3.2 oz (81.7 kg)       Physical Exam HENT:     Head: Normocephalic and atraumatic.  Eyes:     General: No scleral icterus. Cardiovascular:     Rate and Rhythm: Normal rate and regular rhythm.     Heart sounds: Normal heart sounds.  Pulmonary:     Effort: Pulmonary effort is normal. No respiratory distress.     Breath sounds: No wheezing.     Comments: Decreased breath sound bilaterally.  Abdominal:     General: Bowel  sounds are normal. There is no distension.     Palpations: Abdomen is soft.     Comments: Colostomy bag  Genitourinary:    Comments: History of APR, Musculoskeletal:        General: No deformity. Normal range of motion.     Cervical back: Normal range of motion and neck supple.  Skin:    General: Skin is warm and dry.     Findings: No erythema.  Neurological:     Mental Status: He is alert and oriented to person, place, and time. Mental status is at baseline.     Cranial Nerves: No cranial nerve deficit.  Psychiatric:        Mood and  Affect: Mood normal.     LABORATORY DATA:  I have reviewed the data as listed    Latest Ref Rng & Units 01/06/2024    8:54 AM 12/16/2023    8:38 AM 11/25/2023    8:38 AM  CBC  WBC 4.0 - 10.5 K/uL 4.2  3.9  4.0   Hemoglobin 13.0 - 17.0 g/dL 65.7  84.6  96.2   Hematocrit 39.0 - 52.0 % 45.4  46.6  47.0   Platelets 150 - 400 K/uL 188  218  192       Latest Ref Rng & Units 01/06/2024    8:54 AM 12/16/2023    8:38 AM 11/25/2023    8:38 AM  CMP  Glucose 70 - 99 mg/dL 952  841  324   BUN 6 - 20 mg/dL 9  9  12    Creatinine 0.61 - 1.24 mg/dL 4.01  0.27  2.53   Sodium 135 - 145 mmol/L 138  138  136   Potassium 3.5 - 5.1 mmol/L 3.2  3.9  3.2   Chloride 98 - 111 mmol/L 104  104  105   CO2 22 - 32 mmol/L 25  25  26    Calcium  8.9 - 10.3 mg/dL 8.6  8.8  8.1   Total Protein 6.5 - 8.1 g/dL 7.2  7.1  7.2   Total Bilirubin 0.0 - 1.2 mg/dL 0.7  0.7  0.6   Alkaline Phos 38 - 126 U/L 83  88  85   AST 15 - 41 U/L 24  20  20    ALT 0 - 44 U/L 23  19  18       RADIOGRAPHIC STUDIES: I have personally reviewed the radiological images as listed and agreed with the findings in the report. CT CHEST ABDOMEN PELVIS W CONTRAST Result Date: 12/16/2023 CLINICAL DATA:  Restaging rectal cancer.  * Tracking Code: BO * EXAM: CT CHEST, ABDOMEN, AND PELVIS WITH CONTRAST TECHNIQUE: Multidetector CT imaging of the chest, abdomen and pelvis was performed following the standard protocol during bolus administration of intravenous contrast. RADIATION DOSE REDUCTION: This exam was performed according to the departmental dose-optimization program which includes automated exposure control, adjustment of the mA and/or kV according to patient size and/or use of iterative reconstruction technique. CONTRAST:  OMNIPAQUE  IOHEXOL  300 MG/ML  SOLN COMPARISON:  08/10/2023 FINDINGS: CT CHEST FINDINGS Cardiovascular: Heart size appears within normal limits. No pericardial effusion. Aortic atherosclerosis. Mediastinum/Nodes: Thyroid gland,  trachea and esophagus are unremarkable. No enlarged axillary, supraclavicular, mediastinal or hilar lymph nodes. Lungs/Pleura: No pleural effusion, airspace consolidation, atelectasis or pneumothorax. Centrilobular emphysema with diffuse bronchial wall thickening noted. Perifissural nodule along the lateral aspect of the oblique fissure measures 5 mm and most likely reflects a benign intrapulmonary lymph node. Unchanged from previous  exam, image 102/4. Musculoskeletal: No chest wall mass or suspicious bone lesions identified. CT ABDOMEN PELVIS FINDINGS Hepatobiliary: Small subcapsular cyst along the inferior medial margin of the right lobe of liver is unchanged measuring 1.1 cm, image 77/2. Gallbladder appears normal. No bile duct dilatation. Pancreas: Unremarkable. No pancreatic ductal dilatation or surrounding inflammatory changes. Spleen: Normal in size without focal abnormality. Adrenals/Urinary Tract: Normal adrenal glands. No nephrolithiasis, hydronephrosis or suspicious mass. Stomach/Bowel: Stomach appears normal. Left lower quadrant colostomy. No pathologic dilatation of the large or small bowel loops. The appendix is visualized and appears normal. Soft tissue mass within the posterior pelvis is again noted. On the axial images this measures 4.9 x 4.8 cm,, image 125/2. Previously 5.3 x 4.9 cm. Cephalocaudal extension within the presacral soft tissue space measures 9.3 cm, image 80/6. This is compared with 10.3 cm previously Similar appearance of tumor extension from the presacral soft tissue space to the posterior dome of the bladder. Tumor also appears to abut the posterior bladder base at the level of the seminal vesicles. Vascular/Lymphatic: Aortic atherosclerosis. No abdominal adenopathy identified. Within the right posterior pelvic sidewall there is a soft tissue mass/enlarged lymph node which measures 3.1 x 2.6 cm, image 121/2. On the previous exam this measured 3.3 by 2.6 cm Reproductive: Poor  definition of the prostate gland and seminal vesicles. Other: There is no ascites or focal fluid collections. No signs of pneumoperitoneum. Musculoskeletal: No acute or significant osseous findings. IMPRESSION: 1. Similar appearance of soft tissue mass within the posterior pelvis in the region of the rectum. This is stable to mildly decreased in the interval. As noted previously tumor extends along the presacral soft tissue space and abuts the posterior dome of bladder and posterior bladder base. 2. Stable appearance of soft tissue mass/enlarged lymph node within the right posterior pelvic sidewall. 3. No new or progressive disease identified. 4.  Aortic Atherosclerosis (ICD10-I70.0). Electronically Signed   By: Kimberley Penman M.D.   On: 12/16/2023 10:59

## 2024-01-06 NOTE — Assessment & Plan Note (Addendum)
 Recommend patient to continue slow Mag 1 tab daily.  Recommend IV Mag sulfate 2g x 1

## 2024-01-06 NOTE — Assessment & Plan Note (Addendum)
 locally advanced rectal cancer-Stage IIIB, s/p TNT neoadjuvant protocol, concurrent Xeloda  and radiation-Finished 08/27/2021, s/p  APR,rectal adenocarcinoma, [id number]. Positive radial margin due to perforation. Biopsy proven recurrent rectal cancer, locally advanced disease, possible colovesical fistula; no distant metastatic disease on PET- per [name] Surgeon Dr. Lenore Rafter, recurrence is not resectable. --> June 2024 S/p concurrent Xeloda  825 mg/m2 twice daily  with radiation --> July 2024 MRI pelvis w wo contrast showed persistent disease. Disease is not resectable per colorectal surgeon --> FOLFIRI Panitumumab  --> Dec 2025 partial response. --> per pt's preference switched to Xeloda  + Panitumumab . [Pt declined 5-FU pump] Labs are reviewed and discussed with patient.  Proceed with cycle 4 Xeloda  850 mg/m2 twice daily on days 1 to 14 of a 21-day cycle plus Panitumumab  Q3 weeks. [Panel study regimen].  He tolerates regimen well.  CT results were reviewed.  stable disease- mild treatment response.

## 2024-01-06 NOTE — Assessment & Plan Note (Signed)
 Continue calcium 1000mg  daily.  Check Vitamin D level

## 2024-01-06 NOTE — Patient Instructions (Signed)
 [facility]  Discharge Instructions: Thank you for choosing [facility] to provide your oncology and hematology care.  If you have a lab appointment with the Cancer Center, please go directly to the Cancer Center and check in at the registration area.  Wear comfortable clothing and clothing appropriate for easy access to any Portacath or PICC line.   We strive to give you quality time with your provider. You may need to reschedule your appointment if you arrive late (15 or more minutes).  Arriving late affects you and other patients whose appointments are after yours.  Also, if you miss three or more appointments without notifying the office, you may be dismissed from the clinic at the provider's discretion.      For prescription refill requests, have your pharmacy contact our office and allow 72 hours for refills to be completed.    Today you received the following chemotherapy and/or immunotherapy agents Vectibix.      To help prevent nausea and vomiting after your treatment, we encourage you to take your nausea medication as directed.  BELOW ARE SYMPTOMS THAT SHOULD BE REPORTED IMMEDIATELY: *FEVER GREATER THAN 100.4 F (38 C) OR HIGHER *CHILLS OR SWEATING *NAUSEA AND VOMITING THAT IS NOT CONTROLLED WITH YOUR NAUSEA MEDICATION *UNUSUAL SHORTNESS OF BREATH *UNUSUAL BRUISING OR BLEEDING *URINARY PROBLEMS (pain or burning when urinating, or frequent urination) *BOWEL PROBLEMS (unusual diarrhea, constipation, pain near the anus) TENDERNESS IN MOUTH AND THROAT WITH OR WITHOUT PRESENCE OF ULCERS (sore throat, sores in mouth, or a toothache) UNUSUAL RASH, SWELLING OR PAIN  UNUSUAL VAGINAL DISCHARGE OR ITCHING   Items with * indicate a potential emergency and should be followed up as soon as possible or go to the Emergency Department if any problems should occur.  Please show the CHEMOTHERAPY ALERT CARD or IMMUNOTHERAPY  ALERT CARD at check-in to the Emergency Department and triage nurse.  Should you have questions after your visit or need to cancel or reschedule your appointment, please contact [facility] - A DEPT OF [facility]  [phone] and follow the prompts.  Office hours are 8:00 a.m. to 4:30 p.m. Monday - Friday. Please note that voicemails left after 4:00 p.m. may not be returned until the following business day.  We are closed weekends and major holidays. You have access to a nurse at all times for urgent questions. Please call the main number to the clinic [phone] and follow the prompts.  For any non-urgent questions, you may also contact your provider using MyChart. We now offer e-Visits for anyone 49 and older to request care online for non-urgent symptoms. For details visit [email or web address].   Also download the MyChart app! Go to the app store, search "MyChart", open the app, select [place], and log in with your MyChart username and password.

## 2024-01-06 NOTE — Assessment & Plan Note (Signed)
 Continue  potassium to in AM and in PM.

## 2024-01-07 ENCOUNTER — Inpatient Hospital Stay: Attending: Oncology

## 2024-01-07 DIAGNOSIS — E876 Hypokalemia: Secondary | ICD-10-CM | POA: Insufficient documentation

## 2024-01-07 DIAGNOSIS — C20 Malignant neoplasm of rectum: Secondary | ICD-10-CM | POA: Insufficient documentation

## 2024-01-07 DIAGNOSIS — Z86711 Personal history of pulmonary embolism: Secondary | ICD-10-CM | POA: Insufficient documentation

## 2024-01-07 LAB — CEA: CEA: 2.4 ng/mL (ref 0.0–4.7)

## 2024-01-07 MED ORDER — MAGNESIUM SULFATE 2 GM/50ML IV SOLN
2.0000 g | Freq: Once | INTRAVENOUS | Status: AC
  Administered 2024-01-07: 2 g via INTRAVENOUS
  Filled 2024-01-07: qty 50

## 2024-01-07 MED ORDER — SODIUM CHLORIDE 0.9 % IV SOLN
INTRAVENOUS | Status: DC
  Filled 2024-01-07: qty 250

## 2024-01-07 MED ORDER — HEPARIN SOD (PORK) LOCK FLUSH 100 UNIT/ML IV SOLN
500.0000 [IU] | Freq: Once | INTRAVENOUS | Status: AC
  Administered 2024-01-07: 500 [IU] via INTRAVENOUS
  Filled 2024-01-07: qty 5

## 2024-01-07 NOTE — Patient Instructions (Signed)
 Magnesium Sulfate Injection What is this medication? MAGNESIUM SULFATE (mag NEE zee um SUL fate) prevents and treats low levels of magnesium in your body. It may also be used to prevent and treat seizures during pregnancy in people with high blood pressure disorders, such as preeclampsia or eclampsia. Magnesium plays an important role in maintaining the health of your muscles and nervous system. This medicine may be used for other purposes; ask your health care provider or pharmacist if you have questions. What should I tell my care team before I take this medication? They need to know if you have any of these conditions: Heart disease History of irregular heart beat Kidney disease An unusual or allergic reaction to magnesium sulfate, medications, foods, dyes, or preservatives Pregnant or trying to get pregnant Breast-feeding How should I use this medication? This medication is for infusion into a vein. It is given in a hospital or clinic setting. Talk to your care team about the use of this medication in children. While this medication may be prescribed for selected conditions, precautions do apply. Overdosage: If you think you have taken too much of this medicine contact a poison control center or emergency room at once. NOTE: This medicine is only for you. Do not share this medicine with others. What if I miss a dose? This does not apply. What may interact with this medication? Certain medications for anxiety or sleep Certain medications for seizures, such phenobarbital Digoxin Medications that relax muscles for surgery Narcotic medications for pain This list may not describe all possible interactions. Give your health care provider a list of all the medicines, herbs, non-prescription drugs, or dietary supplements you use. Also tell them if you smoke, drink alcohol, or use illegal drugs. Some items may interact with your medicine. What should I watch for while using this  medication? Your condition will be monitored carefully while you are receiving this medication. You may need blood work done while you are receiving this medication. What side effects may I notice from receiving this medication? Side effects that you should report to your care team as soon as possible: Allergic reactions--skin rash, itching, hives, swelling of the face, lips, tongue, or throat High magnesium level--confusion, drowsiness, facial flushing, redness, sweating, muscle weakness, fast or irregular heartbeat, trouble breathing Low blood pressure--dizziness, feeling faint or lightheaded, blurry vision Side effects that usually do not require medical attention (report to your care team if they continue or are bothersome): Headache Nausea This list may not describe all possible side effects. Call your doctor for medical advice about side effects. You may report side effects to FDA at 1-800-FDA-1088. Where should I keep my medication? This medication is given in a hospital or clinic and will not be stored at home. NOTE: This sheet is a summary. It may not cover all possible information. If you have questions about this medicine, talk to your doctor, pharmacist, or health care provider.  2024 Elsevier/Gold Standard (2021-05-08 00:00:00)

## 2024-01-18 ENCOUNTER — Other Ambulatory Visit: Payer: Self-pay

## 2024-01-24 IMAGING — CT CT ANGIO CHEST
2 of 7 series · 17 of 46 positions shown · IV contrast (APPLIED)
Comparison: Chest x-ray 02/25/2022, CT 01/27/2022, 12/08/2021, MRI
03/07/2021, chest CT 09/04/2021, PET CT 02/13/2021

CLINICAL DATA: Sacral infection abdomen and back pain



[Series 5: thins · axial · 0.74mm/px · z∈[-522,-241]mm · 14 of 317 slices shown]
[im 18/317  lung]
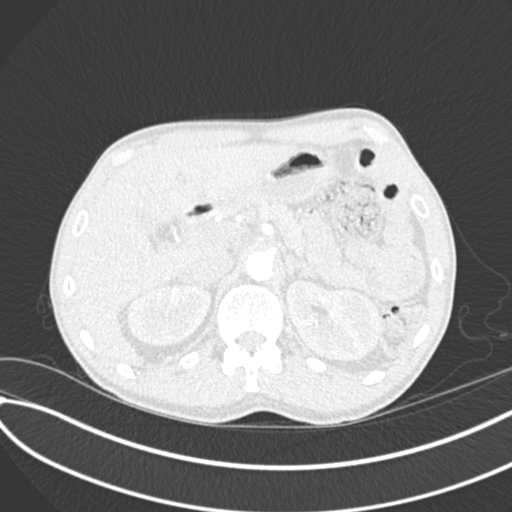
[im 36/317  soft-tissue]
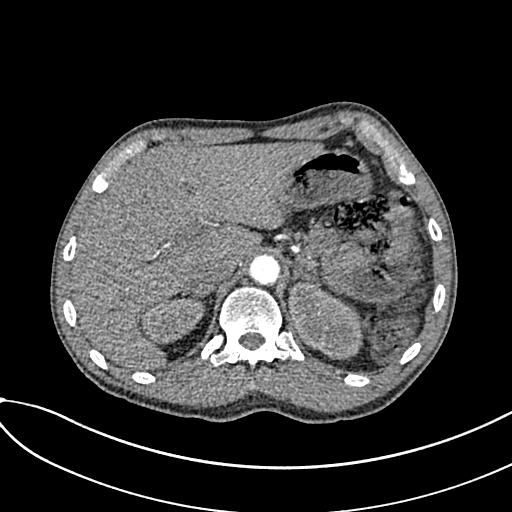
[im 71/317  lung]
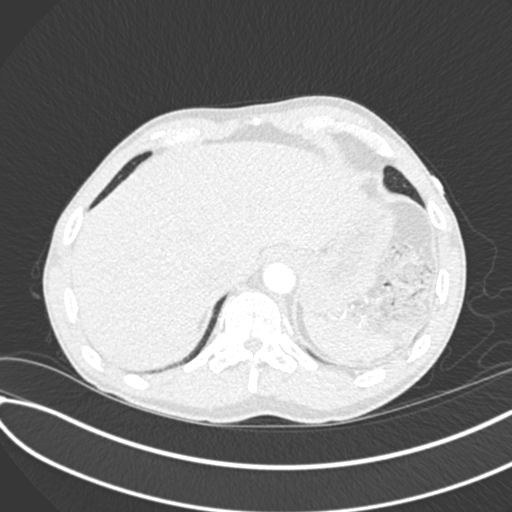
[im 88/317  soft-tissue]
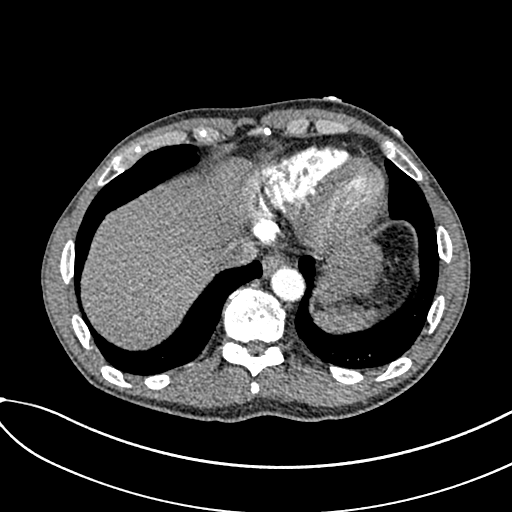
[im 106/317  lung]
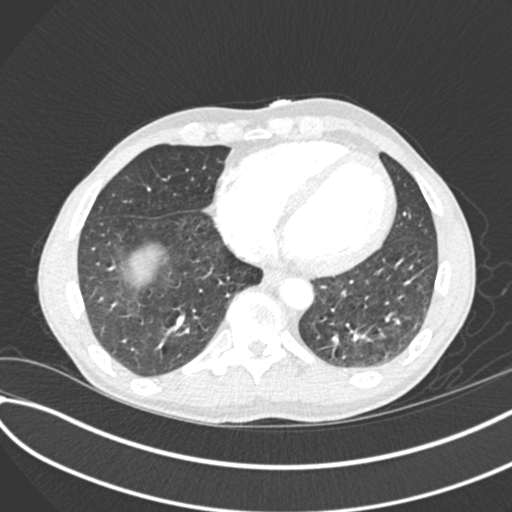
[im 123/317  soft-tissue]
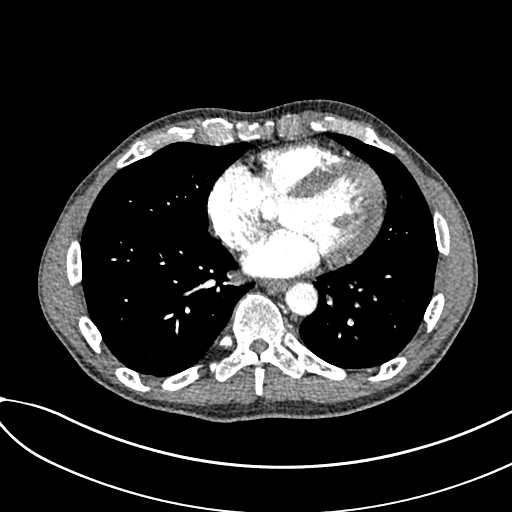
[im 141/317  lung]
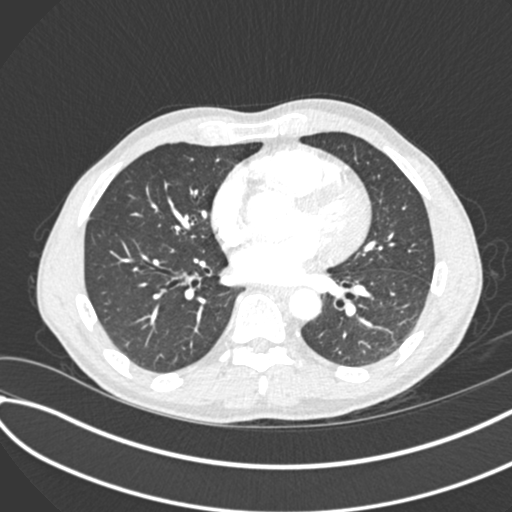
[im 176/317  soft-tissue]
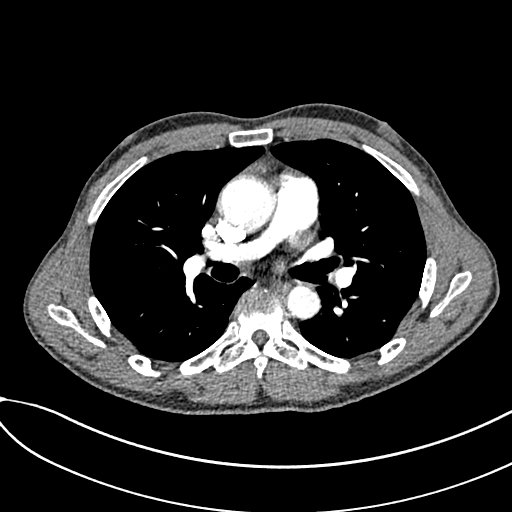
[im 194/317  lung]
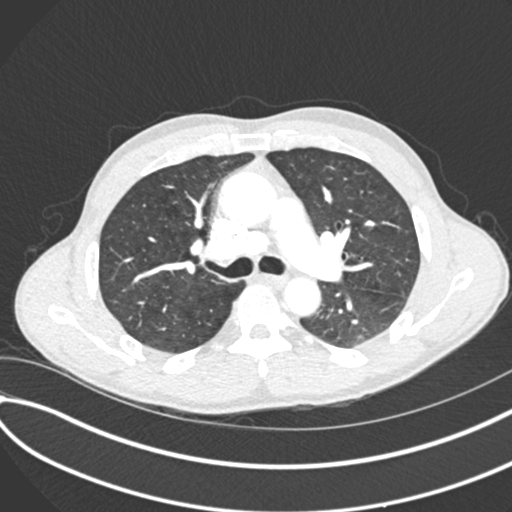
[im 211/317  soft-tissue]
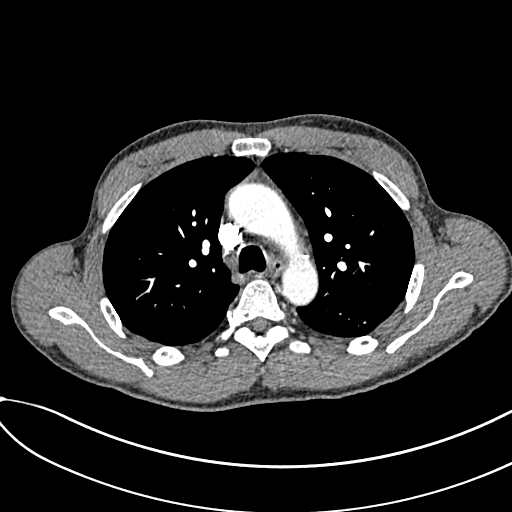
[im 229/317  lung]
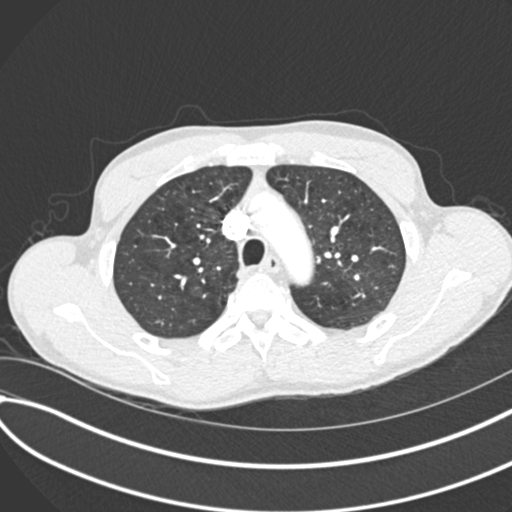
[im 246/317  soft-tissue]
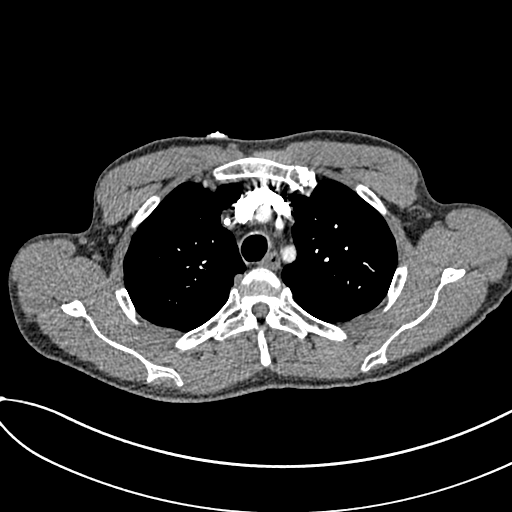
[im 281/317  lung]
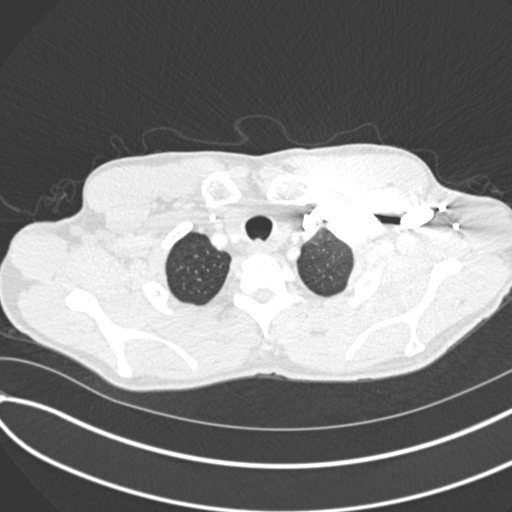
[im 299/317  soft-tissue]
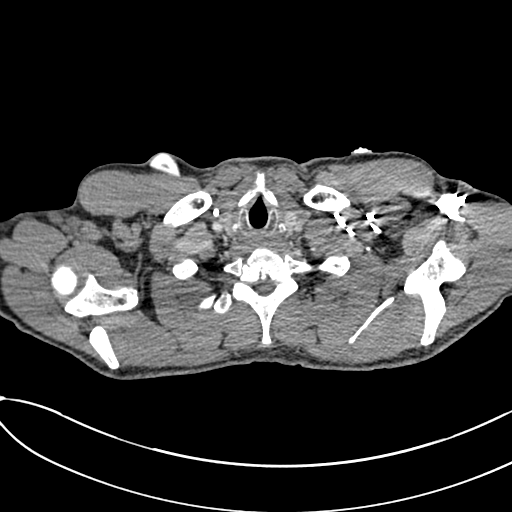

[Series 7: coronal mpr · coronal · 0.66mm/px · 3 of 126 slices shown]
[im 32/126  soft-tissue]
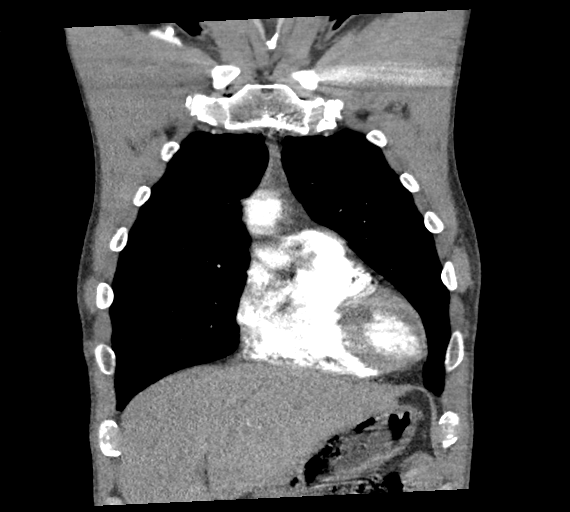
[im 63/126  soft-tissue]
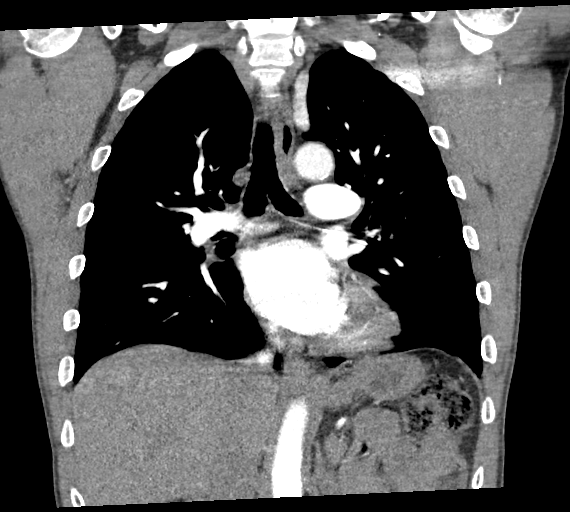
[im 94/126  soft-tissue]
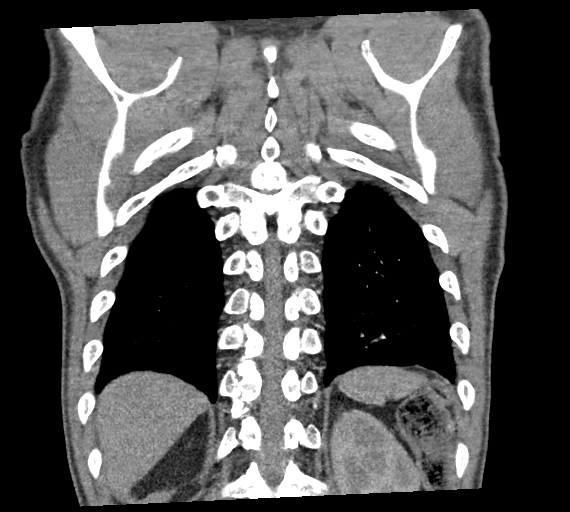

[17 of 46 positions shown; findings below may reference images not displayed]

RADIATION DOSE REDUCTION: This exam was performed according to the
departmental dose-optimization program which includes automated
exposure control, adjustment of the mA and/or kV according to
patient size and/or use of iterative reconstruction technique.

CONTRAST:  80mL OMNIPAQUE IOHEXOL 350 MG/ML SOLN
FINDINGS: CTA CHEST FINDINGS

Cardiovascular: Satisfactory opacification of the pulmonary arteries
to the segmental level. No evidence of pulmonary embolism. Normal
heart size. No pericardial effusion. Nonaneurysmal aorta. No
dissection seen. Mild atherosclerosis.

Mediastinum/Nodes: Midline trachea. No thyroid mass. No suspicious
lymph nodes. Esophagus within normal limits.

Lungs/Pleura: Mild emphysema. No acute airspace disease. Further
decrease in size of left upper lobe irregular nodule, measuring 2 mm
on series 6, image 34, previously 4 mm. Small left Peri fissural
nodules are unchanged, series 6, image 55. There is no new pulmonary
lesion identified.

Musculoskeletal: No acute osseous abnormality.

Review of the MIP images confirms the above findings.

CT ABDOMEN and PELVIS FINDINGS

Hepatobiliary: No calcified gallstone. Stable inferior right hepatic
exophytic cyst. No biliary dilatation

Pancreas: Unremarkable. No pancreatic ductal dilatation or
surrounding inflammatory changes.

Spleen: Normal in size without focal abnormality.

Adrenals/Urinary Tract: Adrenal glands are normal. Kidneys show no
hydronephrosis. Subcentimeter hypodensities in the kidneys too small
to further characterize. The bladder is grossly unremarkable.

Stomach/Bowel: The stomach is nonenlarged. No dilated small bowel.
History of abdominal perineal resection with left lower quadrant
colostomy. Slightly thickened appearance of distal small bowel and
terminal ileum in the pelvis, series 2 image 58 with mild stranding
in the pelvic fat.

Vascular/Lymphatic: Moderate aortic atherosclerosis. No aneurysm.
Right pelvic sidewall lymph node measures 2.7 cm transverse,
previously 2.7 cm.

Reproductive: Negative for mass

Other: No free air. Interval decrease in size of previously noted
rim enhancing fluid collection in the perineal region that was noted
on the previous exam with tiny residual collection measuring 1.7 by
0.8 cm, series 2 image 87, this is contiguous with rim enhancing gas
and fluid collection that extends into the pelvis which also appears
slightly diminished in size as compared with [REDACTED] exam. On
sagittal images, the collection measures approximately 8.1 cm
oblique craniocaudad by 2.7 cm AP

Musculoskeletal: No acute osseous abnormality.

Review of the MIP images confirms the above findings.
IMPRESSION: 1. Negative for acute pulmonary embolus.
2. Emphysema. Further decrease in size of the previously noted
irregular nodule in the left upper lobe which is now barely
measurable today. No new suspicious lung nodules
3. Status post left lower quadrant colostomy. Further decrease in
size since comparison exam from [REDACTED] the rim enhancing gas and
fluid collection at the pelvic surgical bed extending from the
perineum superiorly into the pelvis
4. Slightly thickened appearance of terminal ileal small bowel loops
in the pelvis with mild stranding suggesting small bowel
inflammatory process.
5. Stable enlarged right pelvic sidewall lymph node

## 2024-01-24 IMAGING — CR DG CHEST 2V
2 series · 2 of 2 positions shown · non-contrast
Comparison: CT from 09/04/2021

CLINICAL DATA: Chest pain on inspiration

EXAM:
CHEST - 2 VIEW

[chest pa]
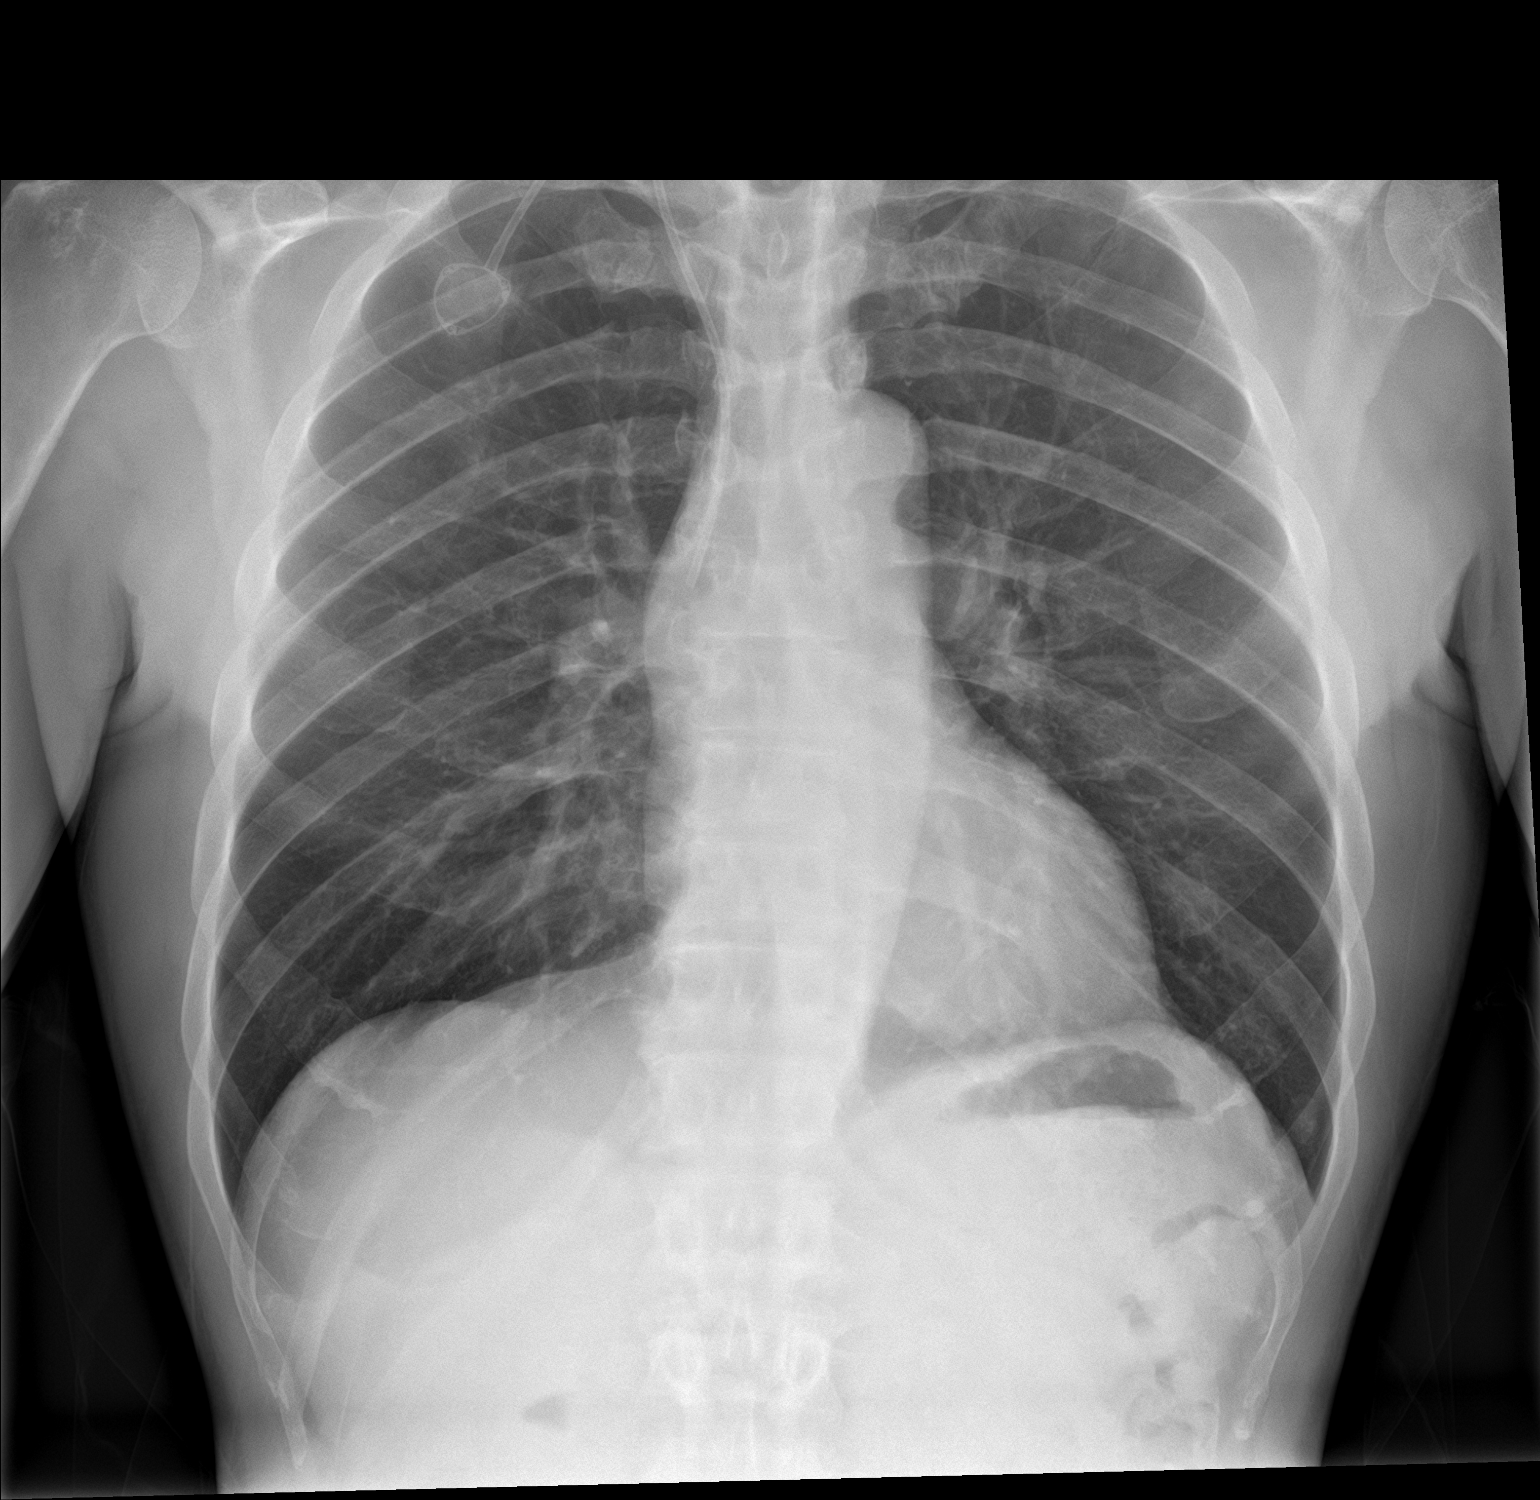

[chest lat]
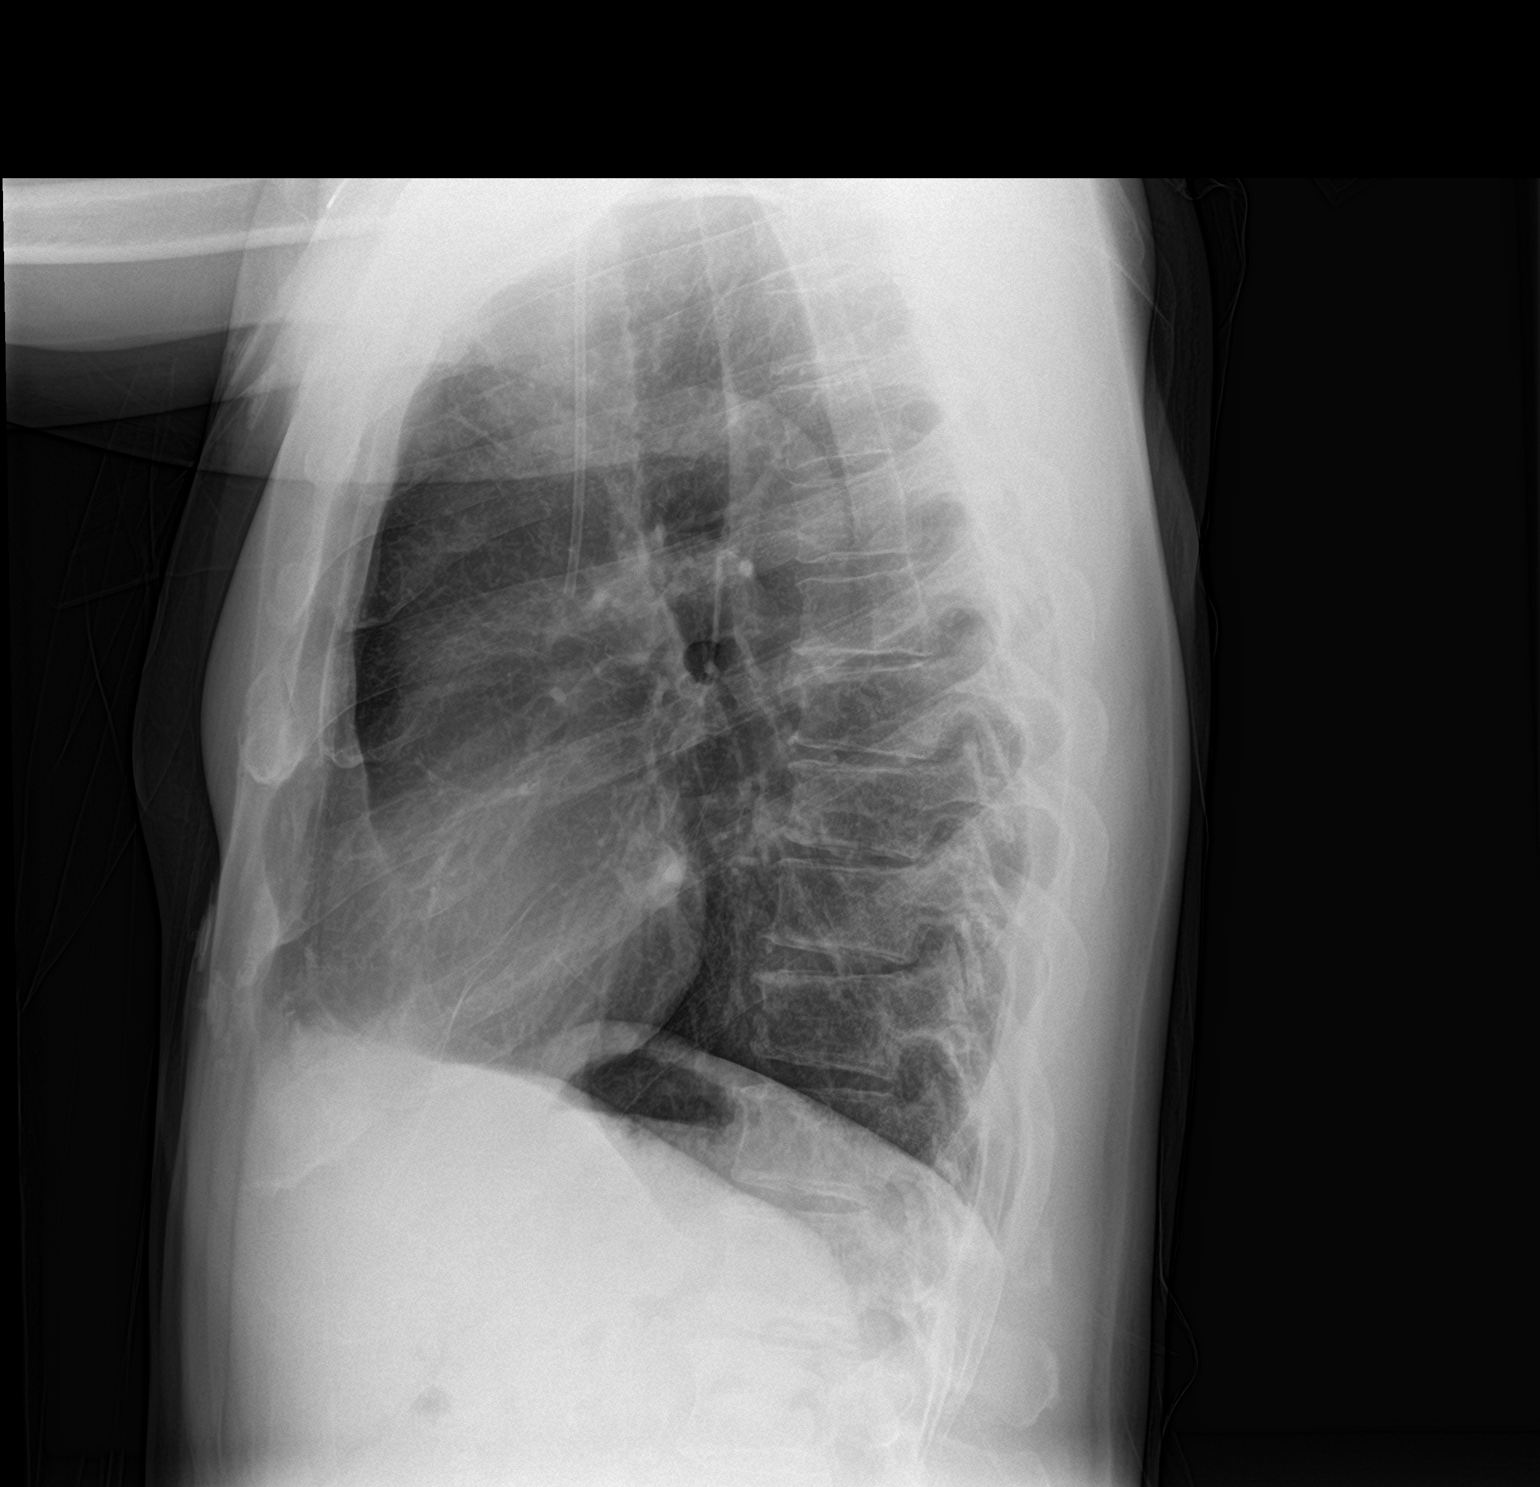

[2 of 2 positions shown; findings below may reference images not displayed]

FINDINGS: Cardiac shadow is within normal limits. Right chest wall port is
seen. Lungs are clear bilaterally. Known left upper lobe nodule is
not well appreciated on this exam. No acute bony abnormality is
noted.
IMPRESSION: No acute abnormality seen.

## 2024-01-24 IMAGING — CT CT ABD-PELV W/ CM
2 of 5 series · 13 of 46 positions shown, 15 images · IV contrast (APPLIED)
Comparison: Chest x-ray 02/25/2022, CT 01/27/2022, 12/08/2021, MRI
03/07/2021, chest CT 09/04/2021, PET CT 02/13/2021

CLINICAL DATA: Sacral infection abdomen and back pain



[Series 2: axial st · axial · 0.72mm/px · z∈[-872,-456]mm · 10 of 95 slices shown, 12 images]
[im 6/95  soft-tissue]
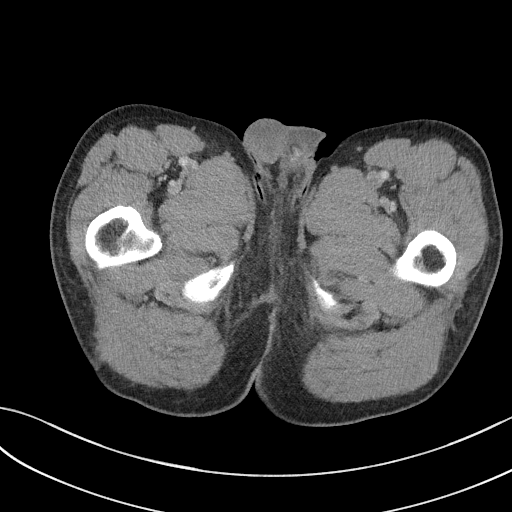
[im 6/95  bone]
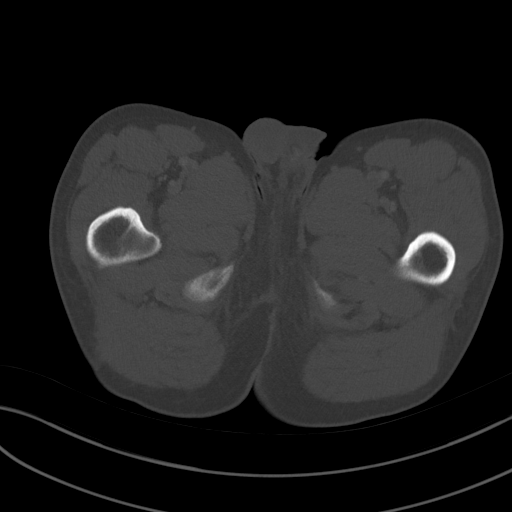
[im 16/95  soft-tissue]
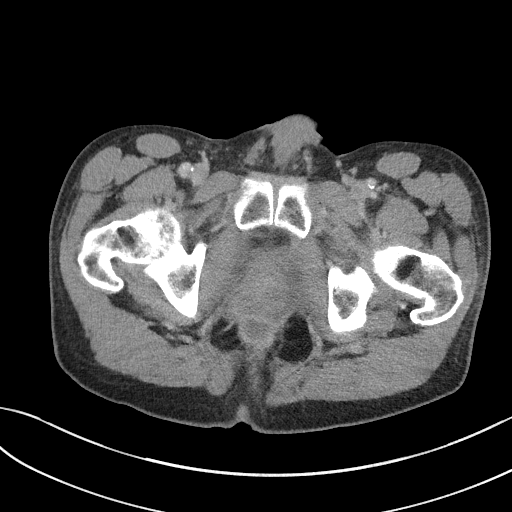
[im 27/95  soft-tissue]
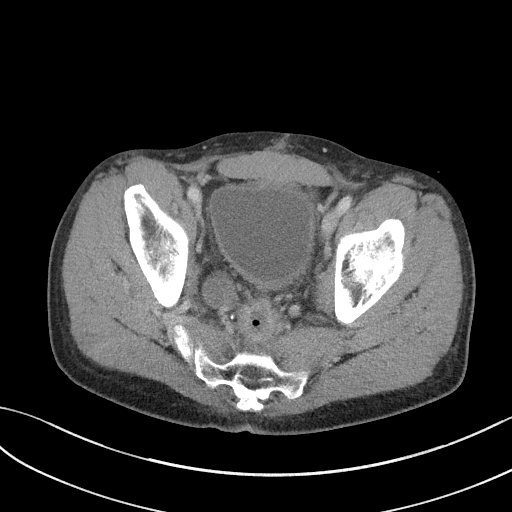
[im 32/95  soft-tissue]
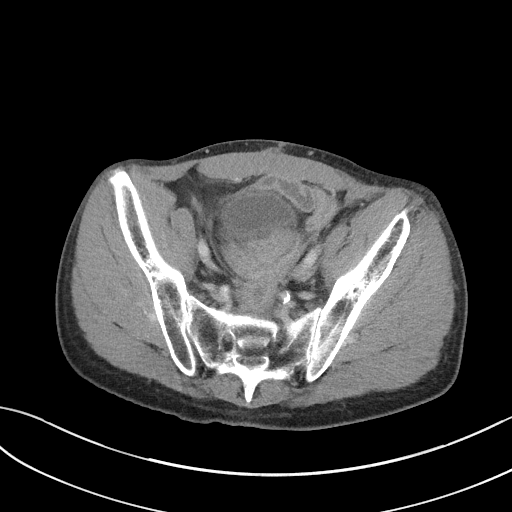
[im 42/95  soft-tissue]
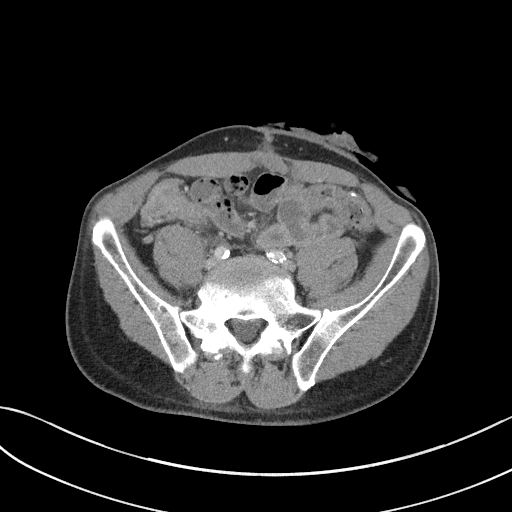
[im 53/95  soft-tissue]
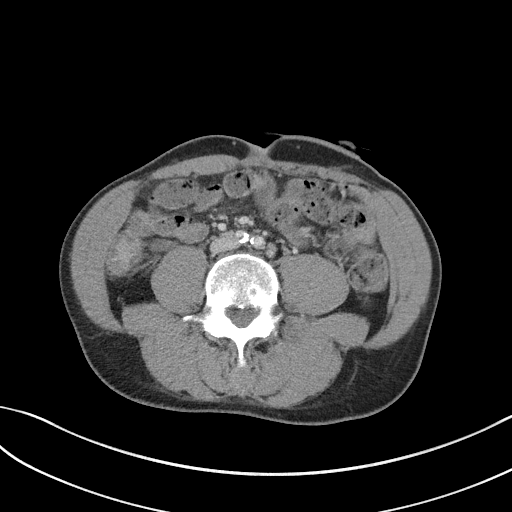
[im 63/95  soft-tissue]
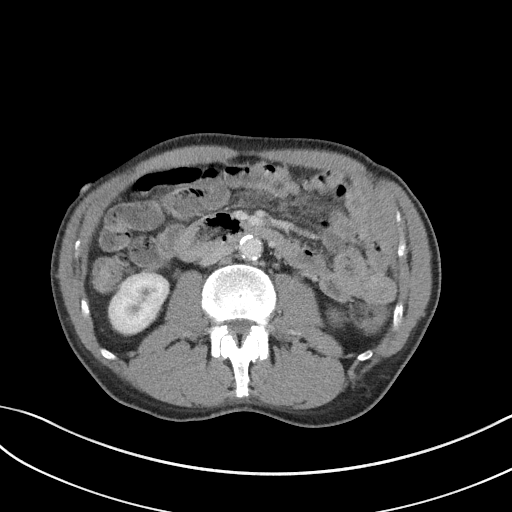
[im 68/95  soft-tissue]
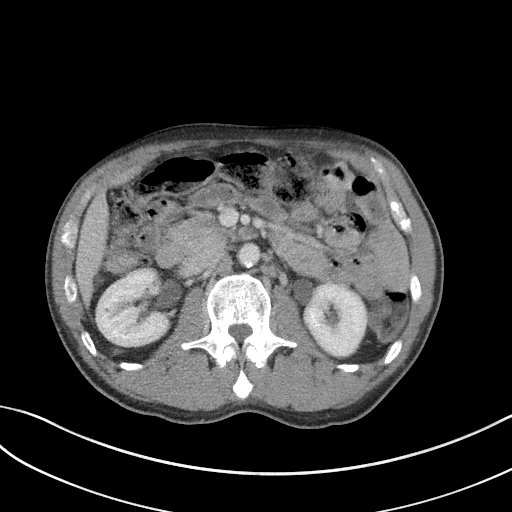
[im 79/95  soft-tissue]
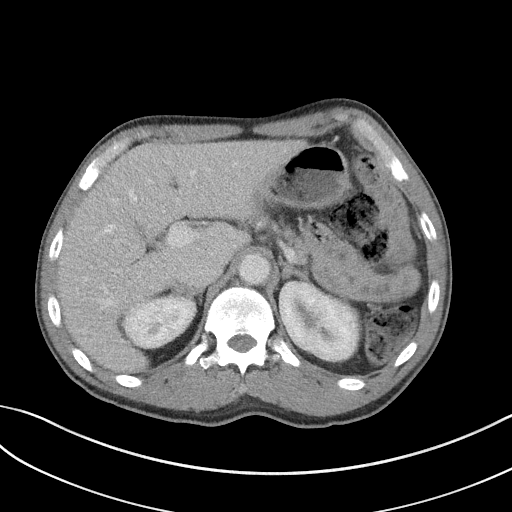
[im 79/95  bone]
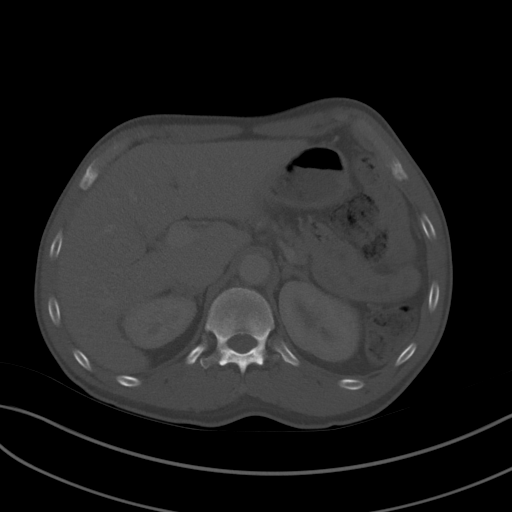
[im 89/95  soft-tissue]
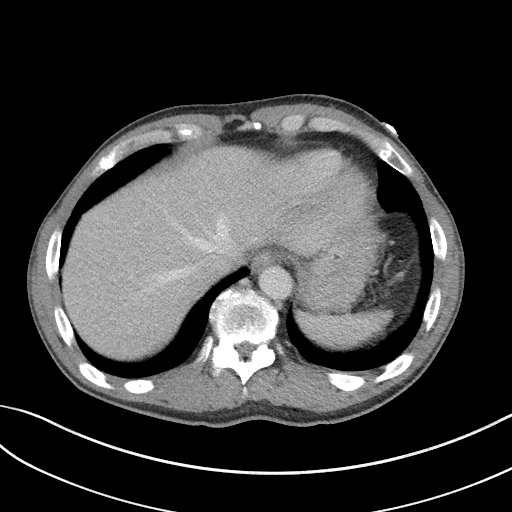

[Series 5: coronal st · coronal · 0.65mm/px · 3 of 85 slices shown]
[im 29/85  soft-tissue]
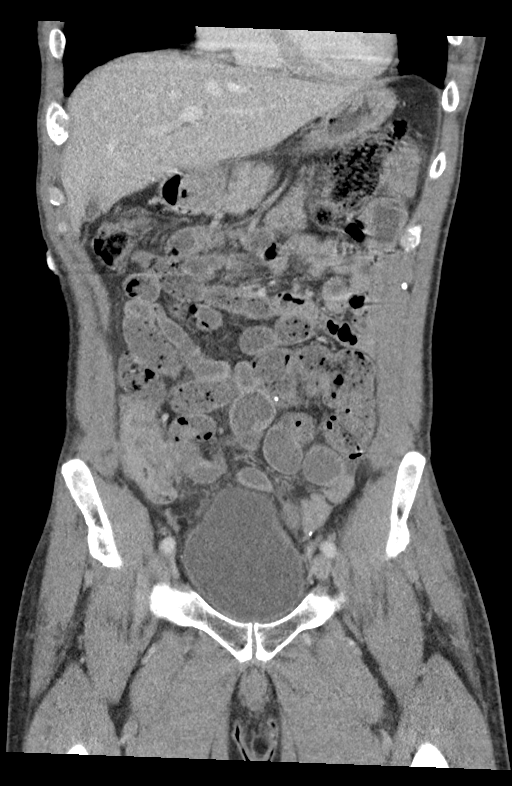
[im 38/85  soft-tissue]
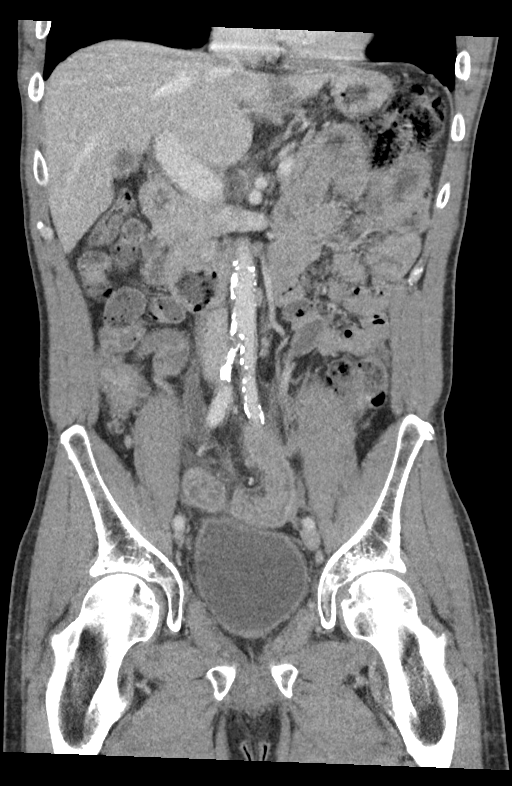
[im 47/85  soft-tissue]
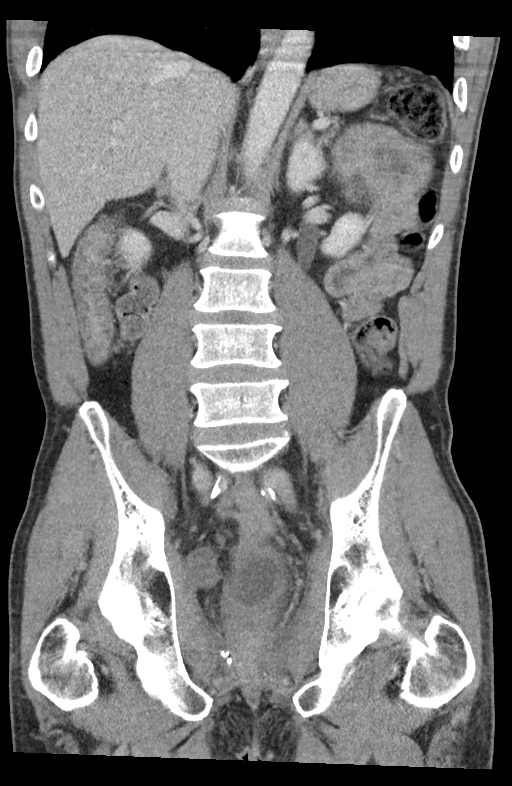

[13 of 46 positions shown; findings below may reference images not displayed]

RADIATION DOSE REDUCTION: This exam was performed according to the
departmental dose-optimization program which includes automated
exposure control, adjustment of the mA and/or kV according to
patient size and/or use of iterative reconstruction technique.

CONTRAST:  80mL OMNIPAQUE IOHEXOL 350 MG/ML SOLN
FINDINGS: CTA CHEST FINDINGS

Cardiovascular: Satisfactory opacification of the pulmonary arteries
to the segmental level. No evidence of pulmonary embolism. Normal
heart size. No pericardial effusion. Nonaneurysmal aorta. No
dissection seen. Mild atherosclerosis.

Mediastinum/Nodes: Midline trachea. No thyroid mass. No suspicious
lymph nodes. Esophagus within normal limits.

Lungs/Pleura: Mild emphysema. No acute airspace disease. Further
decrease in size of left upper lobe irregular nodule, measuring 2 mm
on series 6, image 34, previously 4 mm. Small left Peri fissural
nodules are unchanged, series 6, image 55. There is no new pulmonary
lesion identified.

Musculoskeletal: No acute osseous abnormality.

Review of the MIP images confirms the above findings.

CT ABDOMEN and PELVIS FINDINGS

Hepatobiliary: No calcified gallstone. Stable inferior right hepatic
exophytic cyst. No biliary dilatation

Pancreas: Unremarkable. No pancreatic ductal dilatation or
surrounding inflammatory changes.

Spleen: Normal in size without focal abnormality.

Adrenals/Urinary Tract: Adrenal glands are normal. Kidneys show no
hydronephrosis. Subcentimeter hypodensities in the kidneys too small
to further characterize. The bladder is grossly unremarkable.

Stomach/Bowel: The stomach is nonenlarged. No dilated small bowel.
History of abdominal perineal resection with left lower quadrant
colostomy. Slightly thickened appearance of distal small bowel and
terminal ileum in the pelvis, series 2 image 58 with mild stranding
in the pelvic fat.

Vascular/Lymphatic: Moderate aortic atherosclerosis. No aneurysm.
Right pelvic sidewall lymph node measures 2.7 cm transverse,
previously 2.7 cm.

Reproductive: Negative for mass

Other: No free air. Interval decrease in size of previously noted
rim enhancing fluid collection in the perineal region that was noted
on the previous exam with tiny residual collection measuring 1.7 by
0.8 cm, series 2 image 87, this is contiguous with rim enhancing gas
and fluid collection that extends into the pelvis which also appears
slightly diminished in size as compared with [REDACTED] exam. On
sagittal images, the collection measures approximately 8.1 cm
oblique craniocaudad by 2.7 cm AP

Musculoskeletal: No acute osseous abnormality.

Review of the MIP images confirms the above findings.
IMPRESSION: 1. Negative for acute pulmonary embolus.
2. Emphysema. Further decrease in size of the previously noted
irregular nodule in the left upper lobe which is now barely
measurable today. No new suspicious lung nodules
3. Status post left lower quadrant colostomy. Further decrease in
size since comparison exam from [REDACTED] the rim enhancing gas and
fluid collection at the pelvic surgical bed extending from the
perineum superiorly into the pelvis
4. Slightly thickened appearance of terminal ileal small bowel loops
in the pelvis with mild stranding suggesting small bowel
inflammatory process.
5. Stable enlarged right pelvic sidewall lymph node

## 2024-01-27 ENCOUNTER — Inpatient Hospital Stay

## 2024-01-27 ENCOUNTER — Encounter: Payer: Self-pay | Admitting: Oncology

## 2024-01-27 ENCOUNTER — Other Ambulatory Visit: Payer: Self-pay | Admitting: Oncology

## 2024-01-27 ENCOUNTER — Inpatient Hospital Stay: Admitting: Oncology

## 2024-01-27 DIAGNOSIS — C20 Malignant neoplasm of rectum: Secondary | ICD-10-CM

## 2024-02-04 ENCOUNTER — Other Ambulatory Visit: Payer: Self-pay | Admitting: Oncology

## 2024-02-04 ENCOUNTER — Inpatient Hospital Stay

## 2024-02-04 ENCOUNTER — Inpatient Hospital Stay: Admitting: Oncology

## 2024-02-07 ENCOUNTER — Encounter: Payer: Self-pay | Admitting: Oncology

## 2024-02-11 ENCOUNTER — Inpatient Hospital Stay: Payer: Self-pay | Attending: Oncology

## 2024-02-11 ENCOUNTER — Inpatient Hospital Stay: Payer: Self-pay

## 2024-02-11 ENCOUNTER — Encounter: Payer: Self-pay | Admitting: Oncology

## 2024-02-11 ENCOUNTER — Inpatient Hospital Stay (HOSPITAL_BASED_OUTPATIENT_CLINIC_OR_DEPARTMENT_OTHER): Payer: Self-pay | Admitting: Oncology

## 2024-02-11 VITALS — BP 131/95 | HR 83 | Temp 97.9°F | Resp 18 | Wt 186.6 lb

## 2024-02-11 DIAGNOSIS — Z87891 Personal history of nicotine dependence: Secondary | ICD-10-CM | POA: Insufficient documentation

## 2024-02-11 DIAGNOSIS — E876 Hypokalemia: Secondary | ICD-10-CM | POA: Insufficient documentation

## 2024-02-11 DIAGNOSIS — Z79899 Other long term (current) drug therapy: Secondary | ICD-10-CM | POA: Insufficient documentation

## 2024-02-11 DIAGNOSIS — Z86711 Personal history of pulmonary embolism: Secondary | ICD-10-CM | POA: Insufficient documentation

## 2024-02-11 DIAGNOSIS — C20 Malignant neoplasm of rectum: Secondary | ICD-10-CM | POA: Diagnosis not present

## 2024-02-11 DIAGNOSIS — Z5111 Encounter for antineoplastic chemotherapy: Secondary | ICD-10-CM

## 2024-02-11 DIAGNOSIS — I10 Essential (primary) hypertension: Secondary | ICD-10-CM | POA: Diagnosis not present

## 2024-02-11 DIAGNOSIS — Z5112 Encounter for antineoplastic immunotherapy: Secondary | ICD-10-CM | POA: Diagnosis present

## 2024-02-11 DIAGNOSIS — Z7901 Long term (current) use of anticoagulants: Secondary | ICD-10-CM | POA: Insufficient documentation

## 2024-02-11 LAB — CBC WITH DIFFERENTIAL (CANCER CENTER ONLY)
Abs Immature Granulocytes: 0.03 10*3/uL (ref 0.00–0.07)
Basophils Absolute: 0 10*3/uL (ref 0.0–0.1)
Basophils Relative: 0 %
Eosinophils Absolute: 0.1 10*3/uL (ref 0.0–0.5)
Eosinophils Relative: 2 %
HCT: 41.4 % (ref 39.0–52.0)
Hemoglobin: 14.6 g/dL (ref 13.0–17.0)
Immature Granulocytes: 1 %
Lymphocytes Relative: 18 %
Lymphs Abs: 1 10*3/uL (ref 0.7–4.0)
MCH: 32.2 pg (ref 26.0–34.0)
MCHC: 35.3 g/dL (ref 30.0–36.0)
MCV: 91.2 fL (ref 80.0–100.0)
Monocytes Absolute: 0.7 10*3/uL (ref 0.1–1.0)
Monocytes Relative: 12 %
Neutro Abs: 3.7 10*3/uL (ref 1.7–7.7)
Neutrophils Relative %: 67 %
Platelet Count: 204 10*3/uL (ref 150–400)
RBC: 4.54 MIL/uL (ref 4.22–5.81)
RDW: 14.2 % (ref 11.5–15.5)
WBC Count: 5.6 10*3/uL (ref 4.0–10.5)
nRBC: 0 % (ref 0.0–0.2)

## 2024-02-11 LAB — CMP (CANCER CENTER ONLY)
ALT: 17 U/L (ref 0–44)
AST: 18 U/L (ref 15–41)
Albumin: 3.4 g/dL — ABNORMAL LOW (ref 3.5–5.0)
Alkaline Phosphatase: 93 U/L (ref 38–126)
Anion gap: 7 (ref 5–15)
BUN: 9 mg/dL (ref 6–20)
CO2: 24 mmol/L (ref 22–32)
Calcium: 8.1 mg/dL — ABNORMAL LOW (ref 8.9–10.3)
Chloride: 107 mmol/L (ref 98–111)
Creatinine: 0.74 mg/dL (ref 0.61–1.24)
GFR, Estimated: >60 mL/min (ref >60)
Glucose, Bld: 141 mg/dL — ABNORMAL HIGH (ref 70–99)
Potassium: 3.4 mmol/L — ABNORMAL LOW (ref 3.5–5.1)
Sodium: 138 mmol/L (ref 135–145)
Total Bilirubin: 0.6 mg/dL (ref 0.0–1.2)
Total Protein: 6.8 g/dL (ref 6.5–8.1)

## 2024-02-11 LAB — MAGNESIUM: Magnesium: 1.7 mg/dL (ref 1.7–2.4)

## 2024-02-11 MED ORDER — RIVAROXABAN 10 MG PO TABS: 10.0000 mg | ORAL_TABLET | Freq: Every day | ORAL | 5 refills | Status: AC

## 2024-02-11 MED ORDER — POTASSIUM CHLORIDE CRYS ER 20 MEQ PO TBCR: 20.0000 meq | EXTENDED_RELEASE_TABLET | ORAL | 2 refills | Status: DC

## 2024-02-11 MED ORDER — HEPARIN SOD (PORK) LOCK FLUSH 100 UNIT/ML IV SOLN
500.0000 [IU] | Freq: Once | INTRAVENOUS | Status: AC | PRN
  Administered 2024-02-11: 500 [IU]
  Filled 2024-02-11: qty 5

## 2024-02-11 MED ORDER — SODIUM CHLORIDE 0.9 % IV SOLN
INTRAVENOUS | Status: DC
  Filled 2024-02-11: qty 250

## 2024-02-11 MED ORDER — SODIUM CHLORIDE 0.9 % IV SOLN
9.0000 mg/kg | Freq: Once | INTRAVENOUS | Status: AC
  Administered 2024-02-11: 700 mg via INTRAVENOUS
  Filled 2024-02-11: qty 15

## 2024-02-11 NOTE — Assessment & Plan Note (Signed)
 Continue calcium 1000mg  daily.  Check Vitamin D level

## 2024-02-11 NOTE — Assessment & Plan Note (Signed)
 continue slow Mag 1 tab daily.

## 2024-02-11 NOTE — Assessment & Plan Note (Signed)
 Chemotherapy plan as listed

## 2024-02-11 NOTE — Assessment & Plan Note (Addendum)
 locally advanced rectal cancer-Stage IIIB, s/p TNT neoadjuvant protocol, concurrent Xeloda  and radiation-Finished 08/27/2021, s/p  APR,rectal adenocarcinoma, ypT3N0. Positive radial margin due to perforation. Biopsy proven recurrent rectal cancer, locally advanced disease, possible colovesical fistula; no distant metastatic disease on PET- per [name] Surgeon Dr. Lenore Rafter, recurrence is not resectable. --> June 2024 S/p concurrent Xeloda  825 mg/m2 twice daily  with radiation --> July 2024 MRI pelvis w wo contrast showed persistent disease. Disease is not resectable per colorectal surgeon --> FOLFIRI Panitumumab  --> Dec 2025 partial response. --> per pt's preference switched to Xeloda  + Panitumumab . [Pt declined 5-FU pump] Labs are reviewed and discussed with patient.  Proceed with cycle 5 Xeloda  850 mg/m2 twice daily on days 1 to 14 of a 21-day cycle plus Panitumumab  Q3 weeks. [Panel study regimen].  He tolerates regimen well.  Repeat CT in July

## 2024-02-11 NOTE — Assessment & Plan Note (Signed)
 Continue  potassium to in AM and in PM.

## 2024-02-11 NOTE — Assessment & Plan Note (Signed)
 Continue  losartan  25mg  daily. Recommend patient to monitor BP at home.  Follow up with PCP

## 2024-02-11 NOTE — Assessment & Plan Note (Addendum)
 December 2024 right lower lobe segmental and lobar pulmonary embolism Decrease Xarelto  to 10mg  daily.

## 2024-02-11 NOTE — Progress Notes (Signed)
 Hematology/Oncology Progress note [phone] [phone]      CHIEF COMPLAINTS/REASON FOR VISIT:  Follow up for recurrent rectal cancer treatment  ASSESSMENT & PLAN:   Cancer Staging  Rectal cancer [facility]) Staging form: Colon and Rectum, AJCC 8th Edition - Clinical stage from 02/08/2021: Stage IIIB (cT4a, cN1, cM0) - Signed by [name], MD on 03/06/2021   Rectal cancer [facility]) locally advanced rectal cancer-Stage IIIB, s/p TNT neoadjuvant protocol, concurrent Xeloda  and radiation-Finished 08/27/2021, s/p  APR,rectal adenocarcinoma, ypT3N0. Positive radial margin due to perforation. Biopsy proven recurrent rectal cancer, locally advanced disease, possible colovesical fistula; no distant metastatic disease on PET- per [name] Surgeon Dr. Lenore Rafter, recurrence is not resectable. --> June 2024 S/p concurrent Xeloda  825 mg/m2 twice daily  with radiation --> July 2024 MRI pelvis w wo contrast showed persistent disease. Disease is not resectable per colorectal surgeon --> FOLFIRI Panitumumab  --> Dec 2025 partial response. --> per pt's preference switched to Xeloda  + Panitumumab . [Pt declined 5-FU pump] Labs are reviewed and discussed with patient.  Proceed with cycle 5 Xeloda  850 mg/m2 twice daily on days 1 to 14 of a 21-day cycle plus Panitumumab  Q3 weeks. [Panel study regimen].  He tolerates regimen well.  Repeat CT in July  History of pulmonary embolism December 2024 right lower lobe segmental and lobar pulmonary embolism Decrease Xarelto  to 10mg  daily.  Encounter for antineoplastic chemotherapy Chemotherapy plan as listed  Hypertension Continue  losartan  25mg  daily. Recommend patient to monitor BP at home.  Follow up with PCP  Hypocalcemia Continue calcium  1000mg  daily.  Check Vitamin D level  Hypokalemia Continue  potassium to 40meq in AM and 20meq in PM.    Hypomagnesemia continue slow Mag 1 tab daily.        No orders of the defined types were placed in  this encounter.   Follow-up  3 weeks All questions were answered. The patient knows to call the clinic with any problems, questions or concerns.  [name], MD, PhD [facility] Hematology Oncology 02/11/2024     HISTORY OF PRESENTING ILLNESS:   Barry [name]. is a  58 y.o.  male presents for rectal cancer Oncology History  Rectal cancer (HCC)  02/08/2021 Initial Diagnosis   Rectal cancer   02/05/2021-02/06/2021 patient was hospitalized due to generalized weakness, intermittent lightheadedness, weight loss and worsening constipation.  02/05/2021 CT abdomen showed concerning of severe rectal wall thickening and right internal iliac lymph node concerning for metastatic disease.  Patient was seen by gastroenterology and had colonoscopy which showed a circumferential fungating mass in the rectum.  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   02/14/2021-02/16/2021 hospitalized due to rectal bleeding and pain.   02/14/2021 CT showed perirectal fluid collection/gas concerning for infection with significant leukocytosis, anemia with hemoglobin of 6.8.  Patient received PRBC transfusion, IV antibiotics.  He underwent an IR guided placement of JP drain into the rectal abscess.  Discharged home with oral Augmentin . 02/20/2021 presented to ER with new skin opening and draining from left gluteus.  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  JP drain has been removed.  Patient was continued on Augmentin .   02/13/2021, PET scan showed locally advanced rectal cancer with billowing of the mesorectum now with low attenuation material that was not present on previous examination with extensive stranding and inflammation.   Bulky RIGHT pelvic sidewall/hypogastric lymph node outside of the mesorectum with stippled calcification measuring 19 mm-no increased metabolic activity. Small LEFT hypogastric lymph node just peripheral to  the internal external bifurcation-SUV 3.2 High RIGHT internal just  below or at the internal/common iliac transition, lymph node -SUV 4.8 LEFT high hypogastric lymph node  8 mm-SUV 3. Scattered lymph nodes throughout the retroperitoneum with low FDG uptake Bilateral inguinal lymph nodes largest on the RIGHT (image 248/3) 11 mm with a maximum SUV of 2.8 spiculated nodule in the LEFT upper lobe- 9 x 8 mm       02/14/2021, CT abdomen pelvis without contrast Showed perforated rectal mass with contained perforation with fluid and gas extending above and below the pelvic floor,potentially involving the sphincter complex and extending into LEFT ischial rectal fossa   02/20/2021, CT pelvis with contrast showed Large perirectal/perianal abscess has markedly decreased in size since placement of the percutaneous drain. There are residual gas-filled collections in the soft tissues and suspect there is a fistula or sinus tract between the rectal mass and the subcutaneous tissues. Soft tissue gas along the medial left buttock and concern for a cutaneous ulceration in this area. Large rectal mass with evidence for a large necrotic right pelvic lymph node.   His case is complicated with a perirectal abscess. Prior to onset of perirectal abscess, on his 02/05/2021 scan, he was noted to have right internal iliac lymph node 3 x 1 x 2.3 cm which is concerning for nodal disease.On Subsequent images it was difficult to distinguish whether lymphadenopathy was due to nodal disease versus acute inflammation. 03/07/2021 MRI pelvis cT3 N2. Due to the possible contained perforation on previous CT, possible cT4 disease.    02/27/2021 medi port placed by Dr.Dew 03/13/2021 reports right butt cheek and also left perianal area fullness.he was seen by Dr.Pabon urgently  and had right buttock abscess drained. Left perianal fullness was felt to be due to cancer.    02/08/2021 Cancer Staging   Staging form: Colon and Rectum, AJCC 8th Edition - Clinical stage from 02/08/2021: Stage IIIB (cT4a, cN1, cM0) - Signed  by [name], MD on 03/06/2021 Stage prefix: Initial diagnosis   02/27/2021 Procedure   medi port placed by Dr.Dew   03/18/2021 - 07/05/2021 Chemotherapy    FOLFOX q14d x 4 months      07/17/2021 - 08/27/2021 Chemotherapy   Xeloda  concurrent with Radiation   11/18/2021 Surgery   patient is status post open APR with flap for rectal cancer. Pathology rectal adenomacarcinoma ypT3N0.  Positive margin: Radial (circumferential) or mesenteric: adjacent to perforation      01/27/2022 - 02/02/2022 Hospital Admission   Hospitalized at [facility] due to recurrent abscess.  US  guided drain into the pelvic abscess with return of 60 ml of purulent fluid. Cultures positive for staph aureus and strep agalactiae group b, fungal cultures negative.    02/25/2022 Imaging   CT chest angiogram with and without contrast, CT abdomen pelvis with and without contrast 1. Negative for acute pulmonary embolus.2. Emphysema. Further decrease in size of the previously noted irregular nodule in the left upper lobe which is now barely measurable today. No new suspicious lung nodules 3. Status post left lower quadrant colostomy. Further decrease in size since comparison exam from May of the rim enhancing gas and fluid collection at the pelvic surgical bed extending from the perineum superiorly into the pelvis 4. Slightly thickened appearance of terminal ileal small bowel loops in the pelvis with mild stranding suggesting small bowel inflammatory process. 5. Stable enlarged right pelvic sidewall lymph node   06/02/2022 Imaging   CT chest abdomen pelvis w contrast 1. Status post abdominal  perineal resection with descending colostomy. 2. At the site of pelvic fluid and gas collection on 02/25/2022, there is residual, decreased presacral soft tissue fullness, without drainable collection. 3. Similar right obturator nodal metastasis.4.  No acute process or evidence of metastatic disease in the chest. 5. Age advanced coronary artery  atherosclerosis. Recommend assessment of coronary risk factors. 6. Aortic atherosclerosis and emphysema     06/26/2022 Imaging   CT abdomen pelvis with contrast showed 1. Interval increase in size of a lobulated partially imaged at least 7.8 x 3.8 x 12 cm abscess in the perianal region in a patient status post abdominal perineal resection and left lower lobe end colostomy formation. Finding extends to involve the pre sacral region up to the urinary dome level. Associated posterior urinary bladder wall thickening with lack of intraperitoneal fat plane between the presacral soft tissue thickening/abscess formation suggestive of possible fistulization and invasion of the posterior bladder wall. Underlying recurrent malignancy is not excluded. 2. Stable 2.7 cm right pelvic sidewall lymph node. 3.  Aortic Atherosclerosis   06/26/2022 - 06/27/2022 Hospital Admission   He went to [facility] ER and CT showed recurrent abscess. He was transferred to [facility] due to recurrent abscess, treated with IV vanc/zosyn , IR was consulted and drainage tube was placed. Wound grew up Strep viridans. Patient was seen by ID at [facility] discharged with Augmentin  for 2 weeks.   He was seen by wound care Dr> Miki Alert on 07/09/2022, JP drain was removed.    09/29/2022 Imaging   MRI pelvis without contrast showed  Interval abdominoperitoneal resection since prior MRI. Decreased small fluid collection in the surgical bed compared with more recent CT, consistent with resolving postoperative fluid collection or abscess.   Bulky rounded presacral mass, which shows diffuse restricted diffusion, without significant change in size since most recent CT of 06/26/2022. This raises suspicion for recurrent carcinoma over post treatment changes. Recommend correlation with CEA level, and consider tissue sampling or PET-CT.   Stable enlarged right pelvic sidewall lymph node. No new or increased adenopathy identified.   10/09/2022 Imaging   CT chest  with contrast showed 1. Unchanged 0.4 cm fissural nodule of the anterior left lower lobe. This is almost certainly a benign fissural lymph node. No new or suspicious pulmonary nodules. 2. Moderate emphysema and diffuse bilateral bronchial wall thickening. 3. Coronary artery disease.   10/13/2022 Imaging   CT abdomen pelvis with contrast showed 1. Complex collection persists in the lower pelvis, extending from the anal verge upwards into the presacral space and anteriorly from the presacral space to the bladder dome, not significantly changed in size or extent compared to the earlier CT of 06/26/2022. This is most likely a combination of a postoperative seroma and/or chronic phlegmon/abscess versus recurrent abscess. 2. Bladder walls are thick walled/edematous. Given the contiguity of the presacral collection and the bladder dome, this is highly suspicious for a related bladder wall infection and possibly secondary to colovesical fistula. Recommend correlation with urinalysis. 3. No evidence of bowel obstruction. LEFT lower abdominal wall colostomy, without obstruction or inflammatory change. 4. No free intraperitoneal air   10/13/2022 [facility] Admission   Hospitalized due to fever and rectal pain. Urine culture showed mixed urogenital flora. IR CT guided Aspiration of abscess showed Streptococcal Viridans. He was placed back on Augmentin   10/14/2023 CT read by [facility] radiology Postsurgical changes of abdominoperineal resection. Interval enlargement of  fluid collection in the surgical bed. Multiple foci of enhancing tissue at  the margins of  this fluid collection, increased from prior study, suspicious for local recurrence.   Prominent retroperitoneal lymph nodes, some which are increased in size from prior study. These are indeterminate and may be either reactive or represent metastatic disease. Recommend continued attention on follow-up.   Centrally hypoattenuating right obturator lymph node,  unchanged in size, consistent with treated disease.   Circumferential bladder wall thickening and irregularity, most pronounced on the left side. This may be reactive in the setting of adjacent pelvic inflammatory changes. Correlate with urinalysis if there is clinical  concern for urinary tract infection.    11/14/2022 Imaging   MRI pelvis w wo contrast  1. Status post abdominoperineal resection with left lower quadrant end colostomy. 2. Compared to prior CT and MR, no significant change in appearance of heterogeneous, rim enhancing presacral and low pelvic soft tissue and fluid. Largest heterogeneously enhancing conglomerate of fluid and soft tissue appears to closely involve the anteriorly abutting the seminal vesicles and measures 3.0 x 2.9 cm in largest axial dimension. This extends to superiorly contact the posterior bladder dome and inferiorly towards the gluteal cleft. 3. Discrete fluid component at the most inferior extent measuring 2.4 x 1.3 cm. This is markedly diminished in volume compared to more remote previous examinations, for example 06/26/2022. 4. Constellation of findings is highly concerning for locally recurrent rectal malignancy with or without superimposed infection. Presence or absence of infection at this time is not established by MR. 5. Additional unchanged hemorrhagic or proteinaceous fluid collection in the right hemipelvis measuring 3.2 x 2.6 cm most consistent with postoperative hematoma or seroma. 6. Unchanged thickening of the urinary bladder wall. As previously reported, fistula or involvement of bladder wall by malignancy not excluded.   12/09/2022 Procedure   CT guided biopsy of the pelvic mass showed Moderately differentiated adenocarcinoma with dirty necrosis, morphologically identical to patient's prior rectal adenocarcinoma.   [facility] NGS xT 648 panel showed MLH3 start loss,  BCORL1 frame shift, TP53 splice region varian, APC stop gain,  TMB 7.9, MS stable,  KRAS/BRAF/NRAS negative.   [facility] NGS xR showed no gene arrangement or reportable altered splicing events in RNA sequencing      01/07/2023 -  Chemotherapy   Xeloda  825 mg/m2 twice daily  with radiation   02/11/2023 Imaging   CT abdomen pelvis w contrast  1. Examination is positive for small bowel obstruction. Transition point is identified within the left iliac fossa. Distal to the transition point the small bowel loops exhibit mucosal enhancement and wall thickening concerning for enteritis. 2. There is new right-sided hydronephrosis and hydroureter up to the level of the bifurcation of right common iliac artery. The distal right ureter appears closely associated with the previously characterized tracer avid presacral soft tissue mass. Cannot exclude obstructive uropathy secondary to tumor involvement. 3. Similar appearance of presacral soft tissue mass. This was tracer avid on the recent PET-CT from 12/17/2022, and concerning for locally recurrent tumor. 4. Unchanged diffuse circumferential wall thickening involving the urinary bladder. 5. Aortic Atherosclerosis    04/17/2023 - 07/29/2023 Chemotherapy   Patient is on Treatment Plan : COLORECTAL FOLFIRI + Panitumumab  q14d     08/14/2023 Imaging   CT chest abdomen pelvis w contrast showed  Heterogeneous central pelvic mass in the area of the rectum overall similar configuration to the previous CT scan. Again the lesion abuts the bladder in the area of the seminal vesicles. Extension up along the presacral space. Surgical changes from diverting colostomy.   Essentially stable right pelvic  sidewall soft tissue mass or nodal enlargement. No new nodal enlargement elsewhere in the chest, abdomen or pelvis.   Fatty liver infiltration.   Subtle nodular opacity along the right lung base. Infiltrates possible. Please correlate with symptoms and recommend follow-up.   Incidental right lower lobe segmental and lobar pulmonary embolism.      09/23/2023 -  Chemotherapy   Patient is on Treatment Plan : COLORECTAL Xeloda  + Panitumumab  q21d     12/10/2023 Imaging   CT chest abdomen pelvis w contrast showed 1. Similar appearance of soft tissue mass within the posterior pelvis in the region of the rectum. This is stable to mildly decreased in the interval. As noted previously tumor extends along the presacral soft tissue space and abuts the posterior dome of bladder and posterior bladder base. 2. Stable appearance of soft tissue mass/enlarged lymph node within the right posterior pelvic sidewall. 3. No new or progressive disease identified. 4.  Aortic Atherosclerosis (ICD10-I70.0).       INTERVAL HISTORY Barry [name]. is a 58 y.o. male who has above history reviewed by me today presents for follow up visit for management of recurrent locally advanced rectal cancer. Denies fever chills, rectal pain. Denies rectal discharge.   No diarrhea after each treatments.   Denies nausea vomiting or abdominal pain.  He reports feeling tired, SOB + exertion. No leg swelling.  He reports that he has been compliant with Xarelto  20mg  daily      Review of Systems  Constitutional:  Positive for fatigue. Negative for appetite change, chills, fever and unexpected weight change.  HENT:   Negative for hearing loss and voice change.   Eyes:  Negative for eye problems and icterus.  Respiratory:  Negative for chest tightness, cough and shortness of breath.   Cardiovascular:  Negative for chest pain and leg swelling.  Gastrointestinal:  Negative for abdominal distention, abdominal pain, blood in stool, constipation, diarrhea and rectal pain.  Endocrine: Negative for hot flashes.  Genitourinary:  Negative for difficulty urinating, dysuria and frequency.   Musculoskeletal:  Negative for arthralgias.  Skin:  Negative for itching and rash.  Neurological:  Negative for light-headedness and numbness.  Hematological:  Negative for adenopathy. Does  not bruise/bleed easily.  Psychiatric/Behavioral:  Negative for confusion.     MEDICAL HISTORY:  Past Medical History:  Diagnosis Date   Headache    Left shoulder pain    Rectal cancer (HCC)     SURGICAL HISTORY: Past Surgical History:  Procedure Laterality Date   COLON SURGERY     COLONOSCOPY WITH PROPOFOL  N/A 02/06/2021   Procedure: COLONOSCOPY WITH PROPOFOL ;  Surgeon: Selena Daily, MD;  Location: [facility] ENDOSCOPY;  Service: Gastroenterology;  Laterality: N/A;   PORTA CATH INSERTION N/A 02/27/2021   Procedure: PORTA CATH INSERTION;  Surgeon: Celso College, MD;  Location: [facility] INVASIVE CV LAB;  Service: Cardiovascular;  Laterality: N/A;   ROTATOR CUFF REPAIR Left     SOCIAL HISTORY: Social History   Socioeconomic History   Marital status: Married    Spouse name: Not on file   Number of children: Not on file   Years of education: Not on file   Highest education level: Not on file  Occupational History   Not on file  Tobacco Use   Smoking status: Former    Current packs/day: 0.00    Average packs/day: 1 pack/day for 10.0 years (10.0 ttl pk-yrs)    Types: Cigars, Cigarettes    Start date: 02/05/2011  Quit date: 02/04/2021    Years since quitting: 3.0   Smokeless tobacco: Never  Substance and Sexual Activity   Alcohol use: Not Currently   Drug use: No   Sexual activity: Not on file  Other Topics Concern   Not on file  Social History Narrative   Not on file   Social Drivers of Health   Financial Resource Strain: High Risk (02/13/2023)   Received from [facility], [facility]   Overall Financial Resource Strain (CARDIA)    Difficulty of Paying Living Expenses: Hard  Food Insecurity: No Food Insecurity (02/13/2023)   Received from [facility], [facility]   Hunger Vital Sign    Worried About Running Out of Food in the Last Year: Never true    Ran Out of Food in the Last Year: Never true   Transportation Needs: No Transportation Needs (02/13/2023)   Received from [facility], [facility]   [facility] - Transportation    In the past 12 months, has lack of transportation kept you from medical appointments or from getting medications?: No    Lack of Transportation (Non-Medical): No  Physical Activity: Inactive (11/03/2022)   Exercise Vital Sign    Days of Exercise per Week: 0 days    Minutes of Exercise per Session: 0 min  Stress: Stress Concern Present (11/03/2022)   [employer] of Occupational Health - Occupational Stress Questionnaire    Feeling of Stress : Rather much  Social Connections: Moderately Isolated (11/03/2022)   Social Connection and Isolation Panel [NHANES]    Frequency of Communication with Friends and Family: Twice a week    Frequency of Social Gatherings with Friends and Family: Three times a week    Attends Religious Services: 1 to 4 times per year    Active Member of Clubs or Organizations: No    Attends Banker Meetings: Never    Marital Status: Separated  Intimate Partner Violence: Not At Risk (11/03/2022)   Humiliation, Afraid, Rape, and Kick questionnaire    Fear of Current or Ex-Partner: No    Emotionally Abused: No    Physically Abused: No    Sexually Abused: No    FAMILY HISTORY: Family History  Problem Relation Age of Onset   Cancer Sister    Diabetes Mother    Cancer Maternal Grandmother    Cancer Paternal Grandmother     ALLERGIES:  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  MEDICATIONS:  Current Outpatient Medications  Medication Sig Dispense Refill   calcium -vitamin D (OSCAL WITH D) 500-5 MG-MCG tablet Take 2 tablets by mouth daily. 60 tablet 3   capecitabine  (XELODA ) 150 MG tablet TAKE 1 TABLET BY MOUTH 2 TIMES A DAY AFTER A MEAL FOR 14 DAYS ON, THEN 7 DAYS OFF, REPEAT EVERY 21 DAYS. (TOTAL DOSE OF 1,650 MG 2 TIMES A DAY) 28 tablet 1   capecitabine  (XELODA ) 500 MG tablet TAKE 3 TABLETS BY  MOUTH 2 TIMES A DAY AFTER A MEAL FOR 14 DAYS ON, THEN 7 DAYS OFF, REPEAT EVERY 21 DAYS. (TOTAL DOSE OF 1,650 MG 2 TIMES A DAY) 84 tablet 1   citalopram  (CELEXA ) 10 MG tablet Take 1 tablet (10 mg total) by mouth daily. 30 tablet 3   clindamycin  (CLINDAGEL ) 1 % gel Apply topically 2 (two) times daily. 30 g 4   lidocaine -prilocaine  (EMLA ) cream Apply 1 Application topically as needed. Apply small amount to port and cover with saran  wrap 1-2 hours prior to port access 30 g 2   loperamide  (IMODIUM ) 2 MG capsule Take 1 capsule (2 mg total) by mouth See admin instructions. 90 capsule 2   losartan  (COZAAR ) 25 MG tablet Take 1 tablet (25 mg total) by mouth daily. 90 tablet 1   magnesium  chloride (SLOW-MAG) 64 MG TBEC SR tablet Take 1 tablet (64 mg total) by mouth daily. 90 tablet 1   prochlorperazine  (COMPAZINE ) 10 MG tablet Take 1 tablet (10 mg total) by mouth every 6 (six) hours as needed for nausea or vomiting. 90 tablet 1   rivaroxaban  (XARELTO ) 10 MG TABS tablet Take 1 tablet (10 mg total) by mouth daily. 30 tablet 5   senna (SENOKOT) 8.6 MG TABS tablet Take 2 tablets (17.2 mg total) by mouth daily. 100 tablet 0   traZODone  (DESYREL ) 50 MG tablet Take 1 tablet (50 mg total) by mouth at bedtime as needed for sleep. 30 tablet 3   ondansetron  (ZOFRAN ) 8 MG tablet Take 1 tablet (8 mg total) by mouth every 8 (eight) hours as needed for nausea or vomiting. 90 tablet 1   potassium chloride  SA (KLOR-CON  M) 20 MEQ tablet Take 2 tablets (40 mEq total) by mouth in the morning and take 1 tablet ( ) in the evening. 60 tablet 2   No current facility-administered medications for this visit.   Facility-Administered Medications Ordered in Other Visits  Medication Dose Route Frequency Provider Last Rate Last Admin   0.9 %  sodium chloride  infusion   Intravenous Continuous [name], MD 10 mL/hr at 02/11/24 0926 New Bag at 02/11/24 0926   heparin  lock flush 100 unit/mL  500 Units Intravenous Once [name], MD        heparin  lock flush 100 unit/mL  500 Units Intracatheter Once PRN [name], MD       panitumumab  (VECTIBIX ) 700 mg in sodium chloride  0.9 % 100 mL chemo infusion  9 mg/kg (Order-Specific) Intravenous Once [name], MD 270 mL/hr at 02/11/24 0956 700 mg at 02/11/24 0956     PHYSICAL EXAMINATION: ECOG PERFORMANCE STATUS: 1 - Symptomatic but completely ambulatory Vitals:   02/11/24 0849 02/11/24 0857  BP: (!) 140/99 (!) 131/95  Pulse: 83   Resp: 18   Temp: 97.9 F (36.6 C)   SpO2: 100%      Filed Weights   02/11/24 0849  Weight: 186 lb 9.6 oz (84.6 kg)       Physical Exam HENT:     Head: Normocephalic and atraumatic.  Eyes:     General: No scleral icterus. Cardiovascular:     Rate and Rhythm: Normal rate and regular rhythm.     Heart sounds: Normal heart sounds.  Pulmonary:     Effort: Pulmonary effort is normal. No respiratory distress.     Breath sounds: No wheezing.     Comments: Decreased breath sound bilaterally.  Abdominal:     General: Bowel sounds are normal. There is no distension.     Palpations: Abdomen is soft.     Comments: Colostomy bag  Genitourinary:    Comments: History of APR, Musculoskeletal:        General: No deformity. Normal range of motion.     Cervical back: Normal range of motion and neck supple.  Skin:    General: Skin is warm and dry.     Findings: No erythema.  Neurological:     Mental Status: He is alert and oriented to person, place, and time. Mental status  is at baseline.     Cranial Nerves: No cranial nerve deficit.  Psychiatric:        Mood and Affect: Mood normal.     LABORATORY DATA:  I have reviewed the data as listed    Latest Ref Rng & Units 02/11/2024    8:35 AM 01/06/2024    8:54 AM 12/16/2023    8:38 AM  CBC  WBC 4.0 - 10.5 K/uL 5.6  4.2  3.9   Hemoglobin 13.0 - 17.0 g/dL 16.1  09.6  04.5   Hematocrit 39.0 - 52.0 % 41.4  45.4  46.6   Platelets 150 - 400 K/uL 204  188  218       Latest Ref Rng & Units 02/11/2024     8:35 AM 01/06/2024    8:54 AM 12/16/2023    8:38 AM  CMP  Glucose 70 - 99 mg/dL 409  811  914   BUN 6 - 20 mg/dL 9  9  9    Creatinine 0.61 - 1.24 mg/dL 7.82  9.56  2.13   Sodium 135 - 145 mmol/L 138  138  138   Potassium 3.5 - 5.1 mmol/L 3.4  3.2  3.9   Chloride 98 - 111 mmol/L 107  104  104   CO2 22 - 32 mmol/L 24  25  25    Calcium  8.9 - 10.3 mg/dL 8.1  8.6  8.8   Total Protein 6.5 - 8.1 g/dL 6.8  7.2  7.1   Total Bilirubin 0.0 - 1.2 mg/dL 0.6  0.7  0.7   Alkaline Phos 38 - 126 U/L 93  83  88   AST 15 - 41 U/L 18  24  20    ALT 0 - 44 U/L 17  23  19       RADIOGRAPHIC STUDIES: I have personally reviewed the radiological images as listed and agreed with the findings in the report. CT CHEST ABDOMEN PELVIS W CONTRAST Result Date: 12/16/2023 CLINICAL DATA:  Restaging rectal cancer.  * Tracking Code: BO * EXAM: CT CHEST, ABDOMEN, AND PELVIS WITH CONTRAST TECHNIQUE: Multidetector CT imaging of the chest, abdomen and pelvis was performed following the standard protocol during bolus administration of intravenous contrast. RADIATION DOSE REDUCTION: This exam was performed according to the departmental dose-optimization program which includes automated exposure control, adjustment of the mA and/or kV according to patient size and/or use of iterative reconstruction technique. CONTRAST:  OMNIPAQUE  IOHEXOL  300 MG/ML  SOLN COMPARISON:  08/10/2023 FINDINGS: CT CHEST FINDINGS Cardiovascular: Heart size appears within normal limits. No pericardial effusion. Aortic atherosclerosis. Mediastinum/Nodes: Thyroid gland, trachea and esophagus are unremarkable. No enlarged axillary, supraclavicular, mediastinal or hilar lymph nodes. Lungs/Pleura: No pleural effusion, airspace consolidation, atelectasis or pneumothorax. Centrilobular emphysema with diffuse bronchial wall thickening noted. Perifissural nodule along the lateral aspect of the oblique fissure measures 5 mm and most likely reflects a benign intrapulmonary  lymph node. Unchanged from previous exam, image 102/4. Musculoskeletal: No chest wall mass or suspicious bone lesions identified. CT ABDOMEN PELVIS FINDINGS Hepatobiliary: Small subcapsular cyst along the inferior medial margin of the right lobe of liver is unchanged measuring 1.1 cm, image 77/2. Gallbladder appears normal. No bile duct dilatation. Pancreas: Unremarkable. No pancreatic ductal dilatation or surrounding inflammatory changes. Spleen: Normal in size without focal abnormality. Adrenals/Urinary Tract: Normal adrenal glands. No nephrolithiasis, hydronephrosis or suspicious mass. Stomach/Bowel: Stomach appears normal. Left lower quadrant colostomy. No pathologic dilatation of the large or small bowel loops. The appendix is visualized and  appears normal. Soft tissue mass within the posterior pelvis is again noted. On the axial images this measures 4.9 x 4.8 cm,, image 125/2. Previously 5.3 x 4.9 cm. Cephalocaudal extension within the presacral soft tissue space measures 9.3 cm, image 80/6. This is compared with 10.3 cm previously Similar appearance of tumor extension from the presacral soft tissue space to the posterior dome of the bladder. Tumor also appears to abut the posterior bladder base at the level of the seminal vesicles. Vascular/Lymphatic: Aortic atherosclerosis. No abdominal adenopathy identified. Within the right posterior pelvic sidewall there is a soft tissue mass/enlarged lymph node which measures 3.1 x 2.6 cm, image 121/2. On the previous exam this measured 3.3 by 2.6 cm Reproductive: Poor definition of the prostate gland and seminal vesicles. Other: There is no ascites or focal fluid collections. No signs of pneumoperitoneum. Musculoskeletal: No acute or significant osseous findings. IMPRESSION: 1. Similar appearance of soft tissue mass within the posterior pelvis in the region of the rectum. This is stable to mildly decreased in the interval. As noted previously tumor extends along the  presacral soft tissue space and abuts the posterior dome of bladder and posterior bladder base. 2. Stable appearance of soft tissue mass/enlarged lymph node within the right posterior pelvic sidewall. 3. No new or progressive disease identified. 4.  Aortic Atherosclerosis (ICD10-I70.0). Electronically Signed   By: Kimberley Penman M.D.   On: 12/16/2023 10:59

## 2024-02-11 NOTE — Patient Instructions (Signed)
 [facility]  Discharge Instructions: Thank you for choosing [facility] to provide your oncology and hematology care.  If you have a lab appointment with the Cancer Center, please go directly to the Cancer Center and check in at the registration area.  Wear comfortable clothing and clothing appropriate for easy access to any Portacath or PICC line.   We strive to give you quality time with your provider. You may need to reschedule your appointment if you arrive late (15 or more minutes).  Arriving late affects you and other patients whose appointments are after yours.  Also, if you miss three or more appointments without notifying the office, you may be dismissed from the clinic at the provider's discretion.      For prescription refill requests, have your pharmacy contact our office and allow 72 hours for refills to be completed.    Today you received the following chemotherapy and/or immunotherapy agents Vectibix.      To help prevent nausea and vomiting after your treatment, we encourage you to take your nausea medication as directed.  BELOW ARE SYMPTOMS THAT SHOULD BE REPORTED IMMEDIATELY: *FEVER GREATER THAN 100.4 F (38 C) OR HIGHER *CHILLS OR SWEATING *NAUSEA AND VOMITING THAT IS NOT CONTROLLED WITH YOUR NAUSEA MEDICATION *UNUSUAL SHORTNESS OF BREATH *UNUSUAL BRUISING OR BLEEDING *URINARY PROBLEMS (pain or burning when urinating, or frequent urination) *BOWEL PROBLEMS (unusual diarrhea, constipation, pain near the anus) TENDERNESS IN MOUTH AND THROAT WITH OR WITHOUT PRESENCE OF ULCERS (sore throat, sores in mouth, or a toothache) UNUSUAL RASH, SWELLING OR PAIN  UNUSUAL VAGINAL DISCHARGE OR ITCHING   Items with * indicate a potential emergency and should be followed up as soon as possible or go to the Emergency Department if any problems should occur.  Please show the CHEMOTHERAPY ALERT CARD or IMMUNOTHERAPY  ALERT CARD at check-in to the Emergency Department and triage nurse.  Should you have questions after your visit or need to cancel or reschedule your appointment, please contact [facility] - A DEPT OF [facility]  [phone] and follow the prompts.  Office hours are 8:00 a.m. to 4:30 p.m. Monday - Friday. Please note that voicemails left after 4:00 p.m. may not be returned until the following business day.  We are closed weekends and major holidays. You have access to a nurse at all times for urgent questions. Please call the main number to the clinic [phone] and follow the prompts.  For any non-urgent questions, you may also contact your provider using MyChart. We now offer e-Visits for anyone 49 and older to request care online for non-urgent symptoms. For details visit [email or web address].   Also download the MyChart app! Go to the app store, search "MyChart", open the app, select [place], and log in with your MyChart username and password.

## 2024-02-12 LAB — CEA: CEA: 2 ng/mL (ref 0.0–4.7)

## 2024-02-25 ENCOUNTER — Encounter: Payer: Self-pay | Admitting: Oncology

## 2024-03-02 ENCOUNTER — Other Ambulatory Visit: Payer: Self-pay

## 2024-03-02 ENCOUNTER — Encounter: Payer: Self-pay | Admitting: Oncology

## 2024-03-02 DIAGNOSIS — C20 Malignant neoplasm of rectum: Secondary | ICD-10-CM

## 2024-03-03 ENCOUNTER — Encounter: Payer: Self-pay | Admitting: Oncology

## 2024-03-03 ENCOUNTER — Other Ambulatory Visit: Payer: Self-pay

## 2024-03-03 ENCOUNTER — Ambulatory Visit: Admitting: Oncology

## 2024-03-03 ENCOUNTER — Other Ambulatory Visit

## 2024-03-03 ENCOUNTER — Inpatient Hospital Stay

## 2024-03-03 ENCOUNTER — Inpatient Hospital Stay (HOSPITAL_BASED_OUTPATIENT_CLINIC_OR_DEPARTMENT_OTHER): Admitting: Oncology

## 2024-03-03 VITALS — BP 129/95 | HR 93 | Temp 98.9°F | Resp 19 | Wt 181.4 lb

## 2024-03-03 DIAGNOSIS — C20 Malignant neoplasm of rectum: Secondary | ICD-10-CM | POA: Diagnosis not present

## 2024-03-03 DIAGNOSIS — R042 Hemoptysis: Secondary | ICD-10-CM

## 2024-03-03 DIAGNOSIS — Z5112 Encounter for antineoplastic immunotherapy: Secondary | ICD-10-CM | POA: Diagnosis not present

## 2024-03-03 DIAGNOSIS — E876 Hypokalemia: Secondary | ICD-10-CM

## 2024-03-03 DIAGNOSIS — I10 Essential (primary) hypertension: Secondary | ICD-10-CM | POA: Diagnosis not present

## 2024-03-03 DIAGNOSIS — Z86711 Personal history of pulmonary embolism: Secondary | ICD-10-CM

## 2024-03-03 DIAGNOSIS — Z5111 Encounter for antineoplastic chemotherapy: Secondary | ICD-10-CM

## 2024-03-03 LAB — CBC WITH DIFFERENTIAL (CANCER CENTER ONLY)
Abs Immature Granulocytes: 0.02 10*3/uL (ref 0.00–0.07)
Basophils Absolute: 0 10*3/uL (ref 0.0–0.1)
Basophils Relative: 1 %
Eosinophils Absolute: 0.2 10*3/uL (ref 0.0–0.5)
Eosinophils Relative: 3 %
HCT: 45 % (ref 39.0–52.0)
Hemoglobin: 15.8 g/dL (ref 13.0–17.0)
Immature Granulocytes: 0 %
Lymphocytes Relative: 26 %
Lymphs Abs: 1.3 10*3/uL (ref 0.7–4.0)
MCH: 32.4 pg (ref 26.0–34.0)
MCHC: 35.1 g/dL (ref 30.0–36.0)
MCV: 92.2 fL (ref 80.0–100.0)
Monocytes Absolute: 0.7 10*3/uL (ref 0.1–1.0)
Monocytes Relative: 14 %
Neutro Abs: 2.8 10*3/uL (ref 1.7–7.7)
Neutrophils Relative %: 56 %
Platelet Count: 205 10*3/uL (ref 150–400)
RBC: 4.88 MIL/uL (ref 4.22–5.81)
RDW: 14 % (ref 11.5–15.5)
Smear Review: NORMAL
WBC Count: 5 10*3/uL (ref 4.0–10.5)
nRBC: 0.4 % — ABNORMAL HIGH (ref 0.0–0.2)

## 2024-03-03 LAB — CMP (CANCER CENTER ONLY)
ALT: 18 U/L (ref 0–44)
AST: 20 U/L (ref 15–41)
Albumin: 3.7 g/dL (ref 3.5–5.0)
Alkaline Phosphatase: 99 U/L (ref 38–126)
Anion gap: 10 (ref 5–15)
BUN: 9 mg/dL (ref 6–20)
CO2: 25 mmol/L (ref 22–32)
Calcium: 9.2 mg/dL (ref 8.9–10.3)
Chloride: 105 mmol/L (ref 98–111)
Creatinine: 0.99 mg/dL (ref 0.61–1.24)
GFR, Estimated: >60 mL/min (ref >60)
Glucose, Bld: 136 mg/dL — ABNORMAL HIGH (ref 70–99)
Potassium: 3.5 mmol/L (ref 3.5–5.1)
Sodium: 140 mmol/L (ref 135–145)
Total Bilirubin: 0.8 mg/dL (ref 0.0–1.2)
Total Protein: 7.3 g/dL (ref 6.5–8.1)

## 2024-03-03 LAB — VITAMIN D 25 HYDROXY (VIT D DEFICIENCY, FRACTURES): Vit D, 25-Hydroxy: 14.49 ng/mL — ABNORMAL LOW (ref 30–100)

## 2024-03-03 LAB — MAGNESIUM: Magnesium: 1.7 mg/dL (ref 1.7–2.4)

## 2024-03-03 MED ORDER — HEPARIN SOD (PORK) LOCK FLUSH 100 UNIT/ML IV SOLN
500.0000 [IU] | Freq: Once | INTRAVENOUS | Status: AC | PRN
  Administered 2024-03-03: 500 [IU]
  Filled 2024-03-03: qty 5

## 2024-03-03 MED ORDER — SODIUM CHLORIDE 0.9 % IV SOLN
9.0000 mg/kg | Freq: Once | INTRAVENOUS | Status: AC
  Administered 2024-03-03: 700 mg via INTRAVENOUS
  Filled 2024-03-03: qty 20

## 2024-03-03 MED ORDER — SODIUM CHLORIDE 0.9% FLUSH
10.0000 mL | INTRAVENOUS | Status: DC | PRN
Start: 2024-03-03 — End: 2024-03-03
  Administered 2024-03-03: 10 mL
  Filled 2024-03-03: qty 10

## 2024-03-03 MED ORDER — SODIUM CHLORIDE 0.9 % IV SOLN
INTRAVENOUS | Status: DC
  Filled 2024-03-03: qty 250

## 2024-03-03 MED ORDER — LOSARTAN POTASSIUM 25 MG PO TABS
25.0000 mg | ORAL_TABLET | Freq: Every day | ORAL | 1 refills | Status: DC
  Filled 2024-03-03 (×2): qty 90, 90d supply, fill #0

## 2024-03-03 NOTE — Assessment & Plan Note (Signed)
 Continue  potassium to in AM and in PM.

## 2024-03-03 NOTE — Progress Notes (Signed)
 Hematology/Oncology Progress note [phone] [phone]      CHIEF COMPLAINTS/REASON FOR VISIT:  Follow up for recurrent rectal cancer treatment  ASSESSMENT & PLAN:   Cancer Staging  Rectal cancer [facility]) Staging form: Colon and Rectum, AJCC 8th Edition - Clinical stage from 02/08/2021: Stage IIIB (cT4a, cN1, cM0) - Signed by Babara Call, MD on 03/06/2021   Rectal cancer [facility]) locally advanced rectal cancer-Stage IIIB, s/p TNT neoadjuvant protocol, concurrent Xeloda  and radiation-Finished 08/27/2021, s/p  APR,rectal adenocarcinoma, ypT3N0. Positive radial margin due to perforation. Biopsy proven recurrent rectal cancer, locally advanced disease, possible colovesical fistula; no distant metastatic disease on PET- per [name] Surgeon Dr. Florie, recurrence is not resectable. --> June 2024 S/p concurrent Xeloda  825 mg/m2 twice daily  with radiation --> July 2024 MRI pelvis w wo contrast showed persistent disease. Disease is not resectable per colorectal surgeon --> FOLFIRI Panitumumab  --> Dec 2025 partial response. --> per pt's preference switched to Xeloda  + Panitumumab . [Pt declined 5-FU pump] Labs are reviewed and discussed with patient.  Proceed with cycle 5 Xeloda  850 mg/m2 twice daily on days 1 to 14 of a 21-day cycle plus Panitumumab  Q3 weeks. [Panel study regimen].  He tolerates regimen well.  Repeat CT in July  Hypocalcemia Continue calcium  1000mg  daily.  Check Vitamin D level  Hypokalemia Continue  potassium to 40meq in AM and 20meq in PM.    Hypertension Continue  losartan  25mg  daily. Recommend patient to monitor BP at home.  Follow up with PCP  Encounter for antineoplastic chemotherapy Chemotherapy plan as listed  Hypomagnesemia continue slow Mag 1 tab daily.    History of pulmonary embolism December 2024 right lower lobe segmental and lobar pulmonary embolism On  Xarelto  to 10mg  daily. - he reports that he can not afford copay.  Obtain CT chest  angiogram - hemoptysis.   Hemoptysis Obtain CTA chest       Orders Placed This Encounter  Procedures   CT Angio Chest Pulmonary Embolism (PE) W or WO Contrast    Standing Status:   Future    Expiration Date:   03/03/2025    If indicated for the ordered procedure, I authorize the administration of contrast media per Radiology protocol:   Yes    Does the patient have a contrast media/X-ray dye allergy?:   No    Preferred imaging location?:   [facility]   CT ABDOMEN PELVIS W CONTRAST    Standing Status:   Future    Expected Date:   03/10/2024    Expiration Date:   03/03/2025    If indicated for the ordered procedure, I authorize the administration of contrast media per Radiology protocol:   Yes    Does the patient have a contrast media/X-ray dye allergy?:   No    Preferred imaging location?:   [facility]    If indicated for the ordered procedure, I authorize the administration of oral contrast media per Radiology protocol:   Yes   Magnesium     Standing Status:   Future    Expected Date:   03/03/2024    Expiration Date:   03/03/2025   CEA    Standing Status:   Future    Expected Date:   03/03/2024    Expiration Date:   03/03/2025   CBC with Differential (Cancer Center Only)    Standing Status:   Future    Expected Date:   03/03/2024    Expiration Date:   03/03/2025   [facility] (Cancer Center  only)    Standing Status:   Future    Expected Date:   03/03/2024    Expiration Date:   03/03/2025   Magnesium     Standing Status:   Future    Expected Date:   04/08/2024    Expiration Date:   04/08/2025   CEA    Standing Status:   Future    Expected Date:   04/08/2024    Expiration Date:   04/08/2025   CBC with Differential (Cancer Center Only)    Standing Status:   Future    Expected Date:   04/08/2024    Expiration Date:   04/08/2025   CMP (Cancer Center only)    Standing Status:   Future    Expected Date:   04/08/2024    Expiration Date:   04/08/2025   Magnesium     Standing Status:   Future     Expected Date:   04/29/2024    Expiration Date:   04/29/2025   CEA    Standing Status:   Future    Expected Date:   04/29/2024    Expiration Date:   04/29/2025   CBC with Differential (Cancer Center Only)    Standing Status:   Future    Expected Date:   04/29/2024    Expiration Date:   04/29/2025   CMP (Cancer Center only)    Standing Status:   Future    Expected Date:   04/29/2024    Expiration Date:   04/29/2025    Follow-up  3 weeks All questions were answered. The patient knows to call the clinic with any problems, questions or concerns.  Zelphia Cap, MD, PhD [facility] Hematology Oncology 03/03/2024     HISTORY OF PRESENTING ILLNESS:   Barry Whitefield. is a  58 y.o.  male presents for rectal cancer Oncology History  Rectal cancer (HCC)  02/08/2021 Initial Diagnosis   Rectal cancer   02/05/2021-02/06/2021 patient was hospitalized due to generalized weakness, intermittent lightheadedness, weight loss and worsening constipation.  02/05/2021 CT abdomen showed concerning of severe rectal wall thickening and right internal iliac lymph node concerning for metastatic disease.  Patient was seen by gastroenterology and had colonoscopy which showed a circumferential fungating mass in the rectum.  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   02/14/2021-02/16/2021 hospitalized due to rectal bleeding and pain.   02/14/2021 CT showed perirectal fluid collection/gas concerning for infection with significant leukocytosis, anemia with hemoglobin of 6.8.  Patient received PRBC transfusion, IV antibiotics.  He underwent an IR guided placement of JP drain into the rectal abscess.  Discharged home with oral Augmentin . 02/20/2021 presented to ER with new skin opening and draining from left gluteus.  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  JP drain has been removed.  Patient was continued on Augmentin .   02/13/2021, PET scan showed locally advanced rectal cancer with billowing of the  mesorectum now with low attenuation material that was not present on previous examination with extensive stranding and inflammation.   Bulky RIGHT pelvic sidewall/hypogastric lymph node outside of the mesorectum with stippled calcification measuring 19 mm-no increased metabolic activity. Small LEFT hypogastric lymph node just peripheral to the internal external bifurcation-SUV 3.2 High RIGHT internal just below or at the internal/common iliac transition, lymph node -SUV 4.8 LEFT high hypogastric lymph node  8 mm-SUV 3. Scattered lymph nodes throughout the retroperitoneum with low FDG uptake Bilateral inguinal lymph nodes largest on the RIGHT (image 248/3) 11 mm with a maximum  SUV of 2.8 spiculated nodule in the LEFT upper lobe- 9 x 8 mm       02/14/2021, CT abdomen pelvis without contrast Showed perforated rectal mass with contained perforation with fluid and gas extending above and below the pelvic floor,potentially involving the sphincter complex and extending into LEFT ischial rectal fossa   02/20/2021, CT pelvis with contrast showed Large perirectal/perianal abscess has markedly decreased in size since placement of the percutaneous drain. There are residual gas-filled collections in the soft tissues and suspect there is a fistula or sinus tract between the rectal mass and the subcutaneous tissues. Soft tissue gas along the medial left buttock and concern for a cutaneous ulceration in this area. Large rectal mass with evidence for a large necrotic right pelvic lymph node.   His case is complicated with a perirectal abscess. Prior to onset of perirectal abscess, on his 02/05/2021 scan, he was noted to have right internal iliac lymph node 3 x 1 x 2.3 cm which is concerning for nodal disease.On Subsequent images it was difficult to distinguish whether lymphadenopathy was due to nodal disease versus acute inflammation. 03/07/2021 MRI pelvis cT3 N2. Due to the possible contained perforation on previous CT,  possible cT4 disease.    02/27/2021 medi port placed by Dr.Dew 03/13/2021 reports right butt cheek and also left perianal area fullness.he was seen by Dr.Pabon urgently  and had right buttock abscess drained. Left perianal fullness was felt to be due to cancer.    02/08/2021 Cancer Staging   Staging form: Colon and Rectum, AJCC 8th Edition - Clinical stage from 02/08/2021: Stage IIIB (cT4a, cN1, cM0) - Signed by Babara Call, MD on 03/06/2021 Stage prefix: Initial diagnosis   02/27/2021 Procedure   medi port placed by Dr.Dew   03/18/2021 - 07/05/2021 Chemotherapy    FOLFOX q14d x 4 months      07/17/2021 - 08/27/2021 Chemotherapy   Xeloda  concurrent with Radiation   11/18/2021 Surgery   patient is status post open APR with flap for rectal cancer. Pathology rectal adenomacarcinoma ypT3N0.  Positive margin: Radial (circumferential) or mesenteric: adjacent to perforation      01/27/2022 - 02/02/2022 Hospital Admission   Hospitalized at [facility] due to recurrent abscess.  US  guided drain into the pelvic abscess with return of 60 ml of purulent fluid. Cultures positive for staph aureus and strep agalactiae group b, fungal cultures negative.    02/25/2022 Imaging   CT chest angiogram with and without contrast, CT abdomen pelvis with and without contrast 1. Negative for acute pulmonary embolus.2. Emphysema. Further decrease in size of the previously noted irregular nodule in the left upper lobe which is now barely measurable today. No new suspicious lung nodules 3. Status post left lower quadrant colostomy. Further decrease in size since comparison exam from May of the rim enhancing gas and fluid collection at the pelvic surgical bed extending from the perineum superiorly into the pelvis 4. Slightly thickened appearance of terminal ileal small bowel loops in the pelvis with mild stranding suggesting small bowel inflammatory process. 5. Stable enlarged right pelvic sidewall lymph node   06/02/2022 Imaging    CT chest abdomen pelvis w contrast 1. Status post abdominal perineal resection with descending colostomy. 2. At the site of pelvic fluid and gas collection on 02/25/2022, there is residual, decreased presacral soft tissue fullness, without drainable collection. 3. Similar right obturator nodal metastasis.4.  No acute process or evidence of metastatic disease in the chest. 5. Age advanced coronary artery atherosclerosis. Recommend assessment  of coronary risk factors. 6. Aortic atherosclerosis and emphysema     06/26/2022 Imaging   CT abdomen pelvis with contrast showed 1. Interval increase in size of a lobulated partially imaged at least 7.8 x 3.8 x 12 cm abscess in the perianal region in a patient status post abdominal perineal resection and left lower lobe end colostomy formation. Finding extends to involve the pre sacral region up to the urinary dome level. Associated posterior urinary bladder wall thickening with lack of intraperitoneal fat plane between the presacral soft tissue thickening/abscess formation suggestive of possible fistulization and invasion of the posterior bladder wall. Underlying recurrent malignancy is not excluded. 2. Stable 2.7 cm right pelvic sidewall lymph node. 3.  Aortic Atherosclerosis   06/26/2022 - 06/27/2022 Hospital Admission   He went to [facility] ER and CT showed recurrent abscess. He was transferred to [facility] due to recurrent abscess, treated with IV vanc/zosyn , IR was consulted and drainage tube was placed. Wound grew up Strep viridans. Patient was seen by ID at [facility] discharged with Augmentin  for 2 weeks.   He was seen by wound care Dr> Georgie on 07/09/2022, JP drain was removed.    09/29/2022 Imaging   MRI pelvis without contrast showed  Interval abdominoperitoneal resection since prior MRI. Decreased small fluid collection in the surgical bed compared with more recent CT, consistent with resolving postoperative fluid collection or abscess.   Bulky rounded  presacral mass, which shows diffuse restricted diffusion, without significant change in size since most recent CT of 06/26/2022. This raises suspicion for recurrent carcinoma over post treatment changes. Recommend correlation with CEA level, and consider tissue sampling or PET-CT.   Stable enlarged right pelvic sidewall lymph node. No new or increased adenopathy identified.   10/09/2022 Imaging   CT chest with contrast showed 1. Unchanged 0.4 cm fissural nodule of the anterior left lower lobe. This is almost certainly a benign fissural lymph node. No new or suspicious pulmonary nodules. 2. Moderate emphysema and diffuse bilateral bronchial wall thickening. 3. Coronary artery disease.   10/13/2022 Imaging   CT abdomen pelvis with contrast showed 1. Complex collection persists in the lower pelvis, extending from the anal verge upwards into the presacral space and anteriorly from the presacral space to the bladder dome, not significantly changed in size or extent compared to the earlier CT of 06/26/2022. This is most likely a combination of a postoperative seroma and/or chronic phlegmon/abscess versus recurrent abscess. 2. Bladder walls are thick walled/edematous. Given the contiguity of the presacral collection and the bladder dome, this is highly suspicious for a related bladder wall infection and possibly secondary to colovesical fistula. Recommend correlation with urinalysis. 3. No evidence of bowel obstruction. LEFT lower abdominal wall colostomy, without obstruction or inflammatory change. 4. No free intraperitoneal air   10/13/2022 [facility] Admission   Hospitalized due to fever and rectal pain. Urine culture showed mixed urogenital flora. IR CT guided Aspiration of abscess showed Streptococcal Viridans. He was placed back on Augmentin   10/14/2023 CT read by [facility] radiology Postsurgical changes of abdominoperineal resection. Interval enlargement of  fluid collection in the surgical bed. Multiple  foci of enhancing tissue at  the margins of this fluid collection, increased from prior study, suspicious for local recurrence.   Prominent retroperitoneal lymph nodes, some which are increased in size from prior study. These are indeterminate and may be either reactive or represent metastatic disease. Recommend continued attention on follow-up.   Centrally hypoattenuating right obturator lymph node, unchanged in size,  consistent with treated disease.   Circumferential bladder wall thickening and irregularity, most pronounced on the left side. This may be reactive in the setting of adjacent pelvic inflammatory changes. Correlate with urinalysis if there is clinical  concern for urinary tract infection.    11/14/2022 Imaging   MRI pelvis w wo contrast  1. Status post abdominoperineal resection with left lower quadrant end colostomy. 2. Compared to prior CT and MR, no significant change in appearance of heterogeneous, rim enhancing presacral and low pelvic soft tissue and fluid. Largest heterogeneously enhancing conglomerate of fluid and soft tissue appears to closely involve the anteriorly abutting the seminal vesicles and measures 3.0 x 2.9 cm in largest axial dimension. This extends to superiorly contact the posterior bladder dome and inferiorly towards the gluteal cleft. 3. Discrete fluid component at the most inferior extent measuring 2.4 x 1.3 cm. This is markedly diminished in volume compared to more remote previous examinations, for example 06/26/2022. 4. Constellation of findings is highly concerning for locally recurrent rectal malignancy with or without superimposed infection. Presence or absence of infection at this time is not established by MR. 5. Additional unchanged hemorrhagic or proteinaceous fluid collection in the right hemipelvis measuring 3.2 x 2.6 cm most consistent with postoperative hematoma or seroma. 6. Unchanged thickening of the urinary bladder wall. As previously reported,  fistula or involvement of bladder wall by malignancy not excluded.   12/09/2022 Procedure   CT guided biopsy of the pelvic mass showed Moderately differentiated adenocarcinoma with dirty necrosis, morphologically identical to patient's prior rectal adenocarcinoma.   [facility] NGS xT 648 panel showed MLH3 start loss,  BCORL1 frame shift, TP53 splice region varian, APC stop gain,  TMB 7.9, MS stable, KRAS/BRAF/NRAS negative.   [facility] N[facility] xR showed no gene arrangement or reportable altered splicing events in RNA sequencing      01/07/2023 -  Chemotherapy   Xeloda  825 mg/m2 twice daily  with radiation   02/11/2023 Imaging   CT abdomen pelvis w contrast  1. Examination is positive for small bowel obstruction. Transition point is identified within the left iliac fossa. Distal to the transition point the small bowel loops exhibit mucosal enhancement and wall thickening concerning for enteritis. 2. There is new right-sided hydronephrosis and hydroureter up to the level of the bifurcation of right common iliac artery. The distal right ureter appears closely associated with the previously characterized tracer avid presacral soft tissue mass. Cannot exclude obstructive uropathy secondary to tumor involvement. 3. Similar appearance of presacral soft tissue mass. This was tracer avid on the recent PET-CT from 12/17/2022, and concerning for locally recurrent tumor. 4. Unchanged diffuse circumferential wall thickening involving the urinary bladder. 5. Aortic Atherosclerosis    04/17/2023 - 07/29/2023 Chemotherapy   Patient is on Treatment Plan : COLORECTAL FOLFIRI + Panitumumab  q14d     08/14/2023 Imaging   CT chest abdomen pelvis w contrast showed  Heterogeneous central pelvic mass in the area of the rectum overall similar configuration to the previous CT scan. Again the lesion abuts the bladder in the area of the seminal vesicles. Extension up along the presacral space. Surgical changes from  diverting colostomy.   Essentially stable right pelvic sidewall soft tissue mass or nodal enlargement. No new nodal enlargement elsewhere in the chest, abdomen or pelvis.   Fatty liver infiltration.   Subtle nodular opacity along the right lung base. Infiltrates possible. Please correlate with symptoms and recommend follow-up.   Incidental right lower lobe segmental and lobar pulmonary embolism.  09/23/2023 -  Chemotherapy   Patient is on Treatment Plan : COLORECTAL Xeloda  + Panitumumab  q21d     12/10/2023 Imaging   CT chest abdomen pelvis w contrast showed 1. Similar appearance of soft tissue mass within the posterior pelvis in the region of the rectum. This is stable to mildly decreased in the interval. As noted previously tumor extends along the presacral soft tissue space and abuts the posterior dome of bladder and posterior bladder base. 2. Stable appearance of soft tissue mass/enlarged lymph node within the right posterior pelvic sidewall. 3. No new or progressive disease identified. 4.  Aortic Atherosclerosis (ICD10-I70.0).       INTERVAL HISTORY Barry [name]. is a 58 y.o. male who has above history reviewed by me today presents for follow up visit for management of recurrent locally advanced rectal cancer. Denies fever chills, rectal pain. Denies rectal discharge.   No diarrhea after each treatments.  He has loose BM before treatments, this is a chronic issue for him.  Denies nausea vomiting or abdominal pain.  No leg swelling.  He can not afford copay for Xaretlo 10mg  daily.  + hemoptysis, small [dime size] of blood. Denies cough or SOB      Review of Systems  Constitutional:  Positive for fatigue. Negative for appetite change, chills, fever and unexpected weight change.  HENT:   Negative for hearing loss and voice change.   Eyes:  Negative for eye problems and icterus.  Respiratory:  Positive for hemoptysis. Negative for chest tightness, cough and  shortness of breath.   Cardiovascular:  Negative for chest pain and leg swelling.  Gastrointestinal:  Negative for abdominal distention, abdominal pain, blood in stool, constipation, diarrhea and rectal pain.  Endocrine: Negative for hot flashes.  Genitourinary:  Negative for difficulty urinating, dysuria and frequency.   Musculoskeletal:  Negative for arthralgias.  Skin:  Negative for itching and rash.  Neurological:  Negative for light-headedness and numbness.  Hematological:  Negative for adenopathy. Does not bruise/bleed easily.  Psychiatric/Behavioral:  Negative for confusion.     MEDICAL HISTORY:  Past Medical History:  Diagnosis Date   Headache    Left shoulder pain    Rectal cancer (HCC)     SURGICAL HISTORY: Past Surgical History:  Procedure Laterality Date   COLON SURGERY     COLONOSCOPY WITH PROPOFOL  N/A 02/06/2021   Procedure: COLONOSCOPY WITH PROPOFOL ;  Surgeon: Unk Corinn Skiff, MD;  Location: [facility] ENDOSCOPY;  Service: Gastroenterology;  Laterality: N/A;   PORTA CATH INSERTION N/A 02/27/2021   Procedure: PORTA CATH INSERTION;  Surgeon: Marea Selinda RAMAN, MD;  Location: [facility] INVASIVE CV LAB;  Service: Cardiovascular;  Laterality: N/A;   ROTATOR CUFF REPAIR Left     SOCIAL HISTORY: Social History   Socioeconomic History   Marital status: Married    Spouse name: Not on file   Number of children: Not on file   Years of education: Not on file   Highest education level: Not on file  Occupational History   Not on file  Tobacco Use   Smoking status: Former    Current packs/day: 0.00    Average packs/day: 1 pack/day for 10.0 years (10.0 ttl pk-yrs)    Types: Cigars, Cigarettes    Start date: 02/05/2011    Quit date: 02/04/2021    Years since quitting: 3.0   Smokeless tobacco: Never  Substance and Sexual Activity   Alcohol use: Not Currently   Drug use: No   Sexual activity: Not on  file  Other Topics Concern   Not on file  Social History Narrative   Not on  file   Social Drivers of Health   Financial Resource Strain: High Risk (02/13/2023)   Received from [facility]   Overall Financial Resource Strain (CARDIA)    Difficulty of Paying Living Expenses: Hard  Food Insecurity: No Food Insecurity (02/13/2023)   Received from [facility]   Hunger Vital Sign    Within the past 12 months, you worried that your food would run out before you got the money to buy more.: Never true    Within the past 12 months, the food you bought just didn't last and you didn't have money to get more.: Never true  Transportation Needs: No Transportation Needs (02/13/2023)   Received from [facility] - Transportation    In the past 12 months, has lack of transportation kept you from medical appointments or from getting medications?: No    Lack of Transportation (Non-Medical): No  Physical Activity: Inactive (11/03/2022)   Exercise Vital Sign    Days of Exercise per Week: 0 days    Minutes of Exercise per Session: 0 min  Stress: Stress Concern Present (11/03/2022)   [employer] of Occupational Health - Occupational Stress Questionnaire    Feeling of Stress : Rather much  Social Connections: Moderately Isolated (11/03/2022)   Social Connection and Isolation Panel    Frequency of Communication with Friends and Family: Twice a week    Frequency of Social Gatherings with Friends and Family: Three times a week    Attends Religious Services: 1 to 4 times per year    Active Member of Clubs or Organizations: No    Attends Banker Meetings: Never    Marital Status: Separated  Intimate Partner Violence: Not At Risk (11/03/2022)   Humiliation, Afraid, Rape, and Kick questionnaire    Fear of Current or Ex-Partner: No    Emotionally Abused: No    Physically Abused: No    Sexually Abused: No    FAMILY HISTORY: Family History  Problem Relation Age of Onset   Cancer Sister    Diabetes Mother     Cancer Maternal Grandmother    Cancer Paternal Grandmother     ALLERGIES:  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  MEDICATIONS:  Current Outpatient Medications  Medication Sig Dispense Refill   calcium -vitamin D (OSCAL WITH D) 500-5 MG-MCG tablet Take 2 tablets by mouth daily. 60 tablet 3   capecitabine  (XELODA ) 150 MG tablet TAKE 1 TABLET BY MOUTH 2 TIMES A DAY AFTER A MEAL FOR 14 DAYS ON, THEN 7 DAYS OFF, REPEAT EVERY 21 DAYS. (TOTAL DOSE OF 1,650 MG 2 TIMES A DAY) 28 tablet 1   capecitabine  (XELODA ) 500 MG tablet TAKE 3 TABLETS BY MOUTH 2 TIMES A DAY AFTER A MEAL FOR 14 DAYS ON, THEN 7 DAYS OFF, REPEAT EVERY 21 DAYS. (TOTAL DOSE OF 1,650 MG 2 TIMES A DAY) 84 tablet 1   citalopram  (CELEXA ) 10 MG tablet Take 1 tablet (10 mg total) by mouth daily. 30 tablet 3   clindamycin  (CLINDAGEL ) 1 % gel Apply topically 2 (two) times daily. 30 g 4   lidocaine -prilocaine  (EMLA ) cream Apply 1 Application topically as needed. Apply small amount to port and cover with saran wrap 1-2 hours prior to port access 30 g 2   loperamide  (IMODIUM ) 2 MG capsule Take 1 capsule (2 mg total) by mouth See admin  instructions. 90 capsule 2   magnesium  chloride (SLOW-MAG) 64 MG TBEC SR tablet Take 1 tablet (64 mg total) by mouth daily. 90 tablet 1   ondansetron  (ZOFRAN ) 8 MG tablet Take 1 tablet (8 mg total) by mouth every 8 (eight) hours as needed for nausea or vomiting. 90 tablet 1   potassium chloride  SA (KLOR-CON  M) 20 MEQ tablet Take 2 tablets (40 mEq total) by mouth in the morning and take 1 tablet ( ) in the evening. 60 tablet 2   prochlorperazine  (COMPAZINE ) 10 MG tablet Take 1 tablet (10 mg total) by mouth every 6 (six) hours as needed for nausea or vomiting. 90 tablet 1   rivaroxaban  (XARELTO ) 10 MG TABS tablet Take 1 tablet (10 mg total) by mouth daily. 30 tablet 5   senna (SENOKOT) 8.6 MG TABS tablet Take 2 tablets (17.2 mg total) by mouth daily. 100 tablet 0   traZODone  (DESYREL ) 50 MG tablet Take 1 tablet (50 mg  total) by mouth at bedtime as needed for sleep. 30 tablet 3   losartan  (COZAAR ) 25 MG tablet Take 1 tablet (25 mg total) by mouth daily. 90 tablet 1   No current facility-administered medications for this visit.   Facility-Administered Medications Ordered in Other Visits  Medication Dose Route Frequency Provider Last Rate Last Admin   0.9 %  sodium chloride  infusion   Intravenous Continuous Babara Call, MD       heparin  lock flush 100 unit/mL  500 Units Intravenous Once Babara Call, MD       heparin  lock flush 100 unit/mL  500 Units Intracatheter Once PRN Babara Call, MD       panitumumab  (VECTIBIX ) 700 mg in sodium chloride  0.9 % 100 mL chemo infusion  9 mg/kg (Order-Specific) Intravenous Once Babara Call, MD       sodium chloride  flush (NS) 0.9 % injection 10 mL  10 mL Intracatheter PRN Babara Call, MD         PHYSICAL EXAMINATION: ECOG PERFORMANCE STATUS: 1 - Symptomatic but completely ambulatory Vitals:   03/03/24 0821  BP: (!) 129/95  Pulse: 93  Resp: 19  Temp: 98.9 F (37.2 C)  SpO2: 99%     Filed Weights   03/03/24 0821  Weight: 181 lb 6.4 oz (82.3 kg)       Physical Exam HENT:     Head: Normocephalic and atraumatic.   Eyes:     General: No scleral icterus.   Cardiovascular:     Rate and Rhythm: Normal rate and regular rhythm.     Heart sounds: Normal heart sounds.  Pulmonary:     Effort: Pulmonary effort is normal. No respiratory distress.     Breath sounds: No wheezing.     Comments: Decreased breath sound bilaterally.  Abdominal:     General: Bowel sounds are normal. There is no distension.     Palpations: Abdomen is soft.     Comments: Colostomy bag  Genitourinary:    Comments: History of APR,  Musculoskeletal:        General: No deformity. Normal range of motion.     Cervical back: Normal range of motion and neck supple.   Skin:    General: Skin is warm and dry.     Findings: No erythema.   Neurological:     Mental Status: He is alert and oriented to  person, place, and time. Mental status is at baseline.     Cranial Nerves: No cranial nerve deficit.   Psychiatric:  Mood and Affect: Mood normal.     LABORATORY DATA:  I have reviewed the data as listed    Latest Ref Rng & Units 03/03/2024    8:03 AM 02/11/2024    8:35 AM 01/06/2024    8:54 AM  CBC  WBC 4.0 - 10.5 K/uL 5.0  5.6  4.2   Hemoglobin 13.0 - 17.0 g/dL 84.1  85.3  83.8   Hematocrit 39.0 - 52.0 % 45.0  41.4  45.4   Platelets 150 - 400 K/uL 205  204  188       Latest Ref Rng & Units 03/03/2024    8:03 AM 02/11/2024    8:35 AM 01/06/2024    8:54 AM  CMP  Glucose 70 - 99 mg/dL 863  858  837   BUN 6 - 20 mg/dL 9  9  9    Creatinine 0.61 - 1.24 mg/dL 9.00  9.25  9.06   Sodium 135 - 145 mmol/L 140  138  138   Potassium 3.5 - 5.1 mmol/L 3.5  3.4  3.2   Chloride 98 - 111 mmol/L 105  107  104   CO2 22 - 32 mmol/L 25  24  25    Calcium  8.9 - 10.3 mg/dL 9.2  8.1  8.6   Total Protein 6.5 - 8.1 g/dL 7.3  6.8  7.2   Total Bilirubin 0.0 - 1.2 mg/dL 0.8  0.6  0.7   Alkaline Phos 38 - 126 U/L 99  93  83   AST 15 - 41 U/L 20  18  24    ALT 0 - 44 U/L 18  17  23       RADIOGRAPHIC STUDIES: I have personally reviewed the radiological images as listed and agreed with the findings in the report. CT CHEST ABDOMEN PELVIS W CONTRAST Result Date: 12/16/2023 CLINICAL DATA:  Restaging rectal cancer.  * Tracking Code: BO * EXAM: CT CHEST, ABDOMEN, AND PELVIS WITH CONTRAST TECHNIQUE: Multidetector CT imaging of the chest, abdomen and pelvis was performed following the standard protocol during bolus administration of intravenous contrast. RADIATION DOSE REDUCTION: This exam was performed according to the departmental dose-optimization program which includes automated exposure control, adjustment of the mA and/or kV according to patient size and/or use of iterative reconstruction technique. CONTRAST:  OMNIPAQUE  IOHEXOL  300 MG/ML  SOLN COMPARISON:  08/10/2023 FINDINGS: CT CHEST FINDINGS  Cardiovascular: Heart size appears within normal limits. No pericardial effusion. Aortic atherosclerosis. Mediastinum/Nodes: Thyroid gland, trachea and esophagus are unremarkable. No enlarged axillary, supraclavicular, mediastinal or hilar lymph nodes. Lungs/Pleura: No pleural effusion, airspace consolidation, atelectasis or pneumothorax. Centrilobular emphysema with diffuse bronchial wall thickening noted. Perifissural nodule along the lateral aspect of the oblique fissure measures 5 mm and most likely reflects a benign intrapulmonary lymph node. Unchanged from previous exam, image 102/4. Musculoskeletal: No chest wall mass or suspicious bone lesions identified. CT ABDOMEN PELVIS FINDINGS Hepatobiliary: Small subcapsular cyst along the inferior medial margin of the right lobe of liver is unchanged measuring 1.1 cm, image 77/2. Gallbladder appears normal. No bile duct dilatation. Pancreas: Unremarkable. No pancreatic ductal dilatation or surrounding inflammatory changes. Spleen: Normal in size without focal abnormality. Adrenals/Urinary Tract: Normal adrenal glands. No nephrolithiasis, hydronephrosis or suspicious mass. Stomach/Bowel: Stomach appears normal. Left lower quadrant colostomy. No pathologic dilatation of the large or small bowel loops. The appendix is visualized and appears normal. Soft tissue mass within the posterior pelvis is again noted. On the axial images this measures 4.9 x 4.8 cm,, image  125/2. Previously 5.3 x 4.9 cm. Cephalocaudal extension within the presacral soft tissue space measures 9.3 cm, image 80/6. This is compared with 10.3 cm previously Similar appearance of tumor extension from the presacral soft tissue space to the posterior dome of the bladder. Tumor also appears to abut the posterior bladder base at the level of the seminal vesicles. Vascular/Lymphatic: Aortic atherosclerosis. No abdominal adenopathy identified. Within the right posterior pelvic sidewall there is a soft tissue  mass/enlarged lymph node which measures 3.1 x 2.6 cm, image 121/2. On the previous exam this measured 3.3 by 2.6 cm Reproductive: Poor definition of the prostate gland and seminal vesicles. Other: There is no ascites or focal fluid collections. No signs of pneumoperitoneum. Musculoskeletal: No acute or significant osseous findings. IMPRESSION: 1. Similar appearance of soft tissue mass within the posterior pelvis in the region of the rectum. This is stable to mildly decreased in the interval. As noted previously tumor extends along the presacral soft tissue space and abuts the posterior dome of bladder and posterior bladder base. 2. Stable appearance of soft tissue mass/enlarged lymph node within the right posterior pelvic sidewall. 3. No new or progressive disease identified. 4.  Aortic Atherosclerosis (ICD10-I70.0). Electronically Signed   By: Waddell Calk M.D.   On: 12/16/2023 10:59

## 2024-03-03 NOTE — Assessment & Plan Note (Signed)
?   Obtain CTA chest

## 2024-03-03 NOTE — Assessment & Plan Note (Signed)
 December 2024 right lower lobe segmental and lobar pulmonary embolism On  Xarelto  to 10mg  daily. - he reports that he can not afford copay.  Obtain CT chest angiogram - hemoptysis.

## 2024-03-03 NOTE — Assessment & Plan Note (Signed)
 Chemotherapy plan as listed

## 2024-03-03 NOTE — Assessment & Plan Note (Signed)
 Continue calcium 1000mg  daily.  Check Vitamin D level

## 2024-03-03 NOTE — Assessment & Plan Note (Signed)
 locally advanced rectal cancer-Stage IIIB, s/p TNT neoadjuvant protocol, concurrent Xeloda  and radiation-Finished 08/27/2021, s/p  APR,rectal adenocarcinoma, [id number]. Positive radial margin due to perforation. Biopsy proven recurrent rectal cancer, locally advanced disease, possible colovesical fistula; no distant metastatic disease on PET- per [name] Surgeon Dr. Lenore Rafter, recurrence is not resectable. --> June 2024 S/p concurrent Xeloda  825 mg/m2 twice daily  with radiation --> July 2024 MRI pelvis w wo contrast showed persistent disease. Disease is not resectable per colorectal surgeon --> FOLFIRI Panitumumab  --> Dec 2025 partial response. --> per pt's preference switched to Xeloda  + Panitumumab . [Pt declined 5-FU pump] Labs are reviewed and discussed with patient.  Proceed with cycle 5 Xeloda  850 mg/m2 twice daily on days 1 to 14 of a 21-day cycle plus Panitumumab  Q3 weeks. [Panel study regimen].  He tolerates regimen well.  Repeat CT in July

## 2024-03-03 NOTE — Assessment & Plan Note (Signed)
 continue slow Mag 1 tab daily.

## 2024-03-03 NOTE — Assessment & Plan Note (Signed)
 Continue  losartan  25mg  daily. Recommend patient to monitor BP at home.  Follow up with PCP

## 2024-03-04 ENCOUNTER — Ambulatory Visit: Attending: Oncology

## 2024-03-10 ENCOUNTER — Ambulatory Visit: Admission: RE | Admit: 2024-03-10 | Source: Ambulatory Visit

## 2024-03-15 ENCOUNTER — Other Ambulatory Visit: Payer: Self-pay

## 2024-03-15 ENCOUNTER — Encounter: Payer: Self-pay | Admitting: Oncology

## 2024-03-16 ENCOUNTER — Encounter: Payer: Self-pay | Admitting: Oncology

## 2024-03-18 ENCOUNTER — Ambulatory Visit

## 2024-03-18 ENCOUNTER — Other Ambulatory Visit

## 2024-03-18 ENCOUNTER — Ambulatory Visit: Admitting: Oncology

## 2024-03-24 ENCOUNTER — Inpatient Hospital Stay

## 2024-03-24 ENCOUNTER — Inpatient Hospital Stay: Attending: Oncology

## 2024-03-24 ENCOUNTER — Encounter: Payer: Self-pay | Admitting: Oncology

## 2024-03-24 ENCOUNTER — Inpatient Hospital Stay (HOSPITAL_BASED_OUTPATIENT_CLINIC_OR_DEPARTMENT_OTHER): Admitting: Oncology

## 2024-03-24 VITALS — BP 140/94 | HR 79 | Resp 18

## 2024-03-24 VITALS — BP 125/98 | HR 89 | Temp 97.5°F | Resp 20 | Wt 182.3 lb

## 2024-03-24 DIAGNOSIS — F17290 Nicotine dependence, other tobacco product, uncomplicated: Secondary | ICD-10-CM | POA: Insufficient documentation

## 2024-03-24 DIAGNOSIS — Z5112 Encounter for antineoplastic immunotherapy: Secondary | ICD-10-CM | POA: Insufficient documentation

## 2024-03-24 DIAGNOSIS — Z86711 Personal history of pulmonary embolism: Secondary | ICD-10-CM | POA: Diagnosis not present

## 2024-03-24 DIAGNOSIS — Z79899 Other long term (current) drug therapy: Secondary | ICD-10-CM | POA: Insufficient documentation

## 2024-03-24 DIAGNOSIS — E559 Vitamin D deficiency, unspecified: Secondary | ICD-10-CM | POA: Insufficient documentation

## 2024-03-24 DIAGNOSIS — Z933 Colostomy status: Secondary | ICD-10-CM | POA: Insufficient documentation

## 2024-03-24 DIAGNOSIS — Z923 Personal history of irradiation: Secondary | ICD-10-CM | POA: Insufficient documentation

## 2024-03-24 DIAGNOSIS — C20 Malignant neoplasm of rectum: Secondary | ICD-10-CM

## 2024-03-24 DIAGNOSIS — Z7901 Long term (current) use of anticoagulants: Secondary | ICD-10-CM | POA: Insufficient documentation

## 2024-03-24 DIAGNOSIS — I10 Essential (primary) hypertension: Secondary | ICD-10-CM | POA: Diagnosis not present

## 2024-03-24 DIAGNOSIS — Z5111 Encounter for antineoplastic chemotherapy: Secondary | ICD-10-CM

## 2024-03-24 LAB — CMP (CANCER CENTER ONLY)
ALT: 19 U/L (ref 0–44)
AST: 22 U/L (ref 15–41)
Albumin: 3.8 g/dL (ref 3.5–5.0)
Alkaline Phosphatase: 86 U/L (ref 38–126)
Anion gap: 7 (ref 5–15)
BUN: 8 mg/dL (ref 6–20)
CO2: 25 mmol/L (ref 22–32)
Calcium: 8.6 mg/dL — ABNORMAL LOW (ref 8.9–10.3)
Chloride: 106 mmol/L (ref 98–111)
Creatinine: 0.79 mg/dL (ref 0.61–1.24)
GFR, Estimated: >60 mL/min (ref >60)
Glucose, Bld: 123 mg/dL — ABNORMAL HIGH (ref 70–99)
Potassium: 3.6 mmol/L (ref 3.5–5.1)
Sodium: 138 mmol/L (ref 135–145)
Total Bilirubin: 0.8 mg/dL (ref 0.0–1.2)
Total Protein: 7.2 g/dL (ref 6.5–8.1)

## 2024-03-24 LAB — CBC WITH DIFFERENTIAL (CANCER CENTER ONLY)
Abs Immature Granulocytes: 0.02 K/uL (ref 0.00–0.07)
Basophils Absolute: 0 K/uL (ref 0.0–0.1)
Basophils Relative: 1 %
Eosinophils Absolute: 0.1 K/uL (ref 0.0–0.5)
Eosinophils Relative: 3 %
HCT: 46.3 % (ref 39.0–52.0)
Hemoglobin: 15.7 g/dL (ref 13.0–17.0)
Immature Granulocytes: 1 %
Lymphocytes Relative: 39 %
Lymphs Abs: 1.6 K/uL (ref 0.7–4.0)
MCH: 31.8 pg (ref 26.0–34.0)
MCHC: 33.9 g/dL (ref 30.0–36.0)
MCV: 93.7 fL (ref 80.0–100.0)
Monocytes Absolute: 0.7 K/uL (ref 0.1–1.0)
Monocytes Relative: 17 %
Neutro Abs: 1.7 K/uL (ref 1.7–7.7)
Neutrophils Relative %: 39 %
Platelet Count: 204 K/uL (ref 150–400)
RBC: 4.94 MIL/uL (ref 4.22–5.81)
RDW: 14.3 % (ref 11.5–15.5)
WBC Count: 4.2 K/uL (ref 4.0–10.5)
nRBC: 0 % (ref 0.0–0.2)

## 2024-03-24 LAB — MAGNESIUM: Magnesium: 1.8 mg/dL (ref 1.7–2.4)

## 2024-03-24 MED ORDER — HEPARIN SOD (PORK) LOCK FLUSH 100 UNIT/ML IV SOLN
500.0000 [IU] | Freq: Once | INTRAVENOUS | Status: AC | PRN
  Administered 2024-03-24: 500 [IU]
  Filled 2024-03-24: qty 5

## 2024-03-24 MED ORDER — SODIUM CHLORIDE 0.9 % IV SOLN
INTRAVENOUS | Status: DC
Start: 2024-03-24 — End: 2024-03-24
  Filled 2024-03-24: qty 250

## 2024-03-24 MED ORDER — SODIUM CHLORIDE 0.9 % IV SOLN
9.0000 mg/kg | Freq: Once | INTRAVENOUS | Status: AC
  Administered 2024-03-24: 700 mg via INTRAVENOUS
  Filled 2024-03-24: qty 15

## 2024-03-24 MED ORDER — VITAMIN D 50 MCG (2000 UT) PO CAPS: 1.0000 | ORAL_CAPSULE | Freq: Every day | ORAL | Status: AC

## 2024-03-24 NOTE — Assessment & Plan Note (Signed)
 Recommend patient to take vitamin D  2000 units daily

## 2024-03-24 NOTE — Progress Notes (Signed)
 Hematology/Oncology Progress note [phone] [phone]      CHIEF COMPLAINTS/REASON FOR VISIT:  Follow up for recurrent rectal cancer treatment  ASSESSMENT & PLAN:   Cancer Staging  Rectal cancer [facility]) Staging form: Colon and Rectum, AJCC 8th Edition - Clinical stage from 02/08/2021: Stage IIIB (cT4a, cN1, cM0) - Signed by Babara Call, MD on 03/06/2021   Rectal cancer [facility]) locally advanced rectal cancer-Stage IIIB, s/p TNT neoadjuvant protocol, concurrent Xeloda  and radiation-Finished 08/27/2021, s/p  APR,rectal adenocarcinoma, ypT3N0. Positive radial margin due to perforation. Biopsy proven recurrent rectal cancer, locally advanced disease, possible colovesical fistula; no distant metastatic disease on PET- per [name] Surgeon Dr. Florie, recurrence is not resectable. --> June 2024 S/p concurrent Xeloda  825 mg/m2 twice daily  with radiation --> July 2024 MRI pelvis w wo contrast showed persistent disease. Disease is not resectable per colorectal surgeon --> FOLFIRI Panitumumab  --> Dec 2025 partial response. --> per pt's preference switched to Xeloda  + Panitumumab . [Pt declined 5-FU pump] Labs are reviewed and discussed with patient.  Proceed with cycle 6 Xeloda  850 mg/m2 twice daily on days 1 to 14 of a 21-day cycle plus Panitumumab  Q3 weeks. [Panel study regimen].  He tolerates regimen well.  Repeat CT in July  Encounter for antineoplastic chemotherapy Chemotherapy plan as listed  History of pulmonary embolism December 2024 right lower lobe segmental and lobar pulmonary embolism On  Xarelto  to 10mg  daily. - he reports that he can not afford copay.  Obtain CT chest angiogram - hemoptysis.   Hypertension Continue  losartan  25mg  daily. Recommend patient to monitor BP at home.  Follow up with PCP  Hypocalcemia Continue calcium  1000mg  daily.    Vitamin D  deficiency Recommend patient to take vitamin D  2000 units daily  Hypomagnesemia continue slow Mag 1 tab  daily.         No orders of the defined types were placed in this encounter.   Follow-up  3 weeks All questions were answered. The patient knows to call the clinic with any problems, questions or concerns.  Call Babara, MD, PhD [facility] Hematology Oncology 03/24/2024     HISTORY OF PRESENTING ILLNESS:   Barry [name]. is a  58 y.o.  male presents for rectal cancer Oncology History  Rectal cancer (HCC)  02/08/2021 Initial Diagnosis   Rectal cancer   02/05/2021-02/06/2021 patient was hospitalized due to generalized weakness, intermittent lightheadedness, weight loss and worsening constipation.  02/05/2021 CT abdomen showed concerning of severe rectal wall thickening and right internal iliac lymph node concerning for metastatic disease.  Patient was seen by gastroenterology and had colonoscopy which showed a circumferential fungating mass in the rectum.  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   02/14/2021-02/16/2021 hospitalized due to rectal bleeding and pain.   02/14/2021 CT showed perirectal fluid collection/gas concerning for infection with significant leukocytosis, anemia with hemoglobin of 6.8.  Patient received PRBC transfusion, IV antibiotics.  He underwent an IR guided placement of JP drain into the rectal abscess.  Discharged home with oral Augmentin . 02/20/2021 presented to ER with new skin opening and draining from left gluteus.  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  JP drain has been removed.  Patient was continued on Augmentin .   02/13/2021, PET scan showed locally advanced rectal cancer with billowing of the mesorectum now with low attenuation material that was not present on previous examination with extensive stranding and inflammation.   Bulky RIGHT pelvic sidewall/hypogastric lymph node outside of the mesorectum with stippled calcification  measuring 19 mm-no increased metabolic activity. Small LEFT hypogastric lymph node just peripheral  to the internal external bifurcation-SUV 3.2 High RIGHT internal just below or at the internal/common iliac transition, lymph node -SUV 4.8 LEFT high hypogastric lymph node  8 mm-SUV 3. Scattered lymph nodes throughout the retroperitoneum with low FDG uptake Bilateral inguinal lymph nodes largest on the RIGHT (image 248/3) 11 mm with a maximum SUV of 2.8 spiculated nodule in the LEFT upper lobe- 9 x 8 mm       02/14/2021, CT abdomen pelvis without contrast Showed perforated rectal mass with contained perforation with fluid and gas extending above and below the pelvic floor,potentially involving the sphincter complex and extending into LEFT ischial rectal fossa   02/20/2021, CT pelvis with contrast showed Large perirectal/perianal abscess has markedly decreased in size since placement of the percutaneous drain. There are residual gas-filled collections in the soft tissues and suspect there is a fistula or sinus tract between the rectal mass and the subcutaneous tissues. Soft tissue gas along the medial left buttock and concern for a cutaneous ulceration in this area. Large rectal mass with evidence for a large necrotic right pelvic lymph node.   His case is complicated with a perirectal abscess. Prior to onset of perirectal abscess, on his 02/05/2021 scan, he was noted to have right internal iliac lymph node 3 x 1 x 2.3 cm which is concerning for nodal disease.On Subsequent images it was difficult to distinguish whether lymphadenopathy was due to nodal disease versus acute inflammation. 03/07/2021 MRI pelvis cT3 N2. Due to the possible contained perforation on previous CT, possible cT4 disease.    02/27/2021 medi port placed by Dr.Dew 03/13/2021 reports right butt cheek and also left perianal area fullness.he was seen by Dr.Pabon urgently  and had right buttock abscess drained. Left perianal fullness was felt to be due to cancer.    02/08/2021 Cancer Staging   Staging form: Colon and Rectum, AJCC 8th  Edition - Clinical stage from 02/08/2021: Stage IIIB (cT4a, cN1, cM0) - Signed by Babara Call, MD on 03/06/2021 Stage prefix: Initial diagnosis   02/27/2021 Procedure   medi port placed by Dr.Dew   03/18/2021 - 07/05/2021 Chemotherapy    FOLFOX q14d x 4 months      07/17/2021 - 08/27/2021 Chemotherapy   Xeloda  concurrent with Radiation   11/18/2021 Surgery   patient is status post open APR with flap for rectal cancer. Pathology rectal adenomacarcinoma ypT3N0.  Positive margin: Radial (circumferential) or mesenteric: adjacent to perforation      01/27/2022 - 02/02/2022 Hospital Admission   Hospitalized at [facility] due to recurrent abscess.  US  guided drain into the pelvic abscess with return of 60 ml of purulent fluid. Cultures positive for staph aureus and strep agalactiae group b, fungal cultures negative.    02/25/2022 Imaging   CT chest angiogram with and without contrast, CT abdomen pelvis with and without contrast 1. Negative for acute pulmonary embolus.2. Emphysema. Further decrease in size of the previously noted irregular nodule in the left upper lobe which is now barely measurable today. No new suspicious lung nodules 3. Status post left lower quadrant colostomy. Further decrease in size since comparison exam from May of the rim enhancing gas and fluid collection at the pelvic surgical bed extending from the perineum superiorly into the pelvis 4. Slightly thickened appearance of terminal ileal small bowel loops in the pelvis with mild stranding suggesting small bowel inflammatory process. 5. Stable enlarged right pelvic sidewall lymph node  06/02/2022 Imaging   CT chest abdomen pelvis w contrast 1. Status post abdominal perineal resection with descending colostomy. 2. At the site of pelvic fluid and gas collection on 02/25/2022, there is residual, decreased presacral soft tissue fullness, without drainable collection. 3. Similar right obturator nodal metastasis.4.  No acute process or  evidence of metastatic disease in the chest. 5. Age advanced coronary artery atherosclerosis. Recommend assessment of coronary risk factors. 6. Aortic atherosclerosis and emphysema     06/26/2022 Imaging   CT abdomen pelvis with contrast showed 1. Interval increase in size of a lobulated partially imaged at least 7.8 x 3.8 x 12 cm abscess in the perianal region in a patient status post abdominal perineal resection and left lower lobe end colostomy formation. Finding extends to involve the pre sacral region up to the urinary dome level. Associated posterior urinary bladder wall thickening with lack of intraperitoneal fat plane between the presacral soft tissue thickening/abscess formation suggestive of possible fistulization and invasion of the posterior bladder wall. Underlying recurrent malignancy is not excluded. 2. Stable 2.7 cm right pelvic sidewall lymph node. 3.  Aortic Atherosclerosis   06/26/2022 - 06/27/2022 Hospital Admission   He went to [facility] ER and CT showed recurrent abscess. He was transferred to [facility] due to recurrent abscess, treated with IV vanc/zosyn , IR was consulted and drainage tube was placed. Wound grew up Strep viridans. Patient was seen by ID at [facility] discharged with Augmentin  for 2 weeks.   He was seen by wound care Dr> Georgie on 07/09/2022, JP drain was removed.    09/29/2022 Imaging   MRI pelvis without contrast showed  Interval abdominoperitoneal resection since prior MRI. Decreased small fluid collection in the surgical bed compared with more recent CT, consistent with resolving postoperative fluid collection or abscess.   Bulky rounded presacral mass, which shows diffuse restricted diffusion, without significant change in size since most recent CT of 06/26/2022. This raises suspicion for recurrent carcinoma over post treatment changes. Recommend correlation with CEA level, and consider tissue sampling or PET-CT.   Stable enlarged right pelvic sidewall lymph  node. No new or increased adenopathy identified.   10/09/2022 Imaging   CT chest with contrast showed 1. Unchanged 0.4 cm fissural nodule of the anterior left lower lobe. This is almost certainly a benign fissural lymph node. No new or suspicious pulmonary nodules. 2. Moderate emphysema and diffuse bilateral bronchial wall thickening. 3. Coronary artery disease.   10/13/2022 Imaging   CT abdomen pelvis with contrast showed 1. Complex collection persists in the lower pelvis, extending from the anal verge upwards into the presacral space and anteriorly from the presacral space to the bladder dome, not significantly changed in size or extent compared to the earlier CT of 06/26/2022. This is most likely a combination of a postoperative seroma and/or chronic phlegmon/abscess versus recurrent abscess. 2. Bladder walls are thick walled/edematous. Given the contiguity of the presacral collection and the bladder dome, this is highly suspicious for a related bladder wall infection and possibly secondary to colovesical fistula. Recommend correlation with urinalysis. 3. No evidence of bowel obstruction. LEFT lower abdominal wall colostomy, without obstruction or inflammatory change. 4. No free intraperitoneal air   10/13/2022 [facility] Admission   Hospitalized due to fever and rectal pain. Urine culture showed mixed urogenital flora. IR CT guided Aspiration of abscess showed Streptococcal Viridans. He was placed back on Augmentin   10/14/2023 CT read by [facility] radiology Postsurgical changes of abdominoperineal resection. Interval enlargement of  fluid collection  in the surgical bed. Multiple foci of enhancing tissue at  the margins of this fluid collection, increased from prior study, suspicious for local recurrence.   Prominent retroperitoneal lymph nodes, some which are increased in size from prior study. These are indeterminate and may be either reactive or represent metastatic disease. Recommend continued  attention on follow-up.   Centrally hypoattenuating right obturator lymph node, unchanged in size, consistent with treated disease.   Circumferential bladder wall thickening and irregularity, most pronounced on the left side. This may be reactive in the setting of adjacent pelvic inflammatory changes. Correlate with urinalysis if there is clinical  concern for urinary tract infection.    11/14/2022 Imaging   MRI pelvis w wo contrast  1. Status post abdominoperineal resection with left lower quadrant end colostomy. 2. Compared to prior CT and MR, no significant change in appearance of heterogeneous, rim enhancing presacral and low pelvic soft tissue and fluid. Largest heterogeneously enhancing conglomerate of fluid and soft tissue appears to closely involve the anteriorly abutting the seminal vesicles and measures 3.0 x 2.9 cm in largest axial dimension. This extends to superiorly contact the posterior bladder dome and inferiorly towards the gluteal cleft. 3. Discrete fluid component at the most inferior extent measuring 2.4 x 1.3 cm. This is markedly diminished in volume compared to more remote previous examinations, for example 06/26/2022. 4. Constellation of findings is highly concerning for locally recurrent rectal malignancy with or without superimposed infection. Presence or absence of infection at this time is not established by MR. 5. Additional unchanged hemorrhagic or proteinaceous fluid collection in the right hemipelvis measuring 3.2 x 2.6 cm most consistent with postoperative hematoma or seroma. 6. Unchanged thickening of the urinary bladder wall. As previously reported, fistula or involvement of bladder wall by malignancy not excluded.   12/09/2022 Procedure   CT guided biopsy of the pelvic mass showed Moderately differentiated adenocarcinoma with dirty necrosis, morphologically identical to patient's prior rectal adenocarcinoma.   [facility] NGS xT 648 panel showed MLH3 start loss,  BCORL1  frame shift, TP53 splice region varian, APC stop gain,  TMB 7.9, MS stable, KRAS/BRAF/NRAS negative.   [facility] NGS xR showed no gene arrangement or reportable altered splicing events in RNA sequencing      01/07/2023 -  Chemotherapy   Xeloda  825 mg/m2 twice daily  with radiation   02/11/2023 Imaging   CT abdomen pelvis w contrast  1. Examination is positive for small bowel obstruction. Transition point is identified within the left iliac fossa. Distal to the transition point the small bowel loops exhibit mucosal enhancement and wall thickening concerning for enteritis. 2. There is new right-sided hydronephrosis and hydroureter up to the level of the bifurcation of right common iliac artery. The distal right ureter appears closely associated with the previously characterized tracer avid presacral soft tissue mass. Cannot exclude obstructive uropathy secondary to tumor involvement. 3. Similar appearance of presacral soft tissue mass. This was tracer avid on the recent PET-CT from 12/17/2022, and concerning for locally recurrent tumor. 4. Unchanged diffuse circumferential wall thickening involving the urinary bladder. 5. Aortic Atherosclerosis    04/17/2023 - 07/29/2023 Chemotherapy   Patient is on Treatment Plan : COLORECTAL FOLFIRI + Panitumumab  q14d     08/14/2023 Imaging   CT chest abdomen pelvis w contrast showed  Heterogeneous central pelvic mass in the area of the rectum overall similar configuration to the previous CT scan. Again the lesion abuts the bladder in the area of the seminal vesicles. Extension up along  the presacral space. Surgical changes from diverting colostomy.   Essentially stable right pelvic sidewall soft tissue mass or nodal enlargement. No new nodal enlargement elsewhere in the chest, abdomen or pelvis.   Fatty liver infiltration.   Subtle nodular opacity along the right lung base. Infiltrates possible. Please correlate with symptoms and recommend follow-up.    Incidental right lower lobe segmental and lobar pulmonary embolism.     09/23/2023 -  Chemotherapy   Patient is on Treatment Plan : COLORECTAL Xeloda  + Panitumumab  q21d     12/10/2023 Imaging   CT chest abdomen pelvis w contrast showed 1. Similar appearance of soft tissue mass within the posterior pelvis in the region of the rectum. This is stable to mildly decreased in the interval. As noted previously tumor extends along the presacral soft tissue space and abuts the posterior dome of bladder and posterior bladder base. 2. Stable appearance of soft tissue mass/enlarged lymph node within the right posterior pelvic sidewall. 3. No new or progressive disease identified. 4.  Aortic Atherosclerosis (ICD10-I70.0).       INTERVAL HISTORY Barry [name]. is a 58 y.o. male who has above history reviewed by me today presents for follow up visit for management of recurrent locally advanced rectal cancer. Denies fever chills, rectal pain. Denies rectal discharge.   No diarrhea after each treatments.  He has loose BM before treatments, this is a chronic issue for him.  Denies nausea vomiting or abdominal pain.  No leg swelling.  He can not afford copay for Xaretlo 10mg  daily.  No additional hemoptysis Denies cough or SOB      Review of Systems  Constitutional:  Positive for fatigue. Negative for appetite change, chills, fever and unexpected weight change.  HENT:   Negative for hearing loss and voice change.   Eyes:  Negative for eye problems and icterus.  Respiratory:  Positive for hemoptysis. Negative for chest tightness, cough and shortness of breath.   Cardiovascular:  Negative for chest pain and leg swelling.  Gastrointestinal:  Negative for abdominal distention, abdominal pain, blood in stool, constipation, diarrhea and rectal pain.  Endocrine: Negative for hot flashes.  Genitourinary:  Negative for difficulty urinating, dysuria and frequency.   Musculoskeletal:  Negative for  arthralgias.  Skin:  Negative for itching and rash.  Neurological:  Negative for light-headedness and numbness.  Hematological:  Negative for adenopathy. Does not bruise/bleed easily.  Psychiatric/Behavioral:  Negative for confusion.     MEDICAL HISTORY:  Past Medical History:  Diagnosis Date   Headache    Left shoulder pain    Rectal cancer (HCC)     SURGICAL HISTORY: Past Surgical History:  Procedure Laterality Date   COLON SURGERY     COLONOSCOPY WITH PROPOFOL  N/A 02/06/2021   Procedure: COLONOSCOPY WITH PROPOFOL ;  Surgeon: Unk Corinn Skiff, MD;  Location: [facility] ENDOSCOPY;  Service: Gastroenterology;  Laterality: N/A;   PORTA CATH INSERTION N/A 02/27/2021   Procedure: PORTA CATH INSERTION;  Surgeon: Marea Selinda RAMAN, MD;  Location: [facility] INVASIVE CV LAB;  Service: Cardiovascular;  Laterality: N/A;   ROTATOR CUFF REPAIR Left     SOCIAL HISTORY: Social History   Socioeconomic History   Marital status: Married    Spouse name: Not on file   Number of children: Not on file   Years of education: Not on file   Highest education level: Not on file  Occupational History   Not on file  Tobacco Use   Smoking status: Former  Current packs/day: 0.00    Average packs/day: 1 pack/day for 10.0 years (10.0 ttl pk-yrs)    Types: Cigars, Cigarettes    Start date: 02/05/2011    Quit date: 02/04/2021    Years since quitting: 3.1   Smokeless tobacco: Never  Substance and Sexual Activity   Alcohol use: Not Currently   Drug use: No   Sexual activity: Not on file  Other Topics Concern   Not on file  Social History Narrative   Not on file   Social Drivers of Health   Financial Resource Strain: High Risk (02/13/2023)   Received from [facility]   Overall Financial Resource Strain (CARDIA)    Difficulty of Paying Living Expenses: Hard  Food Insecurity: No Food Insecurity (02/13/2023)   Received from [facility]   Hunger Vital Sign    Within the past  12 months, you worried that your food would run out before you got the money to buy more.: Never true    Within the past 12 months, the food you bought just didn't last and you didn't have money to get more.: Never true  Transportation Needs: No Transportation Needs (02/13/2023)   Received from [facility] - Transportation    In the past 12 months, has lack of transportation kept you from medical appointments or from getting medications?: No    Lack of Transportation (Non-Medical): No  Physical Activity: Inactive (11/03/2022)   Exercise Vital Sign    Days of Exercise per Week: 0 days    Minutes of Exercise per Session: 0 [name]  Stress: Stress Concern Present (11/03/2022)   [employer] of Occupational Health - Occupational Stress Questionnaire    Feeling of Stress : Rather much  Social Connections: Moderately Isolated (11/03/2022)   Social Connection and Isolation Panel    Frequency of Communication with Friends and Family: Twice a week    Frequency of Social Gatherings with Friends and Family: Three times a week    Attends Religious Services: 1 to 4 times per year    Active Member of Clubs or Organizations: No    Attends Banker Meetings: Never    Marital Status: Separated  Intimate Partner Violence: Not At Risk (11/03/2022)   Humiliation, Afraid, Rape, and Kick questionnaire    Fear of Current or Ex-Partner: No    Emotionally Abused: No    Physically Abused: No    Sexually Abused: No    FAMILY HISTORY: Family History  Problem Relation Age of Onset   Cancer Sister    Diabetes Mother    Cancer Maternal Grandmother    Cancer Paternal Grandmother     ALLERGIES:  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  MEDICATIONS:  Current Outpatient Medications  Medication Sig Dispense Refill   calcium -vitamin D  (OSCAL WITH D) 500-5 MG-MCG tablet Take 2 tablets by mouth daily. 60 tablet 3   capecitabine  (XELODA ) 150 MG tablet TAKE 1 TABLET BY MOUTH 2 TIMES  A DAY AFTER A MEAL FOR 14 DAYS ON, THEN 7 DAYS OFF, REPEAT EVERY 21 DAYS. (TOTAL DOSE OF 1,650 MG 2 TIMES A DAY) 28 tablet 1   capecitabine  (XELODA ) 500 MG tablet TAKE 3 TABLETS BY MOUTH 2 TIMES A DAY AFTER A MEAL FOR 14 DAYS ON, THEN 7 DAYS OFF, REPEAT EVERY 21 DAYS. (TOTAL DOSE OF 1,650 MG 2 TIMES A DAY) 84 tablet 1   Cholecalciferol  (VITAMIN D ) 50 MCG (2000 UT) CAPS Take 1 capsule (2,000  Units total) by mouth daily.     citalopram  (CELEXA ) 10 MG tablet Take 1 tablet (10 mg total) by mouth daily. 30 tablet 3   clindamycin  (CLINDAGEL ) 1 % gel Apply topically 2 (two) times daily. 30 g 4   lidocaine -prilocaine  (EMLA ) cream Apply 1 Application topically as needed. Apply small amount to port and cover with saran wrap 1-2 hours prior to port access 30 g 2   loperamide  (IMODIUM ) 2 MG capsule Take 1 capsule (2 mg total) by mouth See admin instructions. 90 capsule 2   losartan  (COZAAR ) 25 MG tablet Take 1 tablet (25 mg total) by mouth daily. 90 tablet 1   magnesium  chloride (SLOW-MAG) 64 MG TBEC SR tablet Take 1 tablet (64 mg total) by mouth daily. 90 tablet 1   ondansetron  (ZOFRAN ) 8 MG tablet Take 1 tablet (8 mg total) by mouth every 8 (eight) hours as needed for nausea or vomiting. 90 tablet 1   potassium chloride  SA (KLOR-CON  M) 20 MEQ tablet Take 2 tablets (40 mEq total) by mouth in the morning and take 1 tablet ( ) in the evening. 60 tablet 2   prochlorperazine  (COMPAZINE ) 10 MG tablet Take 1 tablet (10 mg total) by mouth every 6 (six) hours as needed for nausea or vomiting. 90 tablet 1   rivaroxaban  (XARELTO ) 10 MG TABS tablet Take 1 tablet (10 mg total) by mouth daily. 30 tablet 5   senna (SENOKOT) 8.6 MG TABS tablet Take 2 tablets (17.2 mg total) by mouth daily. 100 tablet 0   traZODone  (DESYREL ) 50 MG tablet Take 1 tablet (50 mg total) by mouth at bedtime as needed for sleep. 30 tablet 3   No current facility-administered medications for this visit.   Facility-Administered Medications  Ordered in Other Visits  Medication Dose Route Frequency Provider Last Rate Last Admin   0.9 %  sodium chloride  infusion   Intravenous Continuous Babara Call, MD 10 mL/hr at 03/24/24 1021 New Bag at 03/24/24 1021   heparin  lock flush 100 unit/mL  500 Units Intravenous Once Babara Call, MD       heparin  lock flush 100 unit/mL  500 Units Intracatheter Once PRN Babara Call, MD       panitumumab  (VECTIBIX ) 700 mg in sodium chloride  0.9 % 100 mL chemo infusion  9 mg/kg (Order-Specific) Intravenous Once Babara Call, MD 270 mL/hr at 03/24/24 1012 700 mg at 03/24/24 1012     PHYSICAL EXAMINATION: ECOG PERFORMANCE STATUS: 1 - Symptomatic but completely ambulatory Vitals:   03/24/24 0841  BP: (!) 125/98  Pulse: 89  Resp: 20  Temp: (!) 97.5 F (36.4 C)  SpO2: 100%     Filed Weights   03/24/24 0841  Weight: 182 lb 4.8 oz (82.7 kg)       Physical Exam HENT:     Head: Normocephalic and atraumatic.  Eyes:     General: No scleral icterus. Cardiovascular:     Rate and Rhythm: Normal rate and regular rhythm.     Heart sounds: Normal heart sounds.  Pulmonary:     Effort: Pulmonary effort is normal. No respiratory distress.     Breath sounds: No wheezing.     Comments: Decreased breath sound bilaterally.  Abdominal:     General: Bowel sounds are normal. There is no distension.     Palpations: Abdomen is soft.     Comments: Colostomy bag  Genitourinary:    Comments: History of APR, Musculoskeletal:        General: No deformity.  Normal range of motion.     Cervical back: Normal range of motion and neck supple.  Skin:    General: Skin is warm and dry.     Findings: No erythema.  Neurological:     Mental Status: He is alert and oriented to person, place, and time. Mental status is at baseline.     Cranial Nerves: No cranial nerve deficit.  Psychiatric:        Mood and Affect: Mood normal.     LABORATORY DATA:  I have reviewed the data as listed    Latest Ref Rng & Units 03/24/2024     8:13 AM 03/03/2024    8:03 AM 02/11/2024    8:35 AM  CBC  WBC 4.0 - 10.5 K/uL 4.2  5.0  5.6   Hemoglobin 13.0 - 17.0 g/dL 84.2  84.1  85.3   Hematocrit 39.0 - 52.0 % 46.3  45.0  41.4   Platelets 150 - 400 K/uL 204  205  204       Latest Ref Rng & Units 03/24/2024    8:13 AM 03/03/2024    8:03 AM 02/11/2024    8:35 AM  CMP  Glucose 70 - 99 mg/dL 876  863  858   BUN 6 - 20 mg/dL 8  9  9    Creatinine 0.61 - 1.24 mg/dL 9.20  9.00  9.25   Sodium 135 - 145 mmol/L 138  140  138   Potassium 3.5 - 5.1 mmol/L 3.6  3.5  3.4   Chloride 98 - 111 mmol/L 106  105  107   CO2 22 - 32 mmol/L 25  25  24    Calcium  8.9 - 10.3 mg/dL 8.6  9.2  8.1   Total Protein 6.5 - 8.1 g/dL 7.2  7.3  6.8   Total Bilirubin 0.0 - 1.2 mg/dL 0.8  0.8  0.6   Alkaline Phos 38 - 126 U/L 86  99  93   AST 15 - 41 U/L 22  20  18    ALT 0 - 44 U/L 19  18  17       RADIOGRAPHIC STUDIES: I have personally reviewed the radiological images as listed and agreed with the findings in the report. No results found.

## 2024-03-24 NOTE — Patient Instructions (Signed)
 [facility]  Discharge Instructions: Thank you for choosing [facility] to provide your oncology and hematology care.  If you have a lab appointment with the Cancer Center, please go directly to the Cancer Center and check in at the registration area.  Wear comfortable clothing and clothing appropriate for easy access to any Portacath or PICC line.   We strive to give you quality time with your provider. You may need to reschedule your appointment if you arrive late (15 or more minutes).  Arriving late affects you and other patients whose appointments are after yours.  Also, if you miss three or more appointments without notifying the office, you may be dismissed from the clinic at the provider's discretion.      For prescription refill requests, have your pharmacy contact our office and allow 72 hours for refills to be completed.    Today you received the following chemotherapy and/or immunotherapy agents Vectibix.      To help prevent nausea and vomiting after your treatment, we encourage you to take your nausea medication as directed.  BELOW ARE SYMPTOMS THAT SHOULD BE REPORTED IMMEDIATELY: *FEVER GREATER THAN 100.4 F (38 C) OR HIGHER *CHILLS OR SWEATING *NAUSEA AND VOMITING THAT IS NOT CONTROLLED WITH YOUR NAUSEA MEDICATION *UNUSUAL SHORTNESS OF BREATH *UNUSUAL BRUISING OR BLEEDING *URINARY PROBLEMS (pain or burning when urinating, or frequent urination) *BOWEL PROBLEMS (unusual diarrhea, constipation, pain near the anus) TENDERNESS IN MOUTH AND THROAT WITH OR WITHOUT PRESENCE OF ULCERS (sore throat, sores in mouth, or a toothache) UNUSUAL RASH, SWELLING OR PAIN  UNUSUAL VAGINAL DISCHARGE OR ITCHING   Items with * indicate a potential emergency and should be followed up as soon as possible or go to the Emergency Department if any problems should occur.  Please show the CHEMOTHERAPY ALERT CARD or IMMUNOTHERAPY  ALERT CARD at check-in to the Emergency Department and triage nurse.  Should you have questions after your visit or need to cancel or reschedule your appointment, please contact [facility] - A DEPT OF [facility]  [phone] and follow the prompts.  Office hours are 8:00 a.m. to 4:30 p.m. Monday - Friday. Please note that voicemails left after 4:00 p.m. may not be returned until the following business day.  We are closed weekends and major holidays. You have access to a nurse at all times for urgent questions. Please call the main number to the clinic [phone] and follow the prompts.  For any non-urgent questions, you may also contact your provider using MyChart. We now offer e-Visits for anyone 49 and older to request care online for non-urgent symptoms. For details visit [email or web address].   Also download the MyChart app! Go to the app store, search "MyChart", open the app, select [place], and log in with your MyChart username and password.

## 2024-03-24 NOTE — Assessment & Plan Note (Signed)
 locally advanced rectal cancer-Stage IIIB, s/p TNT neoadjuvant protocol, concurrent Xeloda  and radiation-Finished 08/27/2021, s/p  APR,rectal adenocarcinoma, [id number]. Positive radial margin due to perforation. Biopsy proven recurrent rectal cancer, locally advanced disease, possible colovesical fistula; no distant metastatic disease on PET- per [name] Surgeon Dr. Florie, recurrence is not resectable. --> June 2024 S/p concurrent Xeloda  825 mg/m2 twice daily  with radiation --> July 2024 MRI pelvis w wo contrast showed persistent disease. Disease is not resectable per colorectal surgeon --> FOLFIRI Panitumumab  --> Dec 2025 partial response. --> per pt's preference switched to Xeloda  + Panitumumab . [Pt declined 5-FU pump] Labs are reviewed and discussed with patient.  Proceed with cycle 6 Xeloda  850 mg/m2 twice daily on days 1 to 14 of a 21-day cycle plus Panitumumab  Q3 weeks. [Panel study regimen].  He tolerates regimen well.  Repeat CT in July

## 2024-03-24 NOTE — Assessment & Plan Note (Signed)
 December 2024 right lower lobe segmental and lobar pulmonary embolism On  Xarelto  to 10mg  daily. - he reports that he can not afford copay.  Obtain CT chest angiogram - hemoptysis.

## 2024-03-24 NOTE — Assessment & Plan Note (Signed)
 Continue  losartan  25mg  daily. Recommend patient to monitor BP at home.  Follow up with PCP

## 2024-03-24 NOTE — Assessment & Plan Note (Signed)
 continue slow Mag 1 tab daily.

## 2024-03-24 NOTE — Assessment & Plan Note (Signed)
Continue calcium 1000mg  daily

## 2024-03-24 NOTE — Assessment & Plan Note (Signed)
 Chemotherapy plan as listed

## 2024-03-25 LAB — CEA: CEA: 2.1 ng/mL (ref 0.0–4.7)

## 2024-03-29 ENCOUNTER — Ambulatory Visit

## 2024-04-05 ENCOUNTER — Ambulatory Visit
Admission: RE | Admit: 2024-04-05 | Discharge: 2024-04-05 | Disposition: A | Discharge status: Home / Self Care | Source: Ambulatory Visit | Attending: Oncology | Admitting: Oncology

## 2024-04-05 DIAGNOSIS — C20 Malignant neoplasm of rectum: Secondary | ICD-10-CM

## 2024-04-05 DIAGNOSIS — R042 Hemoptysis: Secondary | ICD-10-CM | POA: Diagnosis present

## 2024-04-05 MED ORDER — IOHEXOL 350 MG/ML SOLN
100.0000 mL | Freq: Once | INTRAVENOUS | Status: AC | PRN
  Administered 2024-04-05: 100 mL via INTRAVENOUS

## 2024-04-05 MED ORDER — HEPARIN SOD (PORK) LOCK FLUSH 100 UNIT/ML IV SOLN
INTRAVENOUS | Status: AC
  Filled 2024-04-05: qty 5

## 2024-04-05 MED ORDER — HEPARIN SOD (PORK) LOCK FLUSH 100 UNIT/ML IV SOLN
500.0000 [IU] | Freq: Once | INTRAVENOUS | Status: AC
  Administered 2024-04-05: 500 [IU] via INTRAVENOUS
  Filled 2024-04-05: qty 5

## 2024-04-08 ENCOUNTER — Other Ambulatory Visit

## 2024-04-08 ENCOUNTER — Ambulatory Visit

## 2024-04-08 ENCOUNTER — Ambulatory Visit: Admitting: Oncology

## 2024-04-13 ENCOUNTER — Other Ambulatory Visit: Payer: Self-pay | Admitting: Oncology

## 2024-04-13 ENCOUNTER — Telehealth: Payer: Self-pay | Admitting: Oncology

## 2024-04-13 NOTE — Telephone Encounter (Signed)
 Pt had a death in the family, wants to r.s appt from 04/14/2024 to 04/22/2024. Appt date and time confirmed with pt.  Dr. Babara, can you update the dates on the IS order to be 04/22/2024. Thank you

## 2024-04-14 ENCOUNTER — Inpatient Hospital Stay: Admitting: Oncology

## 2024-04-14 ENCOUNTER — Inpatient Hospital Stay

## 2024-04-14 ENCOUNTER — Inpatient Hospital Stay: Attending: Oncology

## 2024-04-22 ENCOUNTER — Other Ambulatory Visit: Payer: Self-pay

## 2024-04-22 ENCOUNTER — Inpatient Hospital Stay

## 2024-04-22 ENCOUNTER — Inpatient Hospital Stay (HOSPITAL_BASED_OUTPATIENT_CLINIC_OR_DEPARTMENT_OTHER): Admitting: Oncology

## 2024-04-22 ENCOUNTER — Inpatient Hospital Stay: Attending: Oncology

## 2024-04-22 ENCOUNTER — Encounter: Payer: Self-pay | Admitting: Oncology

## 2024-04-22 VITALS — BP 144/103 | HR 88 | Temp 96.2°F | Resp 18 | Wt 186.6 lb

## 2024-04-22 DIAGNOSIS — Z5112 Encounter for antineoplastic immunotherapy: Secondary | ICD-10-CM | POA: Insufficient documentation

## 2024-04-22 DIAGNOSIS — C20 Malignant neoplasm of rectum: Secondary | ICD-10-CM

## 2024-04-22 DIAGNOSIS — Z79899 Other long term (current) drug therapy: Secondary | ICD-10-CM | POA: Insufficient documentation

## 2024-04-22 DIAGNOSIS — I10 Essential (primary) hypertension: Secondary | ICD-10-CM | POA: Diagnosis not present

## 2024-04-22 DIAGNOSIS — Z9221 Personal history of antineoplastic chemotherapy: Secondary | ICD-10-CM | POA: Insufficient documentation

## 2024-04-22 DIAGNOSIS — Z86711 Personal history of pulmonary embolism: Secondary | ICD-10-CM | POA: Insufficient documentation

## 2024-04-22 DIAGNOSIS — Z7901 Long term (current) use of anticoagulants: Secondary | ICD-10-CM | POA: Insufficient documentation

## 2024-04-22 DIAGNOSIS — E559 Vitamin D deficiency, unspecified: Secondary | ICD-10-CM

## 2024-04-22 DIAGNOSIS — E876 Hypokalemia: Secondary | ICD-10-CM | POA: Insufficient documentation

## 2024-04-22 DIAGNOSIS — Z5111 Encounter for antineoplastic chemotherapy: Secondary | ICD-10-CM

## 2024-04-22 LAB — CBC WITH DIFFERENTIAL (CANCER CENTER ONLY)
Abs Immature Granulocytes: 0.02 K/uL (ref 0.00–0.07)
Basophils Absolute: 0 K/uL (ref 0.0–0.1)
Basophils Relative: 1 %
Eosinophils Absolute: 0.2 K/uL (ref 0.0–0.5)
Eosinophils Relative: 4 %
HCT: 43.7 % (ref 39.0–52.0)
Hemoglobin: 15.4 g/dL (ref 13.0–17.0)
Immature Granulocytes: 1 %
Lymphocytes Relative: 31 %
Lymphs Abs: 1.3 K/uL (ref 0.7–4.0)
MCH: 32.5 pg (ref 26.0–34.0)
MCHC: 35.2 g/dL (ref 30.0–36.0)
MCV: 92.2 fL (ref 80.0–100.0)
Monocytes Absolute: 0.6 K/uL (ref 0.1–1.0)
Monocytes Relative: 14 %
Neutro Abs: 2.1 K/uL (ref 1.7–7.7)
Neutrophils Relative %: 49 %
Platelet Count: 183 K/uL (ref 150–400)
RBC: 4.74 MIL/uL (ref 4.22–5.81)
RDW: 13.6 % (ref 11.5–15.5)
WBC Count: 4.2 K/uL (ref 4.0–10.5)
nRBC: 0 % (ref 0.0–0.2)

## 2024-04-22 LAB — CMP (CANCER CENTER ONLY)
ALT: 18 U/L (ref 0–44)
AST: 19 U/L (ref 15–41)
Albumin: 3.5 g/dL (ref 3.5–5.0)
Alkaline Phosphatase: 89 U/L (ref 38–126)
Anion gap: 8 (ref 5–15)
BUN: 7 mg/dL (ref 6–20)
CO2: 23 mmol/L (ref 22–32)
Calcium: 8.5 mg/dL — ABNORMAL LOW (ref 8.9–10.3)
Chloride: 107 mmol/L (ref 98–111)
Creatinine: 0.72 mg/dL (ref 0.61–1.24)
GFR, Estimated: >60 mL/min (ref >60)
Glucose, Bld: 124 mg/dL — ABNORMAL HIGH (ref 70–99)
Potassium: 3.3 mmol/L — ABNORMAL LOW (ref 3.5–5.1)
Sodium: 138 mmol/L (ref 135–145)
Total Bilirubin: 0.6 mg/dL (ref 0.0–1.2)
Total Protein: 6.7 g/dL (ref 6.5–8.1)

## 2024-04-22 LAB — MAGNESIUM: Magnesium: 1.5 mg/dL — ABNORMAL LOW (ref 1.7–2.4)

## 2024-04-22 MED ORDER — SODIUM CHLORIDE 0.9 % IV SOLN
INTRAVENOUS | Status: DC
  Filled 2024-04-22: qty 250

## 2024-04-22 MED ORDER — MAGNESIUM SULFATE 2 GM/50ML IV SOLN
2.0000 g | Freq: Once | INTRAVENOUS | Status: AC
  Administered 2024-04-22: 2 g via INTRAVENOUS
  Filled 2024-04-22: qty 50

## 2024-04-22 MED ORDER — SODIUM CHLORIDE 0.9 % IV SOLN
9.0000 mg/kg | Freq: Once | INTRAVENOUS | Status: AC
  Administered 2024-04-22: 700 mg via INTRAVENOUS
  Filled 2024-04-22: qty 15

## 2024-04-22 MED ORDER — POTASSIUM CHLORIDE CRYS ER 20 MEQ PO TBCR
20.0000 meq | EXTENDED_RELEASE_TABLET | ORAL | 2 refills | Status: AC
Start: 2024-04-22 — End: ?
  Filled 2024-04-22: qty 60, 20d supply, fill #0

## 2024-04-22 NOTE — Assessment & Plan Note (Addendum)
 continue slow Mag 1 tab daily.  IV Mag 2g x 1

## 2024-04-22 NOTE — Progress Notes (Signed)
 Hematology/Oncology Progress note [phone] [phone]) [phone]      CHIEF COMPLAINTS/REASON FOR VISIT:  Follow up for recurrent rectal cancer treatment  ASSESSMENT & PLAN:   Cancer Staging  Rectal cancer [facility]) Staging form: Colon and Rectum, AJCC 8th Edition - Clinical stage from 02/08/2021: Stage IIIB (cT4a, cN1, cM0) - Signed by Babara Call, MD on 03/06/2021   Rectal cancer [facility]) locally advanced rectal cancer-Stage IIIB, s/p TNT neoadjuvant protocol, concurrent Xeloda  and radiation-Finished 08/27/2021, s/p  APR,rectal adenocarcinoma, ypT3N0. Positive radial margin due to perforation. Biopsy proven recurrent rectal cancer, locally advanced disease, possible colovesical fistula; no distant metastatic disease on PET- per [name] Surgeon Dr. Florie, recurrence is not resectable. --> June 2024 S/p concurrent Xeloda  825 mg/m2 twice daily  with radiation --> July 2024 MRI pelvis w wo contrast showed persistent disease. Disease is not resectable per colorectal surgeon --> FOLFIRI Panitumumab  --> Dec 2025 partial response. --> per pt's preference switched to Xeloda  + Panitumumab . [Pt declined 5-FU pump] Labs are reviewed and discussed with patient.  Proceed with next cycle Xeloda  850 mg/m2 twice daily on days 1 to 14 of a 21-day cycle plus Panitumumab  Q3 weeks. [Panel study regimen].  He tolerates regimen well.  Repeat CT in July  Encounter for antineoplastic chemotherapy Chemotherapy plan as listed  History of pulmonary embolism December 2024 right lower lobe segmental and lobar pulmonary embolism On  Xarelto  to 10mg  daily.   Hypertension Continue  losartan  25mg  daily. Recommend patient to monitor BP at home.  Follow up with PCP  Hypocalcemia Continue calcium  1000mg  daily.    Hypokalemia Continue  potassium to 40meq in AM and 20meq in PM.    Hypomagnesemia continue slow Mag 1 tab daily.  IV Mag 2g x 1   Vitamin D  deficiency Recommend patient to take vitamin D   2000 units daily        Orders Placed This Encounter  Procedures   Magnesium     Standing Status:   Future    Expected Date:   05/13/2024    Expiration Date:   05/13/2025   CEA    Standing Status:   Future    Expected Date:   05/13/2024    Expiration Date:   05/13/2025   CBC with Differential (Cancer Center Only)    Standing Status:   Future    Expected Date:   05/13/2024    Expiration Date:   05/13/2025   CMP (Cancer Center only)    Standing Status:   Future    Expected Date:   05/13/2024    Expiration Date:   05/13/2025   Magnesium     Standing Status:   Future    Expected Date:   06/03/2024    Expiration Date:   06/03/2025   CEA    Standing Status:   Future    Expected Date:   06/03/2024    Expiration Date:   06/03/2025   CBC with Differential (Cancer Center Only)    Standing Status:   Future    Expected Date:   06/03/2024    Expiration Date:   06/03/2025   CMP (Cancer Center only)    Standing Status:   Future    Expected Date:   06/03/2024    Expiration Date:   06/03/2025    Follow-up  3 weeks All questions were answered. The patient knows to call the clinic with any problems, questions or concerns.  Call Babara, MD, PhD [facility] Hematology Oncology 04/22/2024     HISTORY OF  PRESENTING ILLNESS:   Barry [name]. is a  58 y.o.  male presents for rectal cancer Oncology History  Rectal cancer (HCC)  02/08/2021 Initial Diagnosis   Rectal cancer   02/05/2021-02/06/2021 patient was hospitalized due to generalized weakness, intermittent lightheadedness, weight loss and worsening constipation.  02/05/2021 CT abdomen showed concerning of severe rectal wall thickening and right internal iliac lymph node concerning for metastatic disease.  Patient was seen by gastroenterology and had colonoscopy which showed a circumferential fungating mass in the rectum.  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   02/14/2021-02/16/2021 hospitalized due to rectal bleeding and pain.    02/14/2021 CT showed perirectal fluid collection/gas concerning for infection with significant leukocytosis, anemia with hemoglobin of 6.8.  Patient received PRBC transfusion, IV antibiotics.  He underwent an IR guided placement of JP drain into the rectal abscess.  Discharged home with oral Augmentin . 02/20/2021 presented to ER with new skin opening and draining from left gluteus.  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  JP drain has been removed.  Patient was continued on Augmentin .   02/13/2021, PET scan showed locally advanced rectal cancer with billowing of the mesorectum now with low attenuation material that was not present on previous examination with extensive stranding and inflammation.   Bulky RIGHT pelvic sidewall/hypogastric lymph node outside of the mesorectum with stippled calcification measuring 19 mm-no increased metabolic activity. Small LEFT hypogastric lymph node just peripheral to the internal external bifurcation-SUV 3.2 High RIGHT internal just below or at the internal/common iliac transition, lymph node -SUV 4.8 LEFT high hypogastric lymph node  8 mm-SUV 3. Scattered lymph nodes throughout the retroperitoneum with low FDG uptake Bilateral inguinal lymph nodes largest on the RIGHT (image 248/3) 11 mm with a maximum SUV of 2.8 spiculated nodule in the LEFT upper lobe- 9 x 8 mm       02/14/2021, CT abdomen pelvis without contrast Showed perforated rectal mass with contained perforation with fluid and gas extending above and below the pelvic floor,potentially involving the sphincter complex and extending into LEFT ischial rectal fossa   02/20/2021, CT pelvis with contrast showed Large perirectal/perianal abscess has markedly decreased in size since placement of the percutaneous drain. There are residual gas-filled collections in the soft tissues and suspect there is a fistula or sinus tract between the rectal mass and the subcutaneous tissues. Soft tissue gas along the medial left  buttock and concern for a cutaneous ulceration in this area. Large rectal mass with evidence for a large necrotic right pelvic lymph node.   His case is complicated with a perirectal abscess. Prior to onset of perirectal abscess, on his 02/05/2021 scan, he was noted to have right internal iliac lymph node 3 x 1 x 2.3 cm which is concerning for nodal disease.On Subsequent images it was difficult to distinguish whether lymphadenopathy was due to nodal disease versus acute inflammation. 03/07/2021 MRI pelvis cT3 N2. Due to the possible contained perforation on previous CT, possible cT4 disease.    02/27/2021 medi port placed by Dr.Dew 03/13/2021 reports right butt cheek and also left perianal area fullness.he was seen by Dr.Pabon urgently  and had right buttock abscess drained. Left perianal fullness was felt to be due to cancer.    02/08/2021 Cancer Staging   Staging form: Colon and Rectum, AJCC 8th Edition - Clinical stage from 02/08/2021: Stage IIIB (cT4a, cN1, cM0) - Signed by Babara Call, MD on 03/06/2021 Stage prefix: Initial diagnosis   02/27/2021 Procedure   medi port  placed by Dr.Dew   03/18/2021 - 07/05/2021 Chemotherapy    FOLFOX q14d x 4 months      07/17/2021 - 08/27/2021 Chemotherapy   Xeloda  concurrent with Radiation   11/18/2021 Surgery   patient is status post open APR with flap for rectal cancer. Pathology rectal adenomacarcinoma ypT3N0.  Positive margin: Radial (circumferential) or mesenteric: adjacent to perforation      01/27/2022 - 02/02/2022 Hospital Admission   Hospitalized at [facility] due to recurrent abscess.  US  guided drain into the pelvic abscess with return of 60 ml of purulent fluid. Cultures positive for staph aureus and strep agalactiae group b, fungal cultures negative.    02/25/2022 Imaging   CT chest angiogram with and without contrast, CT abdomen pelvis with and without contrast 1. Negative for acute pulmonary embolus.2. Emphysema. Further decrease in size of the  previously noted irregular nodule in the left upper lobe which is now barely measurable today. No new suspicious lung nodules 3. Status post left lower quadrant colostomy. Further decrease in size since comparison exam from May of the rim enhancing gas and fluid collection at the pelvic surgical bed extending from the perineum superiorly into the pelvis 4. Slightly thickened appearance of terminal ileal small bowel loops in the pelvis with mild stranding suggesting small bowel inflammatory process. 5. Stable enlarged right pelvic sidewall lymph node   06/02/2022 Imaging   CT chest abdomen pelvis w contrast 1. Status post abdominal perineal resection with descending colostomy. 2. At the site of pelvic fluid and gas collection on 02/25/2022, there is residual, decreased presacral soft tissue fullness, without drainable collection. 3. Similar right obturator nodal metastasis.4.  No acute process or evidence of metastatic disease in the chest. 5. Age advanced coronary artery atherosclerosis. Recommend assessment of coronary risk factors. 6. Aortic atherosclerosis and emphysema     06/26/2022 Imaging   CT abdomen pelvis with contrast showed 1. Interval increase in size of a lobulated partially imaged at least 7.8 x 3.8 x 12 cm abscess in the perianal region in a patient status post abdominal perineal resection and left lower lobe end colostomy formation. Finding extends to involve the pre sacral region up to the urinary dome level. Associated posterior urinary bladder wall thickening with lack of intraperitoneal fat plane between the presacral soft tissue thickening/abscess formation suggestive of possible fistulization and invasion of the posterior bladder wall. Underlying recurrent malignancy is not excluded. 2. Stable 2.7 cm right pelvic sidewall lymph node. 3.  Aortic Atherosclerosis   06/26/2022 - 06/27/2022 Hospital Admission   He went to [facility] ER and CT showed recurrent abscess. He was  transferred to [facility] due to recurrent abscess, treated with IV vanc/zosyn , IR was consulted and drainage tube was placed. Wound grew up Strep viridans. Patient was seen by ID at [facility] discharged with Augmentin  for 2 weeks.   He was seen by wound care Dr> Georgie on 07/09/2022, JP drain was removed.    09/29/2022 Imaging   MRI pelvis without contrast showed  Interval abdominoperitoneal resection since prior MRI. Decreased small fluid collection in the surgical bed compared with more recent CT, consistent with resolving postoperative fluid collection or abscess.   Bulky rounded presacral mass, which shows diffuse restricted diffusion, without significant change in size since most recent CT of 06/26/2022. This raises suspicion for recurrent carcinoma over post treatment changes. Recommend correlation with CEA level, and consider tissue sampling or PET-CT.   Stable enlarged right pelvic sidewall lymph node. No new or increased adenopathy identified.  10/09/2022 Imaging   CT chest with contrast showed 1. Unchanged 0.4 cm fissural nodule of the anterior left lower lobe. This is almost certainly a benign fissural lymph node. No new or suspicious pulmonary nodules. 2. Moderate emphysema and diffuse bilateral bronchial wall thickening. 3. Coronary artery disease.   10/13/2022 Imaging   CT abdomen pelvis with contrast showed 1. Complex collection persists in the lower pelvis, extending from the anal verge upwards into the presacral space and anteriorly from the presacral space to the bladder dome, not significantly changed in size or extent compared to the earlier CT of 06/26/2022. This is most likely a combination of a postoperative seroma and/or chronic phlegmon/abscess versus recurrent abscess. 2. Bladder walls are thick walled/edematous. Given the contiguity of the presacral collection and the bladder dome, this is highly suspicious for a related bladder wall infection and possibly secondary to colovesical  fistula. Recommend correlation with urinalysis. 3. No evidence of bowel obstruction. LEFT lower abdominal wall colostomy, without obstruction or inflammatory change. 4. No free intraperitoneal air   10/13/2022 [facility] Admission   Hospitalized due to fever and rectal pain. Urine culture showed mixed urogenital flora. IR CT guided Aspiration of abscess showed Streptococcal Viridans. He was placed back on Augmentin   10/14/2023 CT read by [facility] radiology Postsurgical changes of abdominoperineal resection. Interval enlargement of  fluid collection in the surgical bed. Multiple foci of enhancing tissue at  the margins of this fluid collection, increased from prior study, suspicious for local recurrence.   Prominent retroperitoneal lymph nodes, some which are increased in size from prior study. These are indeterminate and may be either reactive or represent metastatic disease. Recommend continued attention on follow-up.   Centrally hypoattenuating right obturator lymph node, unchanged in size, consistent with treated disease.   Circumferential bladder wall thickening and irregularity, most pronounced on the left side. This may be reactive in the setting of adjacent pelvic inflammatory changes. Correlate with urinalysis if there is clinical  concern for urinary tract infection.    11/14/2022 Imaging   MRI pelvis w wo contrast  1. Status post abdominoperineal resection with left lower quadrant end colostomy. 2. Compared to prior CT and MR, no significant change in appearance of heterogeneous, rim enhancing presacral and low pelvic soft tissue and fluid. Largest heterogeneously enhancing conglomerate of fluid and soft tissue appears to closely involve the anteriorly abutting the seminal vesicles and measures 3.0 x 2.9 cm in largest axial dimension. This extends to superiorly contact the posterior bladder dome and inferiorly towards the gluteal cleft. 3. Discrete fluid component at the most inferior extent  measuring 2.4 x 1.3 cm. This is markedly diminished in volume compared to more remote previous examinations, for example 06/26/2022. 4. Constellation of findings is highly concerning for locally recurrent rectal malignancy with or without superimposed infection. Presence or absence of infection at this time is not established by MR. 5. Additional unchanged hemorrhagic or proteinaceous fluid collection in the right hemipelvis measuring 3.2 x 2.6 cm most consistent with postoperative hematoma or seroma. 6. Unchanged thickening of the urinary bladder wall. As previously reported, fistula or involvement of bladder wall by malignancy not excluded.   12/09/2022 Procedure   CT guided biopsy of the pelvic mass showed Moderately differentiated adenocarcinoma with dirty necrosis, morphologically identical to patient's prior rectal adenocarcinoma.   [facility] NGS xT 648 panel showed MLH3 start loss,  BCORL1 frame shift, TP53 splice region varian, APC stop gain,  TMB 7.9, MS stable, KRAS/BRAF/NRAS negative.   [facility]  NGS xR showed no gene arrangement or reportable altered splicing events in RNA sequencing      01/07/2023 -  Chemotherapy   Xeloda  825 mg/m2 twice daily  with radiation   02/11/2023 Imaging   CT abdomen pelvis w contrast  1. Examination is positive for small bowel obstruction. Transition point is identified within the left iliac fossa. Distal to the transition point the small bowel loops exhibit mucosal enhancement and wall thickening concerning for enteritis. 2. There is new right-sided hydronephrosis and hydroureter up to the level of the bifurcation of right common iliac artery. The distal right ureter appears closely associated with the previously characterized tracer avid presacral soft tissue mass. Cannot exclude obstructive uropathy secondary to tumor involvement. 3. Similar appearance of presacral soft tissue mass. This was tracer avid on the recent PET-CT from 12/17/2022, and concerning for  locally recurrent tumor. 4. Unchanged diffuse circumferential wall thickening involving the urinary bladder. 5. Aortic Atherosclerosis    04/17/2023 - 07/29/2023 Chemotherapy   Patient is on Treatment Plan : COLORECTAL FOLFIRI + Panitumumab  q14d     08/14/2023 Imaging   CT chest abdomen pelvis w contrast showed  Heterogeneous central pelvic mass in the area of the rectum overall similar configuration to the previous CT scan. Again the lesion abuts the bladder in the area of the seminal vesicles. Extension up along the presacral space. Surgical changes from diverting colostomy.   Essentially stable right pelvic sidewall soft tissue mass or nodal enlargement. No new nodal enlargement elsewhere in the chest, abdomen or pelvis.   Fatty liver infiltration.   Subtle nodular opacity along the right lung base. Infiltrates possible. Please correlate with symptoms and recommend follow-up.   Incidental right lower lobe segmental and lobar pulmonary embolism.     09/23/2023 -  Chemotherapy   Patient is on Treatment Plan : COLORECTAL Xeloda  + Panitumumab  q21d     12/10/2023 Imaging   CT chest abdomen pelvis w contrast showed 1. Similar appearance of soft tissue mass within the posterior pelvis in the region of the rectum. This is stable to mildly decreased in the interval. As noted previously tumor extends along the presacral soft tissue space and abuts the posterior dome of bladder and posterior bladder base. 2. Stable appearance of soft tissue mass/enlarged lymph node within the right posterior pelvic sidewall. 3. No new or progressive disease identified. 4.  Aortic Atherosclerosis (ICD10-I70.0).       INTERVAL HISTORY Barry [name]. is a 58 y.o. male who has above history reviewed by me today presents for follow up visit for management of recurrent locally advanced rectal cancer. Denies fever chills, rectal pain. Denies rectal discharge.   No diarrhea after each treatments.  He  has loose BM before treatments, this is a chronic issue for him.  Denies nausea vomiting or abdominal pain.  No leg swelling.  No additional hemoptysis Denies cough or SOB      Review of Systems  Constitutional:  Positive for fatigue. Negative for appetite change, chills, fever and unexpected weight change.  HENT:   Negative for hearing loss and voice change.   Eyes:  Negative for eye problems and icterus.  Respiratory:  Negative for chest tightness, cough, hemoptysis and shortness of breath.   Cardiovascular:  Negative for chest pain and leg swelling.  Gastrointestinal:  Negative for abdominal distention, abdominal pain, blood in stool, constipation, diarrhea and rectal pain.  Endocrine: Negative for hot flashes.  Genitourinary:  Negative for difficulty urinating, dysuria and frequency.  Musculoskeletal:  Negative for arthralgias.  Skin:  Negative for itching and rash.  Neurological:  Negative for light-headedness and numbness.  Hematological:  Negative for adenopathy. Does not bruise/bleed easily.  Psychiatric/Behavioral:  Negative for confusion.     MEDICAL HISTORY:  Past Medical History:  Diagnosis Date   Headache    Left shoulder pain    Rectal cancer (HCC)     SURGICAL HISTORY: Past Surgical History:  Procedure Laterality Date   COLON SURGERY     COLONOSCOPY WITH PROPOFOL  N/A 02/06/2021   Procedure: COLONOSCOPY WITH PROPOFOL ;  Surgeon: Unk Corinn Skiff, MD;  Location: [facility] ENDOSCOPY;  Service: Gastroenterology;  Laterality: N/A;   PORTA CATH INSERTION N/A 02/27/2021   Procedure: PORTA CATH INSERTION;  Surgeon: Marea Selinda RAMAN, MD;  Location: [facility] INVASIVE CV LAB;  Service: Cardiovascular;  Laterality: N/A;   ROTATOR CUFF REPAIR Left     SOCIAL HISTORY: Social History   Socioeconomic History   Marital status: Married    Spouse name: Not on file   Number of children: Not on file   Years of education: Not on file   Highest education level: Not on file   Occupational History   Not on file  Tobacco Use   Smoking status: Former    Current packs/day: 0.00    Average packs/day: 1 pack/day for 10.0 years (10.0 ttl pk-yrs)    Types: Cigars, Cigarettes    Start date: 02/05/2011    Quit date: 02/04/2021    Years since quitting: 3.2   Smokeless tobacco: Never  Substance and Sexual Activity   Alcohol use: Not Currently   Drug use: No   Sexual activity: Not on file  Other Topics Concern   Not on file  Social History Narrative   Not on file   Social Drivers of Health   Financial Resource Strain: High Risk (02/13/2023)   Received from [facility]   Overall Financial Resource Strain (CARDIA)    Difficulty of Paying Living Expenses: Hard  Food Insecurity: No Food Insecurity (02/13/2023)   Received from [facility]   Hunger Vital Sign    Within the past 12 months, you worried that your food would run out before you got the money to buy more.: Never true    Within the past 12 months, the food you bought just didn't last and you didn't have money to get more.: Never true  Transportation Needs: No Transportation Needs (02/13/2023)   Received from [facility] - Transportation    In the past 12 months, has lack of transportation kept you from medical appointments or from getting medications?: No    Lack of Transportation (Non-Medical): No  Physical Activity: Inactive (11/03/2022)   Exercise Vital Sign    Days of Exercise per Week: 0 days    Minutes of Exercise per Session: 0 min  Stress: Stress Concern Present (11/03/2022)   [employer] of Occupational Health - Occupational Stress Questionnaire    Feeling of Stress : Rather much  Social Connections: Moderately Isolated (11/03/2022)   Social Connection and Isolation Panel    Frequency of Communication with Friends and Family: Twice a week    Frequency of Social Gatherings with Friends and Family: Three times a week    Attends  Religious Services: 1 to 4 times per year    Active Member of Clubs or Organizations: No    Attends Banker Meetings: Never    Marital Status: Separated  Intimate Partner Violence: Not At Risk (11/03/2022)   Humiliation, Afraid, Rape, and Kick questionnaire    Fear of Current or Ex-Partner: No    Emotionally Abused: No    Physically Abused: No    Sexually Abused: No    FAMILY HISTORY: Family History  Problem Relation Age of Onset   Cancer Sister    Diabetes Mother    Cancer Maternal Grandmother    Cancer Paternal Grandmother     ALLERGIES:  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  MEDICATIONS:  Current Outpatient Medications  Medication Sig Dispense Refill   calcium -vitamin D  (OSCAL WITH D) 500-5 MG-MCG tablet Take 2 tablets by mouth daily. 60 tablet 3   capecitabine  (XELODA ) 150 MG tablet TAKE 1 TABLET BY MOUTH 2 TIMES A DAY AFTER A MEAL FOR 14 DAYS ON, THEN 7 DAYS OFF, REPEAT EVERY 21 DAYS. (TOTAL DOSE OF 1,650 MG 2 TIMES A DAY) 28 tablet 1   capecitabine  (XELODA ) 500 MG tablet TAKE 3 TABLETS BY MOUTH 2 TIMES A DAY AFTER A MEAL FOR 14 DAYS ON, THEN 7 DAYS OFF, REPEAT EVERY 21 DAYS. (TOTAL DOSE OF 1,650 MG 2 TIMES A DAY) 84 tablet 1   Cholecalciferol  (VITAMIN D ) 50 MCG (2000 UT) CAPS Take 1 capsule (2,000 Units total) by mouth daily.     citalopram  (CELEXA ) 10 MG tablet Take 1 tablet (10 mg total) by mouth daily. 30 tablet 3   clindamycin  (CLINDAGEL ) 1 % gel Apply topically 2 (two) times daily. 30 g 4   lidocaine -prilocaine  (EMLA ) cream Apply 1 Application topically as needed. Apply small amount to port and cover with saran wrap 1-2 hours prior to port access 30 g 2   loperamide  (IMODIUM ) 2 MG capsule Take 1 capsule (2 mg total) by mouth See admin instructions. 90 capsule 2   losartan  (COZAAR ) 25 MG tablet Take 1 tablet (25 mg total) by mouth daily. 90 tablet 1   magnesium  chloride (SLOW-MAG) 64 MG TBEC SR tablet Take 1 tablet (64 mg total) by mouth daily. 90 tablet 1    ondansetron  (ZOFRAN ) 8 MG tablet Take 1 tablet (8 mg total) by mouth every 8 (eight) hours as needed for nausea or vomiting. 90 tablet 1   prochlorperazine  (COMPAZINE ) 10 MG tablet Take 1 tablet (10 mg total) by mouth every 6 (six) hours as needed for nausea or vomiting. 90 tablet 1   rivaroxaban  (XARELTO ) 10 MG TABS tablet Take 1 tablet (10 mg total) by mouth daily. 30 tablet 5   senna (SENOKOT) 8.6 MG TABS tablet Take 2 tablets (17.2 mg total) by mouth daily. 100 tablet 0   traZODone  (DESYREL ) 50 MG tablet Take 1 tablet (50 mg total) by mouth at bedtime as needed for sleep. 30 tablet 3   potassium chloride  SA (KLOR-CON  M) 20 MEQ tablet Take 2 tablets (40 mEq total) by mouth in the morning and take 1 tablet ( ) in the evening. 60 tablet 2   No current facility-administered medications for this visit.   Facility-Administered Medications Ordered in Other Visits  Medication Dose Route Frequency Provider Last Rate Last Admin   heparin  lock flush 100 unit/mL  500 Units Intravenous Once Babara Call, MD         PHYSICAL EXAMINATION: ECOG PERFORMANCE STATUS: 1 - Symptomatic but completely ambulatory Vitals:   04/22/24 0830 04/22/24 0840  BP: (!) 121/103 (!) 144/103  Pulse: 88   Resp: 18   Temp: (!) 96.2 F (35.7 C)   SpO2: 100%  Filed Weights   04/22/24 0830  Weight: 186 lb 9.6 oz (84.6 kg)       Physical Exam HENT:     Head: Normocephalic and atraumatic.  Eyes:     General: No scleral icterus. Cardiovascular:     Rate and Rhythm: Normal rate and regular rhythm.     Heart sounds: Normal heart sounds.  Pulmonary:     Effort: Pulmonary effort is normal. No respiratory distress.     Breath sounds: No wheezing.     Comments: Decreased breath sound bilaterally.  Abdominal:     General: Bowel sounds are normal. There is no distension.     Palpations: Abdomen is soft.     Comments: Colostomy bag  Genitourinary:    Comments: History of APR, Musculoskeletal:         General: No deformity. Normal range of motion.     Cervical back: Normal range of motion and neck supple.  Skin:    General: Skin is warm and dry.     Findings: No erythema.  Neurological:     Mental Status: He is alert and oriented to person, place, and time. Mental status is at baseline.     Cranial Nerves: No cranial nerve deficit.  Psychiatric:        Mood and Affect: Mood normal.     LABORATORY DATA:  I have reviewed the data as listed    Latest Ref Rng & Units 04/22/2024    7:57 AM 03/24/2024    8:13 AM 03/03/2024    8:03 AM  CBC  WBC 4.0 - 10.5 K/uL 4.2  4.2  5.0   Hemoglobin 13.0 - 17.0 g/dL 84.5  84.2  84.1   Hematocrit 39.0 - 52.0 % 43.7  46.3  45.0   Platelets 150 - 400 K/uL 183  204  205       Latest Ref Rng & Units 04/22/2024    7:57 AM 03/24/2024    8:13 AM 03/03/2024    8:03 AM  CMP  Glucose 70 - 99 mg/dL 875  876  863   BUN 6 - 20 mg/dL 7  8  9    Creatinine 0.61 - 1.24 mg/dL 9.27  9.20  9.00   Sodium 135 - 145 mmol/L 138  138  140   Potassium 3.5 - 5.1 mmol/L 3.3  3.6  3.5   Chloride 98 - 111 mmol/L 107  106  105   CO2 22 - 32 mmol/L 23  25  25    Calcium  8.9 - 10.3 mg/dL 8.5  8.6  9.2   Total Protein 6.5 - 8.1 g/dL 6.7  7.2  7.3   Total Bilirubin 0.0 - 1.2 mg/dL 0.6  0.8  0.8   Alkaline Phos 38 - 126 U/L 89  86  99   AST 15 - 41 U/L 19  22  20    ALT 0 - 44 U/L 18  19  18       RADIOGRAPHIC STUDIES: I have personally reviewed the radiological images as listed and agreed with the findings in the report. CT ABDOMEN PELVIS W CONTRAST Result Date: 04/10/2024 CLINICAL DATA:  Rectal cancer restaging. EXAM: CT ABDOMEN AND PELVIS WITH CONTRAST TECHNIQUE: Multidetector CT imaging of the abdomen and pelvis was performed using the standard protocol following bolus administration of intravenous contrast. RADIATION DOSE REDUCTION: This exam was performed according to the departmental dose-optimization program which includes automated exposure control, adjustment of the mA  and/or kV according to patient size  and/or use of iterative reconstruction technique. CONTRAST:  OMNIPAQUE  IOHEXOL  350 MG/ML SOLN COMPARISON:  12/10/2023, 03/02/2023 FINDINGS: Lower chest: Heart is normal size.  Lung bases are clear. Hepatobiliary: Stable 1.1 cm hypodensity adjacent the border of the inferior right lobe of the liver. Gallbladder and biliary tree are normal. Pancreas: Normal. Spleen: Normal. Adrenals/Urinary Tract: Adrenal glands are normal. Kidneys are normal size without hydronephrosis or nephrolithiasis. Subcentimeter cyst lower pole right kidney unchanged. Ureters are normal. Bladder unchanged. Stomach/Bowel: Stomach and small bowel are normal. Appendix is normal. Colostomy over the lower abdomen just left of midline. Evidence of prior APR with surgical clips over the right perineum. No significant change in residual soft tissue density over the rectum measuring approximately 4.9 x 4.2 cm in AP and transverse dimension (previously 4.9 x 4.7 cm). This soft tissue density extends superiorly where it abuts the posterosuperior border of the bladder as this is unchanged. Soft tissue mass/lymph node over the right pelvic sidewall measuring 2.5 x 3.2 cm without significant change. Vascular/Lymphatic: Minimal calcified plaque over the abdominal aorta which is normal in caliber. Remaining vascular structures are unremarkable. Adenopathy over the abdomen. Reproductive: Prostate and seminal vesicles unchanged and not well-defined. Other: No significant free fluid. Musculoskeletal: No focal abnormality to suggest metastatic disease. IMPRESSION: 1. No acute findings in the abdomen/pelvis. 2. Stable postsurgical changes over the pelvis with evidence of prior APR with colostomy in this patient with history of rectal cancer. Stable residual soft tissue density over the rectum measuring 4.9 x 4.2 cm. Stable soft tissue mass/lymph node over the right pelvic sidewall measuring 2.5 x 3.2 cm. 3. Stable 1.1 cm  hypodensity adjacent the border of the inferior right lobe of the liver likely benign. 4. Aortic atherosclerosis. Aortic Atherosclerosis (ICD10-I70.0). Electronically Signed   By: Toribio Agreste M.D.   On: 04/10/2024 15:52   CT Angio Chest Pulmonary Embolism (PE) W or WO Contrast Result Date: 04/05/2024 CLINICAL DATA:  Hemoptysis.  History of rectal cancer. EXAM: CT ANGIOGRAPHY CHEST WITH CONTRAST TECHNIQUE: Multidetector CT imaging of the chest was performed using the standard protocol during bolus administration of intravenous contrast. Multiplanar CT image reconstructions and MIPs were obtained to evaluate the vascular anatomy. RADIATION DOSE REDUCTION: This exam was performed according to the departmental dose-optimization program which includes automated exposure control, adjustment of the mA and/or kV according to patient size and/or use of iterative reconstruction technique. CONTRAST:  OMNIPAQUE  IOHEXOL  350 MG/ML SOLN COMPARISON:  December 10, 2023. FINDINGS: Cardiovascular: Satisfactory opacification of the pulmonary arteries to the segmental level. No evidence of pulmonary embolism. 4 cm ascending thoracic aortic aneurysm. Normal heart size. No pericardial effusion. Mediastinum/Nodes: No enlarged mediastinal, hilar, or axillary lymph nodes. Thyroid gland, trachea, and esophagus demonstrate no significant findings. Lungs/Pleura: No pneumothorax or pleural effusion is noted. Stable small perifissural nodule seen along left oblique fissure laterally most consistent with benign intrapulmonary lymph node. Probable mild emphysematous disease. No acute abnormality seen. Upper Abdomen: No acute abnormality. Musculoskeletal: No chest wall abnormality. No acute or significant osseous findings. Review of the MIP images confirms the above findings. IMPRESSION: No definite evidence of pulmonary embolus. 4 cm ascending thoracic aortic aneurysm. Recommend annual imaging followup by CTA or MRA. This recommendation  follows 2010 ACCF/AHA/AATS/ACR/ASA/SCA/SCAI/SIR/STS/SVM Guidelines for the Diagnosis and Management of Patients with Thoracic Aortic Disease. Circulation. 2010; 121: Z733-z630. Aortic aneurysm NOS (ICD10-I71.9). Aortic Atherosclerosis (ICD10-I70.0) and Emphysema (ICD10-J43.9). Electronically Signed   By: Lynwood Landy Raddle M.D.   On: 04/05/2024 12:21

## 2024-04-22 NOTE — Assessment & Plan Note (Signed)
 Recommend patient to take vitamin D  2000 units daily

## 2024-04-22 NOTE — Assessment & Plan Note (Signed)
 Continue  losartan  25mg  daily. Recommend patient to monitor BP at home.  Follow up with PCP

## 2024-04-22 NOTE — Assessment & Plan Note (Signed)
 Chemotherapy plan as listed

## 2024-04-22 NOTE — Assessment & Plan Note (Signed)
 Continue  potassium to 40meq in AM and 20meq in PM.

## 2024-04-22 NOTE — Assessment & Plan Note (Signed)
Continue calcium 1000mg  daily

## 2024-04-22 NOTE — Patient Instructions (Signed)

## 2024-04-22 NOTE — Assessment & Plan Note (Signed)
 December 2024 right lower lobe segmental and lobar pulmonary embolism On  Xarelto  to 10mg  daily.

## 2024-04-22 NOTE — Assessment & Plan Note (Signed)
 locally advanced rectal cancer-Stage IIIB, s/p TNT neoadjuvant protocol, concurrent Xeloda  and radiation-Finished 08/27/2021, s/p  APR,rectal adenocarcinoma, [id number]. Positive radial margin due to perforation. Biopsy proven recurrent rectal cancer, locally advanced disease, possible colovesical fistula; no distant metastatic disease on PET- per [name] Surgeon Dr. Florie, recurrence is not resectable. --> June 2024 S/p concurrent Xeloda  825 mg/m2 twice daily  with radiation --> July 2024 MRI pelvis w wo contrast showed persistent disease. Disease is not resectable per colorectal surgeon --> FOLFIRI Panitumumab  --> Dec 2025 partial response. --> per pt's preference switched to Xeloda  + Panitumumab . [Pt declined 5-FU pump] Labs are reviewed and discussed with patient.  Proceed with next cycle Xeloda  850 mg/m2 twice daily on days 1 to 14 of a 21-day cycle plus Panitumumab  Q3 weeks. [Panel study regimen].  He tolerates regimen well.  Repeat CT in July

## 2024-04-23 LAB — CEA: CEA: 2.1 ng/mL (ref 0.0–4.7)

## 2024-04-29 ENCOUNTER — Ambulatory Visit: Admitting: Oncology

## 2024-04-29 ENCOUNTER — Other Ambulatory Visit

## 2024-04-29 ENCOUNTER — Ambulatory Visit

## 2024-05-03 ENCOUNTER — Other Ambulatory Visit: Payer: Self-pay

## 2024-05-13 ENCOUNTER — Encounter: Payer: Self-pay | Admitting: Oncology

## 2024-05-13 ENCOUNTER — Other Ambulatory Visit: Payer: Self-pay

## 2024-05-13 ENCOUNTER — Inpatient Hospital Stay

## 2024-05-13 ENCOUNTER — Inpatient Hospital Stay (HOSPITAL_BASED_OUTPATIENT_CLINIC_OR_DEPARTMENT_OTHER): Admitting: Oncology

## 2024-05-13 ENCOUNTER — Inpatient Hospital Stay: Attending: Oncology

## 2024-05-13 VITALS — BP 130/94 | HR 100 | Temp 96.9°F | Resp 18 | Wt 185.0 lb

## 2024-05-13 DIAGNOSIS — I10 Essential (primary) hypertension: Secondary | ICD-10-CM | POA: Diagnosis not present

## 2024-05-13 DIAGNOSIS — I2510 Atherosclerotic heart disease of native coronary artery without angina pectoris: Secondary | ICD-10-CM | POA: Insufficient documentation

## 2024-05-13 DIAGNOSIS — E876 Hypokalemia: Secondary | ICD-10-CM

## 2024-05-13 DIAGNOSIS — Z923 Personal history of irradiation: Secondary | ICD-10-CM | POA: Diagnosis not present

## 2024-05-13 DIAGNOSIS — C20 Malignant neoplasm of rectum: Secondary | ICD-10-CM

## 2024-05-13 DIAGNOSIS — Z79899 Other long term (current) drug therapy: Secondary | ICD-10-CM | POA: Insufficient documentation

## 2024-05-13 DIAGNOSIS — Z933 Colostomy status: Secondary | ICD-10-CM | POA: Diagnosis not present

## 2024-05-13 DIAGNOSIS — Z5112 Encounter for antineoplastic immunotherapy: Secondary | ICD-10-CM | POA: Diagnosis present

## 2024-05-13 DIAGNOSIS — Z86711 Personal history of pulmonary embolism: Secondary | ICD-10-CM

## 2024-05-13 DIAGNOSIS — E559 Vitamin D deficiency, unspecified: Secondary | ICD-10-CM

## 2024-05-13 DIAGNOSIS — Z5111 Encounter for antineoplastic chemotherapy: Secondary | ICD-10-CM

## 2024-05-13 DIAGNOSIS — Z7901 Long term (current) use of anticoagulants: Secondary | ICD-10-CM | POA: Insufficient documentation

## 2024-05-13 DIAGNOSIS — Z9221 Personal history of antineoplastic chemotherapy: Secondary | ICD-10-CM | POA: Diagnosis not present

## 2024-05-13 DIAGNOSIS — F17290 Nicotine dependence, other tobacco product, uncomplicated: Secondary | ICD-10-CM | POA: Diagnosis not present

## 2024-05-13 LAB — CMP (CANCER CENTER ONLY)
ALT: 22 U/L (ref 0–44)
AST: 26 U/L (ref 15–41)
Albumin: 3.6 g/dL (ref 3.5–5.0)
Alkaline Phosphatase: 87 U/L (ref 38–126)
Anion gap: 9 (ref 5–15)
BUN: <5 mg/dL — ABNORMAL LOW (ref 6–20)
CO2: 22 mmol/L (ref 22–32)
Calcium: 8.7 mg/dL — ABNORMAL LOW (ref 8.9–10.3)
Chloride: 106 mmol/L (ref 98–111)
Creatinine: 0.82 mg/dL (ref 0.61–1.24)
GFR, Estimated: >60 mL/min (ref >60)
Glucose, Bld: 142 mg/dL — ABNORMAL HIGH (ref 70–99)
Potassium: 3.6 mmol/L (ref 3.5–5.1)
Sodium: 137 mmol/L (ref 135–145)
Total Bilirubin: 0.5 mg/dL (ref 0.0–1.2)
Total Protein: 6.8 g/dL (ref 6.5–8.1)

## 2024-05-13 LAB — CBC WITH DIFFERENTIAL (CANCER CENTER ONLY)
Abs Immature Granulocytes: 0.01 K/uL (ref 0.00–0.07)
Basophils Absolute: 0 K/uL (ref 0.0–0.1)
Basophils Relative: 1 %
Eosinophils Absolute: 0.1 K/uL (ref 0.0–0.5)
Eosinophils Relative: 2 %
HCT: 45.8 % (ref 39.0–52.0)
Hemoglobin: 16.1 g/dL (ref 13.0–17.0)
Immature Granulocytes: 0 %
Lymphocytes Relative: 25 %
Lymphs Abs: 1.1 K/uL (ref 0.7–4.0)
MCH: 32.5 pg (ref 26.0–34.0)
MCHC: 35.2 g/dL (ref 30.0–36.0)
MCV: 92.3 fL (ref 80.0–100.0)
Monocytes Absolute: 0.7 K/uL (ref 0.1–1.0)
Monocytes Relative: 16 %
Neutro Abs: 2.4 K/uL (ref 1.7–7.7)
Neutrophils Relative %: 56 %
Platelet Count: 161 K/uL (ref 150–400)
RBC: 4.96 MIL/uL (ref 4.22–5.81)
RDW: 13.5 % (ref 11.5–15.5)
WBC Count: 4.3 K/uL (ref 4.0–10.5)
nRBC: 0 % (ref 0.0–0.2)

## 2024-05-13 LAB — MAGNESIUM: Magnesium: 1.5 mg/dL — ABNORMAL LOW (ref 1.7–2.4)

## 2024-05-13 MED ORDER — MAGNESIUM SULFATE 2 GM/50ML IV SOLN
2.0000 g | Freq: Once | INTRAVENOUS | Status: AC
  Administered 2024-05-13: 2 g via INTRAVENOUS
  Filled 2024-05-13: qty 50

## 2024-05-13 MED ORDER — LOSARTAN POTASSIUM 25 MG PO TABS
25.0000 mg | ORAL_TABLET | Freq: Every day | ORAL | 1 refills | Status: AC
  Filled 2024-05-13: qty 90, 90d supply, fill #0

## 2024-05-13 MED ORDER — SODIUM CHLORIDE 0.9 % IV SOLN
INTRAVENOUS | Status: DC
  Filled 2024-05-13: qty 250

## 2024-05-13 MED ORDER — MAGNESIUM CHLORIDE 64 MG PO TBEC
2.0000 | DELAYED_RELEASE_TABLET | Freq: Every day | ORAL | 2 refills | Status: AC
  Filled 2024-05-13: qty 60, 30d supply, fill #0

## 2024-05-13 MED ORDER — SODIUM CHLORIDE 0.9 % IV SOLN
9.0000 mg/kg | Freq: Once | INTRAVENOUS | Status: AC
  Administered 2024-05-13: 700 mg via INTRAVENOUS
  Filled 2024-05-13: qty 15

## 2024-05-13 NOTE — Patient Instructions (Signed)
 [facility] - A DEPT OF [facility]  Discharge Instructions: Thank you for choosing [facility] to provide your oncology and hematology care.  If you have a lab appointment with the Cancer Center, please go directly to the Cancer Center and check in at the registration area.  Wear comfortable clothing and clothing appropriate for easy access to any Portacath or PICC line.   We strive to give you quality time with your provider. You may need to reschedule your appointment if you arrive late (15 or more minutes).  Arriving late affects you and other patients whose appointments are after yours.  Also, if you miss three or more appointments without notifying the office, you may be dismissed from the clinic at the provider's discretion.      For prescription refill requests, have your pharmacy contact our office and allow 72 hours for refills to be completed.    Today you received the following chemotherapy and/or immunotherapy agents VECTIBEX and 2 GM MAGNESIUM       To help prevent nausea and vomiting after your treatment, we encourage you to take your nausea medication as directed.  BELOW ARE SYMPTOMS THAT SHOULD BE REPORTED IMMEDIATELY: *FEVER GREATER THAN 100.4 F (38 C) OR HIGHER *CHILLS OR SWEATING *NAUSEA AND VOMITING THAT IS NOT CONTROLLED WITH YOUR NAUSEA MEDICATION *UNUSUAL SHORTNESS OF BREATH *UNUSUAL BRUISING OR BLEEDING *URINARY PROBLEMS (pain or burning when urinating, or frequent urination) *BOWEL PROBLEMS (unusual diarrhea, constipation, pain near the anus) TENDERNESS IN MOUTH AND THROAT WITH OR WITHOUT PRESENCE OF ULCERS (sore throat, sores in mouth, or a toothache) UNUSUAL RASH, SWELLING OR PAIN  UNUSUAL VAGINAL DISCHARGE OR ITCHING   Items with * indicate a potential emergency and should be followed up as soon as possible or go to the Emergency Department if any problems should occur.  Please show the CHEMOTHERAPY ALERT CARD  or IMMUNOTHERAPY ALERT CARD at check-in to the Emergency Department and triage nurse.  Should you have questions after your visit or need to cancel or reschedule your appointment, please contact [facility] - A DEPT OF [facility]  [phone] and follow the prompts.  Office hours are 8:00 a.m. to 4:30 p.m. Monday - Friday. Please note that voicemails left after 4:00 p.m. may not be returned until the following business day.  We are closed weekends and major holidays. You have access to a nurse at all times for urgent questions. Please call the main number to the clinic [phone] and follow the prompts.  For any non-urgent questions, you may also contact your provider using MyChart. We now offer e-Visits for anyone 65 and older to request care online for non-urgent symptoms. For details visit [email or web address].   Also download the MyChart app! Go to the app store, search MyChart, open the app, select [facility], and log in with your MyChart username and password.  Panitumumab  Injection What is this medication? PANITUMUMAB  (pan i TOOM ue mab) treats colorectal cancer. It works by blocking a protein that causes cancer cells to grow and multiply. This helps to slow or stop the spread of cancer cells. It is a monoclonal antibody. This medicine may be used for other purposes; ask your health care provider or pharmacist if you have questions. COMMON BRAND NAME(S): Vectibix  What should I tell my care team before I take this medication? They need to know if you have any of these conditions: Eye disease Low  levels of magnesium  in the blood Lung disease An unusual or allergic reaction to panitumumab , other medications, foods, dyes, or preservatives Pregnant or trying to get pregnant Breast-feeding How should I use this medication? This medication is injected into a vein. It is given by your care team in a hospital or clinic setting. Talk to your care team  about the use of this medication in children. Special care may be needed. Overdosage: If you think you have taken too much of this medicine contact a poison control center or emergency room at once. NOTE: This medicine is only for you. Do not share this medicine with others. What if I miss a dose? Keep appointments for follow-up doses. It is important not to miss your dose. Call your care team if you are unable to keep an appointment. What may interact with this medication? Bevacizumab This list may not describe all possible interactions. Give your health care provider a list of all the medicines, herbs, non-prescription drugs, or dietary supplements you use. Also tell them if you smoke, drink alcohol, or use illegal drugs. Some items may interact with your medicine. What should I watch for while using this medication? Your condition will be monitored carefully while you are receiving this medication. This medication may make you feel generally unwell. This is not uncommon as chemotherapy can affect healthy cells as well as cancer cells. Report any side effects. Continue your course of treatment even though you feel ill unless your care team tells you to stop. This medication can make you more sensitive to the sun. Keep out of the sun while receiving this medication and for 2 months after stopping therapy. If you cannot avoid being in the sun, wear protective clothing and sunscreen. Do not use sun lamps, tanning beds, or tanning booths. Check with your care team if you have severe diarrhea, nausea, and vomiting or if you sweat a lot. The loss of too much body fluid may make it dangerous for you to take this medication. This medication may cause serious skin reactions. They can happen weeks to months after starting the medication. Contact your care team right away if you notice fevers or flu-like symptoms with a rash. The rash may be red or purple and then turn into blisters or peeling of the skin. You  may also notice a red rash with swelling of the face, lips, or lymph nodes in your neck or under your arms. Talk to your care team if you may be pregnant. Serious birth defects can occur if you take this medication during pregnancy and for 2 months after the last dose. Contraception is recommended while taking this medication and for 2 months after the last dose. Your care team can help you find the option that works for you. Do not breastfeed while taking this medication and for 2 months after the last dose. This medication may cause infertility. Talk to your care team if you are concerned about your fertility. What side effects may I notice from receiving this medication? Side effects that you should report to your care team as soon as possible: Allergic reactions--skin rash, itching, hives, swelling of the face, lips, tongue, or throat Dry cough, shortness of breath or trouble breathing Eye pain, redness, irritation, or discharge with blurry or decreased vision Infusion reactions--chest pain, shortness of breath or trouble breathing, feeling faint or lightheaded Low magnesium  level--muscle pain or cramps, unusual weakness or fatigue, fast or irregular heartbeat, tremors Low potassium level--muscle pain or cramps, unusual weakness  or fatigue, fast or irregular heartbeat, constipation Redness, blistering, peeling, or loosening of the skin, including inside the mouth Skin reactions on sun-exposed areas Side effects that usually do not require medical attention (report to your care team if they continue or are bothersome): Change in nail shape, thickness, or color Diarrhea Dry skin Fatigue Nausea Vomiting This list may not describe all possible side effects. Call your doctor for medical advice about side effects. You may report side effects to FDA at 1-800-FDA-1088. Where should I keep my medication? This medication is given in a hospital or clinic. It will not be stored at home. NOTE: This  sheet is a summary. It may not cover all possible information. If you have questions about this medicine, talk to your doctor, pharmacist, or health care provider.  2024 Elsevier/Gold Standard (2022-01-08 00:00:00)  Magnesium  Sulfate Injection What is this medication? MAGNESIUM  SULFATE (mag NEE zee um SUL fate) prevents and treats low levels of magnesium  in your body. It may also be used to prevent and treat seizures during pregnancy in people with high blood pressure disorders, such as preeclampsia or eclampsia. Magnesium  plays an important role in maintaining the health of your muscles and nervous system. This medicine may be used for other purposes; ask your health care provider or pharmacist if you have questions. What should I tell my care team before I take this medication? They need to know if you have any of these conditions: Heart disease History of irregular heart beat Kidney disease An unusual or allergic reaction to magnesium  sulfate, medications, foods, dyes, or preservatives Pregnant or trying to get pregnant Breast-feeding How should I use this medication? This medication is for infusion into a vein. It is given in a hospital or clinic setting. Talk to your care team about the use of this medication in children. While this medication may be prescribed for selected conditions, precautions do apply. Overdosage: If you think you have taken too much of this medicine contact a poison control center or emergency room at once. NOTE: This medicine is only for you. Do not share this medicine with others. What if I miss a dose? This does not apply. What may interact with this medication? Certain medications for anxiety or sleep Certain medications for seizures, such phenobarbital Digoxin Medications that relax muscles for surgery Narcotic medications for pain This list may not describe all possible interactions. Give your health care provider a list of all the medicines, herbs,  non-prescription drugs, or dietary supplements you use. Also tell them if you smoke, drink alcohol, or use illegal drugs. Some items may interact with your medicine. What should I watch for while using this medication? Your condition will be monitored carefully while you are receiving this medication. You may need blood work done while you are receiving this medication. What side effects may I notice from receiving this medication? Side effects that you should report to your care team as soon as possible: Allergic reactions--skin rash, itching, hives, swelling of the face, lips, tongue, or throat High magnesium  level--confusion, drowsiness, facial flushing, redness, sweating, muscle weakness, fast or irregular heartbeat, trouble breathing Low blood pressure--dizziness, feeling faint or lightheaded, blurry vision Side effects that usually do not require medical attention (report to your care team if they continue or are bothersome): Headache Nausea This list may not describe all possible side effects. Call your doctor for medical advice about side effects. You may report side effects to FDA at 1-800-FDA-1088. Where should I keep my medication? This medication  is given in a hospital or clinic and will not be stored at home. NOTE: This sheet is a summary. It may not cover all possible information. If you have questions about this medicine, talk to your doctor, pharmacist, or health care provider.  2024 Elsevier/Gold Standard (2021-05-08 00:00:00)

## 2024-05-13 NOTE — Assessment & Plan Note (Signed)
Continue calcium 1000mg  daily

## 2024-05-13 NOTE — Assessment & Plan Note (Signed)
 Continue  losartan  25mg  daily. Recommend patient to monitor BP at home.  Follow up with PCP

## 2024-05-13 NOTE — Assessment & Plan Note (Signed)
 December 2024 right lower lobe segmental and lobar pulmonary embolism On  Xarelto  to 10mg  daily. Patient reports being compliant.

## 2024-05-13 NOTE — Assessment & Plan Note (Signed)
 Increase slow Mag 2 tab daily.  IV Mag 2g x 1

## 2024-05-13 NOTE — Assessment & Plan Note (Signed)
 Chemotherapy plan as listed

## 2024-05-13 NOTE — Assessment & Plan Note (Signed)
 Continue  potassium to 40meq in AM and 20meq in PM.

## 2024-05-13 NOTE — Progress Notes (Signed)
 Hematology/Oncology Progress note [phone] [phone]      CHIEF COMPLAINTS/REASON FOR VISIT:  Follow up for recurrent rectal cancer treatment  ASSESSMENT & PLAN:   Cancer Staging  Rectal cancer [facility]) Staging form: Colon and Rectum, AJCC 8th Edition - Clinical stage from 02/08/2021: Stage IIIB (cT4a, cN1, cM0) - Signed by Babara Call, MD on 03/06/2021   Rectal cancer [facility]) locally advanced rectal cancer-Stage IIIB, s/p TNT neoadjuvant protocol, concurrent Xeloda  and radiation-Finished 08/27/2021, s/p  APR,rectal adenocarcinoma, ypT3N0. Positive radial margin due to perforation. Biopsy proven recurrent rectal cancer, locally advanced disease, possible colovesical fistula; no distant metastatic disease on PET- per [name] Surgeon Dr. Florie, recurrence is not resectable. --> June 2024 S/p concurrent Xeloda  825 mg/m2 twice daily  with radiation --> July 2024 MRI pelvis w wo contrast showed persistent disease. Disease is not resectable per colorectal surgeon --> FOLFIRI Panitumumab  --> Dec 2025 partial response. --> per pt's preference switched to Xeloda  + Panitumumab . [Pt declined 5-FU pump] Labs are reviewed and discussed with patient.  Proceed with next cycle Xeloda  850 mg/m2 twice daily on days 1 to 14 of a 21-day cycle plus Panitumumab  Q3 weeks. [Panel study regimen].  He tolerates regimen well.    Encounter for antineoplastic chemotherapy Chemotherapy plan as listed  History of pulmonary embolism December 2024 right lower lobe segmental and lobar pulmonary embolism On  Xarelto  to 10mg  daily. Patient reports being compliant.    Hypertension Continue  losartan  25mg  daily. Recommend patient to monitor BP at home.  Follow up with PCP  Hypocalcemia Continue calcium  1000mg  daily.    Hypokalemia Continue  potassium to 40meq in AM and 20meq in PM.    Hypomagnesemia Increase slow Mag 2 tab daily.  IV Mag 2g x 1   Vitamin D  deficiency Recommend patient to  take vitamin D  2000 units daily        Orders Placed This Encounter  Procedures   Magnesium     Standing Status:   Future    Expected Date:   06/24/2024    Expiration Date:   06/24/2025   CEA    Standing Status:   Future    Expected Date:   06/24/2024    Expiration Date:   06/24/2025   CBC with Differential (Cancer Center Only)    Standing Status:   Future    Expected Date:   06/24/2024    Expiration Date:   06/24/2025   CMP (Cancer Center only)    Standing Status:   Future    Expected Date:   06/24/2024    Expiration Date:   06/24/2025    Follow-up  3 weeks All questions were answered. The patient knows to call the clinic with any problems, questions or concerns.  Call Babara, MD, PhD [facility] Hematology Oncology 05/13/2024     HISTORY OF PRESENTING ILLNESS:   Barry Muska. is a  58 y.o.  male presents for rectal cancer Oncology History  Rectal cancer (HCC)  02/08/2021 Initial Diagnosis   Rectal cancer   02/05/2021-02/06/2021 patient was hospitalized due to generalized weakness, intermittent lightheadedness, weight loss and worsening constipation.  02/05/2021 CT abdomen showed concerning of severe rectal wall thickening and right internal iliac lymph node concerning for metastatic disease.  Patient was seen by gastroenterology and had colonoscopy which showed a circumferential fungating mass in the rectum.  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   02/14/2021-02/16/2021 hospitalized due to rectal bleeding and pain.   02/14/2021 CT showed perirectal fluid  collection/gas concerning for infection with significant leukocytosis, anemia with hemoglobin of 6.8.  Patient received PRBC transfusion, IV antibiotics.  He underwent an IR guided placement of JP drain into the rectal abscess.  Discharged home with oral Augmentin . 02/20/2021 presented to ER with new skin opening and draining from left gluteus.  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  JP  drain has been removed.  Patient was continued on Augmentin .   02/13/2021, PET scan showed locally advanced rectal cancer with billowing of the mesorectum now with low attenuation material that was not present on previous examination with extensive stranding and inflammation.   Bulky RIGHT pelvic sidewall/hypogastric lymph node outside of the mesorectum with stippled calcification measuring 19 mm-no increased metabolic activity. Small LEFT hypogastric lymph node just peripheral to the internal external bifurcation-SUV 3.2 High RIGHT internal just below or at the internal/common iliac transition, lymph node -SUV 4.8 LEFT high hypogastric lymph node  8 mm-SUV 3. Scattered lymph nodes throughout the retroperitoneum with low FDG uptake Bilateral inguinal lymph nodes largest on the RIGHT (image 248/3) 11 mm with a maximum SUV of 2.8 spiculated nodule in the LEFT upper lobe- 9 x 8 mm       02/14/2021, CT abdomen pelvis without contrast Showed perforated rectal mass with contained perforation with fluid and gas extending above and below the pelvic floor,potentially involving the sphincter complex and extending into LEFT ischial rectal fossa   02/20/2021, CT pelvis with contrast showed Large perirectal/perianal abscess has markedly decreased in size since placement of the percutaneous drain. There are residual gas-filled collections in the soft tissues and suspect there is a fistula or sinus tract between the rectal mass and the subcutaneous tissues. Soft tissue gas along the medial left buttock and concern for a cutaneous ulceration in this area. Large rectal mass with evidence for a large necrotic right pelvic lymph node.   His case is complicated with a perirectal abscess. Prior to onset of perirectal abscess, on his 02/05/2021 scan, he was noted to have right internal iliac lymph node 3 x 1 x 2.3 cm which is concerning for nodal disease.On Subsequent images it was difficult to distinguish whether  lymphadenopathy was due to nodal disease versus acute inflammation. 03/07/2021 MRI pelvis cT3 N2. Due to the possible contained perforation on previous CT, possible cT4 disease.    02/27/2021 medi port placed by Dr.Dew 03/13/2021 reports right butt cheek and also left perianal area fullness.he was seen by Dr.Pabon urgently  and had right buttock abscess drained. Left perianal fullness was felt to be due to cancer.    02/08/2021 Cancer Staging   Staging form: Colon and Rectum, AJCC 8th Edition - Clinical stage from 02/08/2021: Stage IIIB (cT4a, cN1, cM0) - Signed by Babara Call, MD on 03/06/2021 Stage prefix: Initial diagnosis   02/27/2021 Procedure   medi port placed by Dr.Dew   03/18/2021 - 07/05/2021 Chemotherapy    FOLFOX q14d x 4 months      07/17/2021 - 08/27/2021 Chemotherapy   Xeloda  concurrent with Radiation   11/18/2021 Surgery   patient is status post open APR with flap for rectal cancer. Pathology rectal adenomacarcinoma ypT3N0.  Positive margin: Radial (circumferential) or mesenteric: adjacent to perforation      01/27/2022 - 02/02/2022 Hospital Admission   Hospitalized at [facility] due to recurrent abscess.  US  guided drain into the pelvic abscess with return of 60 ml of purulent fluid. Cultures positive for staph aureus and strep agalactiae group b, fungal cultures negative.  02/25/2022 Imaging   CT chest angiogram with and without contrast, CT abdomen pelvis with and without contrast 1. Negative for acute pulmonary embolus.2. Emphysema. Further decrease in size of the previously noted irregular nodule in the left upper lobe which is now barely measurable today. No new suspicious lung nodules 3. Status post left lower quadrant colostomy. Further decrease in size since comparison exam from May of the rim enhancing gas and fluid collection at the pelvic surgical bed extending from the perineum superiorly into the pelvis 4. Slightly thickened appearance of terminal ileal small bowel loops in  the pelvis with mild stranding suggesting small bowel inflammatory process. 5. Stable enlarged right pelvic sidewall lymph node   06/02/2022 Imaging   CT chest abdomen pelvis w contrast 1. Status post abdominal perineal resection with descending colostomy. 2. At the site of pelvic fluid and gas collection on 02/25/2022, there is residual, decreased presacral soft tissue fullness, without drainable collection. 3. Similar right obturator nodal metastasis.4.  No acute process or evidence of metastatic disease in the chest. 5. Age advanced coronary artery atherosclerosis. Recommend assessment of coronary risk factors. 6. Aortic atherosclerosis and emphysema     06/26/2022 Imaging   CT abdomen pelvis with contrast showed 1. Interval increase in size of a lobulated partially imaged at least 7.8 x 3.8 x 12 cm abscess in the perianal region in a patient status post abdominal perineal resection and left lower lobe end colostomy formation. Finding extends to involve the pre sacral region up to the urinary dome level. Associated posterior urinary bladder wall thickening with lack of intraperitoneal fat plane between the presacral soft tissue thickening/abscess formation suggestive of possible fistulization and invasion of the posterior bladder wall. Underlying recurrent malignancy is not excluded. 2. Stable 2.7 cm right pelvic sidewall lymph node. 3.  Aortic Atherosclerosis   06/26/2022 - 06/27/2022 Hospital Admission   He went to [facility] ER and CT showed recurrent abscess. He was transferred to [facility] due to recurrent abscess, treated with IV vanc/zosyn , IR was consulted and drainage tube was placed. Wound grew up Strep viridans. Patient was seen by ID at [facility] discharged with Augmentin  for 2 weeks.   He was seen by wound care Dr> Georgie on 07/09/2022, JP drain was removed.    09/29/2022 Imaging   MRI pelvis without contrast showed  Interval abdominoperitoneal resection since prior MRI. Decreased small  fluid collection in the surgical bed compared with more recent CT, consistent with resolving postoperative fluid collection or abscess.   Bulky rounded presacral mass, which shows diffuse restricted diffusion, without significant change in size since most recent CT of 06/26/2022. This raises suspicion for recurrent carcinoma over post treatment changes. Recommend correlation with CEA level, and consider tissue sampling or PET-CT.   Stable enlarged right pelvic sidewall lymph node. No new or increased adenopathy identified.   10/09/2022 Imaging   CT chest with contrast showed 1. Unchanged 0.4 cm fissural nodule of the anterior left lower lobe. This is almost certainly a benign fissural lymph node. No new or suspicious pulmonary nodules. 2. Moderate emphysema and diffuse bilateral bronchial wall thickening. 3. Coronary artery disease.   10/13/2022 Imaging   CT abdomen pelvis with contrast showed 1. Complex collection persists in the lower pelvis, extending from the anal verge upwards into the presacral space and anteriorly from the presacral space to the bladder dome, not significantly changed in size or extent compared to the earlier CT of 06/26/2022. This is most likely a combination of a postoperative seroma  and/or chronic phlegmon/abscess versus recurrent abscess. 2. Bladder walls are thick walled/edematous. Given the contiguity of the presacral collection and the bladder dome, this is highly suspicious for a related bladder wall infection and possibly secondary to colovesical fistula. Recommend correlation with urinalysis. 3. No evidence of bowel obstruction. LEFT lower abdominal wall colostomy, without obstruction or inflammatory change. 4. No free intraperitoneal air   10/13/2022 [facility] Admission   Hospitalized due to fever and rectal pain. Urine culture showed mixed urogenital flora. IR CT guided Aspiration of abscess showed Streptococcal Viridans. He was placed back on Augmentin   10/14/2023  CT read by [facility] radiology Postsurgical changes of abdominoperineal resection. Interval enlargement of  fluid collection in the surgical bed. Multiple foci of enhancing tissue at  the margins of this fluid collection, increased from prior study, suspicious for local recurrence.   Prominent retroperitoneal lymph nodes, some which are increased in size from prior study. These are indeterminate and may be either reactive or represent metastatic disease. Recommend continued attention on follow-up.   Centrally hypoattenuating right obturator lymph node, unchanged in size, consistent with treated disease.   Circumferential bladder wall thickening and irregularity, most pronounced on the left side. This may be reactive in the setting of adjacent pelvic inflammatory changes. Correlate with urinalysis if there is clinical  concern for urinary tract infection.    11/14/2022 Imaging   MRI pelvis w wo contrast  1. Status post abdominoperineal resection with left lower quadrant end colostomy. 2. Compared to prior CT and MR, no significant change in appearance of heterogeneous, rim enhancing presacral and low pelvic soft tissue and fluid. Largest heterogeneously enhancing conglomerate of fluid and soft tissue appears to closely involve the anteriorly abutting the seminal vesicles and measures 3.0 x 2.9 cm in largest axial dimension. This extends to superiorly contact the posterior bladder dome and inferiorly towards the gluteal cleft. 3. Discrete fluid component at the most inferior extent measuring 2.4 x 1.3 cm. This is markedly diminished in volume compared to more remote previous examinations, for example 06/26/2022. 4. Constellation of findings is highly concerning for locally recurrent rectal malignancy with or without superimposed infection. Presence or absence of infection at this time is not established by MR. 5. Additional unchanged hemorrhagic or proteinaceous fluid collection in the right hemipelvis  measuring 3.2 x 2.6 cm most consistent with postoperative hematoma or seroma. 6. Unchanged thickening of the urinary bladder wall. As previously reported, fistula or involvement of bladder wall by malignancy not excluded.   12/09/2022 Procedure   CT guided biopsy of the pelvic mass showed Moderately differentiated adenocarcinoma with dirty necrosis, morphologically identical to patient's prior rectal adenocarcinoma.   [facility] NGS xT 648 panel showed MLH3 start loss,  BCORL1 frame shift, TP53 splice region varian, APC stop gain,  TMB 7.9, MS stable, KRAS/BRAF/NRAS negative.   [facility] NGS xR showed no gene arrangement or reportable altered splicing events in RNA sequencing      01/07/2023 -  Chemotherapy   Xeloda  825 mg/m2 twice daily  with radiation   02/11/2023 Imaging   CT abdomen pelvis w contrast  1. Examination is positive for small bowel obstruction. Transition point is identified within the left iliac fossa. Distal to the transition point the small bowel loops exhibit mucosal enhancement and wall thickening concerning for enteritis. 2. There is new right-sided hydronephrosis and hydroureter up to the level of the bifurcation of right common iliac artery. The distal right ureter appears closely associated with the previously characterized tracer avid  presacral soft tissue mass. Cannot exclude obstructive uropathy secondary to tumor involvement. 3. Similar appearance of presacral soft tissue mass. This was tracer avid on the recent PET-CT from 12/17/2022, and concerning for locally recurrent tumor. 4. Unchanged diffuse circumferential wall thickening involving the urinary bladder. 5. Aortic Atherosclerosis    04/17/2023 - 07/29/2023 Chemotherapy   Patient is on Treatment Plan : COLORECTAL FOLFIRI + Panitumumab  q14d     08/14/2023 Imaging   CT chest abdomen pelvis w contrast showed  Heterogeneous central pelvic mass in the area of the rectum overall similar configuration to the previous CT  scan. Again the lesion abuts the bladder in the area of the seminal vesicles. Extension up along the presacral space. Surgical changes from diverting colostomy.   Essentially stable right pelvic sidewall soft tissue mass or nodal enlargement. No new nodal enlargement elsewhere in the chest, abdomen or pelvis.   Fatty liver infiltration.   Subtle nodular opacity along the right lung base. Infiltrates possible. Please correlate with symptoms and recommend follow-up.   Incidental right lower lobe segmental and lobar pulmonary embolism.     09/23/2023 -  Chemotherapy   Patient is on Treatment Plan : COLORECTAL Xeloda  + Panitumumab  q21d     12/10/2023 Imaging   CT chest abdomen pelvis w contrast showed 1. Similar appearance of soft tissue mass within the posterior pelvis in the region of the rectum. This is stable to mildly decreased in the interval. As noted previously tumor extends along the presacral soft tissue space and abuts the posterior dome of bladder and posterior bladder base. 2. Stable appearance of soft tissue mass/enlarged lymph node within the right posterior pelvic sidewall. 3. No new or progressive disease identified. 4.  Aortic Atherosclerosis (ICD10-I70.0).       INTERVAL HISTORY Barry [name]. is a 58 y.o. male who has above history reviewed by me today presents for follow up visit for management of recurrent locally advanced rectal cancer. Denies fever chills, rectal pain. Denies rectal discharge.   Intermittent diarrhea  Denies nausea vomiting or abdominal pain.  No leg swelling. He takes Xarelto  10mg  daily + mild SOB after exertion.       Review of Systems  Constitutional:  Positive for fatigue. Negative for appetite change, chills, fever and unexpected weight change.  HENT:   Negative for hearing loss and voice change.   Eyes:  Negative for eye problems and icterus.  Respiratory:  Positive for shortness of breath. Negative for chest tightness,  cough and hemoptysis.   Cardiovascular:  Negative for chest pain and leg swelling.  Gastrointestinal:  Negative for abdominal distention, abdominal pain, blood in stool, constipation, diarrhea and rectal pain.  Endocrine: Negative for hot flashes.  Genitourinary:  Negative for difficulty urinating, dysuria and frequency.   Musculoskeletal:  Negative for arthralgias.  Skin:  Negative for itching and rash.  Neurological:  Negative for light-headedness and numbness.  Hematological:  Negative for adenopathy. Does not bruise/bleed easily.  Psychiatric/Behavioral:  Negative for confusion.     MEDICAL HISTORY:  Past Medical History:  Diagnosis Date   Headache    Left shoulder pain    Rectal cancer (HCC)     SURGICAL HISTORY: Past Surgical History:  Procedure Laterality Date   COLON SURGERY     COLONOSCOPY WITH PROPOFOL  N/A 02/06/2021   Procedure: COLONOSCOPY WITH PROPOFOL ;  Surgeon: Unk Corinn Skiff, MD;  Location: [facility] ENDOSCOPY;  Service: Gastroenterology;  Laterality: N/A;   PORTA CATH INSERTION N/A 02/27/2021   Procedure:  PORTA CATH INSERTION;  Surgeon: Marea Selinda RAMAN, MD;  Location: [facility] INVASIVE CV LAB;  Service: Cardiovascular;  Laterality: N/A;   ROTATOR CUFF REPAIR Left     SOCIAL HISTORY: Social History   Socioeconomic History   Marital status: Married    Spouse name: Not on file   Number of children: Not on file   Years of education: Not on file   Highest education level: Not on file  Occupational History   Not on file  Tobacco Use   Smoking status: Former    Current packs/day: 0.00    Average packs/day: 1 pack/day for 10.0 years (10.0 ttl pk-yrs)    Types: Cigars, Cigarettes    Start date: 02/05/2011    Quit date: 02/04/2021    Years since quitting: 3.2   Smokeless tobacco: Never  Substance and Sexual Activity   Alcohol use: Not Currently   Drug use: No   Sexual activity: Not on file  Other Topics Concern   Not on file  Social History Narrative   Not on  file   Social Drivers of Health   Financial Resource Strain: High Risk (02/13/2023)   Received from [facility]   Overall Financial Resource Strain (CARDIA)    Difficulty of Paying Living Expenses: Hard  Food Insecurity: No Food Insecurity (02/13/2023)   Received from [facility]   Hunger Vital Sign    Within the past 12 months, you worried that your food would run out before you got the money to buy more.: Never true    Within the past 12 months, the food you bought just didn't last and you didn't have money to get more.: Never true  Transportation Needs: No Transportation Needs (02/13/2023)   Received from [facility] - Transportation    In the past 12 months, has lack of transportation kept you from medical appointments or from getting medications?: No    Lack of Transportation (Non-Medical): No  Physical Activity: Inactive (11/03/2022)   Exercise Vital Sign    Days of Exercise per Week: 0 days    Minutes of Exercise per Session: 0 min  Stress: Stress Concern Present (11/03/2022)   [employer] of Occupational Health - Occupational Stress Questionnaire    Feeling of Stress : Rather much  Social Connections: Moderately Isolated (11/03/2022)   Social Connection and Isolation Panel    Frequency of Communication with Friends and Family: Twice a week    Frequency of Social Gatherings with Friends and Family: Three times a week    Attends Religious Services: 1 to 4 times per year    Active Member of Clubs or Organizations: No    Attends Banker Meetings: Never    Marital Status: Separated  Intimate Partner Violence: Not At Risk (11/03/2022)   Humiliation, Afraid, Rape, and Kick questionnaire    Fear of Current or Ex-Partner: No    Emotionally Abused: No    Physically Abused: No    Sexually Abused: No    FAMILY HISTORY: Family History  Problem Relation Age of Onset   Cancer Sister    Diabetes Mother     Cancer Maternal Grandmother    Cancer Paternal Grandmother     ALLERGIES:  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  MEDICATIONS:  Current Outpatient Medications  Medication Sig Dispense Refill   calcium -vitamin D  (OSCAL WITH D) 500-5 MG-MCG tablet Take 2 tablets by mouth daily. 60 tablet 3   capecitabine  (XELODA ) 150  MG tablet TAKE 1 TABLET BY MOUTH 2 TIMES A DAY AFTER A MEAL FOR 14 DAYS ON, THEN 7 DAYS OFF, REPEAT EVERY 21 DAYS. (TOTAL DOSE OF 1,650 MG 2 TIMES A DAY) 28 tablet 1   capecitabine  (XELODA ) 500 MG tablet TAKE 3 TABLETS BY MOUTH 2 TIMES A DAY AFTER A MEAL FOR 14 DAYS ON, THEN 7 DAYS OFF, REPEAT EVERY 21 DAYS. (TOTAL DOSE OF 1,650 MG 2 TIMES A DAY) 84 tablet 1   Cholecalciferol  (VITAMIN D ) 50 MCG (2000 UT) CAPS Take 1 capsule (2,000 Units total) by mouth daily.     citalopram  (CELEXA ) 10 MG tablet Take 1 tablet (10 mg total) by mouth daily. 30 tablet 3   clindamycin  (CLINDAGEL ) 1 % gel Apply topically 2 (two) times daily. 30 g 4   lidocaine -prilocaine  (EMLA ) cream Apply 1 Application topically as needed. Apply small amount to port and cover with saran wrap 1-2 hours prior to port access 30 g 2   loperamide  (IMODIUM ) 2 MG capsule Take 1 capsule (2 mg total) by mouth See admin instructions. 90 capsule 2   ondansetron  (ZOFRAN ) 8 MG tablet Take 1 tablet (8 mg total) by mouth every 8 (eight) hours as needed for nausea or vomiting. 90 tablet 1   potassium chloride  SA (KLOR-CON  M) 20 MEQ tablet Take 2 tablets (40 mEq total) by mouth in the morning and take 1 tablet ( ) in the evening. 60 tablet 2   prochlorperazine  (COMPAZINE ) 10 MG tablet Take 1 tablet (10 mg total) by mouth every 6 (six) hours as needed for nausea or vomiting. 90 tablet 1   rivaroxaban  (XARELTO ) 10 MG TABS tablet Take 1 tablet (10 mg total) by mouth daily. 30 tablet 5   senna (SENOKOT) 8.6 MG TABS tablet Take 2 tablets (17.2 mg total) by mouth daily. 100 tablet 0   traZODone  (DESYREL ) 50 MG tablet Take 1 tablet (50 mg  total) by mouth at bedtime as needed for sleep. 30 tablet 3   losartan  (COZAAR ) 25 MG tablet Take 1 tablet (25 mg total) by mouth daily. 90 tablet 1   magnesium  chloride (SLOW-MAG) 64 MG TBEC SR tablet Take 2 tablets (128 mg total) by mouth daily. 60 tablet 2   No current facility-administered medications for this visit.   Facility-Administered Medications Ordered in Other Visits  Medication Dose Route Frequency Provider Last Rate Last Admin   0.9 %  sodium chloride  infusion   Intravenous Continuous Babara Call, MD 10 mL/hr at 05/13/24 0948 New Bag at 05/13/24 0948   heparin  lock flush 100 unit/mL  500 Units Intravenous Once Babara Call, MD         PHYSICAL EXAMINATION: ECOG PERFORMANCE STATUS: 1 - Symptomatic but completely ambulatory Vitals:   05/13/24 0847 05/13/24 0853  BP: (!) 144/98 (!) 130/94  Pulse: 100   Resp: 18   Temp: (!) 96.9 F (36.1 C)   SpO2: 100%      Filed Weights   05/13/24 0847  Weight: 185 lb (83.9 kg)       Physical Exam HENT:     Head: Normocephalic and atraumatic.  Eyes:     General: No scleral icterus. Cardiovascular:     Rate and Rhythm: Normal rate and regular rhythm.     Heart sounds: Normal heart sounds.  Pulmonary:     Effort: Pulmonary effort is normal. No respiratory distress.     Breath sounds: No wheezing.     Comments: Decreased breath sound bilaterally.  Abdominal:  General: Bowel sounds are normal. There is no distension.     Palpations: Abdomen is soft.     Comments: Colostomy bag  Genitourinary:    Comments: History of APR, Musculoskeletal:        General: No deformity. Normal range of motion.     Cervical back: Normal range of motion and neck supple.  Skin:    General: Skin is warm and dry.     Findings: No erythema.  Neurological:     Mental Status: He is alert and oriented to person, place, and time. Mental status is at baseline.     Cranial Nerves: No cranial nerve deficit.  Psychiatric:        Mood and Affect: Mood  normal.     LABORATORY DATA:  I have reviewed the data as listed    Latest Ref Rng & Units 05/13/2024    8:36 AM 04/22/2024    7:57 AM 03/24/2024    8:13 AM  CBC  WBC 4.0 - 10.5 K/uL 4.3  4.2  4.2   Hemoglobin 13.0 - 17.0 g/dL 83.8  84.5  84.2   Hematocrit 39.0 - 52.0 % 45.8  43.7  46.3   Platelets 150 - 400 K/uL 161  183  204       Latest Ref Rng & Units 05/13/2024    8:36 AM 04/22/2024    7:57 AM 03/24/2024    8:13 AM  CMP  Glucose 70 - 99 mg/dL 857  875  876   BUN 6 - 20 mg/dL 5  7  8    Creatinine 0.61 - 1.24 mg/dL 9.17  9.27  9.20   Sodium 135 - 145 mmol/L 137  138  138   Potassium 3.5 - 5.1 mmol/L 3.6  3.3  3.6   Chloride 98 - 111 mmol/L 106  107  106   CO2 22 - 32 mmol/L 22  23  25    Calcium  8.9 - 10.3 mg/dL 8.7  8.5  8.6   Total Protein 6.5 - 8.1 g/dL 6.8  6.7  7.2   Total Bilirubin 0.0 - 1.2 mg/dL 0.5  0.6  0.8   Alkaline Phos 38 - 126 U/L 87  89  86   AST 15 - 41 U/L 26  19  22    ALT 0 - 44 U/L 22  18  19       RADIOGRAPHIC STUDIES: I have personally reviewed the radiological images as listed and agreed with the findings in the report. CT ABDOMEN PELVIS W CONTRAST Result Date: 04/10/2024 CLINICAL DATA:  Rectal cancer restaging. EXAM: CT ABDOMEN AND PELVIS WITH CONTRAST TECHNIQUE: Multidetector CT imaging of the abdomen and pelvis was performed using the standard protocol following bolus administration of intravenous contrast. RADIATION DOSE REDUCTION: This exam was performed according to the departmental dose-optimization program which includes automated exposure control, adjustment of the mA and/or kV according to patient size and/or use of iterative reconstruction technique. CONTRAST:  OMNIPAQUE  IOHEXOL  350 MG/ML SOLN COMPARISON:  12/10/2023, 03/02/2023 FINDINGS: Lower chest: Heart is normal size.  Lung bases are clear. Hepatobiliary: Stable 1.1 cm hypodensity adjacent the border of the inferior right lobe of the liver. Gallbladder and biliary tree are normal.  Pancreas: Normal. Spleen: Normal. Adrenals/Urinary Tract: Adrenal glands are normal. Kidneys are normal size without hydronephrosis or nephrolithiasis. Subcentimeter cyst lower pole right kidney unchanged. Ureters are normal. Bladder unchanged. Stomach/Bowel: Stomach and small bowel are normal. Appendix is normal. Colostomy over the lower abdomen just left of  midline. Evidence of prior APR with surgical clips over the right perineum. No significant change in residual soft tissue density over the rectum measuring approximately 4.9 x 4.2 cm in AP and transverse dimension (previously 4.9 x 4.7 cm). This soft tissue density extends superiorly where it abuts the posterosuperior border of the bladder as this is unchanged. Soft tissue mass/lymph node over the right pelvic sidewall measuring 2.5 x 3.2 cm without significant change. Vascular/Lymphatic: Minimal calcified plaque over the abdominal aorta which is normal in caliber. Remaining vascular structures are unremarkable. Adenopathy over the abdomen. Reproductive: Prostate and seminal vesicles unchanged and not well-defined. Other: No significant free fluid. Musculoskeletal: No focal abnormality to suggest metastatic disease. IMPRESSION: 1. No acute findings in the abdomen/pelvis. 2. Stable postsurgical changes over the pelvis with evidence of prior APR with colostomy in this patient with history of rectal cancer. Stable residual soft tissue density over the rectum measuring 4.9 x 4.2 cm. Stable soft tissue mass/lymph node over the right pelvic sidewall measuring 2.5 x 3.2 cm. 3. Stable 1.1 cm hypodensity adjacent the border of the inferior right lobe of the liver likely benign. 4. Aortic atherosclerosis. Aortic Atherosclerosis (ICD10-I70.0). Electronically Signed   By: Toribio Agreste M.D.   On: 04/10/2024 15:52   CT Angio Chest Pulmonary Embolism (PE) W or WO Contrast Result Date: 04/05/2024 CLINICAL DATA:  Hemoptysis.  History of rectal cancer. EXAM: CT ANGIOGRAPHY  CHEST WITH CONTRAST TECHNIQUE: Multidetector CT imaging of the chest was performed using the standard protocol during bolus administration of intravenous contrast. Multiplanar CT image reconstructions and MIPs were obtained to evaluate the vascular anatomy. RADIATION DOSE REDUCTION: This exam was performed according to the departmental dose-optimization program which includes automated exposure control, adjustment of the mA and/or kV according to patient size and/or use of iterative reconstruction technique. CONTRAST:  OMNIPAQUE  IOHEXOL  350 MG/ML SOLN COMPARISON:  December 10, 2023. FINDINGS: Cardiovascular: Satisfactory opacification of the pulmonary arteries to the segmental level. No evidence of pulmonary embolism. 4 cm ascending thoracic aortic aneurysm. Normal heart size. No pericardial effusion. Mediastinum/Nodes: No enlarged mediastinal, hilar, or axillary lymph nodes. Thyroid gland, trachea, and esophagus demonstrate no significant findings. Lungs/Pleura: No pneumothorax or pleural effusion is noted. Stable small perifissural nodule seen along left oblique fissure laterally most consistent with benign intrapulmonary lymph node. Probable mild emphysematous disease. No acute abnormality seen. Upper Abdomen: No acute abnormality. Musculoskeletal: No chest wall abnormality. No acute or significant osseous findings. Review of the MIP images confirms the above findings. IMPRESSION: No definite evidence of pulmonary embolus. 4 cm ascending thoracic aortic aneurysm. Recommend annual imaging followup by CTA or MRA. This recommendation follows 2010 ACCF/AHA/AATS/ACR/ASA/SCA/SCAI/SIR/STS/SVM Guidelines for the Diagnosis and Management of Patients with Thoracic Aortic Disease. Circulation. 2010; 121: [id number]. Aortic aneurysm NOS (ICD10-I71.9). Aortic Atherosclerosis (ICD10-I70.0) and Emphysema (ICD10-J43.9). Electronically Signed   By: Lynwood Landy Raddle M.D.   On: 04/05/2024 12:21

## 2024-05-13 NOTE — Assessment & Plan Note (Signed)
 Recommend patient to take vitamin D  2000 units daily

## 2024-05-13 NOTE — Assessment & Plan Note (Signed)
 locally advanced rectal cancer-Stage IIIB, s/p TNT neoadjuvant protocol, concurrent Xeloda  and radiation-Finished 08/27/2021, s/p  APR,rectal adenocarcinoma, [id number]. Positive radial margin due to perforation. Biopsy proven recurrent rectal cancer, locally advanced disease, possible colovesical fistula; no distant metastatic disease on PET- per [name] Surgeon Dr. Florie, recurrence is not resectable. --> June 2024 S/p concurrent Xeloda  825 mg/m2 twice daily  with radiation --> July 2024 MRI pelvis w wo contrast showed persistent disease. Disease is not resectable per colorectal surgeon --> FOLFIRI Panitumumab  --> Dec 2025 partial response. --> per pt's preference switched to Xeloda  + Panitumumab . [Pt declined 5-FU pump] Labs are reviewed and discussed with patient.  Proceed with next cycle Xeloda  850 mg/m2 twice daily on days 1 to 14 of a 21-day cycle plus Panitumumab  Q3 weeks. [Panel study regimen].  He tolerates regimen well.

## 2024-05-14 LAB — CEA: CEA: 2.3 ng/mL (ref 0.0–4.7)

## 2024-05-23 ENCOUNTER — Other Ambulatory Visit: Payer: Self-pay

## 2024-05-24 ENCOUNTER — Other Ambulatory Visit: Payer: Self-pay

## 2024-05-27 ENCOUNTER — Encounter: Payer: Self-pay | Admitting: Oncology

## 2024-05-31 ENCOUNTER — Encounter: Payer: Self-pay | Admitting: Oncology

## 2024-05-31 ENCOUNTER — Other Ambulatory Visit: Payer: Self-pay | Admitting: Oncology

## 2024-05-31 DIAGNOSIS — C20 Malignant neoplasm of rectum: Secondary | ICD-10-CM

## 2024-06-03 ENCOUNTER — Ambulatory Visit: Admitting: Oncology

## 2024-06-03 ENCOUNTER — Other Ambulatory Visit

## 2024-06-03 ENCOUNTER — Ambulatory Visit

## 2024-06-10 ENCOUNTER — Inpatient Hospital Stay (HOSPITAL_BASED_OUTPATIENT_CLINIC_OR_DEPARTMENT_OTHER): Payer: Self-pay | Admitting: Oncology

## 2024-06-10 ENCOUNTER — Encounter: Payer: Self-pay | Admitting: Oncology

## 2024-06-10 ENCOUNTER — Inpatient Hospital Stay: Payer: Self-pay | Attending: Oncology

## 2024-06-10 ENCOUNTER — Inpatient Hospital Stay: Payer: Self-pay

## 2024-06-10 VITALS — BP 138/82 | HR 91 | Temp 98.5°F | Resp 20 | Wt 190.7 lb

## 2024-06-10 VITALS — BP 167/95 | HR 78 | Temp 96.0°F | Resp 18

## 2024-06-10 DIAGNOSIS — C20 Malignant neoplasm of rectum: Secondary | ICD-10-CM

## 2024-06-10 DIAGNOSIS — E876 Hypokalemia: Secondary | ICD-10-CM

## 2024-06-10 DIAGNOSIS — E559 Vitamin D deficiency, unspecified: Secondary | ICD-10-CM

## 2024-06-10 DIAGNOSIS — I10 Essential (primary) hypertension: Secondary | ICD-10-CM

## 2024-06-10 DIAGNOSIS — Z5112 Encounter for antineoplastic immunotherapy: Secondary | ICD-10-CM | POA: Diagnosis present

## 2024-06-10 DIAGNOSIS — Z5111 Encounter for antineoplastic chemotherapy: Secondary | ICD-10-CM

## 2024-06-10 DIAGNOSIS — Z86711 Personal history of pulmonary embolism: Secondary | ICD-10-CM

## 2024-06-10 LAB — CBC WITH DIFFERENTIAL (CANCER CENTER ONLY)
Abs Immature Granulocytes: 0.02 K/uL (ref 0.00–0.07)
Basophils Absolute: 0 K/uL (ref 0.0–0.1)
Basophils Relative: 0 %
Eosinophils Absolute: 0.1 K/uL (ref 0.0–0.5)
Eosinophils Relative: 3 %
HCT: 41.2 % (ref 39.0–52.0)
Hemoglobin: 14.9 g/dL (ref 13.0–17.0)
Immature Granulocytes: 0 %
Lymphocytes Relative: 26 %
Lymphs Abs: 1.3 K/uL (ref 0.7–4.0)
MCH: 32.4 pg (ref 26.0–34.0)
MCHC: 36.2 g/dL — ABNORMAL HIGH (ref 30.0–36.0)
MCV: 89.6 fL (ref 80.0–100.0)
Monocytes Absolute: 0.7 K/uL (ref 0.1–1.0)
Monocytes Relative: 14 %
Neutro Abs: 2.9 K/uL (ref 1.7–7.7)
Neutrophils Relative %: 57 %
Platelet Count: 177 K/uL (ref 150–400)
RBC: 4.6 MIL/uL (ref 4.22–5.81)
RDW: 13 % (ref 11.5–15.5)
WBC Count: 5.1 K/uL (ref 4.0–10.5)
nRBC: 0 % (ref 0.0–0.2)

## 2024-06-10 LAB — CMP (CANCER CENTER ONLY)
ALT: 37 U/L (ref 0–44)
AST: 35 U/L (ref 15–41)
Albumin: 3.5 g/dL (ref 3.5–5.0)
Alkaline Phosphatase: 89 U/L (ref 38–126)
Anion gap: 9 (ref 5–15)
BUN: 7 mg/dL (ref 6–20)
CO2: 22 mmol/L (ref 22–32)
Calcium: 8 mg/dL — ABNORMAL LOW (ref 8.9–10.3)
Chloride: 106 mmol/L (ref 98–111)
Creatinine: 0.84 mg/dL (ref 0.61–1.24)
GFR, Estimated: >60 mL/min (ref >60)
Glucose, Bld: 183 mg/dL — ABNORMAL HIGH (ref 70–99)
Potassium: 3.4 mmol/L — ABNORMAL LOW (ref 3.5–5.1)
Sodium: 137 mmol/L (ref 135–145)
Total Bilirubin: 0.7 mg/dL (ref 0.0–1.2)
Total Protein: 6.7 g/dL (ref 6.5–8.1)

## 2024-06-10 LAB — MAGNESIUM: Magnesium: 1.4 mg/dL — ABNORMAL LOW (ref 1.7–2.4)

## 2024-06-10 MED ORDER — OYSTER SHELL CALCIUM/D3 500-5 MG-MCG PO TABS: 3.0000 | ORAL_TABLET | Freq: Every day | ORAL | Status: AC

## 2024-06-10 MED ORDER — SODIUM CHLORIDE 0.9 % IV SOLN
9.0000 mg/kg | Freq: Once | INTRAVENOUS | Status: AC
  Administered 2024-06-10: 700 mg via INTRAVENOUS
  Filled 2024-06-10: qty 15

## 2024-06-10 MED ORDER — SODIUM CHLORIDE 0.9 % IV SOLN
INTRAVENOUS | Status: DC
  Filled 2024-06-10: qty 250

## 2024-06-10 NOTE — Assessment & Plan Note (Signed)
 Continue  losartan  25mg  daily. Recommend patient to monitor BP at home.  Follow up with PCP

## 2024-06-10 NOTE — Assessment & Plan Note (Signed)
 Recommend patient to take slow Mag 2 tab daily.  He declined IV Mag 2g x 1

## 2024-06-10 NOTE — Assessment & Plan Note (Signed)
 Recommend patient to take vitamin D  2000 units daily

## 2024-06-10 NOTE — Assessment & Plan Note (Signed)
 Continue  potassium to 40meq in AM and 20meq in PM.

## 2024-06-10 NOTE — Assessment & Plan Note (Signed)
 December 2024 right lower lobe segmental and lobar pulmonary embolism On  Xarelto  to 10mg  daily. Patient reports being compliant.

## 2024-06-10 NOTE — Patient Instructions (Signed)

## 2024-06-10 NOTE — Progress Notes (Signed)
 Patient notified his calcium  and magnesium  were low. Pt declined getting iv repletion today of calcium  and magnesium  due to having to pick his granddaughter up. MD notified and instructed pt to take three calcium  tablets daily instead of two as he has been doing. MD instructed pt to also continue taking magnesium  and start otc vitamin D . Pt verified understanding. Pharmacy and MD notified pt dose at 10% limit per weight today. Pharmacist and MD verified ok to proceed with dose today and adjust if pt gains anymore weight in the future. Pt received vectibix  and was stable at discharge.

## 2024-06-10 NOTE — Progress Notes (Signed)
 Hematology/Oncology Progress note [phone] [phone]      CHIEF COMPLAINTS/REASON FOR VISIT:  Follow up for recurrent rectal cancer treatment  ASSESSMENT & PLAN:   Cancer Staging  Rectal cancer [facility]) Staging form: Colon and Rectum, AJCC 8th Edition - Clinical stage from 02/08/2021: Stage IIIB (cT4a, cN1, cM0) - Signed by Babara Call, MD on 03/06/2021   Rectal cancer [facility]) locally advanced rectal cancer-Stage IIIB, s/p TNT neoadjuvant protocol, concurrent Xeloda  and radiation-Finished 08/27/2021, s/p  APR,rectal adenocarcinoma, ypT3N0. Positive radial margin due to perforation. Biopsy proven recurrent rectal cancer, locally advanced disease, possible colovesical fistula; no distant metastatic disease on PET- per [name] Surgeon Dr. Florie, recurrence is not resectable. --> June 2024 S/p concurrent Xeloda  825 mg/m2 twice daily  with radiation --> July 2024 MRI pelvis w wo contrast showed persistent disease. Disease is not resectable per colorectal surgeon --> FOLFIRI Panitumumab  --> Dec 2025 partial response. --> per pt's preference switched to Xeloda  + Panitumumab . [Pt declined 5-FU pump] Labs are reviewed and discussed with patient.  Proceed with next cycle Xeloda  850 mg/m2 twice daily on days 1 to 14 of a 21-day cycle plus Panitumumab  Q3 weeks. [Panel study regimen].  He tolerates regimen well.    Encounter for antineoplastic chemotherapy Chemotherapy plan as listed  History of pulmonary embolism December 2024 right lower lobe segmental and lobar pulmonary embolism On  Xarelto  to 10mg  daily. Patient reports being compliant.    Hypertension Continue  losartan  25mg  daily. Recommend patient to monitor BP at home.  Follow up with PCP  Hypocalcemia He declines IV calcium .  Recommend to increase calcium  to 1500mg  daily.    Hypokalemia Continue  potassium to 40meq in AM and 20meq in PM.    Hypomagnesemia Recommend patient to take slow Mag 2 tab daily.  He  declined IV Mag 2g x 1   Vitamin D  deficiency Recommend patient to take vitamin D  2000 units daily    Patient declined influenza vaccination.     Orders Placed This Encounter  Procedures   VITAMIN D  25 Hydroxy (Vit-D Deficiency, Fractures)    Standing Status:   Future    Expected Date:   07/01/2024    Expiration Date:   07/01/2025    Follow-up  3 weeks All questions were answered. The patient knows to call the clinic with any problems, questions or concerns.  Call Babara, MD, PhD [facility] Hematology Oncology 06/10/2024     HISTORY OF PRESENTING ILLNESS:   Barry Yaffe. is a  58 y.o.  male presents for rectal cancer Oncology History  Rectal cancer (HCC)  02/08/2021 Initial Diagnosis   Rectal cancer   02/05/2021-02/06/2021 patient was hospitalized due to generalized weakness, intermittent lightheadedness, weight loss and worsening constipation.  02/05/2021 CT abdomen showed concerning of severe rectal wall thickening and right internal iliac lymph node concerning for metastatic disease.  Patient was seen by gastroenterology and had colonoscopy which showed a circumferential fungating mass in the rectum.  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   02/14/2021-02/16/2021 hospitalized due to rectal bleeding and pain.   02/14/2021 CT showed perirectal fluid collection/gas concerning for infection with significant leukocytosis, anemia with hemoglobin of 6.8.  Patient received PRBC transfusion, IV antibiotics.  He underwent an IR guided placement of JP drain into the rectal abscess.  Discharged home with oral Augmentin . 02/20/2021 presented to ER with new skin opening and draining from left gluteus.  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  JP drain has been removed.  Patient was continued on Augmentin .   02/13/2021, PET scan showed locally advanced rectal cancer with billowing of the mesorectum now with low attenuation material that was not present on  previous examination with extensive stranding and inflammation.   Bulky RIGHT pelvic sidewall/hypogastric lymph node outside of the mesorectum with stippled calcification measuring 19 mm-no increased metabolic activity. Small LEFT hypogastric lymph node just peripheral to the internal external bifurcation-SUV 3.2 High RIGHT internal just below or at the internal/common iliac transition, lymph node -SUV 4.8 LEFT high hypogastric lymph node  8 mm-SUV 3. Scattered lymph nodes throughout the retroperitoneum with low FDG uptake Bilateral inguinal lymph nodes largest on the RIGHT (image 248/3) 11 mm with a maximum SUV of 2.8 spiculated nodule in the LEFT upper lobe- 9 x 8 mm       02/14/2021, CT abdomen pelvis without contrast Showed perforated rectal mass with contained perforation with fluid and gas extending above and below the pelvic floor,potentially involving the sphincter complex and extending into LEFT ischial rectal fossa   02/20/2021, CT pelvis with contrast showed Large perirectal/perianal abscess has markedly decreased in size since placement of the percutaneous drain. There are residual gas-filled collections in the soft tissues and suspect there is a fistula or sinus tract between the rectal mass and the subcutaneous tissues. Soft tissue gas along the medial left buttock and concern for a cutaneous ulceration in this area. Large rectal mass with evidence for a large necrotic right pelvic lymph node.   His case is complicated with a perirectal abscess. Prior to onset of perirectal abscess, on his 02/05/2021 scan, he was noted to have right internal iliac lymph node 3 x 1 x 2.3 cm which is concerning for nodal disease.On Subsequent images it was difficult to distinguish whether lymphadenopathy was due to nodal disease versus acute inflammation. 03/07/2021 MRI pelvis cT3 N2. Due to the possible contained perforation on previous CT, possible cT4 disease.    02/27/2021 medi port placed by  Dr.Dew 03/13/2021 reports right butt cheek and also left perianal area fullness.he was seen by Dr.Pabon urgently  and had right buttock abscess drained. Left perianal fullness was felt to be due to cancer.    02/08/2021 Cancer Staging   Staging form: Colon and Rectum, AJCC 8th Edition - Clinical stage from 02/08/2021: Stage IIIB (cT4a, cN1, cM0) - Signed by Babara Call, MD on 03/06/2021 Stage prefix: Initial diagnosis   02/27/2021 Procedure   medi port placed by Dr.Dew   03/18/2021 - 07/05/2021 Chemotherapy    FOLFOX q14d x 4 months      07/17/2021 - 08/27/2021 Chemotherapy   Xeloda  concurrent with Radiation   11/18/2021 Surgery   patient is status post open APR with flap for rectal cancer. Pathology rectal adenomacarcinoma ypT3N0.  Positive margin: Radial (circumferential) or mesenteric: adjacent to perforation      01/27/2022 - 02/02/2022 Hospital Admission   Hospitalized at [facility] due to recurrent abscess.  US  guided drain into the pelvic abscess with return of 60 ml of purulent fluid. Cultures positive for staph aureus and strep agalactiae group b, fungal cultures negative.    02/25/2022 Imaging   CT chest angiogram with and without contrast, CT abdomen pelvis with and without contrast 1. Negative for acute pulmonary embolus.2. Emphysema. Further decrease in size of the previously noted irregular nodule in the left upper lobe which is now barely measurable today. No new suspicious lung nodules 3. Status post left lower quadrant colostomy. Further decrease in size since comparison exam from May  of the rim enhancing gas and fluid collection at the pelvic surgical bed extending from the perineum superiorly into the pelvis 4. Slightly thickened appearance of terminal ileal small bowel loops in the pelvis with mild stranding suggesting small bowel inflammatory process. 5. Stable enlarged right pelvic sidewall lymph node   06/02/2022 Imaging   CT chest abdomen pelvis w contrast 1. Status post  abdominal perineal resection with descending colostomy. 2. At the site of pelvic fluid and gas collection on 02/25/2022, there is residual, decreased presacral soft tissue fullness, without drainable collection. 3. Similar right obturator nodal metastasis.4.  No acute process or evidence of metastatic disease in the chest. 5. Age advanced coronary artery atherosclerosis. Recommend assessment of coronary risk factors. 6. Aortic atherosclerosis and emphysema     06/26/2022 Imaging   CT abdomen pelvis with contrast showed 1. Interval increase in size of a lobulated partially imaged at least 7.8 x 3.8 x 12 cm abscess in the perianal region in a patient status post abdominal perineal resection and left lower lobe end colostomy formation. Finding extends to involve the pre sacral region up to the urinary dome level. Associated posterior urinary bladder wall thickening with lack of intraperitoneal fat plane between the presacral soft tissue thickening/abscess formation suggestive of possible fistulization and invasion of the posterior bladder wall. Underlying recurrent malignancy is not excluded. 2. Stable 2.7 cm right pelvic sidewall lymph node. 3.  Aortic Atherosclerosis   06/26/2022 - 06/27/2022 Hospital Admission   He went to [facility] ER and CT showed recurrent abscess. He was transferred to [facility] due to recurrent abscess, treated with IV vanc/zosyn , IR was consulted and drainage tube was placed. Wound grew up Strep viridans. Patient was seen by ID at [facility] discharged with Augmentin  for 2 weeks.   He was seen by wound care Dr> Georgie on 07/09/2022, JP drain was removed.    09/29/2022 Imaging   MRI pelvis without contrast showed  Interval abdominoperitoneal resection since prior MRI. Decreased small fluid collection in the surgical bed compared with more recent CT, consistent with resolving postoperative fluid collection or abscess.   Bulky rounded presacral mass, which shows diffuse restricted  diffusion, without significant change in size since most recent CT of 06/26/2022. This raises suspicion for recurrent carcinoma over post treatment changes. Recommend correlation with CEA level, and consider tissue sampling or PET-CT.   Stable enlarged right pelvic sidewall lymph node. No new or increased adenopathy identified.   10/09/2022 Imaging   CT chest with contrast showed 1. Unchanged 0.4 cm fissural nodule of the anterior left lower lobe. This is almost certainly a benign fissural lymph node. No new or suspicious pulmonary nodules. 2. Moderate emphysema and diffuse bilateral bronchial wall thickening. 3. Coronary artery disease.   10/13/2022 Imaging   CT abdomen pelvis with contrast showed 1. Complex collection persists in the lower pelvis, extending from the anal verge upwards into the presacral space and anteriorly from the presacral space to the bladder dome, not significantly changed in size or extent compared to the earlier CT of 06/26/2022. This is most likely a combination of a postoperative seroma and/or chronic phlegmon/abscess versus recurrent abscess. 2. Bladder walls are thick walled/edematous. Given the contiguity of the presacral collection and the bladder dome, this is highly suspicious for a related bladder wall infection and possibly secondary to colovesical fistula. Recommend correlation with urinalysis. 3. No evidence of bowel obstruction. LEFT lower abdominal wall colostomy, without obstruction or inflammatory change. 4. No free intraperitoneal air   10/13/2022 -  Hospital Admission   Hospitalized due to fever and rectal pain. Urine culture showed mixed urogenital flora. IR CT guided Aspiration of abscess showed Streptococcal Viridans. He was placed back on Augmentin   10/14/2023 CT read by [facility] radiology Postsurgical changes of abdominoperineal resection. Interval enlargement of  fluid collection in the surgical bed. Multiple foci of enhancing tissue at  the margins of this  fluid collection, increased from prior study, suspicious for local recurrence.   Prominent retroperitoneal lymph nodes, some which are increased in size from prior study. These are indeterminate and may be either reactive or represent metastatic disease. Recommend continued attention on follow-up.   Centrally hypoattenuating right obturator lymph node, unchanged in size, consistent with treated disease.   Circumferential bladder wall thickening and irregularity, most pronounced on the left side. This may be reactive in the setting of adjacent pelvic inflammatory changes. Correlate with urinalysis if there is clinical  concern for urinary tract infection.    11/14/2022 Imaging   MRI pelvis w wo contrast  1. Status post abdominoperineal resection with left lower quadrant end colostomy. 2. Compared to prior CT and MR, no significant change in appearance of heterogeneous, rim enhancing presacral and low pelvic soft tissue and fluid. Largest heterogeneously enhancing conglomerate of fluid and soft tissue appears to closely involve the anteriorly abutting the seminal vesicles and measures 3.0 x 2.9 cm in largest axial dimension. This extends to superiorly contact the posterior bladder dome and inferiorly towards the gluteal cleft. 3. Discrete fluid component at the most inferior extent measuring 2.4 x 1.3 cm. This is markedly diminished in volume compared to more remote previous examinations, for example 06/26/2022. 4. Constellation of findings is highly concerning for locally recurrent rectal malignancy with or without superimposed infection. Presence or absence of infection at this time is not established by MR. 5. Additional unchanged hemorrhagic or proteinaceous fluid collection in the right hemipelvis measuring 3.2 x 2.6 cm most consistent with postoperative hematoma or seroma. 6. Unchanged thickening of the urinary bladder wall. As previously reported, fistula or involvement of bladder wall by  malignancy not excluded.   12/09/2022 Procedure   CT guided biopsy of the pelvic mass showed Moderately differentiated adenocarcinoma with dirty necrosis, morphologically identical to patient's prior rectal adenocarcinoma.   [facility] NGS xT 648 panel showed MLH3 start loss,  BCORL1 frame shift, TP53 splice region varian, APC stop gain,  TMB 7.9, MS stable, KRAS/BRAF/NRAS negative.   [facility] NGS xR showed no gene arrangement or reportable altered splicing events in RNA sequencing      01/07/2023 -  Chemotherapy   Xeloda  825 mg/m2 twice daily  with radiation   02/11/2023 Imaging   CT abdomen pelvis w contrast  1. Examination is positive for small bowel obstruction. Transition point is identified within the left iliac fossa. Distal to the transition point the small bowel loops exhibit mucosal enhancement and wall thickening concerning for enteritis. 2. There is new right-sided hydronephrosis and hydroureter up to the level of the bifurcation of right common iliac artery. The distal right ureter appears closely associated with the previously characterized tracer avid presacral soft tissue mass. Cannot exclude obstructive uropathy secondary to tumor involvement. 3. Similar appearance of presacral soft tissue mass. This was tracer avid on the recent PET-CT from 12/17/2022, and concerning for locally recurrent tumor. 4. Unchanged diffuse circumferential wall thickening involving the urinary bladder. 5. Aortic Atherosclerosis    04/17/2023 - 07/29/2023 Chemotherapy   Patient is on Treatment Plan : COLORECTAL FOLFIRI + Panitumumab  q14d  08/14/2023 Imaging   CT chest abdomen pelvis w contrast showed  Heterogeneous central pelvic mass in the area of the rectum overall similar configuration to the previous CT scan. Again the lesion abuts the bladder in the area of the seminal vesicles. Extension up along the presacral space. Surgical changes from diverting colostomy.   Essentially stable right pelvic  sidewall soft tissue mass or nodal enlargement. No new nodal enlargement elsewhere in the chest, abdomen or pelvis.   Fatty liver infiltration.   Subtle nodular opacity along the right lung base. Infiltrates possible. Please correlate with symptoms and recommend follow-up.   Incidental right lower lobe segmental and lobar pulmonary embolism.     09/23/2023 -  Chemotherapy   Patient is on Treatment Plan : COLORECTAL Xeloda  + Panitumumab  q21d     12/10/2023 Imaging   CT chest abdomen pelvis w contrast showed 1. Similar appearance of soft tissue mass within the posterior pelvis in the region of the rectum. This is stable to mildly decreased in the interval. As noted previously tumor extends along the presacral soft tissue space and abuts the posterior dome of bladder and posterior bladder base. 2. Stable appearance of soft tissue mass/enlarged lymph node within the right posterior pelvic sidewall. 3. No new or progressive disease identified. 4.  Aortic Atherosclerosis (ICD10-I70.0).       INTERVAL HISTORY Barry [name]. is a 58 y.o. male who has above history reviewed by me today presents for follow up visit for management of recurrent locally advanced rectal cancer. Denies fever chills, rectal pain. Denies rectal discharge.   Intermittent diarrhea  Denies nausea vomiting or abdominal pain.  No leg swelling. He takes Xarelto  10mg  daily He has gained weight.       Review of Systems  Constitutional:  Positive for fatigue. Negative for appetite change, chills, fever and unexpected weight change.  HENT:   Negative for hearing loss and voice change.   Eyes:  Negative for eye problems and icterus.  Respiratory:  Negative for chest tightness, cough, hemoptysis and shortness of breath.   Cardiovascular:  Negative for chest pain and leg swelling.  Gastrointestinal:  Negative for abdominal distention, abdominal pain, blood in stool, constipation, diarrhea and rectal pain.   Endocrine: Negative for hot flashes.  Genitourinary:  Negative for difficulty urinating, dysuria and frequency.   Musculoskeletal:  Negative for arthralgias.  Skin:  Negative for itching and rash.  Neurological:  Negative for light-headedness and numbness.  Hematological:  Negative for adenopathy. Does not bruise/bleed easily.  Psychiatric/Behavioral:  Negative for confusion.     MEDICAL HISTORY:  Past Medical History:  Diagnosis Date   Headache    Left shoulder pain    Rectal cancer (HCC)     SURGICAL HISTORY: Past Surgical History:  Procedure Laterality Date   COLON SURGERY     COLONOSCOPY WITH PROPOFOL  N/A 02/06/2021   Procedure: COLONOSCOPY WITH PROPOFOL ;  Surgeon: Unk Corinn Skiff, MD;  Location: [facility] ENDOSCOPY;  Service: Gastroenterology;  Laterality: N/A;   PORTA CATH INSERTION N/A 02/27/2021   Procedure: PORTA CATH INSERTION;  Surgeon: Marea Selinda RAMAN, MD;  Location: [facility] INVASIVE CV LAB;  Service: Cardiovascular;  Laterality: N/A;   ROTATOR CUFF REPAIR Left     SOCIAL HISTORY: Social History   Socioeconomic History   Marital status: Married    Spouse name: Not on file   Number of children: Not on file   Years of education: Not on file   Highest education level: Not on file  Occupational History   Not on file  Tobacco Use   Smoking status: Former    Current packs/day: 0.00    Average packs/day: 1 pack/day for 10.0 years (10.0 ttl pk-yrs)    Types: Cigars, Cigarettes    Start date: 02/05/2011    Quit date: 02/04/2021    Years since quitting: 3.3   Smokeless tobacco: Never  Substance and Sexual Activity   Alcohol use: Not Currently   Drug use: No   Sexual activity: Not on file  Other Topics Concern   Not on file  Social History Narrative   Not on file   Social Drivers of Health   Financial Resource Strain: High Risk (02/13/2023)   Received from [facility]   Overall Financial Resource Strain (CARDIA)    Difficulty of Paying Living  Expenses: Hard  Food Insecurity: No Food Insecurity (02/13/2023)   Received from [facility]   Hunger Vital Sign    Within the past 12 months, you worried that your food would run out before you got the money to buy more.: Never true    Within the past 12 months, the food you bought just didn't last and you didn't have money to get more.: Never true  Transportation Needs: No Transportation Needs (02/13/2023)   Received from [facility] - Transportation    In the past 12 months, has lack of transportation kept you from medical appointments or from getting medications?: No    Lack of Transportation (Non-Medical): No  Physical Activity: Inactive (11/03/2022)   Exercise Vital Sign    Days of Exercise per Week: 0 days    Minutes of Exercise per Session: 0 min  Stress: Stress Concern Present (11/03/2022)   [employer] of Occupational Health - Occupational Stress Questionnaire    Feeling of Stress : Rather much  Social Connections: Moderately Isolated (11/03/2022)   Social Connection and Isolation Panel    Frequency of Communication with Friends and Family: Twice a week    Frequency of Social Gatherings with Friends and Family: Three times a week    Attends Religious Services: 1 to 4 times per year    Active Member of Clubs or Organizations: No    Attends Banker Meetings: Never    Marital Status: Separated  Intimate Partner Violence: Not At Risk (11/03/2022)   Humiliation, Afraid, Rape, and Kick questionnaire    Fear of Current or Ex-Partner: No    Emotionally Abused: No    Physically Abused: No    Sexually Abused: No    FAMILY HISTORY: Family History  Problem Relation Age of Onset   Cancer Sister    Diabetes Mother    Cancer Maternal Grandmother    Cancer Paternal Grandmother     ALLERGIES:  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  MEDICATIONS:  Current Outpatient Medications  Medication Sig Dispense Refill   capecitabine   (XELODA ) 150 MG tablet TAKE 1 TABLET BY MOUTH 2 TIMES A DAY AFTER A MEAL FOR 14 DAYS ON, THEN 7 DAYS OFF, REPEAT EVERY 21 DAYS. (TOTAL DOSE OF 1,650 MG 2 TIMES A DAY) 28 tablet 1   capecitabine  (XELODA ) 500 MG tablet TAKE 3 TABLETS BY MOUTH 2 TIMES A DAY AFTER A MEAL FOR 14 DAYS ON, THEN 7 DAYS OFF, REPEAT EVERY 21 DAYS. (TOTAL DOSE OF 1,650 MG 2 TIMES A DAY) 84 tablet 1   Cholecalciferol  (VITAMIN D ) 50 MCG (2000 UT) CAPS Take 1 capsule (2,000 Units  total) by mouth daily.     citalopram  (CELEXA ) 10 MG tablet Take 1 tablet (10 mg total) by mouth daily. 30 tablet 3   clindamycin  (CLINDAGEL ) 1 % gel Apply topically 2 (two) times daily. 30 g 4   lidocaine -prilocaine  (EMLA ) cream Apply 1 Application topically as needed. Apply small amount to port and cover with saran wrap 1-2 hours prior to port access 30 g 2   loperamide  (IMODIUM ) 2 MG capsule Take 1 capsule (2 mg total) by mouth See admin instructions. 90 capsule 2   losartan  (COZAAR ) 25 MG tablet Take 1 tablet (25 mg total) by mouth daily. 90 tablet 1   magnesium  chloride (SLOW-MAG) 64 MG TBEC SR tablet Take 2 tablets (128 mg total) by mouth daily. 60 tablet 2   ondansetron  (ZOFRAN ) 8 MG tablet Take 1 tablet (8 mg total) by mouth every 8 (eight) hours as needed for nausea or vomiting. 90 tablet 1   potassium chloride  SA (KLOR-CON  M) 20 MEQ tablet Take 2 tablets (40 mEq total) by mouth in the morning and take 1 tablet ( ) in the evening. 60 tablet 2   prochlorperazine  (COMPAZINE ) 10 MG tablet Take 1 tablet (10 mg total) by mouth every 6 (six) hours as needed for nausea or vomiting. 90 tablet 1   rivaroxaban  (XARELTO ) 10 MG TABS tablet Take 1 tablet (10 mg total) by mouth daily. 30 tablet 5   senna (SENOKOT) 8.6 MG TABS tablet Take 2 tablets (17.2 mg total) by mouth daily. 100 tablet 0   traZODone  (DESYREL ) 50 MG tablet Take 1 tablet (50 mg total) by mouth at bedtime as needed for sleep. 30 tablet 3   calcium -vitamin D  (OSCAL WITH D) 500-5 MG-MCG  tablet Take 3 tablets by mouth daily.     No current facility-administered medications for this visit.   Facility-Administered Medications Ordered in Other Visits  Medication Dose Route Frequency Provider Last Rate Last Admin   0.9 %  sodium chloride  infusion   Intravenous Continuous Babara Call, MD   Stopped at 06/10/24 1109   heparin  lock flush 100 unit/mL  500 Units Intravenous Once Babara Call, MD         PHYSICAL EXAMINATION: ECOG PERFORMANCE STATUS: 1 - Symptomatic but completely ambulatory Vitals:   06/10/24 0850  BP: 138/82  Pulse: 91  Resp: 20  Temp: 98.5 F (36.9 C)  SpO2: 100%     Filed Weights   06/10/24 0850  Weight: 190 lb 11.2 oz (86.5 kg)       Physical Exam HENT:     Head: Normocephalic and atraumatic.  Eyes:     General: No scleral icterus. Cardiovascular:     Rate and Rhythm: Normal rate and regular rhythm.     Heart sounds: Normal heart sounds.  Pulmonary:     Effort: Pulmonary effort is normal. No respiratory distress.     Breath sounds: No wheezing.     Comments: Decreased breath sound bilaterally.  Abdominal:     General: Bowel sounds are normal. There is no distension.     Palpations: Abdomen is soft.     Comments: Colostomy bag  Genitourinary:    Comments: History of APR, Musculoskeletal:        General: No deformity. Normal range of motion.     Cervical back: Normal range of motion and neck supple.  Skin:    General: Skin is warm and dry.     Findings: No erythema.  Neurological:     Mental Status:  He is alert and oriented to person, place, and time. Mental status is at baseline.     Cranial Nerves: No cranial nerve deficit.  Psychiatric:        Mood and Affect: Mood normal.     LABORATORY DATA:  I have reviewed the data as listed    Latest Ref Rng & Units 06/10/2024    8:36 AM 05/13/2024    8:36 AM 04/22/2024    7:57 AM  CBC  WBC 4.0 - 10.5 K/uL 5.1  4.3  4.2   Hemoglobin 13.0 - 17.0 g/dL 85.0  83.8  84.5   Hematocrit 39.0 -  52.0 % 41.2  45.8  43.7   Platelets 150 - 400 K/uL 177  161  183       Latest Ref Rng & Units 06/10/2024    8:38 AM 05/13/2024    8:36 AM 04/22/2024    7:57 AM  CMP  Glucose 70 - 99 mg/dL 816  857  875   BUN 6 - 20 mg/dL 7  <5  7   Creatinine 9.38 - 1.24 mg/dL 9.15  9.17  9.27   Sodium 135 - 145 mmol/L 137  137  138   Potassium 3.5 - 5.1 mmol/L 3.4  3.6  3.3   Chloride 98 - 111 mmol/L 106  106  107   CO2 22 - 32 mmol/L 22  22  23    Calcium  8.9 - 10.3 mg/dL 8.0  8.7  8.5   Total Protein 6.5 - 8.1 g/dL 6.7  6.8  6.7   Total Bilirubin 0.0 - 1.2 mg/dL 0.7  0.5  0.6   Alkaline Phos 38 - 126 U/L 89  87  89   AST 15 - 41 U/L 35  26  19   ALT 0 - 44 U/L 37  22  18      RADIOGRAPHIC STUDIES: I have personally reviewed the radiological images as listed and agreed with the findings in the report. CT ABDOMEN PELVIS W CONTRAST Result Date: 04/10/2024 CLINICAL DATA:  Rectal cancer restaging. EXAM: CT ABDOMEN AND PELVIS WITH CONTRAST TECHNIQUE: Multidetector CT imaging of the abdomen and pelvis was performed using the standard protocol following bolus administration of intravenous contrast. RADIATION DOSE REDUCTION: This exam was performed according to the departmental dose-optimization program which includes automated exposure control, adjustment of the mA and/or kV according to patient size and/or use of iterative reconstruction technique. CONTRAST:  OMNIPAQUE  IOHEXOL  350 MG/ML SOLN COMPARISON:  12/10/2023, 03/02/2023 FINDINGS: Lower chest: Heart is normal size.  Lung bases are clear. Hepatobiliary: Stable 1.1 cm hypodensity adjacent the border of the inferior right lobe of the liver. Gallbladder and biliary tree are normal. Pancreas: Normal. Spleen: Normal. Adrenals/Urinary Tract: Adrenal glands are normal. Kidneys are normal size without hydronephrosis or nephrolithiasis. Subcentimeter cyst lower pole right kidney unchanged. Ureters are normal. Bladder unchanged. Stomach/Bowel: Stomach and small  bowel are normal. Appendix is normal. Colostomy over the lower abdomen just left of midline. Evidence of prior APR with surgical clips over the right perineum. No significant change in residual soft tissue density over the rectum measuring approximately 4.9 x 4.2 cm in AP and transverse dimension (previously 4.9 x 4.7 cm). This soft tissue density extends superiorly where it abuts the posterosuperior border of the bladder as this is unchanged. Soft tissue mass/lymph node over the right pelvic sidewall measuring 2.5 x 3.2 cm without significant change. Vascular/Lymphatic: Minimal calcified plaque over the abdominal aorta which is normal in  caliber. Remaining vascular structures are unremarkable. Adenopathy over the abdomen. Reproductive: Prostate and seminal vesicles unchanged and not well-defined. Other: No significant free fluid. Musculoskeletal: No focal abnormality to suggest metastatic disease. IMPRESSION: 1. No acute findings in the abdomen/pelvis. 2. Stable postsurgical changes over the pelvis with evidence of prior APR with colostomy in this patient with history of rectal cancer. Stable residual soft tissue density over the rectum measuring 4.9 x 4.2 cm. Stable soft tissue mass/lymph node over the right pelvic sidewall measuring 2.5 x 3.2 cm. 3. Stable 1.1 cm hypodensity adjacent the border of the inferior right lobe of the liver likely benign. 4. Aortic atherosclerosis. Aortic Atherosclerosis (ICD10-I70.0). Electronically Signed   By: Toribio Agreste M.D.   On: 04/10/2024 15:52   CT Angio Chest Pulmonary Embolism (PE) W or WO Contrast Result Date: 04/05/2024 CLINICAL DATA:  Hemoptysis.  History of rectal cancer. EXAM: CT ANGIOGRAPHY CHEST WITH CONTRAST TECHNIQUE: Multidetector CT imaging of the chest was performed using the standard protocol during bolus administration of intravenous contrast. Multiplanar CT image reconstructions and MIPs were obtained to evaluate the vascular anatomy. RADIATION DOSE  REDUCTION: This exam was performed according to the departmental dose-optimization program which includes automated exposure control, adjustment of the mA and/or kV according to patient size and/or use of iterative reconstruction technique. CONTRAST:  OMNIPAQUE  IOHEXOL  350 MG/ML SOLN COMPARISON:  December 10, 2023. FINDINGS: Cardiovascular: Satisfactory opacification of the pulmonary arteries to the segmental level. No evidence of pulmonary embolism. 4 cm ascending thoracic aortic aneurysm. Normal heart size. No pericardial effusion. Mediastinum/Nodes: No enlarged mediastinal, hilar, or axillary lymph nodes. Thyroid gland, trachea, and esophagus demonstrate no significant findings. Lungs/Pleura: No pneumothorax or pleural effusion is noted. Stable small perifissural nodule seen along left oblique fissure laterally most consistent with benign intrapulmonary lymph node. Probable mild emphysematous disease. No acute abnormality seen. Upper Abdomen: No acute abnormality. Musculoskeletal: No chest wall abnormality. No acute or significant osseous findings. Review of the MIP images confirms the above findings. IMPRESSION: No definite evidence of pulmonary embolus. 4 cm ascending thoracic aortic aneurysm. Recommend annual imaging followup by CTA or MRA. This recommendation follows 2010 ACCF/AHA/AATS/ACR/ASA/SCA/SCAI/SIR/STS/SVM Guidelines for the Diagnosis and Management of Patients with Thoracic Aortic Disease. Circulation. 2010; 121: Z733-z630. Aortic aneurysm NOS (ICD10-I71.9). Aortic Atherosclerosis (ICD10-I70.0) and Emphysema (ICD10-J43.9). Electronically Signed   By: Lynwood Landy Raddle M.D.   On: 04/05/2024 12:21

## 2024-06-10 NOTE — Assessment & Plan Note (Addendum)
 He declines IV calcium .  Recommend to increase calcium  to 1500mg  daily.

## 2024-06-10 NOTE — Assessment & Plan Note (Signed)
 Chemotherapy plan as listed

## 2024-06-10 NOTE — Assessment & Plan Note (Signed)
 locally advanced rectal cancer-Stage IIIB, s/p TNT neoadjuvant protocol, concurrent Xeloda  and radiation-Finished 08/27/2021, s/p  APR,rectal adenocarcinoma, [id number]. Positive radial margin due to perforation. Biopsy proven recurrent rectal cancer, locally advanced disease, possible colovesical fistula; no distant metastatic disease on PET- per [name] Surgeon Dr. Florie, recurrence is not resectable. --> June 2024 S/p concurrent Xeloda  825 mg/m2 twice daily  with radiation --> July 2024 MRI pelvis w wo contrast showed persistent disease. Disease is not resectable per colorectal surgeon --> FOLFIRI Panitumumab  --> Dec 2025 partial response. --> per pt's preference switched to Xeloda  + Panitumumab . [Pt declined 5-FU pump] Labs are reviewed and discussed with patient.  Proceed with next cycle Xeloda  850 mg/m2 twice daily on days 1 to 14 of a 21-day cycle plus Panitumumab  Q3 weeks. [Panel study regimen].  He tolerates regimen well.

## 2024-06-11 LAB — CEA: CEA: 1.9 ng/mL (ref 0.0–4.7)

## 2024-06-24 ENCOUNTER — Ambulatory Visit: Admitting: Oncology

## 2024-06-24 ENCOUNTER — Ambulatory Visit

## 2024-06-24 ENCOUNTER — Other Ambulatory Visit

## 2024-07-01 ENCOUNTER — Inpatient Hospital Stay

## 2024-07-01 ENCOUNTER — Encounter: Payer: Self-pay | Admitting: Oncology

## 2024-07-01 ENCOUNTER — Inpatient Hospital Stay: Admitting: Oncology

## 2024-07-01 ENCOUNTER — Other Ambulatory Visit: Payer: Self-pay | Admitting: Oncology

## 2024-07-01 DIAGNOSIS — C20 Malignant neoplasm of rectum: Secondary | ICD-10-CM

## 2024-07-08 ENCOUNTER — Inpatient Hospital Stay

## 2024-07-08 ENCOUNTER — Inpatient Hospital Stay: Admitting: Oncology

## 2024-07-08 NOTE — Assessment & Plan Note (Deleted)
 locally advanced rectal cancer-Stage IIIB, s/p TNT neoadjuvant protocol, concurrent Xeloda  and radiation-Finished 08/27/2021, s/p  APR,rectal adenocarcinoma, ypT3N0. Positive radial margin due to perforation. Biopsy proven recurrent rectal cancer, locally advanced disease, possible colovesical fistula; no distant metastatic disease on PET- per [name] Surgeon Dr. Florie, recurrence is not resectable. --> June 2024 S/p concurrent Xeloda  825 mg/m2 twice daily  with radiation --> July 2024 MRI pelvis w wo contrast showed persistent disease. Disease is not resectable per colorectal surgeon --> FOLFIRI Panitumumab  --> Dec 2025 partial response. --> per pt's preference switched to Xeloda  + Panitumumab . [Pt declined 5-FU pump] Labs are reviewed and discussed with patient.  Proceed with next cycle Xeloda  850 mg/m2 twice daily on days 1 to 14 of a 21-day cycle plus Panitumumab  Q3 weeks. [Panel study regimen].  He tolerates regimen well.

## 2024-07-26 ENCOUNTER — Telehealth: Payer: Self-pay | Admitting: Oncology

## 2024-07-26 NOTE — Telephone Encounter (Signed)
 Called pt and left vm for pt to call back to r/s missed tx appts. Scheduling phone number provided.

## 2024-10-07 ENCOUNTER — Encounter (HOSPITAL_COMMUNITY): Payer: Self-pay

## 2024-10-07 ENCOUNTER — Ambulatory Visit (INDEPENDENT_AMBULATORY_CARE_PROVIDER_SITE_OTHER)

## 2024-10-07 ENCOUNTER — Ambulatory Visit (HOSPITAL_COMMUNITY)
Admission: EM | Admit: 2024-10-07 | Discharge: 2024-10-07 | Disposition: A | Discharge status: Home / Self Care | Attending: Nurse Practitioner | Admitting: Nurse Practitioner

## 2024-10-07 DIAGNOSIS — W19XXXA Unspecified fall, initial encounter: Secondary | ICD-10-CM

## 2024-10-07 DIAGNOSIS — M25511 Pain in right shoulder: Secondary | ICD-10-CM | POA: Diagnosis not present

## 2024-10-07 MED ORDER — TIZANIDINE HCL 4 MG PO TABS: 4.0000 mg | ORAL_TABLET | Freq: Three times a day (TID) | ORAL | 0 refills | Status: AC | PRN

## 2024-10-07 NOTE — ED Triage Notes (Signed)
 Patient reports that he fell on the ice yesterday. Patient states he landed on his back and then his right shoulder hit. Patient states that he is unable to raise his right arm up.  Patient has not been taking any medications for pain.

## 2024-10-07 NOTE — ED Provider Notes (Signed)
 " [facility]    CSN: [id number] Arrival date & time: 10/07/24  0830      History   Chief Complaint Chief Complaint  Patient presents with   Fall   Shoulder Injury    HPI [name]. is a 59 y.o. male.   Patient presents today for right shoulder pain.  Reports pain began after slipping and falling on the ice yesterday.  Reports he landed on his back and his right shoulder.  He endorses pain with movement of the right arm.  No swelling appreciated.  Has not taken anything for pain so far.  No numbness or tingling going down the arm.      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   Dysuria 01/07/2023   Colostomy care (HCC) 01/07/2023   Perineal abscess 09/22/2022   Hypokalemia 06/09/2022   Former smoker 06/09/2022   Lung nodule 03/05/2022   Port-A-Cath in place 03/05/2022   Encounter for antineoplastic chemotherapy 03/18/2021   Sepsis (HCC)    Colon perforation (HCC)    BRBPR (bright red blood per rectum) 02/14/2021   Rectal cancer (HCC) 02/08/2021   Goals of care, counseling/discussion 02/08/2021   Iron  deficiency anemia due to chronic blood loss 02/08/2021   Proctitis    Constipation 02/05/2021   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/05/2021   Chronic blood loss anemia 02/05/2021   Postural dizziness with presyncope 02/05/2021   Bowel wall thickening 02/05/2021   Hyponatremia 02/05/2021   WEIGHT LOSS 10/25/2010    Past Surgical History:   Procedure Laterality Date   COLON SURGERY     COLONOSCOPY WITH PROPOFOL  N/A 02/06/2021   Procedure: COLONOSCOPY WITH PROPOFOL ;  Surgeon: Unk Corinn Skiff, MD;  Location: [facility] ENDOSCOPY;  Service: Gastroenterology;  Laterality: N/A;   COLOSTOMY     PORTA CATH INSERTION N/A 02/27/2021   Procedure: PORTA CATH INSERTION;  Surgeon: Marea Selinda RAMAN, MD;  Location: [facility] INVASIVE CV LAB;  Service: Cardiovascular;  Laterality: N/A;   ROTATOR CUFF REPAIR Left        Home Medications    Prior to Admission medications  Medication Sig Start Date End Date Taking? Authorizing Provider  tiZANidine  (ZANAFLEX ) 4 MG tablet Take 1 tablet (4 mg total) by mouth every 8 (eight) hours as needed for muscle spasms. Do not take with alcohol or while driving or operating heavy machinery.  May cause drowsiness. 10/07/24  Yes Chandra Harlene LABOR, NP  calcium -vitamin D  (OSCAL WITH D) 500-5 MG-MCG tablet Take 3 tablets by mouth daily. 06/10/24   Babara Call, MD  capecitabine  (XELODA ) 150 MG tablet TAKE 1 TABLET BY MOUTH 2 TIMES A DAY AFTER A MEAL FOR 14 DAYS ON, THEN 7 DAYS OFF, REPEAT EVERY 21 DAYS. (TOTAL DOSE OF 1,650 MG 2 TIMES A DAY) Patient not taking: Reported on 10/07/2024 01/06/24   Babara Call, MD  capecitabine  (XELODA ) 500 MG tablet TAKE 3 TABLETS BY MOUTH 2 TIMES A DAY AFTER A MEAL FOR 14 DAYS ON,  THEN 7 DAYS OFF, REPEAT EVERY 21 DAYS. (TOTAL DOSE OF 1,650 MG 2 TIMES A DAY) Patient not taking: Reported on 10/07/2024 01/06/24   Babara Call, MD  Cholecalciferol  (VITAMIN D ) 50 MCG (2000 UT) CAPS Take 1 capsule (2,000 Units total) by mouth daily. 03/24/24   Babara Call, MD  citalopram  (CELEXA ) 10 MG tablet Take 1 tablet (10 mg total) by mouth daily. Patient not taking: Reported on 10/07/2024 10/27/22   Borders, Fonda SAUNDERS, NP  clindamycin  (CLINDAGEL ) 1 % gel Apply topically 2 (two) times daily. Patient not taking: Reported on 10/07/2024 07/13/23   Babara Call, MD  lidocaine -prilocaine  (EMLA ) cream Apply 1 Application topically as needed.  Apply small amount to port and cover with saran wrap 1-2 hours prior to port access 11/04/23   Babara Call, MD  loperamide  (IMODIUM ) 2 MG capsule Take 1 capsule (2 mg total) by mouth See admin instructions. 08/25/23   Babara Call, MD  losartan  (COZAAR ) 25 MG tablet Take 1 tablet (25 mg total) by mouth daily. Patient not taking: Reported on 10/07/2024 05/13/24   Babara Call, MD  magnesium  chloride (SLOW-MAG) 64 MG TBEC SR tablet Take 2 tablets (128 mg total) by mouth daily. 05/13/24   Babara Call, MD  ondansetron  (ZOFRAN ) 8 MG tablet Take 1 tablet (8 mg total) by mouth every 8 (eight) hours as needed for nausea or vomiting. 08/25/23   Babara Call, MD  potassium chloride  SA (KLOR-CON  M) 20 MEQ tablet Take 2 tablets (40 mEq total) by mouth in the morning and take 1 tablet ( ) in the evening. 04/22/24   Babara Call, MD  prochlorperazine  (COMPAZINE ) 10 MG tablet Take 1 tablet (10 mg total) by mouth every 6 (six) hours as needed for nausea or vomiting. Patient not taking: Reported on 10/07/2024 01/07/23   Babara Call, MD  rivaroxaban  (XARELTO ) 10 MG TABS tablet Take 1 tablet (10 mg total) by mouth daily. Patient not taking: Reported on 10/07/2024 02/11/24   Babara Call, MD  senna (SENOKOT) 8.6 MG TABS tablet Take 2 tablets (17.2 mg total) by mouth daily. 03/16/23   Babara Call, MD  traZODone  (DESYREL ) 50 MG tablet Take 1 tablet (50 mg total) by mouth at bedtime as needed for sleep. Patient not taking: Reported on 10/07/2024 10/27/22   Borders, Fonda SAUNDERS, NP    Family History Family History  Problem Relation Age of Onset   Cancer Sister    Diabetes Mother    Cancer Maternal Grandmother    Cancer Paternal Grandmother     Social History Social History[1]   Allergies   Shellfish allergy   Review of Systems Review of Systems Per HPI  Physical Exam Triage Vital Signs ED Triage Vitals [10/07/24 0950]  Encounter Vitals Group     BP (!) 157/116     Girls Systolic BP Percentile      Girls Diastolic BP Percentile      Boys  Systolic BP Percentile      Boys Diastolic BP Percentile      Pulse Rate 88     Resp 16     Temp 97.9 F (36.6 C)     Temp Source Oral     SpO2 97 %     Weight      Height      Head Circumference      Peak Flow      Pain Score 8     Pain Loc      Pain Education  Exclude from Growth Chart    No data found.  Updated Vital Signs BP (!) 157/116 (BP Location: Left Arm) Comment: Repeated-166/115. Patient states he was on BP meds,but was discontinued because his BP was normal. No BP meds for 2 months now.  Pulse 88   Temp 97.9 F (36.6 C) (Oral)   Resp 16   SpO2 97%   Visual Acuity Right Eye Distance:   Left Eye Distance:   Bilateral Distance:    Right Eye Near:   Left Eye Near:    Bilateral Near:     Physical Exam Vitals and nursing note reviewed.  Constitutional:      General: He is not in acute distress.    Appearance: Normal appearance. He is not toxic-appearing.  Pulmonary:     Effort: Pulmonary effort is normal. No respiratory distress.  Musculoskeletal:     Comments: Inspection: no swelling, bruising, obvious deformity or redness to right shoulder or upper extremity Palpation: tender to palpation right deltoid anteriorly and laterally; no obvious deformities palpated ROM: Unable to assess of right shoulder due to pain Strength: 5/5 bilateral upper extremities Neurovascular: neurovascularly intact in distal bilateral upper extremities  Skin:    General: Skin is warm and dry.     Capillary Refill: Capillary refill takes less than 2 seconds.     Coloration: Skin is not jaundiced or pale.     Findings: No erythema.  Neurological:     Mental Status: He is alert and oriented to person, place, and time.  Psychiatric:        Behavior: Behavior is cooperative.      UC Treatments / Results  Labs (all labs ordered are listed, but only abnormal results are displayed) Labs Reviewed - No data to display  EKG   Radiology DG Shoulder Right Result Date:  10/07/2024 EXAM: 1 VIEW(S) XRAY OF THE RIGHT SHOULDER 10/07/2024 10:20:24 AM COMPARISON: Comparison CT 04/05/2024. CLINICAL HISTORY: Right shoulder pain after a fall yesterday. FINDINGS: BONES AND JOINTS: Glenohumeral joint is normally aligned. No acute fracture. No malalignment. The [facility] joint is unremarkable. SOFT TISSUES: No abnormal calcifications. Visualized lung is unremarkable. LINES AND TUBES: Right-sided internal jugular central venous port catheter in place with tip within the superior vena cava. IMPRESSION: 1. No acute fracture or dislocation. Electronically signed by: Dayne Hassell MD 10/07/2024 10:37 AM EST RP Workstation: [id number]    Procedures Procedures (including critical care time)  Medications Ordered in UC Medications - No data to display  Initial Impression / Assessment and Plan / UC Course  I have reviewed the triage vital signs and the nursing notes.  Pertinent labs & imaging results that were available during my care of the patient were reviewed by me and considered in my medical decision making (see chart for details).   Patient is a very pleasant, well-appearing 59 year old male presenting today for right shoulder pain after a fall on the ice yesterday.  In triage, he is hypertensive, otherwise vital signs are stable.  Patient reports he was recently discontinued off his blood pressure medication due to normal blood pressures.  Suspect possible elevation today due to pain.  Encouraged him to check blood pressure at home and notify primary care with elevated readings.  He is asymptomatic today.  X-ray of right shoulder is negative for acute bony abnormality.  Suspect contusion versus muscle strain.  Start Tylenol  500 to 1000 mg every 6 hours as needed for pain and muscle relaxant as needed.  Also recommended ice, light range  of motion and stretching exercises.  ER and return precautions discussed.  The patient was given the opportunity to ask questions.  All questions  answered to their satisfaction.  The patient is in agreement to this plan.   Final Clinical Impressions(s) / UC Diagnoses   Final diagnoses:  Fall, initial encounter  Pain in joint of right shoulder     Discharge Instructions      The xray of your shoulder does not show any broken bones today.  You may have a deep bruise or strained muscle of your right arm.  You can take Tylenol  [phone] mg every 6 hours as needed for pain as well as the muscle relaxant as needed.  Seek care if pain does not improve over the next 1-2 weeks.    ED Prescriptions     Medication Sig Dispense Auth. Provider   tiZANidine  (ZANAFLEX ) 4 MG tablet Take 1 tablet (4 mg total) by mouth every 8 (eight) hours as needed for muscle spasms. Do not take with alcohol or while driving or operating heavy machinery.  May cause drowsiness. 30 tablet Chandra Harlene LABOR, NP      PDMP not reviewed this encounter.    [1]  Social History Tobacco Use   Smoking status: Former    Current packs/day: 0.00    Average packs/day: 1 pack/day for 10.0 years (10.0 ttl pk-yrs)    Types: Cigars, Cigarettes    Start date: 02/05/2011    Quit date: 02/04/2021    Years since quitting: 3.6   Smokeless tobacco: Never  Vaping Use   Vaping status: Never Used  Substance Use Topics   Alcohol use: Not Currently   Drug use: No     Chandra Harlene LABOR, NP 10/07/24 1350  "

## 2024-10-12 ENCOUNTER — Telehealth: Payer: Self-pay | Admitting: Oncology

## 2024-10-12 NOTE — Telephone Encounter (Signed)
 Called pt again and lvm to see how pt was and if he would like to r/s his appts again. Scheduling phone number provided
